# Patient Record
Sex: Female | Born: 1935 | Race: White | Hispanic: No | Smoking: Never smoker
Health system: Southern US, Community
[De-identification: ages and names within clinical notes are randomized; demographics above are authoritative.]

## PROBLEM LIST (undated history)

## (undated) DIAGNOSIS — K635 Polyp of colon: Secondary | ICD-10-CM

## (undated) DIAGNOSIS — I10 Essential (primary) hypertension: Secondary | ICD-10-CM

## (undated) DIAGNOSIS — K859 Acute pancreatitis without necrosis or infection, unspecified: Secondary | ICD-10-CM

## (undated) HISTORY — PX: POLYPECTOMY: SHX149

## (undated) HISTORY — PX: PARTIAL HYSTERECTOMY: SHX80

## (undated) HISTORY — PX: UPPER GASTROINTESTINAL ENDOSCOPY: SHX188

## (undated) HISTORY — DX: Acute pancreatitis without necrosis or infection, unspecified: K85.90

## (undated) HISTORY — PX: COLONOSCOPY: SHX174

## (undated) HISTORY — PX: WRIST FRACTURE SURGERY: SHX121

## (undated) HISTORY — PX: ERCP: SHX60

## (undated) HISTORY — PX: CHOLECYSTECTOMY: SHX55

## (undated) HISTORY — DX: Essential (primary) hypertension: I10

## (undated) HISTORY — DX: Polyp of colon: K63.5

---

## 1994-02-27 DIAGNOSIS — I10 Essential (primary) hypertension: Secondary | ICD-10-CM

## 1994-02-27 HISTORY — DX: Essential (primary) hypertension: I10

## 1997-07-20 ENCOUNTER — Ambulatory Visit (HOSPITAL_COMMUNITY): Admission: RE | Admit: 1997-07-20 | Discharge: 1997-07-20 | Payer: Self-pay | Admitting: Internal Medicine

## 1997-08-11 ENCOUNTER — Ambulatory Visit (HOSPITAL_COMMUNITY): Admission: RE | Admit: 1997-08-11 | Discharge: 1997-08-11 | Payer: Self-pay | Admitting: Family Medicine

## 1998-03-09 ENCOUNTER — Ambulatory Visit (HOSPITAL_COMMUNITY): Admission: RE | Admit: 1998-03-09 | Discharge: 1998-03-09 | Payer: Self-pay | Admitting: Family Medicine

## 1998-04-13 ENCOUNTER — Ambulatory Visit (HOSPITAL_COMMUNITY): Admission: RE | Admit: 1998-04-13 | Discharge: 1998-04-13 | Payer: Self-pay | Admitting: Family Medicine

## 1998-04-13 ENCOUNTER — Encounter: Payer: Self-pay | Admitting: Family Medicine

## 1998-04-22 ENCOUNTER — Ambulatory Visit (HOSPITAL_COMMUNITY): Admission: RE | Admit: 1998-04-22 | Discharge: 1998-04-22 | Payer: Self-pay | Admitting: Family Medicine

## 1998-10-14 ENCOUNTER — Ambulatory Visit (HOSPITAL_COMMUNITY): Admission: RE | Admit: 1998-10-14 | Discharge: 1998-10-14 | Payer: Self-pay | Admitting: Family Medicine

## 1998-10-14 ENCOUNTER — Encounter: Payer: Self-pay | Admitting: Family Medicine

## 1999-03-19 ENCOUNTER — Encounter: Payer: Self-pay | Admitting: Orthopedic Surgery

## 1999-03-19 ENCOUNTER — Encounter: Admission: RE | Admit: 1999-03-19 | Discharge: 1999-03-19 | Payer: Self-pay | Admitting: Orthopedic Surgery

## 1999-10-17 ENCOUNTER — Encounter: Payer: Self-pay | Admitting: Family Medicine

## 1999-10-17 ENCOUNTER — Ambulatory Visit (HOSPITAL_COMMUNITY): Admission: RE | Admit: 1999-10-17 | Discharge: 1999-10-17 | Payer: Self-pay | Admitting: Family Medicine

## 1999-11-04 ENCOUNTER — Other Ambulatory Visit: Admission: RE | Admit: 1999-11-04 | Discharge: 1999-11-04 | Payer: Self-pay | Admitting: Family Medicine

## 2000-10-17 ENCOUNTER — Encounter: Payer: Self-pay | Admitting: Family Medicine

## 2000-10-17 ENCOUNTER — Ambulatory Visit (HOSPITAL_COMMUNITY): Admission: RE | Admit: 2000-10-17 | Discharge: 2000-10-17 | Payer: Self-pay | Admitting: Family Medicine

## 2000-12-10 ENCOUNTER — Other Ambulatory Visit: Admission: RE | Admit: 2000-12-10 | Discharge: 2000-12-10 | Payer: Self-pay | Admitting: Family Medicine

## 2001-12-10 ENCOUNTER — Encounter: Payer: Self-pay | Admitting: Family Medicine

## 2001-12-10 ENCOUNTER — Ambulatory Visit (HOSPITAL_COMMUNITY): Admission: RE | Admit: 2001-12-10 | Discharge: 2001-12-10 | Payer: Self-pay | Admitting: Family Medicine

## 2002-07-16 ENCOUNTER — Encounter: Admission: RE | Admit: 2002-07-16 | Discharge: 2002-07-16 | Payer: Self-pay | Admitting: Family Medicine

## 2002-07-16 ENCOUNTER — Encounter: Payer: Self-pay | Admitting: Family Medicine

## 2002-12-16 ENCOUNTER — Other Ambulatory Visit: Admission: RE | Admit: 2002-12-16 | Discharge: 2002-12-16 | Payer: Self-pay | Admitting: Family Medicine

## 2003-04-03 ENCOUNTER — Emergency Department (HOSPITAL_COMMUNITY): Admission: EM | Admit: 2003-04-03 | Discharge: 2003-04-03 | Payer: Self-pay | Admitting: Emergency Medicine

## 2003-12-14 ENCOUNTER — Ambulatory Visit (HOSPITAL_COMMUNITY): Admission: RE | Admit: 2003-12-14 | Discharge: 2003-12-14 | Payer: Self-pay | Admitting: Radiation Oncology

## 2005-01-06 ENCOUNTER — Ambulatory Visit (HOSPITAL_COMMUNITY): Admission: RE | Admit: 2005-01-06 | Discharge: 2005-01-06 | Payer: Self-pay | Admitting: Family Medicine

## 2005-03-27 ENCOUNTER — Ambulatory Visit: Payer: Self-pay | Admitting: Internal Medicine

## 2005-04-11 ENCOUNTER — Encounter (INDEPENDENT_AMBULATORY_CARE_PROVIDER_SITE_OTHER): Payer: Self-pay | Admitting: *Deleted

## 2005-04-11 ENCOUNTER — Ambulatory Visit: Payer: Self-pay | Admitting: Internal Medicine

## 2006-01-08 ENCOUNTER — Ambulatory Visit (HOSPITAL_COMMUNITY): Admission: RE | Admit: 2006-01-08 | Discharge: 2006-01-08 | Payer: Self-pay | Admitting: Family Medicine

## 2006-11-12 ENCOUNTER — Emergency Department (HOSPITAL_COMMUNITY): Admission: EM | Admit: 2006-11-12 | Discharge: 2006-11-12 | Payer: Self-pay | Admitting: Emergency Medicine

## 2007-01-11 ENCOUNTER — Ambulatory Visit (HOSPITAL_COMMUNITY): Admission: RE | Admit: 2007-01-11 | Discharge: 2007-01-11 | Payer: Self-pay | Admitting: Family Medicine

## 2008-01-14 ENCOUNTER — Ambulatory Visit (HOSPITAL_COMMUNITY): Admission: RE | Admit: 2008-01-14 | Discharge: 2008-01-14 | Payer: Self-pay | Admitting: Family Medicine

## 2008-08-04 ENCOUNTER — Encounter: Admission: RE | Admit: 2008-08-04 | Discharge: 2008-08-04 | Payer: Self-pay | Admitting: Internal Medicine

## 2009-01-14 ENCOUNTER — Ambulatory Visit (HOSPITAL_COMMUNITY): Admission: RE | Admit: 2009-01-14 | Discharge: 2009-01-14 | Payer: Self-pay | Admitting: Internal Medicine

## 2009-05-25 ENCOUNTER — Encounter: Payer: Self-pay | Admitting: Internal Medicine

## 2009-05-25 ENCOUNTER — Ambulatory Visit: Payer: Self-pay | Admitting: Gastroenterology

## 2009-05-25 ENCOUNTER — Inpatient Hospital Stay (HOSPITAL_COMMUNITY): Admission: EM | Admit: 2009-05-25 | Discharge: 2009-05-29 | Payer: Self-pay | Admitting: Emergency Medicine

## 2009-05-26 ENCOUNTER — Encounter: Payer: Self-pay | Admitting: Internal Medicine

## 2009-05-31 ENCOUNTER — Telehealth: Payer: Self-pay | Admitting: Physician Assistant

## 2009-06-01 ENCOUNTER — Ambulatory Visit: Payer: Self-pay | Admitting: Internal Medicine

## 2009-06-01 DIAGNOSIS — I10 Essential (primary) hypertension: Secondary | ICD-10-CM | POA: Insufficient documentation

## 2009-06-01 DIAGNOSIS — E119 Type 2 diabetes mellitus without complications: Secondary | ICD-10-CM | POA: Insufficient documentation

## 2009-06-01 DIAGNOSIS — K859 Acute pancreatitis without necrosis or infection, unspecified: Secondary | ICD-10-CM | POA: Insufficient documentation

## 2009-06-01 DIAGNOSIS — E785 Hyperlipidemia, unspecified: Secondary | ICD-10-CM | POA: Insufficient documentation

## 2009-06-01 DIAGNOSIS — Z860100 Personal history of colon polyps, unspecified: Secondary | ICD-10-CM | POA: Insufficient documentation

## 2009-06-02 ENCOUNTER — Telehealth (INDEPENDENT_AMBULATORY_CARE_PROVIDER_SITE_OTHER): Payer: Self-pay | Admitting: *Deleted

## 2009-06-02 ENCOUNTER — Encounter (INDEPENDENT_AMBULATORY_CARE_PROVIDER_SITE_OTHER): Payer: Self-pay | Admitting: *Deleted

## 2009-06-02 LAB — CONVERTED CEMR LAB
ALT: 61 units/L — ABNORMAL HIGH (ref 0–35)
AST: 25 units/L (ref 0–37)
Albumin: 3.1 g/dL — ABNORMAL LOW (ref 3.5–5.2)
Alkaline Phosphatase: 99 units/L (ref 39–117)
Amylase: 26 units/L — ABNORMAL LOW (ref 27–131)
BUN: 13 mg/dL (ref 6–23)
Basophils Absolute: 0 10*3/uL (ref 0.0–0.1)
Basophils Relative: 0.2 % (ref 0.0–3.0)
CO2: 30 meq/L (ref 19–32)
Calcium: 9 mg/dL (ref 8.4–10.5)
Chloride: 96 meq/L (ref 96–112)
Creatinine, Ser: 1 mg/dL (ref 0.4–1.2)
Eosinophils Absolute: 0.1 10*3/uL (ref 0.0–0.7)
Eosinophils Relative: 1.3 % (ref 0.0–5.0)
GFR calc non Af Amer: 57.5 mL/min (ref >60)
Glucose, Bld: 287 mg/dL — ABNORMAL HIGH (ref 70–99)
HCT: 36.3 % (ref 36.0–46.0)
Hemoglobin: 12.3 g/dL (ref 12.0–15.0)
Lipase: 39 units/L (ref 11.0–59.0)
Lymphocytes Relative: 11.2 % — ABNORMAL LOW (ref 12.0–46.0)
Lymphs Abs: 1.2 10*3/uL (ref 0.7–4.0)
MCHC: 34 g/dL (ref 30.0–36.0)
MCV: 86.9 fL (ref 78.0–100.0)
Monocytes Absolute: 0.7 10*3/uL (ref 0.1–1.0)
Monocytes Relative: 6.5 % (ref 3.0–12.0)
Neutro Abs: 8.9 10*3/uL — ABNORMAL HIGH (ref 1.4–7.7)
Neutrophils Relative %: 80.8 % — ABNORMAL HIGH (ref 43.0–77.0)
Platelets: 339 10*3/uL (ref 150.0–400.0)
Potassium: 3.8 meq/L (ref 3.5–5.1)
RBC: 4.18 M/uL (ref 3.87–5.11)
RDW: 14.6 % (ref 11.5–14.6)
Sodium: 137 meq/L (ref 135–145)
Total Bilirubin: 0.6 mg/dL (ref 0.3–1.2)
Total Protein: 6.7 g/dL (ref 6.0–8.3)
WBC: 11 10*3/uL — ABNORMAL HIGH (ref 4.5–10.5)

## 2009-07-08 ENCOUNTER — Ambulatory Visit: Payer: Self-pay | Admitting: Gastroenterology

## 2009-07-08 ENCOUNTER — Ambulatory Visit (HOSPITAL_COMMUNITY): Admission: RE | Admit: 2009-07-08 | Discharge: 2009-07-08 | Payer: Self-pay | Admitting: Gastroenterology

## 2009-07-13 ENCOUNTER — Encounter: Payer: Self-pay | Admitting: Gastroenterology

## 2009-07-30 ENCOUNTER — Encounter (INDEPENDENT_AMBULATORY_CARE_PROVIDER_SITE_OTHER): Payer: Self-pay | Admitting: Surgery

## 2009-07-30 ENCOUNTER — Ambulatory Visit (HOSPITAL_COMMUNITY): Admission: RE | Admit: 2009-07-30 | Discharge: 2009-07-31 | Payer: Self-pay | Admitting: Surgery

## 2010-01-17 ENCOUNTER — Ambulatory Visit (HOSPITAL_COMMUNITY)
Admission: RE | Admit: 2010-01-17 | Discharge: 2010-01-17 | Payer: Self-pay | Source: Home / Self Care | Admitting: Family Medicine

## 2010-03-31 NOTE — Progress Notes (Signed)
Summary: triage  Phone Note Call from Patient Call back at Home Phone [phone]   Caller: Patient Call For: Mike Gip Reason for Call: Talk to Nurse Summary of Call: pt would like to sch an appt with Mike Gip for inflamed pancreas/hosp f/u Initial call taken by: Vallarie Mare,  May 31, 2009 8:43 AM  Follow-up for Phone Call        We scheduled the pt for TUes 06-01-09 at 10:30AM. per Amy Esterwood PA-C.  Pt informed, Amy spoke to pt on the phone. Follow-up by: Joselyn Glassman,  May 31, 2009 2:44 PM

## 2010-03-31 NOTE — Letter (Signed)
Summary: EGD Instructions  [facility]  [address] [place], [place] [address]   Phone: [phone]  Fax: [phone]       [name] [name]    May 21, 1932    MRN: [id number]       Procedure Day /Date:07/08/09  [name] [name]     Arrival Time: 6 am     Procedure Time:8 am     Location of Procedure:                      Please arrive at [facility] on 07/07/09 for preop at 1 pm.   X [facility] ( Outpatient Registration)    PREPARATION FOR ENDOSCOPY   On 07/08/09 THE DAY OF THE PROCEDURE:  1.   Nothing to eat or drink allowed after midnight the night before your procedure.    Unless otherwise instructed, you should take regular prescription medications with a small sip of water as early as possible the morning of your procedure.  Diabetic patients - see separate instructions.               OTHER INSTRUCTIONS  You will need a responsible adult at least 75 years of age to accompany you and drive you home.   This person must remain in the waiting room during your procedure.  Wear loose fitting clothing that is easily removed.  Leave jewelry and other valuables at home.  However, you may wish to bring a book to read or an iPod/MP3 player to listen to music as you wait for your procedure to start.  Remove all body piercing jewelry and leave at home.  Total time from sign-in until discharge is approximately 2-3 hours.  You should go home directly after your procedure and rest.  You can resume normal activities the day after your procedure.  The day of your procedure you should not:   Drive   Make legal decisions   Operate machinery   Drink alcohol   Return to work  You will receive specific instructions about eating, activities and medications before you leave.    The above instructions have been reviewed and explained to me by   Chales Abrahams CMA Duncan Dull)  June 02, 2009 8:17 AM      I fully understand and can verbalize these  instructions over the phone mailed to [facility] 06/02/09

## 2010-03-31 NOTE — Procedures (Signed)
Summary: CT ABD & PELVIS   CT Abdomen/Pelvis  Procedure date:  05/26/2009  Findings:      CT ABD & PELVIS 05-26-09 CT Abd/Pelvis WO/W CM - STATUS: Final  IMAGE                                     Perform Date: 30Mar11 14:32  Ordered By: Dennard Nip,         Ordered Date: [phone] 14:24  Facility: [facility]                              Department: CT  Service Report Text  [facility] Accession Number: [id number]      Clinical Data: Acute pancreatitis.  Cholelithiasis.  Prominent   pancreatic duct on ultrasound yesterday.    CT ABDOMEN AND PELVIS WITHOUT AND WITH CONTRAST    Technique:  Multidetector CT imaging of the abdomen and pelvis was   performed without contrast material in one or both body regions,   followed by contrast material(s) and further sections in one or   both body regions.    Contrast: 100 ml Omnipaque-300    Comparison: Ultrasound from 05/25/2009    Findings: Tiny bilateral pleural effusions noted.  The liver is   normal.  No intrahepatic biliary duct dilatation.  The spleen has   normal imaging features.  Stomach is unremarkable.  9 mm stone   identified in the gallbladder.  Extrahepatic common duct is   nondilated at 5 mm diameter level pancreatic head.    The main pancreatic duct is slightly prominent the pancreatic head   measuring 4 mm in diameter.  There is fullness of the uncinate   process and pancreatic head without a discrete mass visualized.   Edema/inflammation is noted around the pancreatic head and uncinate   process and edema is seen intercalated within the pancreatic   parenchyma.  No evidence for pseudocyst or abscess.  Superior   mesenteric vein, portal vein, and splenic vein are patent.    No adrenal mass.  Left kidney is normal.  Small cortical cyst noted   in the upper pole of the right kidney.    Bowel loops have normal imaging features.  No abdominal aortic   aneurysm.  No lymphadenopathy is identified in the abdomen or   pelvis.   Bladder is normal.  Uterus is surgically absent.  No   adnexal mass.  No evidence for diverticulitis.  Terminal ileum is   normal.  The appendix is normal.    Bone windows reveal no worrisome lytic or sclerotic osseous   lesions.    IMPRESSION:   Fullness in the pancreatic head and uncinate process is felt to be   edema related to acute pancreatitis.  There is no biliary   dilatation although the main pancreatic duct in the head of the   pancreas is slightly prominent.  No evidence for pseudocyst or   abscess at this time and no vascular complication is evident.   Consider repeat imaging after resolution of acute symptoms to   reassess the pancreatic head.    Read By:  Kennith Center,  M.D.   Released By:  Kennith Center,  M.D.  Additional Information  HL7 RESULT STATUS : F  External image : [id number]  External IF Update Timestamp : 2009-05-26:15:37:11.000000

## 2010-03-31 NOTE — Procedures (Signed)
Summary: ABD [place]   [place] of Abdomen  Procedure date:  05/25/2009  Findings:      ABD [place] 05/25/2009 US Abdomen Complete - STATUS: Final  IMAGE                                     Perform Date: 29Mar11 08:19  Ordered By: Olga Millers Date: 29Mar11 07:23  Facility: [facility]                              Department: [place]  Service Report Text  [facility] Accession Number: [id number]      Clinical Data:  Abdominal pain and nausea and vomiting.    COMPLETE ABDOMINAL ULTRASOUND    Comparison:  None.    Findings:    Gallbladder:  1.4 cm gallstone.  This changes slightly with change   of patient position.  No pericholecystic fluid noted.  Gallbladder   wall 2.2 mm.  The patient was not tender over this region during   scanning however, the patient was medicated and therefore this may   not be accurate.    Common bile duct:  6.3 mm.    Liver:  Diffuse increased echogenicity suggestive of fatty   infiltration.  Evaluation limited.    IVC:  Negative.    Pancreas:  Pancreatic duct is prominent at 3.9 mm with abrupt cut   off in the pancreatic neck region.  A discrete pancreatic mass is   not visualized at this level although evaluation is limited by   bowel gas and therefore underlying pancreatic lesion cannot be   excluded.    Spleen:  Small and difficult to assess measuring up to 3.6 cm in   length.    Right Kidney:  11.3 cm. No hydronephrosis or renal mass.    Left Kidney:  10.4 cm.  Dromedary hump. No hydronephrosis or renal   mass.    Abdominal aorta:  Evaluation is slightly limited by bowel gas.  No   abdominal aortic aneurysm detected of the visualized portions of   the aorta with maximal AP dimension 1.7 cm.    IMPRESSION:   1.4 cm gallstone.  No pericholecystic fluid or gallbladder wall   thickening.    Prominent pancreatic duct.  Although a discrete pancreatic mass as   cause of the pancreas duct prominence is not identified, evaluation   is limited  because of overlying bowel gas.    Fatty liver.  No obvious mass although evaluation limited given the   degree of increased echogenicity of the liver.    Read By:  Fuller Canada,  M.D.   Released By:  Fuller Canada,  M.D.  Additional Information  HL7 RESULT STATUS : F  External image : [id number]  External IF Update Timestamp : 2009-05-25:08:33:01.000000

## 2010-03-31 NOTE — Letter (Signed)
Summary: [facility]   Imported By: Sherian Rein 08/02/2009 14:07:51  _____________________________________________________________________  External Attachment:    Type:   Image     Comment:   External Document

## 2010-03-31 NOTE — Assessment & Plan Note (Signed)
Summary: F/U Post hospital, Pancreatitis   History of Present Illness Visit Type: Follow-up Visit Primary GI MD: Lina Sar MD Primary Provider: Jacqulyn Cane MD Chief Complaint: post hosp f/u pancreatitis History of Present Illness:   PLEASANT 77 Y.O FEMALE WHO WAS JUST HOSPITALIZED LAST WEEK AT [facility], AND EVALUATED  BY DR. KAPLAN AND DR Gerrit Friends. SHE HAD ACUTE PANCREATITIS,ETIOLOGY NOT ENTIRELY CLEAR. SHE DOES HAVE GALLSTONES -MAY HAVE PASSED A STONE THOUGH ALL STUDIES NOT CONSISTENT. SHE ALSO HAS AN ABNORMAL [place] AND CT SHOWING ENLARGEMENT OF THE PANREATIC DUCT, AND AN ABRUPT CUT OFF OF DUCT IN THE PANCREATIC HEAD. CA19-9 MILDLY ELEVATED IN 47 RANGE. SHE RECOVERED SLOWLY AND DECISION MADE TO WAIT ON SURGERY , AND FURTHER STUDIES UNTIL PANCREATITIS COMPLETELY RESOLVED- THEN EUS/?MRCP. SHE CALLED YESTERDAY , NOT FEELING AS WELL, AND UPSET AS SHE HAD BEEN DISCHARGED BY SURGEON OVER THE WEEKEND SOONER THAN EXPECTED. SHE IS REQUIRING INTERMITTENT VICODEN,STILL HAS SOME PAIN THOUGH RATES IT AS 3-4/10. +NAUSEA, NO VOMITING, NOT MUCH APPETITE BUT EATING.HAS HAD SOME LOW GRADE FEVERS ,99 RANGE.BMS NORMAL. NO SOB.   GI Review of Systems    Reports abdominal pain, loss of appetite, and  nausea.     Location of  Abdominal pain: upper abdomen.    Denies acid reflux, belching, bloating, chest pain, dysphagia with liquids, dysphagia with solids, heartburn, vomiting, vomiting blood, and  weight loss.        Denies anal fissure, black tarry stools, change in bowel habit, constipation, diarrhea, diverticulosis, fecal incontinence, heme positive stool, hemorrhoids, irritable bowel syndrome, jaundice, light color stool, liver problems, rectal bleeding, and  rectal pain. Preventive Screening-Counseling & Management  Alcohol-Tobacco     Smoking Status: never  Caffeine-Diet-Exercise     Does Patient Exercise: no      Drug Use:  no.      Current Medications (verified): 1)  Glucotrol Xl 10 Mg Xr24h-Tab  (Glipizide) .Marland Kitchen.. 1 By Mouth Two Times A Day 2)  Glucophage 1000 Mg Tabs (Metformin Hcl) .Marland Kitchen.. 1 By Mouth Two Times A Day 3)  Lantus 100 Unit/ml Soln (Insulin Glargine) .... 23 Units At Bedtime 4)  Pravastatin Sodium 20 Mg Tabs (Pravastatin Sodium) .Marland Kitchen.. 1 By Mouth Once Daily 5)  Diltzac 360 Mg Xr24h-Cap (Diltiazem Hcl Er Beads) .Marland Kitchen.. 1 By Mouth Once Daily 6)  Accupril 20 Mg Tabs (Quinapril Hcl) .Marland Kitchen.. 1 By Mouth Two Times A Day 7)  Furosemide 20 Mg Tabs (Furosemide) .Marland Kitchen.. 1 By Mouth Once Daily 8)  Aspir-Low 81 Mg Tbec (Aspirin) .Marland Kitchen.. 1 By Mouth Once Daily 9)  Citracal Petites/vitamin D 200-250 Mg-Unit Tabs (Calcium Citrate-Vitamin D) .Marland Kitchen.. 1 By Mouth Two Times A Day 10)  Hydrocodone-Acetaminophen 5-500 Mg Tabs (Hydrocodone-Acetaminophen) .... As Needed  Allergies (verified): No Known Drug Allergies  Past History:  Past Medical History: Diabetes Hyperlipidemia Hypertension ACUTE PANCREATITIS 3/11 CHOLELITHIASIS  Past Surgical History: Hysterectomy wrist surgery  Family History: Lung Cancer:  Brother  No FH of Colon Cancer: Family History of Heart Disease: brother Family History of Diabetes: brother Family History of Kidney Disease:mother  Social History: Patient has never smoked. ,WIDOWED THIS PAST YEAR Alcohol Use - no Daily Caffeine Use 2 per day Illicit Drug Use - no Patient does not get regular exercise.  Smoking Status:  never Drug Use:  no Does Patient Exercise:  no  Review of Systems  The patient denies allergy/sinus, anemia, anxiety-new, arthritis/joint pain, back pain, blood in urine, breast changes/lumps, change in vision, confusion, cough, coughing up blood, depression-new, fainting,  fatigue, fever, headaches-new, hearing problems, heart murmur, heart rhythm changes, itching, menstrual pain, muscle pains/cramps, night sweats, nosebleeds, pregnancy symptoms, shortness of breath, skin rash, sleeping problems, sore throat, swelling of feet/legs, swollen lymph glands,  thirst - excessive , urination - excessive , urination changes/pain, urine leakage, vision changes, and voice change.         ROS OTHERWISE AS IN HPI  Vital Signs:  Patient profile:   77 year old female Height:      66 inches Weight:      183 pounds BMI:     29.64 Pulse rate:   70 / minute Pulse rhythm:   regular BP sitting:   120 / 44  (left arm)  Vitals Entered By: Chales Abrahams CMA Duncan Dull) (June 01, 2009 10:38 AM)  Physical Exam  General:  Well developed, well nourished, no acute distress. Head:  Normocephalic and atraumatic. Eyes:  PERRLA, no icterus. Lungs:  Clear throughout to auscultation. Heart:  Regular rate and rhythm; no murmurs, rubs,  or bruits. Abdomen:  SOFT, MILD TENDERNESS UPPER ABDOMEN, NO GUARDING, NO REBOUND, BS+ Rectal:  NOT DONE Extremities:  1+ pedal edema.   Neurologic:  Alert and  oriented x4;  grossly normal neurologically. Psych:  Alert and cooperative. Normal mood and affect.anxious.     Impression & Recommendations:  Problem # 1:  ACUTE PANCREATITS 577.0 Assessment New 76 YO FEMALE WITH RESOLVING ACUTE PANCREATITIS,POSSIBLY BILIARY THOUGH CONCERNED ABOUT SUBTLE PANCREATIC HEAD NEOPLASM.  LABS TODAY AS BELOW CONTINUE LOW FAT DIET DISCUSSED EUS AND ERCP WITH PT AND HER SISTER. THIS HAD ASLO BEEN DISCUSSED DURING HER HOSPITALIZATION. WILL DISCUSS WITH DR. Christella Hartigan AND SCHEDULE FOR 3-4 WEEKS FORM NOW.  Problem # 2:  CHOLELITHIASIS (ICD-574.2) Assessment: Comment Only  Problem # 3:  HYPERTENSION (ICD-401.9) Assessment: Comment Only  Problem # 4:  HYPERLIPIDEMIA (ICD-272.4) Assessment: Comment Only  Problem # 5:  DIABETES MELLITUS-TYPE II (ICD-250.00) Assessment: Comment Only  Problem # 6:  PERSONAL HX COLONIC POLYPS (ICD-V12.72) Assessment: Comment Only ADENOMATOUS,DUE FOR FOLLOW UP 03/2010  Other Orders: TLB-CBC Platelet - w/Differential (85025-CBCD) TLB-CMP (Comprehensive Metabolic Pnl) (80053-COMP) TLB-Amylase (82150-AMYL) TLB-Lipase  (83690-LIPASE)  Patient Instructions: 1)  Your physician has requested that you have the following labwork done today: Go to our basement level. 2)  Patty will be calling you with an appointment for a procedure with Dr. Rob Bunting. 3)  Copy sent to : Kern Reap, MD 4)  The medication list was reviewed and reconciled.  All changed / newly prescribed medications were explained.  A complete medication list was provided to the patient / caregiver.  Appended Document: F/U Post hospital, Pancreatitis i reviewed CT images, [place] report.  recent labs while in hosp.  Looks like biliary pancreatitis (transaminases had signficant, transient elevation; amylase >2000 at one point).  There is a large gallstone in GB, perhaps smaller sludge, stone debris caused the pancreaittis. Assessing for a mass lesion in setting of acute pancreaitis is difficult by [place] and CT and so I think proceed with EUS +/- ERCP in 4-5 weeks is best plan.  I will arrange for this.  patty, can you set her up with 90 min upper EUS, radial; +/- ERCP.  Will need propofol sedation, thursday May 12th, diagnosis recent acute pancreatitis, abn pancreas on imaging.  Appended Document: F/U Post hospital, Pancreatitis pt appt scheduled

## 2010-03-31 NOTE — Progress Notes (Signed)
Summary: EUS  Phone Note Outgoing Call Call back at Home Phone [phone]   Call placed by: Chales Abrahams CMA Duncan Dull),  June 02, 2009 8:12 AM Summary of Call: EUS ERCP scheduled review meds instruct pt.  pt to have preop 07/07/09  1 pm  Initial call taken by: Chales Abrahams CMA Duncan Dull),  June 02, 2009 8:13 AM  Follow-up for Phone Call        I gave pt her lab results and let her know about the Preop appt for the EUS, she is to be @ [facility] on 07-07-09 at 1:00 PM.  The EUS is scheduled for 07-08-09 and she is to be there at 6:00 Am.  I advised her Chales Abrahams CMA will be mailing her instructions to her for this procedure.  Follow-up by: Joselyn Glassman,  June 02, 2009 11:48 AM

## 2010-03-31 NOTE — Letter (Signed)
Summary: Diabetic Instructions  [facility]  [address] [place], [place] [address]   Phone: [phone]  Fax: [phone]    [name] [name] Apr 12, 1932 MRN: [id number]   X   ORAL DIABETIC MEDICATION INSTRUCTIONS  The day before your procedure:   Take your diabetic pill as you do normally  The day of your procedure:   Do not take your diabetic pill    We will check your blood sugar levels during the admission process and again in Recovery before discharging you home  ________________________________________________________________________  X   INSULIN (LONG ACTING) MEDICATION INSTRUCTIONS (Lantus, NPH, 70/30, Humulin, Novolin-N)   The day before your procedure:   Take  your regular evening dose    The day of your procedure:   Do not take your morning dose

## 2010-03-31 NOTE — Procedures (Signed)
Summary: Endoscopic Ultrasound  Patient: [name] Note: All result statuses are Final unless otherwise noted.  Tests: (1) Endoscopic Ultrasound (EUS)  EUS Endoscopic Ultrasound                             DONE     [facility]     [address] [place], [place]  [address]           ENDOSCOPIC ULTRASOUND PROCEDURE REPORT           PATIENT:  [name] [name], [name] [name]  MR#:  [id number]     BIRTHDATE:  1932-11-16  GENDER:  female     ENDOSCOPIST:  Rachael Fee, MD     REFERRED BY:  Barbette Hair. Arlyce Dice, M.D.     PROCEDURE DATE:  07/08/2009     PROCEDURE:  Upper EUS     ASA CLASS:  Class II     INDICATIONS:  recent acute pancreatitis; gallstone in GB on [place]; CT     showed "fullness in head/uncinate.slightly dilated PD," seen by     surgery (DR. Gerkin) and GI (Dr. Arlyce Dice) in hospital     MEDICATIONS:  MAC sedation, administered by CRNA           DESCRIPTION OF PROCEDURE:   After the risks, benefits, and     alternatives of the procedure were thoroughly explained, informed     consent was obtained.  The  endoscope was introduced through the     mouth and advanced to the duodenum.     <<PROCEDUREIMAGES>>           Endoscopic findings (limited views with radial echoendoscope):     1. Normal esophagus     2. Normal stomach     3. Normal duodenum           EUS findings:     1. CBD was normal, non-dilated and contained no filling defects     2. Main pancreatic duct slightly dilated in head, tapers normally     throughout neck, body, tail.     3. Normal pancreatic parenchyma; no masses, no signs of chronic     pancreatitis.     4. No peripancreatic or celiac adenopathy.     5. Gallbladder contained one 11mm, hyperechoic, shadowing stone.     6. Limited views of liver, spleen, portal and splenic vessels were     all normal           Impression:     No CBD stones.  + 11mm shadowing stone in gallbladder.     My office will make sure she returns to see Dr. Darnell Level,  whom     she saw while in hospital 1 month ago, to consider     cholecystectomy.           ______________________________     Rachael Fee, MD           cc: Darnell Level, MD           n.     Rosalie Doctor:   Rachael Fee at 07/08/2009 08:43 AM           Darnelle Bos, [id number]  Note: An exclamation mark (!) indicates a result that was not dispersed into the flowsheet. Document Creation Date: 07/08/2009 8:43 AM _______________________________________________________________________  (1) Order result status: Final Collection or observation date-time: 07/08/2009 08:31 Requested date-time:  Receipt  date-time:  Reported date-time:  Referring Physician:   Ordering Physician: Rob Bunting [phone]) Specimen Source:  Source: Launa Grill Order Number: [id number] Lab site:   Appended Document: Endoscopic Ultrasound patty,  can you make sure that she has rov with Dr. Darnell Level at [facility] (he saw her in hospital), needs to consider cholecystectomy.  Appended Document: Endoscopic Ultrasound spoke with Darel Hong at [facility] and the pt scheduled her appt for the 17th she is aware.

## 2010-04-14 ENCOUNTER — Encounter: Payer: Self-pay | Admitting: Internal Medicine

## 2010-04-20 NOTE — Letter (Signed)
Summary: Colonoscopy Letter  [facility]  [address] [place], [place] [address]   Phone: [phone]  Fax: [phone]      April 14, 2010 MRN: [id number]   [name] [name] [address] [place], [place]  [address]   Dear Ms. Sharber,   According to your medical record, it is time for you to schedule a Colonoscopy. The American Cancer Society recommends this procedure as a method to detect early colon cancer. Patients with a family history of colon cancer, or a personal history of colon polyps or inflammatory bowel disease are at increased risk.  This letter has been generated based on the recommendations made at the time of your procedure. If you feel that in your particular situation this may no longer apply, please contact our office.  Please call our office at [phone] to schedule this appointment or to update your records at your earliest convenience.  Thank you for cooperating with [place] to provide you with the very best care possible.   Sincerely,  Hedwig Morton. Juanda Chance, M.D.  [facility] Gastroenterology Division [phone]

## 2010-04-25 ENCOUNTER — Encounter (INDEPENDENT_AMBULATORY_CARE_PROVIDER_SITE_OTHER): Payer: Self-pay | Admitting: *Deleted

## 2010-05-05 NOTE — Letter (Signed)
Summary: Pre Visit Letter Revised  [facility]  [address] [place], [place] [address]   Phone: [phone]  Fax: [phone]        04/25/2010 MRN: [id number] [name] [name] [address] [place], [place]  [address]             Procedure Date:  05-31-10           Recall Colon--Dr. Juanda Chance      Welcome to the Gastroenterology Division at [facility].    You are scheduled to see a nurse for your pre-procedure visit on 05-17-10 at 1:00P.M. on the 3rd floor at [facility], [address]  We ask that you try to arrive at our office 15 minutes prior to your appointment time to allow for check-in.  Please take a minute to review the attached form.  If you answer "Yes" to one or more of the questions on the first page, we ask that you call the person listed at your earliest opportunity.  If you answer "No" to all of the questions, please complete the rest of the form and bring it to your appointment.    Your nurse visit will consist of discussing your medical and surgical history, your immediate family medical history, and your medications.   If you are unable to list all of your medications on the form, please bring the medication bottles to your appointment and we will list them.  We will need to be aware of both prescribed and over the counter drugs.  We will need to know exact dosage information as well.    Please be prepared to read and sign documents such as consent forms, a financial agreement, and acknowledgement forms.  If necessary, and with your consent, a friend or relative is welcome to sit-in on the nurse visit with you.  Please bring your insurance card so that we may make a copy of it.  If your insurance requires a referral to see a specialist, please bring your referral form from your primary care physician.  No co-pay is required for this nurse visit.     If you cannot keep your appointment, please call [phone] to cancel or reschedule prior to  your appointment date.  This allows [place] the opportunity to schedule an appointment for another patient in need of care.    Thank you for choosing [facility] for your medical needs.  We appreciate the opportunity to care for you.  Please visit [place] at our website  to learn more about our practice.  Sincerely, The Gastroenterology Division

## 2010-05-16 LAB — COMPREHENSIVE METABOLIC PANEL
ALT: 13 U/L (ref 0–35)
AST: 16 U/L (ref 0–37)
Albumin: 3.9 g/dL (ref 3.5–5.2)
Alkaline Phosphatase: 67 U/L (ref 39–117)
BUN: 18 mg/dL (ref 6–23)
CO2: 31 mEq/L (ref 19–32)
Calcium: 9 mg/dL (ref 8.4–10.5)
Chloride: 99 mEq/L (ref 96–112)
Creatinine, Ser: 0.87 mg/dL (ref 0.4–1.2)
GFR calc Af Amer: >60 mL/min (ref >60)
GFR calc non Af Amer: >60 mL/min (ref >60)
Glucose, Bld: 226 mg/dL — ABNORMAL HIGH (ref 70–99)
Potassium: 3.9 mEq/L (ref 3.5–5.1)
Sodium: 139 mEq/L (ref 135–145)
Total Bilirubin: 0.4 mg/dL (ref 0.3–1.2)
Total Protein: 6.9 g/dL (ref 6.0–8.3)

## 2010-05-16 LAB — GLUCOSE, CAPILLARY
Glucose-Capillary: 182 mg/dL — ABNORMAL HIGH (ref 70–99)
Glucose-Capillary: 186 mg/dL — ABNORMAL HIGH (ref 70–99)
Glucose-Capillary: 191 mg/dL — ABNORMAL HIGH (ref 70–99)
Glucose-Capillary: 223 mg/dL — ABNORMAL HIGH (ref 70–99)
Glucose-Capillary: 248 mg/dL — ABNORMAL HIGH (ref 70–99)
Glucose-Capillary: 272 mg/dL — ABNORMAL HIGH (ref 70–99)

## 2010-05-16 LAB — URINALYSIS, ROUTINE W REFLEX MICROSCOPIC
Bilirubin Urine: NEGATIVE
Glucose, UA: NEGATIVE mg/dL
Hgb urine dipstick: NEGATIVE
Ketones, ur: NEGATIVE mg/dL
Nitrite: NEGATIVE
Protein, ur: NEGATIVE mg/dL
Specific Gravity, Urine: 1.01 (ref 1.005–1.030)
Urobilinogen, UA: 0.2 mg/dL (ref 0.0–1.0)
pH: 5.5 (ref 5.0–8.0)

## 2010-05-16 LAB — DIFFERENTIAL
Basophils Absolute: 0 10*3/uL (ref 0.0–0.1)
Basophils Relative: 0 % (ref 0–1)
Eosinophils Absolute: 0.1 10*3/uL (ref 0.0–0.7)
Eosinophils Relative: 1 % (ref 0–5)
Lymphocytes Relative: 15 % (ref 12–46)
Lymphs Abs: 1.5 10*3/uL (ref 0.7–4.0)
Monocytes Absolute: 0.7 10*3/uL (ref 0.1–1.0)
Monocytes Relative: 7 % (ref 3–12)
Neutro Abs: 7.3 10*3/uL (ref 1.7–7.7)
Neutrophils Relative %: 76 % (ref 43–77)

## 2010-05-16 LAB — CBC
HCT: 40.1 % (ref 36.0–46.0)
Hemoglobin: 13 g/dL (ref 12.0–15.0)
MCHC: 32.4 g/dL (ref 30.0–36.0)
MCV: 88.8 fL (ref 78.0–100.0)
Platelets: 273 10*3/uL (ref 150–400)
RBC: 4.52 MIL/uL (ref 3.87–5.11)
RDW: 14.6 % (ref 11.5–15.5)
WBC: 9.6 10*3/uL (ref 4.0–10.5)

## 2010-05-16 LAB — PROTIME-INR
INR: 0.97 (ref 0.00–1.49)
Prothrombin Time: 12.8 seconds (ref 11.6–15.2)

## 2010-05-17 ENCOUNTER — Ambulatory Visit (AMBULATORY_SURGERY_CENTER): Payer: Medicare Other

## 2010-05-17 VITALS — Ht 66.0 in | Wt 189.5 lb

## 2010-05-17 DIAGNOSIS — Z1211 Encounter for screening for malignant neoplasm of colon: Secondary | ICD-10-CM

## 2010-05-17 LAB — GLUCOSE, CAPILLARY: Glucose-Capillary: 238 mg/dL — ABNORMAL HIGH (ref 70–99)

## 2010-05-17 MED ORDER — PEG-KCL-NACL-NASULF-NA ASC-C 100 G PO SOLR: 1.0000 | Freq: Once | ORAL | Status: AC

## 2010-05-17 NOTE — Patient Instructions (Signed)
Pt to pick up Moviprep with in 5 days

## 2010-05-17 NOTE — Progress Notes (Deleted)
  Subjective:    Patient ID: [name] [name], female    DOB: [birth year], 77 y.o.   MRN: [id number]  HPI    Review of Systems     Objective:   Physical Exam        Assessment & Plan:

## 2010-05-18 LAB — GLUCOSE, CAPILLARY
Glucose-Capillary: 223 mg/dL — ABNORMAL HIGH (ref 70–99)
Glucose-Capillary: 230 mg/dL — ABNORMAL HIGH (ref 70–99)
Glucose-Capillary: 238 mg/dL — ABNORMAL HIGH (ref 70–99)
Glucose-Capillary: 269 mg/dL — ABNORMAL HIGH (ref 70–99)
Glucose-Capillary: 273 mg/dL — ABNORMAL HIGH (ref 70–99)
Glucose-Capillary: 287 mg/dL — ABNORMAL HIGH (ref 70–99)
Glucose-Capillary: 297 mg/dL — ABNORMAL HIGH (ref 70–99)
Glucose-Capillary: 323 mg/dL — ABNORMAL HIGH (ref 70–99)

## 2010-05-18 LAB — COMPREHENSIVE METABOLIC PANEL
ALT: 140 U/L — ABNORMAL HIGH (ref 0–35)
ALT: 220 U/L — ABNORMAL HIGH (ref 0–35)
AST: 18 U/L (ref 0–37)
AST: 34 U/L (ref 0–37)
Albumin: 2.7 g/dL — ABNORMAL LOW (ref 3.5–5.2)
Albumin: 3 g/dL — ABNORMAL LOW (ref 3.5–5.2)
Alkaline Phosphatase: 102 U/L (ref 39–117)
Alkaline Phosphatase: 127 U/L — ABNORMAL HIGH (ref 39–117)
BUN: 5 mg/dL — ABNORMAL LOW (ref 6–23)
BUN: 7 mg/dL (ref 6–23)
CO2: 26 mEq/L (ref 19–32)
CO2: 29 mEq/L (ref 19–32)
Calcium: 7.8 mg/dL — ABNORMAL LOW (ref 8.4–10.5)
Calcium: 7.9 mg/dL — ABNORMAL LOW (ref 8.4–10.5)
Chloride: 97 mEq/L (ref 96–112)
Chloride: 98 mEq/L (ref 96–112)
Creatinine, Ser: 0.72 mg/dL (ref 0.4–1.2)
Creatinine, Ser: 0.8 mg/dL (ref 0.4–1.2)
GFR calc Af Amer: >60 mL/min (ref >60)
GFR calc Af Amer: >60 mL/min (ref >60)
GFR calc non Af Amer: >60 mL/min (ref >60)
GFR calc non Af Amer: >60 mL/min (ref >60)
Glucose, Bld: 260 mg/dL — ABNORMAL HIGH (ref 70–99)
Glucose, Bld: 288 mg/dL — ABNORMAL HIGH (ref 70–99)
Potassium: 3.6 mEq/L (ref 3.5–5.1)
Potassium: 3.6 mEq/L (ref 3.5–5.1)
Sodium: 131 mEq/L — ABNORMAL LOW (ref 135–145)
Sodium: 134 mEq/L — ABNORMAL LOW (ref 135–145)
Total Bilirubin: 1.6 mg/dL — ABNORMAL HIGH (ref 0.3–1.2)
Total Bilirubin: 1.6 mg/dL — ABNORMAL HIGH (ref 0.3–1.2)
Total Protein: 6 g/dL (ref 6.0–8.3)
Total Protein: 6.3 g/dL (ref 6.0–8.3)

## 2010-05-18 LAB — CBC
HCT: 35 % — ABNORMAL LOW (ref 36.0–46.0)
HCT: 35.1 % — ABNORMAL LOW (ref 36.0–46.0)
Hemoglobin: 11.5 g/dL — ABNORMAL LOW (ref 12.0–15.0)
Hemoglobin: 11.6 g/dL — ABNORMAL LOW (ref 12.0–15.0)
MCHC: 32.9 g/dL (ref 30.0–36.0)
MCHC: 33 g/dL (ref 30.0–36.0)
MCV: 88.1 fL (ref 78.0–100.0)
MCV: 88.5 fL (ref 78.0–100.0)
Platelets: 210 10*3/uL (ref 150–400)
Platelets: 227 10*3/uL (ref 150–400)
RBC: 3.96 MIL/uL (ref 3.87–5.11)
RBC: 3.97 MIL/uL (ref 3.87–5.11)
RDW: 14.2 % (ref 11.5–15.5)
RDW: 14.6 % (ref 11.5–15.5)
WBC: 12.6 10*3/uL — ABNORMAL HIGH (ref 4.0–10.5)
WBC: 15.1 10*3/uL — ABNORMAL HIGH (ref 4.0–10.5)

## 2010-05-18 LAB — LIPASE, BLOOD: Lipase: 32 U/L (ref 11–59)

## 2010-05-23 LAB — CBC
HCT: 33.3 % — ABNORMAL LOW (ref 36.0–46.0)
HCT: 36.7 % (ref 36.0–46.0)
HCT: 39.8 % (ref 36.0–46.0)
Hemoglobin: 11 g/dL — ABNORMAL LOW (ref 12.0–15.0)
Hemoglobin: 12 g/dL (ref 12.0–15.0)
Hemoglobin: 13.1 g/dL (ref 12.0–15.0)
MCHC: 32.6 g/dL (ref 30.0–36.0)
MCHC: 33 g/dL (ref 30.0–36.0)
MCHC: 33 g/dL (ref 30.0–36.0)
MCV: 87.4 fL (ref 78.0–100.0)
MCV: 89 fL (ref 78.0–100.0)
MCV: 89.2 fL (ref 78.0–100.0)
Platelets: 191 10*3/uL (ref 150–400)
Platelets: 256 10*3/uL (ref 150–400)
Platelets: 308 10*3/uL (ref 150–400)
RBC: 3.74 MIL/uL — ABNORMAL LOW (ref 3.87–5.11)
RBC: 4.12 MIL/uL (ref 3.87–5.11)
RBC: 4.55 MIL/uL (ref 3.87–5.11)
RDW: 13.3 % (ref 11.5–15.5)
RDW: 14.4 % (ref 11.5–15.5)
RDW: 14.6 % (ref 11.5–15.5)
WBC: 13.2 10*3/uL — ABNORMAL HIGH (ref 4.0–10.5)
WBC: 17.6 10*3/uL — ABNORMAL HIGH (ref 4.0–10.5)
WBC: 18.1 10*3/uL — ABNORMAL HIGH (ref 4.0–10.5)

## 2010-05-23 LAB — GLUCOSE, CAPILLARY
Glucose-Capillary: 221 mg/dL — ABNORMAL HIGH (ref 70–99)
Glucose-Capillary: 243 mg/dL — ABNORMAL HIGH (ref 70–99)
Glucose-Capillary: 255 mg/dL — ABNORMAL HIGH (ref 70–99)
Glucose-Capillary: 260 mg/dL — ABNORMAL HIGH (ref 70–99)
Glucose-Capillary: 262 mg/dL — ABNORMAL HIGH (ref 70–99)
Glucose-Capillary: 275 mg/dL — ABNORMAL HIGH (ref 70–99)
Glucose-Capillary: 282 mg/dL — ABNORMAL HIGH (ref 70–99)
Glucose-Capillary: 284 mg/dL — ABNORMAL HIGH (ref 70–99)
Glucose-Capillary: 285 mg/dL — ABNORMAL HIGH (ref 70–99)
Glucose-Capillary: 290 mg/dL — ABNORMAL HIGH (ref 70–99)
Glucose-Capillary: 294 mg/dL — ABNORMAL HIGH (ref 70–99)
Glucose-Capillary: 295 mg/dL — ABNORMAL HIGH (ref 70–99)
Glucose-Capillary: 309 mg/dL — ABNORMAL HIGH (ref 70–99)
Glucose-Capillary: 312 mg/dL — ABNORMAL HIGH (ref 70–99)
Glucose-Capillary: 389 mg/dL — ABNORMAL HIGH (ref 70–99)

## 2010-05-23 LAB — COMPREHENSIVE METABOLIC PANEL
ALT: 295 U/L — ABNORMAL HIGH (ref 0–35)
ALT: 306 U/L — ABNORMAL HIGH (ref 0–35)
ALT: 501 U/L — ABNORMAL HIGH (ref 0–35)
ALT: 615 U/L — ABNORMAL HIGH (ref 0–35)
AST: 1076 U/L — ABNORMAL HIGH (ref 0–37)
AST: 442 U/L — ABNORMAL HIGH (ref 0–37)
AST: 76 U/L — ABNORMAL HIGH (ref 0–37)
AST: 84 U/L — ABNORMAL HIGH (ref 0–37)
Albumin: 2.8 g/dL — ABNORMAL LOW (ref 3.5–5.2)
Albumin: 2.9 g/dL — ABNORMAL LOW (ref 3.5–5.2)
Albumin: 3.4 g/dL — ABNORMAL LOW (ref 3.5–5.2)
Albumin: 4 g/dL (ref 3.5–5.2)
Alkaline Phosphatase: 103 U/L (ref 39–117)
Alkaline Phosphatase: 103 U/L (ref 39–117)
Alkaline Phosphatase: 107 U/L (ref 39–117)
Alkaline Phosphatase: 94 U/L (ref 39–117)
BUN: 10 mg/dL (ref 6–23)
BUN: 11 mg/dL (ref 6–23)
BUN: 15 mg/dL (ref 6–23)
BUN: 9 mg/dL (ref 6–23)
CO2: 26 mEq/L (ref 19–32)
CO2: 26 mEq/L (ref 19–32)
CO2: 28 mEq/L (ref 19–32)
CO2: 29 mEq/L (ref 19–32)
Calcium: 7.8 mg/dL — ABNORMAL LOW (ref 8.4–10.5)
Calcium: 7.8 mg/dL — ABNORMAL LOW (ref 8.4–10.5)
Calcium: 8 mg/dL — ABNORMAL LOW (ref 8.4–10.5)
Calcium: 9 mg/dL (ref 8.4–10.5)
Chloride: 103 mEq/L (ref 96–112)
Chloride: 105 mEq/L (ref 96–112)
Chloride: 106 mEq/L (ref 96–112)
Chloride: 107 mEq/L (ref 96–112)
Creatinine, Ser: 0.75 mg/dL (ref 0.4–1.2)
Creatinine, Ser: 0.86 mg/dL (ref 0.4–1.2)
Creatinine, Ser: 0.86 mg/dL (ref 0.4–1.2)
Creatinine, Ser: 0.89 mg/dL (ref 0.4–1.2)
GFR calc Af Amer: >60 mL/min (ref >60)
GFR calc Af Amer: >60 mL/min (ref >60)
GFR calc Af Amer: >60 mL/min (ref >60)
GFR calc Af Amer: >60 mL/min (ref >60)
GFR calc non Af Amer: >60 mL/min (ref >60)
GFR calc non Af Amer: >60 mL/min (ref >60)
GFR calc non Af Amer: >60 mL/min (ref >60)
GFR calc non Af Amer: >60 mL/min (ref >60)
Glucose, Bld: 292 mg/dL — ABNORMAL HIGH (ref 70–99)
Glucose, Bld: 292 mg/dL — ABNORMAL HIGH (ref 70–99)
Glucose, Bld: 293 mg/dL — ABNORMAL HIGH (ref 70–99)
Glucose, Bld: 301 mg/dL — ABNORMAL HIGH (ref 70–99)
Potassium: 3.8 mEq/L (ref 3.5–5.1)
Potassium: 4.5 mEq/L (ref 3.5–5.1)
Potassium: 4.6 mEq/L (ref 3.5–5.1)
Potassium: 4.6 mEq/L (ref 3.5–5.1)
Sodium: 136 mEq/L (ref 135–145)
Sodium: 137 mEq/L (ref 135–145)
Sodium: 139 mEq/L (ref 135–145)
Sodium: 141 mEq/L (ref 135–145)
Total Bilirubin: 1.4 mg/dL — ABNORMAL HIGH (ref 0.3–1.2)
Total Bilirubin: 1.5 mg/dL — ABNORMAL HIGH (ref 0.3–1.2)
Total Bilirubin: 1.8 mg/dL — ABNORMAL HIGH (ref 0.3–1.2)
Total Bilirubin: 3.2 mg/dL — ABNORMAL HIGH (ref 0.3–1.2)
Total Protein: 5.8 g/dL — ABNORMAL LOW (ref 6.0–8.3)
Total Protein: 5.9 g/dL — ABNORMAL LOW (ref 6.0–8.3)
Total Protein: 6.4 g/dL (ref 6.0–8.3)
Total Protein: 7.2 g/dL (ref 6.0–8.3)

## 2010-05-23 LAB — AMYLASE
Amylase: 297 U/L — ABNORMAL HIGH (ref 0–105)
Amylase: 43 U/L (ref 0–105)
Amylase: 49 U/L (ref 0–105)

## 2010-05-23 LAB — DIFFERENTIAL
Basophils Absolute: 0.1 10*3/uL (ref 0.0–0.1)
Basophils Relative: 0 % (ref 0–1)
Eosinophils Absolute: 0 10*3/uL (ref 0.0–0.7)
Eosinophils Relative: 0 % (ref 0–5)
Lymphocytes Relative: 5 % — ABNORMAL LOW (ref 12–46)
Lymphs Abs: 0.9 10*3/uL (ref 0.7–4.0)
Monocytes Absolute: 0.7 10*3/uL (ref 0.1–1.0)
Monocytes Relative: 4 % (ref 3–12)
Neutro Abs: 16.4 10*3/uL — ABNORMAL HIGH (ref 1.7–7.7)
Neutrophils Relative %: 91 % — ABNORMAL HIGH (ref 43–77)

## 2010-05-23 LAB — URINALYSIS, ROUTINE W REFLEX MICROSCOPIC
Bilirubin Urine: NEGATIVE
Glucose, UA: 500 mg/dL — AB
Hgb urine dipstick: NEGATIVE
Ketones, ur: NEGATIVE mg/dL
Nitrite: NEGATIVE
Protein, ur: NEGATIVE mg/dL
Specific Gravity, Urine: 1.017 (ref 1.005–1.030)
Urobilinogen, UA: 1 mg/dL (ref 0.0–1.0)
pH: 7.5 (ref 5.0–8.0)

## 2010-05-23 LAB — LIPASE, BLOOD
Lipase: 1651 U/L — ABNORMAL HIGH (ref 11–59)
Lipase: 57 U/L (ref 11–59)
Lipase: 65 U/L — ABNORMAL HIGH (ref 11–59)
Lipase: >2000 U/L — ABNORMAL HIGH (ref 11–59)

## 2010-05-23 LAB — PROTIME-INR
INR: 1.19 (ref 0.00–1.49)
Prothrombin Time: 15 seconds (ref 11.6–15.2)

## 2010-05-23 LAB — CANCER ANTIGEN 19-9: CA 19-9: 46.2 U/mL — ABNORMAL HIGH (ref <35.0)

## 2010-05-23 LAB — APTT: aPTT: 29 seconds (ref 24–37)

## 2010-05-23 LAB — POCT CARDIAC MARKERS
CKMB, poc: <1 ng/mL — ABNORMAL LOW (ref 1.0–8.0)
Myoglobin, poc: 42.9 ng/mL (ref 12–200)
Troponin i, poc: <0.05 ng/mL (ref 0.00–0.09)

## 2010-05-27 ENCOUNTER — Telehealth: Payer: Self-pay | Admitting: Internal Medicine

## 2010-05-27 MED ORDER — PEG-KCL-NACL-NASULF-NA ASC-C 100 G PO SOLR: 1.0000 | Freq: Once | ORAL | Status: AC

## 2010-05-27 NOTE — Telephone Encounter (Signed)
Resent mov[facility]rep to [facility] per pt request. Notified patient. Sherren Kerns

## 2010-05-30 ENCOUNTER — Encounter: Payer: Self-pay | Admitting: Internal Medicine

## 2010-05-31 ENCOUNTER — Encounter: Payer: Self-pay | Admitting: Internal Medicine

## 2010-05-31 ENCOUNTER — Ambulatory Visit (AMBULATORY_SURGERY_CENTER): Payer: Medicare Other | Admitting: Internal Medicine

## 2010-05-31 VITALS — BP 160/69 | HR 84 | Temp 97.2°F | Resp 14 | Ht 66.0 in | Wt 190.0 lb

## 2010-05-31 DIAGNOSIS — Z860100 Personal history of colon polyps, unspecified: Secondary | ICD-10-CM

## 2010-05-31 DIAGNOSIS — Z1211 Encounter for screening for malignant neoplasm of colon: Secondary | ICD-10-CM

## 2010-05-31 LAB — GLUCOSE, CAPILLARY
Glucose-Capillary: 171 mg/dL — ABNORMAL HIGH (ref 70–99)
Glucose-Capillary: 159 mg/dL — ABNORMAL HIGH (ref 70–99)

## 2010-05-31 NOTE — Patient Instructions (Signed)
Discharge instructions given with verbal understanding. Okay to resume previous medications.

## 2010-06-01 ENCOUNTER — Telehealth: Payer: Self-pay | Admitting: *Deleted

## 2010-06-01 NOTE — Telephone Encounter (Signed)

## 2010-12-15 ENCOUNTER — Other Ambulatory Visit (HOSPITAL_COMMUNITY): Payer: Self-pay | Admitting: Family Medicine

## 2010-12-15 DIAGNOSIS — Z1231 Encounter for screening mammogram for malignant neoplasm of breast: Secondary | ICD-10-CM

## 2011-01-23 ENCOUNTER — Ambulatory Visit (HOSPITAL_COMMUNITY)
Admission: RE | Admit: 2011-01-23 | Discharge: 2011-01-23 | Disposition: A | Discharge status: Home / Self Care | Payer: Medicare Other | Source: Ambulatory Visit | Attending: Family Medicine | Admitting: Family Medicine

## 2011-01-23 DIAGNOSIS — Z1231 Encounter for screening mammogram for malignant neoplasm of breast: Secondary | ICD-10-CM

## 2011-12-28 ENCOUNTER — Other Ambulatory Visit (HOSPITAL_COMMUNITY): Payer: Self-pay | Admitting: Family Medicine

## 2011-12-28 DIAGNOSIS — Z1231 Encounter for screening mammogram for malignant neoplasm of breast: Secondary | ICD-10-CM

## 2012-01-29 ENCOUNTER — Ambulatory Visit (HOSPITAL_COMMUNITY)
Admission: RE | Admit: 2012-01-29 | Discharge: 2012-01-29 | Disposition: A | Discharge status: Home / Self Care | Payer: Medicare Other | Source: Ambulatory Visit | Attending: Family Medicine | Admitting: Family Medicine

## 2012-01-29 DIAGNOSIS — Z1231 Encounter for screening mammogram for malignant neoplasm of breast: Secondary | ICD-10-CM | POA: Insufficient documentation

## 2012-02-01 ENCOUNTER — Other Ambulatory Visit: Payer: Self-pay | Admitting: Family Medicine

## 2012-02-01 DIAGNOSIS — R928 Other abnormal and inconclusive findings on diagnostic imaging of breast: Secondary | ICD-10-CM

## 2012-02-12 ENCOUNTER — Ambulatory Visit
Admission: RE | Admit: 2012-02-12 | Discharge: 2012-02-12 | Disposition: A | Discharge status: Home / Self Care | Payer: Medicare Other | Source: Ambulatory Visit | Attending: Family Medicine | Admitting: Family Medicine

## 2012-02-12 DIAGNOSIS — R928 Other abnormal and inconclusive findings on diagnostic imaging of breast: Secondary | ICD-10-CM

## 2013-01-08 ENCOUNTER — Other Ambulatory Visit (HOSPITAL_COMMUNITY): Payer: Self-pay | Admitting: Family Medicine

## 2013-01-08 DIAGNOSIS — Z1231 Encounter for screening mammogram for malignant neoplasm of breast: Secondary | ICD-10-CM

## 2013-02-03 ENCOUNTER — Ambulatory Visit (HOSPITAL_COMMUNITY)
Admission: RE | Admit: 2013-02-03 | Discharge: 2013-02-03 | Disposition: A | Discharge status: Home / Self Care | Payer: Medicare Other | Source: Ambulatory Visit | Attending: Family Medicine | Admitting: Family Medicine

## 2013-02-03 DIAGNOSIS — Z1231 Encounter for screening mammogram for malignant neoplasm of breast: Secondary | ICD-10-CM | POA: Insufficient documentation

## 2013-03-05 ENCOUNTER — Emergency Department (HOSPITAL_COMMUNITY): Payer: Medicare Other

## 2013-03-05 ENCOUNTER — Encounter (HOSPITAL_COMMUNITY): Payer: Self-pay | Admitting: Emergency Medicine

## 2013-03-05 ENCOUNTER — Emergency Department (HOSPITAL_COMMUNITY)
Admission: EM | Admit: 2013-03-05 | Discharge: 2013-03-05 | Disposition: A | Discharge status: Home / Self Care | Payer: Medicare Other | Attending: Emergency Medicine | Admitting: Emergency Medicine

## 2013-03-05 DIAGNOSIS — Z794 Long term (current) use of insulin: Secondary | ICD-10-CM | POA: Insufficient documentation

## 2013-03-05 DIAGNOSIS — R5381 Other malaise: Secondary | ICD-10-CM | POA: Insufficient documentation

## 2013-03-05 DIAGNOSIS — R197 Diarrhea, unspecified: Secondary | ICD-10-CM | POA: Insufficient documentation

## 2013-03-05 DIAGNOSIS — R509 Fever, unspecified: Secondary | ICD-10-CM | POA: Insufficient documentation

## 2013-03-05 DIAGNOSIS — J3489 Other specified disorders of nose and nasal sinuses: Secondary | ICD-10-CM | POA: Insufficient documentation

## 2013-03-05 DIAGNOSIS — Z860100 Personal history of colon polyps, unspecified: Secondary | ICD-10-CM | POA: Insufficient documentation

## 2013-03-05 DIAGNOSIS — Z8719 Personal history of other diseases of the digestive system: Secondary | ICD-10-CM | POA: Insufficient documentation

## 2013-03-05 DIAGNOSIS — Z7982 Long term (current) use of aspirin: Secondary | ICD-10-CM | POA: Insufficient documentation

## 2013-03-05 DIAGNOSIS — R5383 Other fatigue: Secondary | ICD-10-CM

## 2013-03-05 DIAGNOSIS — I10 Essential (primary) hypertension: Secondary | ICD-10-CM | POA: Insufficient documentation

## 2013-03-05 DIAGNOSIS — Z792 Long term (current) use of antibiotics: Secondary | ICD-10-CM | POA: Insufficient documentation

## 2013-03-05 DIAGNOSIS — E119 Type 2 diabetes mellitus without complications: Secondary | ICD-10-CM | POA: Insufficient documentation

## 2013-03-05 DIAGNOSIS — R748 Abnormal levels of other serum enzymes: Secondary | ICD-10-CM

## 2013-03-05 DIAGNOSIS — R05 Cough: Secondary | ICD-10-CM | POA: Insufficient documentation

## 2013-03-05 DIAGNOSIS — Z79899 Other long term (current) drug therapy: Secondary | ICD-10-CM | POA: Insufficient documentation

## 2013-03-05 DIAGNOSIS — R059 Cough, unspecified: Secondary | ICD-10-CM | POA: Insufficient documentation

## 2013-03-05 LAB — CBC WITH DIFFERENTIAL/PLATELET
Basophils Absolute: 0 10*3/uL (ref 0.0–0.1)
Basophils Relative: 0 % (ref 0–1)
Eosinophils Absolute: 0 10*3/uL (ref 0.0–0.7)
Eosinophils Relative: 1 % (ref 0–5)
HCT: 41.2 % (ref 36.0–46.0)
Hemoglobin: 13.9 g/dL (ref 12.0–15.0)
Lymphocytes Relative: 25 % (ref 12–46)
Lymphs Abs: 1.6 10*3/uL (ref 0.7–4.0)
MCH: 29.4 pg (ref 26.0–34.0)
MCHC: 33.7 g/dL (ref 30.0–36.0)
MCV: 87.3 fL (ref 78.0–100.0)
Monocytes Absolute: 0.9 10*3/uL (ref 0.1–1.0)
Monocytes Relative: 15 % — ABNORMAL HIGH (ref 3–12)
Neutro Abs: 3.6 10*3/uL (ref 1.7–7.7)
Neutrophils Relative %: 59 % (ref 43–77)
Platelets: 190 10*3/uL (ref 150–400)
RBC: 4.72 MIL/uL (ref 3.87–5.11)
RDW: 13.8 % (ref 11.5–15.5)
WBC: 6.2 10*3/uL (ref 4.0–10.5)

## 2013-03-05 LAB — BASIC METABOLIC PANEL
BUN: 30 mg/dL — ABNORMAL HIGH (ref 6–23)
CO2: 26 mEq/L (ref 19–32)
Calcium: 9 mg/dL (ref 8.4–10.5)
Chloride: 91 mEq/L — ABNORMAL LOW (ref 96–112)
Creatinine, Ser: 1.5 mg/dL — ABNORMAL HIGH (ref 0.50–1.10)
GFR calc Af Amer: 37 mL/min — ABNORMAL LOW (ref >90)
GFR calc non Af Amer: 32 mL/min — ABNORMAL LOW (ref >90)
Glucose, Bld: 150 mg/dL — ABNORMAL HIGH (ref 70–99)
Potassium: 4.1 mEq/L (ref 3.7–5.3)
Sodium: 130 mEq/L — ABNORMAL LOW (ref 137–147)

## 2013-03-05 MED ORDER — SODIUM CHLORIDE 0.9 % IV BOLUS (SEPSIS)
1000.0000 mL | Freq: Once | INTRAVENOUS | Status: AC
  Administered 2013-03-05: 1000 mL via INTRAVENOUS

## 2013-03-05 MED ORDER — ACETAMINOPHEN 500 MG PO TABS
1000.0000 mg | ORAL_TABLET | Freq: Once | ORAL | Status: AC
  Administered 2013-03-05: 1000 mg via ORAL
  Filled 2013-03-05: qty 2

## 2013-03-05 NOTE — ED Provider Notes (Signed)
CSN: [id number]     Arrival date & time 03/05/13  1607 History   First MD Initiated Contact with Patient 03/05/13 1805     Chief Complaint  Patient presents with  . Diarrhea  . Cough  . Fever    HPI  Patient presents with concerns cough, congestion, pain in all 4 distal extremities.  Patient has had cough, congestion for several weeks, with some relief with prescribed antitussive. She was the past days she has developed burning/soreness in all 4 extremities.  The pain is improved with Tylenol, though recurs after several hours. History the patient was evaluated by her primary care physician and started on azithromycin, albuterol.  This she has some improvement in her condition, but continues to feel poor.  No current chest pain, belly pain, vomiting, though the patient does complain of anorexia and diarrhea.   Past Medical History  Diagnosis Date  . Diabetes mellitus 1992  . Hypertension 1996  . Colon polyp   . Pancreatitis    Past Surgical History  Procedure Laterality Date  . Partial hysterectomy  [age]years old  . Cholecystectomy    . Colonoscopy    . Polypectomy    . Upper gastrointestinal endoscopy    . Ercp    . Wrist fracture surgery  left arm,[age]years old   No family history on file. History  Substance Use Topics  . Smoking status: Never Smoker   . Smokeless tobacco: Never Used  . Alcohol Use: Not on file   OB History   Grav Para Term Preterm Abortions TAB SAB Ect Mult Living                 Review of Systems  Constitutional:       Per HPI, otherwise negative  HENT:       Per HPI, otherwise negative  Respiratory:       Per HPI, otherwise negative  Cardiovascular:       Per HPI, otherwise negative  Gastrointestinal: Negative for vomiting.  Endocrine:       Negative aside from HPI  Genitourinary:       Neg aside from HPI   Musculoskeletal:       Per HPI, otherwise negative  Skin: Negative.   Neurological: Negative for syncope.    Allergies  Morphine  and related  Home Medications   Current Outpatient Rx  Name  Route  Sig  Dispense  Refill  . albuterol (PROVENTIL HFA;VENTOLIN HFA) 108 (90 BASE) MCG/ACT inhaler   Inhalation   Inhale 2 puffs into the lungs every 6 (six) hours as needed for wheezing or shortness of breath.         . ALPRAZolam (XANAX XR) 0.5 MG 24 hr tablet   Oral   Take 0.5 mg by mouth at bedtime as needed for sleep.         Marland Kitchen. aspirin 81 MG tablet   Oral   Take 81 mg by mouth daily.           Marland Kitchen. azithromycin (ZITHROMAX) 250 MG tablet   Oral   Take 250 mg by mouth daily.         . calcium carbonate (OS-CAL) 600 MG TABS tablet   Oral   Take 600 mg by mouth 2 (two) times daily with a meal.         . Canagliflozin (INVOKANA) 300 MG TABS   Oral   Take 300 mg by mouth daily.         .Marland Kitchen  chlorthalidone (HYGROTON) 50 MG tablet   Oral   Take 100 mg by mouth daily.         . cloNIDine (CATAPRES - DOSED IN MG/24 HR) 0.3 mg/24hr patch   Transdermal   Place 0.3 mg onto the skin once a week. On Saturday.         Marland Kitchen dextromethorphan-guaiFENesin (MUCINEX DM) 30-600 MG per 12 hr tablet   Oral   Take 1 tablet by mouth 2 (two) times daily.         . insulin aspart (NOVOLOG) 100 UNIT/ML FlexPen   Subcutaneous   Inject 10 Units into the skin 2 (two) times daily. With lunch and dinner.         . insulin glargine (LANTUS) 100 UNIT/ML injection   Subcutaneous   Inject 46 Units into the skin at bedtime.          Marland Kitchen losartan (COZAAR) 100 MG tablet   Oral   Take 100 mg by mouth daily.         . metFORMIN (GLUCOPHAGE) 1000 MG tablet   Oral   Take 1,000 mg by mouth 2 (two) times daily with a meal. Take 1 tablet bid         . metoprolol (TOPROL-XL) 200 MG 24 hr tablet   Oral   Take 200 mg by mouth daily.         . pravastatin (PRAVACHOL) 20 MG tablet   Oral   Take 20 mg by mouth daily. On Monday, Wednesday, and Friday.         Marland Kitchen spironolactone (ALDACTONE) 25 MG tablet   Oral   Take 25  mg by mouth daily as needed (fluid.).          BP 166/85  Pulse 56  Temp(Src) 98.4 F (36.9 C) (Oral)  Resp 18  SpO2 95% Physical Exam  Nursing note and vitals reviewed. Constitutional: She is oriented to person, place, and time. She appears well-developed and well-nourished. No distress.  HENT:  Head: Normocephalic and atraumatic.  Eyes: Conjunctivae and EOM are normal.  Cardiovascular: Normal rate and regular rhythm.   Pulmonary/Chest: Effort normal and breath sounds normal. No stridor. No respiratory distress.  Abdominal: She exhibits no distension.  Musculoskeletal: She exhibits no edema.  Neurological: She is alert and oriented to person, place, and time. No cranial nerve deficit.  Skin: Skin is warm and dry.  Psychiatric: She has a normal mood and affect.    ED Course  Procedures (including critical care time) Labs Review Labs Reviewed  BASIC METABOLIC PANEL  CBC WITH DIFFERENTIAL   Imaging Review No results found.  EKG Interpretation   None      11:23 PM Patient appears well, states that she feels better, has no active complaints.  We discussed return precautions, follow up instructions.  MDM   1. Increased creatine kinase level   2. Cough    Patient presents with concerns of fatigue, cough, diarrhea.  Patient was in no distress on my exam, hemodynamically stable, neurovascularly intact.  Patient's evaluation is most notable for demonstration of elevated creatinine.  Patient had fluid rehydration and felt substantially better.  With no active ongoing complaints, she was appropriate for outpatient management    Gerhard Munch, MD 03/05/13 2324

## 2013-03-05 NOTE — ED Notes (Signed)
Pt states that she has been having diarrhea and cough since Sunday.  Went to the doctor on Monday.  Was given an inhaler, mucinex, z pack.  Now does not feel any better.  Also states that she knows she has a fever because "98 for me is a fever.  I am a [age] girl".

## 2013-03-05 NOTE — Discharge Instructions (Signed)
As discussed, it is important that you follow up as soon as possible with your physician for continued management of your condition. ° °If you develop any new, or concerning changes in your condition, please return to the emergency department immediately. ° °Cough, Adult ° A cough is a reflex. It helps you clear your throat and airways. A cough can help heal your body. A cough can last 2 or 3 weeks (acute) or may last more than 8 weeks (chronic). Some common causes of a cough can include an infection, allergy, or a cold. °HOME CARE °· Only take medicine as told by your doctor. °· If given, take your medicines (antibiotics) as told. Finish them even if you start to feel better. °· Use a cold steam vaporizer or humidier in your home. This can help loosen thick spit (secretions). °· Sleep so you are almost sitting up (semi-upright). Use pillows to do this. This helps reduce coughing. °· Rest as needed. °· Stop smoking if you smoke. °GET HELP RIGHT AWAY IF: °· You have yellowish-white fluid (pus) in your thick spit. °· Your cough gets worse. °· Your medicine does not reduce coughing, and you are losing sleep. °· You cough up blood. °· You have trouble breathing. °· Your pain gets worse and medicine does not help. °· You have a fever. °MAKE SURE YOU:  °· Understand these instructions. °· Will watch your condition. °· Will get help right away if you are not doing well or get worse. °Document Released: 10/27/2010 Document Revised: 05/08/2011 Document Reviewed: 10/27/2010 °[employer]® Patient Information ©2014 [employer]. ° °

## 2013-07-28 DEATH — deceased

## 2013-12-29 ENCOUNTER — Other Ambulatory Visit (HOSPITAL_COMMUNITY): Payer: Self-pay | Admitting: Family Medicine

## 2013-12-29 DIAGNOSIS — Z1231 Encounter for screening mammogram for malignant neoplasm of breast: Secondary | ICD-10-CM

## 2014-02-11 ENCOUNTER — Ambulatory Visit (HOSPITAL_COMMUNITY)
Admission: RE | Admit: 2014-02-11 | Discharge: 2014-02-11 | Disposition: A | Discharge status: Home / Self Care | Payer: Medicare Other | Source: Ambulatory Visit | Attending: Family Medicine | Admitting: Family Medicine

## 2014-02-11 DIAGNOSIS — Z1231 Encounter for screening mammogram for malignant neoplasm of breast: Secondary | ICD-10-CM | POA: Insufficient documentation

## 2014-07-31 ENCOUNTER — Encounter: Payer: Self-pay | Admitting: Internal Medicine

## 2015-01-06 ENCOUNTER — Other Ambulatory Visit: Payer: Self-pay

## 2015-01-06 DIAGNOSIS — Z1231 Encounter for screening mammogram for malignant neoplasm of breast: Secondary | ICD-10-CM

## 2015-02-17 ENCOUNTER — Ambulatory Visit: Payer: Medicare Other

## 2015-03-16 ENCOUNTER — Ambulatory Visit: Payer: Medicare Other

## 2015-04-01 ENCOUNTER — Ambulatory Visit
Admission: RE | Admit: 2015-04-01 | Discharge: 2015-04-01 | Disposition: A | Discharge status: Home / Self Care | Payer: Medicare Other | Source: Ambulatory Visit

## 2015-04-01 DIAGNOSIS — Z1231 Encounter for screening mammogram for malignant neoplasm of breast: Secondary | ICD-10-CM

## 2015-09-06 ENCOUNTER — Other Ambulatory Visit: Payer: Self-pay | Admitting: Nephrology

## 2015-09-06 DIAGNOSIS — N183 Chronic kidney disease, stage 3 (moderate): Secondary | ICD-10-CM

## 2015-09-23 ENCOUNTER — Ambulatory Visit
Admission: RE | Admit: 2015-09-23 | Discharge: 2015-09-23 | Disposition: A | Discharge status: Home / Self Care | Payer: Medicare Other | Source: Ambulatory Visit | Attending: Nephrology | Admitting: Nephrology

## 2015-09-23 DIAGNOSIS — N183 Chronic kidney disease, stage 3 (moderate): Secondary | ICD-10-CM

## 2016-02-25 ENCOUNTER — Other Ambulatory Visit: Payer: Self-pay | Admitting: Family Medicine

## 2016-02-25 DIAGNOSIS — Z1231 Encounter for screening mammogram for malignant neoplasm of breast: Secondary | ICD-10-CM

## 2016-04-05 ENCOUNTER — Ambulatory Visit
Admission: RE | Admit: 2016-04-05 | Discharge: 2016-04-05 | Disposition: A | Discharge status: Home / Self Care | Payer: Medicare Other | Source: Ambulatory Visit | Attending: Family Medicine | Admitting: Family Medicine

## 2016-04-05 DIAGNOSIS — Z1231 Encounter for screening mammogram for malignant neoplasm of breast: Secondary | ICD-10-CM

## 2016-11-29 ENCOUNTER — Encounter (HOSPITAL_COMMUNITY): Payer: Self-pay | Admitting: *Deleted

## 2016-11-29 ENCOUNTER — Emergency Department (HOSPITAL_COMMUNITY): Payer: Medicare Other

## 2016-11-29 ENCOUNTER — Emergency Department (HOSPITAL_COMMUNITY)
Admission: EM | Admit: 2016-11-29 | Discharge: 2016-11-29 | Disposition: A | Discharge status: Home / Self Care | Payer: Medicare Other | Attending: Emergency Medicine | Admitting: Emergency Medicine

## 2016-11-29 DIAGNOSIS — H8110 Benign paroxysmal vertigo, unspecified ear: Secondary | ICD-10-CM | POA: Diagnosis not present

## 2016-11-29 DIAGNOSIS — E119 Type 2 diabetes mellitus without complications: Secondary | ICD-10-CM | POA: Diagnosis not present

## 2016-11-29 DIAGNOSIS — E785 Hyperlipidemia, unspecified: Secondary | ICD-10-CM | POA: Diagnosis not present

## 2016-11-29 DIAGNOSIS — Z79899 Other long term (current) drug therapy: Secondary | ICD-10-CM | POA: Diagnosis not present

## 2016-11-29 DIAGNOSIS — R42 Dizziness and giddiness: Secondary | ICD-10-CM | POA: Diagnosis present

## 2016-11-29 DIAGNOSIS — I10 Essential (primary) hypertension: Secondary | ICD-10-CM | POA: Diagnosis not present

## 2016-11-29 DIAGNOSIS — Z7982 Long term (current) use of aspirin: Secondary | ICD-10-CM | POA: Diagnosis not present

## 2016-11-29 LAB — URINALYSIS, ROUTINE W REFLEX MICROSCOPIC
Bilirubin Urine: NEGATIVE
Glucose, UA: >=500 mg/dL — AB
Hgb urine dipstick: NEGATIVE
Ketones, ur: NEGATIVE mg/dL
Leukocytes, UA: NEGATIVE
Nitrite: NEGATIVE
Protein, ur: 100 mg/dL — AB
Specific Gravity, Urine: 1.013 (ref 1.005–1.030)
pH: 7 (ref 5.0–8.0)

## 2016-11-29 LAB — BASIC METABOLIC PANEL
Anion gap: 11 (ref 5–15)
BUN: 25 mg/dL — ABNORMAL HIGH (ref 6–20)
CO2: 29 mmol/L (ref 22–32)
Calcium: 9.3 mg/dL (ref 8.9–10.3)
Chloride: 95 mmol/L — ABNORMAL LOW (ref 101–111)
Creatinine, Ser: 1.24 mg/dL — ABNORMAL HIGH (ref 0.44–1.00)
GFR calc Af Amer: 46 mL/min — ABNORMAL LOW (ref >60)
GFR calc non Af Amer: 39 mL/min — ABNORMAL LOW (ref >60)
Glucose, Bld: 361 mg/dL — ABNORMAL HIGH (ref 65–99)
Potassium: 3.3 mmol/L — ABNORMAL LOW (ref 3.5–5.1)
Sodium: 135 mmol/L (ref 135–145)

## 2016-11-29 LAB — CBC
HCT: 43.1 % (ref 36.0–46.0)
Hemoglobin: 14 g/dL (ref 12.0–15.0)
MCH: 29.2 pg (ref 26.0–34.0)
MCHC: 32.5 g/dL (ref 30.0–36.0)
MCV: 90 fL (ref 78.0–100.0)
Platelets: 227 10*3/uL (ref 150–400)
RBC: 4.79 MIL/uL (ref 3.87–5.11)
RDW: 13.4 % (ref 11.5–15.5)
WBC: 9.1 10*3/uL (ref 4.0–10.5)

## 2016-11-29 LAB — HEPATIC FUNCTION PANEL
ALT: 24 U/L (ref 14–54)
AST: 26 U/L (ref 15–41)
Albumin: 4 g/dL (ref 3.5–5.0)
Alkaline Phosphatase: 74 U/L (ref 38–126)
Bilirubin, Direct: <0.1 mg/dL — ABNORMAL LOW (ref 0.1–0.5)
Total Bilirubin: 0.6 mg/dL (ref 0.3–1.2)
Total Protein: 7.3 g/dL (ref 6.5–8.1)

## 2016-11-29 LAB — CBG MONITORING, ED
Glucose-Capillary: 334 mg/dL — ABNORMAL HIGH (ref 65–99)
Glucose-Capillary: 355 mg/dL — ABNORMAL HIGH (ref 65–99)

## 2016-11-29 LAB — LIPASE, BLOOD: Lipase: 36 U/L (ref 11–51)

## 2016-11-29 MED ORDER — LORAZEPAM 2 MG/ML IJ SOLN
0.5000 mg | Freq: Once | INTRAMUSCULAR | Status: AC
  Administered 2016-11-29: 0.5 mg via INTRAVENOUS
  Filled 2016-11-29: qty 1

## 2016-11-29 MED ORDER — CLONIDINE HCL 0.2 MG PO TABS
0.2000 mg | ORAL_TABLET | Freq: Once | ORAL | Status: AC
  Administered 2016-11-29: 0.2 mg via ORAL
  Filled 2016-11-29: qty 1

## 2016-11-29 MED ORDER — MECLIZINE HCL 12.5 MG PO TABS: 12.5000 mg | ORAL_TABLET | Freq: Three times a day (TID) | ORAL | 0 refills | Status: DC | PRN

## 2016-11-29 MED ORDER — SODIUM CHLORIDE 0.9 % IV BOLUS (SEPSIS)
500.0000 mL | Freq: Once | INTRAVENOUS | Status: AC
  Administered 2016-11-29: 500 mL via INTRAVENOUS

## 2016-11-29 NOTE — ED Notes (Signed)
Family at bedside. 

## 2016-11-29 NOTE — ED Notes (Signed)
ED Provider at bedside. 

## 2016-11-29 NOTE — ED Provider Notes (Signed)
[facility]-EMERGENCY DEPT Provider Note   CSN: [id number] Arrival date & time: 11/29/16  7253   History   Chief Complaint Chief Complaint  Patient presents with  . Dizziness    HPI   Blood pressure (!) 141/97, pulse (!) 58, temperature (!) 97.4 F (36.3 C), temperature source Oral, resp. rate 10, SpO2 95 %.  [name] [name] is a 83 y.o. female with past medical history significant for insulin dependent diabetes, hypertension complaining of acute onset of nausea, vomiting, vertigo when she woke up this morning at 4 AM. She's had 2 prior episodes similarly in the past. They came on acutely and she describes them as worse with head movement. She states that she feels like the room is spinning in the same direction and she cannot see the letters on the clock. She has difficulty walking when she's having the episodes. She's had 2 episodes over the last 3 months. They are somewhat alleviated with antinausea medications. Daughter came over to her house immediately after the onset of symptoms, there was no fall this morning. She states that there was no slurred speech or unilateral weakness. Patient takes a daily aspirin. She's never been a smoker. She states that this is similar to prior episodes. She feels that she has a right-sided sinus and ear pressure over the last week. Denies fever, chills, tinnitus.  Past Medical History:  Diagnosis Date  . Colon polyp   . Diabetes mellitus 1992  . Hypertension 1996  . Pancreatitis     Patient Active Problem List   Diagnosis Date Noted  . DIABETES MELLITUS-TYPE II 06/01/2009  . HYPERLIPIDEMIA 06/01/2009  . HYPERTENSION 06/01/2009  . CHOLELITHIASIS 06/01/2009  . PANCREATITIS 06/01/2009  . PERSONAL HX COLONIC POLYPS 06/01/2009    Past Surgical History:  Procedure Laterality Date  . CHOLECYSTECTOMY    . COLONOSCOPY    . ERCP    . PARTIAL HYSTERECTOMY  83years old  . POLYPECTOMY    . UPPER GASTROINTESTINAL ENDOSCOPY    . WRIST FRACTURE SURGERY   left arm,83years old    OB History    No data available       Home Medications    Prior to Admission medications   Medication Sig Start Date End Date Taking? Authorizing Provider  acetaminophen (TYLENOL) 500 MG tablet Take 500 mg by mouth every 6 (six) hours as needed for mild pain.   Yes [provider]  albuterol (PROVENTIL HFA;VENTOLIN HFA) 108 (90 BASE) MCG/ACT inhaler Inhale 2 puffs into the lungs every 6 (six) hours as needed for wheezing or shortness of breath.   Yes [provider]  aspirin 81 MG tablet Take 81 mg by mouth daily.     Yes [provider]  CALCIUM CARBONATE-VITAMIN D PO Take 1 tablet by mouth 2 (two) times daily.   Yes [provider]  chlorthalidone (HYGROTON) 50 MG tablet Take 100 mg by mouth daily.   Yes [provider]  cloNIDine (CATAPRES) 0.2 MG tablet Take 0.2-0.3 mg by mouth See admin instructions. 0.2mg  in am, 0.3mg  in pm   Yes [provider]  furosemide (LASIX) 20 MG tablet Take 20 mg by mouth daily.   Yes [provider]  insulin NPH-regular Human (NOVOLIN 70/30) (70-30) 100 UNIT/ML injection Inject 40 Units into the skin 3 (three) times daily.   Yes [provider]  losartan (COZAAR) 100 MG tablet Take 100 mg by mouth daily.   Yes [provider]  metoprolol (TOPROL-XL) 200 MG 24  hr tablet Take 200 mg by mouth daily.   Yes [provider]  neomycin-polymyxin b-dexamethasone (MAXITROL) 3.5-10000-0.1 SUSP Place 1 drop into the left eye 4 (four) times daily. 11/24/16  Yes [provider]  pravastatin (PRAVACHOL) 20 MG tablet Take 20 mg by mouth every Monday, Wednesday, and Friday. On Monday, Wednesday, and Friday.   Yes [provider]  meclizine (ANTIVERT) 12.5 MG tablet Take 1 tablet (12.5 mg total) by mouth 3 (three) times daily as needed for dizziness. 11/29/16   Celese Banner, Mardella Layman    Family History No family history on file.  Social  History Social History  Substance Use Topics  . Smoking status: Never Smoker  . Smokeless tobacco: Never Used  . Alcohol use Not on file     Allergies   Ace inhibitors; Amlodipine; and Morphine and related   Review of Systems Review of Systems  A complete review of systems was obtained and all systems are negative except as noted in the HPI and PMH.   Physical Exam Updated Vital Signs BP (!) 193/65   Pulse (!) 58   Temp (!) 97.4 F (36.3 C) (Oral)   Resp 16   SpO2 97%   Physical Exam  Constitutional: She is oriented to person, place, and time. She appears well-developed and well-nourished. No distress.  HENT:  Head: Normocephalic and atraumatic.  Mouth/Throat: Oropharynx is clear and moist.  Eyes: Pupils are equal, round, and reactive to light. Conjunctivae and EOM are normal.  Neck: Normal range of motion.  Cardiovascular: Normal rate, regular rhythm and intact distal pulses.   Pulmonary/Chest: Effort normal and breath sounds normal.  Abdominal: Soft. There is no tenderness.  Musculoskeletal: Normal range of motion.  Neurological: She is alert and oriented to person, place, and time.  Skin: Capillary refill takes less than 2 seconds. She is not diaphoretic.  Psychiatric: She has a normal mood and affect.  Nursing note and vitals reviewed.    ED Treatments / Results  Labs (all labs ordered are listed, but only abnormal results are displayed) Labs Reviewed  BASIC METABOLIC PANEL - Abnormal; Notable for the following:       Result Value   Potassium 3.3 (*)    Chloride 95 (*)    Glucose, Bld 361 (*)    BUN 25 (*)    Creatinine, Ser 1.24 (*)    GFR calc non Af Amer 39 (*)    GFR calc Af Amer 46 (*)    All other components within normal limits  URINALYSIS, ROUTINE W REFLEX MICROSCOPIC - Abnormal; Notable for the following:    Color, Urine STRAW (*)    Glucose, UA >=500 (*)    Protein, ur 100 (*)    Bacteria, UA RARE (*)    Squamous Epithelial / LPF 0-5 (*)      All other components within normal limits  HEPATIC FUNCTION PANEL - Abnormal; Notable for the following:    Bilirubin, Direct <0.1 (*)    All other components within normal limits  CBG MONITORING, ED - Abnormal; Notable for the following:    Glucose-Capillary 334 (*)    All other components within normal limits  CBC  LIPASE, BLOOD  CBG MONITORING, ED    EKG  EKG Interpretation  Date/Time:  Wednesday November 29 2016 07:34:22 EDT Ventricular Rate:  51 PR Interval:  152 QRS Duration: 92 QT Interval:  478 QTC Calculation: 440 R Axis:   54 Text Interpretation:  Sinus bradycardia T wave abnormality,  consider inferolateral ischemia Abnormal ECG since last tracing no significant change Confirmed by Rolan Bucco [phone]) on 11/29/2016 9:19:32 AM       Radiology Mr Brain Wo Contrast  Result Date: 11/29/2016 CLINICAL DATA:  Acute onset of vertigo with nausea and vomiting when waking up this morning."Epidemic vertigo" EXAM: MRI HEAD WITHOUT CONTRAST TECHNIQUE: Multiplanar, multiecho pulse sequences of the brain and surrounding structures were obtained without intravenous contrast. COMPARISON:  None. FINDINGS: Brain: No acute infarction, hemorrhage, hydrocephalus, extra-axial collection or mass effect. Moderate FLAIR hyperintensity in the cerebral white matter, usually chronic small vessel ischemia. Remote lacunar infarct in the right paramedian pons. Nodular dural base calcification along the anterior right frontal convexity measuring 7 mm. No abnormal or asymmetric finding in the CP angle cisterns or temporal bones. Vascular: Major flow voids are preserved, including at the vertebrobasilar circulation. Skull and upper cervical spine: Negative for marrow lesion Sinuses/Orbits: Mucous retention cysts in the floor of the left maxillary sinus. Bilateral cataract resection. No acute finding. Other: Dermal inclusion cyst in the upper scalp. IMPRESSION: 1. No acute finding, including infarct. 2.  Moderate chronic microvascular ischemic change. 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 Electronically Signed   By: Marnee Spring M.D.   On: 11/29/2016 11:30    Procedures Procedures (including critical care time)  Medications Ordered in ED Medications  LORazepam (ATIVAN) injection 0.5 mg (0.5 mg Intravenous Given 11/29/16 0859)  sodium chloride 0.9 % bolus 500 mL (0 mLs Intravenous Stopped 11/29/16 1106)  cloNIDine (CATAPRES) tablet 0.2 mg (0.2 mg Oral Given 11/29/16 1310)     Initial Impression / Assessment and Plan / ED Course  I have reviewed the triage vital signs and the nursing notes.  Pertinent labs & imaging results that were available during my care of the patient were reviewed by me and considered in my medical decision making (see chart for details).     Vitals:   11/29/16 1030 11/29/16 1130 11/29/16 1233 11/29/16 1245  BP: (!) 187/128  (!) 190/53 (!) 193/65  Pulse: (!) 56 63 60 (!) 58  Resp: Temp:      TempSrc:      SpO2: 98% 99% 97% 97%    Medications  LORazepam (ATIVAN) injection 0.5 mg (0.5 mg Intravenous Given 11/29/16 0859)  sodium chloride 0.9 % bolus 500 mL (0 mLs Intravenous Stopped 11/29/16 1106)  cloNIDine (CATAPRES) tablet 0.2 mg (0.2 mg Oral Given 11/29/16 1310)    [name] [name] is 83 y.o. female presenting with Acute onset of severe vertigo with nausea and vomiting, she's had 3 episodes in the last several months. Neurologic exam nonfocal, likely peripheral vertigo. Blood work, EKG reassuring. Given her age and risk factors will obtain MRI.  MRI with no acute findings. Dix-Hallpike is negative however she has some pressure in the right side of her ear so I will have her perform the Epley maneuver on the right side, referral given to ENT. Patient is tolerating oral fluids, she is still relatively unsteady on her feet however she feels comfortable going home and her daughter states that she will stay with her, I've invited him to return to  the ED at any time for any new or worsening symptoms.  Evaluation does not show pathology that would require ongoing emergent intervention or inpatient treatment. Pt is hemodynamically stable and mentating appropriately. Discussed findings and plan with patient/guardian, who agrees with care plan. All questions answered. Return precautions discussed and outpatient follow up given.  Final Clinical Impressions(s) / ED Diagnoses   Final diagnoses:  BPPV (benign paroxysmal positional vertigo), unspecified laterality    New Prescriptions New Prescriptions   MECLIZINE (ANTIVERT) 12.5 MG TABLET    Take 1 tablet (12.5 mg total) by mouth 3 (three) times daily as needed for dizziness.     Wynetta Emery, Cordelia Poche 11/29/16 1403    Rolan Bucco, MD 11/30/16 [phone]

## 2016-11-29 NOTE — ED Notes (Signed)
Pt transported to MRI 

## 2016-11-29 NOTE — ED Notes (Signed)
Pt given water and coffee

## 2016-11-29 NOTE — Discharge Instructions (Signed)
Please follow with your primary care doctor in the next 2 days for a check-up. They must obtain records for further management.  ° °Do not hesitate to return to the Emergency Department for any new, worsening or concerning symptoms.  ° °

## 2016-11-29 NOTE — ED Notes (Signed)
Pt was ambulated down hallway with stand by assistance, pt was able to ambulate a little unsteady and only able to safety ambulate approx 30 ft down hallway and back to room. PA came to bedside to speak with pt and family everyone agreeable that pt is safe for discharge with close supervision at home.

## 2016-11-29 NOTE — ED Triage Notes (Signed)
To ED for eval after waking up dizzy this and vomiting. Pt states she has experienced this once a month for the past 2 months. Seen at [facility] for same and talking with pcp about insulin doses. Pt ambulatory but appears unsteady. Speech is clear. Denies CP or SOB. Pt is diaphoretic. CBG in triage 334

## 2017-03-05 ENCOUNTER — Other Ambulatory Visit: Payer: Self-pay | Admitting: Family Medicine

## 2017-03-05 DIAGNOSIS — Z1231 Encounter for screening mammogram for malignant neoplasm of breast: Secondary | ICD-10-CM

## 2017-04-04 ENCOUNTER — Other Ambulatory Visit: Payer: Self-pay | Admitting: Family Medicine

## 2017-04-04 DIAGNOSIS — R5381 Other malaise: Secondary | ICD-10-CM

## 2017-04-09 ENCOUNTER — Ambulatory Visit
Admission: RE | Admit: 2017-04-09 | Discharge: 2017-04-09 | Disposition: A | Discharge status: Home / Self Care | Payer: Medicare Other | Source: Ambulatory Visit | Attending: Family Medicine | Admitting: Family Medicine

## 2017-04-09 DIAGNOSIS — Z1231 Encounter for screening mammogram for malignant neoplasm of breast: Secondary | ICD-10-CM

## 2017-05-08 ENCOUNTER — Other Ambulatory Visit: Payer: Self-pay | Admitting: Family Medicine

## 2017-05-25 ENCOUNTER — Other Ambulatory Visit: Payer: Self-pay | Admitting: Family Medicine

## 2017-05-25 DIAGNOSIS — E2839 Other primary ovarian failure: Secondary | ICD-10-CM

## 2017-06-17 ENCOUNTER — Emergency Department (HOSPITAL_BASED_OUTPATIENT_CLINIC_OR_DEPARTMENT_OTHER): Payer: Medicare Other

## 2017-06-17 ENCOUNTER — Other Ambulatory Visit: Payer: Self-pay

## 2017-06-17 ENCOUNTER — Emergency Department (HOSPITAL_BASED_OUTPATIENT_CLINIC_OR_DEPARTMENT_OTHER)
Admission: EM | Admit: 2017-06-17 | Discharge: 2017-06-17 | Disposition: A | Discharge status: Home / Self Care | Payer: Medicare Other | Attending: Emergency Medicine | Admitting: Emergency Medicine

## 2017-06-17 ENCOUNTER — Encounter (HOSPITAL_BASED_OUTPATIENT_CLINIC_OR_DEPARTMENT_OTHER): Payer: Self-pay | Admitting: Emergency Medicine

## 2017-06-17 DIAGNOSIS — S8991XA Unspecified injury of right lower leg, initial encounter: Secondary | ICD-10-CM | POA: Diagnosis present

## 2017-06-17 DIAGNOSIS — S76311A Strain of muscle, fascia and tendon of the posterior muscle group at thigh level, right thigh, initial encounter: Secondary | ICD-10-CM

## 2017-06-17 DIAGNOSIS — Z794 Long term (current) use of insulin: Secondary | ICD-10-CM | POA: Diagnosis not present

## 2017-06-17 DIAGNOSIS — E119 Type 2 diabetes mellitus without complications: Secondary | ICD-10-CM | POA: Insufficient documentation

## 2017-06-17 DIAGNOSIS — Y929 Unspecified place or not applicable: Secondary | ICD-10-CM | POA: Diagnosis not present

## 2017-06-17 DIAGNOSIS — Z9049 Acquired absence of other specified parts of digestive tract: Secondary | ICD-10-CM | POA: Insufficient documentation

## 2017-06-17 DIAGNOSIS — Y998 Other external cause status: Secondary | ICD-10-CM | POA: Insufficient documentation

## 2017-06-17 DIAGNOSIS — Z7982 Long term (current) use of aspirin: Secondary | ICD-10-CM | POA: Diagnosis not present

## 2017-06-17 DIAGNOSIS — Y93B9 Activity, other involving muscle strengthening exercises: Secondary | ICD-10-CM | POA: Diagnosis not present

## 2017-06-17 DIAGNOSIS — I10 Essential (primary) hypertension: Secondary | ICD-10-CM | POA: Insufficient documentation

## 2017-06-17 DIAGNOSIS — X58XXXA Exposure to other specified factors, initial encounter: Secondary | ICD-10-CM | POA: Insufficient documentation

## 2017-06-17 DIAGNOSIS — Z79899 Other long term (current) drug therapy: Secondary | ICD-10-CM | POA: Insufficient documentation

## 2017-06-17 MED ORDER — DICLOFENAC SODIUM 1 % TD GEL: 4.0000 g | Freq: Four times a day (QID) | TRANSDERMAL | 0 refills | Status: DC

## 2017-06-17 MED ORDER — OXYCODONE HCL 5 MG PO TABS: 2.5000 mg | ORAL_TABLET | Freq: Once | ORAL | Status: DC

## 2017-06-17 MED ORDER — ACETAMINOPHEN 500 MG PO TABS
1000.0000 mg | ORAL_TABLET | Freq: Once | ORAL | Status: AC
  Administered 2017-06-17: 1000 mg via ORAL
  Filled 2017-06-17: qty 2

## 2017-06-17 NOTE — ED Notes (Addendum)
Pt took  tylenol at 900am.

## 2017-06-17 NOTE — ED Provider Notes (Signed)
[facility] EMERGENCY DEPARTMENT Provider Note   CSN: [id number] Arrival date & time: 06/17/17  1421     History   Chief Complaint Chief Complaint  Patient presents with  . Leg Swelling  . Leg Pain    HPI [name] [name] is a 84 y.o. female.  84 yo F with a chief complaint of right leg pain.  This been going on for the past 3 or 4 days.  The patient has been doing chair aerobics and does not remember an injury but thinks that could have been the cause of her pain.  She denies trauma.  Denies chest pain or shortness of breath.  Has pain worse with ambulation.  Feels it to the back of the knee and then it radiates a little bit down the medial aspect of the leg.  Denies numbness or tingling.  Denies weakness.  Has chronic back pain is unchanged.  Denies abdominal pain dysuria increased frequency or hesitancy.  The history is provided by the patient.  Leg Pain   This is a new problem. The current episode started more than 2 days ago. The problem occurs constantly. The problem has been gradually worsening. The pain is present in the right lower leg. The quality of the pain is described as sharp and dull. The pain is at a severity of 8/10. The pain is moderate. She has tried nothing for the symptoms. The treatment provided no relief. There has been no history of extremity trauma.    Past Medical History:  Diagnosis Date  . Colon polyp   . Diabetes mellitus 1992  . Hypertension 1996  . Pancreatitis     Patient Active Problem List   Diagnosis Date Noted  . DIABETES MELLITUS-TYPE II 06/01/2009  . HYPERLIPIDEMIA 06/01/2009  . HYPERTENSION 06/01/2009  . CHOLELITHIASIS 06/01/2009  . PANCREATITIS 06/01/2009  . PERSONAL HX COLONIC POLYPS 06/01/2009    Past Surgical History:  Procedure Laterality Date  . CHOLECYSTECTOMY    . COLONOSCOPY    . ERCP    . PARTIAL HYSTERECTOMY  84years old  . POLYPECTOMY    . UPPER GASTROINTESTINAL ENDOSCOPY    . WRIST FRACTURE SURGERY   left arm,84years old     OB History   None      Home Medications    Prior to Admission medications   Medication Sig Start Date End Date Taking? Authorizing Provider  losartan (COZAAR) 100 MG tablet Take 100 mg by mouth daily.   Yes [provider]  meclizine (ANTIVERT) 12.5 MG tablet Take 1 tablet (12.5 mg total) by mouth 3 (three) times daily as needed for dizziness. 11/29/16  Yes Pisciotta, Joni Reining, PA-C  neomycin-polymyxin b-dexamethasone (MAXITROL) 3.5-10000-0.1 SUSP Place 1 drop into the left eye 4 (four) times daily. 11/24/16  Yes [provider]  acetaminophen (TYLENOL) 500 MG tablet Take 500 mg by mouth every 6 (six) hours as needed for mild pain.    [provider]  albuterol (PROVENTIL HFA;VENTOLIN HFA) 108 (90 BASE) MCG/ACT inhaler Inhale 2 puffs into the lungs every 6 (six) hours as needed for wheezing or shortness of breath.    [provider]  aspirin 81 MG tablet Take 81 mg by mouth daily.      [provider]  CALCIUM CARBONATE-VITAMIN D PO Take 1 tablet by mouth 2 (two) times daily.    [provider]  chlorthalidone (HYGROTON) 50 MG tablet Take 100 mg by mouth daily.    [provider]  cloNIDine (  CATAPRES) 0.2 MG tablet Take 0.2-0.3 mg by mouth See admin instructions. 0.2mg  in am, 0.3mg  in pm    [provider]  diclofenac sodium (VOLTAREN) 1 % GEL Apply 4 g topically 4 (four) times daily. 06/17/17   Melene PlanFloyd, Jahne Krukowski, DO  furosemide (LASIX) 20 MG tablet Take 20 mg by mouth daily.    [provider]  insulin NPH-regular Human (NOVOLIN 70/30) (70-30) 100 UNIT/ML injection Inject 40 Units into the skin 3 (three) times daily.    [provider]  metoprolol (TOPROL-XL) 200 MG 24 hr tablet Take 200 mg by mouth daily.    [provider]  pravastatin (PRAVACHOL) 20 MG tablet Take 20 mg by mouth every Monday, Wednesday, and Friday. On Monday, Wednesday, and Friday.    [provider]      Family History No family history on file.  Social History Social History   Tobacco Use  . Smoking status: Never Smoker  . Smokeless tobacco: Never Used  Substance Use Topics  . Alcohol use: Never    Frequency: Never  . Drug use: Never     Allergies   Ace inhibitors; Amlodipine; and Morphine and related   Review of Systems Review of Systems  Constitutional: Negative for chills and fever.  HENT: Negative for congestion and rhinorrhea.   Eyes: Negative for redness and visual disturbance.  Respiratory: Negative for shortness of breath and wheezing.   Cardiovascular: Negative for chest pain and palpitations.  Gastrointestinal: Negative for nausea and vomiting.  Genitourinary: Negative for dysuria and urgency.  Musculoskeletal: Positive for gait problem and myalgias. Negative for arthralgias.  Skin: Negative for pallor and wound.  Neurological: Negative for dizziness and headaches.     Physical Exam Updated Vital Signs BP (!) 194/61   Pulse 63   Resp 18   Ht 5\' 6"  (1.676 m)   Wt 95.3 kg (210 lb)   SpO2 97%   BMI 33.89 kg/m   Physical Exam  Constitutional: She is oriented to person, place, and time. She appears well-developed and well-nourished. No distress.  HENT:  Head: Normocephalic and atraumatic.  Eyes: Pupils are equal, round, and reactive to light. EOM are normal.  Neck: Normal range of motion. Neck supple.  Cardiovascular: Normal rate and regular rhythm. Exam reveals no gallop and no friction rub.  No murmur heard. Pulmonary/Chest: Effort normal. She has no wheezes. She has no rales.  Abdominal: Soft. She exhibits no distension. There is no tenderness. There is no guarding.  Musculoskeletal: She exhibits tenderness ( Tenderness to palpation of the medial attachment of the hamstring to the tibia.  Pulse motor and sensation is intact distally.  Full range of motion.  2+ edema equal to the other side). She exhibits no edema.  Neurological: She is alert and  oriented to person, place, and time.  Skin: Skin is warm and dry. She is not diaphoretic.  Psychiatric: She has a normal mood and affect. Her behavior is normal.  Nursing note and vitals reviewed.    ED Treatments / Results  Labs (all labs ordered are listed, but only abnormal results are displayed) Labs Reviewed - No data to display  EKG None  Radiology [place] Venous Img Lower Unilateral Right  Result Date: 06/17/2017 CLINICAL DATA:  Right leg swelling EXAM: RIGHT LOWER EXTREMITY VENOUS DOPPLER ULTRASOUND TECHNIQUE: Gray-scale sonography with graded compression, as well as color Doppler and duplex ultrasound were performed to evaluate the lower extremity deep venous systems from the level of the common femoral  vein and including the common femoral, femoral, profunda femoral, popliteal and calf veins including the posterior tibial, peroneal and gastrocnemius veins when visible. The superficial great saphenous vein was also interrogated. Spectral Doppler was utilized to evaluate flow at rest and with distal augmentation maneuvers in the common femoral, femoral and popliteal veins. COMPARISON:  None. FINDINGS: Contralateral Common Femoral Vein: Respiratory phasicity is normal and symmetric with the symptomatic side. No evidence of thrombus. Normal compressibility. Common Femoral Vein: No evidence of thrombus. Normal compressibility, respiratory phasicity and response to augmentation. Saphenofemoral Junction: No evidence of thrombus. Normal compressibility and flow on color Doppler imaging. Profunda Femoral Vein: No evidence of thrombus. Normal compressibility and flow on color Doppler imaging. Femoral Vein: No evidence of thrombus. Normal compressibility, respiratory phasicity and response to augmentation. Popliteal Vein: No evidence of thrombus. Normal compressibility, respiratory phasicity and response to augmentation. Calf Veins: No evidence of thrombus. Normal compressibility and flow on color  Doppler imaging. Superficial Great Saphenous Vein: No evidence of thrombus. Normal compressibility. Venous Reflux:  None. Other Findings:  None. IMPRESSION: No evidence of deep venous thrombosis. Electronically Signed   By: Charlett Nose M.D.   On: 06/17/2017 16:13    Procedures Procedures (including critical care time)  Medications Ordered in ED Medications  oxyCODONE (Oxy IR/ROXICODONE) immediate release tablet 2.5 mg (0 mg Oral Hold 06/17/17 1551)  acetaminophen (TYLENOL) tablet 1,000 mg (1,000 mg Oral Given 06/17/17 1521)     Initial Impression / Assessment and Plan / ED Course  I have reviewed the triage vital signs and the nursing notes.  Pertinent labs & imaging results that were available during my care of the patient were reviewed by me and considered in my medical decision making (see chart for details).     84 yo F with a chief complaint of right leg pain.  This localizes to the attachment of the hamstring to the lower leg.  I suspect that this is a strain.  The patient does have some swelling and is concerned about a blood clot I will obtain an ultrasound.  DVT study negative. D/c home.   4:26 PM:  I have discussed the diagnosis/risks/treatment options with the patient and family and believe the pt to be eligible for discharge home to follow-up with PCP3. We also discussed returning to the ED immediately if new or worsening sx occur. We discussed the sx which are most concerning (e.g., sudden worsening pain, fever, inability to tolerate by mouth) that necessitate immediate return. Medications administered to the patient during their visit and any new prescriptions provided to the patient are listed below.  Medications given during this visit Medications  oxyCODONE (Oxy IR/ROXICODONE) immediate release tablet 2.5 mg (0 mg Oral Hold 06/17/17 1551)  acetaminophen (TYLENOL) tablet 1,000 mg (1,000 mg Oral Given 06/17/17 1521)     The patient appears reasonably screen and/or  stabilized for discharge and I doubt any other medical condition or other [facility] requiring further screening, evaluation, or treatment in the ED at this time prior to discharge.    Final Clinical Impressions(s) / ED Diagnoses   Final diagnoses:  Hamstring strain, right, initial encounter    ED Discharge Orders        Ordered    diclofenac sodium (VOLTAREN) 1 % GEL  4 times daily     06/17/17 1624       Melene Plan, DO 06/17/17 1626

## 2017-06-17 NOTE — ED Notes (Signed)
ED Provider at bedside. 

## 2017-06-17 NOTE — ED Triage Notes (Signed)
Pt c/o right leg pain onset Thursday. Pt denies recent injury. Pt reports unable to put pressure on right leg.

## 2017-06-17 NOTE — Discharge Instructions (Signed)
Try to rest the area. Take tylenol around the clock.  Take tylenol 1000mg (2 extra strength) four times a day.

## 2017-06-18 ENCOUNTER — Telehealth (HOSPITAL_BASED_OUTPATIENT_CLINIC_OR_DEPARTMENT_OTHER): Payer: Self-pay | Admitting: Emergency Medicine

## 2017-06-18 NOTE — Telephone Encounter (Signed)
Pt called ED bc her ins co has refused to pay for the Diclofenac rx she received here yesterday. She said [facility] needed some information but she didn't know what.  I called [facility] at [phone].  The pharmacy staff told me they needed prior authorization and they were going to call pts pmd and work on this for her.  There was nothing they needed from us.  I returned call to pt and informed her.  She verbalized satisfaction with the outcome and appreciation.

## 2017-06-22 ENCOUNTER — Ambulatory Visit
Admission: RE | Admit: 2017-06-22 | Discharge: 2017-06-22 | Disposition: A | Discharge status: Home / Self Care | Payer: Medicare Other | Source: Ambulatory Visit | Attending: Family Medicine | Admitting: Family Medicine

## 2017-06-22 DIAGNOSIS — E2839 Other primary ovarian failure: Secondary | ICD-10-CM

## 2018-03-12 ENCOUNTER — Other Ambulatory Visit: Payer: Self-pay | Admitting: Family Medicine

## 2018-03-12 DIAGNOSIS — Z1231 Encounter for screening mammogram for malignant neoplasm of breast: Secondary | ICD-10-CM

## 2018-04-11 ENCOUNTER — Ambulatory Visit
Admission: RE | Admit: 2018-04-11 | Discharge: 2018-04-11 | Disposition: A | Discharge status: Home / Self Care | Payer: Medicare Other | Source: Ambulatory Visit | Attending: Family Medicine | Admitting: Family Medicine

## 2018-04-11 DIAGNOSIS — Z1231 Encounter for screening mammogram for malignant neoplasm of breast: Secondary | ICD-10-CM

## 2018-04-15 ENCOUNTER — Other Ambulatory Visit: Payer: Self-pay | Admitting: Family Medicine

## 2018-04-15 DIAGNOSIS — R928 Other abnormal and inconclusive findings on diagnostic imaging of breast: Secondary | ICD-10-CM

## 2018-04-24 ENCOUNTER — Ambulatory Visit
Admission: RE | Admit: 2018-04-24 | Discharge: 2018-04-24 | Disposition: A | Discharge status: Home / Self Care | Payer: Medicare Other | Source: Ambulatory Visit | Attending: Family Medicine | Admitting: Family Medicine

## 2018-04-24 DIAGNOSIS — R928 Other abnormal and inconclusive findings on diagnostic imaging of breast: Secondary | ICD-10-CM

## 2019-03-24 ENCOUNTER — Ambulatory Visit: Payer: Medicare Other | Attending: Internal Medicine

## 2019-03-24 DIAGNOSIS — Z23 Encounter for immunization: Secondary | ICD-10-CM | POA: Insufficient documentation

## 2019-03-24 NOTE — Progress Notes (Signed)
   Covid-19 Vaccination Clinic  Name:  [name] [name]    MRN: [id number] DOB: 10/21/1932  03/24/2019  [name] was observed post Covid-19 immunization for 15 minutes without incidence. She was provided with Vaccine Information Sheet and instruction to access the V-Safe system.   [name] was instructed to call 911 with any severe reactions post vaccine: Marland Kitchen Difficulty breathing  . Swelling of your face and throat  . A fast heartbeat  . A bad rash all over your body  . Dizziness and weakness    Immunizations Administered    Name Date Dose VIS Date Route   Pfizer COVID-19 Vaccine 03/24/2019  2:50 PM 0.3 mL 02/07/2019 Intramuscular   Manufacturer: [employer], [employer]   Lot: [id number]   NDC: [address]-2

## 2019-04-14 ENCOUNTER — Ambulatory Visit: Payer: Medicare Other | Attending: Internal Medicine

## 2019-04-14 DIAGNOSIS — Z23 Encounter for immunization: Secondary | ICD-10-CM

## 2019-04-14 NOTE — Progress Notes (Signed)
   Covid-19 Vaccination Clinic  Name:  [name] [name]    MRN: [id number] DOB: [birth year]  04/14/2019  [name] was observed post Covid-19 immunization for 15 minutes without incidence. She was provided with Vaccine Information Sheet and instruction to access the V-Safe system.   [name] was instructed to call 911 with any severe reactions post vaccine: Marland Kitchen Difficulty breathing  . Swelling of your face and throat  . A fast heartbeat  . A bad rash all over your body  . Dizziness and weakness    Immunizations Administered    Name Date Dose VIS Date Route   Pfizer COVID-19 Vaccine 04/14/2019 12:22 PM 0.3 mL 02/07/2019 Intramuscular   Manufacturer: [employer], [employer]   Lot: [id number]   NDC: [address]-4

## 2019-06-03 ENCOUNTER — Other Ambulatory Visit: Payer: Self-pay | Admitting: Family Medicine

## 2019-06-03 DIAGNOSIS — Z1231 Encounter for screening mammogram for malignant neoplasm of breast: Secondary | ICD-10-CM

## 2019-06-11 ENCOUNTER — Emergency Department (HOSPITAL_COMMUNITY): Payer: Medicare Other

## 2019-06-11 ENCOUNTER — Other Ambulatory Visit: Payer: Self-pay

## 2019-06-11 ENCOUNTER — Emergency Department (HOSPITAL_COMMUNITY)
Admission: EM | Admit: 2019-06-11 | Discharge: 2019-06-11 | Disposition: A | Discharge status: Home / Self Care | Payer: Medicare Other | Attending: Emergency Medicine | Admitting: Emergency Medicine

## 2019-06-11 ENCOUNTER — Encounter (HOSPITAL_COMMUNITY): Payer: Self-pay | Admitting: Emergency Medicine

## 2019-06-11 DIAGNOSIS — R319 Hematuria, unspecified: Secondary | ICD-10-CM | POA: Insufficient documentation

## 2019-06-11 DIAGNOSIS — A084 Viral intestinal infection, unspecified: Secondary | ICD-10-CM | POA: Insufficient documentation

## 2019-06-11 DIAGNOSIS — E119 Type 2 diabetes mellitus without complications: Secondary | ICD-10-CM | POA: Insufficient documentation

## 2019-06-11 DIAGNOSIS — Z794 Long term (current) use of insulin: Secondary | ICD-10-CM | POA: Diagnosis not present

## 2019-06-11 DIAGNOSIS — Z79899 Other long term (current) drug therapy: Secondary | ICD-10-CM | POA: Diagnosis not present

## 2019-06-11 DIAGNOSIS — Z7982 Long term (current) use of aspirin: Secondary | ICD-10-CM | POA: Diagnosis not present

## 2019-06-11 DIAGNOSIS — N390 Urinary tract infection, site not specified: Secondary | ICD-10-CM | POA: Diagnosis not present

## 2019-06-11 DIAGNOSIS — I10 Essential (primary) hypertension: Secondary | ICD-10-CM | POA: Diagnosis not present

## 2019-06-11 DIAGNOSIS — R109 Unspecified abdominal pain: Secondary | ICD-10-CM | POA: Diagnosis present

## 2019-06-11 LAB — COMPREHENSIVE METABOLIC PANEL
ALT: 39 U/L (ref 0–44)
AST: 58 U/L — ABNORMAL HIGH (ref 15–41)
Albumin: 4.1 g/dL (ref 3.5–5.0)
Alkaline Phosphatase: 61 U/L (ref 38–126)
Anion gap: 14 (ref 5–15)
BUN: 35 mg/dL — ABNORMAL HIGH (ref 8–23)
CO2: 29 mmol/L (ref 22–32)
Calcium: 9.1 mg/dL (ref 8.9–10.3)
Chloride: 101 mmol/L (ref 98–111)
Creatinine, Ser: 1.42 mg/dL — ABNORMAL HIGH (ref 0.44–1.00)
GFR calc Af Amer: 39 mL/min — ABNORMAL LOW (ref >60)
GFR calc non Af Amer: 34 mL/min — ABNORMAL LOW (ref >60)
Glucose, Bld: 257 mg/dL — ABNORMAL HIGH (ref 70–99)
Potassium: 4.2 mmol/L (ref 3.5–5.1)
Sodium: 144 mmol/L (ref 135–145)
Total Bilirubin: 0.9 mg/dL (ref 0.3–1.2)
Total Protein: 7.9 g/dL (ref 6.5–8.1)

## 2019-06-11 LAB — URINALYSIS, ROUTINE W REFLEX MICROSCOPIC
Bilirubin Urine: NEGATIVE
Glucose, UA: 150 mg/dL — AB
Ketones, ur: 5 mg/dL — AB
Leukocytes,Ua: NEGATIVE
Nitrite: NEGATIVE
Protein, ur: 100 mg/dL — AB
Specific Gravity, Urine: 1.015 (ref 1.005–1.030)
pH: 6 (ref 5.0–8.0)

## 2019-06-11 LAB — CBC
HCT: 48.6 % — ABNORMAL HIGH (ref 36.0–46.0)
Hemoglobin: 15.2 g/dL — ABNORMAL HIGH (ref 12.0–15.0)
MCH: 28.4 pg (ref 26.0–34.0)
MCHC: 31.3 g/dL (ref 30.0–36.0)
MCV: 90.7 fL (ref 80.0–100.0)
Platelets: 250 10*3/uL (ref 150–400)
RBC: 5.36 MIL/uL — ABNORMAL HIGH (ref 3.87–5.11)
RDW: 15 % (ref 11.5–15.5)
WBC: 8 10*3/uL (ref 4.0–10.5)
nRBC: 0 % (ref 0.0–0.2)

## 2019-06-11 LAB — CBG MONITORING, ED
Glucose-Capillary: 178 mg/dL — ABNORMAL HIGH (ref 70–99)
Glucose-Capillary: 216 mg/dL — ABNORMAL HIGH (ref 70–99)

## 2019-06-11 LAB — LIPASE, BLOOD: Lipase: 21 U/L (ref 11–51)

## 2019-06-11 MED ORDER — SODIUM CHLORIDE 0.9% FLUSH: 3.0000 mL | Freq: Once | INTRAVENOUS | Status: DC

## 2019-06-11 MED ORDER — SODIUM CHLORIDE 0.9 % IV BOLUS
1000.0000 mL | Freq: Once | INTRAVENOUS | Status: AC
  Administered 2019-06-11: 1000 mL via INTRAVENOUS

## 2019-06-11 MED ORDER — ONDANSETRON 4 MG PO TBDP: 4.0000 mg | ORAL_TABLET | Freq: Three times a day (TID) | ORAL | 0 refills | Status: DC | PRN

## 2019-06-11 MED ORDER — FENTANYL CITRATE (PF) 100 MCG/2ML IJ SOLN
50.0000 ug | Freq: Once | INTRAMUSCULAR | Status: AC
  Administered 2019-06-11: 50 ug via INTRAVENOUS
  Filled 2019-06-11: qty 2

## 2019-06-11 MED ORDER — CEPHALEXIN 500 MG PO CAPS: 500.0000 mg | ORAL_CAPSULE | Freq: Three times a day (TID) | ORAL | 0 refills | Status: AC

## 2019-06-11 MED ORDER — ONDANSETRON HCL 4 MG/2ML IJ SOLN
4.0000 mg | Freq: Once | INTRAMUSCULAR | Status: AC
  Administered 2019-06-11: 4 mg via INTRAVENOUS
  Filled 2019-06-11: qty 2

## 2019-06-11 NOTE — ED Triage Notes (Signed)
Pt complaint of abdominal pain throughout the night with n/v/d. "Started after having a frozen dinner last night."

## 2019-06-11 NOTE — ED Notes (Signed)
Pt SpO2 dropped to 88% on RA while resting after administration of Fentanyl. Pt placed on 3L O2 and SpO2 bumped up to 96%.

## 2019-06-11 NOTE — Discharge Instructions (Signed)
Recommend taking antibiotic as prescribed for possible urinary tract infection.  Recommend taking Zofran as needed for further episodes of nausea.  If you develop worsening vomiting, abdominal pain, fever, return to ER for reassessment.  Otherwise recommend recheck with your primary doctor later this week.

## 2019-06-11 NOTE — ED Provider Notes (Signed)
[facility]-EMERGENCY DEPT Provider Note   CSN: [id number] Arrival date & time: 06/11/19  1045     History Chief Complaint  Patient presents with  . Abdominal Pain    [name] [name] is a 86 y.o. female.  Presents to emergency room with chief complaint abdominal pain.  Yesterday for dinner eat frozen dinner, felt somewhat uneasy on her stomach.  Throughout the evening having worsening pain.  Then developed nausea, vomiting and diarrhea.  Throughout the day has been steadily worsening.  Pain is episodic, dull, crampy.  Currently mild but up to severe.  Has not taken any medication for this.  Has had few episodes of vomiting today had a few episodes of loose stools.  Nonbloody, nonbilious emesis.  No blood in stool.  History of cholecystectomy, partial hysterectomy, no other abdominal surgical history.  Type 2 diabetes, hyperlipidemia and hypertension.  HPI     Past Medical History:  Diagnosis Date  . Colon polyp   . Diabetes mellitus 1992  . Hypertension 1996  . Pancreatitis     Patient Active Problem List   Diagnosis Date Noted  . DIABETES MELLITUS-TYPE II 06/01/2009  . HYPERLIPIDEMIA 06/01/2009  . HYPERTENSION 06/01/2009  . CHOLELITHIASIS 06/01/2009  . PANCREATITIS 06/01/2009  . PERSONAL HX COLONIC POLYPS 06/01/2009    Past Surgical History:  Procedure Laterality Date  . CHOLECYSTECTOMY    . COLONOSCOPY    . ERCP    . PARTIAL HYSTERECTOMY  86years old  . POLYPECTOMY    . UPPER GASTROINTESTINAL ENDOSCOPY    . WRIST FRACTURE SURGERY  left arm,86years old     OB History   No obstetric history on file.     No family history on file.  Social History   Tobacco Use  . Smoking status: Never Smoker  . Smokeless tobacco: Never Used  Substance Use Topics  . Alcohol use: Never  . Drug use: Never    Home Medications Prior to Admission medications   Medication Sig Start Date End Date Taking? Authorizing Provider  acetaminophen (TYLENOL)  500 MG tablet Take 500 mg by mouth every 6 (six) hours as needed for mild pain.   Yes [provider]  albuterol (PROVENTIL HFA;VENTOLIN HFA) 108 (90 BASE) MCG/ACT inhaler Inhale 2 puffs into the lungs every 6 (six) hours as needed for wheezing or shortness of breath.   Yes [provider]  aspirin 81 MG tablet Take 81 mg by mouth daily.     Yes [provider]  CALCIUM CARBONATE-VITAMIN D PO Take 1 tablet by mouth 2 (two) times daily.   Yes [provider]  chlorthalidone (HYGROTON) 50 MG tablet Take 150 mg by mouth daily.    Yes [provider]  cloNIDine (CATAPRES) 0.2 MG tablet Take 0.2-0.3 mg by mouth See admin instructions. 0.2mg  in am, 0.3mg  in pm   Yes [provider]  furosemide (LASIX) 20 MG tablet Take 20 mg by mouth daily.   Yes [provider]  insulin NPH-regular Human (NOVOLIN 70/30) (70-30) 100 UNIT/ML injection Inject 70-80 Units into the skin 3 (three) times daily. 80 unitsin AM 80units at lunch  70 units QHS   Yes [provider]  metoprolol (TOPROL-XL) 200 MG 24 hr tablet Take 200 mg by mouth daily.   Yes [provider]  Potassium Chloride ER 20 MEQ TBCR Take 1 tablet by mouth daily. 05/18/19  Yes [provider]  pravastatin (PRAVACHOL) 20 MG tablet Take 20 mg by mouth  every Monday, Wednesday, and Friday. On Monday, Wednesday, and Friday.   Yes [provider]  cephALEXin (KEFLEX) 500 MG capsule Take 1 capsule (500 mg total) by mouth 3 (three) times daily for 5 days. 06/11/19 06/16/19  Milagros Loll, MD  diclofenac sodium (VOLTAREN) 1 % GEL Apply 4 g topically 4 (four) times daily. Patient not taking: Reported on 06/11/2019 06/17/17   Melene Plan, DO  meclizine (ANTIVERT) 12.5 MG tablet Take 1 tablet (12.5 mg total) by mouth 3 (three) times daily as needed for dizziness. Patient not taking: Reported on 06/11/2019 11/29/16   Pisciotta, Joni Reining, PA-C  ondansetron (ZOFRAN ODT) 4 MG  disintegrating tablet Take 1 tablet (4 mg total) by mouth every 8 (eight) hours as needed for nausea or vomiting. 06/11/19   Milagros Loll, MD    Allergies    Ace inhibitors, Amlodipine, and Morphine and related  Review of Systems   Review of Systems  Constitutional: Negative for chills and fever.  HENT: Negative for ear pain and sore throat.   Eyes: Negative for pain and visual disturbance.  Respiratory: Negative for cough and shortness of breath.   Cardiovascular: Negative for chest pain and palpitations.  Gastrointestinal: Positive for abdominal pain, nausea and vomiting.  Genitourinary: Negative for dysuria and hematuria.  Musculoskeletal: Negative for arthralgias and back pain.  Skin: Negative for color change and rash.  Neurological: Negative for seizures and syncope.  All other systems reviewed and are negative.   Physical Exam Updated Vital Signs BP (!) 189/70   Pulse 91   Temp 98.3 F (36.8 C) (Oral)   Resp 18   Ht 5\' 6"  (1.676 m)   Wt 101.2 kg   SpO2 93%   BMI 35.99 kg/m   Physical Exam Vitals and nursing note reviewed.  Constitutional:      General: She is not in acute distress.    Appearance: She is well-developed.  HENT:     Head: Normocephalic and atraumatic.  Eyes:     Conjunctiva/sclera: Conjunctivae normal.  Cardiovascular:     Rate and Rhythm: Normal rate and regular rhythm.     Heart sounds: No murmur.  Pulmonary:     Effort: Pulmonary effort is normal. No respiratory distress.     Breath sounds: Normal breath sounds.  Abdominal:     General: Abdomen is flat.     Palpations: Abdomen is soft.     Comments: TTP across lower abd, no rebound or guarding  Musculoskeletal:     Cervical back: Neck supple.  Skin:    General: Skin is warm and dry.     Capillary Refill: Capillary refill takes less than 2 seconds.  Neurological:     Mental Status: She is alert.     ED Results / Procedures / Treatments   Labs (all labs ordered are listed,  but only abnormal results are displayed) Labs Reviewed  COMPREHENSIVE METABOLIC PANEL - Abnormal; Notable for the following components:      Result Value   Glucose, Bld 257 (*)    BUN 35 (*)    Creatinine, Ser 1.42 (*)    AST 58 (*)    GFR calc non Af Amer 34 (*)    GFR calc Af Amer 39 (*)    All other components within normal limits  CBC - Abnormal; Notable for the following components:   RBC 5.36 (*)    Hemoglobin 15.2 (*)    HCT 48.6 (*)    All other components within  normal limits  URINALYSIS, ROUTINE W REFLEX MICROSCOPIC - Abnormal; Notable for the following components:   Glucose, UA 150 (*)    Hgb urine dipstick SMALL (*)    Ketones, ur 5 (*)    Protein, ur 100 (*)    Bacteria, UA MANY (*)    All other components within normal limits  CBG MONITORING, ED - Abnormal; Notable for the following components:   Glucose-Capillary 178 (*)    All other components within normal limits  CBG MONITORING, ED - Abnormal; Notable for the following components:   Glucose-Capillary 216 (*)    All other components within normal limits  LIPASE, BLOOD  CBG MONITORING, ED    EKG EKG Interpretation  Date/Time:  Wednesday June 11 2019 16:29:42 EDT Ventricular Rate:  92 PR Interval:    QRS Duration: 96 QT Interval:  386 QTC Calculation: 478 R Axis:   62 Text Interpretation: Sinus rhythm Nonspecific repol abnormality, diffuse leads Confirmed by Marianna Fuss ([id number]) on 06/11/2019 4:48:04 PM   Radiology CT ABDOMEN PELVIS WO CONTRAST  Result Date: 06/11/2019 CLINICAL DATA:  Abdominal pain generalized worse at night with nausea, vomiting and diarrhea. Began last night after eating frozen dinner. EXAM: CT ABDOMEN AND PELVIS WITHOUT CONTRAST TECHNIQUE: Multidetector CT imaging of the abdomen and pelvis was performed following the standard protocol without IV contrast. COMPARISON:  05/26/2009 and 02/05/2018 FINDINGS: Lower chest: Lung bases are clear. Calcification over the left anterior  descending and lateral circumflex coronary arteries. Calcified plaque over the descending thoracic aorta. Hepatobiliary: Previous cholecystectomy. Liver and biliary tree are normal. Pancreas: Normal. Spleen: Normal. Adrenals/Urinary Tract: Adrenal glands are normal. Kidneys are normal in size without evidence of hydronephrosis or nephrolithiasis. 1.3 cm cyst over the upper pole right kidney unchanged. Subtle subcentimeter hypodense nodule over the upper pole right kidney too small to characterize but likely a hyperdense cyst and not significantly changed. Ureters are normal. Small focus of air over the nondependent portion of the bladder. Bladder is otherwise unremarkable. Stomach/Bowel: Stomach and small bowel are normal. Appendix is normal. Colon is normal. Vascular/Lymphatic: Mild calcified plaque over the abdominal aorta which is normal in caliber. No adenopathy. Reproductive: Previous hysterectomy.  Adnexa unremarkable. Other: No free fluid or focal inflammatory change. Musculoskeletal: Stable mild L3 compression fracture. IMPRESSION: 1.  No acute findings in the abdomen/pelvis. 2. Small focus of air over the nondependent portion of bladder. This is most commonly due to recent instrumentation, although could be seen due to infection or enterovesical fistula. Recommend clinical correlation. 3. 1.3 cm right renal cyst unchanged. Subcentimeter hyperdense nodule over the upper pole right kidney too small to characterize but likely a hyperdense cyst and unchanged. Recommend follow-up pre and post contrast CT 1 year. 4. Aortic Atherosclerosis (ICD10-I70.0). Atherosclerotic coronary artery disease. 5.  Stable mild L3 compression fracture. Electronically Signed   By: Elberta Fortis M.D.   On: 06/11/2019 16:50    Procedures Procedures (including critical care time)  Medications Ordered in ED Medications  ondansetron (ZOFRAN) injection 4 mg (4 mg Intravenous Given 06/11/19 1553)  fentaNYL (SUBLIMAZE) injection 50  mcg (50 mcg Intravenous Given 06/11/19 1553)  sodium chloride 0.9 % bolus 1,000 mL (1,000 mLs Intravenous New Bag/Given (Non-Interop) 06/11/19 1553)    ED Course  I have reviewed the triage vital signs and the nursing notes.  Pertinent labs & imaging results that were available during my care of the patient were reviewed by me and considered in my medical decision making (see chart for details).  Clinical Course as of Jun 10 1901  Wed Jun 11, 2019  1533 Complete initial assessment   [RD]    Clinical Course User Index [RD] Lucrezia Starch, MD   MDM Rules/Calculators/A&P                      86 year old lady presenting to ER with complaints of abdominal pain, nausea vomiting and diarrhea.  On exam patient noted to be well-appearing with stable vital signs.  Did note some mild tenderness across her lower abdomen.  CT scan ordered to rule out acute abdominal process, diverticulitis or other.  CT negative.  Labs grossly within normal limits, creatinine at baseline.  UA with many bacteria but negative nitrites and negative leukocytes.  Patient had reported some urgency/frequency, will treat for possible UTI.  Provided Rx for cephalexin and Zofran.  Patient symptoms greatly improved after receiving Zofran and fentanyl and fluids.  Tolerating p.o. without difficulty.  Believe appropriate for outpatient management at this time.  Recommend recheck with primary doctor.  Discussed incidental finding of kidney nodule, patient reports she is aware of this and is followed by her primary doctor and kidney specialist.    After the discussed management above, the patient was determined to be safe for discharge.  The patient was in agreement with this plan and all questions regarding their care were answered.  ED return precautions were discussed and the patient will return to the ED with any significant worsening of condition.   Final Clinical Impression(s) / ED Diagnoses Final diagnoses:  Viral  gastroenteritis  Urinary tract infection with hematuria, site unspecified    Rx / DC Orders ED Discharge Orders         Ordered    ondansetron (ZOFRAN ODT) 4 MG disintegrating tablet  Every 8 hours PRN     06/11/19 1846    cephALEXin (KEFLEX) 500 MG capsule  3 times daily     06/11/19 1846           Lucrezia Starch, MD 06/11/19 1904

## 2019-06-11 NOTE — ED Notes (Signed)
Pt transported to CT ?

## 2019-06-18 ENCOUNTER — Ambulatory Visit: Payer: Medicare Other

## 2019-06-25 ENCOUNTER — Ambulatory Visit
Admission: RE | Admit: 2019-06-25 | Discharge: 2019-06-25 | Disposition: A | Discharge status: Home / Self Care | Payer: Medicare Other | Source: Ambulatory Visit | Attending: Family Medicine | Admitting: Family Medicine

## 2019-06-25 ENCOUNTER — Other Ambulatory Visit: Payer: Self-pay

## 2019-06-25 DIAGNOSIS — Z1231 Encounter for screening mammogram for malignant neoplasm of breast: Secondary | ICD-10-CM

## 2019-07-07 ENCOUNTER — Other Ambulatory Visit: Payer: Self-pay | Admitting: Family Medicine

## 2019-07-07 DIAGNOSIS — E2839 Other primary ovarian failure: Secondary | ICD-10-CM

## 2019-09-24 ENCOUNTER — Other Ambulatory Visit: Payer: Self-pay

## 2019-09-24 ENCOUNTER — Ambulatory Visit
Admission: RE | Admit: 2019-09-24 | Discharge: 2019-09-24 | Disposition: A | Discharge status: Home / Self Care | Payer: Medicare Other | Source: Ambulatory Visit | Attending: Family Medicine | Admitting: Family Medicine

## 2019-09-24 DIAGNOSIS — E2839 Other primary ovarian failure: Secondary | ICD-10-CM

## 2019-12-10 DIAGNOSIS — H35372 Puckering of macula, left eye: Secondary | ICD-10-CM | POA: Insufficient documentation

## 2019-12-10 DIAGNOSIS — Z961 Presence of intraocular lens: Secondary | ICD-10-CM | POA: Insufficient documentation

## 2019-12-18 ENCOUNTER — Ambulatory Visit (INDEPENDENT_AMBULATORY_CARE_PROVIDER_SITE_OTHER): Payer: Medicare Other | Admitting: Ophthalmology

## 2019-12-18 ENCOUNTER — Encounter (INDEPENDENT_AMBULATORY_CARE_PROVIDER_SITE_OTHER): Payer: Self-pay | Admitting: Ophthalmology

## 2019-12-18 ENCOUNTER — Other Ambulatory Visit: Payer: Self-pay

## 2019-12-18 DIAGNOSIS — H35372 Puckering of macula, left eye: Secondary | ICD-10-CM | POA: Diagnosis not present

## 2019-12-18 DIAGNOSIS — E113293 Type 2 diabetes mellitus with mild nonproliferative diabetic retinopathy without macular edema, bilateral: Secondary | ICD-10-CM | POA: Diagnosis not present

## 2019-12-18 DIAGNOSIS — Z961 Presence of intraocular lens: Secondary | ICD-10-CM | POA: Diagnosis not present

## 2019-12-18 NOTE — Assessment & Plan Note (Signed)
The nature of mild nonproliferative diabetic retinopathy was discussed with the patient. Emphasis was placed on tight glucose, blood pressure, and serum lipid control. Avoidance of smoking was emphasized. Maintenance of normal body weight was emphasized. Appropriate follow up dilated exam is 1 year. 

## 2019-12-18 NOTE — Progress Notes (Signed)
12/18/2019     CHIEF COMPLAINT Patient presents for Retina Follow Up   HISTORY OF PRESENT ILLNESS: [name] [name] is a 86 y.o. female who presents to the clinic today for:   HPI    Retina Follow Up    Patient presents with  Diabetic Retinopathy.  In both eyes.  This started 9 months ago.  Severity is mild.  Duration of 9 months.  Since onset it is stable.          Comments    9 Month Diabetic F/U OU  Pt c/o dryness and burning OU. Pt denies changes to [place] OU. A1c: 8.2, 11/21/2019 LBS: 104 this AM       Last edited by Ileana Roup, COA on 12/18/2019 10:49 AM. (History)      Referring physician: [name], MD [address] [place],  [place] [address]  HISTORICAL INFORMATION:   Selected notes from the MEDICAL RECORD NUMBER       CURRENT MEDICATIONS: No current outpatient medications on file. (Ophthalmic Drugs)   No current facility-administered medications for this visit. (Ophthalmic Drugs)   Current Outpatient Medications (Other)  Medication Sig  . acetaminophen (TYLENOL) 500 MG tablet Take 500 mg by mouth every 6 (six) hours as needed for mild pain.  Marland Kitchen albuterol (PROVENTIL HFA;VENTOLIN HFA) 108 (90 BASE) MCG/ACT inhaler Inhale 2 puffs into the lungs every 6 (six) hours as needed for wheezing or shortness of breath.  Marland Kitchen aspirin 81 MG tablet Take 81 mg by mouth daily.    Marland Kitchen CALCIUM CARBONATE-VITAMIN D PO Take 1 tablet by mouth 2 (two) times daily.  . chlorthalidone (HYGROTON) 50 MG tablet Take 150 mg by mouth daily.   . cloNIDine (CATAPRES) 0.2 MG tablet Take 0.2-0.3 mg by mouth See admin instructions. 0.2mg  in am, 0.3mg  in pm  . diclofenac sodium (VOLTAREN) 1 % GEL Apply 4 g topically 4 (four) times daily. (Patient not taking: Reported on 06/11/2019)  . furosemide (LASIX) 20 MG tablet Take 20 mg by mouth daily.  . insulin NPH-regular Human (NOVOLIN 70/30) (70-30) 100 UNIT/ML injection Inject 70-80 Units into the skin 3 (three) times daily. 80  unitsin AM 80units at lunch  70 units QHS  . meclizine (ANTIVERT) 12.5 MG tablet Take 1 tablet (12.5 mg total) by mouth 3 (three) times daily as needed for dizziness. (Patient not taking: Reported on 06/11/2019)  . metoprolol (TOPROL-XL) 200 MG 24 hr tablet Take 200 mg by mouth daily.  . ondansetron (ZOFRAN ODT) 4 MG disintegrating tablet Take 1 tablet (4 mg total) by mouth every 8 (eight) hours as needed for nausea or vomiting.  . Potassium Chloride ER 20 MEQ TBCR Take 1 tablet by mouth daily.  . pravastatin (PRAVACHOL) 20 MG tablet Take 20 mg by mouth every Monday, Wednesday, and Friday. On Monday, Wednesday, and Friday.   No current facility-administered medications for this visit. (Other)      REVIEW OF SYSTEMS:    ALLERGIES Allergies  Allergen Reactions  . Ace Inhibitors Cough  . Amlodipine Other (See Comments)    Other reaction(s): EDEMA  . Morphine And Related Rash    For long periods of time    PAST MEDICAL HISTORY Past Medical History:  Diagnosis Date  . Colon polyp   . Diabetes mellitus 1992  . Hypertension 1996  . Pancreatitis    Past Surgical History:  Procedure Laterality Date  . CHOLECYSTECTOMY    . COLONOSCOPY    . ERCP    .  PARTIAL HYSTERECTOMY  86years old  . POLYPECTOMY    . UPPER GASTROINTESTINAL ENDOSCOPY    . WRIST FRACTURE SURGERY  left arm,86years old    FAMILY HISTORY History reviewed. No pertinent family history.  SOCIAL HISTORY Social History   Tobacco Use  . Smoking status: Never Smoker  . Smokeless tobacco: Never Used  Vaping Use  . Vaping Use: Never used  Substance Use Topics  . Alcohol use: Never  . Drug use: Never         OPHTHALMIC EXAM:  Base Eye Exam    Visual Acuity (ETDRS)      Right Left   Dist [place] 20/30 -1 20/40 +1   Dist ph  20/25 -1 NI       Tonometry (Tonopen, 10:51 AM)      Right Left   Pressure 15 17       Pupils      Pupils Dark Light Shape React APD   Right PERRL 3 2 Round Brisk None   Left  PERRL 3 2 Round Brisk None       Visual Fields (Counting fingers)      Left Right    Full Full       Extraocular Movement      Right Left    Full Full       Neuro/Psych    Oriented x3: Yes   Mood/Affect: Normal       Dilation    Both eyes: 1.0% Mydriacyl, 2.5% Phenylephrine @ 10:54 AM        Slit Lamp and Fundus Exam    External Exam      Right Left   External Normal Normal       Slit Lamp Exam      Right Left   Lids/Lashes Normal Ptosis   Conjunctiva/Sclera White and quiet White and quiet   Cornea Clear Clear   Anterior Chamber Deep and quiet Deep and quiet   Iris Round and reactive Round and reactive   Lens Posterior chamber intraocular lens Posterior chamber intraocular lens   Anterior Vitreous Normal Normal       Fundus Exam      Right Left   Posterior Vitreous Normal Normal   Disc Normal Normal   C/D Ratio 0.7 0.6   Macula Microaneurysms Epiretinal membrane mild, Microaneurysms   Vessels NPDR- Mild NPDR- Mild   Periphery Normal Normal          IMAGING AND PROCEDURES  Imaging and Procedures for 12/18/19  Color Fundus Photography Optos - OU - Both Eyes       Right Eye Progression has been stable. Disc findings include increased cup to disc ratio. Macula : microaneurysms. Periphery : normal observations.   Left Eye Progression has been stable. Disc findings include increased cup to disc ratio. Macula : microaneurysms. Periphery : normal observations.   Notes Mild nonproliferative diabetic retinopathy with small microaneurysms parafoveal OU.  We will monitor                ASSESSMENT/PLAN:  Mild nonproliferative diabetic retinopathy of both eyes (HCC) The nature of mild nonproliferative diabetic retinopathy was discussed with the patient. Emphasis was placed on tight glucose, blood pressure, and serum lipid control. Avoidance of smoking was emphasized. Maintenance of normal body weight was emphasized. Appropriate follow up dilated exam  is 1 year.  Left epiretinal membrane The nature of macular pucker (epiretinal membrane ERM) was discussed with the patient as well as threshold criteria for  vitrectomy surgery. I explained that in rare cases another surgery is needed to actually remove a second wrinkle should it regrow.  Most often, the epiretinal membrane and underlying wrinkled internal limiting membrane are removed with the first surgery, to accomplish the goals.   If the operative eye is [place] (natural lens still present), cataract surgery is often recommended prior to Vitrectomy. This will enable the retina surgeon to have the best view during surgery and the patient to obtain optimal results in the future. Treatment options were discussed.      ICD-10-CM   1. Left epiretinal membrane  H35.372 Color Fundus Photography Optos - OU - Both Eyes  2. Pseudophakia, both eyes  Z96.1   3. Mild nonproliferative diabetic retinopathy of both eyes without macular edema associated with type 2 diabetes mellitus (HCC)  I77.8242     1.  OS, no impact from mild epiretinal membrane will monitor next visit with an OCT  2.  Mild nonproliferative diabetic retinopathy highlighted by only microaneurysms seen around the macula.  This does raise the specter of macular telangiectasis, and the association that I personally believe is related to possible sleep apnea.  Her review of systems for sleep apnea is currently negative.  And will thus not proceed with any further evaluation although body  habitus is a risk  3.  No visual acuity impact currently from the epiretinal membrane at this time.  Continue blood sugar control monitoring is important for stabilization of the diabetic retinopathy.  Ophthalmic Meds Ordered this visit:  No orders of the defined types were placed in this encounter.      Return in about 9 months (around 09/16/2020) for OCT, DILATE OU.  There are no Patient Instructions on file for this visit.   Explained the diagnoses,  plan, and follow up with the patient and they expressed understanding.  Patient expressed understanding of the importance of proper follow up care.   Alford Highland Mame Twombly M.D. Diseases & Surgery of the Retina and Vitreous [facility] 12/18/19     Abbreviations: M myopia (nearsighted); A astigmatism; H hyperopia (farsighted); P presbyopia; Mrx spectacle prescription;  CTL contact lenses; OD right eye; OS left eye; OU both eyes  XT exotropia; ET esotropia; PEK punctate epithelial keratitis; PEE punctate epithelial erosions; DES dry eye syndrome; MGD meibomian gland dysfunction; ATs artificial tears; PFAT's preservative free artificial tears; NSC nuclear sclerotic cataract; PSC posterior subcapsular cataract; ERM epi-retinal membrane; PVD posterior vitreous detachment; RD retinal detachment; DM diabetes mellitus; DR diabetic retinopathy; NPDR non-proliferative diabetic retinopathy; PDR proliferative diabetic retinopathy; CSME clinically significant macular edema; DME diabetic macular edema; dbh dot blot hemorrhages; CWS cotton wool spot; POAG primary open angle glaucoma; C/D cup-to-disc ratio; HVF humphrey visual field; GVF goldmann visual field; OCT optical coherence tomography; IOP intraocular pressure; BRVO Branch retinal vein occlusion; CRVO central retinal vein occlusion; CRAO central retinal artery occlusion; BRAO branch retinal artery occlusion; RT retinal tear; SB scleral buckle; PPV pars plana vitrectomy; VH Vitreous hemorrhage; PRP panretinal laser photocoagulation; IVK intravitreal kenalog; VMT vitreomacular traction; MH Macular hole;  NVD neovascularization of the disc; NVE neovascularization elsewhere; AREDS age related eye disease study; ARMD age related macular degeneration; POAG primary open angle glaucoma; EBMD epithelial/anterior basement membrane dystrophy; ACIOL anterior chamber intraocular lens; IOL intraocular lens; PCIOL posterior chamber intraocular lens; Phaco/IOL  phacoemulsification with intraocular lens placement; PRK photorefractive keratectomy; LASIK laser assisted in situ keratomileusis; HTN hypertension; DM diabetes mellitus; COPD chronic obstructive pulmonary disease

## 2019-12-18 NOTE — Assessment & Plan Note (Signed)

## 2019-12-27 ENCOUNTER — Ambulatory Visit: Payer: Medicare Other | Attending: Internal Medicine

## 2019-12-27 DIAGNOSIS — Z23 Encounter for immunization: Secondary | ICD-10-CM

## 2019-12-27 NOTE — Progress Notes (Signed)
   Covid-19 Vaccination Clinic  Name:  [name] [name]    MRN: [id number] DOB: Jul 28, 1932  12/27/2019  [name] [name] was observed post Covid-19 immunization for 15 minutes without incident. She was provided with Vaccine Information Sheet and instruction to access the V-Safe system.   [name] [name] was instructed to call [phone] with any severe reactions post vaccine: Marland Kitchen Difficulty breathing  . Swelling of face and throat  . A fast heartbeat  . A bad rash all over body  . Dizziness and weakness

## 2020-01-04 ENCOUNTER — Emergency Department (HOSPITAL_COMMUNITY): Payer: Medicare Other

## 2020-01-04 ENCOUNTER — Inpatient Hospital Stay (HOSPITAL_COMMUNITY): Payer: Medicare Other

## 2020-01-04 ENCOUNTER — Inpatient Hospital Stay (HOSPITAL_COMMUNITY)
Admission: EM | Admit: 2020-01-04 | Discharge: 2020-01-16 | DRG: 064 | Disposition: A | Discharge status: Rehabilitation Facility | Payer: Medicare Other | Attending: Internal Medicine | Admitting: Internal Medicine

## 2020-01-04 DIAGNOSIS — E872 Acidosis: Secondary | ICD-10-CM | POA: Diagnosis not present

## 2020-01-04 DIAGNOSIS — I611 Nontraumatic intracerebral hemorrhage in hemisphere, cortical: Secondary | ICD-10-CM | POA: Diagnosis not present

## 2020-01-04 DIAGNOSIS — G928 Other toxic encephalopathy: Secondary | ICD-10-CM | POA: Diagnosis not present

## 2020-01-04 DIAGNOSIS — E1122 Type 2 diabetes mellitus with diabetic chronic kidney disease: Secondary | ICD-10-CM | POA: Diagnosis present

## 2020-01-04 DIAGNOSIS — J9600 Acute respiratory failure, unspecified whether with hypoxia or hypercapnia: Secondary | ICD-10-CM

## 2020-01-04 DIAGNOSIS — R1312 Dysphagia, oropharyngeal phase: Secondary | ICD-10-CM | POA: Diagnosis present

## 2020-01-04 DIAGNOSIS — I6389 Other cerebral infarction: Secondary | ICD-10-CM | POA: Diagnosis not present

## 2020-01-04 DIAGNOSIS — Z9889 Other specified postprocedural states: Secondary | ICD-10-CM

## 2020-01-04 DIAGNOSIS — J189 Pneumonia, unspecified organism: Secondary | ICD-10-CM | POA: Diagnosis not present

## 2020-01-04 DIAGNOSIS — E113293 Type 2 diabetes mellitus with mild nonproliferative diabetic retinopathy without macular edema, bilateral: Secondary | ICD-10-CM | POA: Diagnosis present

## 2020-01-04 DIAGNOSIS — R569 Unspecified convulsions: Secondary | ICD-10-CM | POA: Diagnosis not present

## 2020-01-04 DIAGNOSIS — R509 Fever, unspecified: Secondary | ICD-10-CM | POA: Diagnosis present

## 2020-01-04 DIAGNOSIS — Z90711 Acquired absence of uterus with remaining cervical stump: Secondary | ICD-10-CM

## 2020-01-04 DIAGNOSIS — E785 Hyperlipidemia, unspecified: Secondary | ICD-10-CM | POA: Diagnosis present

## 2020-01-04 DIAGNOSIS — Z8249 Family history of ischemic heart disease and other diseases of the circulatory system: Secondary | ICD-10-CM

## 2020-01-04 DIAGNOSIS — J9601 Acute respiratory failure with hypoxia: Secondary | ICD-10-CM | POA: Diagnosis present

## 2020-01-04 DIAGNOSIS — Z66 Do not resuscitate: Secondary | ICD-10-CM | POA: Diagnosis not present

## 2020-01-04 DIAGNOSIS — D696 Thrombocytopenia, unspecified: Secondary | ICD-10-CM | POA: Diagnosis not present

## 2020-01-04 DIAGNOSIS — I619 Nontraumatic intracerebral hemorrhage, unspecified: Secondary | ICD-10-CM | POA: Diagnosis present

## 2020-01-04 DIAGNOSIS — I6523 Occlusion and stenosis of bilateral carotid arteries: Secondary | ICD-10-CM | POA: Diagnosis present

## 2020-01-04 DIAGNOSIS — R5383 Other fatigue: Secondary | ICD-10-CM

## 2020-01-04 DIAGNOSIS — Z794 Long term (current) use of insulin: Secondary | ICD-10-CM | POA: Diagnosis not present

## 2020-01-04 DIAGNOSIS — R29810 Facial weakness: Secondary | ICD-10-CM | POA: Diagnosis present

## 2020-01-04 DIAGNOSIS — Z885 Allergy status to narcotic agent status: Secondary | ICD-10-CM

## 2020-01-04 DIAGNOSIS — R1310 Dysphagia, unspecified: Secondary | ICD-10-CM

## 2020-01-04 DIAGNOSIS — G936 Cerebral edema: Secondary | ICD-10-CM | POA: Diagnosis present

## 2020-01-04 DIAGNOSIS — G8191 Hemiplegia, unspecified affecting right dominant side: Secondary | ICD-10-CM | POA: Diagnosis present

## 2020-01-04 DIAGNOSIS — Z9911 Dependence on respirator [ventilator] status: Secondary | ICD-10-CM | POA: Diagnosis not present

## 2020-01-04 DIAGNOSIS — E876 Hypokalemia: Secondary | ICD-10-CM | POA: Diagnosis not present

## 2020-01-04 DIAGNOSIS — I639 Cerebral infarction, unspecified: Secondary | ICD-10-CM | POA: Diagnosis not present

## 2020-01-04 DIAGNOSIS — I130 Hypertensive heart and chronic kidney disease with heart failure and stage 1 through stage 4 chronic kidney disease, or unspecified chronic kidney disease: Secondary | ICD-10-CM | POA: Diagnosis present

## 2020-01-04 DIAGNOSIS — Z7982 Long term (current) use of aspirin: Secondary | ICD-10-CM

## 2020-01-04 DIAGNOSIS — R0989 Other specified symptoms and signs involving the circulatory and respiratory systems: Secondary | ICD-10-CM | POA: Diagnosis not present

## 2020-01-04 DIAGNOSIS — K5901 Slow transit constipation: Secondary | ICD-10-CM | POA: Diagnosis not present

## 2020-01-04 DIAGNOSIS — N183 Chronic kidney disease, stage 3 unspecified: Secondary | ICD-10-CM | POA: Diagnosis not present

## 2020-01-04 DIAGNOSIS — R739 Hyperglycemia, unspecified: Secondary | ICD-10-CM | POA: Diagnosis not present

## 2020-01-04 DIAGNOSIS — I69391 Dysphagia following cerebral infarction: Secondary | ICD-10-CM | POA: Diagnosis not present

## 2020-01-04 DIAGNOSIS — N1832 Chronic kidney disease, stage 3b: Secondary | ICD-10-CM | POA: Diagnosis present

## 2020-01-04 DIAGNOSIS — Z6841 Body Mass Index (BMI) 40.0 and over, adult: Secondary | ICD-10-CM | POA: Diagnosis not present

## 2020-01-04 DIAGNOSIS — J9602 Acute respiratory failure with hypercapnia: Secondary | ICD-10-CM | POA: Diagnosis present

## 2020-01-04 DIAGNOSIS — M1711 Unilateral primary osteoarthritis, right knee: Secondary | ICD-10-CM | POA: Diagnosis not present

## 2020-01-04 DIAGNOSIS — R4701 Aphasia: Secondary | ICD-10-CM | POA: Diagnosis present

## 2020-01-04 DIAGNOSIS — D72829 Elevated white blood cell count, unspecified: Secondary | ICD-10-CM | POA: Diagnosis not present

## 2020-01-04 DIAGNOSIS — I5032 Chronic diastolic (congestive) heart failure: Secondary | ICD-10-CM | POA: Diagnosis present

## 2020-01-04 DIAGNOSIS — Z781 Physical restraint status: Secondary | ICD-10-CM

## 2020-01-04 DIAGNOSIS — E875 Hyperkalemia: Secondary | ICD-10-CM | POA: Diagnosis not present

## 2020-01-04 DIAGNOSIS — I10 Essential (primary) hypertension: Secondary | ICD-10-CM | POA: Diagnosis present

## 2020-01-04 DIAGNOSIS — I672 Cerebral atherosclerosis: Secondary | ICD-10-CM | POA: Diagnosis present

## 2020-01-04 DIAGNOSIS — Z8719 Personal history of other diseases of the digestive system: Secondary | ICD-10-CM

## 2020-01-04 DIAGNOSIS — E162 Hypoglycemia, unspecified: Secondary | ICD-10-CM | POA: Diagnosis not present

## 2020-01-04 DIAGNOSIS — R471 Dysarthria and anarthria: Secondary | ICD-10-CM | POA: Diagnosis present

## 2020-01-04 DIAGNOSIS — R7309 Other abnormal glucose: Secondary | ICD-10-CM | POA: Diagnosis not present

## 2020-01-04 DIAGNOSIS — R10819 Abdominal tenderness, unspecified site: Secondary | ICD-10-CM

## 2020-01-04 DIAGNOSIS — J40 Bronchitis, not specified as acute or chronic: Secondary | ICD-10-CM | POA: Diagnosis not present

## 2020-01-04 DIAGNOSIS — E7800 Pure hypercholesterolemia, unspecified: Secondary | ICD-10-CM | POA: Diagnosis not present

## 2020-01-04 DIAGNOSIS — N179 Acute kidney failure, unspecified: Secondary | ICD-10-CM | POA: Diagnosis present

## 2020-01-04 DIAGNOSIS — Z79899 Other long term (current) drug therapy: Secondary | ICD-10-CM

## 2020-01-04 DIAGNOSIS — Z833 Family history of diabetes mellitus: Secondary | ICD-10-CM

## 2020-01-04 DIAGNOSIS — I610 Nontraumatic intracerebral hemorrhage in hemisphere, subcortical: Principal | ICD-10-CM | POA: Diagnosis present

## 2020-01-04 DIAGNOSIS — N1831 Chronic kidney disease, stage 3a: Secondary | ICD-10-CM | POA: Diagnosis not present

## 2020-01-04 DIAGNOSIS — E1165 Type 2 diabetes mellitus with hyperglycemia: Secondary | ICD-10-CM | POA: Diagnosis present

## 2020-01-04 DIAGNOSIS — G479 Sleep disorder, unspecified: Secondary | ICD-10-CM | POA: Diagnosis not present

## 2020-01-04 DIAGNOSIS — Z20822 Contact with and (suspected) exposure to covid-19: Secondary | ICD-10-CM | POA: Diagnosis present

## 2020-01-04 DIAGNOSIS — R29718 NIHSS score 18: Secondary | ICD-10-CM | POA: Diagnosis present

## 2020-01-04 DIAGNOSIS — Z841 Family history of disorders of kidney and ureter: Secondary | ICD-10-CM

## 2020-01-04 DIAGNOSIS — F411 Generalized anxiety disorder: Secondary | ICD-10-CM | POA: Diagnosis not present

## 2020-01-04 DIAGNOSIS — Z888 Allergy status to other drugs, medicaments and biological substances status: Secondary | ICD-10-CM

## 2020-01-04 LAB — LIPID PANEL
Cholesterol: 201 mg/dL — ABNORMAL HIGH (ref 0–200)
HDL: 37 mg/dL — ABNORMAL LOW (ref >40)
LDL Cholesterol: UNDETERMINED mg/dL (ref 0–99)
Total CHOL/HDL Ratio: 5.4 RATIO
Triglycerides: 654 mg/dL — ABNORMAL HIGH (ref <150)
VLDL: UNDETERMINED mg/dL (ref 0–40)

## 2020-01-04 LAB — DIFFERENTIAL
Abs Immature Granulocytes: 0.03 10*3/uL (ref 0.00–0.07)
Basophils Absolute: 0.1 10*3/uL (ref 0.0–0.1)
Basophils Relative: 1 %
Eosinophils Absolute: 0.2 10*3/uL (ref 0.0–0.5)
Eosinophils Relative: 2 %
Immature Granulocytes: 0 %
Lymphocytes Relative: 24 %
Lymphs Abs: 2.4 10*3/uL (ref 0.7–4.0)
Monocytes Absolute: 0.9 10*3/uL (ref 0.1–1.0)
Monocytes Relative: 8 %
Neutro Abs: 6.6 10*3/uL (ref 1.7–7.7)
Neutrophils Relative %: 65 %

## 2020-01-04 LAB — COMPREHENSIVE METABOLIC PANEL
ALT: 25 U/L (ref 0–44)
AST: 31 U/L (ref 15–41)
Albumin: 3.9 g/dL (ref 3.5–5.0)
Alkaline Phosphatase: 57 U/L (ref 38–126)
Anion gap: 13 (ref 5–15)
BUN: 31 mg/dL — ABNORMAL HIGH (ref 8–23)
CO2: 24 mmol/L (ref 22–32)
Calcium: 9.6 mg/dL (ref 8.9–10.3)
Chloride: 103 mmol/L (ref 98–111)
Creatinine, Ser: 1.39 mg/dL — ABNORMAL HIGH (ref 0.44–1.00)
GFR, Estimated: 37 mL/min — ABNORMAL LOW (ref >60)
Glucose, Bld: 139 mg/dL — ABNORMAL HIGH (ref 70–99)
Potassium: 3.9 mmol/L (ref 3.5–5.1)
Sodium: 140 mmol/L (ref 135–145)
Total Bilirubin: 0.5 mg/dL (ref 0.3–1.2)
Total Protein: 7.6 g/dL (ref 6.5–8.1)

## 2020-01-04 LAB — POCT I-STAT 7, (LYTES, BLD GAS, ICA,H+H)
Acid-Base Excess: 4 mmol/L — ABNORMAL HIGH (ref 0.0–2.0)
Bicarbonate: 32.6 mmol/L — ABNORMAL HIGH (ref 20.0–28.0)
Calcium, Ion: 1.21 mmol/L (ref 1.15–1.40)
HCT: 44 % (ref 36.0–46.0)
Hemoglobin: 15 g/dL (ref 12.0–15.0)
O2 Saturation: 100 %
Patient temperature: 98
Potassium: 3 mmol/L — ABNORMAL LOW (ref 3.5–5.1)
Sodium: 142 mmol/L (ref 135–145)
TCO2: 34 mmol/L — ABNORMAL HIGH (ref 22–32)
pCO2 arterial: 61.4 mmHg — ABNORMAL HIGH (ref 32.0–48.0)
pH, Arterial: 7.331 — ABNORMAL LOW (ref 7.350–7.450)
pO2, Arterial: 350 mmHg — ABNORMAL HIGH (ref 83.0–108.0)

## 2020-01-04 LAB — LDL CHOLESTEROL, DIRECT: Direct LDL: 121.8 mg/dL — ABNORMAL HIGH (ref 0–99)

## 2020-01-04 LAB — HEMOGLOBIN A1C
Hgb A1c MFr Bld: 8.6 % — ABNORMAL HIGH (ref 4.8–5.6)
Mean Plasma Glucose: 200.12 mg/dL

## 2020-01-04 LAB — I-STAT CHEM 8, ED
BUN: 34 mg/dL — ABNORMAL HIGH (ref 8–23)
Calcium, Ion: 1.14 mmol/L — ABNORMAL LOW (ref 1.15–1.40)
Chloride: 102 mmol/L (ref 98–111)
Creatinine, Ser: 1.5 mg/dL — ABNORMAL HIGH (ref 0.44–1.00)
Glucose, Bld: 138 mg/dL — ABNORMAL HIGH (ref 70–99)
HCT: 49 % — ABNORMAL HIGH (ref 36.0–46.0)
Hemoglobin: 16.7 g/dL — ABNORMAL HIGH (ref 12.0–15.0)
Potassium: 3.8 mmol/L (ref 3.5–5.1)
Sodium: 143 mmol/L (ref 135–145)
TCO2: 26 mmol/L (ref 22–32)

## 2020-01-04 LAB — GLUCOSE, CAPILLARY
Glucose-Capillary: 109 mg/dL — ABNORMAL HIGH (ref 70–99)
Glucose-Capillary: 138 mg/dL — ABNORMAL HIGH (ref 70–99)

## 2020-01-04 LAB — MRSA PCR SCREENING: MRSA by PCR: NEGATIVE

## 2020-01-04 LAB — CBC
HCT: 48.8 % — ABNORMAL HIGH (ref 36.0–46.0)
Hemoglobin: 15.4 g/dL — ABNORMAL HIGH (ref 12.0–15.0)
MCH: 29.7 pg (ref 26.0–34.0)
MCHC: 31.6 g/dL (ref 30.0–36.0)
MCV: 94 fL (ref 80.0–100.0)
Platelets: ADEQUATE 10*3/uL (ref 150–400)
RBC: 5.19 MIL/uL — ABNORMAL HIGH (ref 3.87–5.11)
RDW: 13.5 % (ref 11.5–15.5)
WBC: 10.1 10*3/uL (ref 4.0–10.5)
nRBC: 0 % (ref 0.0–0.2)

## 2020-01-04 LAB — TRIGLYCERIDES: Triglycerides: 606 mg/dL — ABNORMAL HIGH (ref <150)

## 2020-01-04 LAB — RESPIRATORY PANEL BY RT PCR (FLU A&B, COVID)
Influenza A by PCR: NEGATIVE
Influenza B by PCR: NEGATIVE
SARS Coronavirus 2 by RT PCR: NEGATIVE

## 2020-01-04 LAB — PROTIME-INR
INR: 0.9 (ref 0.8–1.2)
Prothrombin Time: 12.2 seconds (ref 11.4–15.2)

## 2020-01-04 LAB — CBG MONITORING, ED: Glucose-Capillary: 132 mg/dL — ABNORMAL HIGH (ref 70–99)

## 2020-01-04 LAB — APTT: aPTT: 20 seconds — ABNORMAL LOW (ref 24–36)

## 2020-01-04 MED ORDER — ACETAMINOPHEN 325 MG PO TABS: 650.0000 mg | ORAL_TABLET | ORAL | Status: DC | PRN

## 2020-01-04 MED ORDER — ETOMIDATE 2 MG/ML IV SOLN
INTRAVENOUS | Status: AC | PRN
  Administered 2020-01-04: 30 mg via INTRAVENOUS

## 2020-01-04 MED ORDER — SUCCINYLCHOLINE CHLORIDE 20 MG/ML IJ SOLN
INTRAMUSCULAR | Status: AC | PRN
  Administered 2020-01-04: 150 mg via INTRAVENOUS

## 2020-01-04 MED ORDER — SENNOSIDES-DOCUSATE SODIUM 8.6-50 MG PO TABS
1.0000 | ORAL_TABLET | Freq: Two times a day (BID) | ORAL | Status: DC
  Administered 2020-01-05: 1 via ORAL
  Filled 2020-01-04 (×2): qty 1

## 2020-01-04 MED ORDER — ORAL CARE MOUTH RINSE
15.0000 mL | OROMUCOSAL | Status: DC
  Administered 2020-01-04 – 2020-01-06 (×21): 15 mL via OROMUCOSAL

## 2020-01-04 MED ORDER — STROKE: EARLY STAGES OF RECOVERY BOOK
Freq: Once | Status: AC
  Administered 2020-01-04: 1
  Filled 2020-01-04 (×2): qty 1

## 2020-01-04 MED ORDER — LABETALOL HCL 5 MG/ML IV SOLN
10.0000 mg | INTRAVENOUS | Status: DC | PRN
  Administered 2020-01-05 – 2020-01-10 (×16): 10 mg via INTRAVENOUS
  Filled 2020-01-04 (×14): qty 4

## 2020-01-04 MED ORDER — INSULIN ASPART 100 UNIT/ML ~~LOC~~ SOLN
0.0000 [IU] | SUBCUTANEOUS | Status: DC
  Administered 2020-01-04: 3 [IU] via SUBCUTANEOUS
  Administered 2020-01-05 (×3): 11 [IU] via SUBCUTANEOUS
  Administered 2020-01-05: 4 [IU] via SUBCUTANEOUS
  Administered 2020-01-05: 15 [IU] via SUBCUTANEOUS
  Administered 2020-01-05: 4 [IU] via SUBCUTANEOUS
  Administered 2020-01-06: 20 [IU] via SUBCUTANEOUS
  Administered 2020-01-06: 15 [IU] via SUBCUTANEOUS

## 2020-01-04 MED ORDER — CLEVIDIPINE BUTYRATE 0.5 MG/ML IV EMUL
0.0000 mg/h | INTRAVENOUS | Status: DC
  Administered 2020-01-04: 6 mg/h via INTRAVENOUS

## 2020-01-04 MED ORDER — CLEVIDIPINE BUTYRATE 0.5 MG/ML IV EMUL: 0.0000 mg/h | INTRAVENOUS | Status: DC

## 2020-01-04 MED ORDER — SODIUM CHLORIDE 0.9% FLUSH: 3.0000 mL | Freq: Once | INTRAVENOUS | Status: DC

## 2020-01-04 MED ORDER — LABETALOL HCL 5 MG/ML IV SOLN: 5.0000 mg | INTRAVENOUS | Status: DC | PRN

## 2020-01-04 MED ORDER — ACETAMINOPHEN 160 MG/5ML PO SOLN
650.0000 mg | ORAL | Status: DC | PRN
  Administered 2020-01-05 – 2020-01-09 (×6): 650 mg
  Filled 2020-01-04 (×6): qty 20.3

## 2020-01-04 MED ORDER — ATORVASTATIN CALCIUM 80 MG PO TABS
80.0000 mg | ORAL_TABLET | Freq: Every day | ORAL | Status: DC
  Filled 2020-01-04: qty 1

## 2020-01-04 MED ORDER — ALBUTEROL SULFATE (2.5 MG/3ML) 0.083% IN NEBU: 2.5000 mg | INHALATION_SOLUTION | Freq: Four times a day (QID) | RESPIRATORY_TRACT | Status: DC | PRN

## 2020-01-04 MED ORDER — FENTANYL BOLUS VIA INFUSION
25.0000 ug | INTRAVENOUS | Status: DC | PRN
  Filled 2020-01-04: qty 25

## 2020-01-04 MED ORDER — CHLORHEXIDINE GLUCONATE 0.12% ORAL RINSE (MEDLINE KIT)
15.0000 mL | Freq: Two times a day (BID) | OROMUCOSAL | Status: DC
  Administered 2020-01-04 – 2020-01-06 (×4): 15 mL via OROMUCOSAL

## 2020-01-04 MED ORDER — FENTANYL 2500MCG IN NS 250ML (10MCG/ML) PREMIX INFUSION
25.0000 ug/h | INTRAVENOUS | Status: DC
  Administered 2020-01-04: 25 ug/h via INTRAVENOUS
  Filled 2020-01-04: qty 250

## 2020-01-04 MED ORDER — CLEVIDIPINE BUTYRATE 0.5 MG/ML IV EMUL
0.0000 mg/h | INTRAVENOUS | Status: DC
  Administered 2020-01-04: 14 mg/h via INTRAVENOUS
  Administered 2020-01-04 – 2020-01-05 (×2): 12 mg/h via INTRAVENOUS
  Administered 2020-01-05: 14 mg/h via INTRAVENOUS
  Administered 2020-01-05: 11 mg/h via INTRAVENOUS
  Administered 2020-01-05: 16 mg/h via INTRAVENOUS
  Administered 2020-01-05: 4 mg/h via INTRAVENOUS
  Administered 2020-01-06: 6 mg/h via INTRAVENOUS
  Administered 2020-01-06: 14 mg/h via INTRAVENOUS
  Filled 2020-01-04 (×5): qty 50
  Filled 2020-01-04: qty 100
  Filled 2020-01-04 (×4): qty 50
  Filled 2020-01-04: qty 100

## 2020-01-04 MED ORDER — PANTOPRAZOLE SODIUM 40 MG IV SOLR: 40.0000 mg | Freq: Every day | INTRAVENOUS | Status: DC

## 2020-01-04 MED ORDER — SODIUM CHLORIDE 0.9 % IV SOLN: INTRAVENOUS | Status: DC | PRN

## 2020-01-04 MED ORDER — ACETAMINOPHEN 650 MG RE SUPP: 650.0000 mg | RECTAL | Status: DC | PRN

## 2020-01-04 MED ORDER — CLEVIDIPINE BUTYRATE 0.5 MG/ML IV EMUL
INTRAVENOUS | Status: AC
  Filled 2020-01-04: qty 50

## 2020-01-04 MED ORDER — CHLORHEXIDINE GLUCONATE CLOTH 2 % EX PADS
6.0000 | MEDICATED_PAD | Freq: Every day | CUTANEOUS | Status: DC
  Administered 2020-01-04: 6 via TOPICAL

## 2020-01-04 MED ORDER — PROPOFOL 1000 MG/100ML IV EMUL
0.0000 ug/kg/min | INTRAVENOUS | Status: DC
  Administered 2020-01-04: 15 ug/kg/min via INTRAVENOUS
  Administered 2020-01-04 – 2020-01-05 (×2): 20 ug/kg/min via INTRAVENOUS
  Filled 2020-01-04 (×3): qty 100

## 2020-01-04 NOTE — ED Provider Notes (Signed)
[facility] EMERGENCY DEPARTMENT Provider Note   CSN: [id number] Arrival date & time: 01/04/20  1258     History No chief complaint on file.   [name] [name] is a 86 y.o. female.  86 yo F with a chief complaint of acute onset aphasia and right-sided weakness.  This started while she was at church and occurred less than an hour ago.  Patient arrived as a code stroke.  Level 5 caveat acuity of condition.  The history is provided by the patient and the EMS personnel.  Illness Severity:  Severe Onset quality:  Sudden Duration:  1 hour Timing:  Constant Progression:  Unchanged Chronicity:  New Associated symptoms: no chest pain, no congestion, no fever, no headaches, no myalgias, no nausea, no rhinorrhea, no shortness of breath, no vomiting and no wheezing        Past Medical History:  Diagnosis Date  . Colon polyp   . Diabetes mellitus 1992  . Hypertension 1996  . Pancreatitis     Patient Active Problem List   Diagnosis Date Noted  . ICH (intracerebral hemorrhage) (HCC) 01/04/2020  . 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 Left epiretinal membrane 12/10/2019  . Pseudophakia, both eyes 12/10/2019  . DIABETES MELLITUS-TYPE II 06/01/2009  . HYPERLIPIDEMIA 06/01/2009  . HYPERTENSION 06/01/2009  . CHOLELITHIASIS 06/01/2009  . PANCREATITIS 06/01/2009  . PERSONAL HX COLONIC POLYPS 06/01/2009    Past Surgical History:  Procedure Laterality Date  . CHOLECYSTECTOMY    . COLONOSCOPY    . ERCP    . PARTIAL HYSTERECTOMY  86years old  . POLYPECTOMY    . UPPER GASTROINTESTINAL ENDOSCOPY    . WRIST FRACTURE SURGERY  left arm,86years old     OB History   No obstetric history on file.     No family history on file.  Social History   Tobacco Use  . Smoking status: Never Smoker  . Smokeless tobacco: Never Used  Vaping Use  . Vaping Use: Never used  Substance Use Topics  . Alcohol use: Never  . Drug use:  Never    Home Medications Prior to Admission medications   Medication Sig Start Date End Date Taking? Authorizing Provider  acetaminophen (TYLENOL) 500 MG tablet Take 500 mg by mouth every 6 (six) hours as needed for mild pain.    [provider]  albuterol (PROVENTIL HFA;VENTOLIN HFA) 108 (90 BASE) MCG/ACT inhaler Inhale 2 puffs into the lungs every 6 (six) hours as needed for wheezing or shortness of breath.    [provider]  aspirin 81 MG tablet Take 81 mg by mouth daily.      [provider]  CALCIUM CARBONATE-VITAMIN D PO Take 1 tablet by mouth 2 (two) times daily.    [provider]  chlorthalidone (HYGROTON) 50 MG tablet Take 150 mg by mouth daily.     [provider]  cloNIDine (CATAPRES) 0.2 MG tablet Take 0.2-0.3 mg by mouth See admin instructions. 0.2mg  in am, 0.3mg  in pm    [provider]  diclofenac sodium (VOLTAREN) 1 % GEL Apply 4 g topically 4 (four) times daily. Patient not taking: Reported on 06/11/2019 06/17/17   Melene Plan, DO  furosemide (LASIX) 20 MG tablet Take 20 mg by mouth daily.    [provider]  insulin NPH-regular Human (NOVOLIN 70/30) (70-30) 100 UNIT/ML injection Inject 70-80 Units into the skin 3 (three) times daily. 80 unitsin AM 80units at lunch  70  units QHS    [provider]  meclizine (ANTIVERT) 12.5 MG tablet Take 1 tablet (12.5 mg total) by mouth 3 (three) times daily as needed for dizziness. Patient not taking: Reported on 06/11/2019 11/29/16   Pisciotta, Joni Reining, PA-C  metoprolol (TOPROL-XL) 200 MG 24 hr tablet Take 200 mg by mouth daily.    [provider]  ondansetron (ZOFRAN ODT) 4 MG disintegrating tablet Take 1 tablet (4 mg total) by mouth every 8 (eight) hours as needed for nausea or vomiting. 06/11/19   Milagros Loll, MD  Potassium Chloride ER 20 MEQ TBCR Take 1 tablet by mouth daily. 05/18/19   [provider]  pravastatin (PRAVACHOL) 20 MG tablet Take  20 mg by mouth every Monday, Wednesday, and Friday. On Monday, Wednesday, and Friday.    [provider]    Allergies    Amlodipine, Ace inhibitors, and Morphine and related  Review of Systems   Review of Systems  Constitutional: Negative for chills and fever.  HENT: Negative for congestion and rhinorrhea.   Eyes: Negative for redness and visual disturbance.  Respiratory: Negative for shortness of breath and wheezing.   Cardiovascular: Negative for chest pain and palpitations.  Gastrointestinal: Negative for nausea and vomiting.  Genitourinary: Negative for dysuria and urgency.  Musculoskeletal: Negative for arthralgias and myalgias.  Skin: Negative for pallor and wound.  Neurological: Positive for speech difficulty and weakness. Negative for dizziness and headaches.    Physical Exam Updated Vital Signs Wt 105.8 kg   BMI 37.65 kg/m   Physical Exam Vitals and nursing note reviewed.  Constitutional:      General: She is not in acute distress.    Appearance: She is well-developed. She is not diaphoretic.  HENT:     Head: Normocephalic and atraumatic.  Eyes:     Pupils: Pupils are equal, round, and reactive to light.  Cardiovascular:     Rate and Rhythm: Normal rate and regular rhythm.     Heart sounds: No murmur heard.  No friction rub. No gallop.   Pulmonary:     Effort: Pulmonary effort is normal.     Breath sounds: No wheezing or rales.  Abdominal:     General: There is no distension.     Palpations: Abdomen is soft.     Tenderness: There is no abdominal tenderness.  Musculoskeletal:        General: No tenderness.     Cervical back: Normal range of motion and neck supple.  Skin:    General: Skin is warm and dry.  Neurological:     Mental Status: She is alert and oriented to person, place, and time.  Psychiatric:        Behavior: Behavior normal.     ED Results / Procedures / Treatments   Labs (all labs ordered are listed, but only abnormal results  are displayed) Labs Reviewed  APTT - Abnormal; Notable for the following components:      Result Value   aPTT 20 (*)    All other components within normal limits  CBC - Abnormal; Notable for the following components:   RBC 5.19 (*)    Hemoglobin 15.4 (*)    HCT 48.8 (*)    All other components within normal limits  COMPREHENSIVE METABOLIC PANEL - Abnormal; Notable for the following components:   Glucose, Bld 139 (*)    BUN 31 (*)    Creatinine, Ser 1.39 (*)    GFR, Estimated 37 (*)  All other components within normal limits  I-STAT CHEM 8, ED - Abnormal; Notable for the following components:   BUN 34 (*)    Creatinine, Ser 1.50 (*)    Glucose, Bld 138 (*)    Calcium, Ion 1.14 (*)    Hemoglobin 16.7 (*)    HCT 49.0 (*)    All other components within normal limits  CBG MONITORING, ED - Abnormal; Notable for the following components:   Glucose-Capillary 132 (*)    All other components within normal limits  RESPIRATORY PANEL BY RT PCR (FLU A&B, COVID)  PROTIME-INR  DIFFERENTIAL  TRIGLYCERIDES    EKG None  Radiology CT HEAD CODE STROKE WO CONTRAST  Result Date: 01/04/2020 CLINICAL DATA:  Code stroke. Neuro deficit, acute, stroke suspected. EXAM: CT HEAD WITHOUT CONTRAST TECHNIQUE: Contiguous axial images were obtained from the base of the skull through the vertex without intravenous contrast. COMPARISON:  Brain MRI 12/09/2016. FINDINGS: Brain: Mild generalized cerebral atrophy. Acute parenchymal hemorrhage centered within the left thalamus and basal ganglia measuring 1.3 x 2.7 x 1.5 cm (AP x TV x CC). Minimal surrounding edema. Local mass effect with partial effacement of the third ventricle. 2 mm rightward midline shift at the level of the septum pellucidum. Moderate ill-defined hypoattenuation within the cerebral white matter is nonspecific, but compatible chronic small vessel ischemic disease. A known chronic pontine lacunar infarct was better appreciated on the prior MRI of  11/29/2016. Redemonstrated 7 mm lobular calcified focus along the inner table of the right frontal calvarium(series 4, image 20). No demarcated cortical infarct. No extra-axial fluid collection. No midline shift. Vascular: No hyperdense vessel. Atherosclerotic calcifications Skull: Normal. Negative for fracture or focal lesion. Sinuses/Orbits: Visualized orbits show no acute finding. No significant paranasal sinus disease at the imaged levels. These results were called by telephone at the time of interpretation on 01/04/2020 at 1:24 pm to provider Dr. Iver Nestle, who verbally acknowledged these results. IMPRESSION: 1.3 x 2.7 x 1.5 cm acute parenchymal hemorrhage centered within the left thalamus and basal ganglia. Local mass effect with partial effacement of the third ventricle and 2 mm rightward midline shift at the level of the septum pellucidum. Mild cerebral atrophy with moderate chronic small vessel ischemic disease. Known chronic pontine lacunar infarct. 7 mm focus of lobular dural-based calcification versus incidental small calcified meningioma overlying the anterior right frontal lobe. Electronically Signed   By: Jackey Loge DO   On: 01/04/2020 13:26    Procedures Procedure Name: Intubation Date/Time: 01/04/2020 1:57 PM Performed by: Melene Plan, DO Pre-anesthesia Checklist: Patient identified, Patient being monitored, Emergency Drugs available, Timeout performed and Suction available Oxygen Delivery Method: Non-rebreather mask Preoxygenation: Pre-oxygenation with 100% oxygen Induction Type: Rapid sequence Ventilation: Mask ventilation without difficulty Laryngoscope Size: Glidescope and 3 Grade View: Grade I Tube size: 7.5 mm Number of attempts: 1 Airway Equipment and Method: Video-laryngoscopy Placement Confirmation: ETT inserted through vocal cords under direct vision,  CO2 detector and Breath sounds checked- equal and bilateral Secured at: 25 (teeth) cm Tube secured with: ETT holder Dental  Injury: Teeth and Oropharynx as per pre-operative assessment  Difficulty Due To: Difficulty was anticipated Future Recommendations: Recommend- induction with short-acting agent, and alternative techniques readily available      (including critical care time)  Medications Ordered in ED Medications  sodium chloride flush (NS) 0.9 % injection 3 mL (has no administration in time range)  clevidipine (CLEVIPREX) infusion 0.5 mg/mL (6 mg/hr Intravenous New Bag/Given 01/04/20 1334)  labetalol (NORMODYNE) injection 10 mg (  has no administration in time range)  propofol (DIPRIVAN) 1000 MG/100ML infusion (15 mcg/kg/min  105.8 kg Intravenous New Bag/Given 01/04/20 1355)  fentaNYL (SUBLIMAZE) bolus via infusion 25 mcg (has no administration in time range)  fentaNYL 2500mcg in NS 250mL (6710mcg/ml) infusion-PREMIX (has no administration in time range)  etomidate (AMIDATE) injection (30 mg Intravenous Given 01/04/20 1343)  succinylcholine (ANECTINE) injection (150 mg Intravenous Given 01/04/20 1345)    ED Course  I have reviewed the triage vital signs and the nursing notes.  Pertinent labs & imaging results that were available during my care of the patient were reviewed by me and considered in my medical decision making (see chart for details).    MDM Rules/Calculators/A&P                          86 yo F with a chief complaints of difficulty with speech and right-sided weakness.  The patient was made a code stroke in route.  I evaluated the patient on arrival, she was protecting her airway and was cleared to go to CT.  Found to have ICH.  Concern for worsening somnolence.  Intubated.  On Cleviprex. ICU admit.   CRITICAL CARE Performed by: Rae Roamaniel Patrick Xiao Graul   Total critical care time: 35 minutes  Critical care time was exclusive of separately billable procedures and treating other patients.  Critical care was necessary to treat or prevent imminent or life-threatening  deterioration.  Critical care was time spent personally by me on the following activities: development of treatment plan with patient and/or surrogate as well as nursing, discussions with consultants, evaluation of patient's response to treatment, examination of patient, obtaining history from patient or surrogate, ordering and performing treatments and interventions, ordering and review of laboratory studies, ordering and review of radiographic studies, pulse oximetry and re-evaluation of patient's condition.  The patients results and plan were reviewed and discussed.   Any x-rays performed were independently reviewed by myself.   Differential diagnosis were considered with the presenting HPI.  Medications  sodium chloride flush (NS) 0.9 % injection 3 mL (has no administration in time range)  clevidipine (CLEVIPREX) infusion 0.5 mg/mL (6 mg/hr Intravenous New Bag/Given 01/04/20 1334)  labetalol (NORMODYNE) injection 10 mg (has no administration in time range)  propofol (DIPRIVAN) 1000 MG/100ML infusion (15 mcg/kg/min  105.8 kg Intravenous New Bag/Given 01/04/20 1355)  fentaNYL (SUBLIMAZE) bolus via infusion 25 mcg (has no administration in time range)  fentaNYL 2500mcg in NS 250mL (5010mcg/ml) infusion-PREMIX (has no administration in time range)  etomidate (AMIDATE) injection (30 mg Intravenous Given 01/04/20 1343)  succinylcholine (ANECTINE) injection (150 mg Intravenous Given 01/04/20 1345)    Vitals:   01/04/20 1300  Weight: 105.8 kg    Final diagnoses:  Nontraumatic cortical hemorrhage of left cerebral hemisphere [facility]HCC)    Admission/ observation were discussed with the admitting physician, patient and/or family and they are comfortable with the plan.    Final Clinical Impression(s) / ED Diagnoses Final diagnoses:  Nontraumatic cortical hemorrhage of left cerebral hemisphere [facility](HCC)    Rx / DC Orders ED Discharge Orders    None       Melene PlanFloyd, Sharion Grieves, DO 01/04/20 2035

## 2020-01-04 NOTE — Procedures (Signed)
Arterial Catheter Insertion Procedure Note  [name] [name]  [id number]  [birth year]  Date:01/04/20  Time:2:52 PM    Provider Performing: Rayburn Felt    Procedure: Insertion of Arterial Line (62703) without US guidance  Indication(s) Blood pressure monitoring and/or need for frequent ABGs  Consent Unable to obtain consent due to emergent nature of procedure.  Anesthesia None   Time Out Verified patient identification, verified procedure, site/side was marked, verified correct patient position, special equipment/implants available, medications/allergies/relevant history reviewed, required imaging and test results available.   Sterile Technique Maximal sterile technique including full sterile barrier drape, hand hygiene, sterile gown, sterile gloves, mask, hair covering, sterile ultrasound probe cover (if used).   Procedure Description Area of catheter insertion was cleaned with chlorhexidine and draped in sterile fashion. Without real-time ultrasound guidance an arterial catheter was placed into the right radial artery.  Appropriate arterial tracings confirmed on monitor.     Complications/Tolerance None; patient tolerated the procedure well.   EBL Minimal     Specimen(s) None

## 2020-01-04 NOTE — Progress Notes (Signed)
   01/04/20 1400  Clinical Encounter Type  Visited With Family  Visit Type Spiritual support;ED;Trauma  Referral From Chaplain;Nurse  Consult/Referral To Chaplain  Spiritual Encounters  Spiritual Needs Prayer;Emotional  Stress Factors  Patient Stress Factors Health changes  Family Stress Factors Major life changes  Chaplain responded to page. Eleven members of family in consult room. Physician came out and spoke to family and all left but daughter and her husband who went around to front to get an armband for daughter to go up to 4N.

## 2020-01-04 NOTE — Progress Notes (Signed)
Pt transported from ED to CT and then to 4N19 with no complications noted. Hand off report given to [facility] RRT RCP.

## 2020-01-04 NOTE — H&P (Addendum)
Neurology H&P  CC: Aphasia and right sided weakness   History is obtained from: Chart review and family due to   HPI: [name] [name] is a 86 y.o. female with a past medical history significant for diabetes, hypertension, congestive heart failure, colon polyps.  Per family she was in her usual state of health and had driven herself to church when she suddenly collapsed during the service while standing.  EMS was activated.  On arrival to [facility] she was found to have a left basal ganglia hemorrhage.  She was started on Cleviprex and labetalol for blood pressure control and required rapid up titration and multiple doses to achieve control.  Her initial exam was concerning for left leg weakness in addition to right arm and leg weakness as would be expected from the site of her hemorrhage.  Additionally she then was increasingly sleepy and had some emesis.  She also began to have some slight asymmetry in her pupils with a sluggish right pupil as reported by nursing.  Therefore the decision was made to intubate.  Post intubation and repeat scan was found to be stable, and the patient's exam including her pupil reactivity when sedation is held was patent found to be improving  LKW: 12:30 PM tPA given?: No, due to ICH Premorbid modified rankin scale:      0 - No symptoms.  ICH Score: 1  Time performed: 1:00 PM GCS: 13-15 is 0 points -- 0 Infratentorial: No.. If yes, 1 point -- 0 Volume: <30cc is 0 points  Age: 86 y.o.. >80 is 1 point -- 1 point Intraventricular extension is 1 point -- 0  A Score of 1 points has a 30 day mortality of 13%. Stroke. 2001 Apr;32(4):891-7.  ROS: Unable to obtain due to altered mental status.   Past Medical History:  Diagnosis Date  . Colon polyp   . Diabetes mellitus 1992  . Hypertension 1996  . Pancreatitis    Past Surgical History:  Procedure Laterality Date  . CHOLECYSTECTOMY    . COLONOSCOPY    . ERCP    . PARTIAL HYSTERECTOMY  86years old  .  POLYPECTOMY    . UPPER GASTROINTESTINAL ENDOSCOPY    . WRIST FRACTURE SURGERY  left arm,86years old    Current Facility-Administered Medications:  .  clevidipine (CLEVIPREX) infusion 0.5 mg/mL, 0-21 mg/hr, Intravenous, Continuous, Rondale Nies L, MD, Last Rate: 12 mL/hr at 01/04/20 1334, 6 mg/hr at 01/04/20 1334 .  labetalol (NORMODYNE) injection 10 mg, 10 mg, Intravenous, Q10 min PRN, Donneisha Beane L, MD .  propofol (DIPRIVAN) 1000 MG/100ML infusion, 0-50 mcg/kg/min, Intravenous, Continuous, Homero Hyson L, MD, Last Rate: 9.52 mL/hr at 01/04/20 1355, 15 mcg/kg/min at 01/04/20 1355 .  sodium chloride flush (NS) 0.9 % injection 3 mL, 3 mL, Intravenous, Once, Melene Plan, DO  Current Outpatient Medications:  .  acetaminophen (TYLENOL) 500 MG tablet, Take 500 mg by mouth every 6 (six) hours as needed for mild pain., Disp: , Rfl:  .  albuterol (PROVENTIL HFA;VENTOLIN HFA) 108 (90 BASE) MCG/ACT inhaler, Inhale 2 puffs into the lungs every 6 (six) hours as needed for wheezing or shortness of breath., Disp: , Rfl:  .  aspirin 81 MG tablet, Take 81 mg by mouth daily.  , Disp: , Rfl:  .  CALCIUM CARBONATE-VITAMIN D PO, Take 1 tablet by mouth 2 (two) times daily., Disp: , Rfl:  .  chlorthalidone (HYGROTON) 50 MG tablet, Take 150 mg by mouth daily. , Disp: , Rfl:  .  cloNIDine (CATAPRES) 0.2 MG tablet, Take 0.2-0.3 mg by mouth See admin instructions. 0.2mg  in am, 0.3mg  in pm, Disp: , Rfl:  .  diclofenac sodium (VOLTAREN) 1 % GEL, Apply 4 g topically 4 (four) times daily. (Patient not taking: Reported on 06/11/2019), Disp: 100 g, Rfl: 0 .  furosemide (LASIX) 20 MG tablet, Take 20 mg by mouth daily., Disp: , Rfl:  .  insulin NPH-regular Human (NOVOLIN 70/30) (70-30) 100 UNIT/ML injection, Inject 70-80 Units into the skin 3 (three) times daily. 80 unitsin AM 80units at lunch  70 units QHS, Disp: , Rfl:  .  meclizine (ANTIVERT) 12.5 MG tablet, Take 1 tablet (12.5 mg total) by mouth 3 (three) times daily  as needed for dizziness. (Patient not taking: Reported on 06/11/2019), Disp: 30 tablet, Rfl: 0 .  metoprolol (TOPROL-XL) 200 MG 24 hr tablet, Take 200 mg by mouth daily., Disp: , Rfl:  .  ondansetron (ZOFRAN ODT) 4 MG disintegrating tablet, Take 1 tablet (4 mg total) by mouth every 8 (eight) hours as needed for nausea or vomiting., Disp: 20 tablet, Rfl: 0 .  Potassium Chloride ER 20 MEQ TBCR, Take 1 tablet by mouth daily., Disp: , Rfl:  .  pravastatin (PRAVACHOL) 20 MG tablet, Take 20 mg by mouth every Monday, Wednesday, and Friday. On Monday, Wednesday, and Friday., Disp: , Rfl:    No family history on file. Per PCP records, " Patient's family history includes Cancer in her brother; Depression in her brother; Diabetes in her brother; Heart disease in her brother and mother; Hypertension in her sister; Kidney disease in her mother, sister, and sister; No Known Problems in her daughter, daughter, son, and son; Stroke in her brother; Suicidality in her brother. "  Social History:  reports that she has never smoked. She has never used smokeless tobacco. She reports that she does not drink alcohol and does not use drugs.   Exam: Current vital signs: Wt 105.8 kg   BMI 37.65 kg/m  Vital signs in last 24 hours: Weight:  [105.8 kg] 105.8 kg (11/07 1300)   Physical Exam  Constitutional: Appears well-developed and well-nourished.  Psych: Affect appropriate to situation Eyes: No scleral injection HENT: No OP obstruction MSK: no joint deformities.  Cardiovascular: Normal rate and regular rhythm.  Respiratory: Effort normal, non-labored breathing GI: Soft.  No distension. There is no tenderness.  Skin: WDI  Neuro: Mental Status: Patient is sleepy and not oriented to month or year  Patient is UNable to give a clear and coherent history. Mild aphasia, can follow some commands, makes paraphrasic errors, eval somewhat limited by dysarthria  Cranial Nerves: II: Visual Fields are full. Pupils are  equal, round, and reactive to light 3->2 mm III,IV, VI: EOMI without ptosis or diploplia.  V: Facial sensation is symmetric to eyelash brush  VII: Facial movement is notable for a right facial droop  VIII: hearing is intact to voice X: Uvula elevates symmetrically XI: did not assess  XII: tongue is midline without atrophy or fasciculations.  Motor: Tone is low on the right side and in the left leg. Bulk is normal. Neither leg is antigravity but the left is weaker than the right. RUE is 2/5, LUE is at least 4/5 and has no pronator drift Sensory: Equally responsive to noxious stim in all 4 extremities  Deep Tendon Reflexes: 2+ and symmetric in the biceps and patellae.  Plantars: Toes are mute bilaterally.  Cerebellar: Finger to nose intact in the left upper extremity  I have reviewed labs in epic and the results pertinent to this consultation are: Cr 1.39  Lab Results  Component Value Date   CHOL 201 (H) 01/04/2020   HDL 37 (L) 01/04/2020   LDLCALC UNABLE TO CALCULATE IF TRIGLYCERIDE OVER 400 mg/dL 42/59/5638   LDLDIRECT 121.8 (H) 01/04/2020   TRIG 606 (H) 01/04/2020   TRIG 654 (H) 01/04/2020   CHOLHDL 5.4 01/04/2020    Lab Results  Component Value Date   HGBA1C 8.6 (H) 01/04/2020     I have reviewed the images obtained: CT with left basal ganglia / thalamus bleed     Impression: Likely hypertensive ICH, less likely hemorrhage into underlying mass or hemorrhagic conversion of an ischemic stroke.  Notably, her left leg weakness is not explained by imaging findings to date.  Therefore we will get an MRI brain with and without contrast to confirm there is no other subtle lesion too small to be seen on head CT and to evaluate for other etiologies such as malignancy given the patient's age  Recommendations: - Admit to ICU - repeat HCT  - No antiplatelets or anticoagulants - Blood pressure control with goal systolic 120 - 140  Clevipridine gtt  Labetalol 10 mg IV q37min  PRN  Hydralazine 5 mg IV q25min PRN  - Frequent neuro checks - If symptoms worsen or there is decreased mental status, repeat stat head CT - EEG if concern for seizures arise, no need for AED at this time given subcortical location - MRI brain w/ and w/o contrast to eval for potential underlying mass when able, MRA head w/o contrast - Carotid ultrasound - A1c and lipid panel for risk factor optimization  -Given uncontrolled LDL, ordered atorvastatin 80 mg nightly, consider outpatient PSK 9 inhibitor  -Outpatient improve diabetes control - PT,OT,SLP when stabilized   # Intubated for increasing somnolence, emesis, - Appreciate intensivist/ED provider assistance with central line, arterial line and ventilator management  - Neurosurgery consult for potential EVD if patient declines - Family confirmed full code status   # Diabetes -High-dose sliding scale insulin ordered given patient's home insulin regimen appears to be quite high-dose   Brooke Dare MD-PhD [facility] [phone]

## 2020-01-04 NOTE — Consult Note (Signed)
NAME:  [name] [name], MRN:  [id number], DOB:  1932-10-21, LOS: 0 ADMISSION DATE:  01/04/2020, CONSULTATION DATE: 01/04/2020 REFERRING MD:  Gordy Councilman, MD, CHIEF COMPLAINT: Right-sided weakness  Brief History   86 year old female who presented with sudden onset of aphasia and right-sided weakness, noted to have left-sided basal ganglia/thalamic intraparenchymal hemorrhage without intraventricular extension.  She was intubated in the emergency department  History of present illness   86 year old female with diabetes and hypertension was brought in the emergency department with a complaint of sudden onset of right-sided weakness and aphasia, while she was at church.  Stroke code was called, patient was rushed to CT head noncontrast which showed left-sided intraparenchymal basal ganglia/thalamic hemorrhage without intraventricular extension.  Apparently her initial GCS was between 13-15 but then she started getting lethargic, so was intubated for airway protection  Past Medical History  Diabetes mellitus Hypertension CVA  Significant Hospital Events   Admitted to ICU on 11/7  Consults:  Neurology PCCM  Procedures:  ETT 11/7 >>  Significant Diagnostic Tests:  CT head: 1.3 x 2.7 x 1.5 cm acute parenchymal hemorrhage centered within the left thalamus and basal ganglia. Local mass effect with partial effacement of the third ventricle and 2 mm rightward midline shift at the level of the septum pellucidum. Known chronic pontine lacunar infarct.  Micro Data:  11/7 influenza negative 11/7 Covid negative 11/7 MRSA screen >>  Antimicrobials:     Interim history/subjective:  Patient admitted to ICU, intubated  Objective   Blood pressure 138/85, pulse 71, temperature 98 F (36.7 C), temperature source Oral, resp. rate (!) 0, height 5\' 3"  (1.6 m), weight 105.8 kg, SpO2 100 %.    Vent Mode: PRVC FiO2 (%):  [100 %] 100 % Set Rate:  [15 bmp] 15 bmp Vt Set:  [410 mL] 410 mL PEEP:   [5 cmH20] 5 cmH20  No intake or output data in the 24 hours ending 01/04/20 1522 Filed Weights   01/04/20 1300  Weight: 105.8 kg    Examination: General: Elderly Caucasian female, lying on the bed, orally intubated HENT: Atraumatic, normocephalic, dry mucous membranes.  ETT and OGT in place Lungs: Clear to auscultation bilaterally, no wheezes or rhonchi Cardiovascular: Regular rate and rhythm, no murmur Abdomen: Soft, nontender, nondistended bowel sounds present Extremities: 2+ edema in bilateral lower extremities Neuro: Opens eyes with vocal stimuli, following commands on left side and antigravity, plegic right side.  Pupils 3 mm bilateral equal round reactive to light. Skin: No rash  Resolved Hospital Problem list     Assessment & Plan:  Acute left basal ganglia/thalamic intraparenchymal hemorrhage with slight 2 mm midline shift and ICH score of 2 Acute respiratory insufficiency due to hemorrhagic stroke Diabetes type 2 Hypertension  Continue neuro watch every hour Maintain systolic blood pressure < 160 Cleviprex IV infusion as needed Avoid anticoagulation or antiplatelet agents Elevate head to 45 degrees MRI brain Echocardiogram Minimize sedation with propofol and fentanyl with RASS goal of -1/-2 Continue mechanical ventilation and SBT in morning Monitor fingerstick glucose with goal 140-180  Best practice:  Diet: NPO Pain/Anxiety/Delirium protocol (if indicated): Fentanyl/propofol VAP protocol (if indicated): Yes DVT prophylaxis: SCD GI prophylaxis: Protonix Glucose control: SSI Mobility: Bedrest Code Status: Full code Family Communication: Per primary team Disposition: ICU  Labs   CBC: Recent Labs  Lab 01/04/20 1307  WBC 10.1  NEUTROABS 6.6  HGB 15.4*  16.7*  HCT 48.8*  49.0*  MCV 94.0  PLT PLATELET CLUMPS NOTED ON SMEAR,  COUNT APPEARS ADEQUATE    Basic Metabolic Panel: Recent Labs  Lab 01/04/20 1307  NA 140  143  K 3.9  3.8  CL 103  102   CO2 24  GLUCOSE 139*  138*  BUN 31*  34*  CREATININE 1.39*  1.50*  CALCIUM 9.6   GFR: Estimated Creatinine Clearance: 34.5 mL/min (A) (by C-G formula based on SCr of 1.39 mg/dL (H)). Recent Labs  Lab 01/04/20 1307  WBC 10.1    Liver Function Tests: Recent Labs  Lab 01/04/20 1307  AST 31  ALT 25  ALKPHOS 57  BILITOT 0.5  PROT 7.6  ALBUMIN 3.9   No results for input(s): LIPASE, AMYLASE in the last 168 hours. No results for input(s): AMMONIA in the last 168 hours.  ABG    Component Value Date/Time   TCO2 26 01/04/2020 1307     Coagulation Profile: Recent Labs  Lab 01/04/20 1307  INR 0.9    Cardiac Enzymes: No results for input(s): CKTOTAL, CKMB, CKMBINDEX, TROPONINI in the last 168 hours.  HbA1C: No results found for: HGBA1C  CBG: Recent Labs  Lab 01/04/20 1300  GLUCAP 132*    Review of Systems:   Patient is intubated so unable to obtain review of system  Past Medical History  She,  has a past medical history of Colon polyp, Diabetes mellitus (1992), Hypertension (1996), and Pancreatitis.   Surgical History    Past Surgical History:  Procedure Laterality Date  . CHOLECYSTECTOMY    . COLONOSCOPY    . ERCP    . PARTIAL HYSTERECTOMY  85years old  . POLYPECTOMY    . UPPER GASTROINTESTINAL ENDOSCOPY    . WRIST FRACTURE SURGERY  left arm,86years old     Social History   reports that she has never smoked. She has never used smokeless tobacco. She reports that she does not drink alcohol and does not use drugs.   Family History   Her family history is not on file.   Allergies Allergies  Allergen Reactions  . Amlodipine Other (See Comments)    EDEMA  . Ace Inhibitors Cough  . Canagliflozin     INVOKANA  . Morphine And Related Rash and Other (See Comments)    For long periods of time (??)     Home Medications  Prior to Admission medications   Medication Sig Start Date End Date Taking? Authorizing Provider  acetaminophen (TYLENOL)  500 MG tablet Take 500 mg by mouth every 6 (six) hours as needed for mild pain.    [provider]  albuterol (PROVENTIL HFA;VENTOLIN HFA) 108 (90 BASE) MCG/ACT inhaler Inhale 2 puffs into the lungs every 6 (six) hours as needed for wheezing or shortness of breath.    [provider]  aspirin 81 MG tablet Take 81 mg by mouth daily.      [provider]  CALCIUM CARBONATE-VITAMIN D PO Take 1 tablet by mouth 2 (two) times daily.    [provider]  chlorthalidone (HYGROTON) 50 MG tablet Take 150 mg by mouth daily.     [provider]  cloNIDine (CATAPRES) 0.2 MG tablet Take 0.2-0.3 mg by mouth See admin instructions. 0.2mg  in am, 0.3mg  in pm    [provider]  diclofenac sodium (VOLTAREN) 1 % GEL Apply 4 g topically 4 (four) times daily. Patient not taking: Reported on 06/11/2019 06/17/17   Melene Plan, DO  furosemide (LASIX) 20 MG tablet Take 20 mg by mouth daily.    [provider]  insulin NPH-regular Human (NOVOLIN 70/30) (70-30) 100 UNIT/ML injection Inject 70-80 Units into the skin 3 (three) times daily. 80 unitsin AM 80units at lunch  70 units QHS    [provider]  meclizine (ANTIVERT) 12.5 MG tablet Take 1 tablet (12.5 mg total) by mouth 3 (three) times daily as needed for dizziness. Patient not taking: Reported on 06/11/2019 11/29/16   Pisciotta, Joni Reining, PA-C  metoprolol (TOPROL-XL) 200 MG 24 hr tablet Take 200 mg by mouth daily.    [provider]  ondansetron (ZOFRAN ODT) 4 MG disintegrating tablet Take 1 tablet (4 mg total) by mouth every 8 (eight) hours as needed for nausea or vomiting. 06/11/19   Milagros Loll, MD  Potassium Chloride ER 20 MEQ TBCR Take 1 tablet by mouth daily. 05/18/19   [provider]  pravastatin (PRAVACHOL) 20 MG tablet Take 20 mg by mouth every Monday, Wednesday, and Friday. On Monday, Wednesday, and Friday.    [provider]     Total critical care time: 45  minutes  Performed by: Cheri Fowler   Critical care time was exclusive of separately billable procedures and treating other patients.   Critical care was necessary to treat or prevent imminent or life-threatening deterioration.   Critical care was time spent personally by me on the following activities: development of treatment plan with patient and/or surrogate as well as nursing, discussions with consultants, evaluation of patient's response to treatment, examination of patient, obtaining history from patient or surrogate, ordering and performing treatments and interventions, ordering and review of laboratory studies, ordering and review of radiographic studies, pulse oximetry and re-evaluation of patient's condition.   Cheri Fowler MD Critical care physician [facility]  Pager: [phone] Mobile: [phone]

## 2020-01-04 NOTE — ED Notes (Signed)
Soft restraints applied bilateral wrist

## 2020-01-05 ENCOUNTER — Inpatient Hospital Stay (HOSPITAL_COMMUNITY): Payer: Medicare Other

## 2020-01-05 DIAGNOSIS — R739 Hyperglycemia, unspecified: Secondary | ICD-10-CM | POA: Diagnosis not present

## 2020-01-05 DIAGNOSIS — Z9911 Dependence on respirator [ventilator] status: Secondary | ICD-10-CM | POA: Diagnosis not present

## 2020-01-05 DIAGNOSIS — I610 Nontraumatic intracerebral hemorrhage in hemisphere, subcortical: Secondary | ICD-10-CM

## 2020-01-05 DIAGNOSIS — I6389 Other cerebral infarction: Secondary | ICD-10-CM

## 2020-01-05 DIAGNOSIS — J9601 Acute respiratory failure with hypoxia: Secondary | ICD-10-CM

## 2020-01-05 LAB — GLUCOSE, CAPILLARY
Glucose-Capillary: 171 mg/dL — ABNORMAL HIGH (ref 70–99)
Glucose-Capillary: 195 mg/dL — ABNORMAL HIGH (ref 70–99)
Glucose-Capillary: 253 mg/dL — ABNORMAL HIGH (ref 70–99)
Glucose-Capillary: 262 mg/dL — ABNORMAL HIGH (ref 70–99)
Glucose-Capillary: 265 mg/dL — ABNORMAL HIGH (ref 70–99)
Glucose-Capillary: 324 mg/dL — ABNORMAL HIGH (ref 70–99)
Glucose-Capillary: 335 mg/dL — ABNORMAL HIGH (ref 70–99)

## 2020-01-05 LAB — ECHOCARDIOGRAM COMPLETE
Area-P 1/2: 2.99 cm2
Height: 63 in
S' Lateral: 3.34 cm
Weight: 3731.95 oz

## 2020-01-05 LAB — POCT I-STAT 7, (LYTES, BLD GAS, ICA,H+H)
Acid-Base Excess: 1 mmol/L (ref 0.0–2.0)
Acid-Base Excess: 3 mmol/L — ABNORMAL HIGH (ref 0.0–2.0)
Bicarbonate: 30.8 mmol/L — ABNORMAL HIGH (ref 20.0–28.0)
Bicarbonate: 29.3 mmol/L — ABNORMAL HIGH (ref 20.0–28.0)
Calcium, Ion: 1.17 mmol/L (ref 1.15–1.40)
Calcium, Ion: 1.15 mmol/L (ref 1.15–1.40)
HCT: 43 % (ref 36.0–46.0)
HCT: 40 % (ref 36.0–46.0)
Hemoglobin: 14.6 g/dL (ref 12.0–15.0)
Hemoglobin: 13.6 g/dL (ref 12.0–15.0)
O2 Saturation: 98 %
O2 Saturation: 96 %
Patient temperature: 98.3
Patient temperature: 98.6
Potassium: 4.2 mmol/L (ref 3.5–5.1)
Potassium: 3.9 mmol/L (ref 3.5–5.1)
Sodium: 139 mmol/L (ref 135–145)
Sodium: 139 mmol/L (ref 135–145)
TCO2: 33 mmol/L — ABNORMAL HIGH (ref 22–32)
TCO2: 31 mmol/L (ref 22–32)
pCO2 arterial: 70 mmHg (ref 32.0–48.0)
pCO2 arterial: 51.9 mmHg — ABNORMAL HIGH (ref 32.0–48.0)
pH, Arterial: 7.25 — ABNORMAL LOW (ref 7.350–7.450)
pH, Arterial: 7.361 (ref 7.350–7.450)
pO2, Arterial: 129 mmHg — ABNORMAL HIGH (ref 83.0–108.0)
pO2, Arterial: 85 mmHg (ref 83.0–108.0)

## 2020-01-05 MED ORDER — POLYETHYLENE GLYCOL 3350 17 G PO PACK
17.0000 g | PACK | Freq: Every day | ORAL | Status: DC
  Administered 2020-01-06 – 2020-01-14 (×8): 17 g
  Filled 2020-01-05 (×9): qty 1

## 2020-01-05 MED ORDER — VITAL HIGH PROTEIN PO LIQD: 1000.0000 mL | ORAL | Status: DC

## 2020-01-05 MED ORDER — DOCUSATE SODIUM 50 MG/5ML PO LIQD
100.0000 mg | Freq: Two times a day (BID) | ORAL | Status: DC
  Administered 2020-01-05 – 2020-01-15 (×19): 100 mg
  Filled 2020-01-05 (×21): qty 10

## 2020-01-05 MED ORDER — PROSOURCE TF PO LIQD
45.0000 mL | Freq: Three times a day (TID) | ORAL | Status: DC
  Administered 2020-01-05 – 2020-01-12 (×21): 45 mL
  Filled 2020-01-05 (×19): qty 45

## 2020-01-05 MED ORDER — INSULIN GLARGINE 100 UNIT/ML ~~LOC~~ SOLN
30.0000 [IU] | Freq: Every day | SUBCUTANEOUS | Status: DC
  Administered 2020-01-05: 30 [IU] via SUBCUTANEOUS
  Filled 2020-01-05: qty 0.3

## 2020-01-05 MED ORDER — FENTANYL CITRATE (PF) 100 MCG/2ML IJ SOLN
25.0000 ug | INTRAMUSCULAR | Status: DC | PRN
  Administered 2020-01-06: 25 ug via INTRAVENOUS
  Filled 2020-01-05: qty 2

## 2020-01-05 MED ORDER — FENTANYL CITRATE (PF) 100 MCG/2ML IJ SOLN: 25.0000 ug | INTRAMUSCULAR | Status: DC | PRN

## 2020-01-05 MED ORDER — SENNOSIDES-DOCUSATE SODIUM 8.6-50 MG PO TABS
1.0000 | ORAL_TABLET | Freq: Two times a day (BID) | ORAL | Status: DC
  Administered 2020-01-06 – 2020-01-16 (×19): 1
  Filled 2020-01-05 (×20): qty 1

## 2020-01-05 MED ORDER — PERFLUTREN LIPID MICROSPHERE
1.0000 mL | INTRAVENOUS | Status: AC | PRN
  Administered 2020-01-05: 2 mL via INTRAVENOUS
  Filled 2020-01-05: qty 10

## 2020-01-05 MED ORDER — VITAL AF 1.2 CAL PO LIQD
1000.0000 mL | ORAL | Status: DC
  Administered 2020-01-05: 1000 mL

## 2020-01-05 MED ORDER — CHLORHEXIDINE GLUCONATE CLOTH 2 % EX PADS
6.0000 | MEDICATED_PAD | Freq: Every day | CUTANEOUS | Status: DC
  Administered 2020-01-06 – 2020-01-08 (×2): 6 via TOPICAL

## 2020-01-05 MED ORDER — INSULIN ASPART 100 UNIT/ML ~~LOC~~ SOLN
4.0000 [IU] | SUBCUTANEOUS | Status: DC
  Administered 2020-01-05 – 2020-01-06 (×4): 4 [IU] via SUBCUTANEOUS

## 2020-01-05 MED ORDER — PROSOURCE TF PO LIQD: 45.0000 mL | Freq: Two times a day (BID) | ORAL | Status: DC

## 2020-01-05 MED ORDER — DOCUSATE SODIUM 50 MG/5ML PO LIQD: 100.0000 mg | Freq: Two times a day (BID) | ORAL | Status: DC

## 2020-01-05 MED ORDER — PANTOPRAZOLE SODIUM 40 MG PO PACK
40.0000 mg | PACK | Freq: Every day | ORAL | Status: DC
  Administered 2020-01-06 – 2020-01-16 (×11): 40 mg
  Filled 2020-01-05 (×11): qty 20

## 2020-01-05 MED ORDER — GADOBUTROL 1 MMOL/ML IV SOLN
10.0000 mL | Freq: Once | INTRAVENOUS | Status: AC | PRN
  Administered 2020-01-05: 10 mL via INTRAVENOUS

## 2020-01-05 MED ORDER — INSULIN GLARGINE 100 UNIT/ML ~~LOC~~ SOLN
30.0000 [IU] | Freq: Two times a day (BID) | SUBCUTANEOUS | Status: DC
  Administered 2020-01-05: 30 [IU] via SUBCUTANEOUS
  Filled 2020-01-05 (×3): qty 0.3

## 2020-01-05 NOTE — Procedures (Signed)
Cortrak ° °Tube Type:  Cortrak - 43 inches °Tube Location:  Left nare °Initial Placement:  Stomach °Secured by: Bridle °Technique Used to Measure Tube Placement:  Documented cm marking at nare/ corner of mouth °Cortrak Secured At:  69 cm ° ° ° °Cortrak Tube Team Note: ° °Consult received to place a Cortrak feeding tube.  ° °No x-ray is required. RN may begin using tube.  ° °If the tube becomes dislodged please keep the tube and contact the Cortrak team at [email or web address] (password [email or web address]) for replacement.  °If after hours and replacement cannot be delayed, place a NG tube and confirm placement with an abdominal x-ray.  ° ° °Jennfer Gassen MS, RD, LDN °Please refer to AMION for RD and/or RD on-call/weekend/after hours pager ° ° °

## 2020-01-05 NOTE — Progress Notes (Signed)
STROKE TEAM PROGRESS NOTE   INTERVAL HISTORY Her daughter is at the bedside.  I have personally reviewed history of presenting illness with the patient and her daughter, electronic medical records and imaging films in PACS.  MRI scan of the brain this morning shows stable appearance of the left basal ganglia/thalamic bleed with mild cytotoxic edema with no extension into the ventricles or hydrocephalus.  Patient remains intubated for respiratory failure but can be aroused and follows simple commands on the left.  Blood pressure adequately controlled on Cleviprex drip.  Vitals:   01/05/20 0700 01/05/20 0753 01/05/20 0800 01/05/20 1123  BP: (!) 130/48 (!) 145/46 (!) 138/51 (!) 135/44  Pulse: 67 72 71 79  Resp: 20 20 18 20   Temp:   98.6 F (37 C)   TempSrc:   Axillary   SpO2: 99% 95% 96% 96%  Weight:      Height:       CBC:  Recent Labs  Lab 01/04/20 1307 01/04/20 1614 01/05/20 0612 01/05/20 0919  WBC 10.1  --   --   --   NEUTROABS 6.6  --   --   --   HGB 15.4*  16.7*   < > 14.6 13.6  HCT 48.8*  49.0*   < > 43.0 40.0  MCV 94.0  --   --   --   PLT PLATELET CLUMPS NOTED ON SMEAR, COUNT APPEARS ADEQUATE  --   --   --    < > = values in this interval not displayed.   Basic Metabolic Panel:  Recent Labs  Lab 01/04/20 1307 01/04/20 1614 01/05/20 0612 01/05/20 0919  NA 140  143   < > 139 139  K 3.9  3.8   < > 4.2 3.9  CL 103  102  --   --   --   CO2 24  --   --   --   GLUCOSE 139*  138*  --   --   --   BUN 31*  34*  --   --   --   CREATININE 1.39*  1.50*  --   --   --   CALCIUM 9.6  --   --   --    < > = values in this interval not displayed.   Lipid Panel:  Recent Labs  Lab 01/04/20 1601  CHOL 201*  TRIG 654*  606*  HDL 37*  CHOLHDL 5.4  VLDL UNABLE TO CALCULATE IF TRIGLYCERIDE OVER 400 mg/dL  LDLCALC UNABLE TO CALCULATE IF TRIGLYCERIDE OVER 400 mg/dL   13/07/21:  Recent Labs  Lab 01/04/20 1601  HGBA1C 8.6*   Urine Drug Screen: No results for input(s):  LABOPIA, COCAINSCRNUR, LABBENZ, AMPHETMU, THCU, LABBARB in the last 168 hours.  Alcohol Level No results for input(s): ETH in the last 168 hours.  IMAGING past 24 hours DG Chest 1 View  Result Date: 01/04/2020 CLINICAL DATA:  86 year old female status post intubation. EXAM: CHEST  1 VIEW COMPARISON:  Chest x-ray 03/05/2013. FINDINGS: An endotracheal tube is in place with tip 4.2 cm above the carina. A nasogastric tube is seen extending into the stomach, however, the tip of the nasogastric tube extends below the lower margin of the image. Lung volumes are slightly low. There is cephalization of the pulmonary vasculature and slight indistinctness of the interstitial markings suggestive of mild pulmonary edema. No pleural effusions. No pneumothorax. Mild cardiomegaly. The patient is rotated to the right on today's exam, resulting in distortion of  the mediastinal contours and reduced diagnostic sensitivity and specificity for mediastinal pathology. Aortic atherosclerosis. IMPRESSION: 1. Support apparatus, as above. 2. Low lung volumes with evidence of mild congestive heart failure, as above. 3. Aortic atherosclerosis. Electronically Signed   By: Trudie Reed M.D.   On: 01/04/2020 14:33   CT HEAD WO CONTRAST  Result Date: 01/04/2020 CLINICAL DATA:  Hypertensive intracranial hemorrhage. EXAM: CT HEAD WITHOUT CONTRAST TECHNIQUE: Contiguous axial images were obtained from the base of the skull through the vertex without intravenous contrast. COMPARISON:  CT head 01/04/2020, approximately 1 hour prior to the current study. FINDINGS: Brain: Acute hemorrhage left thalamus unchanged in size. This measures approximately 13 x 25 mm on axial images. No intraventricular hemorrhage. Generalized atrophy. Moderate white matter changes consistent with chronic microvascular ischemia. No acute ischemic infarct. Right frontal dural base calcification unchanged likely meningioma measuring approximately 8 mm Vascular: Negative  for hyperdense vessel Skull: Negative Sinuses/Orbits: Mild mucosal edema maxillary sinus bilaterally. Bilateral cataract extraction Other: None IMPRESSION: Left thalamic hemorrhage unchanged from earlier today. No new hemorrhage or hydrocephalus. Moderate chronic microvascular ischemic change in the white matter. Electronically Signed   By: Marlan Palau M.D.   On: 01/04/2020 15:20   MR ANGIO HEAD WO CONTRAST  Result Date: 01/05/2020 CLINICAL DATA:  Stroke follow-up EXAM: MRI HEAD WITHOUT AND WITH CONTRAST MRA HEAD WITHOUT CONTRAST TECHNIQUE: Multiplanar, multiecho pulse sequences of the brain and surrounding structures were obtained without and with intravenous contrast. Angiographic images of the head were obtained using MRA technique without contrast. CONTRAST:  59mL GADAVIST GADOBUTROL 1 MMOL/ML IV SOLN COMPARISON:  Head CT from yesterday FINDINGS: MRI HEAD FINDINGS Brain: Acute hematoma in the left thalamus, T2 isointense and T1 hyperintense. Adjacent diffusion signal attributed to susceptibility artifact. Dimensions are similar to prior head CT, 20 x 10 mm on axial slices. There is a thin rim of adjacent edema. Hemorrhage in this location is often hypertensive and there is confluent chronic small vessel ischemia in the deep cerebral white matter. No underlying masslike enhancement or abnormal regional vessels on postcontrast imaging. No hydrocephalus or extra-axial collection. Nodular structure along the inner table of the right far lateral frontal bone is nonenhancing the attributed to ossification. Vascular: See below. Deep venous structures are symmetrically enhancing Skull and upper cervical spine: Normal marrow signal. Sinuses/Orbits: Unremarkable MRA HEAD FINDINGS Mild atheromatous type irregularity to the carotid and vertebral arteries. No major branch occlusion, beading, or proximal flow limiting stenosis. There is mild to moderate narrowing of the right anterior temporal branch and right proximal  PCA. No flow related signal at the thalamic hematoma. IMPRESSION: 1. No visible mass or vascular lesion underlying the left thalamic hematoma. 2. Extensive chronic small vessel ischemia. 3. Intracranial atherosclerosis. Electronically Signed   By: Marnee Spring M.D.   On: 01/05/2020 06:24   MR BRAIN W WO CONTRAST  Result Date: 01/05/2020 CLINICAL DATA:  Stroke follow-up EXAM: MRI HEAD WITHOUT AND WITH CONTRAST MRA HEAD WITHOUT CONTRAST TECHNIQUE: Multiplanar, multiecho pulse sequences of the brain and surrounding structures were obtained without and with intravenous contrast. Angiographic images of the head were obtained using MRA technique without contrast. CONTRAST:  76mL GADAVIST GADOBUTROL 1 MMOL/ML IV SOLN COMPARISON:  Head CT from yesterday FINDINGS: MRI HEAD FINDINGS Brain: Acute hematoma in the left thalamus, T2 isointense and T1 hyperintense. Adjacent diffusion signal attributed to susceptibility artifact. Dimensions are similar to prior head CT, 20 x 10 mm on axial slices. There is a thin rim of adjacent edema.  Hemorrhage in this location is often hypertensive and there is confluent chronic small vessel ischemia in the deep cerebral white matter. No underlying masslike enhancement or abnormal regional vessels on postcontrast imaging. No hydrocephalus or extra-axial collection. Nodular structure along the inner table of the right far lateral frontal bone is nonenhancing the attributed to ossification. Vascular: See below. Deep venous structures are symmetrically enhancing Skull and upper cervical spine: Normal marrow signal. Sinuses/Orbits: Unremarkable MRA HEAD FINDINGS Mild atheromatous type irregularity to the carotid and vertebral arteries. No major branch occlusion, beading, or proximal flow limiting stenosis. There is mild to moderate narrowing of the right anterior temporal branch and right proximal PCA. No flow related signal at the thalamic hematoma. IMPRESSION: 1. No visible mass or vascular  lesion underlying the left thalamic hematoma. 2. Extensive chronic small vessel ischemia. 3. Intracranial atherosclerosis. Electronically Signed   By: Marnee Spring M.D.   On: 01/05/2020 06:24   DG Chest Port 1 View  Result Date: 01/05/2020 CLINICAL DATA:  Code stroke. Respiratory failure. EXAM: PORTABLE CHEST 1 VIEW COMPARISON:  Earlier film, same date. FINDINGS: The ET tube and NG tubes are stable. Mild central vascular congestion but no edema, infiltrates or effusions. Improved bibasilar lung aeration. IMPRESSION: 1. Stable support apparatus. 2. Improved bibasilar lung aeration. Electronically Signed   By: Rudie Meyer M.D.   On: 01/05/2020 06:29   DG Chest Port 1 View  Result Date: 01/05/2020 CLINICAL DATA:  Respiratory failure. EXAM: PORTABLE CHEST 1 VIEW COMPARISON:  Chest x-ray 01/04/2020 FINDINGS: The endotracheal tube and NG tubes are in good position, unchanged. The cardiac silhouette, mediastinal and hilar contours are stable. Small left pleural effusion and left basilar atelectasis. Stable mild vascular congestion without overt pulmonary edema. IMPRESSION: 1. Stable support apparatus. 2. Small left pleural effusion and left basilar atelectasis. 3. Stable mild vascular congestion. Electronically Signed   By: Rudie Meyer M.D.   On: 01/05/2020 05:34   CT HEAD CODE STROKE WO CONTRAST  Result Date: 01/04/2020 CLINICAL DATA:  Code stroke. Neuro deficit, acute, stroke suspected. EXAM: CT HEAD WITHOUT CONTRAST TECHNIQUE: Contiguous axial images were obtained from the base of the skull through the vertex without intravenous contrast. COMPARISON:  Brain MRI 12/09/2016. FINDINGS: Brain: Mild generalized cerebral atrophy. Acute parenchymal hemorrhage centered within the left thalamus and basal ganglia measuring 1.3 x 2.7 x 1.5 cm (AP x TV x CC). Minimal surrounding edema. Local mass effect with partial effacement of the third ventricle. 2 mm rightward midline shift at the level of the septum  pellucidum. Moderate ill-defined hypoattenuation within the cerebral white matter is nonspecific, but compatible chronic small vessel ischemic disease. A known chronic pontine lacunar infarct was better appreciated on the prior MRI of 11/29/2016. Redemonstrated 7 mm lobular calcified focus along the inner table of the right frontal calvarium(series 4, image 20). No demarcated cortical infarct. No extra-axial fluid collection. No midline shift. Vascular: No hyperdense vessel. Atherosclerotic calcifications Skull: Normal. Negative for fracture or focal lesion. Sinuses/Orbits: Visualized orbits show no acute finding. No significant paranasal sinus disease at the imaged levels. These results were called by telephone at the time of interpretation on 01/04/2020 at 1:24 pm to provider Dr. Iver Nestle, who verbally acknowledged these results. IMPRESSION: 1.3 x 2.7 x 1.5 cm acute parenchymal hemorrhage centered within the left thalamus and basal ganglia. Local mass effect with partial effacement of the third ventricle and 2 mm rightward midline shift at the level of the septum pellucidum. Mild cerebral atrophy with moderate chronic small  vessel ischemic disease. Known chronic pontine lacunar infarct. 7 mm focus of lobular dural-based calcification versus incidental small calcified meningioma overlying the anterior right frontal lobe. Electronically Signed   By: Jackey LogeKyle  Golden DO   On: 01/04/2020 13:26    PHYSICAL EXAM Patient is intubated on ventilator.  Not on sedation. . Afebrile. Head is nontraumatic. Neck is supple without bruit.    Cardiac exam no murmur or gallop. Lungs are clear to auscultation. Distal pulses are well felt. Neurological Exam : Patient is drowsy but can be aroused with some stimulation.  She follows simple commands on the left side and midline but not on the right.  Pupils of 3 to 4 mm reactive.  Left gaze preference but can look partially to the right.  Blinks to threat on the left but not on the right.   Tongue midline right lower facial weakness.  Dense right hemiplegia with only trace withdrawal greater in the right lower and upper extremity.  Purposeful antigravity movements on the left side. ASSESSMENT/PLAN [name] [name] is a 86 y.o. female with history of diabetes, hypertension, congestive heart failure, colon polyps presenting with aphasia and R sided weakness which progressed to lethargy and emesis w/ assymetric pupils.   Stroke:   L basal ganglia hemorrhage secondary to hypertension  Code Stroke CT head L thalamic and basal ganglia IPH w/ local mass effect and partial effacement 3rd ventricle w/ 2mm midline shift. Small vessel disease. Atrophy. Old pontine lacune. Anterior R frontal lobe meningioma vs calcification.   CT head after intubation unchanged  MRI  No underlying basal ganglia lesion. Extensive small vessel disease.   MRA  Intracranial atherosclerosis.   Carotid Doppler  pending   2D Echo pending   LDL UTC as TG > 400 mg/dL, direct LDL [phone]   [id number] 8.6  VTE prophylaxis - SCDs   aspirin 81 mg daily prior to admission, now on No antithrombotic given hemorrhage   Therapy recommendations:  pending   Disposition:  pending   Acute hypoxic and hypercapnic respiratory failure due to hemorrhagic stroke  Secondary to stroke  Intubated in the ED d/t lethargy  CCM on board   Hypertension  Home meds:  catapress 0.2 am + 0.3 pm; lasix 20 (w/ K), metoprolol 200 XL  BP as high as 171/157 on admission  . On Cleviprex . SBP goal < 140 . BP now stable . Long-term BP goal normotensive  Hyperlipidemia  Home meds:  pravachol 20  Now on lipitor 80  Will hold statin in setting of acute hemorrhage   LDL UTC d/t TG > 400  mg/dL, Direct LDL [phone]   Resume statin at discharge  Diabetes type II Uncontrolled  Home meds:  70/30 80u am + lunch, 70u hs  HgbA1c 8.6, goal < 7.0  CBGs Recent Labs    01/05/20 0448 01/05/20 0738 01/05/20 1126  GLUCAP 171*  253* 262*      SSI  Dysphagia . Secondary to stroke . NPO . Adding TF  . For Cortrak  . Speech on board   Other Stroke Risk Factors  Advanced Age >/= 7665   Morbid Obesity, Body mass index is 41.32 kg/m., BMI >/= 30 associated with increased stroke risk, recommend weight loss, diet and exercise as appropriate   Family hx stroke (brother)  Other Active Problems  CKD stage 3b Cre 1.39  D/c foley and art line tomorrow  Hospital day # 1 She has presented with a large left thalamic/basal ganglia hemorrhage  and remains at risk for hematoma expansion and needs aggressive blood pressure control and close neurological monitoring in the ICU for the next few days.  I had a long discussion the patient's daughter at the bedside and answered questions about her prognosis and plan of care.  She wants full support and continue aggressive measures.  Plan repeat CT head tomorrow.  Insert core track tube for feeding and medications.  Maintain systolic blood pressure below 497 for first 24 hours and then below 160.  Keep I = O, normothermic and normoglycemic.  No need for hypertonic saline at the present time unless exam and CT scan appearance changes.  Discussed with Dr. Merrily Pew critical care medicine This patient is critically ill and at significant risk of neurological worsening, death and care requires constant monitoring of vital signs, hemodynamics,respiratory and cardiac monitoring, extensive review of multiple databases, frequent neurological assessment, discussion with family, other specialists and medical decision making of high complexity.I have made any additions or clarifications directly to the above note.This critical care time does not reflect procedure time, or teaching time or supervisory time of PA/NP/Med Resident etc but could involve care discussion time.  I spent 40 minutes of neurocritical care time  in the care of  this patient.   Delia Heady, MD   To contact Stroke Continuity  provider, please refer to [email or web address]. After hours, contact General Neurology

## 2020-01-05 NOTE — Evaluation (Signed)
Physical Therapy Evaluation Patient Details Name: [name] [name] MRN: [id number] DOB: [birth year] Today's Date: 01/05/2020   History of Present Illness  The pt is an 86 yo female presenting with acute onset aphasia and R-sided weakness. Imaging revealed L basal ganglia/thalamic hemorrhage with 54mm midline shift and no intraventricular extension. The pt was intubated 11/7. PMH includes: DM II, HTN, CHF, and colon polyps.  Clinical Impression  Pt in bed upon arrival of PT, agreeable to evaluation at this time. Prior to admission the pt was independent with mobility, but no family was present to get further details about the pt's past mobility and independence with ADLs. The pt now presents with limitations in functional mobility, strength, stability, coordination, and endurance due to above dx, and will continue to benefit from skilled PT to address these deficits. The pt was able to tolerate transition to chair position using bed egress and then transition to sitting EOB for >10 min. The pt does require significant assist at this time to complete the transitions as well as to maintain static seated position due to poor use of UE coordination or core strength. The pt was able to complete some AROM using her L extremities at this time, and will benefit from skilled PT to continue to progress functional strength and progress OOB transfers. The pt will need continued therapy following d/c in order to facilitate return to prior level of mobility and independence.      Follow Up Recommendations CIR    Equipment Recommendations  Other (comment) (defer to post acute)    Recommendations for Other Services Rehab consult     Precautions / Restrictions Precautions Precautions: Fall Precaution Comments: R-sided hemiparesis. Vent on 40% FiO2, 5 PEEP, RR of 20 Restrictions Weight Bearing Restrictions: No      Mobility  Bed Mobility Overal bed mobility: Needs Assistance Bed Mobility: Supine to Sit;Sit  to Supine     Supine to sit: Max assist;+2 for physical assistance;+2 for safety/equipment;HOB elevated Sit to supine: Max assist;+2 for physical assistance;+2 for safety/equipment   General bed mobility comments: pt attempting to assist, but requiring maxA to come to sitting EOB, maxA to BLE and maxA at trunk from elevated [facility]    Transfers                 General transfer comment: deferred at this time  Modified Rankin (Stroke Patients Only) Modified Rankin (Stroke Patients Only) Pre-Morbid Rankin Score: No symptoms Modified Rankin: Severe disability     Balance Overall balance assessment: Needs assistance Sitting-balance support: Single extremity supported;Feet supported Sitting balance-Leahy Scale: Poor Sitting balance - Comments: reliant on UE support and/or physical assist. Flucuated during session minA to maxA Postural control: Posterior lean                                   Pertinent Vitals/Pain Pain Assessment: Faces Faces Pain Scale: Hurts a little bit Pain Location: generalized grimacing Pain Descriptors / Indicators: Discomfort;Grimacing Pain Intervention(s): Monitored during session;Limited activity within patient's tolerance    Home Living Family/patient expects to be discharged to:: Private residence                 Additional Comments: Pt unable to provide home information at this time, no family present. Will continue to assess.    Prior Function Level of Independence: Independent         Comments: per RN, family has told her  the pt was "in good health"     Hand Dominance   Dominant Hand: Right    Extremity/Trunk Assessment   Upper Extremity Assessment Upper Extremity Assessment: Defer to OT evaluation    Lower Extremity Assessment Lower Extremity Assessment: Generalized weakness;RLE deficits/detail;LLE deficits/detail RLE Deficits / Details: limited command following, no moving against gravity at this time LLE  Deficits / Details: able to move against gravity for SAQ, but unable to follow commands for moving legs OOB    Cervical / Trunk Assessment Cervical / Trunk Assessment: Kyphotic  Communication   Communication: Expressive difficulties (intubated)  Cognition Arousal/Alertness: Awake/alert Behavior During Therapy: WFL for tasks assessed/performed Overall Cognitive Status: Difficult to assess Area of Impairment: Following commands                       Following Commands: Follows one step commands inconsistently       General Comments: following single step commands intermittently with L extremities, limited with R-extremities. Pt able to answer yes/no questions, but questionable accuracy      General Comments General comments (skin integrity, edema, etc.): Vent settings: 40% FiO2, PEEP of 5, RR of 20.     Exercises General Exercises - Lower Extremity Short Arc QuadBarbaraann Boys;Both;5 reps;Seated Heel Slides: AAROM;Both;5 reps;Supine Hip ABduction/ADduction: AAROM;Both;5 reps;Supine   Assessment/Plan    PT Assessment Patient needs continued PT services  PT Problem List Decreased strength;Decreased range of motion;Decreased activity tolerance;Decreased balance;Decreased coordination;Decreased mobility;Decreased knowledge of use of DME;Decreased knowledge of precautions       PT Treatment Interventions DME instruction;Gait training;Stair training;Functional mobility training;Therapeutic activities;Therapeutic exercise;Balance training;Neuromuscular re-education;Patient/family education    PT Goals (Current goals can be found in the Care Plan section)  Acute Rehab PT Goals Patient Stated Goal: pt unable to state, no famly present PT Goal Formulation: With patient Time For Goal Achievement: 01/19/20 Potential to Achieve Goals: Fair    Frequency Min 4X/week   Barriers to discharge        Co-evaluation PT/OT/SLP Co-Evaluation/Treatment: Yes Reason for Co-Treatment:  Complexity of the patient's impairments (multi-system involvement);To address functional/ADL transfers;For patient/therapist safety;Necessary to address cognition/behavior during functional activity PT goals addressed during session: Mobility/safety with mobility;Balance;Strengthening/ROM         AM-PAC PT "6 Clicks" Mobility  Outcome Measure Help needed turning from your back to your side while in a flat bed without using bedrails?: A Lot Help needed moving from lying on your back to sitting on the side of a flat bed without using bedrails?: Total Help needed moving to and from a bed to a chair (including a wheelchair)?: Total Help needed standing up from a chair using your arms (e.g., wheelchair or bedside chair)?: Total Help needed to walk in hospital room?: Total Help needed climbing 3-5 steps with a railing? : Total 6 Click Score: 7    End of Session Equipment Utilized During Treatment: Oxygen Activity Tolerance: Patient tolerated treatment well Patient left: in bed;with call bell/phone within reach;with restraints reapplied Nurse Communication: Mobility status PT Visit Diagnosis: Other abnormalities of gait and mobility (R26.89);Muscle weakness (generalized) (M62.81)    Time: 1660-6301 PT Time Calculation (min) (ACUTE ONLY): 30 min   Charges:   PT Evaluation $PT Eval Moderate Complexity: 1 Mod          Rolm Baptise, PT, DPT   Acute Rehabilitation Department Pager #: [phone]  Gaetana Michaelis 01/05/2020, 5:53 PM

## 2020-01-05 NOTE — Progress Notes (Signed)
Pt transported from 4N19 to MRI and back w/out complication. Pt respiratory status was stable throughout transport w/no distress noted. RT will continue to monitor.

## 2020-01-05 NOTE — Progress Notes (Signed)
Inpatient Diabetes Program Recommendations  AACE/ADA: New Consensus Statement on Inpatient Glycemic Control (2015)  Target Ranges:  Prepandial:   less than 140 mg/dL      Peak postprandial:   less than 180 mg/dL (1-2 hours)      Critically ill patients:  140 - 180 mg/dL   Lab Results  Component Value Date   GLUCAP 262 (H) 01/05/2020   HGBA1C 8.6 (H) 01/04/2020    Review of Glycemic Control Results for [name] [name], [name] [name] (MRN [id number]) as of 01/05/2020 13:57  Ref. Range 01/05/2020 00:19 01/05/2020 04:48 01/05/2020 07:38 01/05/2020 11:26  Glucose-Capillary Latest Ref Range: 70 - 99 mg/dL 458 (H) 099 (H) 833 (H) 262 (H)   Diabetes history: Type 2 DM Outpatient Diabetes medications: Novolog 70/30 100B/ 60-80 L/ 70 D Current orders for Inpatient glycemic control: Novolog 4 units Q4H, Lantus 30 units QD, Novolog 0-20 units Q4H  Inpatient Diabetes Program Recommendations:    May want to consider increasing basal insulin to Lantus 20 units BID. Following.  Thanks, Lujean Rave, MSN, RNC-OB Diabetes Coordinator [phone] (8a-5p)

## 2020-01-05 NOTE — Progress Notes (Signed)
Carotid artery duplex has been completed. Preliminary results can be found in CV Proc through chart review.   01/05/20 1:47 PM Olen Cordial RVT

## 2020-01-05 NOTE — Progress Notes (Signed)
eLink Physician-Brief Progress Note Patient Name: [name] [name] DOB: 17-Dec-1932 MRN: [id number]   Date of Service  01/05/2020  HPI/Events of Note  Ventilatory dysfunction of unclear etiology.  eICU Interventions  Stat portable CXR, ABG, ETT advanced slightly to exclude location at or above the vocal cords, ventilator switched, PCCM ground crew requested to evaluate the patient .        Thomasene Lot Anaika Santillano 01/05/2020, 6:14 AM

## 2020-01-05 NOTE — Progress Notes (Addendum)
eLink Physician-Brief Progress Note Patient Name: [name] [name] DOB: 12/29/1932 MRN: [id number]   Date of Service  01/05/2020  HPI/Events of Note  Notified of glucose > 300  eICU Interventions  Lantus 30 BID ordered by dayteam. No dose given yet. Discussed with bedside RN to give due dose now and will continue to monitor the need to start Endotool     Intervention Category Major Interventions: Hyperglycemia - active titration of insulin therapy  Darl Pikes 01/05/2020, 9:12 PM

## 2020-01-05 NOTE — Progress Notes (Signed)
  Echocardiogram 2D Echocardiogram has been performed.  [name] [name] [name] [name] 01/05/2020, 2:19 PM

## 2020-01-05 NOTE — Progress Notes (Signed)
Initial Nutrition Assessment  DOCUMENTATION CODES:   Obesity unspecified  INTERVENTION:   Vital AF 1.2 start @ 20 ml/hr and increase go to goal of 45 ml/hr via OG tube  45 ml ProSource TF TID  Provides: 1416 kcal, 114 grams protein, and 875 ml free water.   Propofol and Cleviprex weaning as able.    NUTRITION DIAGNOSIS:   Inadequate oral intake related to inability to eat as evidenced by NPO status.  GOAL:   Provide needs based on ASPEN/SCCM guidelines  MONITOR:   TF tolerance, Labs  REASON FOR ASSESSMENT:   Consult, Ventilator Enteral/tube feeding initiation and management  ASSESSMENT:   Pt with PMH of DM and HTN admitted with L basal ganglia/thalamic intraparenchymal hemorrhage with 2 mm midline shift. Pt intubated for airway protection.   Patient is currently intubated on ventilator support Temp (24hrs), Avg:98 F (36.7 C), Min:97.5 F (36.4 C), Max:98.6 F (37 C)  Propofol: off Cleviprex @ 16 ml/hr provides: 768 kcal  Medications reviewed and include: colace, SSI, miralax, senokot-s  Labs reviewed: K+ 3 HgbA1C: 8.6 CBG's: 171-253   16 F OG tube per xray extends into the stomach, tip of tube not visualized   Diet Order:   Diet Order            Diet NPO time specified  Diet effective now                 EDUCATION NEEDS:   Not appropriate for education at this time  Skin:  Skin Assessment: Reviewed RN Assessment (MASD - abdomen)  Last BM:  unknown  Height:   Ht Readings from Last 1 Encounters:  01/04/20 5\' 3"  (1.6 m)    Weight:   Wt Readings from Last 1 Encounters:  01/04/20 105.8 kg    Ideal Body Weight:  52.2 kg  BMI:  Body mass index is 41.32 kg/m.  Estimated Nutritional Needs:   Kcal:  1400  Protein:  >105 grams  Fluid:  >1.5 L/day  13/07/21., RD, LDN, CNSC See AMiON for contact information

## 2020-01-05 NOTE — Evaluation (Signed)
Occupational Therapy Evaluation Patient Details Name: [name] [name] MRN: [id number] DOB: 1932-08-02 Today's Date: 01/05/2020    History of Present Illness The pt is an 86 yo female presenting with acute onset aphasia and R-sided weakness. Imaging revealed L basal ganglia/thalamic hemorrhage with 64mm midline shift and no intraventricular extension. The pt was intubated 11/7. PMH includes: DM II, HTN, CHF, and colon polyps.   Clinical Impression   This 86 y/o female presents with the above. PTA pt independent with ADL and mobility (per chart and RN report from family). Pt easily aroused and agreeable to participating in therapy session, presenting with the above and below listed deficits. Pt currently requiring two person assist for safe completion of bed mobility and up to maxA for static balance EOB given R lateral/posterior lean. Pt following simple one step commands mostly with LUE/LLE during session but with no active movement noted in RUE. She currently requires totalA for ADL. Pt to benefit from continued acute OT services; anticipate steady progress, currently recommend CIR level therapies at time of discharge to maximize her safety and independence with ADL and mobility.     Follow Up Recommendations  CIR    Equipment Recommendations  Other (comment) (TBD)    Recommendations for Other Services Rehab consult     Precautions / Restrictions Precautions Precautions: Fall Precaution Comments: R-sided hemiparesis. Vent on 40% FiO2, 5 PEEP, RR of 20 Restrictions Weight Bearing Restrictions: No      Mobility Bed Mobility Overal bed mobility: Needs Assistance Bed Mobility: Supine to Sit;Sit to Supine     Supine to sit: Max assist;+2 for physical assistance;+2 for safety/equipment;HOB elevated Sit to supine: Max assist;+2 for physical assistance;+2 for safety/equipment   General bed mobility comments: pt attempting to assist, but requiring maxA to come to sitting EOB, maxA to  BLE and maxA at trunk from elevated [facility]    Transfers                 General transfer comment: deferred at this time    Balance Overall balance assessment: Needs assistance Sitting-balance support: Single extremity supported;Feet supported Sitting balance-Leahy Scale: Poor Sitting balance - Comments: reliant on UE support and/or physical assist. Flucuated during session minA to maxA Postural control: Posterior lean                                 ADL either performed or assessed with clinical judgement   ADL Overall ADL's : Needs assistance/impaired                                       General ADL Comments: pt currently totalA     Vision   Additional Comments: will continue to assess      Perception     Praxis      Pertinent Vitals/Pain Pain Assessment: Faces Faces Pain Scale: Hurts a little bit Pain Location: generalized grimacing Pain Descriptors / Indicators: Discomfort;Grimacing Pain Intervention(s): Monitored during session;Limited activity within patient's tolerance;Repositioned     Hand Dominance Right   Extremity/Trunk Assessment Upper Extremity Assessment Upper Extremity Assessment: RUE deficits/detail;Difficult to assess due to impaired cognition;Generalized weakness RUE Deficits / Details: grossly 0-1/5 throughout, suspect decreased sensation, RUE edemous  RUE Sensation: decreased light touch RUE Coordination: decreased fine motor;decreased gross motor   Lower Extremity Assessment Lower Extremity Assessment: Defer to PT  evaluation RLE Deficits / Details: limited command following, no moving against gravity at this time LLE Deficits / Details: able to move against gravity for SAQ, but unable to follow commands for moving legs OOB   Cervical / Trunk Assessment Cervical / Trunk Assessment: Kyphotic   Communication Communication Communication: Expressive difficulties (intubated)   Cognition Arousal/Alertness:  Awake/alert Behavior During Therapy: WFL for tasks assessed/performed Overall Cognitive Status: Difficult to assess Area of Impairment: Following commands                       Following Commands: Follows one step commands inconsistently       General Comments: following single step commands intermittently with L extremities, limited with R-extremities. Pt able to answer yes/no questions, but questionable accuracy (often only nodding with "yes")    General Comments  Vent settings: 40% FiO2, PEEP of 5, RR of 20.     Exercises Exercises: General Lower Extremity General Exercises - Lower Extremity Short Arc QuadBarbaraann [name];Both;5 reps;Seated Heel Slides: AAROM;Both;5 reps;Supine Hip ABduction/ADduction: AAROM;Both;5 reps;Supine   Shoulder Instructions      Home Living Family/patient expects to be discharged to:: Private residence                                 Additional Comments: Pt unable to provide home information at this time, no family present. Will continue to assess.      Prior Functioning/Environment Level of Independence: Independent        Comments: per RN, family has told her the pt was "in good health"        OT Problem List: Decreased strength;Impaired balance (sitting and/or standing);Decreased range of motion;Decreased activity tolerance;Decreased cognition;Decreased safety awareness;Impaired UE functional use;Decreased knowledge of precautions;Decreased knowledge of use of DME or AE;Increased edema      OT Treatment/Interventions: Self-care/ADL training;Therapeutic exercise;Energy conservation;DME and/or AE instruction;Therapeutic activities;Cognitive remediation/compensation;Patient/family education;Balance training;Visual/perceptual remediation/compensation;Neuromuscular education    OT Goals(Current goals can be found in the care plan section) Acute Rehab OT Goals Patient Stated Goal: pt unable to state, no famly present OT Goal  Formulation: Patient unable to participate in goal setting Time For Goal Achievement: 01/19/20 Potential to Achieve Goals: Good  OT Frequency: Min 2X/week   Barriers to D/C:            Co-evaluation PT/OT/SLP Co-Evaluation/Treatment: Yes Reason for Co-Treatment: Complexity of the patient's impairments (multi-system involvement);Necessary to address cognition/behavior during functional activity;For patient/therapist safety;To address functional/ADL transfers PT goals addressed during session: Mobility/safety with mobility;Balance;Strengthening/ROM OT goals addressed during session: Strengthening/ROM      AM-PAC OT "6 Clicks" Daily Activity     Outcome Measure Help from another person eating meals?: Total Help from another person taking care of personal grooming?: Total Help from another person toileting, which includes using toliet, bedpan, or urinal?: Total Help from another person bathing (including washing, rinsing, drying)?: Total Help from another person to put on and taking off regular upper body clothing?: Total Help from another person to put on and taking off regular lower body clothing?: Total 6 Click Score: 6   End of Session Equipment Utilized During Treatment: Oxygen Nurse Communication: Mobility status  Activity Tolerance: Patient tolerated treatment well Patient left: in bed;with call bell/phone within reach;with restraints reapplied  OT Visit Diagnosis: Muscle weakness (generalized) (M62.81);Other abnormalities of gait and mobility (R26.89);Other symptoms and signs involving cognitive function;Hemiplegia and hemiparesis Hemiplegia - Right/Left: Right  Time: 3716-9678 OT Time Calculation (min): 30 min Charges:  OT General Charges $OT Visit: 1 Visit OT Evaluation $OT Eval High Complexity: 1 High  Marcy Siren, OT Acute Rehabilitation Services Pager [phone] Office [phone]   Orlando Penner 01/05/2020, 6:11 PM

## 2020-01-05 NOTE — Consult Note (Signed)
NAME:  [name] [name], MRN:  [id number], DOB:  1932/12/13, LOS: 1 ADMISSION DATE:  01/04/2020, CONSULTATION DATE: 01/04/2020 REFERRING MD:  Gordy Councilman, MD, CHIEF COMPLAINT: Right-sided weakness  Brief History   86 year old female who presented with sudden onset of aphasia and right-sided weakness, noted to have left-sided basal ganglia/thalamic intraparenchymal hemorrhage without intraventricular extension.  She was intubated in the emergency department  History of present illness   86 year old female with diabetes and hypertension was brought in the emergency department with a complaint of sudden onset of right-sided weakness and aphasia, while she was at church.  Stroke code was called, patient was rushed to CT head noncontrast which showed left-sided intraparenchymal basal ganglia/thalamic hemorrhage without intraventricular extension.  Apparently her initial GCS was between 13-15 but then she started getting lethargic, so was intubated for airway protection  Past Medical History  Diabetes mellitus Hypertension CVA  Significant Hospital Events   Admitted to ICU on 11/7  Consults:  Neurology PCCM  Procedures:  ETT 11/7 >>  Significant Diagnostic Tests:  CT head: 1.3 x 2.7 x 1.5 cm acute parenchymal hemorrhage centered within the left thalamus and basal ganglia. Local mass effect with partial effacement of the third ventricle and 2 mm rightward midline shift at the level of the septum pellucidum. Known chronic pontine lacunar infarct.  MRI brain 11/8> no underlying mass or vascular lesion, chronic microvascular lesions  Micro Data:  11/7 influenza negative 11/7 Covid negative 11/7 MRSA screen >>  Antimicrobials:    Interim history/subjective:  Apnea episodes overnight on the vent, vent had to be changed. Hypercapnia on ABG at that time.  Objective   Blood pressure (!) 125/49, pulse 67, temperature 98.3 F (36.8 C), temperature source Axillary, resp. rate (!) 7,  height 5\' 3"  (1.6 m), weight 105.8 kg, SpO2 100 %.    Vent Mode: PRVC FiO2 (%):  [40 %-100 %] 40 % Set Rate:  [15 bmp] 15 bmp Vt Set:  [410 mL] 410 mL PEEP:  [5 cmH20] 5 cmH20 Plateau Pressure:  [14 cmH20-21 cmH20] 15 cmH20   Intake/Output Summary (Last 24 hours) at 01/05/2020 0730 Last data filed at 01/05/2020 0600 Gross per 24 hour  Intake 696.64 ml  Output 650 ml  Net 46.64 ml   Filed Weights   01/04/20 1300  Weight: 105.8 kg    Examination: General: critically ill appearing elderly woman laying in bed in NAD, appears stated age HENT: [place]/AT, eyes anicteric, oral mucosa moist. ETT in place. Lungs: CTAB, breathing synchronously with the vent, minimal bloody secretions  Cardiovascular: RRR, no murmurs Abdomen: soft, NT, ND Extremities: mild LE edema, no clubbing or cyanosis. Neuro: opens eyes to verbal stimulation, RASS -2. Tracks, but will not look to her right. Moving left toes on command briefly, not LUE. No withdrawal from pain on the right. Skin: no rashes or wounds.  CXR personally reviewed> ETT in appropriate position, no opacities  Resolved Hospital Problem list     Assessment & Plan:  Acute left basal ganglia/thalamic intraparenchymal hemorrhage with slight 2 mm midline shift and ICH score of 2-- stable size on follow up MRI. No underlying mass or vascular lesion to explain her bleed. -con't to hold chemical DVT prophylaxis; avoid AC and AP meds -hourly neuro checks -minimize sedation; will perform SBT when mental status no longer precludes extubation -maintain SBP <160- cleviprex PRN, labetalol PRN -neuroprotective measures- elevated HOB -echo ordered  Acute hypoxic and hypercapnic respiratory failure due to hemorrhagic stroke -con't LVV, 4-8cc/kg  IBW with goal Pplat <30 and DP <15 -daily SAT & SBT when appropraite. Mental status currently precludes extubation -titrate down FiO2 as appropriate -VAP prevention protocol -repeat ABG this morning. Avoid  hypocapnia. -starting TF today  Diabetes type 2 Hypertension -adding long acting insulin -SSI PRN -adding TF coverage -goal BG 140-180    Best practice:  Diet: NPO Pain/Anxiety/Delirium protocol (if indicated): Fentanyl/propofol VAP protocol (if indicated): Yes DVT prophylaxis: SCD GI prophylaxis: Protonix Glucose control: SSI Mobility: Bedrest Code Status: Full code Family Communication: daughter updated at bedside Disposition: ICU  Labs   CBC: Recent Labs  Lab 01/04/20 1307 01/04/20 1614 01/05/20 0612  WBC 10.1  --   --   NEUTROABS 6.6  --   --   HGB 15.4*  16.7* 15.0 14.6  HCT 48.8*  49.0* 44.0 43.0  MCV 94.0  --   --   PLT PLATELET CLUMPS NOTED ON SMEAR, COUNT APPEARS ADEQUATE  --   --     Basic Metabolic Panel: Recent Labs  Lab 01/04/20 1307 01/04/20 1614 01/05/20 0612  NA 140  143 142 139  K 3.9  3.8 3.0* 4.2  CL 103  102  --   --   CO2 24  --   --   GLUCOSE 139*  138*  --   --   BUN 31*  34*  --   --   CREATININE 1.39*  1.50*  --   --   CALCIUM 9.6  --   --    GFR: Estimated Creatinine Clearance: 34.5 mL/min (A) (by C-G formula based on SCr of 1.39 mg/dL (H)). Recent Labs  Lab 01/04/20 1307  WBC 10.1    Liver Function Tests: Recent Labs  Lab 01/04/20 1307  AST 31  ALT 25  ALKPHOS 57  BILITOT 0.5  PROT 7.6  ALBUMIN 3.9   No results for input(s): LIPASE, AMYLASE in the last 168 hours. No results for input(s): AMMONIA in the last 168 hours.  ABG    Component Value Date/Time   PHART 7.250 (L) 01/05/2020 0612   PCO2ART 70.0 (HH) 01/05/2020 0612   PO2ART 129 (H) 01/05/2020 0612   HCO3 30.8 (H) 01/05/2020 0612   TCO2 33 (H) 01/05/2020 0612   O2SAT 98.0 01/05/2020 0612     Coagulation Profile: Recent Labs  Lab 01/04/20 1307  INR 0.9    Cardiac Enzymes: No results for input(s): CKTOTAL, CKMB, CKMBINDEX, TROPONINI in the last 168 hours.  HbA1C: Hgb A1c MFr Bld  Date/Time Value Ref Range Status  01/04/2020 04:01  PM 8.6 (H) 4.8 - 5.6 % Final    Comment:    (NOTE) Pre diabetes:          5.7%-6.4%  Diabetes:              >6.4%  Glycemic control for   <7.0% adults with diabetes     CBG: Recent Labs  Lab 01/04/20 1300 01/04/20 1556 01/04/20 2022 01/05/20 0019 01/05/20 0448  GLUCAP 132* 109* 138* 195* 171*     This patient is critically ill with multiple organ system failure which requires frequent high complexity decision making, assessment, support, evaluation, and titration of therapies. This was completed through the application of advanced monitoring technologies and extensive interpretation of multiple databases. During this encounter critical care time was devoted to patient care services described in this note for 40 minutes.  Steffanie Dunn, DO 01/05/20 7:31 AM [facility]

## 2020-01-05 NOTE — Progress Notes (Signed)
RT obtained ABG on pt with the following results. MD Everardo All notified of critical PCO2. RT will continue to monitor.   Results for [name] [name], [name] [name] (MRN [id number]) as of 01/05/2020 06:46  Ref. Range 01/05/2020 06:12  Sample type Unknown ARTERIAL  pH, Arterial Latest Ref Range: 7.35 - 7.45  7.250 (L)  pCO2 arterial Latest Ref Range: 32 - 48 mmHg 70.0 (HH)  pO2, Arterial Latest Ref Range: 83 - 108 mmHg 129 (H)  TCO2 Latest Ref Range: 22 - 32 mmol/L 33 (H)  Acid-Base Excess Latest Ref Range: 0.0 - 2.0 mmol/L 1.0  Bicarbonate Latest Ref Range: 20.0 - 28.0 mmol/L 30.8 (H)  O2 Saturation Latest Units: % 98.0  Patient temperature Unknown 98.3 F  Collection site Unknown art line

## 2020-01-05 NOTE — Progress Notes (Signed)
SLP Cancellation Note  Patient Details Name: BLUE RUGGERIO MRN: [id number] DOB: 1932/10/27   Cancelled treatment:       Reason Eval/Treat Not Completed: Medical issues which prohibited therapy (pt on vent this am). Will f/u as able.   Mahala Menghini., M.A. CCC-SLP Acute Rehabilitation Services Pager [phone] Office [phone]  01/05/2020, 7:45 AM

## 2020-01-06 DIAGNOSIS — I610 Nontraumatic intracerebral hemorrhage in hemisphere, subcortical: Secondary | ICD-10-CM | POA: Diagnosis not present

## 2020-01-06 LAB — GLUCOSE, CAPILLARY
Glucose-Capillary: 419 mg/dL — ABNORMAL HIGH (ref 70–99)
Glucose-Capillary: 347 mg/dL — ABNORMAL HIGH (ref 70–99)
Glucose-Capillary: 333 mg/dL — ABNORMAL HIGH (ref 70–99)
Glucose-Capillary: 245 mg/dL — ABNORMAL HIGH (ref 70–99)
Glucose-Capillary: 306 mg/dL — ABNORMAL HIGH (ref 70–99)
Glucose-Capillary: 285 mg/dL — ABNORMAL HIGH (ref 70–99)
Glucose-Capillary: 275 mg/dL — ABNORMAL HIGH (ref 70–99)
Glucose-Capillary: 273 mg/dL — ABNORMAL HIGH (ref 70–99)
Glucose-Capillary: 272 mg/dL — ABNORMAL HIGH (ref 70–99)
Glucose-Capillary: 238 mg/dL — ABNORMAL HIGH (ref 70–99)
Glucose-Capillary: 257 mg/dL — ABNORMAL HIGH (ref 70–99)
Glucose-Capillary: 206 mg/dL — ABNORMAL HIGH (ref 70–99)

## 2020-01-06 LAB — PHOSPHORUS: Phosphorus: 3.7 mg/dL (ref 2.5–4.6)

## 2020-01-06 LAB — TRIGLYCERIDES: Triglycerides: 398 mg/dL — ABNORMAL HIGH (ref <150)

## 2020-01-06 LAB — BLOOD GAS, ARTERIAL
Acid-Base Excess: 5.5 mmol/L — ABNORMAL HIGH (ref 0.0–2.0)
Bicarbonate: 29.9 mmol/L — ABNORMAL HIGH (ref 20.0–28.0)
FIO2: 36
O2 Saturation: 96.2 %
Patient temperature: 37
pCO2 arterial: 47.6 mmHg (ref 32.0–48.0)
pH, Arterial: 7.414 (ref 7.350–7.450)
pO2, Arterial: 83.2 mmHg (ref 83.0–108.0)

## 2020-01-06 LAB — CBC
HCT: 41.6 % (ref 36.0–46.0)
Hemoglobin: 13.5 g/dL (ref 12.0–15.0)
MCH: 30 pg (ref 26.0–34.0)
MCHC: 32.5 g/dL (ref 30.0–36.0)
MCV: 92.4 fL (ref 80.0–100.0)
Platelets: 209 10*3/uL (ref 150–400)
RBC: 4.5 MIL/uL (ref 3.87–5.11)
RDW: 13.8 % (ref 11.5–15.5)
WBC: 15.3 10*3/uL — ABNORMAL HIGH (ref 4.0–10.5)
nRBC: 0 % (ref 0.0–0.2)

## 2020-01-06 LAB — BASIC METABOLIC PANEL
Anion gap: 11 (ref 5–15)
BUN: 43 mg/dL — ABNORMAL HIGH (ref 8–23)
CO2: 27 mmol/L (ref 22–32)
Calcium: 8.7 mg/dL — ABNORMAL LOW (ref 8.9–10.3)
Chloride: 100 mmol/L (ref 98–111)
Creatinine, Ser: 1.57 mg/dL — ABNORMAL HIGH (ref 0.44–1.00)
GFR, Estimated: 32 mL/min — ABNORMAL LOW (ref >60)
Glucose, Bld: 407 mg/dL — ABNORMAL HIGH (ref 70–99)
Potassium: 3.9 mmol/L (ref 3.5–5.1)
Sodium: 138 mmol/L (ref 135–145)

## 2020-01-06 LAB — MAGNESIUM: Magnesium: 1.7 mg/dL (ref 1.7–2.4)

## 2020-01-06 MED ORDER — ORAL CARE MOUTH RINSE
15.0000 mL | Freq: Two times a day (BID) | OROMUCOSAL | Status: DC
  Administered 2020-01-07 – 2020-01-16 (×18): 15 mL via OROMUCOSAL

## 2020-01-06 MED ORDER — DEXTROSE IN LACTATED RINGERS 5 % IV SOLN: INTRAVENOUS | Status: DC

## 2020-01-06 MED ORDER — INSULIN GLARGINE 100 UNIT/ML ~~LOC~~ SOLN
30.0000 [IU] | Freq: Two times a day (BID) | SUBCUTANEOUS | Status: DC
  Filled 2020-01-06 (×2): qty 0.3

## 2020-01-06 MED ORDER — FUROSEMIDE 10 MG/ML IJ SOLN
20.0000 mg | Freq: Once | INTRAMUSCULAR | Status: AC
  Administered 2020-01-06: 20 mg via INTRAVENOUS
  Filled 2020-01-06: qty 2

## 2020-01-06 MED ORDER — INSULIN ASPART 100 UNIT/ML ~~LOC~~ SOLN
0.0000 [IU] | SUBCUTANEOUS | Status: DC
  Administered 2020-01-06: 7 [IU] via SUBCUTANEOUS
  Administered 2020-01-06 (×2): 11 [IU] via SUBCUTANEOUS
  Administered 2020-01-07 (×6): 7 [IU] via SUBCUTANEOUS
  Administered 2020-01-08 (×2): 4 [IU] via SUBCUTANEOUS
  Administered 2020-01-08: 7 [IU] via SUBCUTANEOUS
  Administered 2020-01-08 (×2): 4 [IU] via SUBCUTANEOUS
  Administered 2020-01-08 – 2020-01-09 (×2): 7 [IU] via SUBCUTANEOUS
  Administered 2020-01-09 (×3): 4 [IU] via SUBCUTANEOUS
  Administered 2020-01-09: 7 [IU] via SUBCUTANEOUS
  Administered 2020-01-09: 3 [IU] via SUBCUTANEOUS
  Administered 2020-01-09: 4 [IU] via SUBCUTANEOUS
  Administered 2020-01-10: 3 [IU] via SUBCUTANEOUS
  Administered 2020-01-10 – 2020-01-11 (×2): 4 [IU] via SUBCUTANEOUS
  Administered 2020-01-11 (×2): 3 [IU] via SUBCUTANEOUS
  Administered 2020-01-11 – 2020-01-12 (×5): 4 [IU] via SUBCUTANEOUS
  Administered 2020-01-12 – 2020-01-13 (×2): 3 [IU] via SUBCUTANEOUS
  Administered 2020-01-13 – 2020-01-14 (×7): 4 [IU] via SUBCUTANEOUS
  Administered 2020-01-15: 3 [IU] via SUBCUTANEOUS
  Administered 2020-01-15 (×2): 4 [IU] via SUBCUTANEOUS
  Administered 2020-01-15: 7 [IU] via SUBCUTANEOUS
  Administered 2020-01-15: 3 [IU] via SUBCUTANEOUS
  Administered 2020-01-15 – 2020-01-16 (×2): 4 [IU] via SUBCUTANEOUS
  Administered 2020-01-16: 3 [IU] via SUBCUTANEOUS

## 2020-01-06 MED ORDER — INSULIN ASPART 100 UNIT/ML ~~LOC~~ SOLN
6.0000 [IU] | SUBCUTANEOUS | Status: DC
  Administered 2020-01-06 – 2020-01-08 (×12): 6 [IU] via SUBCUTANEOUS

## 2020-01-06 MED ORDER — LACTATED RINGERS IV BOLUS
1000.0000 mL | Freq: Once | INTRAVENOUS | Status: AC
  Administered 2020-01-06: 1000 mL via INTRAVENOUS

## 2020-01-06 MED ORDER — INSULIN GLARGINE 100 UNIT/ML ~~LOC~~ SOLN: 30.0000 [IU] | Freq: Two times a day (BID) | SUBCUTANEOUS | Status: DC

## 2020-01-06 MED ORDER — INSULIN ASPART 100 UNIT/ML ~~LOC~~ SOLN: 6.0000 [IU] | SUBCUTANEOUS | Status: DC

## 2020-01-06 MED ORDER — CHLORHEXIDINE GLUCONATE 0.12 % MT SOLN
15.0000 mL | Freq: Two times a day (BID) | OROMUCOSAL | Status: DC
  Administered 2020-01-06 – 2020-01-16 (×20): 15 mL via OROMUCOSAL
  Filled 2020-01-06 (×17): qty 15

## 2020-01-06 MED ORDER — INSULIN REGULAR(HUMAN) IN NACL 100-0.9 UT/100ML-% IV SOLN
INTRAVENOUS | Status: DC
  Administered 2020-01-06: 6 [IU]/h via INTRAVENOUS
  Filled 2020-01-06: qty 100

## 2020-01-06 MED ORDER — HYDROCHLOROTHIAZIDE 25 MG PO TABS
25.0000 mg | ORAL_TABLET | Freq: Every day | ORAL | Status: DC
  Administered 2020-01-06 – 2020-01-16 (×11): 25 mg
  Filled 2020-01-06 (×11): qty 1

## 2020-01-06 MED ORDER — LACTATED RINGERS IV SOLN: INTRAVENOUS | Status: DC

## 2020-01-06 MED ORDER — INSULIN GLARGINE 100 UNIT/ML ~~LOC~~ SOLN
45.0000 [IU] | Freq: Two times a day (BID) | SUBCUTANEOUS | Status: DC
  Administered 2020-01-06 (×2): 45 [IU] via SUBCUTANEOUS
  Filled 2020-01-06 (×4): qty 0.45

## 2020-01-06 MED ORDER — DEXTROSE 50 % IV SOLN: 0.0000 mL | INTRAVENOUS | Status: DC | PRN

## 2020-01-06 MED ORDER — CLONIDINE HCL 0.2 MG PO TABS
0.2000 mg | ORAL_TABLET | Freq: Two times a day (BID) | ORAL | Status: DC
  Administered 2020-01-06: 0.2 mg
  Filled 2020-01-06: qty 1

## 2020-01-06 NOTE — Progress Notes (Signed)
STROKE TEAM PROGRESS NOTE   INTERVAL HISTORY Patient is doing better today.  She is more alert and interactive and moving left side purposefully but remains hemiplegic on the right but remains intubated but is weaning well on her ventilator for respiratory failure and plans are to extubate her later this morning.  Blood pressure adequately controlled.  Vital signs stable.  Vitals:   01/06/20 0755 01/06/20 0800 01/06/20 0900 01/06/20 1000  BP: (!) 125/50 (!) 116/96 (!) 128/54 (!) 137/44  Pulse: (!) 110 (!) 109 (!) 107 (!) 109  Resp: 16 16 16 17   Temp:      TempSrc:      SpO2: 98% 100% 98% 97%  Weight:      Height:       CBC:  Recent Labs  Lab 01/04/20 1307 01/04/20 1614 01/05/20 0919 01/06/20 0512  WBC 10.1  --   --  15.3*  NEUTROABS 6.6  --   --   --   HGB 15.4*  16.7*   < > 13.6 13.5  HCT 48.8*  49.0*   < > 40.0 41.6  MCV 94.0  --   --  92.4  PLT PLATELET CLUMPS NOTED ON SMEAR, COUNT APPEARS ADEQUATE  --   --  209   < > = values in this interval not displayed.   Basic Metabolic Panel:  Recent Labs  Lab 01/04/20 1307 01/04/20 1614 01/05/20 0919 01/06/20 0512  NA 140  143   < > 139 138  K 3.9  3.8   < > 3.9 3.9  CL 103  102  --   --  100  CO2 24  --   --  27  GLUCOSE 139*  138*  --   --  407*  BUN 31*  34*  --   --  43*  CREATININE 1.39*  1.50*  --   --  1.57*  CALCIUM 9.6  --   --  8.7*  MG  --   --   --  1.7  PHOS  --   --   --  3.7   < > = values in this interval not displayed.   Lipid Panel:  Recent Labs  Lab 01/04/20 1601 01/04/20 1601 01/06/20 0512  CHOL 201*  --   --   TRIG 654*  606*   < > 398*  HDL 37*  --   --   CHOLHDL 5.4  --   --   VLDL UNABLE TO CALCULATE IF TRIGLYCERIDE OVER 400 mg/dL  --   --   LDLCALC UNABLE TO CALCULATE IF TRIGLYCERIDE OVER 400 mg/dL  --   --    < > = values in this interval not displayed.   HgbA1c:  Recent Labs  Lab 01/04/20 1601  HGBA1C 8.6*   Urine Drug Screen: No results for input(s): LABOPIA,  COCAINSCRNUR, LABBENZ, AMPHETMU, THCU, LABBARB in the last 168 hours.  Alcohol Level No results for input(s): ETH in the last 168 hours.  IMAGING past 24 hours ECHOCARDIOGRAM COMPLETE  Result Date: 01/05/2020    ECHOCARDIOGRAM REPORT   Patient Name:   [name] [name] Date of Exam: 01/05/2020 Medical Rec #:  [id number]        Height:       63.0 in Accession #:    [id number]       Weight:       233.2 lb Date of Birth:  1932/04/18        BSA:  2.064 m Patient Age:    86 years         BP:           152/54 mmHg Patient Gender: F                HR:           86 bpm. Exam Location:  Inpatient Procedure: 2D Echo, Cardiac Doppler, Color Doppler and Intracardiac            Opacification Agent Indications:    Stroke 434.91 / I163.9  History:        Patient has no prior history of Echocardiogram examinations.                 Risk Factors:Hypertension and Diabetes.  Sonographer:    Elmarie Shileyiffany Dance Referring Phys: [id number] SHARON L BIBY  Sonographer Comments: Echo performed with patient supine and on artificial respirator. IMPRESSIONS  1. Left ventricular ejection fraction, by estimation, is 55 to 60%. The left ventricle has normal function. The left ventricle has no regional wall motion abnormalities. Left ventricular diastolic parameters are consistent with Grade II diastolic dysfunction (pseudonormalization). Elevated left ventricular end-diastolic pressure.  2. Right ventricular systolic function is normal. The right ventricular size is normal.  3. Left atrial size was mildly dilated.  4. The mitral valve is degenerative. Trivial mitral valve regurgitation. No evidence of mitral stenosis. Moderate mitral annular calcification.  5. The aortic valve is tricuspid. Aortic valve regurgitation is not visualized. Mild aortic valve sclerosis is present, with no evidence of aortic valve stenosis.  6. The inferior vena cava is normal in size with greater than 50% respiratory variability, suggesting right atrial pressure of 3  mmHg. FINDINGS  Left Ventricle: Left ventricular ejection fraction, by estimation, is 55 to 60%. The left ventricle has normal function. The left ventricle has no regional wall motion abnormalities. Definity contrast agent was given IV to delineate the left ventricular  endocardial borders. The left ventricular internal cavity size was normal in size. There is no left ventricular hypertrophy. Left ventricular diastolic parameters are consistent with Grade II diastolic dysfunction (pseudonormalization). Elevated left ventricular end-diastolic pressure. Right Ventricle: The right ventricular size is normal. No increase in right ventricular wall thickness. Right ventricular systolic function is normal. Left Atrium: Left atrial size was mildly dilated. Right Atrium: Right atrial size was normal in size. Pericardium: There is no evidence of pericardial effusion. Mitral Valve: The mitral valve is degenerative in appearance. There is mild thickening of the mitral valve leaflet(s). There is mild calcification of the mitral valve leaflet(s). Moderate mitral annular calcification. Trivial mitral valve regurgitation. No evidence of mitral valve stenosis. Tricuspid Valve: The tricuspid valve is normal in structure. Tricuspid valve regurgitation is not demonstrated. No evidence of tricuspid stenosis. Aortic Valve: The aortic valve is tricuspid. Aortic valve regurgitation is not visualized. Mild aortic valve sclerosis is present, with no evidence of aortic valve stenosis. Pulmonic Valve: The pulmonic valve was normal in structure. Pulmonic valve regurgitation is not visualized. No evidence of pulmonic stenosis. Aorta: The aortic root is normal in size and structure. Venous: The inferior vena cava is normal in size with greater than 50% respiratory variability, suggesting right atrial pressure of 3 mmHg. IAS/Shunts: No atrial level shunt detected by color flow Doppler.  LEFT VENTRICLE PLAX 2D LVIDd:         4.43 cm  Diastology  LVIDs:         3.34 cm  LV e' medial:  3.70 cm/s LV PW:         1.28 cm  LV E/e' medial:  35.7 LV IVS:        0.96 cm  LV e' lateral:   4.68 cm/s LVOT diam:     2.10 cm  LV E/e' lateral: 28.2 LV SV:         100 LV SV Index:   48 LVOT Area:     3.46 cm  RIGHT VENTRICLE             IVC RV Basal diam:  2.75 cm     IVC diam: 1.96 cm RV S prime:     11.00 cm/s TAPSE (M-mode): 1.4 cm LEFT ATRIUM              Index       RIGHT ATRIUM           Index LA diam:        3.90 cm  1.89 cm/m  RA Area:     14.70 cm LA Vol (A2C):   92.8 ml  44.96 ml/m RA Volume:   35.80 ml  17.34 ml/m LA Vol (A4C):   106.0 ml 51.35 ml/m LA Biplane Vol: 101.0 ml 48.93 ml/m  AORTIC VALVE LVOT Vmax:   117.00 cm/s LVOT Vmean:  89.500 cm/s LVOT VTI:    0.288 m  AORTA Ao Root diam: 3.00 cm Ao Asc diam:  3.20 cm MITRAL VALVE MV Area (PHT): 2.99 cm     SHUNTS MV Decel Time: 254 msec     Systemic VTI:  0.29 m MV E velocity: 132.00 cm/s  Systemic Diam: 2.10 cm MV A velocity: 147.00 cm/s MV E/A ratio:  0.90 Charlton Haws MD Electronically signed by Charlton Haws MD Signature Date/Time: 01/05/2020/2:50:39 PM    Final    VAS US CAROTID  Result Date: 01/05/2020 Carotid Arterial Duplex Study Indications:       CVA. Risk Factors:      Hypertension, hyperlipidemia, Diabetes. Limitations        Today's exam was limited due to the body habitus of the                    patient, the patient's respiratory variation, patient on a                    ventilator and patient positioning. Comparison Study:  No prior studies. Performing Technologist: Chanda Busing RVT  Examination Guidelines: A complete evaluation includes B-mode imaging, spectral Doppler, color Doppler, and power Doppler as needed of all accessible portions of each vessel. Bilateral testing is considered an integral part of a complete examination. Limited examinations for reoccurring indications may be performed as noted.  Right Carotid Findings:  +----------+--------+--------+--------+-----------------------+--------+           PSV cm/sEDV cm/sStenosisPlaque Description     Comments +----------+--------+--------+--------+-----------------------+--------+ CCA Prox  142     8               smooth and heterogenous         +----------+--------+--------+--------+-----------------------+--------+ CCA Distal146     15              smooth and heterogenous         +----------+--------+--------+--------+-----------------------+--------+ ICA Prox  127     11              calcific                        +----------+--------+--------+--------+-----------------------+--------+  ICA Distal77      11                                     tortuous +----------+--------+--------+--------+-----------------------+--------+ ECA       211     0                                               +----------+--------+--------+--------+-----------------------+--------+ +----------+--------+-------+--------+-------------------+           PSV cm/sEDV cmsDescribeArm Pressure (mmHG) +----------+--------+-------+--------+-------------------+ [id number]                                        +----------+--------+-------+--------+-------------------+ +---------+--------+---+--------+--+----------------------------+ VertebralPSV cm/s140EDV cm/s14Antegrade and High resistant +---------+--------+---+--------+--+----------------------------+  Left Carotid Findings: +----------+--------+--------+--------+-----------------------+--------+           PSV cm/sEDV cm/sStenosisPlaque Description     Comments +----------+--------+--------+--------+-----------------------+--------+ CCA Prox  137     0               smooth and heterogenous         +----------+--------+--------+--------+-----------------------+--------+ CCA Distal136     0                                                +----------+--------+--------+--------+-----------------------+--------+ ICA Prox  123     12              smooth and heterogenous         +----------+--------+--------+--------+-----------------------+--------+ ICA Distal113     15                                     tortuous +----------+--------+--------+--------+-----------------------+--------+ ECA       166     0                                               +----------+--------+--------+--------+-----------------------+--------+ +----------+--------+--------+--------+-------------------+           PSV cm/sEDV cm/sDescribeArm Pressure (mmHG) +----------+--------+--------+--------+-------------------+ [id number]                                         +----------+--------+--------+--------+-------------------+ +---------+--------+--+--------+-+----------------------------+ VertebralPSV cm/s85EDV cm/s9Antegrade and High resistant +---------+--------+--+--------+-+----------------------------+   Summary: Right Carotid: Velocities in the right ICA are consistent with a 1-39% stenosis. Left Carotid: Velocities in the left ICA are consistent with a 1-39% stenosis. Vertebrals: Bilateral vertebral arteries demonstrate high resistant flow. *See table(s) above for measurements and observations.  Electronically signed by Delia Heady MD on 01/05/2020 at 1:55:03 PM.    Final     PHYSICAL EXAM  Patient is intubated on ventilator.  Not on sedation. . Afebrile. Head is nontraumatic. Neck is supple without bruit. Cardiac exam no murmur or gallop. Lungs are clear to auscultation. Distal pulses are well felt. Neurological Exam : Patient is  awake and interactive.  She follows simple commands on the left side and midline but not on the right.  Pupils are 3 to 4 mm reactive.  Left gaze preference but can look partially to the right.  Blinks to threat on the left but not on the right.  Tongue midline right lower facial weakness.  Dense  right hemiplegia with only trace withdrawal greater in the right lower and upper extremity.  Purposeful antigravity movements on the left side.   ASSESSMENT/PLAN [name] is a 86 y.o. female with history of diabetes, hypertension, congestive heart failure, colon polyps presenting with aphasia and R sided weakness which progressed to lethargy and emesis w/ assymetric pupils.   Stroke:   L basal ganglia hemorrhage secondary to hypertension  Code Stroke CT head L thalamic and basal ganglia IPH w/ local mass effect and partial effacement 3rd ventricle w/ 56mm midline shift. Small vessel disease. Atrophy. Old pontine lacune. Anterior R frontal lobe meningioma vs calcification.   CT head after intubation unchanged  MRI  No underlying basal ganglia lesion. Extensive small vessel disease.   MRA  Intracranial atherosclerosis.   Carotid Doppler  pending   2D Echo pending   LDL UTC as TG > 400 mg/dL, direct LDL 756.4   PPIR5J 8.6  VTE prophylaxis - SCDs   aspirin 81 mg daily prior to admission, now on No antithrombotic given hemorrhage   Therapy recommendations:  pending   Disposition:  pending   Acute hypoxic and hypercapnic respiratory failure due to hemorrhagic stroke  Secondary to stroke  Intubated in the ED d/t lethargy  CCM on board   Hypertension  Home meds:  catapress 0.2 am + 0.3 pm; lasix 20 (w/ K), metoprolol 200 XL  BP as high as 171/157 on admission  . On Cleviprex . Increase SBP goal < 160 . BP now stable . Long-term BP goal normotensive  Hyperlipidemia  Home meds:  pravachol 20  Now on lipitor 80  Will hold statin in setting of acute hemorrhage   LDL UTC d/t TG > 400  mg/dL, Direct LDL 884.1   Resume statin at discharge  Diabetes type II Uncontrolled  Home meds:  70/30 80u am + lunch, 70u hs  HgbA1c 8.6, goal < 7.0  CBGs Recent Labs    01/06/20 0751 01/06/20 0900 01/06/20 1010  GLUCAP 245* 306* 285*       SSI  Dysphagia . Secondary to stroke . NPO . Cortrak w/ TF . MBSS . Speech on board   Other Stroke Risk Factors  Advanced Age >/= 80   Morbid Obesity, Body mass index is 41.32 kg/m., BMI >/= 30 associated with increased stroke risk, recommend weight loss, diet and exercise as appropriate   Family hx stroke (brother)  Other Active Problems  CKD stage 3b Cre 1.39->1.57 - CCM temporoparietal  address   D/c foley and art line    Leukocytosis WBC 15.3 - CCM to address   Hospital day # 2 Plan extubate as tolerated as per critical care team.  Change systolic blood pressure goal to below 160 and start oral blood pressure medications through core track tube.  Check swallow eval after extubation.  Blood sugar and white count are elevated and CCM to address this.  Long discussion with patient and daughter at the bedside and answered questions.  Discussed with Dr. Merrily Pew critical care medicine This patient is critically ill and at significant risk of neurological worsening, death and care requires constant monitoring  of vital signs, hemodynamics,respiratory and cardiac monitoring, extensive review of multiple databases, frequent neurological assessment, discussion with family, other specialists and medical decision making of high complexity.I have made any additions or clarifications directly to the above note.This critical care time does not reflect procedure time, or teaching time or supervisory time of PA/NP/Med Resident etc but could involve care discussion time.  I spent 30 minutes of neurocritical care time  in the care of  this patient.      Delia Heady, MD   To contact Stroke Continuity provider, please refer to [email or web address]. After hours, contact General Neurology

## 2020-01-06 NOTE — Progress Notes (Signed)
SLP Cancellation Note  Patient Details Name: SHARNETTE KITAMURA MRN: [id number] DOB: [birth year]   Cancelled evaluation:   Pt extubated a few minutes ago.  Has cortrak.  Not sufficiently alert at this time; spoke with Dr. Pearlean Brownie - will follow for readiness for swallow/communication evaluations.  Charlyne Robertshaw L. Samson Frederic, MA CCC/SLP Acute Rehabilitation Services Office number [phone] Pager [phone]          Blenda Mounts Laurice 01/06/2020, 10:33 AM

## 2020-01-06 NOTE — Progress Notes (Addendum)
NAME:  [name] [name], MRN:  [id number], DOB:  1932-04-26, LOS: 2 ADMISSION DATE:  01/04/2020, CONSULTATION DATE: 01/04/2020 REFERRING MD:  Gordy Councilman, MD, CHIEF COMPLAINT: Right-sided weakness  Brief History   86 year old female who presented with sudden onset of aphasia and right-sided weakness, noted to have left-sided basal ganglia/thalamic intraparenchymal hemorrhage without intraventricular extension.  She was intubated in the emergency department  History of present illness   86 year old female with diabetes and hypertension was brought in the emergency department with a complaint of sudden onset of right-sided weakness and aphasia, while she was at church.  Stroke code was called, patient was rushed to CT head noncontrast which showed left-sided intraparenchymal basal ganglia/thalamic hemorrhage without intraventricular extension.  Apparently her initial GCS was between 13-15 but then she started getting lethargic, so was intubated for airway protection  Past Medical History  Diabetes mellitus Hypertension CVA  Significant Hospital Events   Admitted to ICU on 11/7  Consults:  Neurology PCCM  Procedures:  ETT 11/7 >>  Significant Diagnostic Tests:  CT head: 1.3 x 2.7 x 1.5 cm acute parenchymal hemorrhage centered within the left thalamus and basal ganglia. Local mass effect with partial effacement of the third ventricle and 2 mm rightward midline shift at the level of the septum pellucidum. Known chronic pontine lacunar infarct.  MRI brain 11/8> no underlying mass or vascular lesion, chronic microvascular lesions  Micro Data:  11/7 influenza negative 11/7 Covid negative 11/7 MRSA screen >>  Antimicrobials:    Interim history/subjective:  Following commands, denies pain Tolerating 8/5 PS/CPAP  Objective   Blood pressure (!) 132/46, pulse (!) 105, temperature 99.6 F (37.6 C), temperature source Axillary, resp. rate 19, height 5\' 3"  (1.6 m), weight 105.8 kg,  SpO2 98 %.    Vent Mode: PRVC FiO2 (%):  [40 %] 40 % Set Rate:  [20 bmp] 20 bmp Vt Set:  [410 mL] 410 mL PEEP:  [5 cmH20] 5 cmH20 Plateau Pressure:  [16 cmH20-18 cmH20] 18 cmH20   Intake/Output Summary (Last 24 hours) at 01/06/2020 0739 Last data filed at 01/06/2020 13/10/2019 Gross per 24 hour  Intake 479.75 ml  Output 1450 ml  Net -970.25 ml   Filed Weights   01/04/20 1300  Weight: 105.8 kg    Examination:  General: critically ill appearing elderly woman laying in bed in NAD, appears stated age HENT: [place]/AT, eyes anicteric, oral mucosa moist. ETT in place. Lungs: CTAB, distant breath sounds left, on pressure support  Cardiovascular: tachycardic, regular rhythm, no murmurs Abdomen: soft, NT, ND Extremities: mild LE edema, no clubbing or cyanosis. Neuro: opens eyes to verbal stimulation, RASS -1. Tracks, crossed midline to the right but does not look completely right. Extremities: moves RUE and RLE, no movement RUE, decreased RLE but moves to stimuli, plantar upgoing right; downgoing left Skin: no rashes or wounds.  Resolved Hospital Problem list     Assessment & Plan:  Acute left basal ganglia/thalamic intraparenchymal hemorrhage with slight 2 mm midline shift and ICH score of 2-- stable size on follow up MRI. No underlying mass or vascular lesion to explain her bleed. Echo with EF 55-60%, elevated LVDP. Carotid 13/07/21 with high resistance vertebral arteries.  -cont to hold chemical DVT prophylaxis; avoid AC and AP meds -hourly neuro checks -minimize sedation -maintain SBP <160- cleviprex PRN, labetalol PRN -neuroprotective measures- elevated HOB  Acute hypoxic and hypercapnic respiratory failure due to hemorrhagic stroke -con't LVV, 4-8cc/kg IBW with goal Pplat <30 and DP <15 -daily  SAT & SBT when appropraite. Mental status improved, tolerating 8/5 PS, will try to extubate today.  -titrate down FiO2 as appropriate -VAP prevention protocol. -cont TF today  Diabetes type  2 Hypertension Glucose >400. Started on insulin gtt this morning.  -stop insulin gtt -SSI resistant, q4h 6U novolog -lantus 45U bid -cont TF coverage -goal BG 140-180  CKD Stage III At baseline. Cont. Daily BMP   Best practice:  Diet: NPO, tube feeds Pain/Anxiety/Delirium protocol (if indicated): Fentanyl/propofol VAP protocol (if indicated): Yes DVT prophylaxis: SCD GI prophylaxis: Protonix Glucose control: SSI Mobility: Bedrest Code Status: Full code Family Communication: daughter updated at bedside Disposition: ICU  Labs   CBC: Recent Labs  Lab 01/04/20 1307 01/04/20 1614 01/05/20 0612 01/05/20 0919 01/06/20 0512  WBC 10.1  --   --   --  15.3*  NEUTROABS 6.6  --   --   --   --   HGB 15.4*  16.7* 15.0 14.6 13.6 13.5  HCT 48.8*  49.0* 44.0 43.0 40.0 41.6  MCV 94.0  --   --   --  92.4  PLT PLATELET CLUMPS NOTED ON SMEAR, COUNT APPEARS ADEQUATE  --   --   --  209    Basic Metabolic Panel: Recent Labs  Lab 01/04/20 1307 01/04/20 1614 01/05/20 0612 01/05/20 0919 01/06/20 0512  NA 140  143 142 139 139 138  K 3.9  3.8 3.0* 4.2 3.9 3.9  CL 103  102  --   --   --  100  CO2 24  --   --   --  27  GLUCOSE 139*  138*  --   --   --  407*  BUN 31*  34*  --   --   --  43*  CREATININE 1.39*  1.50*  --   --   --  1.57*  CALCIUM 9.6  --   --   --  8.7*  MG  --   --   --   --  1.7  PHOS  --   --   --   --  3.7   GFR: Estimated Creatinine Clearance: 30.5 mL/min (A) (by C-G formula based on SCr of 1.57 mg/dL (H)). Recent Labs  Lab 01/04/20 1307 01/06/20 0512  WBC 10.1 15.3*    Liver Function Tests: Recent Labs  Lab 01/04/20 1307  AST 31  ALT 25  ALKPHOS 57  BILITOT 0.5  PROT 7.6  ALBUMIN 3.9   No results for input(s): LIPASE, AMYLASE in the last 168 hours. No results for input(s): AMMONIA in the last 168 hours.  ABG    Component Value Date/Time   PHART 7.361 01/05/2020 0919   PCO2ART 51.9 (H) 01/05/2020 0919   PO2ART 85 01/05/2020 0919    HCO3 29.3 (H) 01/05/2020 0919   TCO2 31 01/05/2020 0919   O2SAT 96.0 01/05/2020 0919     Coagulation Profile: Recent Labs  Lab 01/04/20 1307  INR 0.9    Cardiac Enzymes: No results for input(s): CKTOTAL, CKMB, CKMBINDEX, TROPONINI in the last 168 hours.  HbA1C: Hgb A1c MFr Bld  Date/Time Value Ref Range Status  01/04/2020 04:01 PM 8.6 (H) 4.8 - 5.6 % Final    Comment:    (NOTE) Pre diabetes:          5.7%-6.4%  Diabetes:              >6.4%  Glycemic control for   <7.0% adults with diabetes  CBG: Recent Labs  Lab 01/05/20 2022 01/05/20 2356 01/06/20 0416 01/06/20 0529 01/06/20 0645  GLUCAP 324* 335* 419* 347* 333*    Yaslyn Cumby A Teancum Brule, DO 01/06/20 7:39 AM [facility] Pager: [phone]

## 2020-01-06 NOTE — Progress Notes (Signed)
eLink Physician-Brief Progress Note Patient Name: [name] [name] DOB: 04-Aug-1932 MRN: [id number]   Date of Service  01/06/2020  HPI/Events of Note  Glucose now 400s  eICU Interventions  Will start endotool     Intervention Category Major Interventions: Hyperglycemia - active titration of insulin therapy  Darl Pikes 01/06/2020, 5:03 AM

## 2020-01-06 NOTE — Progress Notes (Signed)
Inpatient Diabetes Program Recommendations  AACE/ADA: New Consensus Statement on Inpatient Glycemic Control (2015)  Target Ranges:  Prepandial:   less than 140 mg/dL      Peak postprandial:   less than 180 mg/dL (1-2 hours)      Critically ill patients:  140 - 180 mg/dL   Lab Results  Component Value Date   GLUCAP 245 (H) 01/06/2020   HGBA1C 8.6 (H) 01/04/2020    Review of Glycemic Control Results for ALAIZA, [name] [name] (MRN [id number]) as of 01/06/2020 09:06  Ref. Range 01/06/2020 04:16 01/06/2020 05:29 01/06/2020 06:45 01/06/2020 07:51  Glucose-Capillary Latest Ref Range: 70 - 99 mg/dL 258 (H) 527 (H) 782 (H) 245 (H)   Diabetes history: Type 2 Dm Outpatient Diabetes medications: Novolog 70/30- 100 B/ 60-80 L/ 70 D Current orders for Inpatient glycemic control: IV insulin Vital @ 20 ml/hr  Inpatient Diabetes Program Recommendations:    Noted initiation of IV insulin related to elevated glucose trends.   Assuming related to increased insulin needs outpatient (total of 168 units of basal in 24 hr per med rec).   When ready to transition, consider: -Levemir 50 units to be given two hours prior to discontinuation of IV insulin, then BID to follow -Novolog 0-20 units Q4H - Novolog 8 units Q4H  Thanks, Lujean Rave, MSN, RNC-OB Diabetes Coordinator [phone] (8a-5p)

## 2020-01-06 NOTE — Progress Notes (Signed)
Rehab Admissions Coordinator Note:  Per PT and OT recommendation, this patient was screened by Cheri Rous for appropriateness for an Inpatient Acute Rehab Consult. Noted pt was just extubated 11/9. We will follow for continued progress with therapy and tolerance prior to requesting consult order.    Cheri Rous 01/06/2020, 1:51 PM  I can be reached at [phone].

## 2020-01-06 NOTE — Progress Notes (Signed)
Occupational Therapy Treatment Patient Details Name: [name] [name] MRN: [id number] DOB: 06-20-1932 Today's Date: 01/06/2020    History of present illness The pt is an 86 yo female presenting with acute onset aphasia and R-sided weakness. Imaging revealed L basal ganglia/thalamic hemorrhage with 21mm midline shift and no intraventricular extension. The pt was intubated 11/7. PMH includes: DM II, HTN, CHF, and colon polyps. pt extubated 11/9.   OT comments  Pt seen in conjunction with PT, now extubated. Pt continues to require significant assist for all aspect of mobility given global weakness and fatigue. Pt with notable weakness on R side and only trace/spontaenous movements noted to LE/UE. She tolerated sitting EOB with mod-maxA and sit<>stand trials (via totalA +2). HR up to the high 120s with additional VSS. Will continue to recommend CIR for now and monitor for pt progress/tolerance to therapies. Acute OT to follow.    Follow Up Recommendations  CIR    Equipment Recommendations  Other (comment) (TBD)    Recommendations for Other Services Rehab consult    Precautions / Restrictions Precautions Precautions: Fall Precaution Comments: R-sided hemiparesis.  Restrictions Weight Bearing Restrictions: No       Mobility Bed Mobility Overal bed mobility: Needs Assistance Bed Mobility: Supine to Sit;Sit to Supine;Rolling Rolling: Total assist   Supine to sit: +2 for physical assistance;+2 for safety/equipment;Total assist Sit to supine: +2 for physical assistance;+2 for safety/equipment;Total assist   General bed mobility comments: pt with very minimal attempts to assist given global weakness even with stronger L side. requires use of bed pad and totalA to scoot to EOB   Transfers Overall transfer level: Needs assistance Equipment used: 2 person hand held assist Transfers: Sit to/from Stand Sit to Stand: Total assist;+2 physical assistance;+2 safety/equipment          General transfer comment: requires use of bedpad to support hips, soft buckle at R knee, completed x2; requires asisst for neck extension     Balance Overall balance assessment: Needs assistance Sitting-balance support: Single extremity supported;Feet supported Sitting balance-Leahy Scale: Poor Sitting balance - Comments: pt with truncal initiation though continues to require maxA initially with intermittent bouts of mod A                                   ADL either performed or assessed with clinical judgement   ADL Overall ADL's : Needs assistance/impaired                                       General ADL Comments: pt currently totalA; hand over hand assist provided for simple face washing task                        Cognition Arousal/Alertness: Lethargic Behavior During Therapy: Flat affect Overall Cognitive Status: Impaired/Different from baseline Area of Impairment: Following commands;Awareness                       Following Commands: Follows one step commands inconsistently;Follows one step commands with increased time   Awareness: Intellectual   General Comments: delayed command follow today, lethargic but does arouse with activity, mostly nodding head in response to questions with no attempts to vocalize         Exercises Exercises: Other exercises Other Exercises Other Exercises: warmup  AA/PROM with graded resistance as able to all extremities    Shoulder Instructions       General Comments Hr up to the high 120s with activity     Pertinent Vitals/ Pain       Pain Assessment: Faces Faces Pain Scale: Hurts a little bit Pain Location: generalized grimacing Pain Descriptors / Indicators: Discomfort;Grimacing Pain Intervention(s): Monitored during session  Home Living                                          Prior Functioning/Environment              Frequency  Min 2X/week         Progress Toward Goals  OT Goals(current goals can now be found in the care plan section)  Progress towards OT goals: Progressing toward goals  Acute Rehab OT Goals Patient Stated Goal: pt unable to state, no famly present OT Goal Formulation: Patient unable to participate in goal setting Time For Goal Achievement: 01/19/20 Potential to Achieve Goals: Good ADL Goals Pt Will Perform Grooming: sitting;with min assist Pt/caregiver will Perform Home Exercise Program: Increased ROM;Increased strength;Right Upper extremity;With minimal assist;With written HEP provided Additional ADL Goal #1: Pt will maintain static balance EOB with minA as precursor to EOB/OOB ADL. Additional ADL Goal #2: Pt will follow multi-step commands with 50% accuracy and no more than min cues. Additional ADL Goal #3: Pt will perform bed mobility with modA as precursor to EOB/OOB ADL  Plan Discharge plan remains appropriate    Co-evaluation    PT/OT/SLP Co-Evaluation/Treatment: Yes Reason for Co-Treatment: Complexity of the patient's impairments (multi-system involvement);For patient/therapist safety;To address functional/ADL transfers          AM-PAC OT "6 Clicks" Daily Activity     Outcome Measure   Help from another person eating meals?: Total Help from another person taking care of personal grooming?: Total Help from another person toileting, which includes using toliet, bedpan, or urinal?: Total Help from another person bathing (including washing, rinsing, drying)?: Total Help from another person to put on and taking off regular upper body clothing?: Total Help from another person to put on and taking off regular lower body clothing?: Total 6 Click Score: 6    End of Session Equipment Utilized During Treatment: Oxygen  OT Visit Diagnosis: Muscle weakness (generalized) (M62.81);Other abnormalities of gait and mobility (R26.89);Other symptoms and signs involving cognitive function;Hemiplegia and  hemiparesis Hemiplegia - Right/Left: Right   Activity Tolerance Patient tolerated treatment well;Patient limited by fatigue   Patient Left in bed;with call bell/phone within reach;with bed alarm set   Nurse Communication Mobility status        Time: 5852-7782 OT Time Calculation (min): 29 min  Charges: OT General Charges $OT Visit: 1 Visit OT Treatments $Self Care/Home Management : 8-22 mins  Marcy Siren, OT Acute Rehabilitation Services Pager [phone] Office [phone]    Orlando Penner 01/06/2020, 5:53 PM

## 2020-01-06 NOTE — Procedures (Signed)
Extubation Procedure Note  Patient Details:   Name: [name] [name] DOB: May 28, 1932 MRN: [id number]   Airway Documentation:    Vent end date: 01/06/20 Vent end time: 1025   Evaluation  O2 sats: stable throughout Complications: No apparent complications Patient did tolerate procedure well. Bilateral Breath Sounds: Clear   Yes   Prior to extubation, pt did have a positive cuff leak. Pt was extubated to [facility]. Pt tolerated procedure well with SVS. RT will continue to monitor pt.  Megan Mans 01/06/2020, 10:26 AM

## 2020-01-06 NOTE — Progress Notes (Signed)
Physical Therapy Treatment Patient Details Name: [name] [name] MRN: [id number] DOB: March 13, 1932 Today's Date: 01/06/2020    History of Present Illness The pt is an 86 yo female presenting with acute onset aphasia and R-sided weakness. Imaging revealed L basal ganglia/thalamic hemorrhage with 7mm midline shift and no intraventricular extension. The pt was intubated 11/7. PMH includes: DM II, HTN, CHF, and colon polyps. pt extubated 11/9.    PT Comments    Pt progressing slowly toward goals.  Emphasis on warm up exercise, transition to EOB, sitting balance, sit to stand at EOB before returning to supine   Follow Up Recommendations  CIR     Equipment Recommendations  Other (comment) (TBD)    Recommendations for Other Services Rehab consult     Precautions / Restrictions Precautions Precautions: Fall Precaution Comments: R-sided hemiparesis.  Restrictions Weight Bearing Restrictions: No    Mobility  Bed Mobility Overal bed mobility: Needs Assistance Bed Mobility: Supine to Sit;Sit to Supine;Rolling Rolling: Total assist   Supine to sit: +2 for physical assistance;+2 for safety/equipment;Total assist Sit to supine: +2 for physical assistance;+2 for safety/equipment;Total assist   General bed mobility comments: pt with very minimal attempts to assist given global weakness even with stronger L side. requires use of bed pad and totalA to scoot to EOB   Transfers Overall transfer level: Needs assistance Equipment used: 2 person hand held assist Transfers: Sit to/from Stand Sit to Stand: Total assist;+2 physical assistance;+2 safety/equipment         General transfer comment: requires use of bedpad to support hips, soft buckle at R knee, completed x2; requires asisst for neck extension   Ambulation/Gait                 Stairs             Wheelchair Mobility    Modified Rankin (Stroke Patients Only)       Balance Overall balance assessment: Needs  assistance Sitting-balance support: Single extremity supported;Feet supported Sitting balance-Leahy Scale: Poor Sitting balance - Comments: pt with truncal initiation though continues to require maxA initially with intermittent bouts of mod A                                    Cognition Arousal/Alertness: Lethargic Behavior During Therapy: Flat affect Overall Cognitive Status: Impaired/Different from baseline Area of Impairment: Following commands;Awareness                       Following Commands: Follows one step commands inconsistently;Follows one step commands with increased time   Awareness: Intellectual   General Comments: delayed command follow today, lethargic but does arouse with activity, mostly nodding head in response to questions with no attempts to vocalize       Exercises Other Exercises Other Exercises: warmup AA/PROM with graded resistance as able to all extremities     General Comments General comments (skin integrity, edema, etc.): HR up into the hight 120'w      Pertinent Vitals/Pain Pain Assessment: Faces Faces Pain Scale: Hurts a little bit Pain Location: generalized grimacing Pain Descriptors / Indicators: Discomfort;Grimacing Pain Intervention(s): Monitored during session    Home Living                      Prior Function            PT Goals (current goals can now  be found in the care plan section) Acute Rehab PT Goals Patient Stated Goal: pt unable to state, no famly present PT Goal Formulation: With patient Time For Goal Achievement: 01/19/20 Potential to Achieve Goals: Fair Progress towards PT goals: Progressing toward goals    Frequency    Min 4X/week      PT Plan Current plan remains appropriate    Co-evaluation   Reason for Co-Treatment: Complexity of the patient's impairments (multi-system involvement) PT goals addressed during session: Mobility/safety with mobility;Strengthening/ROM         AM-PAC PT "6 Clicks" Mobility   Outcome Measure  Help needed turning from your back to your side while in a flat bed without using bedrails?: Total Help needed moving from lying on your back to sitting on the side of a flat bed without using bedrails?: Total Help needed moving to and from a bed to a chair (including a wheelchair)?: Total Help needed standing up from a chair using your arms (e.g., wheelchair or bedside chair)?: Total Help needed to walk in hospital room?: Total Help needed climbing 3-5 steps with a railing? : Total 6 Click Score: 6    End of Session Equipment Utilized During Treatment: Oxygen Activity Tolerance: Patient tolerated treatment well;Patient limited by fatigue Patient left: in bed;with call bell/phone within reach;with restraints reapplied Nurse Communication: Mobility status PT Visit Diagnosis: Other abnormalities of gait and mobility (R26.89);Muscle weakness (generalized) (M62.81)     Time: 6295-2841 PT Time Calculation (min) (ACUTE ONLY): 29 min  Charges:  $Neuromuscular Re-education: 8-22 mins                     01/06/2020  Jacinto Halim., PT Acute Rehabilitation Services [phone]  (pager) [phone]  (office)   Eliseo Gum [place] 01/06/2020, 6:08 PM

## 2020-01-07 DIAGNOSIS — I610 Nontraumatic intracerebral hemorrhage in hemisphere, subcortical: Secondary | ICD-10-CM | POA: Diagnosis not present

## 2020-01-07 LAB — BASIC METABOLIC PANEL
Anion gap: 13 (ref 5–15)
Anion gap: 10 (ref 5–15)
BUN: 34 mg/dL — ABNORMAL HIGH (ref 8–23)
BUN: 32 mg/dL — ABNORMAL HIGH (ref 8–23)
CO2: 28 mmol/L (ref 22–32)
CO2: 32 mmol/L (ref 22–32)
Calcium: 8.8 mg/dL — ABNORMAL LOW (ref 8.9–10.3)
Calcium: 9.1 mg/dL (ref 8.9–10.3)
Chloride: 102 mmol/L (ref 98–111)
Chloride: 101 mmol/L (ref 98–111)
Creatinine, Ser: 1.25 mg/dL — ABNORMAL HIGH (ref 0.44–1.00)
Creatinine, Ser: 1.26 mg/dL — ABNORMAL HIGH (ref 0.44–1.00)
GFR, Estimated: 42 mL/min — ABNORMAL LOW (ref >60)
GFR, Estimated: 42 mL/min — ABNORMAL LOW (ref >60)
Glucose, Bld: 230 mg/dL — ABNORMAL HIGH (ref 70–99)
Glucose, Bld: 227 mg/dL — ABNORMAL HIGH (ref 70–99)
Potassium: 3.7 mmol/L (ref 3.5–5.1)
Potassium: 3.3 mmol/L — ABNORMAL LOW (ref 3.5–5.1)
Sodium: 143 mmol/L (ref 135–145)
Sodium: 143 mmol/L (ref 135–145)

## 2020-01-07 LAB — CBC
HCT: 43.8 % (ref 36.0–46.0)
HCT: 44.4 % (ref 36.0–46.0)
Hemoglobin: 14.3 g/dL (ref 12.0–15.0)
Hemoglobin: 14.3 g/dL (ref 12.0–15.0)
MCH: 29.5 pg (ref 26.0–34.0)
MCH: 29.4 pg (ref 26.0–34.0)
MCHC: 32.6 g/dL (ref 30.0–36.0)
MCHC: 32.2 g/dL (ref 30.0–36.0)
MCV: 90.5 fL (ref 80.0–100.0)
MCV: 91.4 fL (ref 80.0–100.0)
Platelets: 110 10*3/uL — ABNORMAL LOW (ref 150–400)
Platelets: 184 10*3/uL (ref 150–400)
RBC: 4.84 MIL/uL (ref 3.87–5.11)
RBC: 4.86 MIL/uL (ref 3.87–5.11)
RDW: 14 % (ref 11.5–15.5)
RDW: 14 % (ref 11.5–15.5)
WBC: 13.3 10*3/uL — ABNORMAL HIGH (ref 4.0–10.5)
WBC: 11.5 10*3/uL — ABNORMAL HIGH (ref 4.0–10.5)
nRBC: 0 % (ref 0.0–0.2)
nRBC: 0 % (ref 0.0–0.2)

## 2020-01-07 LAB — GLUCOSE, CAPILLARY
Glucose-Capillary: 212 mg/dL — ABNORMAL HIGH (ref 70–99)
Glucose-Capillary: 201 mg/dL — ABNORMAL HIGH (ref 70–99)
Glucose-Capillary: 245 mg/dL — ABNORMAL HIGH (ref 70–99)
Glucose-Capillary: 231 mg/dL — ABNORMAL HIGH (ref 70–99)
Glucose-Capillary: 223 mg/dL — ABNORMAL HIGH (ref 70–99)
Glucose-Capillary: 247 mg/dL — ABNORMAL HIGH (ref 70–99)

## 2020-01-07 LAB — POCT I-STAT 7, (LYTES, BLD GAS, ICA,H+H)
Acid-Base Excess: 12 mmol/L — ABNORMAL HIGH (ref 0.0–2.0)
Bicarbonate: 38.4 mmol/L — ABNORMAL HIGH (ref 20.0–28.0)
Calcium, Ion: 1.18 mmol/L (ref 1.15–1.40)
HCT: 39 % (ref 36.0–46.0)
Hemoglobin: 13.3 g/dL (ref 12.0–15.0)
O2 Saturation: 95 %
Patient temperature: 98.2
Potassium: 3.3 mmol/L — ABNORMAL LOW (ref 3.5–5.1)
Sodium: 144 mmol/L (ref 135–145)
TCO2: 40 mmol/L — ABNORMAL HIGH (ref 22–32)
pCO2 arterial: 56.6 mmHg — ABNORMAL HIGH (ref 32.0–48.0)
pH, Arterial: 7.438 (ref 7.350–7.450)
pO2, Arterial: 73 mmHg — ABNORMAL LOW (ref 83.0–108.0)

## 2020-01-07 LAB — PHOSPHORUS
Phosphorus: 2.5 mg/dL (ref 2.5–4.6)
Phosphorus: 2.7 mg/dL (ref 2.5–4.6)

## 2020-01-07 LAB — MAGNESIUM
Magnesium: 1.6 mg/dL — ABNORMAL LOW (ref 1.7–2.4)
Magnesium: 1.7 mg/dL (ref 1.7–2.4)

## 2020-01-07 MED ORDER — INSULIN GLARGINE 100 UNIT/ML ~~LOC~~ SOLN
50.0000 [IU] | Freq: Two times a day (BID) | SUBCUTANEOUS | Status: DC
  Administered 2020-01-07 – 2020-01-16 (×19): 50 [IU] via SUBCUTANEOUS
  Filled 2020-01-07 (×20): qty 0.5

## 2020-01-07 MED ORDER — MAGNESIUM SULFATE 2 GM/50ML IV SOLN
2.0000 g | Freq: Once | INTRAVENOUS | Status: AC
  Filled 2020-01-07: qty 50

## 2020-01-07 MED ORDER — METOPROLOL TARTRATE 50 MG PO TABS
50.0000 mg | ORAL_TABLET | Freq: Two times a day (BID) | ORAL | Status: DC
  Administered 2020-01-07 (×2): 50 mg
  Filled 2020-01-07 (×2): qty 1

## 2020-01-07 MED ORDER — FUROSEMIDE 20 MG PO TABS
20.0000 mg | ORAL_TABLET | Freq: Every day | ORAL | Status: DC
  Administered 2020-01-07: 20 mg via ORAL
  Filled 2020-01-07 (×2): qty 1

## 2020-01-07 MED ORDER — POTASSIUM CHLORIDE 20 MEQ/15ML (10%) PO SOLN
40.0000 meq | Freq: Two times a day (BID) | ORAL | Status: DC
  Administered 2020-01-07: 40 meq via ORAL
  Filled 2020-01-07 (×2): qty 30

## 2020-01-07 MED ORDER — POTASSIUM CHLORIDE 20 MEQ/15ML (10%) PO SOLN
40.0000 meq | Freq: Once | ORAL | Status: AC
  Administered 2020-01-07: 40 meq

## 2020-01-07 MED ORDER — MAGNESIUM SULFATE 2 GM/50ML IV SOLN
2.0000 g | Freq: Once | INTRAVENOUS | Status: AC
  Administered 2020-01-07: 2 g via INTRAVENOUS
  Filled 2020-01-07: qty 50

## 2020-01-07 MED ORDER — POTASSIUM CHLORIDE 20 MEQ/15ML (10%) PO SOLN: 40.0000 meq | Freq: Once | ORAL | Status: DC

## 2020-01-07 MED ORDER — GLUCERNA 1.2 CAL PO LIQD
1000.0000 mL | ORAL | Status: DC
  Administered 2020-01-07 – 2020-01-12 (×6): 1000 mL
  Filled 2020-01-07 (×9): qty 1000

## 2020-01-07 NOTE — Progress Notes (Signed)
Nutrition Follow-up  DOCUMENTATION CODES:   Obesity unspecified  INTERVENTION:   Tube feeding via Cortrak: Glucerna 1.2 at 60 ml/h (1440 ml per day) Prosource TF 45 ml TID  Provides 1848 kcal, 119 gm protein, 1167 ml free water daily  NUTRITION DIAGNOSIS:   Inadequate oral intake related to inability to eat as evidenced by NPO status. Ongoing.   GOAL:   Provide needs based on ASPEN/SCCM guidelines Progressing.   MONITOR:   TF tolerance, Labs  REASON FOR ASSESSMENT:   Consult, Ventilator Enteral/tube feeding initiation and management  ASSESSMENT:   Pt with PMH of DM and HTN admitted with L basal ganglia/thalamic intraparenchymal hemorrhage with 2 mm midline shift. Pt intubated for airway protection.    11/8 Cortrak placed 11/9 extubated  Pt discussed during ICU rounds and with RN.  Per staff pt at high risk for re-intubation. Plan for family meeting, failed swallow evaluation today.    Medications reviewed and include: colace, lasix, SSI, 6 units novolog every 4 hours, 50 units lantus BID, miralax, senokot-s  Labs reviewed: K+ 3.3 HgbA1C: 8.6 CBG's: 245-231  Current TF: Vital AF 1.2 @ 20 ml/hr with 45 ml Prosource TF TID Provides: 696 kcal and 69 grams protein   Diet Order:   Diet Order            Diet NPO time specified  Diet effective now                 EDUCATION NEEDS:   Not appropriate for education at this time  Skin:  Skin Assessment: Reviewed RN Assessment (MASD - abdomen)  Last BM:  unknown  Height:   Ht Readings from Last 1 Encounters:  01/04/20 5\' 3"  (1.6 m)    Weight:   Wt Readings from Last 1 Encounters:  01/04/20 105.8 kg    Ideal Body Weight:  52.2 kg  BMI:  Body mass index is 41.32 kg/m.  Estimated Nutritional Needs:   Kcal:  1700-1900  Protein:  100-120 grams  Fluid:  > 1.7 L/day  13/07/21., RD, LDN, CNSC See AMiON for contact information

## 2020-01-07 NOTE — Progress Notes (Signed)
STROKE TEAM PROGRESS NOTE   INTERVAL HISTORY Her son is at the bedside, patient remains drowsy but can be easily aroused and follows simple midline commands and commands on the left side.  She remains with dense right hemiplegia.  Blood pressure adequately controlled.  Vital signs stable.  She has very weak cough and gag CCM as discussed with family DNR status versus reintubation and trach and PEG if she gets worse Vitals:   01/07/20 0600 01/07/20 0700 01/07/20 0800 01/07/20 0900  BP: (!) 128/51 133/71 (!) 127/49 (!) 132/50  Pulse: (!) 111 100 (!) 112 (!) 110  Resp: 20 12 20 19   Temp:   (!) 100.5 F (38.1 C)   TempSrc:   Axillary   SpO2: 96% 95% 97% 96%  Weight:      Height:       CBC:  Recent Labs  Lab 01/04/20 1307 01/04/20 1614 01/07/20 0116 01/07/20 0116 01/07/20 0718 01/07/20 0814  WBC 10.1   < > 13.3*  --  11.5*  --   NEUTROABS 6.6  --   --   --   --   --   HGB 15.4*  16.7*   < > 14.3   < > 14.3 13.3  HCT 48.8*  49.0*   < > 43.8   < > 44.4 39.0  MCV 94.0   < > 90.5  --  91.4  --   PLT PLATELET CLUMPS NOTED ON SMEAR, COUNT APPEARS ADEQUATE   < > 110*  --  184  --    < > = values in this interval not displayed.   Basic Metabolic Panel:  Recent Labs  Lab 01/07/20 0116 01/07/20 0116 01/07/20 0718 01/07/20 0814  NA 143   < > 143 144  K 3.7   < > 3.3* 3.3*  CL 102  --  101  --   CO2 28  --  32  --   GLUCOSE 230*  --  227*  --   BUN 34*  --  32*  --   CREATININE 1.25*  --  1.26*  --   CALCIUM 8.8*  --  9.1  --   MG 1.6*  --  1.7  --   PHOS 2.5  --  2.7  --    < > = values in this interval not displayed.   Lipid Panel:  Recent Labs  Lab 01/04/20 1601 01/04/20 1601 01/06/20 0512  CHOL 201*  --   --   TRIG 654*  606*   < > 398*  HDL 37*  --   --   CHOLHDL 5.4  --   --   VLDL UNABLE TO CALCULATE IF TRIGLYCERIDE OVER 400 mg/dL  --   --   LDLCALC UNABLE TO CALCULATE IF TRIGLYCERIDE OVER 400 mg/dL  --   --    < > = values in this interval not displayed.    HgbA1c:  Recent Labs  Lab 01/04/20 1601  HGBA1C 8.6*   Urine Drug Screen: No results for input(s): LABOPIA, COCAINSCRNUR, LABBENZ, AMPHETMU, THCU, LABBARB in the last 168 hours.  Alcohol Level No results for input(s): ETH in the last 168 hours.  IMAGING past 24 hours No results found.  PHYSICAL EXAM     Patient obese elderly Caucasian lady who appears not to be in distress.. . Afebrile. Head is nontraumatic. Neck is supple without bruit. Cardiac exam no murmur or gallop. Lungs are clear to auscultation. Distal pulses are well felt. Neurological Exam :  Patient is drowsy but awakens easily..  She follows simple commands on the left side and midline but not on the right.  Pupils are 3 to 4 mm reactive.  Left gaze preference but can look partially to the right.  Blinks to threat on the left but not on the right.  Tongue midline right lower facial weakness.  Dense right hemiplegia with only trace withdrawal greater in the right lower and upper extremity.  Purposeful antigravity movements on the left side.   ASSESSMENT/PLAN [name] [name] is a 86 y.o. female with history of diabetes, hypertension, congestive heart failure, colon polyps presenting with aphasia and R sided weakness which progressed to lethargy and emesis w/ assymetric pupils.   Stroke:   L basal ganglia hemorrhage secondary to hypertension  Code Stroke CT head L thalamic and basal ganglia IPH w/ local mass effect and partial effacement 3rd ventricle w/ 71mm midline shift. Small vessel disease. Atrophy. Old pontine lacune. Anterior R frontal lobe meningioma vs calcification.   CT head after intubation unchanged  MRI  No underlying basal ganglia lesion. Extensive small vessel disease.   MRA  Intracranial atherosclerosis.   Carotid Doppler  pending   2D Echo pending   LDL UTC as TG > 400 mg/dL, direct LDL 650.3   TWSF6C 8.6  VTE prophylaxis - SCDs   aspirin 81 mg daily prior to admission, now on No  antithrombotic given hemorrhage   Therapy recommendations:  pending   Disposition:  pending   Acute hypoxic and hypercapnic respiratory failure due to hemorrhagic stroke  Secondary to stroke  Intubated in the ED d/t lethargy  CCM on board   Hypertension  Home meds:  catapress 0.2 am + 0.3 pm; lasix 20 (w/ K), metoprolol 200 XL  BP as high as 171/157 on admission  . On Cleviprex . Increase SBP goal < 160 . BP now stable . Long-term BP goal normotensive  Hyperlipidemia  Home meds:  pravachol 20  Now on lipitor 80  Will hold statin in setting of acute hemorrhage   LDL UTC d/t TG > 400  mg/dL, Direct LDL 127.5   Resume statin at discharge  Diabetes type II Uncontrolled  Home meds:  70/30 80u am + lunch, 70u hs  HgbA1c 8.6, goal < 7.0  CBGs Recent Labs    01/06/20 2320 01/07/20 0321 01/07/20 0751  GLUCAP 206* 212* 201*      SSI  Dysphagia . Secondary to stroke . NPO . Cortrak w/ TF . MBSS . Speech on board   Other Stroke Risk Factors  Advanced Age >/= 27   Morbid Obesity, Body mass index is 41.32 kg/m., BMI >/= 30 associated with increased stroke risk, recommend weight loss, diet and exercise as appropriate   Family hx stroke (brother)  Other Active Problems  CKD stage 3b Cre 1.39->1.57 - CCM temporoparietal  address   D/c foley and art line    Leukocytosis WBC 15.3,->13.3->11.5.  TMax 100.5. blood Cx pending - CCM on board - no abx at this time   Hospital day # 3 Continue ongoing treatment.  CCM closely following her ability to protect airway and family to make decision about DNR versus reintubation in case she gets worse.  Speech therapy to check swallow eval.  Long discussion with son at the bedside and answered questions about his care.  Discussed with Dr. Merrily Pew This patient is critically ill and at significant risk of neurological worsening, death and care requires constant monitoring of vital  signs, hemodynamics,respiratory and cardiac  monitoring, extensive review of multiple databases, frequent neurological assessment, discussion with family, other specialists and medical decision making of high complexity.I have made any additions or clarifications directly to the above note.This critical care time does not reflect procedure time, or teaching time or supervisory time of PA/NP/Med Resident etc but could involve care discussion time.  I spent 30 minutes of neurocritical care time  in the care of  this patient.       Delia Heady, MD   To contact Stroke Continuity provider, please refer to [email or web address]. After hours, contact General Neurology

## 2020-01-07 NOTE — Evaluation (Signed)
Clinical/Bedside Swallow Evaluation Patient Details  Name: [name] [name] MRN: [id number] Date of Birth: 10-Jun-1932  Today's Date: 01/07/2020 Time: SLP Start Time (ACUTE ONLY): 1053 SLP Stop Time (ACUTE ONLY): 1105 SLP Time Calculation (min) (ACUTE ONLY): 12 min  Past Medical History:  Past Medical History:  Diagnosis Date  . Colon polyp   . Diabetes mellitus 1992  . Hypertension 1996  . Pancreatitis    Past Surgical History:  Past Surgical History:  Procedure Laterality Date  . CHOLECYSTECTOMY    . COLONOSCOPY    . ERCP    . PARTIAL HYSTERECTOMY  86years old  . POLYPECTOMY    . UPPER GASTROINTESTINAL ENDOSCOPY    . WRIST FRACTURE SURGERY  left arm,86years old   HPI:  The pt is an 86 yo female presenting with acute onset aphasia and R-sided weakness. Imaging revealed L basal ganglia/thalamic hemorrhage with 76mm midline shift and no intraventricular extension. The pt was intubated 11/7-11/9. PMH includes: DM II, HTN, CHF, and colon polyps.    Assessment / Plan / Recommendation Clinical Impression  Pt has reduced strength and ROM with lingual and facial muscles, and does not initiate a cough or swallow to command. She Kept her eyes closed throughout most of the session but was responding to one-step commands initially, quickly fatiguing and becoming less engaged in functional tasks. Given mentation and high risk for reintubation, PO trials were deferred. Education was provided to son regarding current level of function and POC. Will f/u as indicated - note that family meeting is planned for later today.   SLP Visit Diagnosis: Dysphagia, unspecified (R13.10)    Aspiration Risk  Severe aspiration risk    Diet Recommendation NPO;Alternative means - temporary   Medication Administration: Via alternative means    Other  Recommendations Oral Care Recommendations: Oral care QID Other Recommendations: Have oral suction available   Follow up Recommendations Inpatient Rehab       Frequency and Duration min 2x/week  2 weeks       Prognosis Prognosis for Safe Diet Advancement: Good Barriers to Reach Goals: Cognitive deficits;Language deficits;Severity of deficits      Swallow Study   General HPI: The pt is an 86 yo female presenting with acute onset aphasia and R-sided weakness. Imaging revealed L basal ganglia/thalamic hemorrhage with 78mm midline shift and no intraventricular extension. The pt was intubated 11/7-11/9. PMH includes: DM II, HTN, CHF, and colon polyps.  Type of Study: Bedside Swallow Evaluation Previous Swallow Assessment: none in chart Diet Prior to this Study: NPO;NG Tube Temperature Spikes Noted: Yes Respiratory Status: Nasal cannula History of Recent Intubation: Yes Length of Intubations (days): 2 days Date extubated: 01/06/20 Behavior/Cognition: Lethargic/Drowsy;Requires cueing Oral Care Completed by SLP: Yes Oral Cavity - Dentition: Adequate natural dentition Vision:  (keeps eyes mostly closed) Self-Feeding Abilities: Total assist Patient Positioning: Upright in bed Baseline Vocal Quality: Not observed Volitional Cough: Cognitively unable to elicit Volitional Swallow: Unable to elicit    Oral/Motor/Sensory Function Overall Oral Motor/Sensory Function: Moderate impairment Facial ROM: Reduced right;Suspected CN VII (facial) dysfunction Facial Symmetry: Abnormal symmetry right;Suspected CN VII (facial) dysfunction Facial Strength: Reduced right;Suspected CN VII (facial) dysfunction Lingual ROM: Reduced right;Reduced left;Other (Comment) (anterior protrusion)   Ice Chips Ice chips: Not tested   Thin Liquid Thin Liquid: Not tested    Nectar Thick Nectar Thick Liquid: Not tested   Honey Thick Honey Thick Liquid: Not tested   Puree Puree: Not tested   Solid     Solid: Not tested  Mahala Menghini., M.A. CCC-SLP Acute Rehabilitation Services Pager [phone] Office [phone]  01/07/2020,12:37 PM

## 2020-01-07 NOTE — Progress Notes (Signed)
Inpatient Diabetes Program Recommendations  AACE/ADA: New Consensus Statement on Inpatient Glycemic Control (2015)  Target Ranges:  Prepandial:   less than 140 mg/dL      Peak postprandial:   less than 180 mg/dL (1-2 hours)      Critically ill patients:  140 - 180 mg/dL   Lab Results  Component Value Date   GLUCAP 201 (H) 01/07/2020   HGBA1C 8.6 (H) 01/04/2020    Review of Glycemic Control Results for [name] [name], [name] [name] (MRN [id number]) as of 01/07/2020 10:39  Ref. Range 01/06/2020 15:44 01/06/2020 19:46 01/06/2020 23:20 01/07/2020 03:21 01/07/2020 07:51  Glucose-Capillary Latest Ref Range: 70 - 99 mg/dL 222 (H) 979 (H) 892 (H) 212 (H) 201 (H)    Current orders for Inpatient glycemic control:  Lantus 50 units daily (increased today) Novolog 0-20 units q4h Novolog 6 units q4h Vital 1.2 @ 20 ml/hr  Inpatient Diabetes Program Recommendations:     Novolog 8 units q4h tube feed coverage.  Stop if feeds are held or discontinued  Will continue to follow while inpatient.  Thank you, Dulce Sellar, RN, BSN Diabetes Coordinator Inpatient Diabetes Program [phone] (team pager from 8a-5p)

## 2020-01-07 NOTE — Progress Notes (Signed)
NAME:  [name] [name], MRN:  [id number], DOB:  [birth year], LOS: 3 ADMISSION DATE:  01/04/2020, CONSULTATION DATE: 01/04/2020 REFERRING MD:  Gordy Councilman, MD, CHIEF COMPLAINT: Right-sided weakness  Brief History   86 year old female who presented with sudden onset of aphasia and right-sided weakness, noted to have left-sided basal ganglia/thalamic intraparenchymal hemorrhage without intraventricular extension.  She was intubated in the emergency department  History of present illness   86 year old female with diabetes and hypertension was brought in the emergency department with [name] complaint of sudden onset of right-sided weakness and aphasia, while she was at church.  Stroke code was called, patient was rushed to CT head noncontrast which showed left-sided intraparenchymal basal ganglia/thalamic hemorrhage without intraventricular extension.  Apparently her initial GCS was between 13-15 but then she started getting lethargic, so was intubated for airway protection  Past Medical History  Diabetes mellitus Hypertension CVA  Significant Hospital Events   Admitted to ICU on 11/7  Consults:  Neurology PCCM  Procedures:  ETT 11/7 >> 11/9  Significant Diagnostic Tests:  CT head: 1.3 x 2.7 x 1.5 cm acute parenchymal hemorrhage centered within the left thalamus and basal ganglia. Local mass effect with partial effacement of the third ventricle and 2 mm rightward midline shift at the level of the septum pellucidum. Known chronic pontine lacunar infarct.  MRI brain 11/8> no underlying mass or vascular lesion, chronic microvascular lesions  Micro Data:  11/7 influenza negative 11/7 Covid negative 11/7 MRSA screen negative 11/10 blood culture >> 11/10 respiratory culture  Antimicrobials:    Interim history/subjective:  Much more somnolent today, weak but following commands. Responds to physical stimuli and voice.   Objective   Blood pressure (!) 151/68, pulse (!) 123,  temperature 98.2 F (36.8 C), temperature source Axillary, resp. rate (!) 22, height 5\' 3"  (1.6 m), weight 105.8 kg, SpO2 96 %.    Vent Mode: PSV;CPAP FiO2 (%):  [40 %] 40 % Pressure Support:  [8 cmH20] 8 cmH20   Intake/Output Summary (Last 24 hours) at 01/07/2020 13/11/2019 Last data filed at 01/07/2020 0500 Gross per 24 hour  Intake 1832.6 ml  Output 2025 ml  Net -192.4 ml   Filed Weights   01/04/20 1300  Weight: 105.8 kg    Examination:  General: critically ill appearing elderly woman laying in bed, more somnolent today HENT: [place]/AT, eyes anicteric, oral mucosa moist. Lungs: Bilateral rhonchi, on [place], no wheezing  Cardiovascular: tachycardic, regular rhythm, no murmurs Abdomen: soft, NT, ND Extremities: no LE edema, no clubbing or cyanosis. Neuro: opens eyes to verbal with physical stimuli. Following some commands and responding to some questions with nodding, right sided neglect; no gag reflex and very weak cough Extremities: moves LUE and LLE, no movement RUE, decreased RLE but moves to stimuli Skin: no rashes or wounds.  Resolved Hospital Problem list     Assessment & Plan:   Acute left basal ganglia/thalamic intraparenchymal hemorrhage with slight 2 mm midline shift and ICH score of 2-- stable size on follow up MRI. No underlying mass or vascular lesion to explain her bleed. Echo with EF 55-60%, elevated LVDP. Carotid 13/07/21 with high resistance vertebral arteries. More somnolent today, but no new neurological deficits -cont to hold chemical DVT prophylaxis; avoid AC and AP meds - cont hourly neuro checks -maintain SBP <160- cleviprex PRN, labetalol PRN -neuroprotective measures- elevated HOB  Acute hypoxic and hypercapnic respiratory failure due to hemorrhagic stroke Extubated yesterday. More somnolent today. ABG with compensated respiratory acidosis. No gag  reflex and very weak cough, discussed possibility of trach vs. Comfort care with son and daughter but will see how she  does today. Spiking fevers overnight.  - cont VAP prevention protocol. -cont TF today -respiratory culture, blood culture  -OOB to chair -PT/OT  Hypertension CKD Stage III BP goal <160. Diuresed yesterday with lasix IV and home HCTZ. Creatinine improved with diuresis. Net -1.8L -cont HCTZ, decrease lasix to home dose -stop clonidine and labetalol  -start metop 50 mg bid   Diabetes type 2 Glucose still >200.  -cont SSI resistant, q4h 6U novolog -increase lantus 45U bid to 50U bid -cont TF coverage -goal BG 140-180  Best practice:  Diet: NPO, tube feeds Pain/Anxiety/Delirium protocol (if indicated): Fentanyl/propofol VAP protocol (if indicated): Yes DVT prophylaxis: SCD GI prophylaxis: Protonix Glucose control: SSI Mobility: Bedrest Code Status: Full code Family Communication: daughter and son updated at bedside 11/10 Disposition: ICU  Labs   CBC: Recent Labs  Lab 01/04/20 1307 01/04/20 1307 01/04/20 1614 01/05/20 0612 01/05/20 0919 01/06/20 0512 01/07/20 0116  WBC 10.1  --   --   --   --  15.3* 13.3*  NEUTROABS 6.6  --   --   --   --   --   --   HGB 15.4*  16.7*   < > 15.0 14.6 13.6 13.5 14.3  HCT 48.8*  49.0*   < > 44.0 43.0 40.0 41.6 43.8  MCV 94.0  --   --   --   --  92.4 90.5  PLT PLATELET CLUMPS NOTED ON SMEAR, COUNT APPEARS ADEQUATE  --   --   --   --  209 110*   < > = values in this interval not displayed.    Basic Metabolic Panel: Recent Labs  Lab 01/04/20 1307 01/04/20 1307 01/04/20 1614 01/05/20 0612 01/05/20 0919 01/06/20 0512 01/07/20 0116  NA 140  143   < > 142 139 139 138 143  K 3.9  3.8   < > 3.0* 4.2 3.9 3.9 3.7  CL 103  102  --   --   --   --  100 102  CO2 24  --   --   --   --  27 28  GLUCOSE 139*  138*  --   --   --   --  407* 230*  BUN 31*  34*  --   --   --   --  43* 34*  CREATININE 1.39*  1.50*  --   --   --   --  1.57* 1.25*  CALCIUM 9.6  --   --   --   --  8.7* 8.8*  MG  --   --   --   --   --  1.7 1.6*  PHOS  --    --   --   --   --  3.7 2.5   < > = values in this interval not displayed.   GFR: Estimated Creatinine Clearance: 38.3 mL/min ([name]) (by C-G formula based on SCr of 1.25 mg/dL (H)). Recent Labs  Lab 01/04/20 1307 01/06/20 0512 01/07/20 0116  WBC 10.1 15.3* 13.3*    Liver Function Tests: Recent Labs  Lab 01/04/20 1307  AST 31  ALT 25  ALKPHOS 57  BILITOT 0.5  PROT 7.6  ALBUMIN 3.9   No results for input(s): LIPASE, AMYLASE in the last 168 hours. No results for input(s): AMMONIA in the last 168 hours.  ABG  Component Value Date/Time   PHART 7.414 01/06/2020 1505   PCO2ART 47.6 01/06/2020 1505   PO2ART 83.2 01/06/2020 1505   HCO3 29.9 (H) 01/06/2020 1505   TCO2 31 01/05/2020 0919   O2SAT 96.2 01/06/2020 1505     Coagulation Profile: Recent Labs  Lab 01/04/20 1307  INR 0.9    Cardiac Enzymes: No results for input(s): CKTOTAL, CKMB, CKMBINDEX, TROPONINI in the last 168 hours.  HbA1C: Hgb A1c MFr Bld  Date/Time Value Ref Range Status  01/04/2020 04:01 PM 8.6 (H) 4.8 - 5.6 % Final    Comment:    (NOTE) Pre diabetes:          5.7%-6.4%  Diabetes:              >6.4%  Glycemic control for   <7.0% adults with diabetes     CBG: Recent Labs  Lab 01/06/20 1357 01/06/20 1544 01/06/20 1946 01/06/20 2320 01/07/20 0321  GLUCAP 272* 238* 257* 206* 212*    [name] [name] [name] Mylissa Lambe, DO 01/07/20 6:37 AM [facility] Pager: [phone]

## 2020-01-07 NOTE — Progress Notes (Signed)
Physical Therapy Treatment Patient Details Name: [name] [name] MRN: [id number] DOB: 06-26-1932 Today's Date: 01/07/2020    History of Present Illness The pt is an 86 yo female presenting with acute onset aphasia and R-sided weakness. Imaging revealed L basal ganglia/thalamic hemorrhage with 68mm midline shift and no intraventricular extension. The pt was intubated 11/7. PMH includes: DM II, HTN, CHF, and colon polyps. pt extubated 11/9.    PT Comments    Pt was asleep on arrival after just getting meds.  Throughout the session, stress placed on activation of trunk and extremities with warm up ROM to all 4's, transition to sitting, sitting balance work with movements to stimulate and help arouse and improve activation, sit to stand and stand pivot transfer.  Pt's son participated in the session.    Follow Up Recommendations  CIR     Equipment Recommendations  Other (comment)    Recommendations for Other Services Rehab consult     Precautions / Restrictions Precautions Precautions: Fall Precaution Comments: R-sided hemiparesis.     Mobility  Bed Mobility Overal bed mobility: Needs Assistance Bed Mobility: Supine to Sit;Sit to Supine     Supine to sit: +2 for physical assistance;+2 for safety/equipment;Total assist Sit to supine: +2 for physical assistance;+2 for safety/equipment;Total assist   General bed mobility comments: pt so lethargic that there was little discernable assist.  Transfers Overall transfer level: Needs assistance Equipment used: None Transfers: Sit to/from [employer] Transfers Sit to Stand: Total assist;+2 safety/equipment Stand pivot transfers: Total assist;+2 safety/equipment;+2 physical assistance       General transfer comment: use of pad to help with support at pelvis and to assist upright and forward.  Ambulation/Gait                 Stairs             Wheelchair Mobility    Modified Rankin (Stroke Patients  Only) Modified Rankin (Stroke Patients Only) Pre-Morbid Rankin Score: No symptoms Modified Rankin: Severe disability     Balance Overall balance assessment: Needs assistance   Sitting balance-Leahy Scale: Poor Sitting balance - Comments: little noticeable response.  Sat ~8 min working on attaining responses with startle activity way outside BOS, side sitting, etc.     Standing balance-Leahy Scale: Zero Standing balance comment: minor assist with L LE in stance, but generally low due to lower arousal.                            Cognition Arousal/Alertness: Lethargic Behavior During Therapy: Flat affect Overall Cognitive Status: Impaired/Different from baseline                                 General Comments: delayed command follow today, lethargic but does arouse with activity, mostly nodding head in response to questions with no attempts to vocalize       Exercises Other Exercises Other Exercises: warmup PROM with stretch attempting to achieve activation.    General Comments General comments (skin integrity, edema, etc.): vss Some spontaneous movement L side, not noticeable movement on the R side today, but pt very lethargic      Pertinent Vitals/Pain Pain Assessment: Faces Faces Pain Scale: No hurt    Home Living                      Prior Function  PT Goals (current goals can now be found in the care plan section) Acute Rehab PT Goals PT Goal Formulation: With patient Time For Goal Achievement: 01/19/20 Potential to Achieve Goals: Fair Progress towards PT goals: Not progressing toward goals - comment (low arousal level at time of therapy.)    Frequency    Min 4X/week      PT Plan Current plan remains appropriate    Co-evaluation              AM-PAC PT "6 Clicks" Mobility   Outcome Measure  Help needed turning from your back to your side while in a flat bed without using bedrails?: Total Help needed  moving from lying on your back to sitting on the side of a flat bed without using bedrails?: Total Help needed moving to and from a bed to a chair (including a wheelchair)?: Total Help needed standing up from a chair using your arms (e.g., wheelchair or bedside chair)?: Total Help needed to walk in hospital room?: Total Help needed climbing 3-5 steps with a railing? : Total 6 Click Score: 6    End of Session Equipment Utilized During Treatment: Oxygen Activity Tolerance: Patient tolerated treatment well (low arousal post meds) Patient left: in chair;with call bell/phone within reach;with chair alarm set;with family/visitor present Nurse Communication: Mobility status PT Visit Diagnosis: Other abnormalities of gait and mobility (R26.89);Muscle weakness (generalized) (M62.81)     Time: 8786-7672 PT Time Calculation (min) (ACUTE ONLY): 48 min  Charges:  $Therapeutic Exercise: 8-22 mins $Therapeutic Activity: 8-22 mins $Neuromuscular Re-education: 8-22 mins                     01/07/2020  Jacinto Halim., PT Acute Rehabilitation Services [phone]  (pager) [phone]  (office)   [name] [name] 01/07/2020, 5:03 PM

## 2020-01-07 NOTE — Evaluation (Signed)
Speech Language Pathology Evaluation Patient Details Name: [name] [name] MRN: [id number] DOB: 06-Dec-1932 Today's Date: 01/07/2020 Time: 2725-3664 SLP Time Calculation (min) (ACUTE ONLY): 16 min  Problem List:  Patient Active Problem List   Diagnosis Date Noted  . ICH (intracerebral hemorrhage) (HCC) 01/04/2020  . 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 Left epiretinal membrane 12/10/2019  . Pseudophakia, both eyes 12/10/2019  . DIABETES MELLITUS-TYPE II 06/01/2009  . HYPERLIPIDEMIA 06/01/2009  . HYPERTENSION 06/01/2009  . CHOLELITHIASIS 06/01/2009  . PANCREATITIS 06/01/2009  . PERSONAL HX COLONIC POLYPS 06/01/2009   Past Medical History:  Past Medical History:  Diagnosis Date  . Colon polyp   . Diabetes mellitus 1992  . Hypertension 1996  . Pancreatitis    Past Surgical History:  Past Surgical History:  Procedure Laterality Date  . CHOLECYSTECTOMY    . COLONOSCOPY    . ERCP    . PARTIAL HYSTERECTOMY  86years old  . POLYPECTOMY    . UPPER GASTROINTESTINAL ENDOSCOPY    . WRIST FRACTURE SURGERY  left arm,86years old   HPI:  The pt is an 86 yo female presenting with acute onset aphasia and R-sided weakness. Imaging revealed L basal ganglia/thalamic hemorrhage with 82mm midline shift and no intraventricular extension. The pt was intubated 11/7-11/9. PMH includes: DM II, HTN, CHF, and colon polyps.    Assessment / Plan / Recommendation Clinical Impression  Pt was initially responsive to simple tasks despite keeping her eyes closed, although fatigue quickly during evaluation. She opened her eyes when presented with her daughter over facetime. When more alert she was following one-step commands the majority of the time with Min-Mod cues. She was 90% accurate with simple yes/no questions, falling to 50% accurate with mild increase in complexity. Responses were via head nods as no vocalizations were noted. Pt will need SLP f/u with likely 24/7  supervision.    SLP Assessment  SLP Recommendation/Assessment: (P) Patient needs continued Speech Lanaguage Pathology Services SLP Visit Diagnosis: (P) Cognitive communication deficit (R41.841)    Follow Up Recommendations  (P) Inpatient Rehab    Frequency and Duration (P) min 2x/week  (P) 2 weeks      SLP Evaluation Cognition  Overall Cognitive Status: Impaired/Different from baseline Arousal/Alertness: Lethargic Orientation Level: Oriented to person Attention: Sustained Sustained Attention: Impaired Sustained Attention Impairment: Functional basic Problem Solving: Impaired Problem Solving Impairment: Functional basic       Comprehension  Auditory Comprehension Overall Auditory Comprehension: Impaired Yes/No Questions: Impaired Basic Biographical Questions: 76-100% accurate Basic Immediate Environment Questions: 75-100% accurate Complex Questions: 50-74% accurate Commands: Impaired One Step Basic Commands: 50-74% accurate    Expression Expression Primary Mode of Expression: Verbal Verbal Expression Overall Verbal Expression: Impaired Initiation: Impaired (none elicited) Non-Verbal Means of Communication: Gestures   Oral / Motor  Oral Motor/Sensory Function Overall Oral Motor/Sensory Function: Moderate impairment Facial ROM: Reduced right;Suspected CN VII (facial) dysfunction Facial Symmetry: Abnormal symmetry right;Suspected CN VII (facial) dysfunction Facial Strength: Reduced right;Suspected CN VII (facial) dysfunction Lingual ROM: Reduced right;Reduced left;Other (Comment) (anterior protrusion) Motor Speech Overall Motor Speech:  (UTA)   GO                    Mahala Menghini., M.A. CCC-SLP Acute Rehabilitation Services Pager [phone] Office [phone]  01/07/2020, 2:35 PM

## 2020-01-08 ENCOUNTER — Inpatient Hospital Stay (HOSPITAL_COMMUNITY): Payer: Medicare Other

## 2020-01-08 DIAGNOSIS — I610 Nontraumatic intracerebral hemorrhage in hemisphere, subcortical: Secondary | ICD-10-CM | POA: Diagnosis not present

## 2020-01-08 LAB — BASIC METABOLIC PANEL
Anion gap: 14 (ref 5–15)
BUN: 35 mg/dL — ABNORMAL HIGH (ref 8–23)
CO2: 28 mmol/L (ref 22–32)
Calcium: 9 mg/dL (ref 8.9–10.3)
Chloride: 104 mmol/L (ref 98–111)
Creatinine, Ser: 1.2 mg/dL — ABNORMAL HIGH (ref 0.44–1.00)
GFR, Estimated: 44 mL/min — ABNORMAL LOW (ref >60)
Glucose, Bld: 181 mg/dL — ABNORMAL HIGH (ref 70–99)
Potassium: 4 mmol/L (ref 3.5–5.1)
Sodium: 146 mmol/L — ABNORMAL HIGH (ref 135–145)

## 2020-01-08 LAB — GLUCOSE, CAPILLARY
Glucose-Capillary: 168 mg/dL — ABNORMAL HIGH (ref 70–99)
Glucose-Capillary: 198 mg/dL — ABNORMAL HIGH (ref 70–99)
Glucose-Capillary: 210 mg/dL — ABNORMAL HIGH (ref 70–99)
Glucose-Capillary: 196 mg/dL — ABNORMAL HIGH (ref 70–99)
Glucose-Capillary: 195 mg/dL — ABNORMAL HIGH (ref 70–99)
Glucose-Capillary: 265 mg/dL — ABNORMAL HIGH (ref 70–99)

## 2020-01-08 LAB — CBC
HCT: 44.2 % (ref 36.0–46.0)
Hemoglobin: 14 g/dL (ref 12.0–15.0)
MCH: 29.2 pg (ref 26.0–34.0)
MCHC: 31.7 g/dL (ref 30.0–36.0)
MCV: 92.3 fL (ref 80.0–100.0)
Platelets: 182 10*3/uL (ref 150–400)
RBC: 4.79 MIL/uL (ref 3.87–5.11)
RDW: 14.2 % (ref 11.5–15.5)
WBC: 11.2 10*3/uL — ABNORMAL HIGH (ref 4.0–10.5)
nRBC: 0 % (ref 0.0–0.2)

## 2020-01-08 LAB — MAGNESIUM: Magnesium: 2 mg/dL (ref 1.7–2.4)

## 2020-01-08 LAB — PHOSPHORUS: Phosphorus: 2.3 mg/dL — ABNORMAL LOW (ref 2.5–4.6)

## 2020-01-08 MED ORDER — FUROSEMIDE 20 MG PO TABS
20.0000 mg | ORAL_TABLET | Freq: Every day | ORAL | Status: DC
  Administered 2020-01-08 – 2020-01-16 (×9): 20 mg
  Filled 2020-01-08 (×8): qty 1

## 2020-01-08 MED ORDER — POTASSIUM PHOSPHATES 15 MMOLE/5ML IV SOLN
20.0000 mmol | Freq: Once | INTRAVENOUS | Status: AC
  Administered 2020-01-08: 20 mmol via INTRAVENOUS
  Filled 2020-01-08: qty 6.67

## 2020-01-08 MED ORDER — INSULIN ASPART 100 UNIT/ML ~~LOC~~ SOLN
8.0000 [IU] | SUBCUTANEOUS | Status: DC
  Administered 2020-01-08 – 2020-01-16 (×41): 8 [IU] via SUBCUTANEOUS

## 2020-01-08 MED ORDER — METOPROLOL TARTRATE 50 MG PO TABS
100.0000 mg | ORAL_TABLET | Freq: Two times a day (BID) | ORAL | Status: DC
  Administered 2020-01-08 – 2020-01-16 (×17): 100 mg
  Filled 2020-01-08 (×18): qty 2

## 2020-01-08 MED ORDER — SODIUM CHLORIDE 0.9 % IV SOLN
2.0000 g | Freq: Every day | INTRAVENOUS | Status: DC
  Administered 2020-01-08 – 2020-01-10 (×3): 2 g via INTRAVENOUS
  Filled 2020-01-08 (×3): qty 2

## 2020-01-08 NOTE — Progress Notes (Addendum)
NAME:  [name] [name], MRN:  [id number], DOB:  1932/09/02, LOS: 4 ADMISSION DATE:  01/04/2020, CONSULTATION DATE: 01/04/2020 REFERRING MD:  Gordy Councilman, MD, CHIEF COMPLAINT: Right-sided weakness  Brief History   86 year old female who presented with sudden onset of aphasia and right-sided weakness, noted to have left-sided basal ganglia/thalamic intraparenchymal hemorrhage without intraventricular extension.  She was intubated in the emergency department  History of present illness   86 year old female with diabetes and hypertension was brought in the emergency department with a complaint of sudden onset of right-sided weakness and aphasia, while she was at church.  Stroke code was called, patient was rushed to CT head noncontrast which showed left-sided intraparenchymal basal ganglia/thalamic hemorrhage without intraventricular extension.  Apparently her initial GCS was between 13-15 but then she started getting lethargic, so was intubated for airway protection  Past Medical History  Diabetes mellitus Hypertension CVA  Significant Hospital Events   Admitted to ICU on 11/7  Consults:  Neurology PCCM  Procedures:  ETT 11/7 >> 11/9  Significant Diagnostic Tests:  CT head: 1.3 x 2.7 x 1.5 cm acute parenchymal hemorrhage centered within the left thalamus and basal ganglia. Local mass effect with partial effacement of the third ventricle and 2 mm rightward midline shift at the level of the septum pellucidum. Known chronic pontine lacunar infarct.  MRI brain 11/8> no underlying mass or vascular lesion, chronic microvascular lesions  Micro Data:  11/7 influenza negative 11/7 Covid negative 11/7 MRSA screen negative 11/10 blood culture >> 11/10 respiratory culture  Antimicrobials:    Interim history/subjective:  Slightly more alert today, opening eyes, trying to respond. Cough slightly improved. Following commands. Denies pain  Objective   Blood pressure (!) 164/64, pulse  92, temperature (!) 100.4 F (38 C), temperature source Axillary, resp. rate (!) 24, height 5\' 3"  (1.6 m), weight 105.8 kg, SpO2 98 %.        Intake/Output Summary (Last 24 hours) at 01/08/2020 0707 Last data filed at 01/08/2020 0700 Gross per 24 hour  Intake 488 ml  Output 1450 ml  Net -962 ml   Filed Weights   01/04/20 1300  Weight: 105.8 kg    Examination:  General: critically ill appearing elderly woman laying in bed, arousable to voice HENT: [place]/AT, eyes anicteric, oral mucosa moist. Lungs: Bilateral rhonchi, on [place], no wheezing  Cardiovascular: tachycardic, regular rhythm, no murmurs Abdomen: soft, NT, ND Extremities: no LE edema, no clubbing or cyanosis. Neuro: opens eyes to verbal with physical stimuli. Following some commands and responding to some questions with nodding, right sided neglect; no gag reflex and very weak cough Extremities: moves LUE and LLE, no movement RUE, decreased RLE but moves to stimuli Skin: no rashes or wounds.  Resolved Hospital Problem list     Assessment & Plan:   Acute left basal ganglia/thalamic intraparenchymal hemorrhage with slight 2 mm midline shift and ICH score of 2-- stable size on follow up MRI. No underlying mass or vascular lesion to explain her bleed. Echo with EF 55-60%, elevated LVDP. Carotid 13/07/21 with high resistance vertebral arteries. No new neurological deficits -cont to hold chemical DVT prophylaxis; avoid AC and AP meds - cont hourly neuro checks -maintain SBP <160- cleviprex PRN, labetalol PRN -neuroprotective measures- elevated HOB  Acute hypoxic and hypercapnic respiratory failure due to hemorrhagic stroke Extubated 11/9. High risk for aspiration with very little cough and no gag reflex.  - cont VAP prevention protocol. -cont TF today -respiratory culture pending, gram stain with G+  cocci & rods -start ceftriaxone 5 days -blood cx pending -cont OOB to chair -PT/OT/SLP  Hypertension CKD Stage III BP goal <160.  Diuresed yesterday with lasix IV and home HCTZ. Creatinine improved with diuresis. Net -1.8L -cont HCTZ, lasix 20 mg -stopped clonidine and labetalol  -start metop 50 mg bid - increased to home dose 100 mg bid today  Diabetes type 2 Glucose controlled.  -cont SSI resistant, q4h 6U novolog -cont lantus 50U bid -cont TF coverage -goal BG 140-180  Best practice:  Diet: NPO, tube feeds Pain/Anxiety/Delirium protocol (if indicated): Fentanyl/propofol VAP protocol (if indicated): Yes DVT prophylaxis: SCD GI prophylaxis: Protonix Glucose control: SSI Mobility: Bedrest Code Status: Full code Family Communication: son updated at bedside 11/11 Disposition: ICU  Labs   CBC: Recent Labs  Lab 01/04/20 1307 01/04/20 1614 01/06/20 0512 01/07/20 0116 01/07/20 0718 01/07/20 0814 01/08/20 0250  WBC 10.1  --  15.3* 13.3* 11.5*  --  11.2*  NEUTROABS 6.6  --   --   --   --   --   --   HGB 15.4*  16.7*   < > 13.5 14.3 14.3 13.3 14.0  HCT 48.8*  49.0*   < > 41.6 43.8 44.4 39.0 44.2  MCV 94.0  --  92.4 90.5 91.4  --  92.3  PLT PLATELET CLUMPS NOTED ON SMEAR, COUNT APPEARS ADEQUATE  --  209 110* 184  --  182   < > = values in this interval not displayed.    Basic Metabolic Panel: Recent Labs  Lab 01/04/20 1307 01/04/20 1614 01/06/20 0512 01/07/20 0116 01/07/20 0718 01/07/20 0814 01/08/20 0250  NA 140  143   < > 138 143 143 144 146*  K 3.9  3.8   < > 3.9 3.7 3.3* 3.3* 4.0  CL 103  102  --  100 102 101  --  104  CO2 24  --  27 28 32  --  28  GLUCOSE 139*  138*  --  407* 230* 227*  --  181*  BUN 31*  34*  --  43* 34* 32*  --  35*  CREATININE 1.39*  1.50*  --  1.57* 1.25* 1.26*  --  1.20*  CALCIUM 9.6  --  8.7* 8.8* 9.1  --  9.0  MG  --   --  1.7 1.6* 1.7  --  2.0  PHOS  --   --  3.7 2.5 2.7  --  2.3*   < > = values in this interval not displayed.   GFR: Estimated Creatinine Clearance: 39.9 mL/min (A) (by C-G formula based on SCr of 1.2 mg/dL (H)). Recent Labs  Lab  01/06/20 0512 01/07/20 0116 01/07/20 0718 01/08/20 0250  WBC 15.3* 13.3* 11.5* 11.2*    Liver Function Tests: Recent Labs  Lab 01/04/20 1307  AST 31  ALT 25  ALKPHOS 57  BILITOT 0.5  PROT 7.6  ALBUMIN 3.9   No results for input(s): LIPASE, AMYLASE in the last 168 hours. No results for input(s): AMMONIA in the last 168 hours.  ABG    Component Value Date/Time   PHART 7.438 01/07/2020 0814   PCO2ART 56.6 (H) 01/07/2020 0814   PO2ART 73 (L) 01/07/2020 0814   HCO3 38.4 (H) 01/07/2020 0814   TCO2 40 (H) 01/07/2020 0814   O2SAT 95.0 01/07/2020 0814     Coagulation Profile: Recent Labs  Lab 01/04/20 1307  INR 0.9    Cardiac Enzymes: No results for input(s): CKTOTAL, CKMB,  CKMBINDEX, TROPONINI in the last 168 hours.  HbA1C: Hgb A1c MFr Bld  Date/Time Value Ref Range Status  01/04/2020 04:01 PM 8.6 (H) 4.8 - 5.6 % Final    Comment:    (NOTE) Pre diabetes:          5.7%-6.4%  Diabetes:              >6.4%  Glycemic control for   <7.0% adults with diabetes     CBG: Recent Labs  Lab 01/07/20 1222 01/07/20 1520 01/07/20 1929 01/07/20 2312 01/08/20 0307  GLUCAP 245* 231* 223* 247* 168*    Shelle Galdamez A Falynn Ailey, DO 01/08/20 7:07 AM [facility] Pager: [phone]

## 2020-01-08 NOTE — Progress Notes (Addendum)
Attending:    Subjective: 86 y/o female admitted with left basal ganglia/thalamic intraparenchymal hemorrhage without intraventricular extension. Intubated in ED Extubated, not really protecting airway very well but has made other improvements like moving left hand OK to command Family has decided she will be DNR   Objective: Vitals:   01/08/20 0700 01/08/20 0730 01/08/20 0800 01/08/20 0900  BP: (!) 164/64 (!) 148/66 (!) 162/52 (!) 142/52  Pulse: 92 90 (!) 101 96  Resp: (!) 24 (!) 34 18 20  Temp:   99.8 F (37.7 C)   TempSrc:   Axillary   SpO2: 98% 93% 99% 94%  Weight:      Height:          Intake/Output Summary (Last 24 hours) at 01/08/2020 0935 Last data filed at 01/08/2020 0900 Gross per 24 hour  Intake 228 ml  Output 1850 ml  Net -1622 ml    General:  Resting comfortably in bed HENT: NCAT OP clear PULM: CTA B, normal effort CV: RRR, no mgr GI: BS+, soft, nontender MSK: normal bulk and tone Neuro:drowsy, opens eye to voice, moves left hand spontaneously    CBC    Component Value Date/Time   WBC 11.2 (H) 01/08/2020 0250   RBC 4.79 01/08/2020 0250   HGB 14.0 01/08/2020 0250   HCT 44.2 01/08/2020 0250   PLT 182 01/08/2020 0250   MCV 92.3 01/08/2020 0250   MCH 29.2 01/08/2020 0250   MCHC 31.7 01/08/2020 0250   RDW 14.2 01/08/2020 0250   LYMPHSABS 2.4 01/04/2020 1307   MONOABS 0.9 01/04/2020 1307   EOSABS 0.2 01/04/2020 1307   BASOSABS 0.1 01/04/2020 1307    BMET    Component Value Date/Time   NA 146 (H) 01/08/2020 0250   K 4.0 01/08/2020 0250   CL 104 01/08/2020 0250   CO2 28 01/08/2020 0250   GLUCOSE 181 (H) 01/08/2020 0250   BUN 35 (H) 01/08/2020 0250   CREATININE 1.20 (H) 01/08/2020 0250   CALCIUM 9.0 01/08/2020 0250   GFRNONAA 44 (L) 01/08/2020 0250   GFRAA 39 (L) 06/11/2019 1436    CXR 11/8 images personally reviewed> no pulmonary infiltrate  Impression/Plan:  Left basal ganglia/thalamic intraparenchymal hemorrhage with slight 29mm  midline shift> management per primary service Acute hypoxemic and hypercapnic respiratory failure, not protecting airway, family has elected to not perform intubation or tracheostomy, changed to DNR status Hypertension: HCTZ per tube, starting metoprolol DM2 > blood glucose 140-180. Tracheobronchitis vs pneumonia with fever overnight, GPC in sputum> start ceftriaxone, would plan on 5 days, will order CXR  Updated her son bedside  We will be available prn  My cc time n/a minutes  Heber Warrenton, MD [place] [place] [facility] Pager: [phone] Cell: [phone] After 3pm or if no response, call [phone]

## 2020-01-08 NOTE — Progress Notes (Signed)
Inpatient Rehabilitation-Admissions Coordinator   Per protocol, placed IP Rehab consult order for further assessment.   Cheri Rous, OTR/L  Rehab Admissions Coordinator  [phone] 01/08/2020 3:35 PM

## 2020-01-08 NOTE — Progress Notes (Signed)
Physical Therapy Treatment Patient Details Name: [name] [name] MRN: [id number] DOB: May 18, 1932 Today's Date: 01/08/2020    History of Present Illness The pt is an 86 yo female presenting with acute onset aphasia and R-sided weakness. Imaging revealed L basal ganglia/thalamic hemorrhage with 27mm midline shift and no intraventricular extension. The pt was intubated 11/7. PMH includes: DM II, HTN, CHF, and colon polyps. pt extubated 11/9.    PT Comments    More alert and participative, following simple commands majority of time.  No overt spontaneous movement on the R side.  Emphasis on warm up ROM with graded assist and resistance as appropriate, transitions to sitting, sitting balance, sit to stand and pivot transfer to the chair.  Pt's son, Link Snuffer present and participated in the session.    Follow Up Recommendations  CIR     Equipment Recommendations  Other (comment)    Recommendations for Other Services Rehab consult     Precautions / Restrictions Precautions Precautions: Fall Precaution Comments: R-sided hemiparesis.     Mobility  Bed Mobility Overal bed mobility: Needs Assistance Bed Mobility: Sidelying to Sit;Rolling Rolling: Max assist;Total assist;+2 for physical assistance Sidelying to sit: Total assist;+2 for physical assistance       General bed mobility comments: pt assisted coming into a flexion pattern to roll, but not able to get a good mechanical position to assist up from side.  Transfers Overall transfer level: Needs assistance Equipment used: None Transfers: Sit to/from [employer] Sit to Stand: Total assist;+2 safety/equipment Stand pivot transfers: Total assist;+2 safety/equipment       General transfer comment: use of pad to help with support at pelvis and to assist upright and forward..  Pt assisted with L LE pivot while R LE blocked.  Ambulation/Gait                 Stairs             Wheelchair Mobility     Modified Rankin (Stroke Patients Only) Modified Rankin (Stroke Patients Only) Pre-Morbid Rankin Score: No symptoms Modified Rankin: Severe disability     Balance Overall balance assessment: Needs assistance   Sitting balance-Leahy Scale: Poor Sitting balance - Comments: sat EOB >10 min working on upright posture, moving into/out of BOS, pt able to assist with upright posture, progress from max to min assist and reach with moderate stability assist     Standing balance-Leahy Scale: Zero                              Cognition Arousal/Alertness: Lethargic Behavior During Therapy: Flat affect Overall Cognitive Status: Impaired/Different from baseline                                 General Comments: still lethargic, followed commands the majority of time is given time to process.      Exercises Other Exercises Other Exercises: warmup PROM R side with stretch attempting to achieve activation..  AROM/ with graded assist on L upper and LE    General Comments General comments (skin integrity, edema, etc.): much more mobilie with L side and trace associated reaction of R LE.. No spontaneous movement of R UE.      Pertinent Vitals/Pain Pain Assessment: Faces Faces Pain Scale: No hurt    Home Living  Prior Function            PT Goals (current goals can now be found in the care plan section) Acute Rehab PT Goals PT Goal Formulation: With patient Time For Goal Achievement: 01/19/20 Potential to Achieve Goals: Fair Progress towards PT goals: Progressing toward goals    Frequency    Min 4X/week      PT Plan Current plan remains appropriate    Co-evaluation              AM-PAC PT "6 Clicks" Mobility   Outcome Measure  Help needed turning from your back to your side while in a flat bed without using bedrails?: Total Help needed moving from lying on your back to sitting on the side of a flat bed without  using bedrails?: Total Help needed moving to and from a bed to a chair (including a wheelchair)?: Total Help needed standing up from a chair using your arms (e.g., wheelchair or bedside chair)?: Total Help needed to walk in hospital room?: Total Help needed climbing 3-5 steps with a railing? : Total 6 Click Score: 6    End of Session Equipment Utilized During Treatment: Oxygen Activity Tolerance: Patient tolerated treatment well (low arousal post meds) Patient left: in chair;with call bell/phone within reach;with chair alarm set;with family/visitor present (on sky lift pad) Nurse Communication: Mobility status PT Visit Diagnosis: Other abnormalities of gait and mobility (R26.89);Muscle weakness (generalized) (M62.81)     Time: 1601-0932 PT Time Calculation (min) (ACUTE ONLY): 45 min  Charges:  $Therapeutic Exercise: 8-22 mins $Therapeutic Activity: 8-22 mins $Neuromuscular Re-education: 8-22 mins                     01/08/2020  Jacinto Halim., PT Acute Rehabilitation Services [phone]  (pager) [phone]  (office)   [name] [name] 01/08/2020, 2:21 PM

## 2020-01-08 NOTE — Progress Notes (Signed)
STROKE TEAM PROGRESS NOTE   INTERVAL HISTORY Her son is at the bedside. She is extubated but at high risk for reintubation. CCM following.  She remains lethargic but can be aroused with some difficulty and follows simple commands.  Neurological exam is mostly unchanged.  She has been started on antibiotics for low-grade fever and elevated white count.  Family has made a decision on DNR but wants all aggressive care except intubation and cardiac compression  Vitals:   01/08/20 0700 01/08/20 0730 01/08/20 0800 01/08/20 0900  BP: (!) 164/64 (!) 148/66 (!) 162/52 (!) 142/52  Pulse: 92 90 (!) 101 96  Resp: (!) 24 (!) 34 18 20  Temp:   99.8 F (37.7 C)   TempSrc:   Axillary   SpO2: 98% 93% 99% 94%  Weight:      Height:       CBC:  Recent Labs  Lab 01/04/20 1307 01/04/20 1614 01/07/20 0718 01/07/20 0718 01/07/20 0814 01/08/20 0250  WBC 10.1   < > 11.5*  --   --  11.2*  NEUTROABS 6.6  --   --   --   --   --   HGB 15.4*  16.7*   < > 14.3   < > 13.3 14.0  HCT 48.8*  49.0*   < > 44.4   < > 39.0 44.2  MCV 94.0   < > 91.4  --   --  92.3  PLT PLATELET CLUMPS NOTED ON SMEAR, COUNT APPEARS ADEQUATE   < > 184  --   --  182   < > = values in this interval not displayed.   Basic Metabolic Panel:  Recent Labs  Lab 01/07/20 0718 01/07/20 0718 01/07/20 0814 01/08/20 0250  NA 143   < > 144 146*  K 3.3*   < > 3.3* 4.0  CL 101  --   --  104  CO2 32  --   --  28  GLUCOSE 227*  --   --  181*  BUN 32*  --   --  35*  CREATININE 1.26*  --   --  1.20*  CALCIUM 9.1  --   --  9.0  MG 1.7  --   --  2.0  PHOS 2.7  --   --  2.3*   < > = values in this interval not displayed.   Lipid Panel:  Recent Labs  Lab 01/04/20 1601 01/04/20 1601 01/06/20 0512  CHOL 201*  --   --   TRIG 654*  606*   < > 398*  HDL 37*  --   --   CHOLHDL 5.4  --   --   VLDL UNABLE TO CALCULATE IF TRIGLYCERIDE OVER 400 mg/dL  --   --   LDLCALC UNABLE TO CALCULATE IF TRIGLYCERIDE OVER 400 mg/dL  --   --    < > =  values in this interval not displayed.   HgbA1c:  Recent Labs  Lab 01/04/20 1601  HGBA1C 8.6*   Urine Drug Screen: No results for input(s): LABOPIA, COCAINSCRNUR, LABBENZ, AMPHETMU, THCU, LABBARB in the last 168 hours.  Alcohol Level No results for input(s): ETH in the last 168 hours.  IMAGING past 24 hours No results found.  PHYSICAL EXAM      Patient obese elderly Caucasian lady who appears not to be in distress.. . Afebrile. Head is nontraumatic. Neck is supple without bruit. Cardiac exam no murmur or gallop. Lungs are clear to auscultation. Distal  pulses are well felt. Neurological Exam : Patient is drowsy but awakens easily..  She follows simple commands on the left side and midline but not on the right.  Pupils are 3 to 4 mm reactive.  Left gaze preference but can look partially to the right.  Blinks to threat on the left but not on the right.  Tongue midline right lower facial weakness.  Dense right hemiplegia with only trace withdrawal greater in the right lower and upper extremity.  Purposeful antigravity movements on the left side.   ASSESSMENT/PLAN Ms. Zoua C Angert is a 86 y.o. female with history of diabetes, hypertension, congestive heart failure, colon polyps presenting with aphasia and R sided weakness which progressed to lethargy and emesis w/ assymetric pupils.   Stroke:   L basal ganglia hemorrhage secondary to hypertension  Code Stroke CT head L thalamic and basal ganglia IPH w/ local mass effect and partial effacement 3rd ventricle w/ 42mm midline shift. Small vessel disease. Atrophy. Old pontine lacune. Anterior R frontal lobe meningioma vs calcification.   CT head after intubation unchanged  MRI  No underlying basal ganglia lesion. Extensive small vessel disease.   MRA  Intracranial atherosclerosis.   Carotid Doppler bilateral 1-39% carotid stenosis.  2D Echo ejection fraction 55 to 60%.  LDL UTC as TG > 400 mg/dL, direct LDL 841.6   SAYT0Z 8.6  VTE  prophylaxis - SCDs   aspirin 81 mg daily prior to admission, now on No antithrombotic given hemorrhage   Therapy recommendations:  CIR  Disposition:  pending   Following rounds, family decided on DNR  Acute hypoxic and hypercapnic respiratory failure due to hemorrhagic stroke  Secondary to stroke  Intubated in the ED d/t lethargy  Extubated 10/9  High risk for reintubation  Made DNR  CCM available prn   Tracheobronchitis vs PNA Leukocytosis   WBC 15.3,->13.3->11.5->11.2.    TMax 100.5.   blood Cx NGTD  resp Cx pending   Rocephin 11/11>>  (plan 5 days) - Stop 11/16   Hypertension  Home meds:  catapress 0.2 am + 0.3 pm; lasix 20 (w/ K), metoprolol 200 XL  BP as high as 171/157 on admission  . On Cleviprex . Increase SBP goal < 160 . BP now stable . Off cleviprex d/t risk for sedation  . On metoprolol, HCTZ  . Long-term BP goal normotensive  Hyperlipidemia  Home meds:  pravachol 20  Now on lipitor 80  Will hold statin in setting of acute hemorrhage   LDL UTC d/t TG > 400  mg/dL, Direct LDL 601.0   Resume statin at discharge  Diabetes type II Uncontrolled  Home meds:  70/30 80u am + lunch, 70u hs  HgbA1c 8.6, goal < 7.0  CBGs Recent Labs    01/07/20 2312 01/08/20 0307 01/08/20 0804  GLUCAP 247* 168* 198*      On lantus 50, TF coverage 6->8u q4h   SSI  Dysphagia . Secondary to stroke . NPO . Cortrak w/ TF . Failed MBSS 10/9 . Speech on board   Other Stroke Risk Factors  Advanced Age >/= 73   Morbid Obesity, Body mass index is 41.32 kg/m., BMI >/= 30 associated with increased stroke risk, recommend weight loss, diet and exercise as appropriate   Family hx stroke (brother)  Other Active Problems  CKD stage 3b Cre 1.2  Hospital day # 4  Continue ongoing therapies.  Antibiotics for suspected infection.  Patient is at high risk for respiratory distress but family  has agreed to DNR.  Long discussion with the son at the  bedside and answered questions.  Discussed with Dr. Kendrick Fries.  Greater than 50% time during the 35-minute visit was spent on counseling and coordination of care and discussion with family and care team and answering questions. Delia Heady, MD    To contact Stroke Continuity provider, please refer to [email or web address]. After hours, contact General Neurology

## 2020-01-09 DIAGNOSIS — I610 Nontraumatic intracerebral hemorrhage in hemisphere, subcortical: Secondary | ICD-10-CM | POA: Diagnosis not present

## 2020-01-09 DIAGNOSIS — I5032 Chronic diastolic (congestive) heart failure: Secondary | ICD-10-CM | POA: Diagnosis not present

## 2020-01-09 DIAGNOSIS — R509 Fever, unspecified: Secondary | ICD-10-CM

## 2020-01-09 DIAGNOSIS — I10 Essential (primary) hypertension: Secondary | ICD-10-CM

## 2020-01-09 DIAGNOSIS — E1165 Type 2 diabetes mellitus with hyperglycemia: Secondary | ICD-10-CM

## 2020-01-09 LAB — CBC WITH DIFFERENTIAL/PLATELET
Abs Immature Granulocytes: 0.09 10*3/uL — ABNORMAL HIGH (ref 0.00–0.07)
Basophils Absolute: 0.1 10*3/uL (ref 0.0–0.1)
Basophils Relative: 1 %
Eosinophils Absolute: 0.2 10*3/uL (ref 0.0–0.5)
Eosinophils Relative: 3 %
HCT: 40.6 % (ref 36.0–46.0)
Hemoglobin: 12.9 g/dL (ref 12.0–15.0)
Immature Granulocytes: 1 %
Lymphocytes Relative: 14 %
Lymphs Abs: 1.3 10*3/uL (ref 0.7–4.0)
MCH: 29.9 pg (ref 26.0–34.0)
MCHC: 31.8 g/dL (ref 30.0–36.0)
MCV: 94 fL (ref 80.0–100.0)
Monocytes Absolute: 1.2 10*3/uL — ABNORMAL HIGH (ref 0.1–1.0)
Monocytes Relative: 13 %
Neutro Abs: 6.2 10*3/uL (ref 1.7–7.7)
Neutrophils Relative %: 68 %
Platelets: 192 10*3/uL (ref 150–400)
RBC: 4.32 MIL/uL (ref 3.87–5.11)
RDW: 14.3 % (ref 11.5–15.5)
WBC: 9.1 10*3/uL (ref 4.0–10.5)
nRBC: 0 % (ref 0.0–0.2)

## 2020-01-09 LAB — GLUCOSE, CAPILLARY
Glucose-Capillary: 218 mg/dL — ABNORMAL HIGH (ref 70–99)
Glucose-Capillary: 169 mg/dL — ABNORMAL HIGH (ref 70–99)
Glucose-Capillary: 178 mg/dL — ABNORMAL HIGH (ref 70–99)
Glucose-Capillary: 161 mg/dL — ABNORMAL HIGH (ref 70–99)
Glucose-Capillary: 149 mg/dL — ABNORMAL HIGH (ref 70–99)
Glucose-Capillary: 160 mg/dL — ABNORMAL HIGH (ref 70–99)

## 2020-01-09 LAB — BASIC METABOLIC PANEL
Anion gap: 12 (ref 5–15)
BUN: 40 mg/dL — ABNORMAL HIGH (ref 8–23)
CO2: 34 mmol/L — ABNORMAL HIGH (ref 22–32)
Calcium: 8.8 mg/dL — ABNORMAL LOW (ref 8.9–10.3)
Chloride: 101 mmol/L (ref 98–111)
Creatinine, Ser: 1.37 mg/dL — ABNORMAL HIGH (ref 0.44–1.00)
GFR, Estimated: 38 mL/min — ABNORMAL LOW (ref >60)
Glucose, Bld: 236 mg/dL — ABNORMAL HIGH (ref 70–99)
Potassium: 3.7 mmol/L (ref 3.5–5.1)
Sodium: 147 mmol/L — ABNORMAL HIGH (ref 135–145)

## 2020-01-09 LAB — PHOSPHORUS: Phosphorus: 4 mg/dL (ref 2.5–4.6)

## 2020-01-09 LAB — MAGNESIUM: Magnesium: 1.9 mg/dL (ref 1.7–2.4)

## 2020-01-09 MED ORDER — LOSARTAN POTASSIUM 50 MG PO TABS
50.0000 mg | ORAL_TABLET | Freq: Two times a day (BID) | ORAL | Status: DC
  Administered 2020-01-09: 50 mg via ORAL
  Filled 2020-01-09: qty 1

## 2020-01-09 MED ORDER — FREE WATER
200.0000 mL | Freq: Four times a day (QID) | Status: DC
  Administered 2020-01-09 – 2020-01-16 (×26): 200 mL

## 2020-01-09 MED ORDER — LOSARTAN POTASSIUM 50 MG PO TABS
50.0000 mg | ORAL_TABLET | Freq: Two times a day (BID) | ORAL | Status: DC
  Administered 2020-01-10 – 2020-01-15 (×11): 50 mg
  Filled 2020-01-09 (×12): qty 1

## 2020-01-09 MED ORDER — CHLORHEXIDINE GLUCONATE CLOTH 2 % EX PADS
6.0000 | MEDICATED_PAD | Freq: Every day | CUTANEOUS | Status: DC
  Administered 2020-01-10 – 2020-01-16 (×6): 6 via TOPICAL

## 2020-01-09 NOTE — Plan of Care (Signed)
  Problem: Education: Goal: Knowledge of General Education information will improve Description: Including pain rating scale, medication(s)/side effects and non-pharmacologic comfort measures Outcome: Progressing   Problem: Clinical Measurements: Goal: Ability to maintain clinical measurements within normal limits will improve Outcome: Progressing Goal: Will remain free from infection Outcome: Progressing Goal: Diagnostic test results will improve Outcome: Progressing Goal: Cardiovascular complication will be avoided Outcome: Progressing   Problem: Activity: Goal: Risk for activity intolerance will decrease Outcome: Progressing   Problem: Nutrition: Goal: Adequate nutrition will be maintained Outcome: Progressing   Problem: Coping: Goal: Level of anxiety will decrease Outcome: Progressing   Problem: Elimination: Goal: Will not experience complications related to bowel motility Outcome: Progressing Goal: Will not experience complications related to urinary retention Outcome: Progressing   Problem: Pain Managment: Goal: General experience of comfort will improve Outcome: Progressing   Problem: Safety: Goal: Ability to remain free from injury will improve Outcome: Progressing   Problem: Skin Integrity: Goal: Risk for impaired skin integrity will decrease Outcome: Progressing   Problem: Activity: Goal: Ability to tolerate increased activity will improve Outcome: Progressing   Problem: Respiratory: Goal: Ability to maintain a clear airway and adequate ventilation will improve Outcome: Progressing   Problem: Role Relationship: Goal: Method of communication will improve Outcome: Progressing   Problem: Education: Goal: Knowledge of disease or condition will improve Outcome: Progressing Goal: Knowledge of secondary prevention will improve Outcome: Progressing Goal: Knowledge of patient specific risk factors addressed and post discharge goals established will  improve Outcome: Progressing Goal: Individualized Educational Video(s) Outcome: Progressing   Problem: Self-Care: Goal: Ability to participate in self-care as condition permits will improve Outcome: Progressing Goal: Ability to communicate needs accurately will improve Outcome: Progressing   Problem: Nutrition: Goal: Risk of aspiration will decrease Outcome: Progressing   Problem: Intracerebral Hemorrhage Tissue Perfusion: Goal: Complications of Intracerebral Hemorrhage will be minimized Outcome: Progressing

## 2020-01-09 NOTE — Progress Notes (Signed)
Occupational Therapy Treatment Patient Details Name: [name] [name] MRN: [id number] DOB: 1932/07/15 Today's Date: 01/09/2020    History of present illness The pt is an 86 yo female presenting with acute onset aphasia and R-sided weakness. Imaging revealed L basal ganglia/thalamic hemorrhage with 75mm midline shift and no intraventricular extension. The pt was intubated 11/7. PMH includes: DM II, HTN, CHF, and colon polyps. pt extubated 11/9.   OT comments  This 86 yo female admitted with above presents to acute OT with education completed with son Rocky Link for RUE exercises, visual tracking, and positioning for RUE for edema control. Pt will continue to benefit from acute OT with follow up on CIR.  Follow Up Recommendations  CIR;Supervision/Assistance - 24 hour    Equipment Recommendations  Other (comment) (TBD next venue)    Recommendations for Other Services Rehab consult    Precautions / Restrictions Precautions Precautions: Fall Precaution Comments: R-sided hemiparesis.  Restrictions Weight Bearing Restrictions: No                 Vision   Additional Comments: Worked with pt on visual scanning with son Rocky Link) present. Had pt try and keep her head still as she only tracked with her eyes. Pt had difficulty with this so her son Rocky Link gently placed his hand on top of her head to remind her to not move her head only her eyes. This did help. Pt able to track left, right, up and down with increased time and going slowly with moving the object. When asked if the pen disappeared when she was trying to track it she reported yes. Son will continue to work on this with her adding it to the regimen he is already doing with her right arm and leg.          Cognition Arousal/Alertness: Awake/alert Behavior During Therapy: Flat affect Overall Cognitive Status: Impaired/Different from baseline Area of Impairment: Following commands                       Following Commands: Follows  one step commands with increased time (with vision)       General Comments: Pt did put together that we were close to Thanksgiving when I had her look at the calendar. She was also concerned that she was not going to able to fix fried chicken for her one of her grandchildren as she had promised.        Exercises Other Exercises Other Exercises: I had her son, Rocky Link show me what exercises he had been doing with her right arm. He should me PROM for shoulder horizontal adduction with elbow bent at 90 degrees, elbow flexion/extension, finger flexion/extension. When asked if he was also working on the wrist he said yes, but not for forearm supination/pronation (insturcted him on this with hold for 3 seconds for a stretch for supination). I also showed him how he could do shoulder flexion to 90 degrees as long as Ms. Stallworth's thumb was turned up. He reports he will add supination/pronation and shoulder flexion to his regimen he has been doing with her every 2 hours. We also discussed postioning of her RUE to help wtih edema control.           Pertinent Vitals/ Pain       Pain Assessment: No/denies pain Faces Pain Scale: No hurt         Frequency  Min 2X/week        Progress Toward Goals  OT  Goals(current goals can now be found in the care plan section)  Progress towards OT goals: Progressing toward goals (family (son Rocky Link) reports he has been doing RUE/RLE exercises with his mom every 2 hours when he is here)  Acute Rehab OT Goals Patient Stated Goal: to get out of here OT Goal Formulation: With patient/family Time For Goal Achievement: 01/19/20 Potential to Achieve Goals: Good  Plan Discharge plan remains appropriate       AM-PAC OT "6 Clicks" Daily Activity     Outcome Measure   Help from another person eating meals?: Total Help from another person taking care of personal grooming?: Total Help from another person toileting, which includes using toliet, bedpan, or urinal?:  Total Help from another person bathing (including washing, rinsing, drying)?: Total Help from another person to put on and taking off regular upper body clothing?: Total Help from another person to put on and taking off regular lower body clothing?: Total 6 Click Score: 6    End of Session Equipment Utilized During Treatment: Oxygen  OT Visit Diagnosis: Muscle weakness (generalized) (M62.81);Other abnormalities of gait and mobility (R26.89);Other symptoms and signs involving cognitive function;Hemiplegia and hemiparesis;Low vision, both eyes (H54.2) Hemiplegia - Right/Left: Right Hemiplegia - dominant/non-dominant: Dominant   Activity Tolerance Patient tolerated treatment well   Patient Left in bed;with call bell/phone within reach   Nurse Communication  (son would like his mom to be up in recliner around 11-11:30 and stay up a couple of hours)        Time: 3664-4034 OT Time Calculation (min): 30 min  Charges: OT General Charges $OT Visit: 1 Visit OT Treatments $Therapeutic Exercise: 23-37 mins  Ignacia Palma, OTR/L Acute [employer] Pager [phone] Office [phone]      Evette Georges 01/09/2020, 11:24 AM

## 2020-01-09 NOTE — Consult Note (Signed)
Physical Medicine and Rehabilitation Consult Reason for Consult: Right side weakness and aphasia Referring Physician: Dr. Pearlean BrownieSethi   HPI: [name] [name] is a 86 y.o. right-handed female with history of pancreatitis, obesity with BMI 41.32, diastolic congestive heart failure, diabetes mellitus and hypertension.  History taken from chart review and son due to aphasia.  Patient to return home with son and daughter-in-law who is a retired Engineer, civil (consulting)nurse.  Family plans to hire home caregiver support if necessary.  [name] [name] presented on 01/04/2020 with right hemiparesis and aphasia while at church.  Admission chemistries glucose 139, BUN 31, creatinine 1.39, hemoglobin 15.4, hemoglobin A1c 8.6.  Cranial CT scan showed a 1.3 x 2.7 x 1.5 cm acute parenchymal hemorrhage centered within the left thalamus and basal ganglia.  Local mass-effect with partial effacement of the third ventricle and 2 mm rightward midline shift at the level of the septum pellucidum.  Known chronic pontine lacunar infarct.  Patient did not receive TPA.  Placed on Cleviprex for blood pressure control.  Required intubation initially for airway protection and extubated on 01/06/2020 and followed by pulmonary services felt to be high risk for reintubation.  MRI/MRA revealed no visible mass or vascular lesion underlying the left thalamic hematoma.  Extensive chronic small vessel ischemia.  No major branch occlusion or  limiting stenosis.  Echocardiogram with ejection fraction 55-60%, no wall motion abnormalities grade 2 diastolic dysfunction.  Hospital course further complicated by postop dysphagia.  [name] [name] is currently n.p.o. with alternative means of nutritional support.  Therapy evaluations completed with recommendations of physical medicine rehab consult.  Review of Systems  Unable to perform ROS: Mental acuity   Past Medical History:  Diagnosis Date  . Colon polyp   . Diabetes mellitus 1992  . Hypertension 1996  . Pancreatitis    Past Surgical  History:  Procedure Laterality Date  . CHOLECYSTECTOMY    . COLONOSCOPY    . ERCP    . PARTIAL HYSTERECTOMY  [age]years old  . POLYPECTOMY    . UPPER GASTROINTESTINAL ENDOSCOPY    . WRIST FRACTURE SURGERY  left arm,[age]years old   No pertinent family history of premature CVA on file, unable to obtain from patient Social History:  reports that [name] [name] has never smoked. [name] [name] has never used smokeless tobacco. [name] [name] reports that [name] [name] does not drink alcohol and does not use drugs. Allergies:  Allergies  Allergen Reactions  . Amlodipine Other (See Comments)    EDEMA  . Ace Inhibitors Cough  . Canagliflozin     INVOKANA  . Morphine And Related Rash and Other (See Comments)    For long periods of time (??)   Medications Prior to Admission  Medication Sig Dispense Refill  . acetaminophen (TYLENOL) 500 MG tablet Take 500 mg by mouth every 6 (six) hours as needed for mild pain.    Marland Kitchen. albuterol (PROVENTIL HFA;VENTOLIN HFA) 108 (90 BASE) MCG/ACT inhaler Inhale 2 puffs into the lungs every 6 (six) hours as needed for wheezing or shortness of breath.    Marland Kitchen. aspirin 81 MG chewable tablet Chew 81 mg by mouth daily.     Marland Kitchen. azelastine (ASTELIN) 0.1 % nasal spray Place 1 spray into both nostrils 2 (two) times daily. Use in each nostril as directed    . CALCIUM CARBONATE-VITAMIN D PO Take 1 tablet by mouth 2 (two) times daily.    . chlorthalidone (HYGROTON) 50 MG tablet Take 50 mg by mouth daily.    . cloNIDine (CATAPRES) 0.2 MG  tablet Take 0.2-0.3 mg by mouth See admin instructions. 0.2mg  in am, 0.3mg  in pm    . diclofenac sodium (VOLTAREN) 1 % GEL Apply 4 g topically 4 (four) times daily. (Patient taking differently: Apply 4 g topically 4 (four) times daily as needed (knee pain). ) 100 g 0  . furosemide (LASIX) 20 MG tablet Take 20 mg by mouth See admin instructions. Take 1 tablet (20mg ) by mouth all day except on Sunday    . insulin NPH-regular Human (NOVOLIN 70/30) (70-30) 100 UNIT/ML injection Inject 60-100 Units  into the skin See admin instructions. Check blood sugar in inject insulin as directed: Give 100 units in AM before breakfast, 60-80 units at lunch, and 70 units at dinner    . loratadine (CLARITIN) 10 MG tablet Take 10 mg by mouth daily.    04-17-1969 losartan (COZAAR) 100 MG tablet Take 100 mg by mouth daily.    . metoprolol (TOPROL-XL) 200 MG 24 hr tablet Take 200 mg by mouth daily.    . Potassium Chloride ER 20 MEQ TBCR Take 20 mEq by mouth daily.     . pravastatin (PRAVACHOL) 20 MG tablet Take 20 mg by mouth daily.     . meclizine (ANTIVERT) 12.5 MG tablet Take 1 tablet (12.5 mg total) by mouth 3 (three) times daily as needed for dizziness. (Patient not taking: Reported on 06/11/2019) 30 tablet 0  . ondansetron (ZOFRAN ODT) 4 MG disintegrating tablet Take 1 tablet (4 mg total) by mouth every 8 (eight) hours as needed for nausea or vomiting. (Patient not taking: Reported on 01/05/2020) 20 tablet 0    Home: Home Living Family/patient expects to be discharged to:: Private residence Additional Comments: Pt unable to provide home information at this time, no family present. Will continue to assess.  Functional History: Prior Function Level of Independence: Independent Comments: per RN, family has told her the pt was "in good health" Functional Status:  Mobility: Bed Mobility Overal bed mobility: Needs Assistance Bed Mobility: Sidelying to Sit, Rolling Rolling: Max assist, Total assist, +2 for physical assistance Sidelying to sit: Total assist, +2 for physical assistance Supine to sit: +2 for physical assistance, +2 for safety/equipment, Total assist Sit to supine: +2 for physical assistance, +2 for safety/equipment, Total assist General bed mobility comments: pt assisted coming into a flexion pattern to roll, but not able to get a good mechanical position to assist up from side. Transfers Overall transfer level: Needs assistance Equipment used: None Transfers: Sit to/from Stand, Stand Pivot  Transfers Sit to Stand: Total assist, +2 safety/equipment Stand pivot transfers: Total assist, +2 safety/equipment General transfer comment: use of pad to help with support at pelvis and to assist upright and forward..  Pt assisted with L LE pivot while R LE blocked.      ADL: ADL Overall ADL's : Needs assistance/impaired General ADL Comments: pt currently totalA; hand over hand assist provided for simple face washing task   Cognition: Cognition Overall Cognitive Status: Impaired/Different from baseline Arousal/Alertness: Lethargic Orientation Level: Oriented to person Attention: Sustained Sustained Attention: Impaired Sustained Attention Impairment: Functional basic Problem Solving: Impaired Problem Solving Impairment: Functional basic Cognition Arousal/Alertness: Lethargic Behavior During Therapy: Flat affect Overall Cognitive Status: Impaired/Different from baseline Area of Impairment: Following commands, Awareness Following Commands: Follows one step commands inconsistently, Follows one step commands with increased time Awareness: Intellectual General Comments: still lethargic, followed commands the majority of time is given time to process. Difficult to assess due to: Intubated  Blood pressure (!) 154/70,  pulse 88, temperature 100.2 F (37.9 C), temperature source Axillary, resp. rate (!) 33, height 5\' 3"  (1.6 m), weight 105.8 kg, SpO2 95 %. Physical Exam Vitals reviewed.  Constitutional:      General: [name] [name] is not in acute distress.    Appearance: [name] [name] is obese.  HENT:     Head: Normocephalic and atraumatic.     Comments: +NG    Right Ear: External ear normal.     Left Ear: External ear normal.     Nose: Nose normal.  Eyes:     General:        Right eye: No discharge.        Left eye: No discharge.     Extraocular Movements: Extraocular movements intact.  Cardiovascular:     Rate and Rhythm: Normal rate and regular rhythm.  Pulmonary:     Effort: Pulmonary  effort is normal. No respiratory distress.     Breath sounds: No stridor.     Comments: +Suffern Abdominal:     General: Abdomen is flat. Bowel sounds are normal. There is no distension.  Musculoskeletal:     Cervical back: Normal range of motion and neck supple.     Comments: Right sided edema, no tenderness  Skin:    Comments: Scattered skin tears  Neurological:     Mental Status: [name] [name] is alert.     Comments: Alert Slowed Expressive >> receptive aphasia Follows some one-step commands but examination limited  Sensation appears to be intact to light touch  Psychiatric:     Comments: Limited due to expressive aphasia     Results for orders placed or performed during the hospital encounter of 01/04/20 (from the past 24 hour(s))  Glucose, capillary     Status: Abnormal   Collection Time: 01/08/20  8:04 AM  Result Value Ref Range   Glucose-Capillary 198 (H) 70 - 99 mg/dL  Glucose, capillary     Status: Abnormal   Collection Time: 01/08/20 11:15 AM  Result Value Ref Range   Glucose-Capillary 210 (H) 70 - 99 mg/dL  Glucose, capillary     Status: Abnormal   Collection Time: 01/08/20  3:40 PM  Result Value Ref Range   Glucose-Capillary 196 (H) 70 - 99 mg/dL  Glucose, capillary     Status: Abnormal   Collection Time: 01/08/20  7:32 PM  Result Value Ref Range   Glucose-Capillary 195 (H) 70 - 99 mg/dL  Glucose, capillary     Status: Abnormal   Collection Time: 01/08/20 11:22 PM  Result Value Ref Range   Glucose-Capillary 265 (H) 70 - 99 mg/dL  Magnesium     Status: None   Collection Time: 01/09/20  3:04 AM  Result Value Ref Range   Magnesium 1.9 1.7 - 2.4 mg/dL  Phosphorus     Status: None   Collection Time: 01/09/20  3:04 AM  Result Value Ref Range   Phosphorus 4.0 2.5 - 4.6 mg/dL  CBC with Differential/Platelet     Status: Abnormal   Collection Time: 01/09/20  3:04 AM  Result Value Ref Range   WBC 9.1 4.0 - 10.5 K/uL   RBC 4.32 3.87 - 5.11 MIL/uL   Hemoglobin 12.9 12.0 - 15.0  g/dL   HCT 13/12/21 36 - 46 %   MCV 94.0 80.0 - 100.0 fL   MCH 29.9 26.0 - 34.0 pg   MCHC 31.8 30.0 - 36.0 g/dL   RDW 93.7 90.2 - 40.9 %   Platelets 192 150 - 400 K/uL  nRBC 0.0 0.0 - 0.2 %   Neutrophils Relative % 68 %   Neutro Abs 6.2 1.7 - 7.7 K/uL   Lymphocytes Relative 14 %   Lymphs Abs 1.3 0.7 - 4.0 K/uL   Monocytes Relative 13 %   Monocytes Absolute 1.2 (H) 0.1 - 1.0 K/uL   Eosinophils Relative 3 %   Eosinophils Absolute 0.2 0.0 - 0.5 K/uL   Basophils Relative 1 %   Basophils Absolute 0.1 0.0 - 0.1 K/uL   Immature Granulocytes 1 %   Abs Immature Granulocytes 0.09 (H) 0.00 - 0.07 K/uL  Basic metabolic panel     Status: Abnormal   Collection Time: 01/09/20  3:04 AM  Result Value Ref Range   Sodium 147 (H) 135 - 145 mmol/L   Potassium 3.7 3.5 - 5.1 mmol/L   Chloride 101 98 - 111 mmol/L   CO2 34 (H) 22 - 32 mmol/L   Glucose, Bld 236 (H) 70 - 99 mg/dL   BUN 40 (H) 8 - 23 mg/dL   Creatinine, Ser 1.61 (H) 0.44 - 1.00 mg/dL   Calcium 8.8 (L) 8.9 - 10.3 mg/dL   GFR, Estimated 38 (L) >60 mL/min   Anion gap 12 5 - 15  Glucose, capillary     Status: Abnormal   Collection Time: 01/09/20  3:27 AM  Result Value Ref Range   Glucose-Capillary 218 (H) 70 - 99 mg/dL   DG CHEST PORT 1 VIEW  Result Date: 01/08/2020 CLINICAL DATA:  86 year old female with pneumonia EXAM: PORTABLE CHEST 1 VIEW COMPARISON:  01/05/2020 FINDINGS: Cardiomediastinal silhouette unchanged in size and contour. Interval removal of the endotracheal tube. Interval removal of the gastric tube. Interval placement of enteric feeding tube which projects over the mediastinum and terminates out of the field of view. Reticular opacities the bilateral lungs persist. No pneumothorax. Blunting of the bilateral costophrenic angles. No displaced fracture IMPRESSION: Interval extubation and removal of the gastric tube with placement of enteric feeding tube. Persisting reticular opacities of the lungs, potentially combination of  multifocal infection and/or edema. Likely trace pleural effusions bilaterally. Electronically Signed   By: Gilmer Mor D.O.   On: 01/08/2020 10:01    Assessment/Plan: Diagnosis: Acute parenchymal hemorrhage centered within the left thalamus and basal ganglia with mass-effect Stroke: Continue secondary stroke prophylaxis and Risk Factor Modification listed below:   Blood Pressure Management:  Continue current medication with prn's with permisive HTN per primary team Diabetes management:   Right sided hemiparesis: fit for orthosis to prevent contractures (resting hand splint for day, wrist cock up splint at night, PRAFO, etc) PT/OT for mobility, ADL training  Motor recovery: Fluoxetine Labs independently reviewed.  Records reviewed and summated above.  1. Does the need for close, 24 hr/day medical supervision in concert with the patient's rehab needs make it unreasonable for this patient to be served in a less intensive setting? Yes 2. Co-Morbidities requiring supervision/potential complications: pancreatitis, morbid obesity (encourage weight loss), diastolic congestive heart failure (monitor for signs and symptoms of fluid overload), uncontrolled diabetes mellitus with hyperglycemia (Monitor in accordance with exercise and adjust meds as necessary), HTN (monitor and provide prns in accordance with increased physical exertion and pain), Fevers (repeat labs, cont to monitor for signs and symptoms of infection, further workup if indicated) 3. Due to bladder management, bowel management, safety, skin/wound care, disease management, medication administration, pain management and patient education, does the patient require 24 hr/day rehab nursing? Yes 4. Does the patient require coordinated care of a physician,  rehab nurse, therapy disciplines of PT/OT/SLP to address physical and functional deficits in the context of the above medical diagnosis(es)? Yes Addressing deficits in the following areas:  balance, endurance, locomotion, strength, transferring, bathing, dressing, toileting, cognition, speech, language, swallowing and psychosocial support 5. Can the patient actively participate in an intensive therapy program of at least 3 hrs of therapy per day at least 5 days per week? Potentially 6. The potential for patient to make measurable gains while on inpatient rehab is excellent 7. Anticipated functional outcomes upon discharge from inpatient rehab are min assist  with PT, min assist with OT, modified independent and supervision with SLP. 8. Estimated rehab length of stay to reach the above functional goals is: 26-30 days. 9. Anticipated discharge destination: Home 10. Overall Rehab/Functional Prognosis: good  RECOMMENDATIONS: This patient's condition is appropriate for continued rehabilitative care in the following setting: CIR medically stable. Patient has agreed to participate in recommended program. Potentially Note that insurance prior authorization may be required for reimbursement for recommended care.  Comment: Rehab Admissions Coordinator to follow up.  I have personally performed a face to face diagnostic evaluation, including, but not limited to relevant history and physical exam findings, of this patient and developed relevant assessment and plan.  Additionally, I have reviewed and concur with the physician assistant's documentation above.   Maryla Morrow, MD, ABPMR Mcarthur Rossetti Angiulli, PA-C 01/09/2020

## 2020-01-09 NOTE — Progress Notes (Signed)
  Speech Language Pathology Treatment: Dysphagia;Cognitive-Linquistic  Patient Details Name: [name] [name] MRN: [id number] DOB: 07/29/1932 Today's Date: 01/09/2020 Time: 4920-1007 SLP Time Calculation (min) (ACUTE ONLY): 30 min  Assessment / Plan / Recommendation Clinical Impression  Pt was seen for skilled ST targeting cognitive-linguistic and swallowing goals.  Pt was initially very lethargic upon therapist's arrival and could only keep her eyes open briefly before closing them.  Pt did appear to awaken following oral care though and exhibited no overt s/s of aspiration with teaspoons of thin liquids.  Pt did have anterior spillage of ~25-50% of teaspoon sized boluses due to oral motor weakness.  Cup sips resulted in multiple audible swallows which SLP suspects to be related to decreased oral control of boluses.  Discussed with pt's son that I would strongly recommend an instrumental assessment of pt's swallowing function prior to initiation of PO diet given the extent of pt's neuro deficits.  Pt was much more communicative today in comparison to initial evaluation and was able to name objects from around the room with min-mod assist semantic and sentence completion cues.  Pt was also able to complete receptive naming tasks (how many things come in a dozen, where do you go to buy medicine, what do you use in the shower, etc) with mod assist verbal cues.  Pt was left in chair with son at bedside.  Continue per current plan of care.    HPI HPI: The pt is an 86 yo female presenting with acute onset aphasia and R-sided weakness. Imaging revealed L basal ganglia/thalamic hemorrhage with 55mm midline shift and no intraventricular extension. The pt was intubated 11/7-11/9. PMH includes: DM II, HTN, CHF, and colon polyps.       SLP Plan  Continue with current plan of care       Recommendations  Diet recommendations: NPO Medication Administration: Via alternative means                Oral Care  Recommendations: Oral care QID Follow up Recommendations: Inpatient Rehab SLP Visit Diagnosis: Cognitive communication deficit (H21.975) Plan: Continue with current plan of care       GO                Gracie Gupta, Melanee Spry 01/09/2020, 2:10 PM

## 2020-01-09 NOTE — Progress Notes (Signed)
STROKE TEAM PROGRESS NOTE   INTERVAL HISTORY Her son is at the bedside. She is mor alert today. Neurological exam mostly unchanged. She on antibiotics and wbcs are decreased today.  Family has made a decision on DNR but wants all aggressive care except intubation and cardiac compression  Vitals:   01/09/20 0900 01/09/20 1000 01/09/20 1100 01/09/20 1200  BP: (!) 155/54 (!) 161/50 (!) 150/66 (!) 159/52  Pulse: 82 71 80 67  Resp: 17 16 16 16   Temp:    98.7 F (37.1 C)  TempSrc:    Oral  SpO2: 97% 98% 98% 98%  Weight:      Height:       CBC:  Recent Labs  Lab 01/04/20 1307 01/04/20 1614 01/08/20 0250 01/09/20 0304  WBC 10.1   < > 11.2* 9.1  NEUTROABS 6.6  --   --  6.2  HGB 15.4*  16.7*   < > 14.0 12.9  HCT 48.8*  49.0*   < > 44.2 40.6  MCV 94.0   < > 92.3 94.0  PLT PLATELET CLUMPS NOTED ON SMEAR, COUNT APPEARS ADEQUATE   < > 182 192   < > = values in this interval not displayed.   Basic Metabolic Panel:  Recent Labs  Lab 01/08/20 0250 01/09/20 0304  NA 146* 147*  K 4.0 3.7  CL 104 101  CO2 28 34*  GLUCOSE 181* 236*  BUN 35* 40*  CREATININE 1.20* 1.37*  CALCIUM 9.0 8.8*  MG 2.0 1.9  PHOS 2.3* 4.0   Lipid Panel:  Recent Labs  Lab 01/04/20 1601 01/04/20 1601 01/06/20 0512  CHOL 201*  --   --   TRIG 654*  606*   < > 398*  HDL 37*  --   --   CHOLHDL 5.4  --   --   VLDL UNABLE TO CALCULATE IF TRIGLYCERIDE OVER 400 mg/dL  --   --   LDLCALC UNABLE TO CALCULATE IF TRIGLYCERIDE OVER 400 mg/dL  --   --    < > = values in this interval not displayed.   HgbA1c:  Recent Labs  Lab 01/04/20 1601  HGBA1C 8.6*   Urine Drug Screen: No results for input(s): LABOPIA, COCAINSCRNUR, LABBENZ, AMPHETMU, THCU, LABBARB in the last 168 hours.  Alcohol Level No results for input(s): ETH in the last 168 hours.  IMAGING past 24 hours No results found.  PHYSICAL EXAM      Patient obese elderly Caucasian lady who appears not to be in distress.. . Afebrile. Head is  nontraumatic. Neck is supple without bruit. Cardiac exam no murmur or gallop. Lungs are clear to auscultation. Distal pulses are well felt. Neurological Exam : Patient is awake, she states her name but nods no to what day or month.  She follows simple commands on the left side and midline but not on the right.  Pupils are 3 to 4 mm reactive.  Left gaze preference but can look to the right at her son and acknowledge him.  Blinks to threat on the left but inconsistent on the right.  Tongue midline right lower facial weakness.  Dense right hemiplegia with only trace withdrawal greater in the right lower and upper extremity.  Purposeful antigravity movements on the left side.   ASSESSMENT/PLAN Ms. Kaori C Grajeda is a 86 y.o. female with history of diabetes, hypertension, congestive heart failure, colon polyps presenting with aphasia and R sided weakness which progressed to lethargy and emesis w/ assymetric pupils.  Stroke:   L basal ganglia hemorrhage secondary to hypertension  Code Stroke CT head L thalamic and basal ganglia IPH w/ local mass effect and partial effacement 3rd ventricle w/ 77mm midline shift. Small vessel disease. Atrophy. Old pontine lacune. Anterior R frontal lobe meningioma vs calcification.   CT head after intubation unchanged  MRI  No underlying basal ganglia lesion. Extensive small vessel disease.   MRA  Intracranial atherosclerosis.   Carotid Doppler bilateral 1-39% carotid stenosis.  2D Echo ejection fraction 55 to 60%.  LDL UTC as TG > 400 mg/dL, direct LDL 161.0   [id number] 8.6  VTE prophylaxis - SCDs   aspirin 81 mg daily prior to admission, now on No antithrombotic given hemorrhage. Can restart ASA after bleed resolution, will need outpatient follow up for repeat imaging.  Therapy recommendations:  CIR  Disposition:  pending   Following rounds, family decided on DNR  Acute hypoxic and hypercapnic respiratory failure due to hemorrhagic stroke  Secondary to  stroke  Intubated in the ED d/t lethargy  Extubated 10/9  High risk for reintubation  Made DNR  CCM available prn   Tracheobronchitis vs PNA Leukocytosis   WBC 15.3,->13.3->11.5->11.2->9.1  TMax 100.5.   blood Cx NGTD  resp Cx pending   Rocephin 11/11>>  (plan 5 days) - Stop 11/16   Hypertension  Home meds:  catapress 0.2 am + 0.3 pm; lasix 20 (w/ K), metoprolol 200 XL  BP as high as 171/157 on admission  . On Cleviprex . Increase SBP goal < 160 . BP now stable . Off cleviprex d/t risk for sedation  . On metoprolol, HCTZ  . Long-term BP goal normotensive  Hyperlipidemia  Home meds:  pravachol 20  Now on lipitor 80  Will hold statin in setting of acute hemorrhage   LDL UTC d/t TG > 400  mg/dL, Direct LDL 098.1   Resume statin at discharge  Diabetes type II Uncontrolled  Home meds:  70/30 80u am + lunch, 70u hs  HgbA1c 8.6, goal < 7.0  CBGs Recent Labs    01/09/20 0327 01/09/20 0800 01/09/20 1158  GLUCAP 218* 169* 178*      On lantus 50, TF coverage 6->8u q4h   SSI  Dysphagia . Secondary to stroke . NPO . Cortrak w/ TF . Failed MBSS 10/9 . Speech on board   Other Stroke Risk Factors  Advanced Age >/= 95   Morbid Obesity, Body mass index is 41.32 kg/m., BMI >/= 30 associated with increased stroke risk, recommend weight loss, diet and exercise as appropriate   Family hx stroke (brother)  Other Active Problems  CKD stage 3b Cre 1.2  Hospital day # 5  Continue ongoing therapies.  Antibiotics for suspected infection.  Patient is at high risk for respiratory distress but family has agreed to DNR.  Discussion with the son at the bedside and answered questions.   Personally examined patient and images, and have participated in and made any corrections needed to history, physical, neuro exam,assessment and plan as stated above.  I have personally obtained the history, evaluated lab date, reviewed imaging studies and agree with  radiology interpretations.    Naomie Dean, MD Stroke Neurology  I spent 35 minutes of face-to-face and non-face-to-face time with patient. This included prechart review, lab review, study review, order entry, electronic health record documentation, patient education on the different diagnostic and therapeutic options, counseling and coordination of care, risks and benefits of management, compliance, or risk factor reduction  To contact Stroke Continuity provider, please refer to [email or web address] After hours, contact General Neurology

## 2020-01-10 DIAGNOSIS — E7800 Pure hypercholesterolemia, unspecified: Secondary | ICD-10-CM

## 2020-01-10 DIAGNOSIS — N183 Chronic kidney disease, stage 3 unspecified: Secondary | ICD-10-CM | POA: Diagnosis present

## 2020-01-10 DIAGNOSIS — N1831 Chronic kidney disease, stage 3a: Secondary | ICD-10-CM

## 2020-01-10 DIAGNOSIS — I610 Nontraumatic intracerebral hemorrhage in hemisphere, subcortical: Secondary | ICD-10-CM | POA: Diagnosis not present

## 2020-01-10 DIAGNOSIS — I611 Nontraumatic intracerebral hemorrhage in hemisphere, cortical: Secondary | ICD-10-CM

## 2020-01-10 DIAGNOSIS — I10 Essential (primary) hypertension: Secondary | ICD-10-CM | POA: Diagnosis not present

## 2020-01-10 LAB — BASIC METABOLIC PANEL
Anion gap: 10 (ref 5–15)
BUN: 41 mg/dL — ABNORMAL HIGH (ref 8–23)
CO2: 34 mmol/L — ABNORMAL HIGH (ref 22–32)
Calcium: 8.4 mg/dL — ABNORMAL LOW (ref 8.9–10.3)
Chloride: 100 mmol/L (ref 98–111)
Creatinine, Ser: 1.22 mg/dL — ABNORMAL HIGH (ref 0.44–1.00)
GFR, Estimated: 43 mL/min — ABNORMAL LOW (ref >60)
Glucose, Bld: 123 mg/dL — ABNORMAL HIGH (ref 70–99)
Potassium: 3.7 mmol/L (ref 3.5–5.1)
Sodium: 144 mmol/L (ref 135–145)

## 2020-01-10 LAB — URINALYSIS, COMPLETE (UACMP) WITH MICROSCOPIC
Bilirubin Urine: NEGATIVE
Glucose, UA: NEGATIVE mg/dL
Hgb urine dipstick: NEGATIVE
Ketones, ur: NEGATIVE mg/dL
Leukocytes,Ua: NEGATIVE
Nitrite: NEGATIVE
Protein, ur: 100 mg/dL — AB
Specific Gravity, Urine: 1.02 (ref 1.005–1.030)
pH: 7 (ref 5.0–8.0)

## 2020-01-10 LAB — CBC
HCT: 41.4 % (ref 36.0–46.0)
Hemoglobin: 13 g/dL (ref 12.0–15.0)
MCH: 29.5 pg (ref 26.0–34.0)
MCHC: 31.4 g/dL (ref 30.0–36.0)
MCV: 93.9 fL (ref 80.0–100.0)
Platelets: 211 10*3/uL (ref 150–400)
RBC: 4.41 MIL/uL (ref 3.87–5.11)
RDW: 14.2 % (ref 11.5–15.5)
WBC: 9.7 10*3/uL (ref 4.0–10.5)
nRBC: 0 % (ref 0.0–0.2)

## 2020-01-10 LAB — GLUCOSE, CAPILLARY
Glucose-Capillary: 106 mg/dL — ABNORMAL HIGH (ref 70–99)
Glucose-Capillary: 107 mg/dL — ABNORMAL HIGH (ref 70–99)
Glucose-Capillary: 115 mg/dL — ABNORMAL HIGH (ref 70–99)
Glucose-Capillary: 117 mg/dL — ABNORMAL HIGH (ref 70–99)
Glucose-Capillary: 129 mg/dL — ABNORMAL HIGH (ref 70–99)
Glucose-Capillary: 161 mg/dL — ABNORMAL HIGH (ref 70–99)

## 2020-01-10 LAB — CULTURE, RESPIRATORY W GRAM STAIN: Culture: NORMAL

## 2020-01-10 MED ORDER — HEPARIN SODIUM (PORCINE) 5000 UNIT/ML IJ SOLN
5000.0000 [IU] | Freq: Three times a day (TID) | INTRAMUSCULAR | Status: DC
  Administered 2020-01-10 – 2020-01-16 (×16): 5000 [IU] via SUBCUTANEOUS
  Filled 2020-01-10 (×17): qty 1

## 2020-01-10 MED ORDER — SODIUM CHLORIDE 0.9 % IV SOLN
2.0000 g | Freq: Every day | INTRAVENOUS | Status: AC
  Administered 2020-01-11 – 2020-01-12 (×2): 2 g via INTRAVENOUS
  Filled 2020-01-10: qty 2
  Filled 2020-01-10 (×2): qty 20

## 2020-01-10 MED ORDER — LABETALOL HCL 5 MG/ML IV SOLN
10.0000 mg | INTRAVENOUS | Status: DC | PRN
  Administered 2020-01-11: 10 mg via INTRAVENOUS
  Filled 2020-01-10: qty 4

## 2020-01-10 MED ORDER — HYDRALAZINE HCL 50 MG PO TABS
50.0000 mg | ORAL_TABLET | Freq: Three times a day (TID) | ORAL | Status: DC
  Administered 2020-01-10 – 2020-01-11 (×2): 50 mg
  Filled 2020-01-10 (×3): qty 1

## 2020-01-10 NOTE — Progress Notes (Signed)
PROGRESS NOTE    [name] [name]  ZOX:[id number] DOB: Aug 14, 1932 DOA: 01/04/2020 PCP: Tracey HarriesBouska, David, MD     Brief Narrative:  86 yo WF PMHx DM type II uncontrolled with complication, DM retinopathy both eyes, Chronic Diastolic CHF, HTN, HLD, CKD stage IIIb, pancreatitis, Hx colonic polyps  with a chief complaint of acute onset aphasia and right-sided weakness.  This started while she was at church and occurred less than an hour ago.  Patient arrived as a code stroke.  Level 5 caveat acuity of condition.   Subjective: Afebrile overnight A/O x4, patient joking with medical staff and her daughter   Assessment & Plan: Covid vaccination; vaccinated   Active Problems:   ICH (intracerebral hemorrhage) (HCC)   Morbid obesity (HCC)   Chronic diastolic congestive heart failure (HCC)   Uncontrolled type 2 diabetes mellitus with hyperglycemia (HCC)   Essential hypertension   FUO (fever of unknown origin)   CKD (chronic kidney disease), stage III (HCC)   LEFT basal ganglia hemorrhagic CVA -Secondary to HTN -Code Stroke CT head L thalamic and basal ganglia IPH w/ local mass effect and partial effacement 3rd ventricle w/ 2mm midline shift. Small vessel disease. Atrophy. Old pontine lacune. Anterior R frontal lobe meningioma vs calcification.  -CT head after intubation unchanged -MRI  No underlying basal ganglia lesion. Extensive small vessel disease.  -MRA  Intracranial atherosclerosis.  -Carotid Doppler bilateral 1-39% carotid stenosis. -2D Echo ejection fraction 55 to 60%. -LDL UTC as TG > 400 mg/dL, direct LDL [id number]  -[id number] 8.6 -ASA 81 mg daily -CIR recommended  Acute respiratory failure with hypoxia and hypercapnia -Secondary stroke -Intubated in ED>>>> 10/9 -High risk for reintubation  Tracheobronchitis vs PNA -Leukocytosis resolved -Afebrile overnight  -Neurology recommended completion of 5-day course of ceftriaxone 11/11>>> 11/16  Essential HTN -On admission BP  171/157 -Cleviprex drip -Lasix 20 mg daily -Labetalol PRN -Losartan 50 mg BID -Metoprolol 100 mg BID  HLD -Lipitor 80 mg daily (hold) in setting of acute hemorrhage. -11/7 TG= 654.  LDL =UTC, direct LDL= 121.8 -Resume statin at discharge  DM type II uncontrolled with Hyperglycemia -11/7 hemoglobin A1c= 8.6 -Lantus 50 units BID -NovoLog 8 units q 4 hr -Resistant SSI -11/13 consult diabetic coordinator; uncontrolled diabetic with stroke -11/13 consult nutrition; uncontrolled diabetic with stroke.  Educate on diet  Dysphagia -Continue tube feeds per core track  Morbidly obese -See diabetes  CKD stage IIIb (baseline Cr 1.2)  Lab Results  Component Value Date   CREATININE 1.22 (H) 01/10/2020   CREATININE 1.37 (H) 01/09/2020   CREATININE 1.20 (H) 01/08/2020   CREATININE 1.26 (H) 01/07/2020   CREATININE 1.25 (H) 01/07/2020  -At baseline    DVT prophylaxis: SCD Code Status: DNR Family Communication: 11/13 daughter at bedside discussed plan of care answered all questions Status is: Inpatient    Dispo: The patient is from: Home              Anticipated d/c is to: SNF?              Anticipated d/c date is: Per neurology              Patient currently unstable      Consultants:  Neurology PCCM    Procedures/Significant Events:  11/8 echocardiogram;FINDINGS  Left Ventricle: Left ventricular ejection fraction, by estimation, is 55  to 60%. The left ventricle has normal function. The left ventricle has no  regional wall motion abnormalities. Definity contrast agent was given IV  to  delineate the left ventricular  endocardial borders. The left ventricular internal cavity size was normal  in size. There is no left ventricular hypertrophy. Left ventricular  diastolic parameters are consistent with Grade II diastolic dysfunction  (pseudonormalization). Elevated left  ventricular end-diastolic pressure.   Right Ventricle: The right ventricular size is normal. No  increase in  right ventricular wall thickness. Right ventricular systolic function is  normal.   Left Atrium: Left atrial size was mildly dilated.   Right Atrium: Right atrial size was normal in size.   Pericardium: There is no evidence of pericardial effusion.   Mitral Valve: The mitral valve is degenerative in appearance. There is  mild thickening of the mitral valve leaflet(s). There is mild  calcification of the mitral valve leaflet(s). Moderate mitral annular  calcification. Trivial mitral valve regurgitation.  No evidence of mitral valve stenosis.   Tricuspid Valve: The tricuspid valve is normal in structure. Tricuspid  valve regurgitation is not demonstrated. No evidence of tricuspid  stenosis.   Aortic Valve: The aortic valve is tricuspid. Aortic valve regurgitation is  not visualized. Mild aortic valve sclerosis is present, with no evidence  of aortic valve stenosis.   Pulmonic Valve: The pulmonic valve was normal in structure. Pulmonic valve  regurgitation is not visualized. No evidence of pulmonic stenosis.   Aorta: The aortic root is normal in size and structure.   Venous: The inferior vena cava is normal in size with greater than 50%  respiratory variability, suggesting right atrial pressure of 3 mmHg.   IAS/Shunts: No atrial level shunt detected by color flow Doppler.   I have personally reviewed and interpreted all radiology studies and my findings are as above.  VENTILATOR SETTINGS:    Cultures   Antimicrobials: Anti-infectives (From admission, onward)   Start     Ordered Stop   01/11/20 1000  cefTRIAXone (ROCEPHIN) 2 g in sodium chloride 0.9 % 100 mL IVPB        01/10/20 1128 01/13/20 0959   01/08/20 0900  cefTRIAXone (ROCEPHIN) 2 g in sodium chloride 0.9 % 100 mL IVPB  Status:  Discontinued        01/08/20 0801 01/10/20 1128       Devices    LINES / TUBES:      Continuous Infusions: . cefTRIAXone (ROCEPHIN)  IV 2 g (01/10/20 1056)  .  clevidipine Stopped (01/08/20 1139)  . feeding supplement (GLUCERNA 1.2 CAL) 60 mL/hr at 01/10/20 0700     Objective: Vitals:   01/10/20 0700 01/10/20 0800 01/10/20 0900 01/10/20 1000  BP: (!) 126/46 (!) 164/109 (!) 171/44 (!) 170/50  Pulse: 80 74 62 64  Resp: 18 17 16 15   Temp:  98 F (36.7 C)    TempSrc:  Oral    SpO2: 98% 96% 99% 99%  Weight:      Height:        Intake/Output Summary (Last 24 hours) at 01/10/2020 1106 Last data filed at 01/10/2020 0800 Gross per 24 hour  Intake 1455 ml  Output 1354 ml  Net 101 ml   Filed Weights   01/04/20 1300 01/09/20 0426 01/10/20 0500  Weight: 105.8 kg 105.8 kg 104 kg    Examination:  General: A/O x4, No acute respiratory distress Eyes: negative scleral hemorrhage, negative anisocoria, negative icterus ENT: Negative Runny nose, negative gingival bleeding, Neck:  Negative scars, masses, torticollis, lymphadenopathy, JVD Lungs: Clear to auscultation bilaterally without wheezes or crackles Cardiovascular: Regular rate and rhythm without murmur gallop or rub  normal S1 and S2 Abdomen: MORBIDLY OBESE, negative abdominal pain, nondistended, positive soft, bowel sounds, no rebound, no ascites, no appreciable mass Extremities: No significant cyanosis, clubbing, or edema bilateral lower extremities Skin: Negative rashes, lesions, ulcers Psychiatric:  Negative depression, negative anxiety, negative fatigue, negative mania  Central nervous system:  Cranial nerves II through XII intact, tongue/uvula midline, LUE/LLE muscle strength 5/5, sensation intact throughout, RIGHT sided hemiparesis,  negative dysarthria, negative expressive aphasia, negative receptive aphasia.  .     Data Reviewed: Care during the described time interval was provided by me .  I have reviewed this patient's available data, including medical history, events of note, physical examination, and all test results as part of my evaluation.  CBC: Recent Labs  Lab  01/04/20 1307 01/04/20 1614 01/07/20 0116 01/07/20 0116 01/07/20 0718 01/07/20 0814 01/08/20 0250 01/09/20 0304 01/10/20 0617  WBC 10.1   < > 13.3*  --  11.5*  --  11.2* 9.1 9.7  NEUTROABS 6.6  --   --   --   --   --   --  6.2  --   HGB 15.4*  16.7*   < > 14.3   < > 14.3 13.3 14.0 12.9 13.0  HCT 48.8*  49.0*   < > 43.8   < > 44.4 39.0 44.2 40.6 41.4  MCV 94.0   < > 90.5  --  91.4  --  92.3 94.0 93.9  PLT PLATELET CLUMPS NOTED ON SMEAR, COUNT APPEARS ADEQUATE   < > 110*  --  184  --  182 192 211   < > = values in this interval not displayed.   Basic Metabolic Panel: Recent Labs  Lab 01/06/20 0512 01/06/20 0512 01/07/20 0116 01/07/20 0116 01/07/20 0718 01/07/20 0814 01/08/20 0250 01/09/20 0304 01/10/20 0617  NA 138   < > 143   < > 143 144 146* 147* 144  K 3.9   < > 3.7   < > 3.3* 3.3* 4.0 3.7 3.7  CL 100   < > 102  --  101  --  104 101 100  CO2 27   < > 28  --  32  --  28 34* 34*  GLUCOSE 407*   < > 230*  --  227*  --  181* 236* 123*  BUN 43*   < > 34*  --  32*  --  35* 40* 41*  CREATININE 1.57*   < > 1.25*  --  1.26*  --  1.20* 1.37* 1.22*  CALCIUM 8.7*   < > 8.8*  --  9.1  --  9.0 8.8* 8.4*  MG 1.7  --  1.6*  --  1.7  --  2.0 1.9  --   PHOS 3.7  --  2.5  --  2.7  --  2.3* 4.0  --    < > = values in this interval not displayed.   GFR: Estimated Creatinine Clearance: 38.9 mL/min (A) (by C-G formula based on SCr of 1.22 mg/dL (H)). Liver Function Tests: Recent Labs  Lab 01/04/20 1307  AST 31  ALT 25  ALKPHOS 57  BILITOT 0.5  PROT 7.6  ALBUMIN 3.9   No results for input(s): LIPASE, AMYLASE in the last 168 hours. No results for input(s): AMMONIA in the last 168 hours. Coagulation Profile: Recent Labs  Lab 01/04/20 1307  INR 0.9   Cardiac Enzymes: No results for input(s): CKTOTAL, CKMB, CKMBINDEX, TROPONINI in the last 168 hours. BNP (last  3 results) No results for input(s): PROBNP in the last 8760 hours. HbA1C: No results for input(s): HGBA1C in the  last 72 hours. CBG: Recent Labs  Lab 01/09/20 1524 01/09/20 1922 01/09/20 2318 01/10/20 0320 01/10/20 0807  GLUCAP 161* 149* 160* 106* 107*   Lipid Profile: No results for input(s): CHOL, HDL, LDLCALC, TRIG, CHOLHDL, LDLDIRECT in the last 72 hours. Thyroid Function Tests: No results for input(s): TSH, T4TOTAL, FREET4, T3FREE, THYROIDAB in the last 72 hours. Anemia Panel: No results for input(s): VITAMINB12, FOLATE, FERRITIN, TIBC, IRON, RETICCTPCT in the last 72 hours. Sepsis Labs: No results for input(s): PROCALCITON, LATICACIDVEN in the last 168 hours.  Recent Results (from the past 240 hour(s))  Respiratory Panel by RT PCR (Flu A&B, Covid) - Nasopharyngeal Swab     Status: None   Collection Time: 01/04/20  1:32 PM   Specimen: Nasopharyngeal Swab  Result Value Ref Range Status   SARS Coronavirus 2 by RT PCR NEGATIVE NEGATIVE Final    Comment: (NOTE) SARS-CoV-2 target nucleic acids are NOT DETECTED.  The SARS-CoV-2 RNA is generally detectable in upper respiratoy specimens during the acute phase of infection. The lowest concentration of SARS-CoV-2 viral copies this assay can detect is 131 copies/mL. A negative result does not preclude SARS-Cov-2 infection and should not be used as the sole basis for treatment or other patient management decisions. A negative result may occur with  improper specimen collection/handling, submission of specimen other than nasopharyngeal swab, presence of viral mutation(s) within the areas targeted by this assay, and inadequate number of viral copies (<131 copies/mL). A negative result must be combined with clinical observations, patient history, and epidemiological information. The expected result is Negative.  Fact Sheet for Patients:  https://www.moore.com/  Fact Sheet for Healthcare Providers:  https://www.young.biz/  This test is no t yet approved or cleared by the [place] FDA and  has been  authorized for detection and/or diagnosis of SARS-CoV-2 by FDA under an Emergency Use Authorization (EUA). This EUA will remain  in effect (meaning this test can be used) for the duration of the COVID-19 declaration under Section 564(b)(1) of the Act, 21 U.S.C. section 360bbb-3(b)(1), unless the authorization is terminated or revoked sooner.     Influenza A by PCR NEGATIVE NEGATIVE Final   Influenza B by PCR NEGATIVE NEGATIVE Final    Comment: (NOTE) The Xpert Xpress SARS-CoV-2/FLU/RSV assay is intended as an aid in  the diagnosis of influenza from Nasopharyngeal swab specimens and  should not be used as a sole basis for treatment. Nasal washings and  aspirates are unacceptable for Xpert Xpress SARS-CoV-2/FLU/RSV  testing.  Fact Sheet for Patients: https://www.moore.com/  Fact Sheet for Healthcare Providers: https://www.young.biz/  This test is not yet approved or cleared by the [place] FDA and  has been authorized for detection and/or diagnosis of SARS-CoV-2 by  FDA under an Emergency Use Authorization (EUA). This EUA will remain  in effect (meaning this test can be used) for the duration of the  Covid-19 declaration under Section 564(b)(1) of the Act, 21  U.S.C. section 360bbb-3(b)(1), unless the authorization is  terminated or revoked. Performed at [facility], [address] [place], [place] [address]   MRSA PCR Screening     Status: None   Collection Time: 01/04/20  3:27 PM   Specimen: Nasal Mucosa; Nasopharyngeal  Result Value Ref Range Status   MRSA by PCR NEGATIVE NEGATIVE Final    Comment:        The GeneXpert  MRSA Assay (FDA approved for NASAL specimens only), is one component of a comprehensive MRSA colonization surveillance program. It is not intended to diagnose MRSA infection nor to guide or monitor treatment for MRSA infections. Performed at [facility], [address] [place], [place]  [address]   Culture, blood (routine x 2)     Status: None (Preliminary result)   Collection Time: 01/07/20  9:33 AM   Specimen: BLOOD LEFT ARM  Result Value Ref Range Status   Specimen Description BLOOD LEFT ARM  Final   Special Requests   Final    BOTTLES DRAWN AEROBIC AND ANAEROBIC Blood Culture results may not be optimal due to an inadequate volume of blood received in culture bottles   Culture   Final    NO GROWTH 3 DAYS Performed at [facility], [address], [place], [place] [address]    Report Status PENDING  Incomplete  Culture, blood (routine x 2)     Status: None (Preliminary result)   Collection Time: 01/07/20  9:35 AM   Specimen: BLOOD  Result Value Ref Range Status   Specimen Description BLOOD LEFT ANTECUBITAL  Final   Special Requests   Final    BOTTLES DRAWN AEROBIC AND ANAEROBIC Blood Culture adequate volume   Culture   Final    NO GROWTH 3 DAYS Performed at [facility], [address], [place], [place] [address]    Report Status PENDING  Incomplete  Culture, respiratory (non-expectorated)     Status: None   Collection Time: 01/07/20 10:31 PM   Specimen: Tracheal Aspirate; Respiratory  Result Value Ref Range Status   Specimen Description TRACHEAL ASPIRATE  Final   Special Requests NONE  Final   Gram Stain   Final    RARE WBC PRESENT, PREDOMINANTLY PMN MODERATE SQUAMOUS EPITHELIAL CELLS PRESENT ABUNDANT GRAM POSITIVE COCCI ABUNDANT GRAM POSITIVE RODS RARE GRAM NEGATIVE RODS    Culture   Final    FEW Normal respiratory flora-no Staph aureus or Pseudomonas seen Performed at [facility], [address], [place], [place] [address]    Report Status 01/10/2020 FINAL  Final         Radiology Studies: No results found.      Scheduled Meds: . chlorhexidine  15 mL Mouth Rinse BID  . Chlorhexidine Gluconate Cloth  6 each Topical Daily  . docusate  100 mg Per Tube BID  . feeding supplement (PROSource TF)  45 mL Per Tube TID  . free  water  200 mL Per Tube Q6H  . furosemide  20 mg Per Tube Daily  . hydrochlorothiazide  25 mg Per Tube Daily  . insulin aspart  0-20 Units Subcutaneous Q4H  . insulin aspart  8 Units Subcutaneous Q4H  . insulin glargine  50 Units Subcutaneous BID  . losartan  50 mg Per Tube BID  . mouth rinse  15 mL Mouth Rinse q12n4p  . metoprolol tartrate  100 mg Per Tube BID  . pantoprazole sodium  40 mg Per Tube Daily  . polyethylene glycol  17 g Per Tube Daily  . senna-docusate  1 tablet Per Tube BID   Continuous Infusions: . cefTRIAXone (ROCEPHIN)  IV 2 g (01/10/20 1056)  . clevidipine Stopped (01/08/20 1139)  . feeding supplement (GLUCERNA 1.2 CAL) 60 mL/hr at 01/10/20 0700     LOS: 6 days    Time spent:40 min    Dove G[place]am, Roselind Messier, MD [facility] Pager [phone]  If 7PM-7AM, please contact night-coverage [email or web address] Password [email or web address] 01/10/2020, 11:06 AM

## 2020-01-10 NOTE — Progress Notes (Signed)
Inpatient Rehab Admissions Coordinator:   Met with patient at bedside to discuss potential CIR admission. Pt. Stated interest. Will pursue for potential admit next week, pending bed availability.  Waylon Hershey, MS, CCC-SLP Rehab Admissions Coordinator  [phone] (celll) [phone] (office) 

## 2020-01-10 NOTE — Progress Notes (Signed)
STROKE TEAM PROGRESS NOTE   INTERVAL HISTORY Her daughter is at the bedside. She is sitting in chair, just worked with PT/OT.  More alert today, oriented to people and age, not to time.  Able to follow simple commands.  PT/OT recommend CIR.  Vitals:   01/10/20 0300 01/10/20 0400 01/10/20 0500 01/10/20 0600  BP: (!) 126/43 (!) 135/49  (!) 106/28  Pulse: 65 73 66 67  Resp: 15 17 16 16   Temp:  99.4 F (37.4 C)    TempSrc:  Axillary    SpO2: 97% 97% 97% 98%  Weight:   104 kg   Height:       CBC:  Recent Labs  Lab 01/04/20 1307 01/04/20 1614 01/08/20 0250 01/09/20 0304  WBC 10.1   < > 11.2* 9.1  NEUTROABS 6.6  --   --  6.2  HGB 15.4*  16.7*   < > 14.0 12.9  HCT 48.8*  49.0*   < > 44.2 40.6  MCV 94.0   < > 92.3 94.0  PLT PLATELET CLUMPS NOTED ON SMEAR, COUNT APPEARS ADEQUATE   < > 182 192   < > = values in this interval not displayed.   Basic Metabolic Panel:  Recent Labs  Lab 01/08/20 0250 01/09/20 0304  NA 146* 147*  K 4.0 3.7  CL 104 101  CO2 28 34*  GLUCOSE 181* 236*  BUN 35* 40*  CREATININE 1.20* 1.37*  CALCIUM 9.0 8.8*  MG 2.0 1.9  PHOS 2.3* 4.0   Lipid Panel:  Recent Labs  Lab 01/04/20 1601 01/04/20 1601 01/06/20 0512  CHOL 201*  --   --   TRIG 654*  606*   < > 398*  HDL 37*  --   --   CHOLHDL 5.4  --   --   VLDL UNABLE TO CALCULATE IF TRIGLYCERIDE OVER 400 mg/dL  --   --   LDLCALC UNABLE TO CALCULATE IF TRIGLYCERIDE OVER 400 mg/dL  --   --    < > = values in this interval not displayed.   HgbA1c:  Recent Labs  Lab 01/04/20 1601  HGBA1C 8.6*   Urine Drug Screen: No results for input(s): LABOPIA, COCAINSCRNUR, LABBENZ, AMPHETMU, THCU, LABBARB in the last 168 hours.  Alcohol Level No results for input(s): ETH in the last 168 hours.  IMAGING past 24 hours No results found.  PHYSICAL EXAM       Temp:  [98 F (36.7 C)-99.9 F (37.7 C)] 98 F (36.7 C) (11/13 0800) Pulse Rate:  [61-80] 66 (11/13 1200) Resp:  [15-19] 17 (11/13 1200) BP:  (106-174)/(28-109) 140/61 (11/13 1200) SpO2:  [96 %-99 %] 97 % (11/13 1200) Weight:  [104 kg] 104 kg (11/13 0500)  General - Well nourished, well developed, in no apparent distress.  Ophthalmologic - fundi not visualized due to noncooperation.  Cardiovascular - Regular rhythm and rate.  Neuro - awake, alert, orientated to age and people.  Not oriented to time.  Moderate dysarthria, no aphasia, but paucity of speech, naming 2/3, able to repeat.  Able to follow simple commands.  Visual field fall, PERRL, no gaze deviation.  Right facial droop, tongue midline.  Right upper extremity flaccid, right lower extremity 2 -/5.  Left upper extremity 4/5, left lower extremity at least 3/5.  Sensation symmetrical, left finger-to-nose slow but grossly intact.  Gait not tested.   ASSESSMENT/PLAN Ms. Storey C Siple is a 86 y.o. female with history of diabetes, hypertension, congestive heart failure, colon polyps  presenting with aphasia and R sided weakness which progressed to lethargy and emesis w/ assymetric pupils.   ICH:   L BG ICH likely secondary to hypertension  CT head L thalamic and basal ganglia IPH w/ local mass effect and partial effacement 3rd ventricle w/ 69mm midline shift. Old pontine lacune. Anterior R frontal lobe meningioma vs calcification.   CT head after intubation unchanged  MRI  No underlying basal ganglia lesion. Extensive small vessel disease.   MRA  Intracranial atherosclerosis.   Carotid Doppler bilateral 1-39% carotid stenosis.  2D Echo EF 55 to 60%.  TG > 400 mg/dL, direct LDL 791.5   AVWP7X 8.6  VTE prophylaxis - Heparin subq  aspirin 81 mg daily prior to admission, now on No antithrombotic given hemorrhage. Can restart ASA after bleed resolution, will need outpatient follow up for repeat imaging.  Therapy recommendations:  CIR  Disposition:  pending   Acute hypoxic and hypercapnic respiratory failure due to hemorrhagic stroke  Secondary to  stroke  Intubated in the ED d/t lethargy  Extubated 10/9  Code status DNR  CCM available prn   Tracheobronchitis vs PNA Leukocytosis   WBC 15.3,->13.3->11.5->11.2->9.1->9.7  TMax 100.5->afebrile   blood Cx NGTD  resp Cx NGTD  Rocephin 11/11>>  (plan 5 days) - Stop 11/16   Hypertension  Home meds:  catapress 0.2 am + 0.3 pm; lasix 20 (w/ K), metoprolol 200 XL  BP as high as 171/157 on admission  . Off Cleviprex . Increase SBP goal < 160 . BP on the high side . On metoprolol, HCTZ, lasix, lorsatan . Add hydralazine 50 Q8 . Long-term BP goal normotensive  Hyperlipidemia  Home meds:  pravachol 20  Hold statin in setting of acute hemorrhage   TG > 400  mg/dL, Direct LDL 480.1   Resume statin at discharge  Diabetes type II Uncontrolled  Home meds:  70/30 80u am + lunch, 70u hs  HgbA1c 8.6, goal < 7.0  CBGs  On lantus 50, TF coverage 6->8u q4h   SSI  Close PCP follow-up  Dysphagia . Secondary to stroke . NPO . Cortrak w/ TF and FW . Failed MBSS 10/9 . Speech on board   Other Stroke Risk Factors  Advanced Age >/= 9   Morbid Obesity, Body mass index is 40.61 kg/m., BMI >/= 30 associated with increased stroke risk, recommend weight loss, diet and exercise as appropriate   Family hx stroke (brother)  Other Active Problems  CKD stage 3b Cre 1.2->1.37->1.22  CODE STATUS DNR  Hospital day # 6  I spent  35 minutes in total face-to-face time with the patient, more than 50% of which was spent in counseling and coordination of care, reviewing test results, images and medication, and discussing the diagnosis, treatment plan and potential prognosis. This patient's care requiresreview of multiple databases, neurological assessment, discussion with family, other specialists and medical decision making of high complexity. I had long discussion with daughter at bedside, updated pt current condition, treatment plan and potential prognosis, and answered all  the questions.  Daughter expressed understanding and appreciation.   Marvel Plan, MD PhD Stroke Neurology 01/10/2020 9:21 PM   To contact Stroke Continuity provider, please refer to [email or web address]. After hours, contact General Neurology

## 2020-01-10 NOTE — Progress Notes (Signed)
Upon arrival to the unit patients skin was assessed and she was found to have several foam dressings on her right arm, under which were multiple fluid filled blisters. One of these blisters opened upon removal of the foam dressing. I replaced the dressing with nonadherent pads on the open blister and petroleum gauze on the closed blisters and wrapped with kurlex.

## 2020-01-10 NOTE — PMR Pre-admission (Addendum)
PMR Admission Coordinator Pre-Admission Assessment   Patient: [name] [name] is an 87 y.o., female MRN: [id number] DOB: 02/06/1933 Height: 5' 3" (160 cm) Weight: 105.3 kg                                                                                                                                                  Insurance Information HMO: yes    PPO:      PCP:      IPA:      80/20:      OTHER:  PRIMARY: [employer] Medicare      Policy#: [id number]      Subscriber: Pt CM Name: Shana with [employer]      Phone#: [phone]     Fax#: [phone] Pre-Cert#: [id number] auth for CIR provided by Shana for admit to CIR with updates due 7 days after admit (11/24) to fax listed above      Employer:  Benefits:  Phone #: [phone]    Name:  Eff. Date: 02/28/2019 - present     Deduct: Deductible: $0 (does not have deductible)      Out of Pocket Max: $3,600 ($419.27 met)      Life Max: n/a  CIR: $295/day for days 1-5     SNF: 20 full days Outpatient:  $30/visit co-pay Home Health:  100%  Co-Ins:  DME: 80%       Co-ins: 20%  Providers: In network   SECONDARY: none    Emergency Contact Information Contact Information       Name Relation Home Work Mobile    Swaney,[name] Son     [phone]    Wilson,Pam Daughter [phone]   [phone]         Current Medical History  Patient Admitting Diagnosis: L Basal Ganglia/Thalamic Hemorrhage   History of Present Illness: [name] [name] is a 87 y.o. right-handed female with history of pancreatitis, obesity with BMI 41.32, diastolic congestive heart failure, diabetes mellitus and hypertension.  She presented on 01/04/2020 with right hemiparesis and aphasia while at church.  Admission chemistries glucose 139, BUN 31, creatinine 1.39, hemoglobin 15.4, hemoglobin A1c 8.6.  Cranial CT scan showed a 1.3 x 2.7 x 1.5 cm acute parenchymal hemorrhage centered within the left thalamus and basal ganglia.  Local mass-effect with partial effacement of the third  ventricle and 2 mm rightward midline shift at the level of the septum pellucidum.  Known chronic pontine lacunar infarct.  Patient did not receive TPA.  Placed on Cleviprex for blood pressure control.  Required intubation initially for airway protection and extubated on 01/06/2020 and followed by pulmonary services felt to be high risk for reintubation.  MRI/MRA revealed no visible mass or vascular lesion underlying the left thalamic hematoma.  Extensive chronic small vessel ischemia.  No major branch occlusion or  limiting stenosis.  Echocardiogram with ejection fraction   55-60%, no wall motion abnormalities grade 2 diastolic dysfunction.  Hospital course further complicated by postop dysphagia.  She is currently n.p.o. with alternative means of nutritional support.  Therapy evaluations completed with recommendations of physical medicine rehab consult.   Complete NIHSS TOTAL: 13 Glasgow Coma Scale Score: 15   Past Medical History      Past Medical History:  Diagnosis Date  . Colon polyp    . Diabetes mellitus 1992  . Hypertension 1996  . Pancreatitis        Family History  family history is not on file.   Prior Rehab/Hospitalizations:  Has the patient had prior rehab or hospitalizations prior to admission? Yes   Has the patient had major surgery during 100 days prior to admission? Yes   Current Medications    Current Facility-Administered Medications:  .  [DISCONTINUED] acetaminophen (TYLENOL) tablet 650 mg, 650 mg, Oral, Q4H PRN **OR** acetaminophen (TYLENOL) 160 MG/5ML solution 650 mg, 650 mg, Per Tube, Q4H PRN, 650 mg at 01/09/20 2131 **OR** acetaminophen (TYLENOL) suppository 650 mg, 650 mg, Rectal, Q4H PRN, Biby, Sharon L, NP .  albuterol (PROVENTIL) (2.5 MG/3ML) 0.083% nebulizer solution 2.5 mg, 2.5 mg, Nebulization, Q6H PRN, Biby, Sharon L, NP .  chlorhexidine (PERIDEX) 0.12 % solution 15 mL, 15 mL, Mouth Rinse, BID, Biby, Sharon L, NP, 15 mL at 01/16/20 0948 .  Chlorhexidine  Gluconate Cloth 2 % PADS 6 each, 6 each, Topical, Daily, Bhagat, Srishti L, MD, 6 each at 01/16/20 0949 .  dextrose 50 % solution 0-50 mL, 0-50 mL, Intravenous, PRN, Biby, Sharon L, NP .  docusate (COLACE) 50 MG/5ML liquid 100 mg, 100 mg, Per Tube, BID, Biby, Sharon L, NP, 100 mg at 01/15/20 2111 .  feeding supplement (ENSURE ENLIVE / ENSURE PLUS) liquid 237 mL, 237 mL, Oral, BID BM, Woods, Curtis J, MD, 237 mL at 01/15/20 1445 .  feeding supplement (GLUCERNA 1.2 CAL) liquid 960 mL, 960 mL, Per Tube, Q24H, Woods, Curtis J, MD, Last Rate: 80 mL/hr at 01/15/20 1843, 960 mL at 01/15/20 1843 .  feeding supplement (PROSource TF) liquid 90 mL, 90 mL, Per Tube, BID, Woods, Curtis J, MD, 90 mL at 01/16/20 0945 .  free water 200 mL, 200 mL, Per Tube, Q6H, Xu, Jindong, MD, 200 mL at 01/16/20 0619 .  furosemide (LASIX) tablet 20 mg, 20 mg, Per Tube, Daily, Biby, Sharon L, NP, 20 mg at 01/16/20 0946 .  heparin injection 5,000 Units, 5,000 Units, Subcutaneous, Q8H, Xu, Jindong, MD, 5,000 Units at 01/16/20 0505 .  hydrALAZINE (APRESOLINE) tablet 100 mg, 100 mg, Per Tube, Q8H, Opyd, Timothy S, MD, 100 mg at 01/16/20 1002 .  hydrochlorothiazide (HYDRODIURIL) tablet 25 mg, 25 mg, Per Tube, Daily, Biby, Sharon L, NP, 25 mg at 01/16/20 0946 .  insulin aspart (novoLOG) injection 0-20 Units, 0-20 Units, Subcutaneous, Q4H, Biby, Sharon L, NP, 3 Units at 01/16/20 0947 .  insulin aspart (novoLOG) injection 8 Units, 8 Units, Subcutaneous, Q4H, Biby, Sharon L, NP, 8 Units at 01/16/20 0948 .  insulin glargine (LANTUS) injection 50 Units, 50 Units, Subcutaneous, BID, Biby, Sharon L, NP, 50 Units at 01/16/20 0947 .  labetalol (NORMODYNE) injection 10-20 mg, 10-20 mg, Intravenous, Q2H PRN, Woods, Curtis J, MD, 10 mg at 01/14/20 0353 .  MEDLINE mouth rinse, 15 mL, Mouth Rinse, q12n4p, Biby, Sharon L, NP, 15 mL at 01/15/20 1717 .  metoprolol tartrate (LOPRESSOR) tablet 100 mg, 100 mg, Per Tube, BID, Biby, Sharon L, NP, 100 mg at    01/16/20 0946 .  pantoprazole sodium (PROTONIX) 40 mg/20 mL oral suspension 40 mg, 40 mg, Per Tube, Daily, Biby, Sharon L, NP, 40 mg at 01/16/20 0947 .  polyethylene glycol (MIRALAX / GLYCOLAX) packet 17 g, 17 g, Per Tube, Daily, Biby, Sharon L, NP, 17 g at 01/14/20 1155 .  senna-docusate (Senokot-S) tablet 1 tablet, 1 tablet, Per Tube, BID, Biby, Sharon L, NP, 1 tablet at 01/16/20 0946   Patients Current Diet:  Diet Order                  DIET DYS 2 Room service appropriate? Yes; Fluid consistency: Thin  Diet effective now                         Precautions / Restrictions Precautions Precautions: Fall Precaution Comments: R-sided hemiparesis, coretrack Restrictions Weight Bearing Restrictions: No    Has the patient had 2 or more falls or a fall with injury in the past year?Yes   Prior Activity Level Community (5-7x/wk): Pt. was active in the community PTA   Prior Functional Level Prior Function Level of Independence: Independent Comments: per RN, family has told her the pt was "in good health"   Self Care: Did the patient need help bathing, dressing, using the toilet or eating?  Independent   Indoor Mobility: Did the patient need assistance with walking from room to room (with or without device)? Independent   Stairs: Did the patient need assistance with internal or external stairs (with or without device)? Independent   Functional Cognition: Did the patient need help planning regular tasks such as shopping or remembering to take medications? Independent   Home Assistive Devices / Equipment  n/a   Prior Device Use: Indicate devices/aids used by the patient prior to current illness, exacerbation or injury? None of the above   Current Functional Level Cognition   Arousal/Alertness: Lethargic Overall Cognitive Status: Impaired/Different from baseline Difficult to assess due to: Level of arousal Current Attention Level: Sustained Orientation Level: Oriented to  person, Oriented to place, Oriented to situation Following Commands: Follows one step commands consistently, Follows one step commands with increased time Safety/Judgement: Decreased awareness of safety, Decreased awareness of deficits General Comments: Pt did not recall utilizing the stedy previous sessions. She required cues to maintain attention to particular limbs during tasks or to her body position to maintain balance. Extra time required to respond to cues. Pt unaware of leaning laterally to R until cued to correct. Attention: Sustained Sustained Attention: Impaired Sustained Attention Impairment: Functional basic Problem Solving: Impaired Problem Solving Impairment: Functional basic    Extremity Assessment (includes Sensation/Coordination)   Upper Extremity Assessment: RUE deficits/detail RUE Deficits / Details: Reports arm is "tingling" - increased sensation RUE Sensation: decreased light touch RUE Coordination: decreased fine motor, decreased gross motor LUE Deficits / Details: grossly 2-3/5 throughout LUE Coordination: decreased fine motor, decreased gross motor  Lower Extremity Assessment: Defer to PT evaluation RLE Deficits / Details: limited command following, no moving against gravity at this time LLE Deficits / Details: able to move against gravity for SAQ, but unable to follow commands for moving legs OOB     ADLs   Overall ADL's : Needs assistance/impaired Eating/Feeding: Minimal assistance Eating/Feeding Details (indicate cue type and reason): Educated on need to let her Mom self feed and to not attempt to give her anything when she is not alert. Educated how self feeding can increase the patient's level of arousal and decrease the   risk of aspiration Grooming: Wash/dry hands, Wash/dry face, Supervision/safety, Set up Grooming Details (indicate cue type and reason): pt able to wash face with LUE from bed level Upper Body Bathing: Moderate assistance, Sitting Lower Body  Bathing: Maximal assistance, Bed level Upper Body Dressing : Moderate assistance, Sitting Lower Body Dressing: Total assistance, Bed level Lower Body Dressing Details (indicate cue type and reason): to don socks Toilet Transfer: Moderate assistance, +2 for physical assistance Toilet Transfer Details (indicate cue type and reason): simulated via functional mobility with stedy- MODA  +2 to stand to stedy Functional mobility during ADLs: Moderate assistance, +2 for physical assistance (sit 0 stand; Stedy) General ADL Comments: pt able to progress functional mobility this session with pt able to complete x4 sit<>stands with MIN- MOD A +2. pt continues to present with R sided weakness  and decreased activity tolerance however pt continues to be an excellent CIR candidate d/t pts strong family support and continued progression wtih therapies     Mobility   Overal bed mobility: Needs Assistance Bed Mobility: Rolling, Sidelying to Sit Rolling: Mod assist Sidelying to sit: Mod assist, HOB elevated Supine to sit: +2 for physical assistance, +2 for safety/equipment, Total assist Sit to supine: +2 for physical assistance, +2 for safety/equipment, Total assist Sit to sidelying: Total assist, +2 for physical assistance General bed mobility comments: Facilitation of rolling with feet placed on bed surface and cues to protract R shoulder; Assist to progress RLE off bed, however pt with active activation; Assist to push trunk upright however pt pushing up through LUE     Transfers   Overall transfer level: Needs assistance Equipment used: Ambulation equipment used Transfer via Lift Equipment: Stedy Transfers: Sit to/from Stand Sit to Stand: Mod assist, +2 physical assistance (from bed) Stand pivot transfers: Total assist, +2 safety/equipment General transfer comment: Pt's arms over therapist's shoulder - facilitated anterior weight shift then to stand, blocking R knee. R bias, however able to activate  lateral weight shift, bumping each therapist's hip. Requires increased time to process, however good follow through;Difficulty with keeping RLE "turned on". MinAx2 to come to stand from elevated surface of stedy compared to modAx2 required from lower surface of bed.     Ambulation / Gait / Stairs / Wheelchair Mobility   Ambulation/Gait General Gait Details: unable to step feet     Posture / Balance Dynamic Sitting Balance Sitting balance - Comments: Balance affected by inattention; when distracted, pt with R bias Balance Overall balance assessment: Needs assistance Sitting-balance support: Feet supported (intermittent UE support) Sitting balance-Leahy Scale: Fair Sitting balance - Comments: Balance affected by inattention; when distracted, pt with R bias Postural control: Right lateral lean Standing balance support: Bilateral upper extremity supported Standing balance-Leahy Scale: Poor Standing balance comment: Standing x3 bouts of ~2 min > ~1 min > ~30 sec. Pt leans to R. Thus, required cues to shift weight to L along with R knee block and hand-over-hand positioning of R hand on stedy for safety. Intermittent moments of minA-min guard assist.     Special needs/care consideration  Skin; serous blister on R arm, Diabetic management: DM2, managed with sQ Novolog and Lantus,  and Special service needs Cortrack        Previous Home Environment (from acute therapy documentation) Living Arrangements: Alone  Lives With: Alone Available Help at Discharge: Family Type of Home: House Home Layout: One level Home Access: Ramped entrance Bathroom Shower/Tub: Walk-in shower Bathroom Toilet: Handicapped height Bathroom Accessibility: Yes How Accessible: Accessible via   walker Home Care Services: No Additional Comments: Pt unable to provide home information at this time, no family present. Will continue to assess.   Discharge Living Setting Plans for Discharge Living Setting: Patient's home Type  of Home at Discharge: House Discharge Home Layout: One level Discharge Home Access: Ramped entrance Discharge Bathroom Shower/Tub: Walk-in shower Discharge Bathroom Toilet: Handicapped height Discharge Bathroom Accessibility: Yes How Accessible: Accessible via wheelchair, Accessible via walker Does the patient have any problems obtaining your medications?: No   Social/Family/Support Systems Patient Roles: Other (Comment) Contact Information: [phone] Anticipated Caregiver: [name] Parekh (son) states that he and 3 other children will rotate to care for Pt.  Anticipated Caregiver's Contact Information: [phone] Caregiver Availability: 24/7 Discharge Plan Discussed with Primary Caregiver: Yes Is Caregiver In Agreement with Plan?: Yes Does Caregiver/Family have Issues with Lodging/Transportation while Pt is in Rehab?: No     Goals Patient/Family Goal for Rehab: PT/OT/SLP Min A/Supervision Expected length of stay: 18-21 days. Pt/Family Agrees to Admission and willing to participate: Yes Program Orientation Provided & Reviewed with Pt/Caregiver Including Roles  & Responsibilities: Yes     Decrease burden of Care through IP rehab admission: n/a   Possible need for SNF placement upon discharge: not anticipated     Patient Condition: This patient's medical and functional status has changed since the consult dated: 01/09/2020 in which the Rehabilitation Physician determined and documented that the patient's condition is appropriate for intensive rehabilitative care in an inpatient rehabilitation facility. See "History of Present Illness" (above) for medical update. Functional changes are: mod +2 for transfers and max +2 for ADLs. Patient's medical and functional status update has been discussed with the Rehabilitation physician and patient remains appropriate for inpatient rehabilitation. Will admit to inpatient rehab today.   Preadmission Screen Completed By:  Laura B Staley, CCC-SLP,  and brief updates by Caitlin Warren, PT, DPT  01/16/2020 10:39 AM ______________________________________________________________________   Discussed status with Dr. Ansel Ferrall on 01/16/20 at  10:39 AM  and received approval for admission today.   Admission Coordinator:  Caitlin E Warren, time 10:39 AM /Date 01/16/20    

## 2020-01-10 NOTE — Discharge Instructions (Signed)
Eating Plan After Stroke A stroke causes damage to the brain cells, which can affect your ability to walk, talk, and even eat. The impact of a stroke is different for everyone, and so is recovery. A good nutrition plan is important for your recovery. It can also lower your risk of another stroke. If you have difficulty chewing and swallowing your food, a dietitian or your stroke care team can help so that you can enjoy eating healthy foods. What are tips for following this plan?  Reading food labels  Choose foods that have less than 300 milligrams (mg) of sodium per serving. Limit your sodium intake to less than 1,500 mg per day.  Avoid foods that have saturated fat and trans fat.  Choose foods that are low in cholesterol. Limit the amount of cholesterol you eat each day to less than 200 mg.  Choose foods that are high in fiber. Eat 20-30 grams (g) of fiber each day.  Avoid foods with added sugar. Check the food label for ingredients such as sugar, corn syrup, honey, fructose, molasses, and cane juice. Shopping  At the grocery store, buy most of your food from areas near the walls of the store. This includes: ? Fresh fruits and vegetables. ? Dry grains, beans, nuts, and seeds. ? Fresh seafood, poultry, lean meats, and eggs. ? Low-fat dairy products.  Buy whole ingredients instead of prepackaged foods.  Buy fresh, in-season fruits and vegetables from local farmers markets.  Buy frozen fruits and vegetables in resealable bags. Cooking  Prepare foods with very little salt. Use herbs or salt-free spices instead.  Cook with heart-healthy oils, such as olive, avocado, canola, soybean, or sunflower oil.  Avoid frying foods. Bake, grill, or broil foods instead.  Remove visible fat and skin from meat and poultry before eating.  Modify food textures as told by your health care provider. Meal planning  Eat a wide variety of colorful fruits and vegetables. Make sure one-half of your  plate is filled with fruits and vegetables at each meal.  Eat fruits and vegetables that are high in potassium, such as: ? Apples, bananas, oranges, and melon. ? Sweet potatoes, spinach, zucchini, and tomatoes.  Eat fish that contain heart-healthy fats (omega-3 fats) at least twice a week. These include salmon, tuna, mackerel, and sardines.  Eat plant foods that are high in omega-3 fats, such as flaxseeds and walnuts. Add these to cereals, yogurt, or pasta dishes.  Eat several servings of high-fiber foods each day, such as fruits, vegetables, whole grains, and beans.  Do not put salt at the table for meals.  When eating out at restaurants: ? Ask the server about low-salt or salt-free food options. ? Avoid fried foods. Look for menu items that are grilled, steamed, broiled, or roasted. ? Ask if your food can be prepared without butter. ? Ask for condiments, such as salad dressings, gravy, or sauces to be served on the side.  If you have difficulty swallowing: ? Choose foods that are softer and easier to chew and swallow. ? Cut foods into small pieces and chew well before swallowing. ? Thicken liquids as told by your health care provider or dietitian. ? Let your health care provider know if your condition does not improve over time. You may need to work with a speech therapist to re-train the muscles that are used for eating. General recommendations  Involve your family and friends in your recovery, if possible. It may be helpful to have a slower meal time   and to plan meals that include foods everyone in the family can eat.  Brush your teeth with fluoride toothpaste twice a day, and floss once a day. Keeping a clean mouth can help you swallow and can also help your appetite.  Drink enough water each day to keep your urine pale yellow. If needed, set reminders or ask your family to help you remember to drink water.  Limit alcohol intake to no more than 1 drink a day for nonpregnant  women and 2 drinks a day for men. One drink equals 12 oz of beer, 5 oz of wine, or 1 oz of hard liquor. Summary  Following this eating plan can help in your stroke recovery and can decrease your risk for another stroke.  Let your health care provider know if you have problems with swallowing. You may need to work with a speech therapist. This information is not intended to replace advice given to you by your health care provider. Make sure you discuss any questions you have with your health care provider. Document Revised: 06/06/2018 Document Reviewed: 04/23/2017 Elsevier Patient Education  2020 Elsevier Inc.  

## 2020-01-11 DIAGNOSIS — N1831 Chronic kidney disease, stage 3a: Secondary | ICD-10-CM | POA: Diagnosis not present

## 2020-01-11 DIAGNOSIS — I610 Nontraumatic intracerebral hemorrhage in hemisphere, subcortical: Secondary | ICD-10-CM | POA: Diagnosis not present

## 2020-01-11 DIAGNOSIS — I10 Essential (primary) hypertension: Secondary | ICD-10-CM | POA: Diagnosis not present

## 2020-01-11 DIAGNOSIS — I611 Nontraumatic intracerebral hemorrhage in hemisphere, cortical: Secondary | ICD-10-CM | POA: Diagnosis not present

## 2020-01-11 LAB — CBC WITH DIFFERENTIAL/PLATELET
Abs Immature Granulocytes: 0.12 10*3/uL — ABNORMAL HIGH (ref 0.00–0.07)
Basophils Absolute: 0.1 10*3/uL (ref 0.0–0.1)
Basophils Relative: 1 %
Eosinophils Absolute: 0.4 10*3/uL (ref 0.0–0.5)
Eosinophils Relative: 3 %
HCT: 42.6 % (ref 36.0–46.0)
Hemoglobin: 13.4 g/dL (ref 12.0–15.0)
Immature Granulocytes: 1 %
Lymphocytes Relative: 14 %
Lymphs Abs: 1.6 10*3/uL (ref 0.7–4.0)
MCH: 29.3 pg (ref 26.0–34.0)
MCHC: 31.5 g/dL (ref 30.0–36.0)
MCV: 93.2 fL (ref 80.0–100.0)
Monocytes Absolute: 1.3 10*3/uL — ABNORMAL HIGH (ref 0.1–1.0)
Monocytes Relative: 12 %
Neutro Abs: 7.8 10*3/uL — ABNORMAL HIGH (ref 1.7–7.7)
Neutrophils Relative %: 69 %
Platelets: 221 10*3/uL (ref 150–400)
RBC: 4.57 MIL/uL (ref 3.87–5.11)
RDW: 14.1 % (ref 11.5–15.5)
WBC: 11.2 10*3/uL — ABNORMAL HIGH (ref 4.0–10.5)
nRBC: 0 % (ref 0.0–0.2)

## 2020-01-11 LAB — GLUCOSE, CAPILLARY
Glucose-Capillary: 131 mg/dL — ABNORMAL HIGH (ref 70–99)
Glucose-Capillary: 142 mg/dL — ABNORMAL HIGH (ref 70–99)
Glucose-Capillary: 178 mg/dL — ABNORMAL HIGH (ref 70–99)
Glucose-Capillary: 163 mg/dL — ABNORMAL HIGH (ref 70–99)
Glucose-Capillary: 150 mg/dL — ABNORMAL HIGH (ref 70–99)
Glucose-Capillary: 142 mg/dL — ABNORMAL HIGH (ref 70–99)

## 2020-01-11 LAB — COMPREHENSIVE METABOLIC PANEL
ALT: 45 U/L — ABNORMAL HIGH (ref 0–44)
AST: 47 U/L — ABNORMAL HIGH (ref 15–41)
Albumin: 2.4 g/dL — ABNORMAL LOW (ref 3.5–5.0)
Alkaline Phosphatase: 54 U/L (ref 38–126)
Anion gap: 9 (ref 5–15)
BUN: 38 mg/dL — ABNORMAL HIGH (ref 8–23)
CO2: 34 mmol/L — ABNORMAL HIGH (ref 22–32)
Calcium: 8.5 mg/dL — ABNORMAL LOW (ref 8.9–10.3)
Chloride: 101 mmol/L (ref 98–111)
Creatinine, Ser: 1.17 mg/dL — ABNORMAL HIGH (ref 0.44–1.00)
GFR, Estimated: 46 mL/min — ABNORMAL LOW (ref >60)
Glucose, Bld: 130 mg/dL — ABNORMAL HIGH (ref 70–99)
Potassium: 3.6 mmol/L (ref 3.5–5.1)
Sodium: 144 mmol/L (ref 135–145)
Total Bilirubin: 0.5 mg/dL (ref 0.3–1.2)
Total Protein: 6.1 g/dL — ABNORMAL LOW (ref 6.5–8.1)

## 2020-01-11 LAB — MAGNESIUM: Magnesium: 2.2 mg/dL (ref 1.7–2.4)

## 2020-01-11 LAB — PHOSPHORUS: Phosphorus: 3.5 mg/dL (ref 2.5–4.6)

## 2020-01-11 MED ORDER — HYDRALAZINE HCL 50 MG PO TABS
50.0000 mg | ORAL_TABLET | Freq: Four times a day (QID) | ORAL | Status: DC
  Administered 2020-01-11 – 2020-01-12 (×5): 50 mg
  Filled 2020-01-11 (×6): qty 1

## 2020-01-11 MED ORDER — POTASSIUM CHLORIDE 20 MEQ/15ML (10%) PO SOLN
50.0000 meq | Freq: Once | ORAL | Status: AC
  Administered 2020-01-11: 50 meq
  Filled 2020-01-11: qty 45

## 2020-01-11 NOTE — Progress Notes (Signed)
PROGRESS NOTE    [name] [name]  UMP:[id number] DOB: 10-30-1932 DOA: 01/04/2020 PCP: [name], MD     Brief Narrative:  86 yo WF PMHx DM type II uncontrolled with complication, DM retinopathy both eyes, Chronic Diastolic CHF, HTN, HLD, CKD stage IIIb, pancreatitis, Hx colonic polyps  with a chief complaint of acute onset aphasia and right-sided weakness.  This started while she was at church and occurred less than an hour ago.  Patient arrived as a code stroke.  Level 5 caveat acuity of condition.   Subjective: 3/14 son at bedside states patient tired from speaking with family all day.  States patient was in good spirits continue right hemiparesis  Assessment & Plan: Covid vaccination; vaccinated   Active Problems:   ICH (intracerebral hemorrhage) (HCC)   Morbid obesity (HCC)   Chronic diastolic congestive heart failure (HCC)   Uncontrolled type 2 diabetes mellitus with hyperglycemia (HCC)   Essential hypertension   FUO (fever of unknown origin)   CKD (chronic kidney disease), stage III (HCC)   LEFT basal ganglia hemorrhagic CVA -Secondary to HTN -Code Stroke CT head L thalamic and basal ganglia IPH w/ local mass effect and partial effacement 3rd ventricle w/ 62mm midline shift. Small vessel disease. Atrophy. Old pontine lacune. Anterior R frontal lobe meningioma vs calcification.  -CT head after intubation unchanged -MRI  No underlying basal ganglia lesion. Extensive small vessel disease.  -MRA  Intracranial atherosclerosis.  -Carotid Doppler bilateral 1-39% carotid stenosis. -2D Echo ejection fraction 55 to 60%. -LDL UTC as TG > 400 mg/dL, direct LDL 400.8  -[id number] 8.6 -ASA 81 mg daily -11/14 CIR clearance pending  Acute respiratory failure with hypoxia and hypercapnia -Secondary stroke -Intubated in ED>>>> 10/9 -High risk for reintubation  Tracheobronchitis vs PNA -Leukocytosis resolved -Afebrile overnight  -Neurology recommended completion of 5-day course  of ceftriaxone 11/11>>> 11/16  Essential HTN -On admission BP 171/157 -Cleviprex drip -Lasix 20 mg daily -Labetalol PRN -Losartan 50 mg BID -Metoprolol 100 mg BID  HLD -Lipitor 80 mg daily (hold) in setting of acute hemorrhage. -11/7 TG= 654.  LDL =UTC, direct LDL= 121.8 -Resume statin at discharge  DM type II uncontrolled with Hyperglycemia -11/7 hemoglobin A1c= 8.6 -Lantus 50 units BID -NovoLog 8 units q 4 hr -Resistant SSI -11/13 consult diabetic coordinator; uncontrolled diabetic with stroke -11/13 consult nutrition; uncontrolled diabetic with stroke.  Educate on diet  Dysphagia -Continue tube feeds per core track  Morbidly obese -See diabetes  CKD stage IIIb (baseline Cr 1.2)  Lab Results  Component Value Date   CREATININE 1.17 (H) 01/11/2020   CREATININE 1.22 (H) 01/10/2020   CREATININE 1.37 (H) 01/09/2020   CREATININE 1.20 (H) 01/08/2020   CREATININE 1.26 (H) 01/07/2020  -At baseline  Hypokalemia -Potassium goal> 4 -Potassium per NG tube 50 mEq    DVT prophylaxis: SCD Code Status: DNR Family Communication: 11/14 son at bedside discussed plan of care answered all questions Status is: Inpatient    Dispo: The patient is from: Home              Anticipated d/c is to: SNF?              Anticipated d/c date is: Per neurology              Patient currently unstable      Consultants:  Neurology PCCM    Procedures/Significant Events:  11/8 echocardiogram;FINDINGS  Left Ventricle: Left ventricular ejection fraction, by estimation, is 55  to 60%. The  left ventricle has normal function. The left ventricle has no  regional wall motion abnormalities. Definity contrast agent was given IV  to delineate the left ventricular  endocardial borders. The left ventricular internal cavity size was normal  in size. There is no left ventricular hypertrophy. Left ventricular  diastolic parameters are consistent with Grade II diastolic dysfunction   (pseudonormalization). Elevated left  ventricular end-diastolic pressure.   Right Ventricle: The right ventricular size is normal. No increase in  right ventricular wall thickness. Right ventricular systolic function is  normal.   Left Atrium: Left atrial size was mildly dilated.   Right Atrium: Right atrial size was normal in size.   Pericardium: There is no evidence of pericardial effusion.   Mitral Valve: The mitral valve is degenerative in appearance. There is  mild thickening of the mitral valve leaflet(s). There is mild  calcification of the mitral valve leaflet(s). Moderate mitral annular  calcification. Trivial mitral valve regurgitation.  No evidence of mitral valve stenosis.   Tricuspid Valve: The tricuspid valve is normal in structure. Tricuspid  valve regurgitation is not demonstrated. No evidence of tricuspid  stenosis.   Aortic Valve: The aortic valve is tricuspid. Aortic valve regurgitation is  not visualized. Mild aortic valve sclerosis is present, with no evidence  of aortic valve stenosis.   Pulmonic Valve: The pulmonic valve was normal in structure. Pulmonic valve  regurgitation is not visualized. No evidence of pulmonic stenosis.   Aorta: The aortic root is normal in size and structure.   Venous: The inferior vena cava is normal in size with greater than 50%  respiratory variability, suggesting right atrial pressure of 3 mmHg.   IAS/Shunts: No atrial level shunt detected by color flow Doppler.   I have personally reviewed and interpreted all radiology studies and my findings are as above.  VENTILATOR SETTINGS:    Cultures   Antimicrobials: Anti-infectives (From admission, onward)   Start     Ordered Stop   01/11/20 1000  cefTRIAXone (ROCEPHIN) 2 g in sodium chloride 0.9 % 100 mL IVPB        01/10/20 1128 01/13/20 0959   01/08/20 0900  cefTRIAXone (ROCEPHIN) 2 g in sodium chloride 0.9 % 100 mL IVPB  Status:  Discontinued        01/08/20 0801  01/10/20 1128       Devices    LINES / TUBES:      Continuous Infusions: . cefTRIAXone (ROCEPHIN)  IV 2 g (01/11/20 1042)  . feeding supplement (GLUCERNA 1.2 CAL) 1,000 mL (01/11/20 1043)     Objective: Vitals:   01/11/20 0910 01/11/20 0940 01/11/20 1201 01/11/20 1609  BP: (!) 163/55 (!) 150/52  (!) 152/58  Pulse:   72 76  Resp:   18 18  Temp:   98 F (36.7 C)   TempSrc:   Oral   SpO2:    94%  Weight:      Height:        Intake/Output Summary (Last 24 hours) at 01/11/2020 1741 Last data filed at 01/11/2020 1517 Gross per 24 hour  Intake 2297 ml  Output 600 ml  Net 1697 ml   Filed Weights   01/04/20 1300 01/09/20 0426 01/10/20 0500  Weight: 105.8 kg 105.8 kg 104 kg    Examination:  General: A/O x4, No acute respiratory distress Eyes: negative scleral hemorrhage, negative anisocoria, negative icterus ENT: Negative Runny nose, negative gingival bleeding, Neck:  Negative scars, masses, torticollis, lymphadenopathy, JVD Lungs: Clear to auscultation  bilaterally without wheezes or crackles Cardiovascular: Regular rate and rhythm without murmur gallop or rub normal S1 and S2 Abdomen: MORBIDLY OBESE, negative abdominal pain, nondistended, positive soft, bowel sounds, no rebound, no ascites, no appreciable mass Extremities: No significant cyanosis, clubbing, or edema bilateral lower extremities Skin: Negative rashes, lesions, ulcers Psychiatric:  Negative depression, negative anxiety, negative fatigue, negative mania  Central nervous system:  Cranial nerves II through XII intact, tongue/uvula midline, LUE/LLE muscle strength 5/5, sensation intact throughout, RIGHT sided hemiparesis,  negative dysarthria, negative expressive aphasia, negative receptive aphasia.  .     Data Reviewed: Care during the described time interval was provided by me .  I have reviewed this patient's available data, including medical history, events of note, physical examination, and all  test results as part of my evaluation.  CBC: Recent Labs  Lab 01/07/20 0718 01/07/20 0718 01/07/20 0814 01/08/20 0250 01/09/20 0304 01/10/20 0617 01/11/20 0109  WBC 11.5*  --   --  11.2* 9.1 9.7 11.2*  NEUTROABS  --   --   --   --  6.2  --  7.8*  HGB 14.3   < > 13.3 14.0 12.9 13.0 13.4  HCT 44.4   < > 39.0 44.2 40.6 41.4 42.6  MCV 91.4  --   --  92.3 94.0 93.9 93.2  PLT 184  --   --  182 192 211 221   < > = values in this interval not displayed.   Basic Metabolic Panel: Recent Labs  Lab 01/07/20 0116 01/07/20 0116 01/07/20 0718 01/07/20 0718 01/07/20 0814 01/08/20 0250 01/09/20 0304 01/10/20 0617 01/11/20 0109  NA 143   < > 143   < > 144 146* 147* 144 144  K 3.7   < > 3.3*   < > 3.3* 4.0 3.7 3.7 3.6  CL 102   < > 101  --   --  104 101 100 101  CO2 28   < > 32  --   --  28 34* 34* 34*  GLUCOSE 230*   < > 227*  --   --  181* 236* 123* 130*  BUN 34*   < > 32*  --   --  35* 40* 41* 38*  CREATININE 1.25*   < > 1.26*  --   --  1.20* 1.37* 1.22* 1.17*  CALCIUM 8.8*   < > 9.1  --   --  9.0 8.8* 8.4* 8.5*  MG 1.6*  --  1.7  --   --  2.0 1.9  --  2.2  PHOS 2.5  --  2.7  --   --  2.3* 4.0  --  3.5   < > = values in this interval not displayed.   GFR: Estimated Creatinine Clearance: 40.5 mL/min (A) (by C-G formula based on SCr of 1.17 mg/dL (H)). Liver Function Tests: Recent Labs  Lab 01/11/20 0109  AST 47*  ALT 45*  ALKPHOS 54  BILITOT 0.5  PROT 6.1*  ALBUMIN 2.4*   No results for input(s): LIPASE, AMYLASE in the last 168 hours. No results for input(s): AMMONIA in the last 168 hours. Coagulation Profile: No results for input(s): INR, PROTIME in the last 168 hours. Cardiac Enzymes: No results for input(s): CKTOTAL, CKMB, CKMBINDEX, TROPONINI in the last 168 hours. BNP (last 3 results) No results for input(s): PROBNP in the last 8760 hours. HbA1C: No results for input(s): HGBA1C in the last 72 hours. CBG: Recent Labs  Lab 01/10/20 2348 01/11/20  0350  01/11/20 0756 01/11/20 1233 01/11/20 1712  GLUCAP 115* 131* 142* 178* 163*   Lipid Profile: No results for input(s): CHOL, HDL, LDLCALC, TRIG, CHOLHDL, LDLDIRECT in the last 72 hours. Thyroid Function Tests: No results for input(s): TSH, T4TOTAL, FREET4, T3FREE, THYROIDAB in the last 72 hours. Anemia Panel: No results for input(s): VITAMINB12, FOLATE, FERRITIN, TIBC, IRON, RETICCTPCT in the last 72 hours. Sepsis Labs: No results for input(s): PROCALCITON, LATICACIDVEN in the last 168 hours.  Recent Results (from the past 240 hour(s))  Respiratory Panel by RT PCR (Flu A&B, Covid) - Nasopharyngeal Swab     Status: None   Collection Time: 01/04/20  1:32 PM   Specimen: Nasopharyngeal Swab  Result Value Ref Range Status   SARS Coronavirus 2 by RT PCR NEGATIVE NEGATIVE Final    Comment: (NOTE) SARS-CoV-2 target nucleic acids are NOT DETECTED.  The SARS-CoV-2 RNA is generally detectable in upper respiratoy specimens during the acute phase of infection. The lowest concentration of SARS-CoV-2 viral copies this assay can detect is 131 copies/mL. A negative result does not preclude SARS-Cov-2 infection and should not be used as the sole basis for treatment or other patient management decisions. A negative result may occur with  improper specimen collection/handling, submission of specimen other than nasopharyngeal swab, presence of viral mutation(s) within the areas targeted by this assay, and inadequate number of viral copies (<131 copies/mL). A negative result must be combined with clinical observations, patient history, and epidemiological information. The expected result is Negative.  Fact Sheet for Patients:  https://www.moore.com/https://www.fda.gov/media/142436/download  Fact Sheet for Healthcare Providers:  https://www.young.biz/https://www.fda.gov/media/142435/download  This test is no t yet approved or cleared by the [place] FDA and  has been authorized for detection and/or diagnosis of SARS-CoV-2 by FDA under  an Emergency Use Authorization (EUA). This EUA will remain  in effect (meaning this test can be used) for the duration of the COVID-19 declaration under Section 564(b)(1) of the Act, 21 U.S.C. section 360bbb-3(b)(1), unless the authorization is terminated or revoked sooner.     Influenza A by PCR NEGATIVE NEGATIVE Final   Influenza B by PCR NEGATIVE NEGATIVE Final    Comment: (NOTE) The Xpert Xpress SARS-CoV-2/FLU/RSV assay is intended as an aid in  the diagnosis of influenza from Nasopharyngeal swab specimens and  should not be used as a sole basis for treatment. Nasal washings and  aspirates are unacceptable for Xpert Xpress SARS-CoV-2/FLU/RSV  testing.  Fact Sheet for Patients: https://www.moore.com/https://www.fda.gov/media/142436/download  Fact Sheet for Healthcare Providers: https://www.young.biz/https://www.fda.gov/media/142435/download  This test is not yet approved or cleared by the [place] FDA and  has been authorized for detection and/or diagnosis of SARS-CoV-2 by  FDA under an Emergency Use Authorization (EUA). This EUA will remain  in effect (meaning this test can be used) for the duration of the  Covid-19 declaration under Section 564(b)(1) of the Act, 21  U.S.C. section 360bbb-3(b)(1), unless the authorization is  terminated or revoked. Performed at [facility], [address] [place], [place] [address]   MRSA PCR Screening     Status: None   Collection Time: 01/04/20  3:27 PM   Specimen: Nasal Mucosa; Nasopharyngeal  Result Value Ref Range Status   MRSA by PCR NEGATIVE NEGATIVE Final    Comment:        The GeneXpert MRSA Assay (FDA approved for NASAL specimens only), is one component of a comprehensive MRSA colonization surveillance program. It is not intended to diagnose MRSA infection nor to guide or monitor  treatment for MRSA infections. Performed at [facility], [address] [place], [place] [address]   Culture, blood (routine x 2)     Status: None (Preliminary  result)   Collection Time: 01/07/20  9:33 AM   Specimen: BLOOD LEFT ARM  Result Value Ref Range Status   Specimen Description BLOOD LEFT ARM  Final   Special Requests   Final    BOTTLES DRAWN AEROBIC AND ANAEROBIC Blood Culture results may not be optimal due to an inadequate volume of blood received in culture bottles   Culture   Final    NO GROWTH 4 DAYS Performed at [facility], [address], [place], [place] [address]    Report Status PENDING  Incomplete  Culture, blood (routine x 2)     Status: None (Preliminary result)   Collection Time: 01/07/20  9:35 AM   Specimen: BLOOD  Result Value Ref Range Status   Specimen Description BLOOD LEFT ANTECUBITAL  Final   Special Requests   Final    BOTTLES DRAWN AEROBIC AND ANAEROBIC Blood Culture adequate volume   Culture   Final    NO GROWTH 4 DAYS Performed at [facility], [address] [place], [place] [address]    Report Status PENDING  Incomplete  Culture, respiratory (non-expectorated)     Status: None   Collection Time: 01/07/20 10:31 PM   Specimen: Tracheal Aspirate; Respiratory  Result Value Ref Range Status   Specimen Description TRACHEAL ASPIRATE  Final   Special Requests NONE  Final   Gram Stain   Final    RARE WBC PRESENT, PREDOMINANTLY PMN MODERATE SQUAMOUS EPITHELIAL CELLS PRESENT ABUNDANT GRAM POSITIVE COCCI ABUNDANT GRAM POSITIVE RODS RARE GRAM NEGATIVE RODS    Culture   Final    FEW Normal respiratory flora-no Staph aureus or Pseudomonas seen Performed at [facility], [address] [place], [place] [address]    Report Status 01/10/2020 FINAL  Final         Radiology Studies: No results found.      Scheduled Meds: . chlorhexidine  15 mL Mouth Rinse BID  . Chlorhexidine Gluconate Cloth  6 each Topical Daily  . docusate  100 mg Per Tube BID  . feeding supplement (PROSource TF)  45 mL Per Tube TID  . free water  200 mL Per Tube Q6H  . furosemide  20 mg Per Tube Daily  .  heparin injection (subcutaneous)  5,000 Units Subcutaneous Q8H  . hydrALAZINE  50 mg Per Tube Q6H  . hydrochlorothiazide  25 mg Per Tube Daily  . insulin aspart  0-20 Units Subcutaneous Q4H  . insulin aspart  8 Units Subcutaneous Q4H  . insulin glargine  50 Units Subcutaneous BID  . losartan  50 mg Per Tube BID  . mouth rinse  15 mL Mouth Rinse q12n4p  . metoprolol tartrate  100 mg Per Tube BID  . pantoprazole sodium  40 mg Per Tube Daily  . polyethylene glycol  17 g Per Tube Daily  . senna-docusate  1 tablet Per Tube BID   Continuous Infusions: . cefTRIAXone (ROCEPHIN)  IV 2 g (01/11/20 1042)  . feeding supplement (GLUCERNA 1.2 CAL) 1,000 mL (01/11/20 1043)     LOS: 7 days    Time spent:40 min    Hailley Byers, Roselind Messier, MD [facility] Pager [phone]  If 7PM-7AM, please contact night-coverage [email or web address] Password [facility] 01/11/2020, 5:41 PM

## 2020-01-11 NOTE — Progress Notes (Incomplete)
Had 7 beats V-tach per tele.  Her SBP was in the 180s and had received labetolol prn just 15 min prior.  Asymptomatic at this time.  No signs of distress.  Sent message to Dr Carolyne Littles

## 2020-01-11 NOTE — Progress Notes (Signed)
STROKE TEAM PROGRESS NOTE   INTERVAL HISTORY Her son is at the bedside. She is lying in bed, more awake alert, interactive today.  Still has right hemiplegia.  Pending CIR.  Vitals:   01/10/20 1700 01/10/20 2020 01/10/20 2315 01/11/20 0349  BP: (!) 168/61 (!) 186/57 (!) 147/58 108/86  Pulse: 72 75 67 76  Resp: 16 18 19 18   Temp: 98.7 F (37.1 C) 98.4 F (36.9 C) 98.2 F (36.8 C) (!) 97.5 F (36.4 C)  TempSrc: Oral Oral Oral Oral  SpO2: 97% 97% 94% 94%  Weight:      Height:       CBC:  Recent Labs  Lab 01/09/20 0304 01/09/20 0304 01/10/20 0617 01/11/20 0109  WBC 9.1   < > 9.7 11.2*  NEUTROABS 6.2  --   --  7.8*  HGB 12.9   < > 13.0 13.4  HCT 40.6   < > 41.4 42.6  MCV 94.0   < > 93.9 93.2  PLT 192   < > 211 221   < > = values in this interval not displayed.   Basic Metabolic Panel:  Recent Labs  Lab 01/09/20 0304 01/09/20 0304 01/10/20 0617 01/11/20 0109  NA 147*   < > 144 144  K 3.7   < > 3.7 3.6  CL 101   < > 100 101  CO2 34*   < > 34* 34*  GLUCOSE 236*   < > 123* 130*  BUN 40*   < > 41* 38*  CREATININE 1.37*   < > 1.22* 1.17*  CALCIUM 8.8*   < > 8.4* 8.5*  MG 1.9  --   --  2.2  PHOS 4.0  --   --  3.5   < > = values in this interval not displayed.   Lipid Panel:  Recent Labs  Lab 01/04/20 1601 01/04/20 1601 01/06/20 0512  CHOL 201*  --   --   TRIG 654*  606*   < > 398*  HDL 37*  --   --   CHOLHDL 5.4  --   --   VLDL UNABLE TO CALCULATE IF TRIGLYCERIDE OVER 400 mg/dL  --   --   LDLCALC UNABLE TO CALCULATE IF TRIGLYCERIDE OVER 400 mg/dL  --   --    < > = values in this interval not displayed.   HgbA1c:  Recent Labs  Lab 01/04/20 1601  HGBA1C 8.6*   Urine Drug Screen: No results for input(s): LABOPIA, COCAINSCRNUR, LABBENZ, AMPHETMU, THCU, LABBARB in the last 168 hours.  Alcohol Level No results for input(s): ETH in the last 168 hours.  IMAGING past 24 hours No results found.  PHYSICAL EXAM       Temp:  [97.5 F (36.4 C)-98.7 F (37.1  C)] 97.5 F (36.4 C) (11/14 0349) Pulse Rate:  [62-77] 76 (11/14 0349) Resp:  [15-19] 18 (11/14 0349) BP: (108-186)/(44-109) 108/86 (11/14 0349) SpO2:  [94 %-100 %] 94 % (11/14 0349)  General - Well nourished, well developed, in no apparent distress.  Ophthalmologic - fundi not visualized due to noncooperation.  Cardiovascular - Regular rhythm and rate.  Neuro - awake, alert, orientated to age and people.  Not oriented to time.  Moderate dysarthria, no aphasia, but paucity of speech, naming 2/3, able to repeat.  Able to follow simple commands.  Visual field fall, PERRL, no gaze deviation.  Right facial droop, tongue midline.  Right upper extremity flaccid, right lower extremity 2-/5 with some movement  of knee flexion.  Left upper extremity 4/5, left lower extremity at least 3/5.  Sensation symmetrical, left finger-to-nose slow but grossly intact.  Gait not tested.   ASSESSMENT/PLAN Ms. Beretta C Teague is a 86 y.o. female with history of diabetes, hypertension, congestive heart failure, colon polyps presenting with aphasia and R sided weakness which progressed to lethargy and emesis w/ assymetric pupils.   ICH:   L BG ICH likely secondary to hypertension  CT head L thalamic and basal ganglia IPH w/ local mass effect and partial effacement 3rd ventricle w/ 34mm midline shift. Old pontine lacune. Anterior R frontal lobe meningioma vs calcification.   CT head after intubation unchanged  MRI  No underlying basal ganglia lesion. Extensive small vessel disease.   MRA  Intracranial atherosclerosis.   Carotid Doppler bilateral 1-39% carotid stenosis.  2D Echo EF 55 to 60%.  TG > 400 mg/dL, direct LDL 128.7   OMVE7M 8.6  VTE prophylaxis - Heparin subq  aspirin 81 mg daily prior to admission, now on No antithrombotic given hemorrhage. Can restart ASA after bleed resolution, will need outpatient follow up for repeat imaging.  Therapy recommendations:  CIR  Disposition:  pending    Acute hypoxic and hypercapnic respiratory failure due to hemorrhagic stroke  Secondary to stroke  Intubated in the ED d/t lethargy  Extubated 10/9  Code status DNR  CCM available prn   Tracheobronchitis vs PNA Leukocytosis   WBC 15.3,->13.3->11.5->11.2->9.1->9.7->11.2  TMax 100.5->afebrile   blood Cx NGTD  resp Cx NGTD  Rocephin 11/11>>  (plan 5 days) - Stop 11/16   Hypertension  Home meds:  catapress 0.2 am + 0.3 pm; lasix 20 (w/ K), metoprolol 200 XL  BP as high as 171/157 on admission  . Off Cleviprex . Increase SBP goal < 160 . BP on the high side . On metoprolol, HCTZ, lasix, lorsatan . on hydralazine 50 Q8->50 Q6 . Long-term BP goal normotensive  Hyperlipidemia  Home meds:  pravachol 20  Hold statin in setting of acute hemorrhage   TG > 400  mg/dL, Direct LDL 094.7   Resume statin at discharge  Diabetes type II Uncontrolled  Home meds:  70/30 80u am + lunch, 70u hs  HgbA1c 8.6, goal < 7.0  CBGs  On lantus 50, TF coverage 6->8u q4h   SSI  Close PCP follow-up  Dysphagia . Secondary to stroke . NPO . Cortrak w/ TF and FW . Failed MBSS 10/9 . Speech on board   Other Stroke Risk Factors  Advanced Age >/= 20   Morbid Obesity, Body mass index is 40.61 kg/m., BMI >/= 30 associated with increased stroke risk, recommend weight loss, diet and exercise as appropriate   Family hx stroke (brother)  Other Active Problems  CKD stage 3b Cre 1.2->1.37->1.22->1.17  CODE STATUS DNR  Hospital day # 7  Neurology will sign off. Please call with questions. Pt will follow up with stroke clinic NP at [facility] in about 4 weeks. Thanks for the consult.  Marvel Plan, MD PhD Stroke Neurology 01/11/2020 5:52 PM      To contact Stroke Continuity provider, please refer to [email or web address]. After hours, contact General Neurology

## 2020-01-11 NOTE — Progress Notes (Incomplete)
PROGRESS NOTE    [name] [name]  ZOX:[id number] DOB: 06-26-1932 DOA: 01/04/2020 PCP: [name], MD     Brief Narrative:  86 yo WF PMHx DM type II uncontrolled with complication, DM retinopathy both eyes, Chronic Diastolic CHF, HTN, HLD, CKD stage IIIb, pancreatitis, Hx colonic polyps  with a chief complaint of acute onset aphasia and right-sided weakness.  This started while she was at church and occurred less than an hour ago.  Patient arrived as a code stroke.  Level 5 caveat acuity of condition.   Subjective: 11/14 afebrile overnight    afebrile overnight A/O x4, patient joking with medical staff and her daughter   Assessment & Plan: Covid vaccination; vaccinated   Active Problems:   ICH (intracerebral hemorrhage) (HCC)   Morbid obesity (HCC)   Chronic diastolic congestive heart failure (HCC)   Uncontrolled type 2 diabetes mellitus with hyperglycemia (HCC)   Essential hypertension   FUO (fever of unknown origin)   CKD (chronic kidney disease), stage III (HCC)   LEFT basal ganglia hemorrhagic CVA -Secondary to HTN -Code Stroke CT head L thalamic and basal ganglia IPH w/ local mass effect and partial effacement 3rd ventricle w/ 2mm midline shift. Small vessel disease. Atrophy. Old pontine lacune. Anterior R frontal lobe meningioma vs calcification.  -CT head after intubation unchanged -MRI  No underlying basal ganglia lesion. Extensive small vessel disease.  -MRA  Intracranial atherosclerosis.  -Carotid Doppler bilateral 1-39% carotid stenosis. -2D Echo ejection fraction 55 to 60%. -LDL UTC as TG > 400 mg/dL, direct LDL 811.9  -JYNW2N 8.6 -ASA 81 mg daily -CIR recommended  Acute respiratory failure with hypoxia and hypercapnia -Secondary stroke -Intubated in ED>>>> 10/9 -High risk for reintubation  Tracheobronchitis vs PNA -Leukocytosis resolved -Afebrile overnight  -Neurology recommended completion of 5-day course of ceftriaxone 11/11>>> 11/16   Essential HTN****** -On admission BP 171/157 -Cleviprex drip -11/14 increase Hydralazine 50 mg QID -Lasix 20 mg daily -Labetalol PRN -Losartan 50 mg BID -Metoprolol 100 mg BID   HLD -Lipitor 80 mg daily (hold) in setting of acute hemorrhage. -11/7 TG= 654.  LDL =UTC, direct LDL= 121.8 -Resume statin at discharge  DM type II uncontrolled with Hyperglycemia -11/7 hemoglobin A1c= 8.6 -Lantus 50 units BID -NovoLog 8 units q 4 hr -Resistant SSI -11/13 consult diabetic coordinator; uncontrolled diabetic with stroke -11/13 consult nutrition; uncontrolled diabetic with stroke.  Educate on diet  Dysphagia -Continue tube feeds per core track  Morbidly obese -See diabetes  CKD stage IIIb (baseline Cr 1.2)  Lab Results  Component Value Date   CREATININE 1.17 (H) 01/11/2020   CREATININE 1.22 (H) 01/10/2020   CREATININE 1.37 (H) 01/09/2020   CREATININE 1.20 (H) 01/08/2020   CREATININE 1.26 (H) 01/07/2020  -At baseline    DVT prophylaxis: SCD Code Status: DNR Family Communication: 11/13 daughter at bedside discussed plan of care answered all questions Status is: Inpatient    Dispo: The patient is from: Home              Anticipated d/c is to: SNF?              Anticipated d/c date is: Per neurology              Patient currently unstable      Consultants:  Neurology PCCM    Procedures/Significant Events:  11/8 echocardiogram;FINDINGS  Left Ventricle: Left ventricular ejection fraction, by estimation, is 55  to 60%. The left ventricle has normal function. The left ventricle has no  regional wall motion abnormalities. Definity contrast agent was given IV  to delineate the left ventricular  endocardial borders. The left ventricular internal cavity size was normal  in size. There is no left ventricular hypertrophy. Left ventricular  diastolic parameters are consistent with Grade II diastolic dysfunction  (pseudonormalization). Elevated left  ventricular  end-diastolic pressure.   Right Ventricle: The right ventricular size is normal. No increase in  right ventricular wall thickness. Right ventricular systolic function is  normal.   Left Atrium: Left atrial size was mildly dilated.   Right Atrium: Right atrial size was normal in size.   Pericardium: There is no evidence of pericardial effusion.   Mitral Valve: The mitral valve is degenerative in appearance. There is  mild thickening of the mitral valve leaflet(s). There is mild  calcification of the mitral valve leaflet(s). Moderate mitral annular  calcification. Trivial mitral valve regurgitation.  No evidence of mitral valve stenosis.   Tricuspid Valve: The tricuspid valve is normal in structure. Tricuspid  valve regurgitation is not demonstrated. No evidence of tricuspid  stenosis.   Aortic Valve: The aortic valve is tricuspid. Aortic valve regurgitation is  not visualized. Mild aortic valve sclerosis is present, with no evidence  of aortic valve stenosis.   Pulmonic Valve: The pulmonic valve was normal in structure. Pulmonic valve  regurgitation is not visualized. No evidence of pulmonic stenosis.   Aorta: The aortic root is normal in size and structure.   Venous: The inferior vena cava is normal in size with greater than 50%  respiratory variability, suggesting right atrial pressure of 3 mmHg.   IAS/Shunts: No atrial level shunt detected by color flow Doppler.   I have personally reviewed and interpreted all radiology studies and my findings are as above.  VENTILATOR SETTINGS:    Cultures   Antimicrobials: Anti-infectives (From admission, onward)   Start     Ordered Stop   01/11/20 1000  cefTRIAXone (ROCEPHIN) 2 g in sodium chloride 0.9 % 100 mL IVPB        01/10/20 1128 01/13/20 0959   01/08/20 0900  cefTRIAXone (ROCEPHIN) 2 g in sodium chloride 0.9 % 100 mL IVPB  Status:  Discontinued        01/08/20 0801 01/10/20 1128       Devices    LINES / TUBES:       Continuous Infusions: . cefTRIAXone (ROCEPHIN)  IV 2 g (01/11/20 1042)  . feeding supplement (GLUCERNA 1.2 CAL) 1,000 mL (01/11/20 1043)     Objective: Vitals:   01/11/20 0850 01/11/20 0910 01/11/20 0940 01/11/20 1201  BP: (!) 168/68 (!) 163/55 (!) 150/52   Pulse:    72  Resp:    18  Temp:    98 F (36.7 C)  TempSrc:    Oral  SpO2:      Weight:      Height:        Intake/Output Summary (Last 24 hours) at 01/11/2020 1426 Last data filed at 01/11/2020 0006 Gross per 24 hour  Intake 120 ml  Output 1300 ml  Net -1180 ml   Filed Weights   01/04/20 1300 01/09/20 0426 01/10/20 0500  Weight: 105.8 kg 105.8 kg 104 kg    Examination:  General: A/O x4, No acute respiratory distress Eyes: negative scleral hemorrhage, negative anisocoria, negative icterus ENT: Negative Runny nose, negative gingival bleeding, Neck:  Negative scars, masses, torticollis, lymphadenopathy, JVD Lungs: Clear to auscultation bilaterally without wheezes or crackles Cardiovascular: Regular rate and rhythm without  murmur gallop or rub normal S1 and S2 Abdomen: MORBIDLY OBESE, negative abdominal pain, nondistended, positive soft, bowel sounds, no rebound, no ascites, no appreciable mass Extremities: No significant cyanosis, clubbing, or edema bilateral lower extremities Skin: Negative rashes, lesions, ulcers Psychiatric:  Negative depression, negative anxiety, negative fatigue, negative mania  Central nervous system:  Cranial nerves II through XII intact, tongue/uvula midline, LUE/LLE muscle strength 5/5, sensation intact throughout, RIGHT sided hemiparesis,  negative dysarthria, negative expressive aphasia, negative receptive aphasia.  .     Data Reviewed: Care during the described time interval was provided by me .  I have reviewed this patient's available data, including medical history, events of note, physical examination, and all test results as part of my evaluation.  CBC: Recent Labs   Lab 01/07/20 0718 01/07/20 0718 01/07/20 0814 01/08/20 0250 01/09/20 0304 01/10/20 0617 01/11/20 0109  WBC 11.5*  --   --  11.2* 9.1 9.7 11.2*  NEUTROABS  --   --   --   --  6.2  --  7.8*  HGB 14.3   < > 13.3 14.0 12.9 13.0 13.4  HCT 44.4   < > 39.0 44.2 40.6 41.4 42.6  MCV 91.4  --   --  92.3 94.0 93.9 93.2  PLT 184  --   --  182 192 211 221   < > = values in this interval not displayed.   Basic Metabolic Panel: Recent Labs  Lab 01/07/20 0116 01/07/20 0116 01/07/20 0718 01/07/20 0718 01/07/20 0814 01/08/20 0250 01/09/20 0304 01/10/20 0617 01/11/20 0109  NA 143   < > 143   < > 144 146* 147* 144 144  K 3.7   < > 3.3*   < > 3.3* 4.0 3.7 3.7 3.6  CL 102   < > 101  --   --  104 101 100 101  CO2 28   < > 32  --   --  28 34* 34* 34*  GLUCOSE 230*   < > 227*  --   --  181* 236* 123* 130*  BUN 34*   < > 32*  --   --  35* 40* 41* 38*  CREATININE 1.25*   < > 1.26*  --   --  1.20* 1.37* 1.22* 1.17*  CALCIUM 8.8*   < > 9.1  --   --  9.0 8.8* 8.4* 8.5*  MG 1.6*  --  1.7  --   --  2.0 1.9  --  2.2  PHOS 2.5  --  2.7  --   --  2.3* 4.0  --  3.5   < > = values in this interval not displayed.   GFR: Estimated Creatinine Clearance: 40.5 mL/min (A) (by C-G formula based on SCr of 1.17 mg/dL (H)). Liver Function Tests: Recent Labs  Lab 01/11/20 0109  AST 47*  ALT 45*  ALKPHOS 54  BILITOT 0.5  PROT 6.1*  ALBUMIN 2.4*   No results for input(s): LIPASE, AMYLASE in the last 168 hours. No results for input(s): AMMONIA in the last 168 hours. Coagulation Profile: No results for input(s): INR, PROTIME in the last 168 hours. Cardiac Enzymes: No results for input(s): CKTOTAL, CKMB, CKMBINDEX, TROPONINI in the last 168 hours. BNP (last 3 results) No results for input(s): PROBNP in the last 8760 hours. HbA1C: No results for input(s): HGBA1C in the last 72 hours. CBG: Recent Labs  Lab 01/10/20 2026 01/10/20 2348 01/11/20 0350 01/11/20 0756 01/11/20 1233  GLUCAP 129* 115*  131*  142* 178*   Lipid Profile: No results for input(s): CHOL, HDL, LDLCALC, TRIG, CHOLHDL, LDLDIRECT in the last 72 hours. Thyroid Function Tests: No results for input(s): TSH, T4TOTAL, FREET4, T3FREE, THYROIDAB in the last 72 hours. Anemia Panel: No results for input(s): VITAMINB12, FOLATE, FERRITIN, TIBC, IRON, RETICCTPCT in the last 72 hours. Sepsis Labs: No results for input(s): PROCALCITON, LATICACIDVEN in the last 168 hours.  Recent Results (from the past 240 hour(s))  Respiratory Panel by RT PCR (Flu A&B, Covid) - Nasopharyngeal Swab     Status: None   Collection Time: 01/04/20  1:32 PM   Specimen: Nasopharyngeal Swab  Result Value Ref Range Status   SARS Coronavirus 2 by RT PCR NEGATIVE NEGATIVE Final    Comment: (NOTE) SARS-CoV-2 target nucleic acids are NOT DETECTED.  The SARS-CoV-2 RNA is generally detectable in upper respiratoy specimens during the acute phase of infection. The lowest concentration of SARS-CoV-2 viral copies this assay can detect is 131 copies/mL. A negative result does not preclude SARS-Cov-2 infection and should not be used as the sole basis for treatment or other patient management decisions. A negative result may occur with  improper specimen collection/handling, submission of specimen other than nasopharyngeal swab, presence of viral mutation(s) within the areas targeted by this assay, and inadequate number of viral copies (<131 copies/mL). A negative result must be combined with clinical observations, patient history, and epidemiological information. The expected result is Negative.  Fact Sheet for Patients:  https://www.moore.com/  Fact Sheet for Healthcare Providers:  https://www.young.biz/  This test is no t yet approved or cleared by the [place] FDA and  has been authorized for detection and/or diagnosis of SARS-CoV-2 by FDA under an Emergency Use Authorization (EUA). This EUA will remain  in  effect (meaning this test can be used) for the duration of the COVID-19 declaration under Section 564(b)(1) of the Act, 21 U.S.C. section 360bbb-3(b)(1), unless the authorization is terminated or revoked sooner.     Influenza A by PCR NEGATIVE NEGATIVE Final   Influenza B by PCR NEGATIVE NEGATIVE Final    Comment: (NOTE) The Xpert Xpress SARS-CoV-2/FLU/RSV assay is intended as an aid in  the diagnosis of influenza from Nasopharyngeal swab specimens and  should not be used as a sole basis for treatment. Nasal washings and  aspirates are unacceptable for Xpert Xpress SARS-CoV-2/FLU/RSV  testing.  Fact Sheet for Patients: https://www.moore.com/  Fact Sheet for Healthcare Providers: https://www.young.biz/  This test is not yet approved or cleared by the [place] FDA and  has been authorized for detection and/or diagnosis of SARS-CoV-2 by  FDA under an Emergency Use Authorization (EUA). This EUA will remain  in effect (meaning this test can be used) for the duration of the  Covid-19 declaration under Section 564(b)(1) of the Act, 21  U.S.C. section 360bbb-3(b)(1), unless the authorization is  terminated or revoked. Performed at [facility], [address] [place], [place] [address]   MRSA PCR Screening     Status: None   Collection Time: 01/04/20  3:27 PM   Specimen: Nasal Mucosa; Nasopharyngeal  Result Value Ref Range Status   MRSA by PCR NEGATIVE NEGATIVE Final    Comment:        The GeneXpert MRSA Assay (FDA approved for NASAL specimens only), is one component of a comprehensive MRSA colonization surveillance program. It is not intended to diagnose MRSA infection nor to guide or monitor treatment for MRSA infections. Performed at [facility], [address] [place], [place] [address]   Culture, blood (routine x 2)     Status: None (Preliminary result)   Collection Time: 01/07/20  9:33 AM   Specimen: BLOOD LEFT  ARM  Result Value Ref Range Status   Specimen Description BLOOD LEFT ARM  Final   Special Requests   Final    BOTTLES DRAWN AEROBIC AND ANAEROBIC Blood Culture results may not be optimal due to an inadequate volume of blood received in culture bottles   Culture   Final    NO GROWTH 4 DAYS Performed at [facility], [address], [place], [place] [address]    Report Status PENDING  Incomplete  Culture, blood (routine x 2)     Status: None (Preliminary result)   Collection Time: 01/07/20  9:35 AM   Specimen: BLOOD  Result Value Ref Range Status   Specimen Description BLOOD LEFT ANTECUBITAL  Final   Special Requests   Final    BOTTLES DRAWN AEROBIC AND ANAEROBIC Blood Culture adequate volume   Culture   Final    NO GROWTH 4 DAYS Performed at [facility], [address], [place], [place] [address]    Report Status PENDING  Incomplete  Culture, respiratory (non-expectorated)     Status: None   Collection Time: 01/07/20 10:31 PM   Specimen: Tracheal Aspirate; Respiratory  Result Value Ref Range Status   Specimen Description TRACHEAL ASPIRATE  Final   Special Requests NONE  Final   Gram Stain   Final    RARE WBC PRESENT, PREDOMINANTLY PMN MODERATE SQUAMOUS EPITHELIAL CELLS PRESENT ABUNDANT GRAM POSITIVE COCCI ABUNDANT GRAM POSITIVE RODS RARE GRAM NEGATIVE RODS    Culture   Final    FEW Normal respiratory flora-no Staph aureus or Pseudomonas seen Performed at [facility], [address], [place], [place] [address]    Report Status 01/10/2020 FINAL  Final         Radiology Studies: No results found.      Scheduled Meds: . chlorhexidine  15 mL Mouth Rinse BID  . Chlorhexidine Gluconate Cloth  6 each Topical Daily  . docusate  100 mg Per Tube BID  . feeding supplement (PROSource TF)  45 mL Per Tube TID  . free water  200 mL Per Tube Q6H  . furosemide  20 mg Per Tube Daily  . heparin injection (subcutaneous)  5,000 Units Subcutaneous Q8H  .  hydrALAZINE  50 mg Per Tube Q8H  . hydrochlorothiazide  25 mg Per Tube Daily  . insulin aspart  0-20 Units Subcutaneous Q4H  . insulin aspart  8 Units Subcutaneous Q4H  . insulin glargine  50 Units Subcutaneous BID  . losartan  50 mg Per Tube BID  . mouth rinse  15 mL Mouth Rinse q12n4p  . metoprolol tartrate  100 mg Per Tube BID  . pantoprazole sodium  40 mg Per Tube Daily  . polyethylene glycol  17 g Per Tube Daily  . senna-docusate  1 tablet Per Tube BID   Continuous Infusions: . cefTRIAXone (ROCEPHIN)  IV 2 g (01/11/20 1042)  . feeding supplement (GLUCERNA 1.2 CAL) 1,000 mL (01/11/20 1043)     LOS: 7 days    Time spent:40 min    Najee Manninen, Roselind Messier, MD [facility] Pager [phone]  If 7PM-7AM, please contact night-coverage [email or web address] Password [email or web address] 01/11/2020, 2:26 PM

## 2020-01-12 ENCOUNTER — Inpatient Hospital Stay (HOSPITAL_COMMUNITY): Payer: Medicare Other

## 2020-01-12 DIAGNOSIS — N1831 Chronic kidney disease, stage 3a: Secondary | ICD-10-CM | POA: Diagnosis not present

## 2020-01-12 DIAGNOSIS — I611 Nontraumatic intracerebral hemorrhage in hemisphere, cortical: Secondary | ICD-10-CM | POA: Diagnosis not present

## 2020-01-12 DIAGNOSIS — I10 Essential (primary) hypertension: Secondary | ICD-10-CM | POA: Diagnosis not present

## 2020-01-12 LAB — CULTURE, BLOOD (ROUTINE X 2)
Culture: NO GROWTH
Culture: NO GROWTH
Special Requests: ADEQUATE

## 2020-01-12 LAB — CBC WITH DIFFERENTIAL/PLATELET
Abs Immature Granulocytes: 0.18 10*3/uL — ABNORMAL HIGH (ref 0.00–0.07)
Basophils Absolute: 0.1 10*3/uL (ref 0.0–0.1)
Basophils Relative: 1 %
Eosinophils Absolute: 0.3 10*3/uL (ref 0.0–0.5)
Eosinophils Relative: 2 %
HCT: 41.9 % (ref 36.0–46.0)
Hemoglobin: 13.5 g/dL (ref 12.0–15.0)
Immature Granulocytes: 2 %
Lymphocytes Relative: 16 %
Lymphs Abs: 1.8 10*3/uL (ref 0.7–4.0)
MCH: 29.8 pg (ref 26.0–34.0)
MCHC: 32.2 g/dL (ref 30.0–36.0)
MCV: 92.5 fL (ref 80.0–100.0)
Monocytes Absolute: 1.3 10*3/uL — ABNORMAL HIGH (ref 0.1–1.0)
Monocytes Relative: 11 %
Neutro Abs: 7.9 10*3/uL — ABNORMAL HIGH (ref 1.7–7.7)
Neutrophils Relative %: 68 %
Platelets: 251 10*3/uL (ref 150–400)
RBC: 4.53 MIL/uL (ref 3.87–5.11)
RDW: 14.4 % (ref 11.5–15.5)
WBC: 11.5 10*3/uL — ABNORMAL HIGH (ref 4.0–10.5)
nRBC: 0 % (ref 0.0–0.2)

## 2020-01-12 LAB — COMPREHENSIVE METABOLIC PANEL
ALT: 37 U/L (ref 0–44)
AST: 34 U/L (ref 15–41)
Albumin: 2.5 g/dL — ABNORMAL LOW (ref 3.5–5.0)
Alkaline Phosphatase: 54 U/L (ref 38–126)
Anion gap: 7 (ref 5–15)
BUN: 45 mg/dL — ABNORMAL HIGH (ref 8–23)
CO2: 33 mmol/L — ABNORMAL HIGH (ref 22–32)
Calcium: 8.3 mg/dL — ABNORMAL LOW (ref 8.9–10.3)
Chloride: 101 mmol/L (ref 98–111)
Creatinine, Ser: 1.28 mg/dL — ABNORMAL HIGH (ref 0.44–1.00)
GFR, Estimated: 41 mL/min — ABNORMAL LOW (ref >60)
Glucose, Bld: 125 mg/dL — ABNORMAL HIGH (ref 70–99)
Potassium: 3.9 mmol/L (ref 3.5–5.1)
Sodium: 141 mmol/L (ref 135–145)
Total Bilirubin: 0.4 mg/dL (ref 0.3–1.2)
Total Protein: 6.3 g/dL — ABNORMAL LOW (ref 6.5–8.1)

## 2020-01-12 LAB — GLUCOSE, CAPILLARY
Glucose-Capillary: 101 mg/dL — ABNORMAL HIGH (ref 70–99)
Glucose-Capillary: 174 mg/dL — ABNORMAL HIGH (ref 70–99)
Glucose-Capillary: 192 mg/dL — ABNORMAL HIGH (ref 70–99)
Glucose-Capillary: 167 mg/dL — ABNORMAL HIGH (ref 70–99)
Glucose-Capillary: 159 mg/dL — ABNORMAL HIGH (ref 70–99)

## 2020-01-12 LAB — PHOSPHORUS: Phosphorus: 4 mg/dL (ref 2.5–4.6)

## 2020-01-12 LAB — MAGNESIUM: Magnesium: 2.4 mg/dL (ref 1.7–2.4)

## 2020-01-12 MED ORDER — ENSURE ENLIVE PO LIQD
237.0000 mL | Freq: Two times a day (BID) | ORAL | Status: DC
  Administered 2020-01-13 – 2020-01-15 (×6): 237 mL via ORAL

## 2020-01-12 MED ORDER — GLUCERNA 1.2 CAL PO LIQD
960.0000 mL | ORAL | Status: DC
  Administered 2020-01-12 – 2020-01-15 (×4): 960 mL
  Filled 2020-01-12 (×7): qty 1000

## 2020-01-12 MED ORDER — HYDRALAZINE HCL 50 MG PO TABS
75.0000 mg | ORAL_TABLET | Freq: Four times a day (QID) | ORAL | Status: DC
  Administered 2020-01-13 (×2): 75 mg
  Filled 2020-01-12 (×3): qty 1

## 2020-01-12 MED ORDER — PROSOURCE TF PO LIQD
90.0000 mL | Freq: Two times a day (BID) | ORAL | Status: DC
  Administered 2020-01-13 – 2020-01-16 (×7): 90 mL
  Filled 2020-01-12 (×7): qty 90

## 2020-01-12 MED ORDER — LIVING WELL WITH DIABETES BOOK
Freq: Once | Status: AC
  Administered 2020-01-12: 1
  Filled 2020-01-12: qty 1

## 2020-01-12 MED ORDER — LABETALOL HCL 5 MG/ML IV SOLN
10.0000 mg | INTRAVENOUS | Status: DC | PRN
  Administered 2020-01-14: 10 mg via INTRAVENOUS
  Filled 2020-01-12: qty 4

## 2020-01-12 NOTE — Progress Notes (Signed)
Inpatient Rehab Admissions Coordinator:    I met with Pt. To continue discussion regarding potential CIR admission. Pt. Was more interactive today and her son, Yvone Neu, was present in room. I opened a case with pt.'s insurance today. Will continue to pursue for admit pending insurance authorization and medical readiness.   Clemens Catholic, [place], [place] Admissions Coordinator  [phone] ([place]) [phone] (office)

## 2020-01-12 NOTE — Progress Notes (Signed)
Modified Barium Swallow Progress Note  Patient Details  Name: [name] [name] MRN: [id number] Date of Birth: 1932/09/12  Today's Date: 01/12/2020  Modified Barium Swallow completed.  Full report located under Chart Review in the Imaging Section.  Brief recommendations include the following:  Clinical Impression  Patient presents with a moderate oral and a mild-mod pharyngeal dyspahgia without aspiration or penetration of tested boluses observed. Patient with delays in oral anterior to posterior transit of all tested boluses (thin liquids, nectar thick liquids, puree solids, regular solids, barium tablet and mechanical soft solids). This delay led to premature spillage to vallecular sinus with thin liquids. She was unable to maintain bolus cohesion of barium tablet and thin liquids or puree solids and unable to transit barium tablet from anterior portion of oral cavity. During pharyngeal phase of swallow, patient exhibited swallow initiation delays at level of vallecular sinus with thin liquids via straw and cup. Decreased epiglottic inflection but overall good airway closure during swallows. Nectar thick liquids resulted in trace vallecular residuals, puree solids resulted in trace to mild vallecular residuals and regular and mechanical soft solids resulted in mild-mod vallecular residuals. Majority of pharyngeal residuals cleared after subsequent swallows. Patient is safe to initiate oral diet of Dys 1, thin liquids if fully alert and with full supervision.   Swallow Evaluation Recommendations       SLP Diet Recommendations: Thin liquid;Dysphagia 1 (Puree) solids   Liquid Administration via: Cup;Straw   Medication Administration: Crushed with puree   Supervision: Patient able to self feed;Staff to assist with self feeding;Full supervision/cueing for compensatory strategies   Compensations: Minimize environmental distractions;Slow rate;Small sips/bites;Monitor for anterior loss;Lingual  sweep for clearance of pocketing       Oral Care Recommendations: Oral care BID       Angela Nevin, MA, CCC-SLP Speech Therapy [facility]

## 2020-01-12 NOTE — Progress Notes (Signed)
Occupational Therapy Treatment Patient Details Name: [name] [name] MRN: [id number] DOB: 05/13/1932 Today's Date: 01/12/2020    History of present illness The pt is an 86 yo female presenting with acute onset aphasia and R-sided weakness. Imaging revealed L basal ganglia/thalamic hemorrhage with 35mm midline shift and no intraventricular extension. The pt was intubated 11/7. PMH includes: DM II, HTN, CHF, and colon polyps. pt extubated 11/9.   OT comments  Pt seen in conjunction with PT to maximize pts activity tolerance and progress pt towards functional mobility goals. Pt making good progress towards OT goals as pt able to complete x4 sit<>stands with pt needing up to MOD A to power into standing. Pt needs assist to position RUE on stedy and needed only MIN A +2 to stand from seat of stedy. Worked on lateral weight shifts in stedy to facilitate WB'ing through BLEs as precursor to functional gait training. Worked on standing balance in stedy with pt needing up to MOD A for static standing but did have periods of MIN A to maintain upright posture. Pt continues to be an excellent CIR candidate d/t pts strong family support ( son present during session, very helpful and supportive), progression with therapies and pts continued motivation to reach OT goal. DC plan remains appropriate, will follow acutely per POC.   Follow Up Recommendations  CIR;Supervision/Assistance - 24 hour    Equipment Recommendations  Other (comment) (TBD next venue)    Recommendations for Other Services      Precautions / Restrictions Precautions Precautions: Fall;Other (comment) Precaution Comments: R-sided hemiparesis, NG tube Restrictions Weight Bearing Restrictions: No       Mobility Bed Mobility Overal bed mobility: Needs Assistance Bed Mobility: Rolling;Sidelying to Sit;Sit to Sidelying Rolling: Max assist Sidelying to sit: Max assist;+2 for physical assistance     Sit to sidelying: Total assist;+2 for  physical assistance General bed mobility comments: Cues to reach for rail with LUE and to push through left heel to scoot bottom, assist with RLE, bottom and elevating trunk to get to EOB. Assist lowering trunk and with BLEs to return to sidelying.  Transfers Overall transfer level: Needs assistance Equipment used: Ambulation equipment used Transfers: Sit to/from Stand Sit to Stand: Mod assist;+2 safety/equipment;From elevated surface;Min assist         General transfer comment: Assist of 2 to stand from elevated bed height with therapist manually placing RUE on stedy bar; use of pad to elevate hips and cues for upright posture. Performed x1 from EOB, x3 from stedy flaps only requiring Min A.    Balance Overall balance assessment: Needs assistance Sitting-balance support: Feet supported;No upper extremity supported Sitting balance-Leahy Scale: Fair Sitting balance - Comments: Requires Min-Min guard assist for sitting balance with cues for upright posture, posterior lean at times. Fatigues towards end of session requiring total A. Postural control: Posterior lean Standing balance support: During functional activity Standing balance-Leahy Scale: Poor Standing balance comment: Worked on weight shifting in stedy with emphasis on WB through RUE/LE and finding midline/upright, Pt favors leaning right but able to correct with cues.                           ADL either performed or assessed with clinical judgement   ADL Overall ADL's : Needs assistance/impaired     Grooming: Wash/dry face;Set up;Supervision/safety;Sitting Grooming Details (indicate cue type and reason): pt able to wash face with LUE from bed level  Lower Body Dressing: Total assistance;Bed level Lower Body Dressing Details (indicate cue type and reason): to don socks Toilet Transfer: Moderate assistance;+2 for physical assistance Toilet Transfer Details (indicate cue type and reason): simulated  via functional mobility with stedy- MODA  +2 to stand to stedy         Functional mobility during ADLs: Moderate assistance;+2 for physical assistance;Maximal assistance (MAX A +2 bed mobility; MOD A +2 sit<>stand) General ADL Comments: pt able to progress functional mobility this session with pt able to complete x4 sit<>stands with MIN- MOD A +2. pt continues to present with R sided weakness  and decreased activity tolerance however pt continues to be an excellent CIR candidate d/t pts strong family support and continued progression wtih therapies     Vision   Vision Assessment?: Vision impaired- to be further tested in functional context Additional Comments: pt continues to present with L gaze preference however pt will track to R with cues   Perception     Praxis      Cognition Arousal/Alertness: Awake/alert Behavior During Therapy: WFL for tasks assessed/performed Overall Cognitive Status: Impaired/Different from baseline Area of Impairment: Orientation;Problem solving                 Orientation Level: Disoriented to;Time;Situation           Problem Solving: Decreased initiation;Difficulty sequencing;Requires verbal cues;Requires tactile cues General Comments: Stated it was September but knew it was Monday. follows simple commands with increase time and repetition. "let me try to do it." very motivated        Exercises Other Exercises Other Exercises: son reports continued work with pts RUE via PROM   Shoulder Instructions       General Comments Pt's son Rocky Link, present for session. Edema present right hand/RUE.    Pertinent Vitals/ Pain       Pain Assessment: No/denies pain Faces Pain Scale: No hurt  Home Living                                          Prior Functioning/Environment              Frequency  Min 2X/week        Progress Toward Goals  OT Goals(current goals can now be found in the care plan section)  Progress  towards OT goals: Progressing toward goals  Acute Rehab OT Goals Patient Stated Goal: to get out of here OT Goal Formulation: With patient/family Time For Goal Achievement: 01/19/20 Potential to Achieve Goals: Good  Plan Discharge plan remains appropriate;Frequency remains appropriate    Co-evaluation    PT/OT/SLP Co-Evaluation/Treatment: Yes Reason for Co-Treatment: Complexity of the patient's impairments (multi-system involvement);For patient/therapist safety;To address functional/ADL transfers PT goals addressed during session: Mobility/safety with mobility;Balance;Strengthening/ROM OT goals addressed during session: ADL's and self-care      AM-PAC OT "6 Clicks" Daily Activity     Outcome Measure   Help from another person eating meals?: Total (NG) Help from another person taking care of personal grooming?: Total Help from another person toileting, which includes using toliet, bedpan, or urinal?: Total Help from another person bathing (including washing, rinsing, drying)?: Total Help from another person to put on and taking off regular upper body clothing?: Total Help from another person to put on and taking off regular lower body clothing?: Total 6 Click Score: 6    End of  Session Equipment Utilized During Treatment: Gait belt;Other (comment) (stedy)  OT Visit Diagnosis: Muscle weakness (generalized) (M62.81);Other abnormalities of gait and mobility (R26.89);Other symptoms and signs involving cognitive function;Hemiplegia and hemiparesis;Low vision, both eyes (H54.2) Hemiplegia - Right/Left: Right Hemiplegia - dominant/non-dominant: Dominant   Activity Tolerance Patient tolerated treatment well   Patient Left in bed;with call bell/phone within reach;with bed alarm set   Nurse Communication Mobility status        Time: 1101-1130 OT Time Calculation (min): 29 min  Charges: OT General Charges $OT Visit: 1 Visit OT Treatments $Therapeutic Activity: 8-22  mins  Audery Amel., COTA/L Acute Rehabilitation Services [phone]    Angelina Pih 01/12/2020, 1:30 PM

## 2020-01-12 NOTE — Care Management Important Message (Signed)
Important Message  Patient Details  Name: [name] [name] MRN: [id number] Date of Birth: 07-19-1932   Medicare Important Message Given:  Yes     Emelda Kohlbeck 01/12/2020, 3:21 PM

## 2020-01-12 NOTE — Progress Notes (Signed)
Nutrition Follow-up  DOCUMENTATION CODES:   Obesity unspecified  INTERVENTION:  Provide Ensure Enlive po BID, each supplement provides 350 kcal and 20 grams of protein.  Encourage PO intake during the day.   Transition to nocturnal tube feeds via Cortrak NGT using Glucerna 1.2 cal formula at goal rate of 80 ml/hr x 12 hours (7pm-7am).  Provide 90 ml Prosource TF BID per tube.   Tube feeding regimen provides 1312 kcal (77% of kcal needs), 102 grams of protein (100% of protein needs), and 778 ml free water.  NUTRITION DIAGNOSIS:   Inadequate oral intake related to inability to eat as evidenced by NPO status; diet advanced; progressing  GOAL:   Patient will meet greater than or equal to 90% of their needs; progressing  MONITOR:   PO intake, Supplement acceptance, Diet advancement, Skin, Weight trends, Labs, I & O's, TF tolerance  REASON FOR ASSESSMENT:   Consult, Ventilator Enteral/tube feeding initiation and management  ASSESSMENT:   Pt with PMH of DM and HTN admitted with L basal ganglia/thalamic intraparenchymal hemorrhage with 2 mm midline shift. Pt intubated for airway protection.  11/8 Cortrak placed, tip of tube in stomach 11/9 extubated  Pt underwent MBS today. Diet has been advanced to a dysphagia 1 diet with thin liquids. Family at bedside hopeful po intake to continue to improve and supportive of patient's needs. RD to order nutritional supplements to aid in po intake. Pt currently with continuous tube feeds ordered and has been receiving them. As diet has been advanced, RD to modify tube feeding orders and transition to nocturnal tube feeds to ensure adequate nutrition is met while encouraging PO intake during the day.   Labs and medications reviewed.   Diet Order:   Diet Order            DIET - DYS 1 Room service appropriate? Yes with Assist; Fluid consistency: Thin  Diet effective now                 EDUCATION NEEDS:   Not appropriate for education  at this time  Skin:  Skin Assessment: Reviewed RN Assessment  Last BM:  11/15  Height:   Ht Readings from Last 1 Encounters:  01/04/20 $RemoveB'5\' 3"'KGLfWsUl$  (1.6 m)    Weight:   Wt Readings from Last 1 Encounters:  01/10/20 104 kg   Ideal Body Weight:  52.2 kg  BMI:  Body mass index is 40.61 kg/m.  Estimated Nutritional Needs:   Kcal:  1700-1900  Protein:  100-120 grams  Fluid:  > 1.7 L/day  Corrin Parker, MS, RD, LDN RD pager number/after hours weekend pager number on Amion.

## 2020-01-12 NOTE — Progress Notes (Signed)
PROGRESS NOTE    [name] [name]  QAS:[id number] DOB: 03-14-1932 DOA: 01/04/2020 PCP: [name], MD     Brief Narrative:  86 yo WF PMHx DM type II uncontrolled with complication, DM retinopathy both eyes, Chronic Diastolic CHF, HTN, HLD, CKD stage IIIb, pancreatitis, Hx colonic polyps  with a chief complaint of acute onset aphasia and right-sided weakness.  This started while she was at church and occurred less than an hour ago.  Patient arrived as a code stroke.  Level 5 caveat acuity of condition.   Subjective: 11/15 afebrile overnight A/O x4, states she is hungry wants to eat.    Assessment & Plan: Covid vaccination; vaccinated   Active Problems:   ICH (intracerebral hemorrhage) (HCC)   Morbid obesity (HCC)   Chronic diastolic congestive heart failure (HCC)   Uncontrolled type 2 diabetes mellitus with hyperglycemia (HCC)   Essential hypertension   FUO (fever of unknown origin)   CKD (chronic kidney disease), stage III (HCC)   LEFT basal ganglia hemorrhagic CVA -Secondary to HTN -Code Stroke CT head L thalamic and basal ganglia IPH w/ local mass effect and partial effacement 3rd ventricle w/ 28mm midline shift. Small vessel disease. Atrophy. Old pontine lacune. Anterior R frontal lobe meningioma vs calcification.  -CT head after intubation unchanged -MRI  No underlying basal ganglia lesion. Extensive small vessel disease.  -MRA  Intracranial atherosclerosis.  -Carotid Doppler bilateral 1-39% carotid stenosis. -2D Echo ejection fraction 55 to 60%. -LDL UTC as TG > 400 mg/dL, direct LDL 798.9  -QJJH4R 8.6 -ASA 81 mg daily -CIR recommended  Acute respiratory failure with hypoxia and hypercapnia -Secondary stroke -Intubated in ED>>>> 10/9 -High risk for reintubation  Tracheobronchitis vs PNA -Leukocytosis resolved -Afebrile overnight  -Neurology recommended completion of 5-day course of ceftriaxone 11/11>>> 11/16  Essential HTN -On admission BP  171/157 -Cleviprex drip -11/15 increase Hydralazine 75 mg QID -Hydrochlorothiazide 25 mg daily -Lasix 20 mg daily -Labetalol PRN -Losartan 50 mg BID -Metoprolol 100 mg BID   HLD -Lipitor 80 mg daily (hold) in setting of acute hemorrhage. -11/7 TG= 654.  LDL =UTC, direct LDL= 121.8 -Resume statin at discharge  DM type II uncontrolled with Hyperglycemia -11/7 hemoglobin A1c= 8.6 -Lantus 50 units BID -NovoLog 8 units q 4 hr -Resistant SSI -11/13 consult diabetic coordinator; uncontrolled diabetic with stroke -11/13 consult nutrition; uncontrolled diabetic with stroke.  Educate on diet  Dysphagia -11/15 patient passed MBS dysphagia 1 diet.  Continue core track until patient can show that she can consume dinner and breakfast, and swallow effectively any PO medication then discontinue core track tube -Continue tube feeds per core track  Morbidly obese -See diabetes  CKD stage IIIb (baseline Cr 1.2)  Lab Results  Component Value Date   CREATININE 1.28 (H) 01/12/2020   CREATININE 1.17 (H) 01/11/2020   CREATININE 1.22 (H) 01/10/2020   CREATININE 1.37 (H) 01/09/2020   CREATININE 1.20 (H) 01/08/2020  -At baseline    DVT prophylaxis: SCD Code Status: DNR Family Communication: 11/13 daughter at bedside discussed plan of care answered all questions Status is: Inpatient    Dispo: The patient is from: Home              Anticipated d/c is to: SNF?              Anticipated d/c date is: Per neurology              Patient currently unstable      Consultants:  Neurology PCCM  Procedures/Significant Events:  11/8 echocardiogram;LVEF= 55 to 60%.  -Grade II diastolic dysfunction    I have personally reviewed and interpreted all radiology studies and my findings are as above.  VENTILATOR SETTINGS:    Cultures   Antimicrobials: Anti-infectives (From admission, onward)   Start     Ordered Stop   01/11/20 1000  cefTRIAXone (ROCEPHIN) 2 g in sodium chloride 0.9  % 100 mL IVPB        01/10/20 1128 01/13/20 0959   01/08/20 0900  cefTRIAXone (ROCEPHIN) 2 g in sodium chloride 0.9 % 100 mL IVPB  Status:  Discontinued        01/08/20 0801 01/10/20 1128       Devices    LINES / TUBES:      Continuous Infusions: . feeding supplement (GLUCERNA 1.2 CAL) 60 mL/hr at 01/12/20 0700     Objective: Vitals:   01/12/20 0021 01/12/20 0508 01/12/20 0753 01/12/20 1217  BP: (!) 167/56 (!) 181/63 (!) 171/74 (!) 133/45  Pulse: 75 73 77 69  Resp: 20 20 18 18   Temp: 98 F (36.7 C) 98.2 F (36.8 C) 98.3 F (36.8 C) 98.2 F (36.8 C)  TempSrc: Oral Oral Oral Oral  SpO2: 93% 97% 95% 95%  Weight:      Height:        Intake/Output Summary (Last 24 hours) at 01/12/2020 1553 Last data filed at 01/12/2020 1400 Gross per 24 hour  Intake 100 ml  Output --  Net 100 ml   Filed Weights   01/04/20 1300 01/09/20 0426 01/10/20 0500  Weight: 105.8 kg 105.8 kg 104 kg    Examination:  General: A/O x4, No acute respiratory distress Eyes: negative scleral hemorrhage, negative anisocoria, negative icterus ENT: Negative Runny nose, negative gingival bleeding, Neck:  Negative scars, masses, torticollis, lymphadenopathy, JVD Lungs: Clear to auscultation bilaterally without wheezes or crackles Cardiovascular: Regular rate and rhythm without murmur gallop or rub normal S1 and S2 Abdomen: MORBIDLY OBESE, negative abdominal pain, nondistended, positive soft, bowel sounds, no rebound, no ascites, no appreciable mass Extremities: No significant cyanosis, clubbing, or edema bilateral lower extremities Skin: Negative rashes, lesions, ulcers Psychiatric:  Negative depression, negative anxiety, negative fatigue, negative mania  Central nervous system:  Cranial nerves II through XII intact, tongue/uvula midline, LUE/LLE muscle strength 5/5, sensation intact throughout, RIGHT sided hemiparesis,  negative dysarthria, negative expressive aphasia, negative receptive  aphasia.  .     Data Reviewed: Care during the described time interval was provided by me .  I have reviewed this patient's available data, including medical history, events of note, physical examination, and all test results as part of my evaluation.  CBC: Recent Labs  Lab 01/08/20 0250 01/09/20 0304 01/10/20 0617 01/11/20 0109 01/12/20 0448  WBC 11.2* 9.1 9.7 11.2* 11.5*  NEUTROABS  --  6.2  --  7.8* 7.9*  HGB 14.0 12.9 13.0 13.4 13.5  HCT 44.2 40.6 41.4 42.6 41.9  MCV 92.3 94.0 93.9 93.2 92.5  PLT 182 192 211 221 251   Basic Metabolic Panel: Recent Labs  Lab 01/07/20 0718 01/07/20 0814 01/08/20 0250 01/09/20 0304 01/10/20 0617 01/11/20 0109 01/12/20 0448  NA 143   < > 146* 147* 144 144 141  K 3.3*   < > 4.0 3.7 3.7 3.6 3.9  CL 101   < > 104 101 100 101 101  CO2 32   < > 28 34* 34* 34* 33*  GLUCOSE 227*   < > 181* 236* 123* 130*  125*  BUN 32*   < > 35* 40* 41* 38* 45*  CREATININE 1.26*   < > 1.20* 1.37* 1.22* 1.17* 1.28*  CALCIUM 9.1   < > 9.0 8.8* 8.4* 8.5* 8.3*  MG 1.7  --  2.0 1.9  --  2.2 2.4  PHOS 2.7  --  2.3* 4.0  --  3.5 4.0   < > = values in this interval not displayed.   GFR: Estimated Creatinine Clearance: 37 mL/min (A) (by C-G formula based on SCr of 1.28 mg/dL (H)). Liver Function Tests: Recent Labs  Lab 01/11/20 0109 01/12/20 0448  AST 47* 34  ALT 45* 37  ALKPHOS 54 54  BILITOT 0.5 0.4  PROT 6.1* 6.3*  ALBUMIN 2.4* 2.5*   No results for input(s): LIPASE, AMYLASE in the last 168 hours. No results for input(s): AMMONIA in the last 168 hours. Coagulation Profile: No results for input(s): INR, PROTIME in the last 168 hours. Cardiac Enzymes: No results for input(s): CKTOTAL, CKMB, CKMBINDEX, TROPONINI in the last 168 hours. BNP (last 3 results) No results for input(s): PROBNP in the last 8760 hours. HbA1C: No results for input(s): HGBA1C in the last 72 hours. CBG: Recent Labs  Lab 01/11/20 2008 01/11/20 2322 01/12/20 0416  01/12/20 0844 01/12/20 1218  GLUCAP 150* 142* 101* 174* 192*   Lipid Profile: No results for input(s): CHOL, HDL, LDLCALC, TRIG, CHOLHDL, LDLDIRECT in the last 72 hours. Thyroid Function Tests: No results for input(s): TSH, T4TOTAL, FREET4, T3FREE, THYROIDAB in the last 72 hours. Anemia Panel: No results for input(s): VITAMINB12, FOLATE, FERRITIN, TIBC, IRON, RETICCTPCT in the last 72 hours. Sepsis Labs: No results for input(s): PROCALCITON, LATICACIDVEN in the last 168 hours.  Recent Results (from the past 240 hour(s))  Respiratory Panel by RT PCR (Flu A&B, Covid) - Nasopharyngeal Swab     Status: None   Collection Time: 01/04/20  1:32 PM   Specimen: Nasopharyngeal Swab  Result Value Ref Range Status   SARS Coronavirus 2 by RT PCR NEGATIVE NEGATIVE Final    Comment: (NOTE) SARS-CoV-2 target nucleic acids are NOT DETECTED.  The SARS-CoV-2 RNA is generally detectable in upper respiratoy specimens during the acute phase of infection. The lowest concentration of SARS-CoV-2 viral copies this assay can detect is 131 copies/mL. A negative result does not preclude SARS-Cov-2 infection and should not be used as the sole basis for treatment or other patient management decisions. A negative result may occur with  improper specimen collection/handling, submission of specimen other than nasopharyngeal swab, presence of viral mutation(s) within the areas targeted by this assay, and inadequate number of viral copies (<131 copies/mL). A negative result must be combined with clinical observations, patient history, and epidemiological information. The expected result is Negative.  Fact Sheet for Patients:  https://www.moore.com/  Fact Sheet for Healthcare Providers:  https://www.young.biz/  This test is no t yet approved or cleared by the [place] FDA and  has been authorized for detection and/or diagnosis of SARS-CoV-2 by FDA under an Emergency  Use Authorization (EUA). This EUA will remain  in effect (meaning this test can be used) for the duration of the COVID-19 declaration under Section 564(b)(1) of the Act, 21 U.S.C. section 360bbb-3(b)(1), unless the authorization is terminated or revoked sooner.     Influenza A by PCR NEGATIVE NEGATIVE Final   Influenza B by PCR NEGATIVE NEGATIVE Final    Comment: (NOTE) The Xpert Xpress SARS-CoV-2/FLU/RSV assay is intended as an aid in  the diagnosis of influenza  from Nasopharyngeal swab specimens and  should not be used as a sole basis for treatment. Nasal washings and  aspirates are unacceptable for Xpert Xpress SARS-CoV-2/FLU/RSV  testing.  Fact Sheet for Patients: https://www.moore.com/  Fact Sheet for Healthcare Providers: https://www.young.biz/  This test is not yet approved or cleared by the [place] FDA and  has been authorized for detection and/or diagnosis of SARS-CoV-2 by  FDA under an Emergency Use Authorization (EUA). This EUA will remain  in effect (meaning this test can be used) for the duration of the  Covid-19 declaration under Section 564(b)(1) of the Act, 21  U.S.C. section 360bbb-3(b)(1), unless the authorization is  terminated or revoked. Performed at [facility], [address] [place], [place] [address]   MRSA PCR Screening     Status: None   Collection Time: 01/04/20  3:27 PM   Specimen: Nasal Mucosa; Nasopharyngeal  Result Value Ref Range Status   MRSA by PCR NEGATIVE NEGATIVE Final    Comment:        The GeneXpert MRSA Assay (FDA approved for NASAL specimens only), is one component of a comprehensive MRSA colonization surveillance program. It is not intended to diagnose MRSA infection nor to guide or monitor treatment for MRSA infections. Performed at [facility], [address], [place], [place] [address]   Culture, blood (routine x 2)     Status: None   Collection Time: 01/07/20   9:33 AM   Specimen: BLOOD LEFT ARM  Result Value Ref Range Status   Specimen Description BLOOD LEFT ARM  Final   Special Requests   Final    BOTTLES DRAWN AEROBIC AND ANAEROBIC Blood Culture results may not be optimal due to an inadequate volume of blood received in culture bottles   Culture   Final    NO GROWTH 5 DAYS Performed at [facility], [address], [place], [place] [address]    Report Status 01/12/2020 FINAL  Final  Culture, blood (routine x 2)     Status: None   Collection Time: 01/07/20  9:35 AM   Specimen: BLOOD  Result Value Ref Range Status   Specimen Description BLOOD LEFT ANTECUBITAL  Final   Special Requests   Final    BOTTLES DRAWN AEROBIC AND ANAEROBIC Blood Culture adequate volume   Culture   Final    NO GROWTH 5 DAYS Performed at [facility], [address] [place], [place] [address]    Report Status 01/12/2020 FINAL  Final  Culture, respiratory (non-expectorated)     Status: None   Collection Time: 01/07/20 10:31 PM   Specimen: Tracheal Aspirate; Respiratory  Result Value Ref Range Status   Specimen Description TRACHEAL ASPIRATE  Final   Special Requests NONE  Final   Gram Stain   Final    RARE WBC PRESENT, PREDOMINANTLY PMN MODERATE SQUAMOUS EPITHELIAL CELLS PRESENT ABUNDANT GRAM POSITIVE COCCI ABUNDANT GRAM POSITIVE RODS RARE GRAM NEGATIVE RODS    Culture   Final    FEW Normal respiratory flora-no Staph aureus or Pseudomonas seen Performed at [facility], [address], [place], [place] [address]    Report Status 01/10/2020 FINAL  Final         Radiology Studies: No results found.      Scheduled Meds: . chlorhexidine  15 mL Mouth Rinse BID  . Chlorhexidine Gluconate Cloth  6 each Topical Daily  . docusate  100 mg Per Tube BID  . feeding  supplement (PROSource TF)  45 mL Per Tube TID  . free water  200 mL Per Tube Q6H  . furosemide  20 mg Per Tube Daily  . heparin injection (subcutaneous)  5,000 Units  Subcutaneous Q8H  . hydrALAZINE  50 mg Per Tube Q6H  . hydrochlorothiazide  25 mg Per Tube Daily  . insulin aspart  0-20 Units Subcutaneous Q4H  . insulin aspart  8 Units Subcutaneous Q4H  . insulin glargine  50 Units Subcutaneous BID  . losartan  50 mg Per Tube BID  . mouth rinse  15 mL Mouth Rinse q12n4p  . metoprolol tartrate  100 mg Per Tube BID  . pantoprazole sodium  40 mg Per Tube Daily  . polyethylene glycol  17 g Per Tube Daily  . senna-docusate  1 tablet Per Tube BID   Continuous Infusions: . feeding supplement (GLUCERNA 1.2 CAL) 60 mL/hr at 01/12/20 0700     LOS: 8 days    Time spent:40 min    Tannis Burstein, Roselind Messier, MD [facility] Pager [phone]  If 7PM-7AM, please contact night-coverage [email or web address] Password [facility] 01/12/2020, 3:53 PM

## 2020-01-12 NOTE — Progress Notes (Signed)
  Speech Language Pathology Treatment: Dysphagia  Patient Details Name: [name] [name] MRN: [id number] DOB: 1932-06-16 Today's Date: 01/12/2020 Time: [id number] SLP Time Calculation (min) (ACUTE ONLY): 15 min  Assessment / Plan / Recommendation Clinical Impression  Patient seen with son present in room to address dysphagia goals and determine readiness for PO's/objecitive swallow study. Per son and review of recent notes, patient is much more alert today, able to feed self with spoon for applesauce, did not exhibit anterior spillage or leakage that was observed on Friday (11/12) ST session. Patient did exhibit immediate and delayed coughing after PO's of puree (applesauce) and nectar thick liquids. Voice remained clear. SLP is recommending to proceed with MBS today to determine readiness for PO's.    HPI HPI: The pt is an 86 yo female presenting with acute onset aphasia and R-sided weakness. Imaging revealed L basal ganglia/thalamic hemorrhage with 50mm midline shift and no intraventricular extension. The pt was intubated 11/7-11/9. PMH includes: DM II, HTN, CHF, and colon polyps.       SLP Plan  Continue with current plan of care;MBS       Recommendations  Diet recommendations: NPO Medication Administration: Via alternative means                SLP Visit Diagnosis: Dysphagia, unspecified (R13.10) Plan: Continue with current plan of care;MBS       GO               Angela Nevin, MA, CCC-SLP Speech Therapy [facility]

## 2020-01-12 NOTE — Procedures (Deleted)
Objective Swallowing Evaluation: Type of Study: Bedside Swallow Evaluation   Patient Details  Name: [name] [name] MRN: [id number] Date of Birth: 28-Jan-1933  Today's Date: 01/12/2020 Time: SLP Start Time (ACUTE ONLY): 1340 -SLP Stop Time (ACUTE ONLY): 1400  SLP Time Calculation (min) (ACUTE ONLY): 20 min   Past Medical History:  Past Medical History:  Diagnosis Date  . Colon polyp   . Diabetes mellitus 1992  . Hypertension 1996  . Pancreatitis    Past Surgical History:  Past Surgical History:  Procedure Laterality Date  . CHOLECYSTECTOMY    . COLONOSCOPY    . ERCP    . PARTIAL HYSTERECTOMY  86years old  . POLYPECTOMY    . UPPER GASTROINTESTINAL ENDOSCOPY    . WRIST FRACTURE SURGERY  left arm,86years old   HPI: The pt is an 86 yo female presenting with acute onset aphasia and R-sided weakness. Imaging revealed L basal ganglia/thalamic hemorrhage with 38mm midline shift and no intraventricular extension. The pt was intubated 11/7-11/9. PMH includes: DM II, HTN, CHF, and colon polyps.    Subjective: pleasant, alert, cooperative    Assessment / Plan / Recommendation  CHL IP CLINICAL IMPRESSIONS 01/12/2020  Clinical Impression Patient presents with a moderate oral and a mild-mod pharyngeal dyspahgia without aspiration or penetration of tested boluses observed. Patient with delays in oral anterior to posterior transit of all tested boluses (thin liquids, nectar thick liquids, puree solids, regular solids, barium tablet and mechanical soft solids). This delay led to premature spillage to vallecular sinus with thin liquids. She was unable to maintain bolus cohesion of barium tablet and thin liquids or puree solids and unable to transit barium tablet from anterior portion of oral cavity. During pharyngeal phase of swallow, patient exhibited swallow initiation delays at level of vallecular sinus with thin liquids via straw and cup. Decreased epiglottic inflection but overall good  airway closure during swallows. Nectar thick liquids resulted in trace vallecular residuals, puree solids resulted in trace to mild vallecular residuals and regular and mechanical soft solids resulted in mild-mod vallecular residuals. Majority of pharyngeal residuals cleared after subsequent swallows. Patient is safe to initiate oral diet of Dys 1, thin liquids if fully alert and with full supervision.  SLP Visit Diagnosis Dysphagia, oropharyngeal phase (R13.12)  Attention and concentration deficit following --  Frontal lobe and executive function deficit following --  Impact on safety and function Mild aspiration risk      CHL IP TREATMENT RECOMMENDATION 01/12/2020  Treatment Recommendations Therapy as outlined in treatment plan below     Prognosis 01/07/2020  Prognosis for Safe Diet Advancement Good  Barriers to Reach Goals Cognitive deficits;Language deficits;Severity of deficits  Barriers/Prognosis Comment --    CHL IP DIET RECOMMENDATION 01/12/2020  SLP Diet Recommendations Thin liquid;Dysphagia 1 (Puree) solids  Liquid Administration via Cup;Straw  Medication Administration Crushed with puree  Compensations Minimize environmental distractions;Slow rate;Small sips/bites;Monitor for anterior loss;Lingual sweep for clearance of pocketing  Postural Changes --      CHL IP OTHER RECOMMENDATIONS 01/12/2020  Recommended Consults --  Oral Care Recommendations Oral care BID  Other Recommendations --      CHL IP FOLLOW UP RECOMMENDATIONS 01/12/2020  Follow up Recommendations Inpatient Rehab      CHL IP FREQUENCY AND DURATION 01/12/2020  Speech Therapy Frequency (ACUTE ONLY) min 2x/week  Treatment Duration 2 weeks           CHL IP ORAL PHASE 01/12/2020  Oral Phase Impaired  Oral - Pudding Teaspoon --  Oral - Pudding Cup --  Oral - Honey Teaspoon --  Oral - Honey Cup --  Oral - Nectar Teaspoon --  Oral - Nectar Cup Weak lingual manipulation;Reduced posterior  propulsion;Delayed oral transit  Oral - Nectar Straw --  Oral - Thin Teaspoon --  Oral - Thin Cup Premature spillage;Weak lingual manipulation;Delayed oral transit;Reduced posterior propulsion  Oral - Thin Straw Weak lingual manipulation;Delayed oral transit;Reduced posterior propulsion;Premature spillage  Oral - Puree Weak lingual manipulation;Delayed oral transit;Reduced posterior propulsion  Oral - Mech Soft Impaired mastication;Weak lingual manipulation;Delayed oral transit;Reduced posterior propulsion  Oral - Regular Reduced posterior propulsion;Delayed oral transit;Impaired mastication;Weak lingual manipulation  Oral - Multi-Consistency --  Oral - Pill Decreased bolus cohesion;Delayed oral transit;Reduced posterior propulsion  Oral Phase - Comment --    CHL IP PHARYNGEAL PHASE 01/12/2020  Pharyngeal Phase Impaired  Pharyngeal- Pudding Teaspoon --  Pharyngeal --  Pharyngeal- Pudding Cup --  Pharyngeal --  Pharyngeal- Honey Teaspoon --  Pharyngeal --  Pharyngeal- Honey Cup --  Pharyngeal --  Pharyngeal- Nectar Teaspoon --  Pharyngeal --  Pharyngeal- Nectar Cup Pharyngeal residue - valleculae;Reduced epiglottic inversion  Pharyngeal --  Pharyngeal- Nectar Straw --  Pharyngeal --  Pharyngeal- Thin Teaspoon --  Pharyngeal --  Pharyngeal- Thin Cup Delayed swallow initiation-vallecula;Reduced epiglottic inversion;Pharyngeal residue - valleculae;Pharyngeal residue - pyriform;Lateral channel residue  Pharyngeal --  Pharyngeal- Thin Straw Delayed swallow initiation-vallecula;Reduced epiglottic inversion;Pharyngeal residue - valleculae;Pharyngeal residue - pyriform  Pharyngeal --  Pharyngeal- Puree Pharyngeal residue - valleculae  Pharyngeal --  Pharyngeal- Mechanical Soft Delayed swallow initiation-vallecula  Pharyngeal --  Pharyngeal- Regular Delayed swallow initiation-vallecula  Pharyngeal --  Pharyngeal- Multi-consistency --  Pharyngeal --  Pharyngeal- Pill (No Data)   Pharyngeal --  Pharyngeal Comment --     CHL IP CERVICAL ESOPHAGEAL PHASE 01/12/2020  Cervical Esophageal Phase WFL  Pudding Teaspoon --  Pudding Cup --  Honey Teaspoon --  Honey Cup --  Nectar Teaspoon --  Nectar Cup --  Nectar Straw --  Thin Teaspoon --  Thin Cup --  Thin Straw --  Puree --  Mechanical Soft --  Regular --  Multi-consistency --  Pill --  Cervical Esophageal Comment --     Pablo Lawrence 01/12/2020, 5:53 PM

## 2020-01-12 NOTE — Progress Notes (Signed)
Physical Therapy Treatment Patient Details Name: [name] [name] MRN: [id number] DOB: [birth year] Today's Date: 01/12/2020    History of Present Illness The pt is an 86 yo female presenting with acute onset aphasia and R-sided weakness. Imaging revealed L basal ganglia/thalamic hemorrhage with 81mm midline shift and no intraventricular extension. The pt was intubated 11/7. PMH includes: DM II, HTN, CHF, and colon polyps. pt extubated 11/9.    PT Comments    Patient progressing slowly towards PT goals. This session focused on standing bouts int he stedy working on weight shifting with an emphasis on WB through RUE/LE as well as finding midline. Pt favors right lateral lean esp when fatigued but able to self correct with cues. Worked on static sitting balance, midline and upright posture requiring Min A with moments of Min guard. Pt highly motivated to improve function and participate in therapy. Noted to have some active movement of RLE to command with decreased initiation. Continue to recommend CIR. Has great family support. Will follow.   Follow Up Recommendations  CIR     Equipment Recommendations  Other (comment) (defer to next venue)    Recommendations for Other Services       Precautions / Restrictions Precautions Precautions: Fall;Other (comment) Precaution Comments: R-sided hemiparesis, NG tube Restrictions Weight Bearing Restrictions: No    Mobility  Bed Mobility Overal bed mobility: Needs Assistance Bed Mobility: Rolling;Sidelying to Sit;Sit to Sidelying Rolling: Max assist Sidelying to sit: Max assist;+2 for physical assistance     Sit to sidelying: Total assist;+2 for physical assistance General bed mobility comments: Cues to reach for rail with LUE and to push through left heel to scoot bottom, assist with RLE, bottom and elevating trunk to get to EOB. Assist lowering trunk and with BLEs to return to sidelying.  Transfers Overall transfer level: Needs  assistance Equipment used: Ambulation equipment used Transfers: Sit to/from Stand Sit to Stand: Mod assist;+2 safety/equipment;From elevated surface;Min assist         General transfer comment: Assist of 2 to stand from elevated bed height with therapist manually placing RUE on stedy bar; use of pad to elevate hips and cues for upright posture. Performed x1 from EOB, x3 from stedy flaps only requiring Min A.  Ambulation/Gait             General Gait Details: Deferred   Stairs             Wheelchair Mobility    Modified Rankin (Stroke Patients Only) Modified Rankin (Stroke Patients Only) Pre-Morbid Rankin Score: No symptoms Modified Rankin: Severe disability     Balance Overall balance assessment: Needs assistance Sitting-balance support: Feet supported;No upper extremity supported Sitting balance-Leahy Scale: Fair Sitting balance - Comments: Requires Min-Min guard assist for sitting balance with cues for upright posture, posterior lean at times. Fatigues towards end of session requiring total A. Postural control: Posterior lean Standing balance support: During functional activity Standing balance-Leahy Scale: Poor Standing balance comment: Worked on weight shifting in stedy with emphasis on WB through RUE/LE and finding midline/upright, Pt favors leaning right but able to correct with cues.                            Cognition Arousal/Alertness: Awake/alert Behavior During Therapy: Flat affect Overall Cognitive Status: Impaired/Different from baseline Area of Impairment: Orientation;Problem solving                 Orientation Level: Disoriented to;Time;Situation  Problem Solving: Decreased initiation;Difficulty sequencing;Requires verbal cues;Requires tactile cues General Comments: Stated it was September but knew it was Monday. follows simple commands with increase time and repetition. "let me try to do it."      Exercises       General Comments General comments (skin integrity, edema, etc.): Pt's son Rocky Link, present for session. Edema present right hand/RUE.      Pertinent Vitals/Pain Pain Assessment: No/denies pain Faces Pain Scale: No hurt    Home Living                      Prior Function            PT Goals (current goals can now be found in the care plan section) Progress towards PT goals: Progressing toward goals    Frequency    Min 4X/week      PT Plan Current plan remains appropriate    Co-evaluation PT/OT/SLP Co-Evaluation/Treatment: Yes Reason for Co-Treatment: Complexity of the patient's impairments (multi-system involvement);To address functional/ADL transfers PT goals addressed during session: Mobility/safety with mobility;Balance;Strengthening/ROM        AM-PAC PT "6 Clicks" Mobility   Outcome Measure  Help needed turning from your back to your side while in a flat bed without using bedrails?: Total Help needed moving from lying on your back to sitting on the side of a flat bed without using bedrails?: Total Help needed moving to and from a bed to a chair (including a wheelchair)?: Total Help needed standing up from a chair using your arms (e.g., wheelchair or bedside chair)?: A Lot Help needed to walk in hospital room?: Total Help needed climbing 3-5 steps with a railing? : Total 6 Click Score: 7    End of Session Equipment Utilized During Treatment: Other (comment) (NG tube) Activity Tolerance: Patient tolerated treatment well;Patient limited by fatigue Patient left: in bed;with call bell/phone within reach;with bed alarm set;with family/visitor present;with SCD's reapplied Nurse Communication: Mobility status;Need for lift equipment PT Visit Diagnosis: Other abnormalities of gait and mobility (R26.89);Muscle weakness (generalized) (M62.81);Hemiplegia and hemiparesis Hemiplegia - Right/Left: Right Hemiplegia - dominant/non-dominant: Dominant Hemiplegia -  caused by: Other cerebrovascular disease     Time: 1100-1129 PT Time Calculation (min) (ACUTE ONLY): 29 min  Charges:  $Neuromuscular Re-education: 8-22 mins                     Vale Haven, PT, DPT Acute Rehabilitation Services Pager [phone] Office [phone]       Blake Divine A Lanier Ensign 01/12/2020, 12:13 PM

## 2020-01-13 ENCOUNTER — Inpatient Hospital Stay (HOSPITAL_COMMUNITY): Payer: Medicare Other

## 2020-01-13 DIAGNOSIS — R569 Unspecified convulsions: Secondary | ICD-10-CM | POA: Diagnosis not present

## 2020-01-13 DIAGNOSIS — I10 Essential (primary) hypertension: Secondary | ICD-10-CM | POA: Diagnosis not present

## 2020-01-13 DIAGNOSIS — I610 Nontraumatic intracerebral hemorrhage in hemisphere, subcortical: Secondary | ICD-10-CM | POA: Diagnosis not present

## 2020-01-13 DIAGNOSIS — I611 Nontraumatic intracerebral hemorrhage in hemisphere, cortical: Secondary | ICD-10-CM | POA: Diagnosis not present

## 2020-01-13 DIAGNOSIS — N1831 Chronic kidney disease, stage 3a: Secondary | ICD-10-CM | POA: Diagnosis not present

## 2020-01-13 LAB — BLOOD GAS, ARTERIAL
Acid-Base Excess: 7.1 mmol/L — ABNORMAL HIGH (ref 0.0–2.0)
Bicarbonate: 31 mmol/L — ABNORMAL HIGH (ref 20.0–28.0)
Drawn by: 60057
FIO2: 21
O2 Saturation: 93.3 %
Patient temperature: 37
pCO2 arterial: 42.8 mmHg (ref 32.0–48.0)
pH, Arterial: 7.473 — ABNORMAL HIGH (ref 7.350–7.450)
pO2, Arterial: 68.5 mmHg — ABNORMAL LOW (ref 83.0–108.0)

## 2020-01-13 LAB — CBC WITH DIFFERENTIAL/PLATELET
Abs Immature Granulocytes: 0.36 10*3/uL — ABNORMAL HIGH (ref 0.00–0.07)
Basophils Absolute: 0.1 10*3/uL (ref 0.0–0.1)
Basophils Relative: 1 %
Eosinophils Absolute: 0.2 10*3/uL (ref 0.0–0.5)
Eosinophils Relative: 2 %
HCT: 44.5 % (ref 36.0–46.0)
Hemoglobin: 14.2 g/dL (ref 12.0–15.0)
Immature Granulocytes: 4 %
Lymphocytes Relative: 16 %
Lymphs Abs: 1.5 10*3/uL (ref 0.7–4.0)
MCH: 30.1 pg (ref 26.0–34.0)
MCHC: 31.9 g/dL (ref 30.0–36.0)
MCV: 94.5 fL (ref 80.0–100.0)
Monocytes Absolute: 1 10*3/uL (ref 0.1–1.0)
Monocytes Relative: 10 %
Neutro Abs: 6.5 10*3/uL (ref 1.7–7.7)
Neutrophils Relative %: 67 %
Platelets: 225 10*3/uL (ref 150–400)
RBC: 4.71 MIL/uL (ref 3.87–5.11)
RDW: 14.3 % (ref 11.5–15.5)
WBC: 9.7 10*3/uL (ref 4.0–10.5)
nRBC: 0.2 % (ref 0.0–0.2)

## 2020-01-13 LAB — COMPREHENSIVE METABOLIC PANEL
ALT: 36 U/L (ref 0–44)
AST: 37 U/L (ref 15–41)
Albumin: 2.5 g/dL — ABNORMAL LOW (ref 3.5–5.0)
Alkaline Phosphatase: 52 U/L (ref 38–126)
Anion gap: 12 (ref 5–15)
BUN: 38 mg/dL — ABNORMAL HIGH (ref 8–23)
CO2: 30 mmol/L (ref 22–32)
Calcium: 8.5 mg/dL — ABNORMAL LOW (ref 8.9–10.3)
Chloride: 100 mmol/L (ref 98–111)
Creatinine, Ser: 1.27 mg/dL — ABNORMAL HIGH (ref 0.44–1.00)
GFR, Estimated: 41 mL/min — ABNORMAL LOW (ref >60)
Glucose, Bld: 178 mg/dL — ABNORMAL HIGH (ref 70–99)
Potassium: 3.8 mmol/L (ref 3.5–5.1)
Sodium: 142 mmol/L (ref 135–145)
Total Bilirubin: 0.5 mg/dL (ref 0.3–1.2)
Total Protein: 6.2 g/dL — ABNORMAL LOW (ref 6.5–8.1)

## 2020-01-13 LAB — GLUCOSE, CAPILLARY
Glucose-Capillary: 107 mg/dL — ABNORMAL HIGH (ref 70–99)
Glucose-Capillary: 125 mg/dL — ABNORMAL HIGH (ref 70–99)
Glucose-Capillary: 151 mg/dL — ABNORMAL HIGH (ref 70–99)
Glucose-Capillary: 161 mg/dL — ABNORMAL HIGH (ref 70–99)
Glucose-Capillary: 166 mg/dL — ABNORMAL HIGH (ref 70–99)
Glucose-Capillary: 180 mg/dL — ABNORMAL HIGH (ref 70–99)
Glucose-Capillary: 190 mg/dL — ABNORMAL HIGH (ref 70–99)
Glucose-Capillary: 193 mg/dL — ABNORMAL HIGH (ref 70–99)

## 2020-01-13 LAB — URINALYSIS, ROUTINE W REFLEX MICROSCOPIC
Bacteria, UA: NONE SEEN
Bilirubin Urine: NEGATIVE
Glucose, UA: NEGATIVE mg/dL
Hgb urine dipstick: NEGATIVE
Ketones, ur: NEGATIVE mg/dL
Leukocytes,Ua: NEGATIVE
Nitrite: NEGATIVE
Protein, ur: 100 mg/dL — AB
Specific Gravity, Urine: 1.015 (ref 1.005–1.030)
pH: 8 (ref 5.0–8.0)

## 2020-01-13 LAB — AMMONIA: Ammonia: 38 umol/L — ABNORMAL HIGH (ref 9–35)

## 2020-01-13 LAB — PHOSPHORUS: Phosphorus: 3.7 mg/dL (ref 2.5–4.6)

## 2020-01-13 LAB — MAGNESIUM: Magnesium: 2.2 mg/dL (ref 1.7–2.4)

## 2020-01-13 MED ORDER — HYDRALAZINE HCL 50 MG PO TABS
100.0000 mg | ORAL_TABLET | Freq: Three times a day (TID) | ORAL | Status: DC
  Administered 2020-01-13 – 2020-01-15 (×4): 100 mg
  Filled 2020-01-13 (×7): qty 2

## 2020-01-13 NOTE — Progress Notes (Signed)
  Speech Language Pathology Treatment: Dysphagia  Patient Details Name: SANDA DEJOY MRN: [id number] DOB: 1932-07-07 Today's Date: 01/13/2020 Time: 6606-3016 SLP Time Calculation (min) (ACUTE ONLY): 12 min  Assessment / Plan / Recommendation Clinical Impression  Patient seen for f/u dysphagia treatment/diet tolerance assessment. Noted events of this am however per RN, patient was alert this morning, talking, eating breakfast without any evidence of difficulty. Patient lethargic this am without ability to fully arouse despite sternal rub. RN informed. Provided extensive education to patient's daughter, including written information, regarding patient's MBS results, recommendations, prognosis, and plan for the future as it pertains to patients swallowing abilities. Will plan to f/u next date for ability to consume pos. If alertness improves, may given patient ordered po diet, monitoring for s/s of aspiration. If present, hold pos and contact SLP.    HPI HPI: The pt is an 86 yo female presenting with acute onset aphasia and R-sided weakness. Imaging revealed L basal ganglia/thalamic hemorrhage with 55mm midline shift and no intraventricular extension. The pt was intubated 11/7-11/9. PMH includes: DM II, HTN, CHF, and colon polyps.       SLP Plan  Continue with current plan of care       Recommendations  Diet recommendations: Dysphagia 1 (puree);Thin liquid Liquids provided via: Cup;Straw Medication Administration: Crushed with puree Supervision: Full supervision/cueing for compensatory strategies Compensations: Minimize environmental distractions;Slow rate;Small sips/bites;Monitor for anterior loss;Lingual sweep for clearance of pocketing Postural Changes and/or Swallow Maneuvers: Seated upright 90 degrees                Oral Care Recommendations: Oral care QID Follow up Recommendations: Inpatient Rehab SLP Visit Diagnosis: Dysphagia, oropharyngeal phase (R13.12) Plan: Continue  with current plan of care       GO             Cayson Kalb MA, CCC-SLP     Arn Mcomber Meryl 01/13/2020, 12:09 PM

## 2020-01-13 NOTE — Progress Notes (Signed)
Floor coverage  Notified by RN that patient was AAO x4 and verbal earlier during the shift but when they assessed her this morning they noticed that patient was not able to speak and was not following commands.  Blood pressure elevated with systolic in 190s, remainder of vital signs stable.  CBG 166.  Patient seen and examined at bedside.  Awake but not alert.  Has a blank stare, nonverbal, and not following any commands.  Moving her left upper extremity spontaneously.  No movement of the right upper extremity and lower extremities noted.  Lungs clear on auscultation.  Notified by RN that patient did have right-sided abdominal tenderness on exam earlier.  At the time of my evaluation, her left upper quadrant was tender.  RN confirmed that patient is having regular bowel movements and no vomiting.  -Patient has not received any sedating medications. -Stat head CT done and showing subacute left thalamic hematoma and chronic small vessel ischemia; no acute or interval finding. -Stat ABG ordered -Stat chest x-ray and KUB -LFTs normal and no significant electrolyte derangements on CMP.  No change in renal function. -Check CBC and ammonia level -UA -Neurology called and also assessed the patient.  Recommending routine EEG in the morning and no antiepileptics for now.  Also recommending repeating brain MRI to evaluate for any evidence of ischemic stroke.  Studies have been ordered.  -Blood pressure management, blood pressure goal should be closer to normotension now, out of permissive hypertension window.

## 2020-01-13 NOTE — Progress Notes (Addendum)
Neurology Progress Note   S:// Neurology service recalled secondary to a rapid response event this morning. Patient was last seen normal around midnight and then went to sleep. Upon trying to wake her up for further assessment this morning, patient was not following any commands and was extremely somnolent. Primary team attending examined the patient and ordered a noncontrast head CT stat since the patient's admitting diagnosis was a left basal ganglia ICH to ensure that the bleed was not expanding.    O:// Current vital signs: BP (!) 187/59 (BP Location: Left Arm)   Pulse 77   Temp 98.1 F (36.7 C) (Oral)   Resp 19   Ht 5\' 3"  (1.6 m)   Wt 104 kg   SpO2 98%   BMI 40.61 kg/m  Vital signs in last 24 hours: Temp:  [97.8 F (36.6 C)-98.3 F (36.8 C)] 98.1 F (36.7 C) (11/16 0337) Pulse Rate:  [67-77] 77 (11/16 0337) Resp:  [18-20] 19 (11/16 0337) BP: (124-187)/(45-94) 187/59 (11/16 0337) SpO2:  [94 %-98 %] 98 % (11/16 0337) General: She appeared awake and alert but was not following commands. HEENT: Normocephalic atraumatic Lungs: Scattered rales CVS: Regular rate rhythm Neurological exam Awake alert not following commands Nonverbal Actively closes eyes when trying to examine the eyes and examining pupils. Blinks to threat from both sides Cranial nerves: Pupils equal round react light, extraocular movements appear unhindered, blinks to threat from both sides, face appears mildly asymmetric with right nasolabial fold flattening. Motor exam: Flaccid right upper extremity, right lower extremity with some movement 2/5.  Left upper and lower extremity are at least 4-4+/5.  Actively resists with left upper and lower extremity. Sensory exam: Grimaces to noxious stimulation on the right without much withdrawal on the upper extremity, some attempted withdrawal on the right lower extremity.  Actively withdrawing to noxious stimulation on the left. Unable to check coordination due to her  mentation  Medications  Current Facility-Administered Medications:  .  [DISCONTINUED] acetaminophen (TYLENOL) tablet 650 mg, 650 mg, Oral, Q4H PRN **OR** acetaminophen (TYLENOL) 160 MG/5ML solution 650 mg, 650 mg, Per Tube, Q4H PRN, 650 mg at 01/09/20 2131 **OR** acetaminophen (TYLENOL) suppository 650 mg, 650 mg, Rectal, Q4H PRN, Biby, Sharon L, NP .  albuterol (PROVENTIL) (2.5 MG/3ML) 0.083% nebulizer solution 2.5 mg, 2.5 mg, Nebulization, Q6H PRN, Biby, Sharon L, NP .  chlorhexidine (PERIDEX) 0.12 % solution 15 mL, 15 mL, Mouth Rinse, BID, Biby, Sharon L, NP, 15 mL at 01/12/20 2025 .  Chlorhexidine Gluconate Cloth 2 % PADS 6 each, 6 each, Topical, Daily, Bhagat, Srishti L, MD, 6 each at 01/11/20 1027 .  dextrose 50 % solution 0-50 mL, 0-50 mL, Intravenous, PRN, Biby, Sharon L, NP .  docusate (COLACE) 50 MG/5ML liquid 100 mg, 100 mg, Per Tube, BID, Biby, Sharon L, NP, 100 mg at 01/12/20 0944 .  feeding supplement (ENSURE ENLIVE / ENSURE PLUS) liquid 237 mL, 237 mL, Oral, BID BM, 01/14/20, MD .  feeding supplement (GLUCERNA 1.2 CAL) liquid 960 mL, 960 mL, Per Tube, Q24H, Drema Dallas, MD, Last Rate: 80 mL/hr at 01/12/20 1954, 960 mL at 01/12/20 1954 .  feeding supplement (PROSource TF) liquid 90 mL, 90 mL, Per Tube, BID, 01/14/20, MD .  free water 200 mL, 200 mL, Per Tube, Q6H, Drema Dallas, MD, 200 mL at 01/13/20 0025 .  furosemide (LASIX) tablet 20 mg, 20 mg, Per Tube, Daily, Biby, Sharon L, NP, 20 mg at 01/12/20 0948 .  heparin injection 5,000 Units, 5,000 Units, Subcutaneous, Q8H, Marvel Plan, MD, 5,000 Units at 01/12/20 2026 .  hydrALAZINE (APRESOLINE) tablet 75 mg, 75 mg, Per Tube, Q6H, Drema Dallas, MD, 75 mg at 01/13/20 0018 .  hydrochlorothiazide (HYDRODIURIL) tablet 25 mg, 25 mg, Per Tube, Daily, Biby, Sharon L, NP, 25 mg at 01/12/20 0948 .  insulin aspart (novoLOG) injection 0-20 Units, 0-20 Units, Subcutaneous, Q4H, Biby, Sharon L, NP, 4 Units at 01/13/20 0444 .   insulin aspart (novoLOG) injection 8 Units, 8 Units, Subcutaneous, Q4H, Biby, Sharon L, NP, 8 Units at 01/13/20 0444 .  insulin glargine (LANTUS) injection 50 Units, 50 Units, Subcutaneous, BID, Layne Benton, NP, 50 Units at 01/12/20 2027 .  labetalol (NORMODYNE) injection 10-20 mg, 10-20 mg, Intravenous, Q2H PRN, Drema Dallas, MD .  losartan (COZAAR) tablet 50 mg, 50 mg, Per Tube, BID, Micki Riley, MD, 50 mg at 01/12/20 2028 .  MEDLINE mouth rinse, 15 mL, Mouth Rinse, q12n4p, Biby, Sharon L, NP, 15 mL at 01/12/20 1600 .  metoprolol tartrate (LOPRESSOR) tablet 100 mg, 100 mg, Per Tube, BID, Annie Main L, NP, 100 mg at 01/12/20 2028 .  pantoprazole sodium (PROTONIX) 40 mg/20 mL oral suspension 40 mg, 40 mg, Per Tube, Daily, Biby, Sharon L, NP, 40 mg at 01/12/20 0949 .  polyethylene glycol (MIRALAX / GLYCOLAX) packet 17 g, 17 g, Per Tube, Daily, Biby, Sharon L, NP, 17 g at 01/12/20 0949 .  senna-docusate (Senokot-S) tablet 1 tablet, 1 tablet, Per Tube, BID, Layne Benton, NP, 1 tablet at 01/12/20 0948 Labs CBC    Component Value Date/Time   WBC 11.5 (H) 01/12/2020 0448   RBC 4.53 01/12/2020 0448   HGB 13.5 01/12/2020 0448   HCT 41.9 01/12/2020 0448   PLT 251 01/12/2020 0448   MCV 92.5 01/12/2020 0448   MCH 29.8 01/12/2020 0448   MCHC 32.2 01/12/2020 0448   RDW 14.4 01/12/2020 0448   LYMPHSABS 1.8 01/12/2020 0448   MONOABS 1.3 (H) 01/12/2020 0448   EOSABS 0.3 01/12/2020 0448   BASOSABS 0.1 01/12/2020 0448    CMP     Component Value Date/Time   NA 142 01/13/2020 0420   K 3.8 01/13/2020 0420   CL 100 01/13/2020 0420   CO2 30 01/13/2020 0420   GLUCOSE 178 (H) 01/13/2020 0420   BUN 38 (H) 01/13/2020 0420   CREATININE 1.27 (H) 01/13/2020 0420   CALCIUM 8.5 (L) 01/13/2020 0420   PROT 6.2 (L) 01/13/2020 0420   ALBUMIN 2.5 (L) 01/13/2020 0420   AST 37 01/13/2020 0420   ALT 36 01/13/2020 0420   ALKPHOS 52 01/13/2020 0420   BILITOT 0.5 01/13/2020 0420   GFRNONAA 41 (L)  01/13/2020 0420   GFRAA 39 (L) 06/11/2019 1436    glycosylated hemoglobin  Lipid Panel     Component Value Date/Time   CHOL 201 (H) 01/04/2020 1601   TRIG 398 (H) 01/06/2020 0512   HDL 37 (L) 01/04/2020 1601   CHOLHDL 5.4 01/04/2020 1601   VLDL UNABLE TO CALCULATE IF TRIGLYCERIDE OVER 400 mg/dL 87/56/4332 9518   LDLCALC UNABLE TO CALCULATE IF TRIGLYCERIDE OVER 400 mg/dL 84/16/6063 0160   LDLDIRECT 121.8 (H) 01/04/2020 1601     Imaging I have reviewed images in epic and the results pertinent to this consultation are: Noncontrasted head CT with unchanged, may be somewhat diminished size of the left basal ganglia bleed. MRI done during this admission could not identify any underlying vascular malformation. MRA head with  intracranial atherosclerosis   Assessment: 86 year old woman with past medical history of hypertension, CHF, presented with aphasia and right-sided weakness which progressed to lethargy and emesis with asymmetric pupils requiring intubation.  Finally was able to be extubated and transferred to the neuro progressive floor where she had a sudden change in her examination from the prior day with more lethargy, difficult to arouse and symptoms consistent with aphasia versus inattention. Compared to the prior neurological exam on file from 2 days ago, this is a change-especially in her speech and level of consciousness. Given her history of respiratory failure, differentials include possible CO2 retention/CO2 narcosis (toxic metabolic encephalopathy) Blood pressure was on the higher side-hypertensive urgency is on differentials as well. Given the recent stroke, seizures should also be in the differentials. CT head with no extension of the bleed.  Recommendations: Chest x-ray ABG Ammonia level Routine EEG in the morning No antiepileptics for now This morning's BMP with hyperglycemia, nearly unchanged BUN and creatinine from prior days. CBC pending Check  urinalysis Consider repeat MR brain to evaluate for any evidence of an ischemic stroke. Not a candidate for TPA due to ICH. Blood pressure management per primary team-more than a week out from an ICH, the blood pressure goal should be closer to normotension-defer management to primary team. Stroke team will continue to follow with you once more results become available.  Discussed with the rapid response team.   -- Milon Dikes, MD [facility] Pager: [phone] If 7pm to 7am, please call on call as listed on AMION.   CRITICAL CARE ATTESTATION Performed by: Milon Dikes, MD Total critical care time: 35 minutes Critical care time was exclusive of separately billable procedures and treating other patients and/or supervising APPs/Residents/Students Critical care was necessary to treat or prevent imminent or life-threatening deterioration due to intracerebral hemorrhage, evaluation for possible toxic metabolic encephalopathy This patient is critically ill and at significant risk for neurological worsening and/or death and care requires constant monitoring. Critical care was time spent personally by me on the following activities: development of treatment plan with patient and/or surrogate as well as nursing, discussions with consultants, evaluation of patient's response to treatment, examination of patient, obtaining history from patient or surrogate, ordering and performing treatments and interventions, ordering and review of laboratory studies, ordering and review of radiographic studies, pulse oximetry, re-evaluation of patient's condition, participation in multidisciplinary rounds and medical decision making of high complexity in the care of this patient.

## 2020-01-13 NOTE — Significant Event (Addendum)
Rapid Response Event Note   Reason for Call :  Difficult to arouse.  Initial Focused Assessment:  Pt laying in bed with eyes closed. Pt will open eyes and move LUE, LLE, RLE to painful stimuli. Pt does have a R facial droop. All of this is baseline for pt. Pt will not speak or follow commands but does moan to pain. Per RN, pt was alert and oriented, speaking appropriately at 0000.  Pt goes back to sleep very easily when not stimulated. Pupils 3, equal, and brisk. Skin warm and dry. HR-81, SBP-190s, RR-16, SpO2-98% on RA.     Interventions:  CBG-193 CT head-1. Subacute left thalamic hematoma.  No acute or interval finding. 2. Chronic small vessel ischemia. ABG-7.47/42.8/68.5/31 PCXR KUB Ammonia U/A EEG  Plan of Care:  Pt more arousable and purposeful than when first assessed. She is protecting her airway. Await additional test results and relay to MD. Continue to monitor pt. Call RRT if further assistance needed.   Event Summary:   MD Notified: Dr. Loney Loh notified and came to bedside. Dr. Wilford Corner notified and came to bedside.  Call Time:0529 Arrival OITG:5498 End YMEB:5830  Terrilyn Saver, RN

## 2020-01-13 NOTE — Progress Notes (Signed)
Physical Therapy Treatment Patient Details Name: [name] [name] MRN: [id number] DOB: Dec 20, 1932 Today's Date: 01/13/2020    History of Present Illness The pt is an 86 yo female presenting with acute onset aphasia and R-sided weakness. Imaging revealed L basal ganglia/thalamic hemorrhage with 76mm midline shift and no intraventricular extension. The pt was intubated 11/7. PMH includes: DM II, HTN, CHF, and colon polyps. pt extubated 11/9.    PT Comments    Pt awake and alert on eval. Worked on supine rolling in bed, required max A but able to hold self on R side with use of L hand on rail. Max A for LE's off bed to sit up but pt able to initiate trunk elevation with push of LUE. Pt performed sit>stand within stedy and worked on pregait standing in stedy. Pt unable to wt shift adequately to step feet yet. Pt pivoted to chair in stedy and returned to sitting with max A +2 for control. Pt encouraged to be out of bed. PT will continue to follow.    Follow Up Recommendations  CIR     Equipment Recommendations  Other (comment) (defer to next venue)    Recommendations for Other Services Rehab consult     Precautions / Restrictions Precautions Precautions: Fall;Other (comment) Precaution Comments: R-sided hemiparesis, NG tube Restrictions Weight Bearing Restrictions: No    Mobility  Bed Mobility Overal bed mobility: Needs Assistance Bed Mobility: Rolling;Sidelying to Sit Rolling: Max assist Sidelying to sit: Max assist       General bed mobility comments: able to grasp rail with L hand and hold pt in R SL after max A to roll to L. Max A to roll to R. Max A for LE's off bed but pt able to initiate push away from bed with LUE to elevate trunk into sitting. Max A given at shoulders  Transfers Overall transfer level: Needs assistance Equipment used: Ambulation equipment used Transfers: Sit to/from [employer] Sit to Stand: Mod assist;+2 safety/equipment;From elevated  surface Stand pivot transfers: Total assist;+2 safety/equipment       General transfer comment: mod A to R side for support, pt able to push through LLE and pull on stedy bar with LUE. anterior and superior force given at chest and back for upward motion. +2 for safety  Ambulation/Gait             General Gait Details: unable to step feet   Stairs             Wheelchair Mobility    Modified Rankin (Stroke Patients Only) Modified Rankin (Stroke Patients Only) Pre-Morbid Rankin Score: No symptoms Modified Rankin: Severe disability     Balance Overall balance assessment: Needs assistance Sitting-balance support: Feet supported;No upper extremity supported Sitting balance-Leahy Scale: Fair Sitting balance - Comments: close guarding in sitting due to occasional fwd lean and R lean with decreased ability to correct. Worked on correcting with vc's before tactile cues   Standing balance support: During functional activity Standing balance-Leahy Scale: Zero Standing balance comment: heavy R lean in standing. Worked on finding midline                            Cognition Arousal/Alertness: Awake/alert Behavior During Therapy: WFL for tasks assessed/performed Overall Cognitive Status: Impaired/Different from baseline Area of Impairment: Orientation;Problem solving                 Orientation Level: Disoriented to;Time;Situation  Following Commands: Follows one step commands with increased time (with vision)   Awareness: Intellectual Problem Solving: Decreased initiation;Difficulty sequencing;Requires verbal cues;Requires tactile cues General Comments: pt asking about when dinner is coming and where her daughter is, both appropriate questions at the time      Exercises Total Joint Exercises Ankle Circles/Pumps: PROM;Right;AROM;Left;10 reps;Seated General Exercises - Lower Extremity Long Arc Quad: AAROM;Right;5 reps;Seated Other  Exercises Other Exercises: PROM R shoulder flexion    General Comments General comments (skin integrity, edema, etc.): SpO2 91%      Pertinent Vitals/Pain Pain Assessment: Faces Faces Pain Scale: Hurts a little bit Pain Location: generalized grimacing with perineal care Pain Descriptors / Indicators: Discomfort;Grimacing Pain Intervention(s): Limited activity within patient's tolerance;Monitored during session    Home Living                      Prior Function            PT Goals (current goals can now be found in the care plan section) Acute Rehab PT Goals Patient Stated Goal: to get out of here PT Goal Formulation: With patient Time For Goal Achievement: 01/19/20 Potential to Achieve Goals: Fair Progress towards PT goals: Progressing toward goals    Frequency    Min 4X/week      PT Plan Current plan remains appropriate    Co-evaluation              AM-PAC PT "6 Clicks" Mobility   Outcome Measure  Help needed turning from your back to your side while in a flat bed without using bedrails?: Total Help needed moving from lying on your back to sitting on the side of a flat bed without using bedrails?: Total Help needed moving to and from a bed to a chair (including a wheelchair)?: Total Help needed standing up from a chair using your arms (e.g., wheelchair or bedside chair)?: A Lot Help needed to walk in hospital room?: Total Help needed climbing 3-5 steps with a railing? : Total 6 Click Score: 7    End of Session Equipment Utilized During Treatment: Other (comment);Oxygen (NG tube) Activity Tolerance: Patient tolerated treatment well Patient left: with call bell/phone within reach;with family/visitor present;in chair;with chair alarm set Nurse Communication: Mobility status;Need for lift equipment PT Visit Diagnosis: Other abnormalities of gait and mobility (R26.89);Muscle weakness (generalized) (M62.81);Hemiplegia and hemiparesis Hemiplegia -  Right/Left: Right Hemiplegia - dominant/non-dominant: Dominant Hemiplegia - caused by: Other cerebrovascular disease     Time: 0962-8366 PT Time Calculation (min) (ACUTE ONLY): 32 min  Charges:  $Gait Training: 8-22 mins $Therapeutic Activity: 8-22 mins                     Lyanne Co, PT  Acute Rehab Services  Pager [phone] Office [phone]    [name] [name] [name] [name] 01/13/2020, 5:17 PM

## 2020-01-13 NOTE — Progress Notes (Signed)
EEG complete - results pending 

## 2020-01-13 NOTE — Progress Notes (Signed)
Inpatient Rehab Admissions Coordinator:   Note change in status this AM.  Workup pending.  Will continue to follow.   Estill Dooms, PT, DPT Admissions Coordinator [phone] 01/13/20  10:56 AM

## 2020-01-13 NOTE — Progress Notes (Signed)
STROKE TEAM PROGRESS NOTE   INTERVAL HISTORY Her daughter is at the bedside. She is lying in bed, awake alert, interactive currently. Earlier today, around 6pm, pt woke up not speaking, not following commands and sleepy. Rapid response called and head CT repeat no acute finding. ABG and CXR unremarkable. Pt gradually getting better, this morning on my rounding, pt at her baseline. Still has right hemiplegia. Pending CIR.   Vitals:   01/13/20 0337 01/13/20 0733 01/13/20 1147 01/13/20 1536  BP: (!) 187/59 (!) 159/53 (!) 136/50 108/74  Pulse: 77 75 62 65  Resp: 19 16 18 16   Temp: 98.1 F (36.7 C) 98.5 F (36.9 C) 98.7 F (37.1 C) 98.5 F (36.9 C)  TempSrc: Oral Oral Oral Oral  SpO2: 98% 95% 93% 96%  Weight:      Height:       CBC:  Recent Labs  Lab 01/12/20 0448 01/13/20 0420  WBC 11.5* 9.7  NEUTROABS 7.9* 6.5  HGB 13.5 14.2  HCT 41.9 44.5  MCV 92.5 94.5  PLT 251 225   Basic Metabolic Panel:  Recent Labs  Lab 01/12/20 0448 01/13/20 0420  NA 141 142  K 3.9 3.8  CL 101 100  CO2 33* 30  GLUCOSE 125* 178*  BUN 45* 38*  CREATININE 1.28* 1.27*  CALCIUM 8.3* 8.5*  MG 2.4 2.2  PHOS 4.0 3.7   Lipid Panel:  No results for input(s): CHOL, TRIG, HDL, CHOLHDL, VLDL, LDLCALC in the last 168 hours. HgbA1c:  No results for input(s): HGBA1C in the last 168 hours. Urine Drug Screen: No results for input(s): LABOPIA, COCAINSCRNUR, LABBENZ, AMPHETMU, THCU, LABBARB in the last 168 hours.  Alcohol Level No results for input(s): ETH in the last 168 hours.  IMAGING past 24 hours DG Abd 1 View  Result Date: 01/13/2020 CLINICAL DATA:  Abdominal tenderness. EXAM: ABDOMEN - 1 VIEW COMPARISON:  CT 06/11/2019. FINDINGS: Surgical clips right upper quadrant. Feeding tube noted with tip over the distal stomach. No bowel distention. Barium noted throughout the colon. No free air. Degenerative change lumbar spine. IMPRESSION: Feeding tube noted with tip over the distal stomach. No bowel  distention. Barium noted throughout the colon. Electronically Signed   By: 06/13/2019  Register   On: 01/13/2020 06:37   CT HEAD WO CONTRAST  Result Date: 01/13/2020 CLINICAL DATA:  Mental status change with unknown cause EXAM: CT HEAD WITHOUT CONTRAST TECHNIQUE: Contiguous axial images were obtained from the base of the skull through the vertex without intravenous contrast. COMPARISON:  01/04/2020 FINDINGS: Brain: Left thalamic hematoma which measures 25 x 14 mm on coronal reformats, essentially size stable and with stable shape. Thin rim of adjacent edema, expected. The hematoma has more indistinct margination, expected. No intraventricular extension or hydrocephalus. Confluent chronic small vessel ischemia in the cerebral white matter. No evidence of gray matter infarction. Vascular: Atheromatous calcification. 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 Sinuses/Orbits: Negative for acute finding. Bilateral cataract resection IMPRESSION: 1. Subacute left thalamic hematoma.  No acute or interval finding. 2. Chronic small vessel ischemia. Electronically Signed   By: 13/08/2019 M.D.   On: 01/13/2020 06:18   DG CHEST PORT 1 VIEW  Result Date: 01/13/2020 CLINICAL DATA:  Altered mental status. EXAM: PORTABLE CHEST 1 VIEW COMPARISON:  Chest x-ray 01/08/2020. FINDINGS: Feeding tube noted with tip below left hemidiaphragm. Heart size normal. Low lung volumes with basilar atelectasis. Interim near complete clearing of left base infiltrate. No pleural effusion or pneumothorax. Barium noted the colon. IMPRESSION:  1. Feeding tube noted with tip below left hemidiaphragm. 2. Low lung volumes with basilar atelectasis. Interim near complete clearing of left base infiltrate. Electronically Signed   By: Maisie Fus  Register   On: 01/13/2020 07:30   EEG adult  Result Date: 01/13/2020 Charlsie Quest, MD     01/13/2020 10:56 AM Patient Name: GAYLEEN SHOLTZ MRN: [id number] Epilepsy Attending: Charlsie Quest Referring  Physician/Provider: Dr. Milon Dikes Date: 01/13/2020 Duration: 24.25 minutes Patient history: 86 year old female presented with aphasia and right-sided weakness.  EEG to evaluate for seizures. Level of alertness: Awake AEDs during EEG study: None Technical aspects: This EEG study was done with scalp electrodes positioned according to the 10-20 International system of electrode placement. Electrical activity was acquired at a sampling rate of 500Hz  and reviewed with a high frequency filter of 70Hz  and a low frequency filter of 1Hz . EEG data were recorded continuously and digitally stored. Description: The posterior dominant rhythm consists of 8 Hz activity of moderate voltage (25-35 uV) seen predominantly in posterior head regions, symmetric and reactive to eye opening and eye closing.  Hyperventilation and photic stimulation were not performed.   IMPRESSION: This study is within normal limits. No seizures or epileptiform discharges were seen throughout the recording. Priyanka    PHYSICAL EXAM       Temp:  [97.8 F (36.6 C)-98.7 F (37.1 C)] 98.5 F (36.9 C) (11/16 1536) Pulse Rate:  [62-77] 65 (11/16 1536) Resp:  [16-20] 16 (11/16 1536) BP: (108-187)/(50-94) 108/74 (11/16 1536) SpO2:  [93 %-98 %] 96 % (11/16 1536)  General - Well nourished, well developed, in no apparent distress.  Ophthalmologic - fundi not visualized due to noncooperation.  Cardiovascular - Regular rhythm and rate.  Neuro - awake, alert, orientated to age and people.  Not oriented to time.  Mild to moderate dysarthria, no aphasia, but paucity of speech, naming 3/3, able to repeat.  Able to follow simple commands.  Visual field fall, PERRL, no gaze deviation.  Right facial droop, tongue midline.  Right upper extremity flaccid 0/5, right lower extremity 2/5 with some movement of knee flexion.  Left upper extremity 4/5, left lower extremity at least 3/5.  Sensation symmetrical, left finger-to-nose slow but grossly intact.   Gait not tested.   ASSESSMENT/PLAN Ms. Seidy C Shankle is a 86 y.o. female with history of diabetes, hypertension, congestive heart failure, colon polyps presenting with aphasia and R sided weakness which progressed to lethargy and emesis w/ assymetric pupils.   Acute encephalopathy   11/15 woke up not speaking, or following commands with lethargy  CT repeat no acute finding  ABG and CXR unremarkable  Gradually back to baseline.   Does not feel MRI needed - cancelled  Avoid sleep-wake cycle disturbance  ICH:   L BG ICH likely secondary to hypertension  CT head L thalamic and basal ganglia IPH w/ local mass effect and partial effacement 3rd ventricle w/ 62mm midline shift. Old pontine lacune. Anterior R frontal lobe meningioma vs calcification.   CT head after intubation unchanged  MRI  No underlying basal ganglia lesion. Extensive small vessel disease.   MRA  Intracranial atherosclerosis.   Carotid Doppler bilateral 1-39% carotid stenosis.  2D Echo EF 55 to 60%.  TG > 400 mg/dL, direct LDL 83   12/15 8.6  VTE prophylaxis - Heparin subq  aspirin 81 mg daily prior to admission, now on No antithrombotic given hemorrhage. Can restart ASA after bleed resolution, will need outpatient follow up  for repeat imaging.  Therapy recommendations:  CIR  Disposition:  pending   Acute hypoxic and hypercapnic respiratory failure due to hemorrhagic stroke  Secondary to stroke  Intubated in the ED d/t lethargy  Extubated 10/9  Code status DNR  CCM available prn   Tracheobronchitis vs PNA Leukocytosis   WBC 15.3,->13.3->11.5->11.2->9.1->9.7->11.2->9.7  TMax 100.5->afebrile   blood Cx NGTD  resp Cx NGTD  Rocephin 11/11>>11/15  Hypertension  Home meds:  catapress 0.2 am + 0.3 pm; lasix 20 (w/ K), metoprolol 200 XL  BP as high as 171/157 on admission  . Off Cleviprex . Increase SBP goal < 160 . BP on the high side . On metoprolol, HCTZ, lasix,  lorsatan . on hydralazine 50 Q8->50 Q6->100mg  Q8h . Long-term BP goal normotensive  Hyperlipidemia  Home meds:  pravachol 20  Hold statin in setting of acute hemorrhage   TG > 400  mg/dL, Direct LDL 993.5   Resume statin at discharge  Diabetes type II Uncontrolled  Home meds:  70/30 80u am + lunch, 70u hs  HgbA1c 8.6, goal < 7.0  CBGs  On lantus 50, TF coverage 6->8u q4h   SSI  Close PCP follow-up  Dysphagia . Secondary to stroke . Was on TF and FW . Now passed swallow on dys1 and thin liquid . Speech on board   Other Stroke Risk Factors  Advanced Age >/= 2   Morbid Obesity, Body mass index is 40.61 kg/m., BMI >/= 30 associated with increased stroke risk, recommend weight loss, diet and exercise as appropriate   Family hx stroke (brother)  Other Active Problems  CKD stage 3b Cre 1.2->1.37->1.22->1.17->1.27  CODE STATUS DNR  Hospital day # 9  Neurology will sign off. Please call with questions. Pt will follow up with stroke clinic NP at [facility] in about 4 weeks. Thanks for the consult.  Marvel Plan, MD PhD Stroke Neurology 01/13/2020 4:46 PM      To contact Stroke Continuity provider, please refer to [email or web address]. After hours, contact General Neurology

## 2020-01-13 NOTE — Progress Notes (Signed)
PROGRESS NOTE    [name] [name]  UMP:[id number] DOB: 1932-05-27 DOA: 01/04/2020 PCP: [name], MD     Brief Narrative:  86 yo WF PMHx DM type II uncontrolled with complication, DM retinopathy both eyes, Chronic Diastolic CHF, HTN, HLD, CKD stage IIIb, pancreatitis, Hx colonic polyps  with a chief complaint of acute onset aphasia and right-sided weakness.  This started while she was at church and occurred less than an hour ago.  Patient arrived as a code stroke.  Level 5 caveat acuity of condition.   Subjective: 11/16 A/O x4, currently eating lunch with the help of her daughter.  Has consumed~percent of her lunch.  Per her daughter also consumed the majority of her breakfast without problem.    Assessment & Plan: Covid vaccination; vaccinated   Active Problems:   ICH (intracerebral hemorrhage) (HCC)   Morbid obesity (HCC)   Chronic diastolic congestive heart failure (HCC)   Uncontrolled type 2 diabetes mellitus with hyperglycemia (HCC)   Essential hypertension   FUO (fever of unknown origin)   CKD (chronic kidney disease), stage III (HCC)   LEFT basal ganglia hemorrhagic CVA -Secondary to HTN -Code Stroke CT head L thalamic and basal ganglia IPH w/ local mass effect and partial effacement 3rd ventricle w/ 41mm midline shift. Small vessel disease. Atrophy. Old pontine lacune. Anterior R frontal lobe meningioma vs calcification.  -CT head after intubation unchanged -MRI  No underlying basal ganglia lesion. Extensive small vessel disease.  -MRA  Intracranial atherosclerosis.  -Carotid Doppler bilateral 1-39% carotid stenosis. -2D Echo ejection fraction 55 to 60%. -LDL UTC as TG > 400 mg/dL, direct LDL 400.8  -[id number] 8.6 -ASA 81 mg daily -CIR recommended -11/16 EEG negative see results below -11/16 repeat CT head; no change see results below -11/16 neurology signed off, patient to follow-up in stroke clinic in 4 weeks  Acute respiratory failure with hypoxia and  hypercapnia -Secondary stroke -Intubated in ED>>>> 10/9 -High risk for reintubation -11/16 PCXR; bibasilar clearing see results below  Tracheobronchitis vs PNA -Leukocytosis resolved -Afebrile overnight  -Neurology recommended completion of 5-day course of ceftriaxone 11/11>>> 11/16  Essential HTN -On admission BP 171/157 -Cleviprex drip -Furosemide 20 mg daily -11/16 increase Hydralazine 100 mg TID -Hydrochlorothiazide 25 mg daily -Labetalol PRN -Losartan 50 mg BID -Metoprolol 100 mg BID   HLD -Lipitor 80 mg daily (hold) in setting of acute hemorrhage. -11/7 TG= 654.  LDL =UTC, direct LDL= 121.8 -Resume statin at discharge  DM type II uncontrolled with Hyperglycemia -11/7 hemoglobin A1c= 8.6 -Lantus 50 units BID -NovoLog 8 units q 4 hr -Resistant SSI -11/13 consult diabetic coordinator; uncontrolled diabetic with stroke -11/13 consult nutrition; uncontrolled diabetic with stroke.  Educate on diet  Dysphagia -11/16 continue core track tube until MRI obtained -Continue nighttime tube feeds -11/16 when patient consuming at least 75% of each meal discontinue core track tube  Morbidly obese -See diabetes  CKD stage IIIb (baseline Cr 1.2)  Lab Results  Component Value Date   CREATININE 1.27 (H) 01/13/2020   CREATININE 1.28 (H) 01/12/2020   CREATININE 1.17 (H) 01/11/2020   CREATININE 1.22 (H) 01/10/2020   CREATININE 1.37 (H) 01/09/2020  -At baseline    DVT prophylaxis: SCD Code Status: DNR Family Communication: 11/16 daughter at bedside discussed plan of care answered all questions Status is: Inpatient    Dispo: The patient is from: Home              Anticipated d/c is to: SNF?  Anticipated d/c date is: Per neurology              Patient currently unstable      Consultants:  Neurology PCCM    Procedures/Significant Events:  11/8 echocardiogram;LVEF= 55 to 60%.  -Grade II diastolic dysfunction   11/16 EEG;No seizures or  epileptiform discharges 11/16 CT head W0 contrast;-subacute left thalamic hematoma.  No acute or interval finding. -Chronic small vessel ischemia 11/16 PCXR;-feeding tube noted with tip below left hemidiaphragm. -Low lung volumes with basilar atelectasis. Interim near complete clearing of left base infiltrate   I have personally reviewed and interpreted all radiology studies and my findings are as above.  VENTILATOR SETTINGS:    Cultures   Antimicrobials: Anti-infectives (From admission, onward)   Start     Ordered Stop   01/11/20 1000  cefTRIAXone (ROCEPHIN) 2 g in sodium chloride 0.9 % 100 mL IVPB        01/10/20 1128 01/13/20 0959   01/08/20 0900  cefTRIAXone (ROCEPHIN) 2 g in sodium chloride 0.9 % 100 mL IVPB  Status:  Discontinued        01/08/20 0801 01/10/20 1128       Devices    LINES / TUBES:      Continuous Infusions:    Objective: Vitals:   01/13/20 0011 01/13/20 0337 01/13/20 0733 01/13/20 1147  BP: (!) 181/57 (!) 187/59 (!) 159/53 (!) 136/50  Pulse: 74 77 75 62  Resp: Temp: 98.1 F (36.7 C) 98.1 F (36.7 C) 98.5 F (36.9 C) 98.7 F (37.1 C)  TempSrc: Oral Oral Oral Oral  SpO2: 95% 98% 95% 93%  Weight:      Height:        Intake/Output Summary (Last 24 hours) at 01/13/2020 1426 Last data filed at 01/13/2020 6578 Gross per 24 hour  Intake 320 ml  Output 400 ml  Net -80 ml   Filed Weights   01/04/20 1300 01/09/20 0426 01/10/20 0500  Weight: 105.8 kg 105.8 kg 104 kg    Examination:  General: A/O x4, No acute respiratory distress Eyes: negative scleral hemorrhage, negative anisocoria, negative icterus ENT: Negative Runny nose, negative gingival bleeding, Neck:  Negative scars, masses, torticollis, lymphadenopathy, JVD Lungs: Clear to auscultation bilaterally without wheezes or crackles Cardiovascular: Regular rate and rhythm without murmur gallop or rub normal S1 and S2 Abdomen: MORBIDLY OBESE, negative abdominal pain,  nondistended, positive soft, bowel sounds, no rebound, no ascites, no appreciable mass Extremities: No significant cyanosis, clubbing, or edema bilateral lower extremities Skin: Negative rashes, lesions, ulcers Psychiatric:  Negative depression, negative anxiety, negative fatigue, negative mania  Central nervous system:  Cranial nerves II through XII intact, tongue/uvula midline, LUE/LLE muscle strength 5/5, sensation intact throughout, RIGHT sided hemiparesis,  negative dysarthria, negative expressive aphasia, negative receptive aphasia.  .     Data Reviewed: Care during the described time interval was provided by me .  I have reviewed this patient's available data, including medical history, events of note, physical examination, and all test results as part of my evaluation.  CBC: Recent Labs  Lab 01/09/20 0304 01/10/20 0617 01/11/20 0109 01/12/20 0448 01/13/20 0420  WBC 9.1 9.7 11.2* 11.5* 9.7  NEUTROABS 6.2  --  7.8* 7.9* 6.5  HGB 12.9 13.0 13.4 13.5 14.2  HCT 40.6 41.4 42.6 41.9 44.5  MCV 94.0 93.9 93.2 92.5 94.5  PLT 192 211 221 251 225   Basic Metabolic Panel: Recent Labs  Lab 01/08/20 0250 01/08/20 0250  01/09/20 0304 01/10/20 0617 01/11/20 0109 01/12/20 0448 01/13/20 0420  NA 146*   < > 147* 144 144 141 142  K 4.0   < > 3.7 3.7 3.6 3.9 3.8  CL 104   < > 101 100 101 101 100  CO2 28   < > 34* 34* 34* 33* 30  GLUCOSE 181*   < > 236* 123* 130* 125* 178*  BUN 35*   < > 40* 41* 38* 45* 38*  CREATININE 1.20*   < > 1.37* 1.22* 1.17* 1.28* 1.27*  CALCIUM 9.0   < > 8.8* 8.4* 8.5* 8.3* 8.5*  MG 2.0  --  1.9  --  2.2 2.4 2.2  PHOS 2.3*  --  4.0  --  3.5 4.0 3.7   < > = values in this interval not displayed.   GFR: Estimated Creatinine Clearance: 37.3 mL/min (A) (by C-G formula based on SCr of 1.27 mg/dL (H)). Liver Function Tests: Recent Labs  Lab 01/11/20 0109 01/12/20 0448 01/13/20 0420  AST 47* 34 37  ALT 45* 37 36  ALKPHOS 54 54 52  BILITOT 0.5 0.4 0.5    PROT 6.1* 6.3* 6.2*  ALBUMIN 2.4* 2.5* 2.5*   No results for input(s): LIPASE, AMYLASE in the last 168 hours. Recent Labs  Lab 01/13/20 0716  AMMONIA 38*   Coagulation Profile: No results for input(s): INR, PROTIME in the last 168 hours. Cardiac Enzymes: No results for input(s): CKTOTAL, CKMB, CKMBINDEX, TROPONINI in the last 168 hours. BNP (last 3 results) No results for input(s): PROBNP in the last 8760 hours. HbA1C: No results for input(s): HGBA1C in the last 72 hours. CBG: Recent Labs  Lab 01/13/20 0005 01/13/20 0333 01/13/20 0532 01/13/20 0734 01/13/20 1150  GLUCAP 107* 166* 193* 125* 161*   Lipid Profile: No results for input(s): CHOL, HDL, LDLCALC, TRIG, CHOLHDL, LDLDIRECT in the last 72 hours. Thyroid Function Tests: No results for input(s): TSH, T4TOTAL, FREET4, T3FREE, THYROIDAB in the last 72 hours. Anemia Panel: No results for input(s): VITAMINB12, FOLATE, FERRITIN, TIBC, IRON, RETICCTPCT in the last 72 hours. Sepsis Labs: No results for input(s): PROCALCITON, LATICACIDVEN in the last 168 hours.  Recent Results (from the past 240 hour(s))  Respiratory Panel by RT PCR (Flu A&B, Covid) - Nasopharyngeal Swab     Status: None   Collection Time: 01/04/20  1:32 PM   Specimen: Nasopharyngeal Swab  Result Value Ref Range Status   SARS Coronavirus 2 by RT PCR NEGATIVE NEGATIVE Final    Comment: (NOTE) SARS-CoV-2 target nucleic acids are NOT DETECTED.  The SARS-CoV-2 RNA is generally detectable in upper respiratoy specimens during the acute phase of infection. The lowest concentration of SARS-CoV-2 viral copies this assay can detect is 131 copies/mL. A negative result does not preclude SARS-Cov-2 infection and should not be used as the sole basis for treatment or other patient management decisions. A negative result may occur with  improper specimen collection/handling, submission of specimen other than nasopharyngeal swab, presence of viral mutation(s) within  the areas targeted by this assay, and inadequate number of viral copies (<131 copies/mL). A negative result must be combined with clinical observations, patient history, and epidemiological information. The expected result is Negative.  Fact Sheet for Patients:  https://www.moore.com/  Fact Sheet for Healthcare Providers:  https://www.young.biz/  This test is no t yet approved or cleared by the [place] FDA and  has been authorized for detection and/or diagnosis of SARS-CoV-2 by FDA under an Emergency Use Authorization (EUA).  This EUA will remain  in effect (meaning this test can be used) for the duration of the COVID-19 declaration under Section 564(b)(1) of the Act, 21 U.S.C. section 360bbb-3(b)(1), unless the authorization is terminated or revoked sooner.     Influenza A by PCR NEGATIVE NEGATIVE Final   Influenza B by PCR NEGATIVE NEGATIVE Final    Comment: (NOTE) The Xpert Xpress SARS-CoV-2/FLU/RSV assay is intended as an aid in  the diagnosis of influenza from Nasopharyngeal swab specimens and  should not be used as a sole basis for treatment. Nasal washings and  aspirates are unacceptable for Xpert Xpress SARS-CoV-2/FLU/RSV  testing.  Fact Sheet for Patients: https://www.moore.com/  Fact Sheet for Healthcare Providers: https://www.young.biz/  This test is not yet approved or cleared by the [place] FDA and  has been authorized for detection and/or diagnosis of SARS-CoV-2 by  FDA under an Emergency Use Authorization (EUA). This EUA will remain  in effect (meaning this test can be used) for the duration of the  Covid-19 declaration under Section 564(b)(1) of the Act, 21  U.S.C. section 360bbb-3(b)(1), unless the authorization is  terminated or revoked. Performed at [facility], [address], [place], [place] [address]   MRSA PCR Screening     Status: None   Collection  Time: 01/04/20  3:27 PM   Specimen: Nasal Mucosa; Nasopharyngeal  Result Value Ref Range Status   MRSA by PCR NEGATIVE NEGATIVE Final    Comment:        The GeneXpert MRSA Assay (FDA approved for NASAL specimens only), is one component of a comprehensive MRSA colonization surveillance program. It is not intended to diagnose MRSA infection nor to guide or monitor treatment for MRSA infections. Performed at [facility], [address], [place], [place] [address]   Culture, blood (routine x 2)     Status: None   Collection Time: 01/07/20  9:33 AM   Specimen: BLOOD LEFT ARM  Result Value Ref Range Status   Specimen Description BLOOD LEFT ARM  Final   Special Requests   Final    BOTTLES DRAWN AEROBIC AND ANAEROBIC Blood Culture results may not be optimal due to an inadequate volume of blood received in culture bottles   Culture   Final    NO GROWTH 5 DAYS Performed at [facility], [address] [place], [place] [address]    Report Status 01/12/2020 FINAL  Final  Culture, blood (routine x 2)     Status: None   Collection Time: 01/07/20  9:35 AM   Specimen: BLOOD  Result Value Ref Range Status   Specimen Description BLOOD LEFT ANTECUBITAL  Final   Special Requests   Final    BOTTLES DRAWN AEROBIC AND ANAEROBIC Blood Culture adequate volume   Culture   Final    NO GROWTH 5 DAYS Performed at [facility], [address] [place], [place] [address]    Report Status 01/12/2020 FINAL  Final  Culture, respiratory (non-expectorated)     Status: None   Collection Time: 01/07/20 10:31 PM   Specimen: Tracheal Aspirate; Respiratory  Result Value Ref Range Status   Specimen Description TRACHEAL ASPIRATE  Final   Special Requests NONE  Final   Gram Stain   Final    RARE WBC PRESENT, PREDOMINANTLY PMN MODERATE SQUAMOUS EPITHELIAL CELLS PRESENT ABUNDANT GRAM POSITIVE COCCI ABUNDANT GRAM POSITIVE RODS RARE GRAM NEGATIVE RODS    Culture   Final    FEW Normal  respiratory flora-no Staph aureus or Pseudomonas seen Performed at [facility], [address] [place], [place] [address]    Report Status 01/10/2020 FINAL  Final         Radiology Studies: DG Abd 1 View  Result Date: 01/13/2020 CLINICAL DATA:  Abdominal tenderness. EXAM: ABDOMEN - 1 VIEW COMPARISON:  CT 06/11/2019. FINDINGS: Surgical clips right upper quadrant. Feeding tube noted with tip over the distal stomach. No bowel distention. Barium noted throughout the colon. No free air. Degenerative change lumbar spine. IMPRESSION: Feeding tube noted with tip over the distal stomach. No bowel distention. Barium noted throughout the colon. Electronically Signed   By: Maisie Fushomas  Register   On: 01/13/2020 06:37   CT HEAD WO CONTRAST  Result Date: 01/13/2020 CLINICAL DATA:  Mental status change with unknown cause EXAM: CT HEAD WITHOUT CONTRAST TECHNIQUE: Contiguous axial images were obtained from the base of the skull through the vertex without intravenous contrast. COMPARISON:  01/04/2020 FINDINGS: Brain: Left thalamic hematoma which measures 25 x 14 mm on coronal reformats, essentially size stable and with stable shape. Thin rim of adjacent edema, expected. The hematoma has more indistinct margination, expected. No intraventricular extension or hydrocephalus. Confluent chronic small vessel ischemia in the cerebral white matter. No evidence of gray matter infarction. Vascular: Atheromatous calcification. 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 Sinuses/Orbits: Negative for acute finding. Bilateral cataract resection IMPRESSION: 1. Subacute left thalamic hematoma.  No acute or interval finding. 2. Chronic small vessel ischemia. Electronically Signed   By: Marnee SpringJonathon  Watts M.D.   On: 01/13/2020 06:18   DG CHEST PORT 1 VIEW  Result Date: 01/13/2020 CLINICAL DATA:  Altered mental status. EXAM: PORTABLE CHEST 1 VIEW COMPARISON:  Chest x-ray 01/08/2020. FINDINGS: Feeding tube noted with tip below left  hemidiaphragm. Heart size normal. Low lung volumes with basilar atelectasis. Interim near complete clearing of left base infiltrate. No pleural effusion or pneumothorax. Barium noted the colon. IMPRESSION: 1. Feeding tube noted with tip below left hemidiaphragm. 2. Low lung volumes with basilar atelectasis. Interim near complete clearing of left base infiltrate. Electronically Signed   By: Maisie Fushomas  Register   On: 01/13/2020 07:30   DG Swallowing Func-Speech Pathology  Result Date: 01/12/2020 Objective Swallowing Evaluation: Type of Study: Bedside Swallow Evaluation  Patient Details Name: Mikael SprayCornelia C Vollmer MRN: [id number] Date of Birth: [birth year] Today's Date: 01/12/2020 Time: SLP Start Time (ACUTE ONLY): 1340 -SLP Stop Time (ACUTE ONLY): 1400 SLP Time Calculation (min) (ACUTE ONLY): 20 min Past Medical History: Past Medical History: Diagnosis Date . Colon polyp  . Diabetes mellitus 1992 . Hypertension 1996 . Pancreatitis  Past Surgical History: Past Surgical History: Procedure Laterality Date . CHOLECYSTECTOMY   . COLONOSCOPY   . ERCP   . PARTIAL HYSTERECTOMY  [age]years old . POLYPECTOMY   . UPPER GASTROINTESTINAL ENDOSCOPY   . WRIST FRACTURE SURGERY  left arm,[age]years old HPI: The pt is an 86 yo female presenting with acute onset aphasia and R-sided weakness. Imaging revealed L basal ganglia/thalamic hemorrhage with 2mm midline shift and no intraventricular extension. The pt was intubated 11/7-11/9. PMH includes: DM II, HTN, CHF, and colon polyps.  Subjective: pleasant, alert, cooperative Assessment / Plan / Recommendation CHL IP CLINICAL IMPRESSIONS 01/12/2020 Clinical Impression Patient presents with a moderate oral and a mild-mod pharyngeal dyspahgia without aspiration or penetration of tested boluses observed. Patient with delays in oral anterior to posterior transit of all tested boluses (thin liquids, nectar thick liquids, puree solids, regular solids, barium tablet and  mechanical soft solids). This delay led  to premature spillage to vallecular sinus with thin liquids. She was unable to maintain bolus cohesion of barium tablet and thin liquids or puree solids and unable to transit barium tablet from anterior portion of oral cavity. During pharyngeal phase of swallow, patient exhibited swallow initiation delays at level of vallecular sinus with thin liquids via straw and cup. Decreased epiglottic inflection but overall good airway closure during swallows. Nectar thick liquids resulted in trace vallecular residuals, puree solids resulted in trace to mild vallecular residuals and regular and mechanical soft solids resulted in mild-mod vallecular residuals. Majority of pharyngeal residuals cleared after subsequent swallows. Patient is safe to initiate oral diet of Dys 1, thin liquids if fully alert and with full supervision. SLP Visit Diagnosis Dysphagia, oropharyngeal phase (R13.12) Attention and concentration deficit following -- Frontal lobe and executive function deficit following -- Impact on safety and function Mild aspiration risk   CHL IP TREATMENT RECOMMENDATION 01/12/2020 Treatment Recommendations Therapy as outlined in treatment plan below   Prognosis 01/07/2020 Prognosis for Safe Diet Advancement Good Barriers to Reach Goals Cognitive deficits;Language deficits;Severity of deficits Barriers/Prognosis Comment -- CHL IP DIET RECOMMENDATION 01/12/2020 SLP Diet Recommendations Thin liquid;Dysphagia 1 (Puree) solids Liquid Administration via Cup;Straw Medication Administration Crushed with puree Compensations Minimize environmental distractions;Slow rate;Small sips/bites;Monitor for anterior loss;Lingual sweep for clearance of pocketing Postural Changes --   CHL IP OTHER RECOMMENDATIONS 01/12/2020 Recommended Consults -- Oral Care Recommendations Oral care BID Other Recommendations --   CHL IP FOLLOW UP RECOMMENDATIONS 01/12/2020 Follow up Recommendations Inpatient Rehab   CHL IP FREQUENCY AND DURATION 01/12/2020  Speech Therapy Frequency (ACUTE ONLY) min 2x/week Treatment Duration 2 weeks      CHL IP ORAL PHASE 01/12/2020 Oral Phase Impaired Oral - Pudding Teaspoon -- Oral - Pudding Cup -- Oral - Honey Teaspoon -- Oral - Honey Cup -- Oral - Nectar Teaspoon -- Oral - Nectar Cup Weak lingual manipulation;Reduced posterior propulsion;Delayed oral transit Oral - Nectar Straw -- Oral - Thin Teaspoon -- Oral - Thin Cup Premature spillage;Weak lingual manipulation;Delayed oral transit;Reduced posterior propulsion Oral - Thin Straw Weak lingual manipulation;Delayed oral transit;Reduced posterior propulsion;Premature spillage Oral - Puree Weak lingual manipulation;Delayed oral transit;Reduced posterior propulsion Oral - Mech Soft Impaired mastication;Weak lingual manipulation;Delayed oral transit;Reduced posterior propulsion Oral - Regular Reduced posterior propulsion;Delayed oral transit;Impaired mastication;Weak lingual manipulation Oral - Multi-Consistency -- Oral - Pill Decreased bolus cohesion;Delayed oral transit;Reduced posterior propulsion Oral Phase - Comment --  CHL IP PHARYNGEAL PHASE 01/12/2020 Pharyngeal Phase Impaired Pharyngeal- Pudding Teaspoon -- Pharyngeal -- Pharyngeal- Pudding Cup -- Pharyngeal -- Pharyngeal- Honey Teaspoon -- Pharyngeal -- Pharyngeal- Honey Cup -- Pharyngeal -- Pharyngeal- Nectar Teaspoon -- Pharyngeal -- Pharyngeal- Nectar Cup Pharyngeal residue - valleculae;Reduced epiglottic inversion Pharyngeal -- Pharyngeal- Nectar Straw -- Pharyngeal -- Pharyngeal- Thin Teaspoon -- Pharyngeal -- Pharyngeal- Thin Cup Delayed swallow initiation-vallecula;Reduced epiglottic inversion;Pharyngeal residue - valleculae;Pharyngeal residue - pyriform;Lateral channel residue Pharyngeal -- Pharyngeal- Thin Straw Delayed swallow initiation-vallecula;Reduced epiglottic inversion;Pharyngeal residue - valleculae;Pharyngeal residue - pyriform Pharyngeal -- Pharyngeal- Puree Pharyngeal residue - valleculae Pharyngeal --  Pharyngeal- Mechanical Soft Delayed swallow initiation-vallecula Pharyngeal -- Pharyngeal- Regular Delayed swallow initiation-vallecula Pharyngeal -- Pharyngeal- Multi-consistency -- Pharyngeal -- Pharyngeal- Pill (No Data) Pharyngeal -- Pharyngeal Comment --  CHL IP CERVICAL ESOPHAGEAL PHASE 01/12/2020 Cervical Esophageal Phase WFL Pudding Teaspoon -- Pudding Cup -- Honey Teaspoon -- Honey Cup -- Nectar Teaspoon -- Nectar Cup -- Nectar Straw -- Thin Teaspoon -- Thin Cup -- Thin Straw -- Puree --  Mechanical Soft -- Regular -- Multi-consistency -- Pill -- Cervical Esophageal Comment -- Angela Nevin, MA, CCC-SLP Speech Therapy [facility]             EEG adult  Result Date: 01/13/2020 Charlsie Quest, MD     01/13/2020 10:56 AM Patient Name: SHERELLE CASTELLI MRN: [id number] Epilepsy Attending: Charlsie Quest Referring Physician/Provider: Dr. Milon Dikes Date: 01/13/2020 Duration: 24.25 minutes Patient history: 86 year old female presented with aphasia and right-sided weakness.  EEG to evaluate for seizures. Level of alertness: Awake AEDs during EEG study: None Technical aspects: This EEG study was done with scalp electrodes positioned according to the 10-20 International system of electrode placement. Electrical activity was acquired at a sampling rate of 500Hz  and reviewed with a high frequency filter of 70Hz  and a low frequency filter of 1Hz . EEG data were recorded continuously and digitally stored. Description: The posterior dominant rhythm consists of 8 Hz activity of moderate voltage (25-35 uV) seen predominantly in posterior head regions, symmetric and reactive to eye opening and eye closing.  Hyperventilation and photic stimulation were not performed.   IMPRESSION: This study is within normal limits. No seizures or epileptiform discharges were seen throughout the recording. Priyanka        Scheduled Meds: . chlorhexidine  15 mL Mouth Rinse BID  . Chlorhexidine Gluconate Cloth  6  each Topical Daily  . docusate  100 mg Per Tube BID  . feeding supplement  237 mL Oral BID BM  . feeding supplement (GLUCERNA 1.2 CAL)  960 mL Per Tube Q24H  . feeding supplement (PROSource TF)  90 mL Per Tube BID  . free water  200 mL Per Tube Q6H  . furosemide  20 mg Per Tube Daily  . heparin injection (subcutaneous)  5,000 Units Subcutaneous Q8H  . hydrALAZINE  75 mg Per Tube Q6H  . hydrochlorothiazide  25 mg Per Tube Daily  . insulin aspart  0-20 Units Subcutaneous Q4H  . insulin aspart  8 Units Subcutaneous Q4H  . insulin glargine  50 Units Subcutaneous BID  . losartan  50 mg Per Tube BID  . mouth rinse  15 mL Mouth Rinse q12n4p  . metoprolol tartrate  100 mg Per Tube BID  . pantoprazole sodium  40 mg Per Tube Daily  . polyethylene glycol  17 g Per Tube Daily  . senna-docusate  1 tablet Per Tube BID   Continuous Infusions:    LOS: 9 days    Time spent:40 min    Cylie Dor, , MD [facility] Pager [phone]  If 7PM-7AM, please contact night-coverage [email or web address] Password [email or web address] 01/13/2020, 2:26 PM

## 2020-01-13 NOTE — Procedures (Signed)
Patient Name: [name] [name]  MRN: [id number]  Epilepsy Attending: Charlsie Quest  Referring Physician/Provider: Dr. Milon Dikes Date: 01/13/2020 Duration: 24.25 minutes  Patient history: 86 year old female presented with aphasia and right-sided weakness.  EEG to evaluate for seizures.  Level of alertness: Awake  AEDs during EEG study: None  Technical aspects: This EEG study was done with scalp electrodes positioned according to the 10-20 International system of electrode placement. Electrical activity was acquired at a sampling rate of 500Hz  and reviewed with a high frequency filter of 70Hz  and a low frequency filter of 1Hz . EEG data were recorded continuously and digitally stored.   Description: The posterior dominant rhythm consists of 8 Hz activity of moderate voltage (25-35 uV) seen predominantly in posterior head regions, symmetric and reactive to eye opening and eye closing.  Hyperventilation and photic stimulation were not performed.     IMPRESSION: This study is within normal limits. No seizures or epileptiform discharges were seen throughout the recording.  [name] [name] 

## 2020-01-13 NOTE — Progress Notes (Signed)
Nutrition Follow-up  DOCUMENTATION CODES:   Obesity unspecified  INTERVENTION:   Encourage PO intake as appropriate.   Ensure Enlive po BID, each supplement provides 350 kcal and 20 grams of protein  Continue with nocturnal feedings via Cortrak NGT using Glucerna 1.2 cal formula at goal rate of 80 ml/hr x 12 hours (7pm-7am).   Provide 90 ml Prosource TF BID per tube.   Tube feeding regimen provides 1312 kcal (77% of kcal needs), 102 grams of protein (100% of protein needs), and 778 ml free water.  NUTRITION DIAGNOSIS:   Inadequate oral intake related to inability to eat as evidenced by NPO status.  Progressing   GOAL:   Patient will meet greater than or equal to 90% of their needs  Progressing   MONITOR:   PO intake, Supplement acceptance, Diet advancement, Skin, Weight trends, Labs, I & O's, TF tolerance  REASON FOR ASSESSMENT:   Consult, Ventilator Enteral/tube feeding initiation and management  ASSESSMENT:   Pt with PMH of DM and HTN admitted with L basal ganglia/thalamic intraparenchymal hemorrhage with 2 mm midline shift. Pt intubated for airway protection.  Pt awake and alert during RD visit. Family at bedside. Pt and family unaware of diet change to dysphagia 1. Education was provided on what items pt could have on DYS 1 diet. Pt stated items she would like to eat (mac and cheese, chicken breast). Lunch tray ordered by family member at bedside during visit.    Pt tolerating nocturnal TF and receptive to increasing po intake.  Continue to monitor po intake as appropriate. Continue Ensure Enlive BID to increase calorie and protein needs.    Diet Order:   Diet Order            DIET - DYS 1 Room service appropriate? Yes with Assist; Fluid consistency: Thin  Diet effective now                 EDUCATION NEEDS:   Not appropriate for education at this time  Skin:  Skin Assessment: Reviewed RN Assessment  Last BM:  11/15  Height:   Ht Readings from  Last 1 Encounters:  01/04/20 _0  (1.6 m)    Weight:   Wt Readings from Last 1 Encounters:  01/10/20 104 kg    Ideal Body Weight:  52.2 kg  BMI:  Body mass index is 40.61 kg/m.  Estimated Nutritional Needs:   Kcal:  1700-1900  Protein:  100-120 grams  Fluid:  > 1.7 L/day    Ronnald Nian, Dietetic Intern Pager: [phone] If unavailable: [phone]

## 2020-01-13 NOTE — Progress Notes (Signed)
Inpatient Rehab Admissions Coordinator:   Met with patient at bedside (no family present).  She is lethargic.  Does wake to light touch, but does not interact much with me.  I will be following for my colleague, Clemens Catholic, for the remainder of the week.   Shann Medal, PT, DPT Admissions Coordinator [phone] 01/13/20  3:22 PM

## 2020-01-14 DIAGNOSIS — N1831 Chronic kidney disease, stage 3a: Secondary | ICD-10-CM | POA: Diagnosis not present

## 2020-01-14 DIAGNOSIS — I5032 Chronic diastolic (congestive) heart failure: Secondary | ICD-10-CM | POA: Diagnosis not present

## 2020-01-14 DIAGNOSIS — I10 Essential (primary) hypertension: Secondary | ICD-10-CM | POA: Diagnosis not present

## 2020-01-14 LAB — COMPREHENSIVE METABOLIC PANEL
ALT: 39 U/L (ref 0–44)
AST: 41 U/L (ref 15–41)
Albumin: 2.7 g/dL — ABNORMAL LOW (ref 3.5–5.0)
Alkaline Phosphatase: 55 U/L (ref 38–126)
Anion gap: 12 (ref 5–15)
BUN: 42 mg/dL — ABNORMAL HIGH (ref 8–23)
CO2: 26 mmol/L (ref 22–32)
Calcium: 8.4 mg/dL — ABNORMAL LOW (ref 8.9–10.3)
Chloride: 101 mmol/L (ref 98–111)
Creatinine, Ser: 1.32 mg/dL — ABNORMAL HIGH (ref 0.44–1.00)
GFR, Estimated: 40 mL/min — ABNORMAL LOW (ref >60)
Glucose, Bld: 166 mg/dL — ABNORMAL HIGH (ref 70–99)
Potassium: 3.6 mmol/L (ref 3.5–5.1)
Sodium: 139 mmol/L (ref 135–145)
Total Bilirubin: 0.7 mg/dL (ref 0.3–1.2)
Total Protein: 6.3 g/dL — ABNORMAL LOW (ref 6.5–8.1)

## 2020-01-14 LAB — CBC WITH DIFFERENTIAL/PLATELET
Abs Immature Granulocytes: 0.25 10*3/uL — ABNORMAL HIGH (ref 0.00–0.07)
Basophils Absolute: 0.1 10*3/uL (ref 0.0–0.1)
Basophils Relative: 1 %
Eosinophils Absolute: 0.3 10*3/uL (ref 0.0–0.5)
Eosinophils Relative: 3 %
HCT: 41.3 % (ref 36.0–46.0)
Hemoglobin: 13.2 g/dL (ref 12.0–15.0)
Immature Granulocytes: 3 %
Lymphocytes Relative: 20 %
Lymphs Abs: 2 10*3/uL (ref 0.7–4.0)
MCH: 29.3 pg (ref 26.0–34.0)
MCHC: 32 g/dL (ref 30.0–36.0)
MCV: 91.8 fL (ref 80.0–100.0)
Monocytes Absolute: 1.1 10*3/uL — ABNORMAL HIGH (ref 0.1–1.0)
Monocytes Relative: 11 %
Neutro Abs: 6.3 10*3/uL (ref 1.7–7.7)
Neutrophils Relative %: 62 %
Platelets: 256 10*3/uL (ref 150–400)
RBC: 4.5 MIL/uL (ref 3.87–5.11)
RDW: 14.2 % (ref 11.5–15.5)
WBC: 10 10*3/uL (ref 4.0–10.5)
nRBC: 0.3 % — ABNORMAL HIGH (ref 0.0–0.2)

## 2020-01-14 LAB — GLUCOSE, CAPILLARY
Glucose-Capillary: 102 mg/dL — ABNORMAL HIGH (ref 70–99)
Glucose-Capillary: 104 mg/dL — ABNORMAL HIGH (ref 70–99)
Glucose-Capillary: 118 mg/dL — ABNORMAL HIGH (ref 70–99)
Glucose-Capillary: 147 mg/dL — ABNORMAL HIGH (ref 70–99)
Glucose-Capillary: 157 mg/dL — ABNORMAL HIGH (ref 70–99)
Glucose-Capillary: 164 mg/dL — ABNORMAL HIGH (ref 70–99)
Glucose-Capillary: 187 mg/dL — ABNORMAL HIGH (ref 70–99)

## 2020-01-14 LAB — PHOSPHORUS: Phosphorus: 3.9 mg/dL (ref 2.5–4.6)

## 2020-01-14 LAB — MAGNESIUM: Magnesium: 2.4 mg/dL (ref 1.7–2.4)

## 2020-01-14 NOTE — Progress Notes (Addendum)
PROGRESS NOTE  [name] [name] ZOX:[id number] DOB: June 08, 1932 DOA: 01/04/2020 PCP: Tracey HarriesBouska, David, MD   LOS: 10 days   Brief narrative: Patient is 86 years old female with past medical history of DM type II uncontrolled with  retinopathy, Chronic Diastolic CHF, HTN, HLD, CKD stage IIIb, pancreatitis, Hx colonic polyps here to hospital with acute onset of aphasia and right-sided weakness.  Presented to hospital within an hour as code stroke.  She was noted to have a acute intracranial hemorrhage and was admitted to the hospital.  Assessment/Plan:  Active Problems:   ICH (intracerebral hemorrhage) (HCC)   Morbid obesity (HCC)   Chronic diastolic congestive heart failure (HCC)   Uncontrolled type 2 diabetes mellitus with hyperglycemia (HCC)   Essential hypertension   FUO (fever of unknown origin)   CKD (chronic kidney disease), stage III (HCC)   Left basal ganglia hemorrhagic CVA.  Likely hypertensive.   CT head showed Left thalamic and basal ganglia intraparenchymal hemorrhage with local mass-effect and mild midline shift. MRI of the brain showed hematoma but no vascular lesion was notified.  MRA showed intracranial atherosclerosis.  Carotid duplex ultrasound with hemodynamically insignificant stenosis. 2D echocardiogram showed a preserved LV function of 55 to 60%.  Hemoglobin  A1c of 8.6.  Hold  aspirin.  Physical therapy has seen the patient and recommend CIR.  Neurology saw the patient during hospitalization and recommend stroke clinic follow-up in 4 weeks.  Acute respiratory failure with hypoxia and hypercapnia Improved secondary to stroke.  Patient was intubated in the ED on 12/06/2019.  At this time, has started to improve mentally.  High risk for reintubation though.  Chest x-ray with clearing of opacities from the lungs.  Currently on room air.  Tracheobronchitis vs PNA Improved at this time.  Leukocytosis has resolved.  Afebrile.  Patient has completed 5-day course of ceftriaxone  11/11>>> 11/16  Essential HTN On admission BP 171/157.  Initially needed antihypertensive drip.  Currently on hydralazine hydrochlorothiazide losartan and metoprolol.  Blood pressure has significantly improved.  Hyperlipidemia.  Patient was taking statin at home.  Currently on hold. Resume statin at discharge  DM type II uncontrolled with Hyperglycemia Latest hemoglobin C of 8.6.  Patient is currently on Lantus 50 twice daily NovoLog every 4 hourly and resistance scale insulin.  We will continue to monitor closely.   Dysphagia secondary to stroke. On cortrak tube at this time.  Continue nighttime tube feeding. Plan to discontinue cortrak tube tube when patient eats at least 75% of each meal. Dysphagia 1 diet.  Morbidly obese With benefit from weight loss as outpatient.  CKD stage IIIb (baseline Cr 1.2) continue to monitor.  DVT prophylaxis: heparin injection 5,000 Units Start: 01/10/20 2200 SCD's Start: 01/04/20 1437    Code Status: DNR  Family Communication: With the patient's son at bedside.  Status is: Inpatient  Remains inpatient appropriate because:IV treatments appropriate due to intensity of illness or inability to take PO, Inpatient level of care appropriate due to severity of illness and Need for CIR   Dispo: The patient is from: Home              Anticipated d/c is to: CIR              Anticipated d/c date is: 1 to 2 days              Patient currently is not medically stable to d/c.  Consultants:  Neurology  PCCM  Procedures:  Cortrak tube tube placement,  intubation and mechanical ventilation.  Antibiotics:  . [name] at this time  Anti-infectives (From admission, onward)   Start     Dose/Rate Route Frequency Ordered Stop   01/11/20 1000  cefTRIAXone (ROCEPHIN) 2 g in sodium chloride 0.9 % 100 mL IVPB        2 g 200 mL/hr over 30 Minutes Intravenous Daily 01/10/20 1128 01/12/20 1313   01/08/20 0900  cefTRIAXone (ROCEPHIN) 2 g in sodium  chloride 0.9 % 100 mL IVPB  Status:  Discontinued        2 g 200 mL/hr over 30 Minutes Intravenous Daily 01/08/20 0801 01/10/20 1128       Subjective: Today, patient was seen and examined at bedside.  Follows few commands, denies any pain, patient son at bedside.  Denies any nausea vomiting.  Stated that  she had eaten something in the morning.  Objective: Vitals:   01/14/20 1140 01/14/20 1555  BP: (!) 113/40 (!) 116/41  Pulse: 67 67  Resp: 16 18  Temp: 98.5 F (36.9 C) 98.5 F (36.9 C)  SpO2: 99% 98%    Intake/Output Summary (Last 24 hours) at 01/14/2020 1728 Last data filed at 01/14/2020 0930 Gross per 24 hour  Intake 3139.33 ml  Output 300 ml  Net 2839.33 ml   Filed Weights   01/09/20 0426 01/10/20 0500 01/14/20 0500  Weight: 105.8 kg 104 kg 104 kg   Body mass index is 40.61 kg/m.   Physical Exam: GENERAL: Patient is alert awake on verbal command, not in obvious distress.  On nasogastric tube, morbidly obese HENT: No scleral pallor or icterus. Pupils equally reactive to light. Oral mucosa is moist NECK: is supple, no gross swelling noted. CHEST: Clear to auscultation. No crackles or wheezes.  Diminished breath sounds bilaterally. CVS: S1 and S2 heard, no murmur. Regular rate and rhythm.  ABDOMEN: Soft, non-tender, bowel sounds are present. EXTREMITIES: Right upper extremity weakness.  Moves bilateral lower extremity CNS: Follows few commands, sitting up in chair, moves bilateral lower extremity, unable to move right upper extremity> right lower extremity SKIN: warm and dry without rashes.  Data Review: I have personally reviewed the following laboratory data and studies,  CBC: Recent Labs  Lab 01/09/20 0304 01/09/20 0304 01/10/20 0617 01/11/20 0109 01/12/20 0448 01/13/20 0420 01/14/20 0508  WBC 9.1   < > 9.7 11.2* 11.5* 9.7 10.0  NEUTROABS 6.2  --   --  7.8* 7.9* 6.5 6.3  HGB 12.9   < > 13.0 13.4 13.5 14.2 13.2  HCT 40.6   < > 41.4 42.6 41.9 44.5 41.3    MCV 94.0   < > 93.9 93.2 92.5 94.5 91.8  PLT 192   < > 211 221 251 225 256   < > = values in this interval not displayed.   Basic Metabolic Panel: Recent Labs  Lab 01/09/20 0304 01/09/20 0304 01/10/20 0617 01/11/20 0109 01/12/20 0448 01/13/20 0420 01/14/20 0625  NA 147*   < > 144 144 141 142 139  K 3.7   < > 3.7 3.6 3.9 3.8 3.6  CL 101   < > 100 101 101 100 101  CO2 34*   < > 34* 34* 33* 30 26  GLUCOSE 236*   < > 123* 130* 125* 178* 166*  BUN 40*   < > 41* 38* 45* 38* 42*  CREATININE 1.37*   < > 1.22* 1.17* 1.28* 1.27* 1.32*  CALCIUM 8.8*   < > 8.4* 8.5* 8.3* 8.5* 8.4*  MG 1.9  --   --  2.2 2.4 2.2 2.4  PHOS 4.0  --   --  3.5 4.0 3.7 3.9   < > = values in this interval not displayed.   Liver Function Tests: Recent Labs  Lab 01/11/20 0109 01/12/20 0448 01/13/20 0420 01/14/20 0625  AST 47* 34 37 41  ALT 45* 37 36 39  ALKPHOS 54 54 52 55  BILITOT 0.5 0.4 0.5 0.7  PROT 6.1* 6.3* 6.2* 6.3*  ALBUMIN 2.4* 2.5* 2.5* 2.7*   No results for input(s): LIPASE, AMYLASE in the last 168 hours. Recent Labs  Lab 01/13/20 0716  AMMONIA 38*   Cardiac Enzymes: No results for input(s): CKTOTAL, CKMB, CKMBINDEX, TROPONINI in the last 168 hours. BNP (last 3 results) No results for input(s): BNP in the last 8760 hours.  ProBNP (last 3 results) No results for input(s): PROBNP in the last 8760 hours.  CBG: Recent Labs  Lab 01/13/20 2319 01/14/20 0320 01/14/20 0752 01/14/20 1143 01/14/20 1558  GLUCAP 151* 118* 157* 187* 147*   Recent Results (from the past 240 hour(s))  Culture, blood (routine x 2)     Status: [name]   Collection Time: 01/07/20  9:33 AM   Specimen: BLOOD LEFT ARM  Result Value Ref Range Status   Specimen Description BLOOD LEFT ARM  Final   Special Requests   Final    BOTTLES DRAWN AEROBIC AND ANAEROBIC Blood Culture results may not be optimal due to an inadequate volume of blood received in culture bottles   Culture   Final    NO GROWTH 5 DAYS Performed  at [facility], [address], [place], [place] [address]    Report Status 01/12/2020 FINAL  Final  Culture, blood (routine x 2)     Status: [name]   Collection Time: 01/07/20  9:35 AM   Specimen: BLOOD  Result Value Ref Range Status   Specimen Description BLOOD LEFT ANTECUBITAL  Final   Special Requests   Final    BOTTLES DRAWN AEROBIC AND ANAEROBIC Blood Culture adequate volume   Culture   Final    NO GROWTH 5 DAYS Performed at [facility], [address] [place], [place] [address]    Report Status 01/12/2020 FINAL  Final  Culture, respiratory (non-expectorated)     Status: [name]   Collection Time: 01/07/20 10:31 PM   Specimen: Tracheal Aspirate; Respiratory  Result Value Ref Range Status   Specimen Description TRACHEAL ASPIRATE  Final   Special Requests [name]  Final   Gram Stain   Final    RARE WBC PRESENT, PREDOMINANTLY PMN MODERATE SQUAMOUS EPITHELIAL CELLS PRESENT ABUNDANT GRAM POSITIVE COCCI ABUNDANT GRAM POSITIVE RODS RARE GRAM NEGATIVE RODS    Culture   Final    FEW Normal respiratory flora-no Staph aureus or Pseudomonas seen Performed at [facility], [address], [place], [place] [address]    Report Status 01/10/2020 FINAL  Final  Urine Culture     Status: Abnormal (Preliminary result)   Collection Time: 01/13/20  4:29 PM   Specimen: Urine, Random  Result Value Ref Range Status   Specimen Description URINE, RANDOM  Final   Special Requests [name]  Final   Culture (A)  Final    40,000 COLONIES/mL ENTEROCOCCUS FAECIUM SUSCEPTIBILITIES TO FOLLOW Performed at [facility], [address] [place], [place] [address]    Report Status PENDING  Incomplete     Studies: DG Abd 1 View  Result Date: 01/13/2020 CLINICAL DATA:  Abdominal tenderness. EXAM: ABDOMEN - 1 VIEW COMPARISON:  CT 06/11/2019. FINDINGS: Surgical clips right upper quadrant. Feeding tube noted with tip over the distal stomach. No bowel distention. Barium noted throughout  the colon. No free air. Degenerative change lumbar spine. IMPRESSION: Feeding tube noted with tip over the distal stomach. No bowel distention. Barium noted throughout the colon. Electronically Signed   By: Maisie Fus  Register   On: 01/13/2020 06:37   CT HEAD WO CONTRAST  Result Date: 01/13/2020 CLINICAL DATA:  Mental status change with unknown cause EXAM: CT HEAD WITHOUT CONTRAST TECHNIQUE: Contiguous axial images were obtained from the base of the skull through the vertex without intravenous contrast. COMPARISON:  01/04/2020 FINDINGS: Brain: Left thalamic hematoma which measures 25 x 14 mm on coronal reformats, essentially size stable and with stable shape. Thin rim of adjacent edema, expected. The hematoma has more indistinct margination, expected. No intraventricular extension or hydrocephalus. Confluent chronic small vessel ischemia in the cerebral white matter. No evidence of gray matter infarction. Vascular: Atheromatous calcification. 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 Sinuses/Orbits: Negative for acute finding. Bilateral cataract resection IMPRESSION: 1. Subacute left thalamic hematoma.  No acute or interval finding. 2. Chronic small vessel ischemia. Electronically Signed   By: Marnee Spring M.D.   On: 01/13/2020 06:18   DG CHEST PORT 1 VIEW  Result Date: 01/13/2020 CLINICAL DATA:  Altered mental status. EXAM: PORTABLE CHEST 1 VIEW COMPARISON:  Chest x-ray 01/08/2020. FINDINGS: Feeding tube noted with tip below left hemidiaphragm. Heart size normal. Low lung volumes with basilar atelectasis. Interim near complete clearing of left base infiltrate. No pleural effusion or pneumothorax. Barium noted the colon. IMPRESSION: 1. Feeding tube noted with tip below left hemidiaphragm. 2. Low lung volumes with basilar atelectasis. Interim near complete clearing of left base infiltrate. Electronically Signed   By: Maisie Fus  Register   On: 01/13/2020 07:30   EEG adult  Result Date: 01/13/2020 Charlsie Quest, MD     01/13/2020 10:56 AM Patient Name: NAKEMA FAKE MRN: [id number] Epilepsy Attending: Charlsie Quest Referring Physician/Provider: Dr. Milon Dikes Date: 01/13/2020 Duration: 24.25 minutes Patient history: 86 year old female presented with aphasia and right-sided weakness.  EEG to evaluate for seizures. Level of alertness: Awake AEDs during EEG study: [name] Technical aspects: This EEG study was done with scalp electrodes positioned according to the 10-20 International system of electrode placement. Electrical activity was acquired at a sampling rate of 500Hz  and reviewed with a high frequency filter of 70Hz  and a low frequency filter of 1Hz . EEG data were recorded continuously and digitally stored. Description: The posterior dominant rhythm consists of 8 Hz activity of moderate voltage (25-35 uV) seen predominantly in posterior head regions, symmetric and reactive to eye opening and eye closing.  Hyperventilation and photic stimulation were not performed.   IMPRESSION: This study is within normal limits. No seizures or epileptiform discharges were seen throughout the recording. Priyanka , MD  [facility] 01/14/2020

## 2020-01-14 NOTE — Plan of Care (Signed)
Pt is alert and oriented. Tele in place. Pt is now on RA. No concerns over night. Edema to peri area and RUE. Bruises to the LUE. Pt refused SCD's and educated.                                             Problem: Education: Goal: Knowledge of General Education information will improve Description: Including pain rating scale, medication(s)/side effects and non-pharmacologic comfort measures Outcome: Progressing   Problem: Health Behavior/Discharge Planning: Goal: Ability to manage health-related needs will improve Outcome: Progressing   Problem: Clinical Measurements: Goal: Ability to maintain clinical measurements within normal limits will improve Outcome: Progressing Goal: Will remain free from infection Outcome: Progressing Goal: Diagnostic test results will improve Outcome: Progressing Goal: Respiratory complications will improve Outcome: Progressing Goal: Cardiovascular complication will be avoided Outcome: Progressing   Problem: Activity: Goal: Risk for activity intolerance will decrease Outcome: Progressing   Problem: Nutrition: Goal: Adequate nutrition will be maintained Outcome: Progressing   Problem: Coping: Goal: Level of anxiety will decrease Outcome: Progressing   Problem: Elimination: Goal: Will not experience complications related to bowel motility Outcome: Progressing Goal: Will not experience complications related to urinary retention Outcome: Progressing   Problem: Pain Managment: Goal: General experience of comfort will improve Outcome: Progressing   Problem: Safety: Goal: Ability to remain free from injury will improve Outcome: Progressing   Problem: Skin Integrity: Goal: Risk for impaired skin integrity will decrease Outcome: Progressing   Problem: Activity: Goal: Ability to tolerate increased activity will improve Outcome: Progressing   Problem: Respiratory: Goal: Ability to maintain a clear airway and adequate ventilation will  improve Outcome: Progressing   Problem: Role Relationship: Goal: Method of communication will improve Outcome: Progressing   Problem: Education: Goal: Knowledge of disease or condition will improve Outcome: Progressing Goal: Knowledge of secondary prevention will improve Outcome: Progressing Goal: Knowledge of patient specific risk factors addressed and post discharge goals established will improve Outcome: Progressing Goal: Individualized Educational Video(s) Outcome: Progressing   Problem: Self-Care: Goal: Ability to participate in self-care as condition permits will improve Outcome: Progressing Goal: Ability to communicate needs accurately will improve Outcome: Progressing   Problem: Nutrition: Goal: Risk of aspiration will decrease Outcome: Progressing   Problem: Intracerebral Hemorrhage Tissue Perfusion: Goal: Complications of Intracerebral Hemorrhage will be minimized Outcome: Progressing

## 2020-01-14 NOTE — Progress Notes (Signed)
Inpatient Rehab Admissions Coordinator:   I have insurance authorization for CIR, but do not have a bed available for this patient to admit today.  Will continue to follow for timing of possible admission pending bed availability.   Estill Dooms, PT, DPT Admissions Coordinator [phone] 01/14/20  2:27 PM

## 2020-01-14 NOTE — Progress Notes (Signed)
Occupational Therapy Treatment Patient Details Name: [name] [name] MRN: [id number] DOB: 23-Mar-1932 Today's Date: 01/14/2020    History of present illness The pt is an 86 yo female presenting with acute onset aphasia and R-sided weakness. Imaging revealed L basal ganglia/thalamic hemorrhage with 40mm midline shift and no intraventricular extension. The pt was intubated 11/7. PMH includes: DM II, HTN, CHF, and colon polyps. pt extubated 11/9.   OT comments  Patient sitting in chair and reporting fatigue when therapist entered the room. Per nursing and family patient just finished with extended amount of time on [facility] and pericare of approximately an hour in length. Patient drowsy and fatigued - requiring verbal cues to keep her eyes open and multimodal cues to keep her attention during treatment. Patient's RUE provided with PROM to maintain ROM and assist with edema management. Patient's son shown how to perform  ROM while patient in seated position. Retrograde massage to right hand and education to family on how to perform. Attempts to elicit movement with active assist ROM and muscle tapping without significant results. Patient reports improved sensation in UE. Patient exhibited poor activity tolerance, less attentive to right side, and difficulty sustaining upright sitting in chair. Patient breathing heavy with minimal tasks and o2 sat monitored at 96%. Due to patient's decreased level of alertness and complaints of fatigue BP checked. Patient's BP 104/43. Patient's feet elevated and patient placed in more reclined position. BP 107/35. RN notified of patient's vs and level of arousal.    Follow Up Recommendations  CIR;Supervision/Assistance - 24 hour    Equipment Recommendations  Other (comment) (Defer to Next Venue)    Recommendations for Other Services      Precautions / Restrictions Precautions Precautions: Fall Precaution Comments: R-sided hemiparesis, NG tube Restrictions Weight  Bearing Restrictions: No       Mobility Bed Mobility                  Transfers                      Balance Overall balance assessment: Needs assistance Sitting-balance support: Feet supported;No upper extremity supported Sitting balance-Leahy Scale: Fair Sitting balance - Comments: Able to lean forward and maintain upright position for limited amount of time, then leaning back in recliner.                                   ADL either performed or assessed with clinical judgement   ADL                                               Vision   Vision Assessment?: Vision impaired- to be further tested in functional context Additional Comments: Inattentive to right side   Perception     Praxis      Cognition Arousal/Alertness: Lethargic Behavior During Therapy: WFL for tasks assessed/performed Overall Cognitive Status: Difficult to assess                                          Exercises Other Exercises Other Exercises: PROM fingers, wrist, forearm, elbow, shoulder to 90 degrees. Attempted to elicit movement from RUE - with only trace  movement noted in fingers. Educated son on how to perform PROM of RUE. Other Exercises: Retrograde massage to hand to reduce edema. Educated son and recommended use of lotion.   Shoulder Instructions       General Comments      Pertinent Vitals/ Pain       Pain Assessment: No/denies pain  Home Living                                          Prior Functioning/Environment              Frequency  Min 2X/week        Progress Toward Goals  OT Goals(current goals can now be found in the care plan section)  Progress towards OT goals: OT to reassess next treatment  Acute Rehab OT Goals Patient Stated Goal: Be home for Christmas OT Goal Formulation: With patient/family Time For Goal Achievement: 01/19/20 Potential to Achieve Goals: Good   Plan Discharge plan remains appropriate;Frequency remains appropriate    Co-evaluation          OT goals addressed during session: Strengthening/ROM      AM-PAC OT "6 Clicks" Daily Activity     Outcome Measure   Help from another person eating meals?: Total Help from another person taking care of personal grooming?: Total   Help from another person bathing (including washing, rinsing, drying)?: Total Help from another person to put on and taking off regular upper body clothing?: Total Help from another person to put on and taking off regular lower body clothing?: Total 6 Click Score: 5    End of Session Equipment Utilized During Treatment: Gait belt  OT Visit Diagnosis: Muscle weakness (generalized) (M62.81);Other abnormalities of gait and mobility (R26.89);Other symptoms and signs involving cognitive function;Hemiplegia and hemiparesis;Low vision, both eyes (H54.2)   Activity Tolerance Patient limited by fatigue;Other (comment) (low BP)   Patient Left in chair;with call bell/phone within reach;with chair alarm set;with family/visitor present   Nurse Communication  (Low BP, level of arousal)        Time: 7628-3151 OT Time Calculation (min): 32 min  Charges: OT General Charges $OT Visit: 1 Visit OT Treatments $Neuromuscular Re-education: 8-22 mins $Therapeutic Exercise: 8-22 mins  Waldron Session, OTR/L Acute Care Rehab Services  Office [phone] Pager: [phone]    Kelli Churn 01/14/2020, 2:00 PM

## 2020-01-15 DIAGNOSIS — I10 Essential (primary) hypertension: Secondary | ICD-10-CM | POA: Diagnosis not present

## 2020-01-15 DIAGNOSIS — N1831 Chronic kidney disease, stage 3a: Secondary | ICD-10-CM | POA: Diagnosis not present

## 2020-01-15 DIAGNOSIS — I5032 Chronic diastolic (congestive) heart failure: Secondary | ICD-10-CM | POA: Diagnosis not present

## 2020-01-15 LAB — COMPREHENSIVE METABOLIC PANEL
ALT: 41 U/L (ref 0–44)
AST: 44 U/L — ABNORMAL HIGH (ref 15–41)
Albumin: 2.6 g/dL — ABNORMAL LOW (ref 3.5–5.0)
Alkaline Phosphatase: 51 U/L (ref 38–126)
Anion gap: 12 (ref 5–15)
BUN: 68 mg/dL — ABNORMAL HIGH (ref 8–23)
CO2: 27 mmol/L (ref 22–32)
Calcium: 8.2 mg/dL — ABNORMAL LOW (ref 8.9–10.3)
Chloride: 98 mmol/L (ref 98–111)
Creatinine, Ser: 1.74 mg/dL — ABNORMAL HIGH (ref 0.44–1.00)
GFR, Estimated: 28 mL/min — ABNORMAL LOW (ref >60)
Glucose, Bld: 179 mg/dL — ABNORMAL HIGH (ref 70–99)
Potassium: 3.6 mmol/L (ref 3.5–5.1)
Sodium: 137 mmol/L (ref 135–145)
Total Bilirubin: 0.4 mg/dL (ref 0.3–1.2)
Total Protein: 6 g/dL — ABNORMAL LOW (ref 6.5–8.1)

## 2020-01-15 LAB — URINE CULTURE: Culture: 40000 — AB

## 2020-01-15 LAB — CBC WITH DIFFERENTIAL/PLATELET
Abs Immature Granulocytes: 0.27 10*3/uL — ABNORMAL HIGH (ref 0.00–0.07)
Basophils Absolute: 0.1 10*3/uL (ref 0.0–0.1)
Basophils Relative: 1 %
Eosinophils Absolute: 0.3 10*3/uL (ref 0.0–0.5)
Eosinophils Relative: 3 %
HCT: 39.9 % (ref 36.0–46.0)
Hemoglobin: 12.7 g/dL (ref 12.0–15.0)
Immature Granulocytes: 3 %
Lymphocytes Relative: 17 %
Lymphs Abs: 1.7 10*3/uL (ref 0.7–4.0)
MCH: 29.5 pg (ref 26.0–34.0)
MCHC: 31.8 g/dL (ref 30.0–36.0)
MCV: 92.8 fL (ref 80.0–100.0)
Monocytes Absolute: 1 10*3/uL (ref 0.1–1.0)
Monocytes Relative: 10 %
Neutro Abs: 6.5 10*3/uL (ref 1.7–7.7)
Neutrophils Relative %: 66 %
Platelets: 241 10*3/uL (ref 150–400)
RBC: 4.3 MIL/uL (ref 3.87–5.11)
RDW: 14.6 % (ref 11.5–15.5)
WBC: 9.8 10*3/uL (ref 4.0–10.5)
nRBC: 0 % (ref 0.0–0.2)

## 2020-01-15 LAB — GLUCOSE, CAPILLARY
Glucose-Capillary: 186 mg/dL — ABNORMAL HIGH (ref 70–99)
Glucose-Capillary: 171 mg/dL — ABNORMAL HIGH (ref 70–99)
Glucose-Capillary: 209 mg/dL — ABNORMAL HIGH (ref 70–99)
Glucose-Capillary: 149 mg/dL — ABNORMAL HIGH (ref 70–99)
Glucose-Capillary: 134 mg/dL — ABNORMAL HIGH (ref 70–99)

## 2020-01-15 LAB — PHOSPHORUS: Phosphorus: 5 mg/dL — ABNORMAL HIGH (ref 2.5–4.6)

## 2020-01-15 LAB — MAGNESIUM: Magnesium: 2.5 mg/dL — ABNORMAL HIGH (ref 1.7–2.4)

## 2020-01-15 MED ORDER — HYDRALAZINE HCL 50 MG PO TABS
100.0000 mg | ORAL_TABLET | Freq: Three times a day (TID) | ORAL | Status: DC
  Administered 2020-01-16: 100 mg
  Filled 2020-01-15: qty 2

## 2020-01-15 NOTE — Progress Notes (Signed)
Inpatient Rehab Admissions Coordinator:   I have no beds available for this patient to admit to CIR today.  Will continue to follow for timing of potential admission pending bed availability.   Estill Dooms, PT, DPT Admissions Coordinator [phone] 01/15/20  10:03 AM

## 2020-01-15 NOTE — Progress Notes (Addendum)
PROGRESS NOTE  [name] [name] LFY:[id number] DOB: [birth year] DOA: 01/04/2020 PCP: [name], MD   LOS: 11 days   Brief narrative: Patient is 86 years old female with past medical history of DM type II uncontrolled with  retinopathy, Chronic diastolic CHF, HTN, HLD, CKD stage IIIb, pancreatitis, Hx colonic polyps presented to the hospital with acute onset of aphasia and right-sided weakness within an hour as code stroke.  She was noted to have a acute intracranial hemorrhage and was admitted to the hospital further care.  Assessment/Plan:  Active Problems:   ICH (intracerebral hemorrhage) (HCC)   Morbid obesity (HCC)   Chronic diastolic congestive heart failure (HCC)   Uncontrolled type 2 diabetes mellitus with hyperglycemia (HCC)   Essential hypertension   FUO (fever of unknown origin)   CKD (chronic kidney disease), stage III (HCC)   Left basal ganglia hemorrhagic CVA.  Likely hypertensive etiology.   CT head showed Left thalamic and basal ganglia intraparenchymal hemorrhage with local mass-effect and mild midline shift. MRI of the brain showed hematoma but no vascular lesion was notified.  MRA showed intracranial atherosclerosis.  Carotid duplex ultrasound with hemodynamically insignificant stenosis. 2D echocardiogram showed a preserved LV function of 55 to 60%.  Hemoglobin  A1c of 8.6.  Hold aspirin.  Neurology has seen during hospitalization and recommend outpatient follow-up appointment.  Physical therapy has seen the patient and recommended CIR   Acute respiratory failure with hypoxia and hypercapnia Improved secondary to stroke.  Patient was intubated in the ED on 12/06/2019.   Chest x-ray with clearing of opacities from the lungs.  Currently on room air.  Tracheobronchitis vs PNA Improved at this time.  Completed a 5-day course of Rocephin   Essential HTN On admission BP 171/157.  Continue hydralazine hydrochlorothiazide losartan and metoprolol.  BP has overall  improved.  Hyperlipidemia.  Resume statin at discharge  DM type II uncontrolled with Hyperglycemia Latest hemoglobin A1c of 8.6.  Patient is currently on Lantus 50 twice daily, NovoLog every 4 hourly and resistance scale insulin.  Latest POC glucose of 171.  Dysphagia secondary to stroke. On cortrak tube at this time.  Continue nighttime tube feeding.  Will discontinue cortrak tube by tomorrow if patient continues to improve with swallowing.  Patient was on dysphagia 1 diet and has been advanced to dysphagia 2 today with  more than 75% consumption.  Morbidly obese With benefit from weight loss as outpatient.  CKD stage IIIb (baseline Cr 1.2) continue to monitor.  History creatinine of 1.7.  We'll continue to monitor closely.  DVT prophylaxis: heparin injection 5,000 Units Start: 01/10/20 2200 SCD's Start: 01/04/20 1437   Code Status: DNR  Family Communication: Spoke with one of the daughters at bedside.   Status is: Inpatient  Remains inpatient appropriate because:IV treatments appropriate due to intensity of illness or inability to take PO, Inpatient level of care appropriate due to severity of illness and Need for CIR   Dispo: The patient is from: Home              Anticipated d/c is to: CIR              Anticipated d/c date is: 1 to 2 days              Patient currently is medically stable to d/c.  Consultants:  Neurology  PCCM  Procedures:  Cortrak tube tube placement,   Intubation and mechanical ventilation.  Antibiotics:  . None at this time  Anti-infectives (  From admission, onward)   Start     Dose/Rate Route Frequency Ordered Stop   01/11/20 1000  cefTRIAXone (ROCEPHIN) 2 g in sodium chloride 0.9 % 100 mL IVPB        2 g 200 mL/hr over 30 Minutes Intravenous Daily 01/10/20 1128 01/12/20 1313   01/08/20 0900  cefTRIAXone (ROCEPHIN) 2 g in sodium chloride 0.9 % 100 mL IVPB  Status:  Discontinued        2 g 200 mL/hr over 30 Minutes Intravenous Daily  01/08/20 0801 01/10/20 1128       Subjective: Today, patient was seen and examined at bedside.  Patient is more alert awake and communicative.  Denies any pain, nausea, vomiting.  Patient's daughter at bedside.  She stated that she was able to eat without any issue more than 75% of her breakfast. Objective: Vitals:   01/15/20 0528 01/15/20 0801  BP: (!) 135/45 (!) 143/54  Pulse:  72  Resp:  18  Temp:  98.1 F (36.7 C)  SpO2:  97%    Intake/Output Summary (Last 24 hours) at 01/15/2020 0848 Last data filed at 01/14/2020 2100 Gross per 24 hour  Intake 170 ml  Output --  Net 170 ml   Filed Weights   01/10/20 0500 01/14/20 0500 01/15/20 0500  Weight: 104 kg 104 kg 104.9 kg   Body mass index is 40.97 kg/m.   Physical Exam:  GENERAL: Alert awake and communicative.  Not in obvious distress, obese, on cortrak tube. HENT: No scleral pallor or icterus. Pupils equally reactive to light. Oral mucosa is moist NECK: is supple, no gross swelling noted. CHEST: Clear to auscultation. No crackles or wheezes.  Diminished breath sounds bilaterally. CVS: S1 and S2 heard, no murmur. Regular rate and rhythm.  ABDOMEN: Soft, non-tender, bowel sounds are present. EXTREMITIES: Right upper extremity flaccid weakness++.   CNS: Follows few commands, sitting up in chair, moves bilateral lower extremity, unable to move right upper extremity SKIN: warm and dry without rashes.  Data Review: I have personally reviewed the following laboratory data and studies,  CBC: Recent Labs  Lab 01/11/20 0109 01/12/20 0448 01/13/20 0420 01/14/20 0508 01/15/20 0355  WBC 11.2* 11.5* 9.7 10.0 9.8  NEUTROABS 7.8* 7.9* 6.5 6.3 6.5  HGB 13.4 13.5 14.2 13.2 12.7  HCT 42.6 41.9 44.5 41.3 39.9  MCV 93.2 92.5 94.5 91.8 92.8  PLT 221 251 225 256 241   Basic Metabolic Panel: Recent Labs  Lab 01/11/20 0109 01/12/20 0448 01/13/20 0420 01/14/20 0625 01/15/20 0355  NA 144 141 142 139 137  K 3.6 3.9 3.8 3.6 3.6   CL 101 101 100 101 98  CO2 34* 33* 30 26 27   GLUCOSE 130* 125* 178* 166* 179*  BUN 38* 45* 38* 42* 68*  CREATININE 1.17* 1.28* 1.27* 1.32* 1.74*  CALCIUM 8.5* 8.3* 8.5* 8.4* 8.2*  MG 2.2 2.4 2.2 2.4 2.5*  PHOS 3.5 4.0 3.7 3.9 5.0*   Liver Function Tests: Recent Labs  Lab 01/11/20 0109 01/12/20 0448 01/13/20 0420 01/14/20 0625 01/15/20 0355  AST 47* 34 37 41 44*  ALT 45* 37 36 39 41  ALKPHOS 54 54 52 55 51  BILITOT 0.5 0.4 0.5 0.7 0.4  PROT 6.1* 6.3* 6.2* 6.3* 6.0*  ALBUMIN 2.4* 2.5* 2.5* 2.7* 2.6*   No results for input(s): LIPASE, AMYLASE in the last 168 hours. Recent Labs  Lab 01/13/20 0716  AMMONIA 38*   Cardiac Enzymes: No results for input(s): CKTOTAL, CKMB, CKMBINDEX, TROPONINI  in the last 168 hours. BNP (last 3 results) No results for input(s): BNP in the last 8760 hours.  ProBNP (last 3 results) No results for input(s): PROBNP in the last 8760 hours.  CBG: Recent Labs  Lab 01/14/20 1732 01/14/20 2003 01/14/20 2346 01/15/20 0409 01/15/20 0805  GLUCAP 104* 102* 164* 186* 171*   Recent Results (from the past 240 hour(s))  Culture, blood (routine x 2)     Status: None   Collection Time: 01/07/20  9:33 AM   Specimen: BLOOD LEFT ARM  Result Value Ref Range Status   Specimen Description BLOOD LEFT ARM  Final   Special Requests   Final    BOTTLES DRAWN AEROBIC AND ANAEROBIC Blood Culture results may not be optimal due to an inadequate volume of blood received in culture bottles   Culture   Final    NO GROWTH 5 DAYS Performed at [facility], [address] [place], [place] [address]    Report Status 01/12/2020 FINAL  Final  Culture, blood (routine x 2)     Status: None   Collection Time: 01/07/20  9:35 AM   Specimen: BLOOD  Result Value Ref Range Status   Specimen Description BLOOD LEFT ANTECUBITAL  Final   Special Requests   Final    BOTTLES DRAWN AEROBIC AND ANAEROBIC Blood Culture adequate volume   Culture   Final    NO GROWTH 5  DAYS Performed at [facility], [address] [place], [place] [address]    Report Status 01/12/2020 FINAL  Final  Culture, respiratory (non-expectorated)     Status: None   Collection Time: 01/07/20 10:31 PM   Specimen: Tracheal Aspirate; Respiratory  Result Value Ref Range Status   Specimen Description TRACHEAL ASPIRATE  Final   Special Requests NONE  Final   Gram Stain   Final    RARE WBC PRESENT, PREDOMINANTLY PMN MODERATE SQUAMOUS EPITHELIAL CELLS PRESENT ABUNDANT GRAM POSITIVE COCCI ABUNDANT GRAM POSITIVE RODS RARE GRAM NEGATIVE RODS    Culture   Final    FEW Normal respiratory flora-no Staph aureus or Pseudomonas seen Performed at [facility], [address] [place], [place] [address]    Report Status 01/10/2020 FINAL  Final  Urine Culture     Status: Abnormal   Collection Time: 01/13/20  4:29 PM   Specimen: Urine, Random  Result Value Ref Range Status   Specimen Description URINE, RANDOM  Final   Special Requests   Final    NONE Performed at [facility], [address] [place], [place] [address]    Culture 40,000 COLONIES/mL ENTEROCOCCUS FAECIUM (A)  Final   Report Status 01/15/2020 FINAL  Final   Organism ID, Bacteria ENTEROCOCCUS FAECIUM (A)  Final      Susceptibility   Enterococcus faecium - MIC*    AMPICILLIN <=2 SENSITIVE Sensitive     NITROFURANTOIN 64 INTERMEDIATE Intermediate     VANCOMYCIN <=0.5 SENSITIVE Sensitive     * 40,000 COLONIES/mL ENTEROCOCCUS FAECIUM     Studies: EEG adult  Result Date: 01/13/2020 Charlsie QuestYadav, Priyanka O, MD     01/13/2020 10:56 AM Patient Name: [name] [name] MRN: [id number] Epilepsy Attending: Charlsie QuestPriyanka O Yadav Referring Physician/Provider: Dr. Milon DikesAshish Arora Date: 01/13/2020 Duration: 24.25 minutes Patient history: 86 year old female presented with aphasia and right-sided weakness.  EEG to evaluate for seizures. Level of alertness: Awake AEDs during EEG study: None Technical aspects: This EEG study was done  with scalp electrodes positioned  according to the 10-20 International system of electrode placement. Electrical activity was acquired at a sampling rate of 500Hz  and reviewed with a high frequency filter of 70Hz  and a low frequency filter of 1Hz . EEG data were recorded continuously and digitally stored. Description: The posterior dominant rhythm consists of 8 Hz activity of moderate voltage (25-35 uV) seen predominantly in posterior head regions, symmetric and reactive to eye opening and eye closing.  Hyperventilation and photic stimulation were not performed.   IMPRESSION: This study is within normal limits. No seizures or epileptiform discharges were seen throughout the recording. Priyanka , MD  [facility] 01/15/2020

## 2020-01-15 NOTE — Progress Notes (Signed)
Occupational Therapy Treatment Patient Details Name: [name] [name] MRN: [id number] DOB: [birth year] Today's Date: 01/15/2020    History of present illness The pt is an 86 yo female presenting with acute onset aphasia and R-sided weakness. Imaging revealed L basal ganglia/thalamic hemorrhage with 37mm midline shift and no intraventricular extension. The pt was intubated 11/7 - 11/11. PMH includes: DM II, HTN, CHF, and colon polyps. pt extubated 11/9.   OT comments  Excellent session today during co-treat with PT. Pt demonstrates motor impersistence, however is able to activate RLE during functional mobility. Pt completed sit - stand with facilitation for anterior weight shifts with mod A +2. Able to stand from Stedy paddles with min A. Beginning to develop flexor synergy RUE with increased activation of shoulder adduction. Facilitation used to focus on increasing core trunk control and midline orientation during ADL and mobility as noted below. Pt very motivated to improve. Daughter present during session and very supportive. Discussed importance of having her Mom attempt to participate in her ADL tasks. Excellent CIR candidate. Will continue to follow acutely.   Follow Up Recommendations  CIR;Supervision/Assistance - 24 hour    Equipment Recommendations  Other (comment) (tba)    Recommendations for Other Services Rehab consult    Precautions / Restrictions Precautions Precautions: Fall Precaution Comments: R-sided hemiparesis, coretrack       Mobility Bed Mobility Overal bed mobility: Needs Assistance Bed Mobility: Rolling;Sidelying to Sit Rolling: Mod assist Sidelying to sit: Mod assist;HOB elevated       General bed mobility comments: Facilitation of rolling; Assist to progress RLE off bed, however pt with active activation; Assist to push trunk upright however pt pushing up through LUE  Transfers Overall transfer level: Needs assistance   Transfers: Sit to/from Stand Sit  to Stand: Mod assist;+2 physical assistance (from bed)         General transfer comment: Pt's arms over therapist's shoulder - facilitated anterior weight shift then to stand, blocking R knee. R bias, however able to activate lateral weight shift, bumping each therapist's hip. Requires increased time to process, however good follow through;Difficulty with keeping RLE "turned on"due to apparent motor impersistence    Balance     Sitting balance-Leahy Scale: Fair Sitting balance - Comments: Balance affected by inattention; when distracted, pt with R bias     Standing balance-Leahy Scale: Poor                             ADL either performed or assessed with clinical judgement   ADL Overall ADL's : Needs assistance/impaired Eating/Feeding: Minimal assistance Eating/Feeding Details (indicate cue type and reason): Educated on need to let her Mom self feed and to not attempt to give her anything when she is not alert. Educated how self feeding can increase the patient's level of arousal and decrease the risk of aspiration Grooming: Wash/dry hands;Wash/dry face;Supervision/safety;Set up   Upper Body Bathing: Moderate assistance;Sitting   Lower Body Bathing: Maximal assistance;Bed level   Upper Body Dressing : Moderate assistance;Sitting   Lower Body Dressing: Total assistance;Bed level               Functional mobility during ADLs: Moderate assistance;+2 for physical assistance (sit 0 stand; Stedy)       Vision   Vision Assessment?: Vision impaired- to be further tested in functional context Additional Comments: visual inattention; will further assess   Perception     Praxis  Cognition Arousal/Alertness: Lethargic Behavior During Therapy: WFL for tasks assessed/performed Overall Cognitive Status: Impaired/Different from baseline Area of Impairment: Attention;Memory;Safety/judgement;Awareness;Problem solving                   Current Attention  Level: Sustained Memory: Decreased short-term memory (Did not remember using Stedy which has beenused multipletime)   Safety/Judgement: Decreased awareness of safety;Decreased awareness of deficits Awareness: Emergent Problem Solving: Decreased initiation;Slow processing;Requires verbal cues;Requires tactile cues          Exercises     Shoulder Instructions       General Comments Once in stedy, pt able to stand from stedy paddles with min A. Able to maintain upright midline posture with Min A; heavily dependent on use of L hand to hold stedy bar; With repetition, performance improved. Once in chair, pt demonstrates activation of R shoulder in synergy pattern with movement into adduction. No movement out of synergy noted. Daughter present for session and educated regarding proper positioning for RUE to decrease dependent edeMA    Pertinent Vitals/ Pain       Pain Assessment: Faces Faces Pain Scale: No hurt  Home Living                                          Prior Functioning/Environment              Frequency  Min 2X/week        Progress Toward Goals  OT Goals(current goals can now be found in the care plan section)  Progress towards OT goals: Progressing toward goals  Acute Rehab OT Goals Patient Stated Goal: Be home for Christmas OT Goal Formulation: With patient/family Time For Goal Achievement: 01/19/20 Potential to Achieve Goals: Good ADL Goals Pt Will Perform Grooming: sitting;with min assist Pt/caregiver will Perform Home Exercise Program: Increased ROM;Increased strength;Right Upper extremity;With minimal assist;With written HEP provided Additional ADL Goal #1: Pt will maintain static balance EOB with minA as precursor to EOB/OOB ADL. Additional ADL Goal #2: Pt will follow multi-step commands with 50% accuracy and no more than min cues. Additional ADL Goal #3: Pt will perform bed mobility with modA as precursor to EOB/OOB ADL Additional  ADL Goal #4: Pt will be able to find all items asked of her on tray table without VCs  Plan Discharge plan remains appropriate    Co-evaluation    PT/OT/SLP Co-Evaluation/Treatment: Yes Reason for Co-Treatment: For patient/therapist safety;To address functional/ADL transfers   OT goals addressed during session: ADL's and self-care;Strengthening/ROM      AM-PAC OT "6 Clicks" Daily Activity     Outcome Measure   Help from another person eating meals?: A Lot Help from another person taking care of personal grooming?: A Lot Help from another person toileting, which includes using toliet, bedpan, or urinal?: Total Help from another person bathing (including washing, rinsing, drying)?: A Lot Help from another person to put on and taking off regular upper body clothing?: A Lot Help from another person to put on and taking off regular lower body clothing?: Total 6 Click Score: 10    End of Session Equipment Utilized During Treatment: Gait belt  OT Visit Diagnosis: Unsteadiness on feet (R26.81);Other abnormalities of gait and mobility (R26.89);Muscle weakness (generalized) (M62.81);Other symptoms and signs involving cognitive function;Other symptoms and signs involving the nervous system (R29.898);Hemiplegia and hemiparesis Hemiplegia - Right/Left: Right Hemiplegia - dominant/non-dominant: Dominant Hemiplegia -  caused by: Other Nontraumatic intracranial hemorrhage   Activity Tolerance Patient tolerated treatment well   Patient Left in chair;with call bell/phone within reach;with chair alarm set;with family/visitor present   Nurse Communication Mobility status;Need for lift equipment        Time: 1309-1340 OT Time Calculation (min): 31 min  Charges: OT General Charges $OT Visit: 1 Visit OT Treatments $Neuromuscular Re-education: 8-22 mins  Luisa Dago, OT/L   Acute OT Clinical Specialist Acute Rehabilitation Services Pager [phone] Office  [phone]    [facility] 01/15/2020, 2:32 PM

## 2020-01-15 NOTE — H&P (Incomplete)
Physical Medicine and Rehabilitation Admission H&P     HPI: [name] [name] is a 86 year old right-handed female with history of pancreatitis, obesity with BMI 41.32, diastolic congestive heart failure, diabetes mellitus and hypertension.  History taken from chart review and son due to aphasia.  Patient lives with son and daughter-in-law who is a retired Engineer, civil (consulting).  Family plans to hire caregiver support as needed.  Presented 01/04/2020 with right side weakness and aphasia while in church.  Admission chemistries glucose 139 BUN 31 creatinine 1.39 hemoglobin 15.4 hemoglobin A1c 8.6.  Cranial CT scan showed a 1.3 x 2.7 x 1.5 cm acute parenchymal hemorrhage centered within the left thalamus and basal ganglia.  Local mass-effect with partial effacement of the third ventricle and 2 mm rightward midline shift at the level of the septum pellucidum.  Known chronic pontine lacunar infarct.  Patient did not receive TPA.  Placed on Cleviprex for blood pressure control.  Required intubation initially for airway protection and extubated 01/06/2020 followed by pulmonary services felt to be high risk for reintubation.  MRI/MRA revealed no visible mass or vascular lesion underlying the left thalamic hematoma.  Extensive chronic small vessel ischemia.  No major branch occlusion or limiting stenosis.  EEG negative for seizure.  Echocardiogram with ejection fraction of 55 to 60% no wall motion abnormalities grade 2 diastolic dysfunction.  Hospital course complicated by dysphagia currently on a dysphagia #1 thin liquid diet.  Patient was cleared to begin subcutaneous heparin for DVT prophylaxis 01/10/2020.  Therapy evaluations completed and patient was admitted for a comprehensive rehab program.  Review of Systems  Unable to perform ROS: Acuity of condition   Past Medical History:  Diagnosis Date  . Colon polyp   . Diabetes mellitus 1992  . Hypertension 1996  . Pancreatitis    Past Surgical History:  Procedure  Laterality Date  . CHOLECYSTECTOMY    . COLONOSCOPY    . ERCP    . PARTIAL HYSTERECTOMY  86years old  . POLYPECTOMY    . UPPER GASTROINTESTINAL ENDOSCOPY    . WRIST FRACTURE SURGERY  left arm,86years old   No family history on file. Social History:  reports that she has never smoked. She has never used smokeless tobacco. She reports that she does not drink alcohol and does not use drugs. Allergies:  Allergies  Allergen Reactions  . Amlodipine Other (See Comments)    EDEMA  . Ace Inhibitors Cough  . Canagliflozin     INVOKANA  . Morphine And Related Rash and Other (See Comments)    For long periods of time (??)   Medications Prior to Admission  Medication Sig Dispense Refill  . acetaminophen (TYLENOL) 500 MG tablet Take 500 mg by mouth every 6 (six) hours as needed for mild pain.    Marland Kitchen albuterol (PROVENTIL HFA;VENTOLIN HFA) 108 (90 BASE) MCG/ACT inhaler Inhale 2 puffs into the lungs every 6 (six) hours as needed for wheezing or shortness of breath.    Marland Kitchen aspirin 81 MG chewable tablet Chew 81 mg by mouth daily.     Marland Kitchen azelastine (ASTELIN) 0.1 % nasal spray Place 1 spray into both nostrils 2 (two) times daily. Use in each nostril as directed    . CALCIUM CARBONATE-VITAMIN D PO Take 1 tablet by mouth 2 (two) times daily.    . chlorthalidone (HYGROTON) 50 MG tablet Take 50 mg by mouth daily.    . cloNIDine (CATAPRES) 0.2 MG tablet Take 0.2-0.3 mg by mouth See admin instructions. 0.2mg   in am, 0.3mg  in pm    . diclofenac sodium (VOLTAREN) 1 % GEL Apply 4 g topically 4 (four) times daily. (Patient taking differently: Apply 4 g topically 4 (four) times daily as needed (knee pain). ) 100 g 0  . furosemide (LASIX) 20 MG tablet Take 20 mg by mouth See admin instructions. Take 1 tablet (20mg ) by mouth all day except on Sunday    . insulin NPH-regular Human (NOVOLIN 70/30) (70-30) 100 UNIT/ML injection Inject 60-100 Units into the skin See admin instructions. Check blood sugar in inject insulin as  directed: Give 100 units in AM before breakfast, 60-80 units at lunch, and 70 units at dinner    . loratadine (CLARITIN) 10 MG tablet Take 10 mg by mouth daily.    04-17-1969 losartan (COZAAR) 100 MG tablet Take 100 mg by mouth daily.    . metoprolol (TOPROL-XL) 200 MG 24 hr tablet Take 200 mg by mouth daily.    . Potassium Chloride ER 20 MEQ TBCR Take 20 mEq by mouth daily.     . pravastatin (PRAVACHOL) 20 MG tablet Take 20 mg by mouth daily.     . meclizine (ANTIVERT) 12.5 MG tablet Take 1 tablet (12.5 mg total) by mouth 3 (three) times daily as needed for dizziness. (Patient not taking: Reported on 06/11/2019) 30 tablet 0  . ondansetron (ZOFRAN ODT) 4 MG disintegrating tablet Take 1 tablet (4 mg total) by mouth every 8 (eight) hours as needed for nausea or vomiting. (Patient not taking: Reported on 01/05/2020) 20 tablet 0    Drug Regimen Review Drug regimen was reviewed and remains appropriate with no significant issues identified  Home: Home Living Family/patient expects to be discharged to:: Private residence Living Arrangements: Alone Available Help at Discharge: Family Type of Home: House Home Access: Ramped entrance Home Layout: One level Bathroom Shower/Tub: [phone]: Handicapped height Bathroom Accessibility: Yes Additional Comments: Pt unable to provide home information at this time, no family present. Will continue to assess.  Lives With: Alone   Functional History: Prior Function Level of Independence: Independent Comments: per RN, family has told her the pt was "in good health"  Functional Status:  Mobility: Bed Mobility Overal bed mobility: Needs Assistance Bed Mobility: Rolling, Sidelying to Sit Rolling: Max assist Sidelying to sit: Max assist Supine to sit: +2 for physical assistance, +2 for safety/equipment, Total assist Sit to supine: +2 for physical assistance, +2 for safety/equipment, Total assist Sit to sidelying: Total assist, +2 for physical  assistance General bed mobility comments: able to grasp rail with L hand and hold pt in R SL after max A to roll to L. Max A to roll to R. Max A for LE's off bed but pt able to initiate push away from bed with LUE to elevate trunk into sitting. Max A given at shoulders Transfers Overall transfer level: Needs assistance Equipment used: Ambulation equipment used Transfer via Lift Equipment: Stedy Transfers: Sit to/from Stand, Health visitor Sit to Stand: Mod assist, +2 safety/equipment, From elevated surface Stand pivot transfers: Total assist, +2 safety/equipment General transfer comment: mod A to R side for support, pt able to push through LLE and pull on stedy bar with LUE. anterior and superior force given at chest and back for upward motion. +2 for safety Ambulation/Gait General Gait Details: unable to step feet    ADL: ADL Overall ADL's : Needs assistance/impaired Grooming: Wash/dry face, Set up, Supervision/safety, Sitting Grooming Details (indicate cue type and reason): pt able  to wash face with LUE from bed level Lower Body Dressing: Total assistance, Bed level Lower Body Dressing Details (indicate cue type and reason): to don socks Toilet Transfer: Moderate assistance, +2 for physical assistance Toilet Transfer Details (indicate cue type and reason): simulated via functional mobility with stedy- MODA  +2 to stand to stedy Functional mobility during ADLs: Moderate assistance, +2 for physical assistance, Maximal assistance (MAX A +2 bed mobility; MOD A +2 sit<>stand) General ADL Comments: pt able to progress functional mobility this session with pt able to complete x4 sit<>stands with MIN- MOD A +2. pt continues to present with R sided weakness  and decreased activity tolerance however pt continues to be an excellent CIR candidate d/t pts strong family support and continued progression wtih therapies  Cognition: Cognition Overall Cognitive Status: Difficult to assess  Arousal/Alertness: Lethargic Orientation Level: Oriented X4 Attention: Sustained Sustained Attention: Impaired Sustained Attention Impairment: Functional basic Problem Solving: Impaired Problem Solving Impairment: Functional basic Cognition Arousal/Alertness: Lethargic Behavior During Therapy: WFL for tasks assessed/performed Overall Cognitive Status: Difficult to assess Area of Impairment: Orientation, Problem solving Orientation Level: Disoriented to, Time, Situation Following Commands: Follows one step commands with increased time (with vision) Awareness: Intellectual Problem Solving: Decreased initiation, Difficulty sequencing, Requires verbal cues, Requires tactile cues General Comments: pt asking about when dinner is coming and where her daughter is, both appropriate questions at the time Difficult to assess due to: Level of arousal  Physical Exam: Blood pressure (!) 135/45, pulse 69, temperature 97.7 F (36.5 C), temperature source Oral, resp. rate 18, height  (1.6 m), weight 104.9 kg, SpO2 94 %. Physical Exam Neurological:     Comments: Patient is alert in no acute distress.  Expressive receptive aphasia.  Follows some simple one-step commands.     Results for orders placed or performed during the hospital encounter of 01/04/20 (from the past 48 hour(s))  Blood gas, arterial     Status: Abnormal   Collection Time: 01/13/20  6:10 AM  Result Value Ref Range   FIO2 21.00    pH, Arterial 7.473 (H) 7.35 - 7.45   pCO2 arterial 42.8 32 - 48 mmHg   pO2, Arterial 68.5 (L) 83 - 108 mmHg   Bicarbonate 31.0 (H) 20.0 - 28.0 mmol/L   Acid-Base Excess 7.1 (H) 0.0 - 2.0 mmol/L   O2 Saturation 93.3 %   Patient temperature 37.0    Collection site RIGHT RADIAL    Drawn by [id number]     Comment: COLLECTED BY RT   Sample type ARTERIAL DRAW    Allens test (pass/fail) PASS PASS    Comment: Performed at [facility], [address], [place], [place] [address]  Ammonia      Status: Abnormal   Collection Time: 01/13/20  7:16 AM  Result Value Ref Range   Ammonia 38 (H) 9 - 35 umol/L    Comment: Performed at [facility], [address], [place], [place] [address]  Glucose, capillary     Status: Abnormal   Collection Time: 01/13/20  7:34 AM  Result Value Ref Range   Glucose-Capillary 125 (H) 70 - 99 mg/dL    Comment: Glucose reference range applies only to samples taken after fasting for at least 8 hours.  Glucose, capillary     Status: Abnormal   Collection Time: 01/13/20 11:50 AM  Result Value Ref Range   Glucose-Capillary 161 (H) 70 - 99 mg/dL    Comment: Glucose reference range applies only to  samples taken after fasting for at least 8 hours.  Glucose, capillary     Status: Abnormal   Collection Time: 01/13/20  3:38 PM  Result Value Ref Range   Glucose-Capillary 180 (H) 70 - 99 mg/dL    Comment: Glucose reference range applies only to samples taken after fasting for at least 8 hours.  Urine Culture     Status: Abnormal (Preliminary result)   Collection Time: 01/13/20  4:29 PM   Specimen: Urine, Random  Result Value Ref Range   Specimen Description URINE, RANDOM    Special Requests NONE    Culture (A)     40,000 COLONIES/mL ENTEROCOCCUS FAECIUM SUSCEPTIBILITIES TO FOLLOW Performed at [facility], [address], [place], [place] [address]    Report Status PENDING   Urinalysis, Routine w reflex microscopic Urine, Random     Status: Abnormal   Collection Time: 01/13/20  4:34 PM  Result Value Ref Range   Color, Urine YELLOW YELLOW   APPearance CLEAR CLEAR   Specific Gravity, Urine 1.015 1.005 - 1.030   pH 8.0 5.0 - 8.0   Glucose, UA NEGATIVE NEGATIVE mg/dL   Hgb urine dipstick NEGATIVE NEGATIVE   Bilirubin Urine NEGATIVE NEGATIVE   Ketones, ur NEGATIVE NEGATIVE mg/dL   Protein, ur 657 (A) NEGATIVE mg/dL   Nitrite NEGATIVE NEGATIVE   Leukocytes,Ua NEGATIVE NEGATIVE   RBC / HPF 0-5 0 - 5 RBC/hpf   WBC, UA 0-5 0 - 5 WBC/hpf    Bacteria, UA NONE SEEN NONE SEEN   Squamous Epithelial / LPF 0-5 0 - 5    Comment: Performed at [facility], [address] [place], [place] [address]  Glucose, capillary     Status: Abnormal   Collection Time: 01/13/20  7:59 PM  Result Value Ref Range   Glucose-Capillary 190 (H) 70 - 99 mg/dL    Comment: Glucose reference range applies only to samples taken after fasting for at least 8 hours.   Comment 1 Notify RN    Comment 2 Document in Chart   Glucose, capillary     Status: Abnormal   Collection Time: 01/13/20 11:19 PM  Result Value Ref Range   Glucose-Capillary 151 (H) 70 - 99 mg/dL    Comment: Glucose reference range applies only to samples taken after fasting for at least 8 hours.   Comment 1 Notify RN    Comment 2 Document in Chart   Glucose, capillary     Status: Abnormal   Collection Time: 01/14/20  3:20 AM  Result Value Ref Range   Glucose-Capillary 118 (H) 70 - 99 mg/dL    Comment: Glucose reference range applies only to samples taken after fasting for at least 8 hours.   Comment 1 Notify RN    Comment 2 Document in Chart   CBC with Differential/Platelet     Status: Abnormal   Collection Time: 01/14/20  5:08 AM  Result Value Ref Range   WBC 10.0 4.0 - 10.5 K/uL   RBC 4.50 3.87 - 5.11 MIL/uL   Hemoglobin 13.2 12.0 - 15.0 g/dL   HCT 29.5 36 - 46 %   MCV 91.8 80.0 - 100.0 fL   MCH 29.3 26.0 - 34.0 pg   MCHC 32.0 30.0 - 36.0 g/dL   RDW 28.4 13.2 - 44.0 %   Platelets 256 150 - 400 K/uL   nRBC 0.3 (H) 0.0 - 0.2 %   Neutrophils Relative % 62 %   Neutro Abs 6.3  1.7 - 7.7 K/uL   Lymphocytes Relative 20 %   Lymphs Abs 2.0 0.7 - 4.0 K/uL   Monocytes Relative 11 %   Monocytes Absolute 1.1 (H) 0.1 - 1.0 K/uL   Eosinophils Relative 3 %   Eosinophils Absolute 0.3 0.0 - 0.5 K/uL   Basophils Relative 1 %   Basophils Absolute 0.1 0.0 - 0.1 K/uL   Immature Granulocytes 3 %   Abs Immature Granulocytes 0.25 (H) 0.00 - 0.07 K/uL    Comment: Performed at [facility], [address] [place], [place] [address]  Comprehensive metabolic panel     Status: Abnormal   Collection Time: 01/14/20  6:25 AM  Result Value Ref Range   Sodium 139 135 - 145 mmol/L   Potassium 3.6 3.5 - 5.1 mmol/L   Chloride 101 98 - 111 mmol/L   CO2 26 22 - 32 mmol/L   Glucose, Bld 166 (H) 70 - 99 mg/dL    Comment: Glucose reference range applies only to samples taken after fasting for at least 8 hours.   BUN 42 (H) 8 - 23 mg/dL   Creatinine, Ser 6.041.32 (H) 0.44 - 1.00 mg/dL   Calcium 8.4 (L) 8.9 - 10.3 mg/dL   Total Protein 6.3 (L) 6.5 - 8.1 g/dL   Albumin 2.7 (L) 3.5 - 5.0 g/dL   AST 41 15 - 41 U/L   ALT 39 0 - 44 U/L   Alkaline Phosphatase 55 38 - 126 U/L   Total Bilirubin 0.7 0.3 - 1.2 mg/dL   GFR, Estimated 40 (L) >60 mL/min    Comment: (NOTE) Calculated using the CKD-EPI Creatinine Equation (2021)    Anion gap 12 5 - 15    Comment: Performed at [facility], [address] [place], [place] [address]  Magnesium     Status: None   Collection Time: 01/14/20  6:25 AM  Result Value Ref Range   Magnesium 2.4 1.7 - 2.4 mg/dL    Comment: Performed at [facility], [address] [place], [place] [address]  Phosphorus     Status: None   Collection Time: 01/14/20  6:25 AM  Result Value Ref Range   Phosphorus 3.9 2.5 - 4.6 mg/dL    Comment: Performed at [facility], [address] [place], [place] [address]  Glucose, capillary     Status: Abnormal   Collection Time: 01/14/20  7:52 AM  Result Value Ref Range   Glucose-Capillary 157 (H) 70 - 99 mg/dL    Comment: Glucose reference range applies only to samples taken after fasting for at least 8 hours.  Glucose, capillary     Status: Abnormal   Collection Time: 01/14/20 11:43 AM  Result Value Ref Range   Glucose-Capillary 187 (H) 70 - 99 mg/dL    Comment: Glucose reference range applies only to samples taken after fasting for at least 8 hours.  Glucose, capillary     Status: Abnormal    Collection Time: 01/14/20  3:58 PM  Result Value Ref Range   Glucose-Capillary 147 (H) 70 - 99 mg/dL    Comment: Glucose reference range applies only to samples taken after fasting for at least 8 hours.  Glucose, capillary     Status: Abnormal   Collection Time: 01/14/20  5:32 PM  Result Value Ref Range   Glucose-Capillary 104 (H) 70 - 99 mg/dL    Comment: Glucose reference range applies only to samples taken after fasting for at  least 8 hours.  Glucose, capillary     Status: Abnormal   Collection Time: 01/14/20  8:03 PM  Result Value Ref Range   Glucose-Capillary 102 (H) 70 - 99 mg/dL    Comment: Glucose reference range applies only to samples taken after fasting for at least 8 hours.  Glucose, capillary     Status: Abnormal   Collection Time: 01/14/20 11:46 PM  Result Value Ref Range   Glucose-Capillary 164 (H) 70 - 99 mg/dL    Comment: Glucose reference range applies only to samples taken after fasting for at least 8 hours.  Comprehensive metabolic panel     Status: Abnormal   Collection Time: 01/15/20  3:55 AM  Result Value Ref Range   Sodium 137 135 - 145 mmol/L   Potassium 3.6 3.5 - 5.1 mmol/L   Chloride 98 98 - 111 mmol/L   CO2 27 22 - 32 mmol/L   Glucose, Bld 179 (H) 70 - 99 mg/dL    Comment: Glucose reference range applies only to samples taken after fasting for at least 8 hours.   BUN 68 (H) 8 - 23 mg/dL   Creatinine, Ser 0.97 (H) 0.44 - 1.00 mg/dL   Calcium 8.2 (L) 8.9 - 10.3 mg/dL   Total Protein 6.0 (L) 6.5 - 8.1 g/dL   Albumin 2.6 (L) 3.5 - 5.0 g/dL   AST 44 (H) 15 - 41 U/L   ALT 41 0 - 44 U/L   Alkaline Phosphatase 51 38 - 126 U/L   Total Bilirubin 0.4 0.3 - 1.2 mg/dL   GFR, Estimated 28 (L) >60 mL/min    Comment: (NOTE) Calculated using the CKD-EPI Creatinine Equation (2021)    Anion gap 12 5 - 15    Comment: Performed at [facility], [address] [place], [place] [address]  Magnesium     Status: Abnormal   Collection Time: 01/15/20  3:55 AM   Result Value Ref Range   Magnesium 2.5 (H) 1.7 - 2.4 mg/dL    Comment: Performed at [facility], [address] [place], [place] [address]  Phosphorus     Status: Abnormal   Collection Time: 01/15/20  3:55 AM  Result Value Ref Range   Phosphorus 5.0 (H) 2.5 - 4.6 mg/dL    Comment: Performed at [facility], [address], [place], [place] [address]  Glucose, capillary     Status: Abnormal   Collection Time: 01/15/20  4:09 AM  Result Value Ref Range   Glucose-Capillary 186 (H) 70 - 99 mg/dL    Comment: Glucose reference range applies only to samples taken after fasting for at least 8 hours.   DG Abd 1 View  Result Date: 01/13/2020 CLINICAL DATA:  Abdominal tenderness. EXAM: ABDOMEN - 1 VIEW COMPARISON:  CT 06/11/2019. FINDINGS: Surgical clips right upper quadrant. Feeding tube noted with tip over the distal stomach. No bowel distention. Barium noted throughout the colon. No free air. Degenerative change lumbar spine. IMPRESSION: Feeding tube noted with tip over the distal stomach. No bowel distention. Barium noted throughout the colon. Electronically Signed   By: Maisie Fus  Register   On: 01/13/2020 06:37   DG CHEST PORT 1 VIEW  Result Date: 01/13/2020 CLINICAL DATA:  Altered mental status. EXAM: PORTABLE CHEST 1 VIEW COMPARISON:  Chest x-ray 01/08/2020. FINDINGS: Feeding tube noted with tip below left hemidiaphragm. Heart size normal. Low lung volumes with basilar atelectasis. Interim near complete clearing of left base infiltrate. No pleural effusion  or pneumothorax. Barium noted the colon. IMPRESSION: 1. Feeding tube noted with tip below left hemidiaphragm. 2. Low lung volumes with basilar atelectasis. Interim near complete clearing of left base infiltrate. Electronically Signed   By: Maisie Fus  Register   On: 01/13/2020 07:30   EEG adult  Result Date: 01/13/2020 Charlsie Quest, MD     01/13/2020 10:56 AM Patient Name: PANSY OSTROVSKY MRN: [id number] Epilepsy Attending:  Charlsie Quest Referring Physician/Provider: Dr. Milon Dikes Date: 01/13/2020 Duration: 24.25 minutes Patient history: 86 year old female presented with aphasia and right-sided weakness.  EEG to evaluate for seizures. Level of alertness: Awake AEDs during EEG study: None Technical aspects: This EEG study was done with scalp electrodes positioned according to the 10-20 International system of electrode placement. Electrical activity was acquired at a sampling rate of  and reviewed with a high frequency filter of  and a low frequency filter of . EEG data were recorded continuously and digitally stored. Description: The posterior dominant rhythm consists of 8 Hz activity of moderate voltage (25-35 uV) seen predominantly in posterior head regions, symmetric and reactive to eye opening and eye closing.  Hyperventilation and photic stimulation were not performed.   IMPRESSION: This study is within normal limits. No seizures or epileptiform discharges were seen throughout the recording. Charlsie Quest       Medical Problem List and Plan: 1.  Right side weakness and aphasia secondary to acute parenchymal hemorrhage centered within the left thalamus and basal ganglia with mass-effect likely secondary to hypertension  -patient may *** shower  -ELOS/Goals: *** 2.  Antithrombotics: -DVT/anticoagulation: Subcutaneous heparin  -antiplatelet therapy: N/A 3. Pain Management: Tylenol as needed 4. Mood: Vital motional support  -antipsychotic agents: N/A 5. Neuropsych: This patient is not capable of making decisions on her own behalf. 6. Skin/Wound Care: Routine skin checks 7. Fluids/Electrolytes/Nutrition: Routine in and outs with follow-up chemistries 8.  Dysphagia.  Dysphagia #1 thin liquids.  Tube feeds for nutritional support. 9.  Diabetes mellitus.  Hemoglobin A1c 8.6.  NovoLog 8 units every 4 hours, Lantus insulin 50 units twice daily 9.  Hypertension.  Cozaar 50 mg twice daily, Lopressor  100 mg twice daily, hydralazine 100 mg every 8 hours, HCTZ 25 mg daily,   Monitor with increased mobility 10.  Diastolic congestive heart failure.  Lasix 20 mg daily.  Monitor for any signs of fluid overload 11.  Morbid obesity.  BMI 40.97.  Dietary follow-up 12.  Constipation.  MiraLAX daily, Senokot S1 tablet twice daily.  ***  Mcarthur Rossetti Akaisha Truman, PA-C 01/15/2020

## 2020-01-15 NOTE — Progress Notes (Signed)
Physical Therapy Treatment Patient Details Name: [name] [name] MRN: [id number] DOB: September 15, 1932 Today's Date: 01/15/2020    History of Present Illness The pt is an 86 yo female presenting with acute onset aphasia and R-sided weakness. Imaging revealed L basal ganglia/thalamic hemorrhage with 46mm midline shift and no intraventricular extension. The pt was intubated 11/7 - 11/11. PMH includes: DM II, HTN, CHF, and colon polyps. pt extubated 11/9.    PT Comments    Treated pt in conjunction with OT to maximize safety and quality of session. Pt demonstrates motor impersistence, however is able to activate R leg during functional mobility and when provided increased time and repeated cues. Educated pt and pt's daughter on necessity to perform LAQ and seated marching throughout day to increase bilat leg strength and muscle activation. Pt completed sit to stand transfers with cues for anterior weight shifts with mod A +2 from EOB while providing pt with R knee block and cues to maintain midline posture as she tends to lean to the R. Able to stand from Stedy paddles with min A. Facilitation used to focus on increasing core trunk control and midline orientation while sitting and during extended standing bouts. Pt very motivated to improve. Daughter present during session and very supportive. Pt would benefit greatly from intensive therapies in the CIR setting. Will continue to follow acutely.    Follow Up Recommendations  CIR;Supervision/Assistance - 24 hour     Equipment Recommendations  Other (comment) (defer to next venue)    Recommendations for Other Services Rehab consult     Precautions / Restrictions Precautions Precautions: Fall Precaution Comments: R-sided hemiparesis, coretrack Restrictions Weight Bearing Restrictions: No    Mobility  Bed Mobility Overal bed mobility: Needs Assistance Bed Mobility: Rolling;Sidelying to Sit Rolling: Mod assist Sidelying to sit: Mod assist;HOB  elevated       General bed mobility comments: Facilitation of rolling with feet placed on bed surface and cues to protract R shoulder; Assist to progress RLE off bed, however pt with active activation; Assist to push trunk upright however pt pushing up through LUE  Transfers Overall transfer level: Needs assistance   Transfers: Sit to/from Stand Sit to Stand: Mod assist;+2 physical assistance (from bed)         General transfer comment: Pt's arms over therapist's shoulder - facilitated anterior weight shift then to stand, blocking R knee. R bias, however able to activate lateral weight shift, bumping each therapist's hip. Requires increased time to process, however good follow through;Difficulty with keeping RLE "turned on". MinAx2 to come to stand from elevated surface of stedy compared to modAx2 required from lower surface of bed.  Ambulation/Gait             General Gait Details: unable to step feet   Stairs             Wheelchair Mobility    Modified Rankin (Stroke Patients Only) Modified Rankin (Stroke Patients Only) Pre-Morbid Rankin Score: No symptoms Modified Rankin: Severe disability     Balance Overall balance assessment: Needs assistance Sitting-balance support: Feet supported (intermittent UE support) Sitting balance-Leahy Scale: Fair Sitting balance - Comments: Balance affected by inattention; when distracted, pt with R bias Postural control: Right lateral lean Standing balance support: Bilateral upper extremity supported Standing balance-Leahy Scale: Poor Standing balance comment: Standing x3 bouts of ~2 min > ~1 min > ~30 sec. Pt leans to R. Thus, required cues to shift weight to L along with R knee block and hand-over-hand positioning  of R hand on stedy for safety. Intermittent moments of minA-min guard assist.                            Cognition Arousal/Alertness: Lethargic Behavior During Therapy: WFL for tasks  assessed/performed Overall Cognitive Status: Impaired/Different from baseline Area of Impairment: Attention;Memory;Safety/judgement;Awareness;Problem solving;Following commands                   Current Attention Level: Sustained Memory: Decreased short-term memory (Did not remember using Stedy which has beenused multipletime) Following Commands: Follows one step commands consistently;Follows one step commands with increased time Safety/Judgement: Decreased awareness of safety;Decreased awareness of deficits Awareness: Emergent Problem Solving: Decreased initiation;Slow processing;Requires verbal cues;Requires tactile cues General Comments: Pt did not recall utilizing the stedy previous sessions. She required cues to maintain attention to particular limbs during tasks or to her body position to maintain balance. Extra time required to respond to cues. Pt unaware of leaning laterally to R until cued to correct.      Exercises General Exercises - Lower Extremity Long Arc Quad: Strengthening;Right;Other reps (comment);AAROM;Seated (8 reps) Hip Flexion/Marching: AROM;Strengthening;Both;10 reps;Other reps (comment);Seated (5 reps R, 10 reps L)    General Comments General comments (skin integrity, edema, etc.): Educated pt and pt's daughter on performing seated LAQ and marching while in chair to continue to improve bilat leg strength and muscle activation in R leg      Pertinent Vitals/Pain Pain Assessment: Faces Faces Pain Scale: No hurt Pain Intervention(s): Limited activity within patient's tolerance;Monitored during session;Repositioned    Home Living                      Prior Function            PT Goals (current goals can now be found in the care plan section) Acute Rehab PT Goals Patient Stated Goal: to get up to chair PT Goal Formulation: With patient/family Time For Goal Achievement: 01/19/20 Potential to Achieve Goals: Good Progress towards PT goals:  Progressing toward goals    Frequency    Min 4X/week      PT Plan Current plan remains appropriate    Co-evaluation PT/OT/SLP Co-Evaluation/Treatment: Yes Reason for Co-Treatment: For patient/therapist safety;To address functional/ADL transfers PT goals addressed during session: Mobility/safety with mobility;Balance;Strengthening/ROM OT goals addressed during session: ADL's and self-care;Strengthening/ROM      AM-PAC PT "6 Clicks" Mobility   Outcome Measure  Help needed turning from your back to your side while in a flat bed without using bedrails?: A Lot Help needed moving from lying on your back to sitting on the side of a flat bed without using bedrails?: A Lot Help needed moving to and from a bed to a chair (including a wheelchair)?: Total Help needed standing up from a chair using your arms (e.g., wheelchair or bedside chair)?: A Lot Help needed to walk in hospital room?: Total Help needed climbing 3-5 steps with a railing? : Total 6 Click Score: 9    End of Session Equipment Utilized During Treatment: Gait belt Activity Tolerance: Patient tolerated treatment well Patient left: in chair;with call bell/phone within reach;with chair alarm set;with family/visitor present (daughter present)   PT Visit Diagnosis: Other abnormalities of gait and mobility (R26.89);Muscle weakness (generalized) (M62.81);Hemiplegia and hemiparesis;Unsteadiness on feet (R26.81);Difficulty in walking, not elsewhere classified (R26.2);Other symptoms and signs involving the nervous system (R29.898) Hemiplegia - Right/Left: Right Hemiplegia - dominant/non-dominant: Dominant Hemiplegia - caused by: Other  cerebrovascular disease     Time: 2878-6767 PT Time Calculation (min) (ACUTE ONLY): 34 min  Charges:  $Therapeutic Activity: 8-22 mins                     Raymond Gurney, PT, DPT Acute Rehabilitation Services  Pager: [phone] Office: [phone]    Jewel Baize 01/15/2020, 2:52  PM

## 2020-01-15 NOTE — Progress Notes (Signed)
  Speech Language Pathology Treatment: Dysphagia;Cognitive-Linquistic  Patient Details Name: [name] [name] MRN: [id number] DOB: Jan 24, 1933 Today's Date: 01/15/2020 Time: 1015-1040 SLP Time Calculation (min) (ACUTE ONLY): 25 min  Assessment / Plan / Recommendation Clinical Impression  Pt was seen for dysphagia and cognitive-linguistic treatment with daughter present. Her arousal was much improved as she was alert and talkative during the session. She was observed with thin liquid and soft solids and tolerated all POs well without any overt s/sx of aspiration. She continues to demonstrate min anterior spillage and oral residue. However, she independently uses a lingual sweep to address any residue. Recommend pt's diet be upgraded to dys 2/thin. SLP will f/u for diet toleration and advancement.  Cognitive-linguistic treatment targeted the pt's verbal expression. Pt was very communicative and engaged in conversation with the therapist and her daughter with min word-finding difficulties. Min verbal cues were successful in addressing word-finding impairment. Her intelligibility at the phrase level is reduced to about 75%. However, when asked to restate something she said, she independently uses compensatory strategies like slow rate and overarticulation. SLP will continue to f/u to address cognitive-linguistic goals.  HPI HPI: The pt is an 86 yo female presenting with acute onset aphasia and R-sided weakness. Imaging revealed L basal ganglia/thalamic hemorrhage with 30mm midline shift and no intraventricular extension. The pt was intubated 11/7-11/9. PMH includes: DM II, HTN, CHF, and colon polyps.       SLP Plan  Continue with current plan of care       Recommendations  Diet recommendations: Dysphagia 2 (fine chop);Thin liquid Liquids provided via: Cup;Straw Medication Administration: Crushed with puree Supervision: Full supervision/cueing for compensatory strategies Compensations: Slow  rate;Small sips/bites;Monitor for anterior loss;Lingual sweep for clearance of pocketing Postural Changes and/or Swallow Maneuvers: Seated upright 90 degrees                Oral Care Recommendations: Oral care BID Follow up Recommendations: Inpatient Rehab SLP Visit Diagnosis: Dysphagia, oropharyngeal phase (R13.12) Plan: Continue with current plan of care       GO                [name] [name] 01/15/2020, 11:24 AM

## 2020-01-16 ENCOUNTER — Encounter (HOSPITAL_COMMUNITY): Payer: Self-pay | Admitting: Physical Medicine & Rehabilitation

## 2020-01-16 ENCOUNTER — Inpatient Hospital Stay (HOSPITAL_COMMUNITY)
Admission: RE | Admit: 2020-01-16 | Discharge: 2020-02-12 | DRG: 057 | Disposition: A | Discharge status: Home / Self Care | Payer: Medicare Other | Source: Intra-hospital | Attending: Physical Medicine & Rehabilitation | Admitting: Physical Medicine & Rehabilitation

## 2020-01-16 ENCOUNTER — Other Ambulatory Visit: Payer: Self-pay

## 2020-01-16 DIAGNOSIS — I69119 Unspecified symptoms and signs involving cognitive functions following nontraumatic intracerebral hemorrhage: Secondary | ICD-10-CM | POA: Diagnosis not present

## 2020-01-16 DIAGNOSIS — I10 Essential (primary) hypertension: Secondary | ICD-10-CM | POA: Diagnosis not present

## 2020-01-16 DIAGNOSIS — Z885 Allergy status to narcotic agent status: Secondary | ICD-10-CM | POA: Diagnosis not present

## 2020-01-16 DIAGNOSIS — I69391 Dysphagia following cerebral infarction: Secondary | ICD-10-CM

## 2020-01-16 DIAGNOSIS — M1711 Unilateral primary osteoarthritis, right knee: Secondary | ICD-10-CM | POA: Diagnosis present

## 2020-01-16 DIAGNOSIS — D696 Thrombocytopenia, unspecified: Secondary | ICD-10-CM

## 2020-01-16 DIAGNOSIS — R0989 Other specified symptoms and signs involving the circulatory and respiratory systems: Secondary | ICD-10-CM

## 2020-01-16 DIAGNOSIS — Z20822 Contact with and (suspected) exposure to covid-19: Secondary | ICD-10-CM | POA: Diagnosis present

## 2020-01-16 DIAGNOSIS — E11649 Type 2 diabetes mellitus with hypoglycemia without coma: Secondary | ICD-10-CM | POA: Diagnosis not present

## 2020-01-16 DIAGNOSIS — R7309 Other abnormal glucose: Secondary | ICD-10-CM

## 2020-01-16 DIAGNOSIS — I509 Heart failure, unspecified: Secondary | ICD-10-CM | POA: Diagnosis present

## 2020-01-16 DIAGNOSIS — Z794 Long term (current) use of insulin: Secondary | ICD-10-CM

## 2020-01-16 DIAGNOSIS — E162 Hypoglycemia, unspecified: Secondary | ICD-10-CM

## 2020-01-16 DIAGNOSIS — I69192 Facial weakness following nontraumatic intracerebral hemorrhage: Secondary | ICD-10-CM

## 2020-01-16 DIAGNOSIS — D72829 Elevated white blood cell count, unspecified: Secondary | ICD-10-CM

## 2020-01-16 DIAGNOSIS — Z888 Allergy status to other drugs, medicaments and biological substances status: Secondary | ICD-10-CM | POA: Diagnosis not present

## 2020-01-16 DIAGNOSIS — Z90711 Acquired absence of uterus with remaining cervical stump: Secondary | ICD-10-CM

## 2020-01-16 DIAGNOSIS — I5032 Chronic diastolic (congestive) heart failure: Secondary | ICD-10-CM | POA: Diagnosis not present

## 2020-01-16 DIAGNOSIS — G479 Sleep disorder, unspecified: Secondary | ICD-10-CM | POA: Diagnosis not present

## 2020-01-16 DIAGNOSIS — Z86010 Personal history of colonic polyps: Secondary | ICD-10-CM

## 2020-01-16 DIAGNOSIS — Z6841 Body Mass Index (BMI) 40.0 and over, adult: Secondary | ICD-10-CM | POA: Diagnosis not present

## 2020-01-16 DIAGNOSIS — E1122 Type 2 diabetes mellitus with diabetic chronic kidney disease: Secondary | ICD-10-CM | POA: Diagnosis present

## 2020-01-16 DIAGNOSIS — I69151 Hemiplegia and hemiparesis following nontraumatic intracerebral hemorrhage affecting right dominant side: Principal | ICD-10-CM

## 2020-01-16 DIAGNOSIS — N1831 Chronic kidney disease, stage 3a: Secondary | ICD-10-CM | POA: Diagnosis not present

## 2020-01-16 DIAGNOSIS — I69120 Aphasia following nontraumatic intracerebral hemorrhage: Secondary | ICD-10-CM | POA: Diagnosis not present

## 2020-01-16 DIAGNOSIS — I610 Nontraumatic intracerebral hemorrhage in hemisphere, subcortical: Secondary | ICD-10-CM | POA: Diagnosis not present

## 2020-01-16 DIAGNOSIS — Z79899 Other long term (current) drug therapy: Secondary | ICD-10-CM | POA: Diagnosis not present

## 2020-01-16 DIAGNOSIS — N189 Chronic kidney disease, unspecified: Secondary | ICD-10-CM

## 2020-01-16 DIAGNOSIS — N179 Acute kidney failure, unspecified: Secondary | ICD-10-CM | POA: Diagnosis present

## 2020-01-16 DIAGNOSIS — I611 Nontraumatic intracerebral hemorrhage in hemisphere, cortical: Secondary | ICD-10-CM | POA: Diagnosis not present

## 2020-01-16 DIAGNOSIS — E875 Hyperkalemia: Secondary | ICD-10-CM

## 2020-01-16 DIAGNOSIS — K5901 Slow transit constipation: Secondary | ICD-10-CM | POA: Diagnosis not present

## 2020-01-16 DIAGNOSIS — M545 Low back pain, unspecified: Secondary | ICD-10-CM | POA: Diagnosis present

## 2020-01-16 DIAGNOSIS — I69191 Dysphagia following nontraumatic intracerebral hemorrhage: Secondary | ICD-10-CM

## 2020-01-16 DIAGNOSIS — I130 Hypertensive heart and chronic kidney disease with heart failure and stage 1 through stage 4 chronic kidney disease, or unspecified chronic kidney disease: Secondary | ICD-10-CM | POA: Diagnosis present

## 2020-01-16 DIAGNOSIS — I619 Nontraumatic intracerebral hemorrhage, unspecified: Secondary | ICD-10-CM

## 2020-01-16 DIAGNOSIS — E1165 Type 2 diabetes mellitus with hyperglycemia: Secondary | ICD-10-CM | POA: Diagnosis present

## 2020-01-16 DIAGNOSIS — R5383 Other fatigue: Secondary | ICD-10-CM

## 2020-01-16 DIAGNOSIS — R1310 Dysphagia, unspecified: Secondary | ICD-10-CM

## 2020-01-16 DIAGNOSIS — Z7982 Long term (current) use of aspirin: Secondary | ICD-10-CM

## 2020-01-16 DIAGNOSIS — I69190 Apraxia following nontraumatic intracerebral hemorrhage: Secondary | ICD-10-CM

## 2020-01-16 DIAGNOSIS — R1312 Dysphagia, oropharyngeal phase: Secondary | ICD-10-CM | POA: Diagnosis present

## 2020-01-16 DIAGNOSIS — Z9049 Acquired absence of other specified parts of digestive tract: Secondary | ICD-10-CM | POA: Diagnosis not present

## 2020-01-16 DIAGNOSIS — F411 Generalized anxiety disorder: Secondary | ICD-10-CM | POA: Diagnosis not present

## 2020-01-16 DIAGNOSIS — I639 Cerebral infarction, unspecified: Secondary | ICD-10-CM | POA: Diagnosis not present

## 2020-01-16 DIAGNOSIS — E876 Hypokalemia: Secondary | ICD-10-CM | POA: Diagnosis present

## 2020-01-16 LAB — COMPREHENSIVE METABOLIC PANEL
ALT: 43 U/L (ref 0–44)
AST: 40 U/L (ref 15–41)
Albumin: 2.9 g/dL — ABNORMAL LOW (ref 3.5–5.0)
Alkaline Phosphatase: 57 U/L (ref 38–126)
Anion gap: 11 (ref 5–15)
BUN: 73 mg/dL — ABNORMAL HIGH (ref 8–23)
CO2: 26 mmol/L (ref 22–32)
Calcium: 8.5 mg/dL — ABNORMAL LOW (ref 8.9–10.3)
Chloride: 102 mmol/L (ref 98–111)
Creatinine, Ser: 1.61 mg/dL — ABNORMAL HIGH (ref 0.44–1.00)
GFR, Estimated: 31 mL/min — ABNORMAL LOW (ref >60)
Glucose, Bld: 104 mg/dL — ABNORMAL HIGH (ref 70–99)
Potassium: 3.8 mmol/L (ref 3.5–5.1)
Sodium: 139 mmol/L (ref 135–145)
Total Bilirubin: 0.5 mg/dL (ref 0.3–1.2)
Total Protein: 6.5 g/dL (ref 6.5–8.1)

## 2020-01-16 LAB — CBC WITH DIFFERENTIAL/PLATELET
Abs Immature Granulocytes: 0.32 10*3/uL — ABNORMAL HIGH (ref 0.00–0.07)
Basophils Absolute: 0.1 10*3/uL (ref 0.0–0.1)
Basophils Relative: 1 %
Eosinophils Absolute: 0.3 10*3/uL (ref 0.0–0.5)
Eosinophils Relative: 3 %
HCT: 40.1 % (ref 36.0–46.0)
Hemoglobin: 13.1 g/dL (ref 12.0–15.0)
Immature Granulocytes: 3 %
Lymphocytes Relative: 18 %
Lymphs Abs: 2.4 10*3/uL (ref 0.7–4.0)
MCH: 29.8 pg (ref 26.0–34.0)
MCHC: 32.7 g/dL (ref 30.0–36.0)
MCV: 91.1 fL (ref 80.0–100.0)
Monocytes Absolute: 1.2 10*3/uL — ABNORMAL HIGH (ref 0.1–1.0)
Monocytes Relative: 10 %
Neutro Abs: 8.5 10*3/uL — ABNORMAL HIGH (ref 1.7–7.7)
Neutrophils Relative %: 65 %
Platelets: 281 10*3/uL (ref 150–400)
RBC: 4.4 MIL/uL (ref 3.87–5.11)
RDW: 14.6 % (ref 11.5–15.5)
WBC: 12.9 10*3/uL — ABNORMAL HIGH (ref 4.0–10.5)
nRBC: 0 % (ref 0.0–0.2)

## 2020-01-16 LAB — GLUCOSE, CAPILLARY
Glucose-Capillary: 112 mg/dL — ABNORMAL HIGH (ref 70–99)
Glucose-Capillary: 120 mg/dL — ABNORMAL HIGH (ref 70–99)
Glucose-Capillary: 127 mg/dL — ABNORMAL HIGH (ref 70–99)
Glucose-Capillary: 132 mg/dL — ABNORMAL HIGH (ref 70–99)
Glucose-Capillary: 159 mg/dL — ABNORMAL HIGH (ref 70–99)
Glucose-Capillary: 75 mg/dL (ref 70–99)
Glucose-Capillary: 83 mg/dL (ref 70–99)

## 2020-01-16 MED ORDER — HEPARIN SODIUM (PORCINE) 5000 UNIT/ML IJ SOLN
5000.0000 [IU] | Freq: Three times a day (TID) | INTRAMUSCULAR | Status: DC
  Administered 2020-01-16 – 2020-02-12 (×81): 5000 [IU] via SUBCUTANEOUS
  Filled 2020-01-16 (×80): qty 1

## 2020-01-16 MED ORDER — ALUM & MAG HYDROXIDE-SIMETH 200-200-20 MG/5ML PO SUSP: 30.0000 mL | ORAL | Status: DC | PRN

## 2020-01-16 MED ORDER — HYDRALAZINE HCL 50 MG PO TABS
100.0000 mg | ORAL_TABLET | Freq: Three times a day (TID) | ORAL | Status: DC
  Administered 2020-01-16 – 2020-02-12 (×77): 100 mg via ORAL
  Filled 2020-01-16 (×84): qty 2

## 2020-01-16 MED ORDER — FUROSEMIDE 20 MG PO TABS
20.0000 mg | ORAL_TABLET | Freq: Every day | ORAL | Status: DC
  Administered 2020-01-17 – 2020-01-18 (×2): 20 mg via ORAL
  Filled 2020-01-16 (×2): qty 1

## 2020-01-16 MED ORDER — PANTOPRAZOLE SODIUM 40 MG PO PACK: 40.0000 mg | PACK | Freq: Every day | ORAL | Status: DC

## 2020-01-16 MED ORDER — SENNOSIDES-DOCUSATE SODIUM 8.6-50 MG PO TABS: 1.0000 | ORAL_TABLET | Freq: Two times a day (BID) | ORAL | Status: DC

## 2020-01-16 MED ORDER — ACETAMINOPHEN 325 MG PO TABS: 325.0000 mg | ORAL_TABLET | ORAL | Status: DC | PRN

## 2020-01-16 MED ORDER — POLYETHYLENE GLYCOL 3350 17 G PO PACK: 17.0000 g | PACK | Freq: Every day | ORAL | Status: DC | PRN

## 2020-01-16 MED ORDER — PROCHLORPERAZINE EDISYLATE 10 MG/2ML IJ SOLN: 5.0000 mg | Freq: Four times a day (QID) | INTRAMUSCULAR | Status: DC | PRN

## 2020-01-16 MED ORDER — ALBUTEROL SULFATE (2.5 MG/3ML) 0.083% IN NEBU: 2.5000 mg | INHALATION_SOLUTION | Freq: Four times a day (QID) | RESPIRATORY_TRACT | Status: DC | PRN

## 2020-01-16 MED ORDER — PROCHLORPERAZINE MALEATE 5 MG PO TABS: 5.0000 mg | ORAL_TABLET | Freq: Four times a day (QID) | ORAL | Status: DC | PRN

## 2020-01-16 MED ORDER — ORAL CARE MOUTH RINSE
15.0000 mL | Freq: Two times a day (BID) | OROMUCOSAL | Status: DC
  Administered 2020-01-17 – 2020-02-04 (×30): 15 mL via OROMUCOSAL

## 2020-01-16 MED ORDER — CHLORHEXIDINE GLUCONATE 0.12 % MT SOLN
15.0000 mL | Freq: Two times a day (BID) | OROMUCOSAL | Status: DC
  Administered 2020-01-16 – 2020-02-06 (×41): 15 mL via OROMUCOSAL
  Filled 2020-01-16 (×42): qty 15

## 2020-01-16 MED ORDER — PANTOPRAZOLE SODIUM 40 MG PO TBEC: 40.0000 mg | DELAYED_RELEASE_TABLET | Freq: Every day | ORAL | Status: DC

## 2020-01-16 MED ORDER — INSULIN ASPART 100 UNIT/ML ~~LOC~~ SOLN
0.0000 [IU] | Freq: Three times a day (TID) | SUBCUTANEOUS | Status: DC
  Administered 2020-01-16 – 2020-01-17 (×3): 3 [IU] via SUBCUTANEOUS
  Administered 2020-01-17: 4 [IU] via SUBCUTANEOUS
  Administered 2020-01-18: 3 [IU] via SUBCUTANEOUS
  Administered 2020-01-18: 7 [IU] via SUBCUTANEOUS

## 2020-01-16 MED ORDER — INSULIN ASPART 100 UNIT/ML ~~LOC~~ SOLN
0.0000 [IU] | Freq: Three times a day (TID) | SUBCUTANEOUS | Status: DC
Start: 2020-01-16 — End: 2020-02-12

## 2020-01-16 MED ORDER — HYDRALAZINE HCL 50 MG PO TABS: 100.0000 mg | ORAL_TABLET | Freq: Three times a day (TID) | ORAL | Status: DC

## 2020-01-16 MED ORDER — HYDRALAZINE HCL 100 MG PO TABS
100.0000 mg | ORAL_TABLET | Freq: Three times a day (TID) | ORAL | Status: DC
Start: 2020-01-16 — End: 2020-02-10

## 2020-01-16 MED ORDER — PROCHLORPERAZINE 25 MG RE SUPP: 12.5000 mg | Freq: Four times a day (QID) | RECTAL | Status: DC | PRN

## 2020-01-16 MED ORDER — HYDROCHLOROTHIAZIDE 25 MG PO TABS
25.0000 mg | ORAL_TABLET | Freq: Every day | ORAL | Status: DC
  Administered 2020-01-17 – 2020-01-21 (×5): 25 mg via ORAL
  Filled 2020-01-16 (×5): qty 1

## 2020-01-16 MED ORDER — SENNOSIDES-DOCUSATE SODIUM 8.6-50 MG PO TABS
1.0000 | ORAL_TABLET | Freq: Two times a day (BID) | ORAL | Status: DC
  Administered 2020-01-16 – 2020-02-06 (×35): 1 via ORAL
  Filled 2020-01-16 (×43): qty 1

## 2020-01-16 MED ORDER — BLOOD PRESSURE CONTROL BOOK
Freq: Once | Status: DC
  Filled 2020-01-16: qty 1

## 2020-01-16 MED ORDER — HYDROCHLOROTHIAZIDE 25 MG PO TABS: 25.0000 mg | ORAL_TABLET | Freq: Every day | ORAL | Status: DC

## 2020-01-16 MED ORDER — GUAIFENESIN-DM 100-10 MG/5ML PO SYRP: 5.0000 mL | ORAL_SOLUTION | Freq: Four times a day (QID) | ORAL | Status: DC | PRN

## 2020-01-16 MED ORDER — FUROSEMIDE 20 MG PO TABS: 20.0000 mg | ORAL_TABLET | Freq: Every day | ORAL | Status: DC

## 2020-01-16 MED ORDER — HYDROCHLOROTHIAZIDE 25 MG PO TABS
25.0000 mg | ORAL_TABLET | Freq: Every day | ORAL | Status: DC
Start: 2020-01-17 — End: 2020-02-12

## 2020-01-16 MED ORDER — POLYETHYLENE GLYCOL 3350 17 G PO PACK: 17.0000 g | PACK | Freq: Every day | ORAL | 0 refills | Status: DC

## 2020-01-16 MED ORDER — METOPROLOL TARTRATE 50 MG PO TABS: 100.0000 mg | ORAL_TABLET | Freq: Two times a day (BID) | ORAL | Status: DC

## 2020-01-16 MED ORDER — FLEET ENEMA 7-19 GM/118ML RE ENEM: 1.0000 | ENEMA | Freq: Once | RECTAL | Status: DC | PRN

## 2020-01-16 MED ORDER — DIPHENHYDRAMINE HCL 12.5 MG/5ML PO ELIX: 12.5000 mg | ORAL_SOLUTION | Freq: Four times a day (QID) | ORAL | Status: DC | PRN

## 2020-01-16 MED ORDER — SENNOSIDES-DOCUSATE SODIUM 8.6-50 MG PO TABS
1.0000 | ORAL_TABLET | Freq: Two times a day (BID) | ORAL | Status: DC
Start: 2020-01-16 — End: 2020-02-10

## 2020-01-16 MED ORDER — DIPHENHYDRAMINE HCL 12.5 MG/5ML PO ELIX
12.5000 mg | ORAL_SOLUTION | Freq: Four times a day (QID) | ORAL | Status: DC | PRN
  Administered 2020-01-20: 25 mg via ORAL
  Filled 2020-01-16: qty 10

## 2020-01-16 MED ORDER — PANTOPRAZOLE SODIUM 40 MG PO PACK
40.0000 mg | PACK | Freq: Every day | ORAL | Status: DC
  Administered 2020-01-17 – 2020-02-06 (×21): 40 mg via ORAL
  Filled 2020-01-16 (×9): qty 20

## 2020-01-16 MED ORDER — TRAZODONE HCL 50 MG PO TABS
25.0000 mg | ORAL_TABLET | Freq: Every evening | ORAL | Status: DC | PRN
  Administered 2020-01-19: 25 mg via ORAL
  Administered 2020-01-25 – 2020-01-26 (×2): 50 mg via ORAL
  Filled 2020-01-16 (×3): qty 1

## 2020-01-16 MED ORDER — LIDOCAINE HCL URETHRAL/MUCOSAL 2 % EX GEL: CUTANEOUS | Status: DC | PRN

## 2020-01-16 MED ORDER — ACETAMINOPHEN 325 MG PO TABS
325.0000 mg | ORAL_TABLET | ORAL | Status: DC | PRN
  Administered 2020-01-17 – 2020-02-11 (×34): 650 mg via ORAL
  Filled 2020-01-16 (×37): qty 2

## 2020-01-16 MED ORDER — INSULIN ASPART 100 UNIT/ML ~~LOC~~ SOLN: 8.0000 [IU] | Freq: Three times a day (TID) | SUBCUTANEOUS | Status: DC

## 2020-01-16 MED ORDER — TRAZODONE HCL 50 MG PO TABS: 25.0000 mg | ORAL_TABLET | Freq: Every evening | ORAL | Status: DC | PRN

## 2020-01-16 MED ORDER — INSULIN GLARGINE 100 UNIT/ML ~~LOC~~ SOLN
50.0000 [IU] | Freq: Two times a day (BID) | SUBCUTANEOUS | Status: DC
  Administered 2020-01-16 – 2020-01-18 (×4): 50 [IU] via SUBCUTANEOUS
  Filled 2020-01-16 (×5): qty 0.5

## 2020-01-16 MED ORDER — POLYETHYLENE GLYCOL 3350 17 G PO PACK
17.0000 g | PACK | Freq: Every day | ORAL | Status: DC | PRN
  Administered 2020-01-24: 17 g via ORAL
  Filled 2020-01-16: qty 1

## 2020-01-16 MED ORDER — METOPROLOL TARTRATE 50 MG PO TABS
100.0000 mg | ORAL_TABLET | Freq: Two times a day (BID) | ORAL | Status: DC
  Administered 2020-01-16 – 2020-02-12 (×51): 100 mg via ORAL
  Filled 2020-01-16 (×54): qty 2

## 2020-01-16 MED ORDER — LIVING BETTER WITH HEART FAILURE BOOK: Freq: Once | Status: DC

## 2020-01-16 MED ORDER — BISACODYL 10 MG RE SUPP
10.0000 mg | Freq: Every day | RECTAL | Status: DC | PRN
  Administered 2020-01-25: 10 mg via RECTAL
  Filled 2020-01-16: qty 1

## 2020-01-16 MED ORDER — INSULIN GLARGINE 100 UNIT/ML ~~LOC~~ SOLN: 50.0000 [IU] | Freq: Two times a day (BID) | SUBCUTANEOUS | Status: DC

## 2020-01-16 MED ORDER — METOPROLOL TARTRATE 100 MG PO TABS
100.0000 mg | ORAL_TABLET | Freq: Two times a day (BID) | ORAL | Status: DC
Start: 2020-01-16 — End: 2020-02-10

## 2020-01-16 MED ORDER — ENSURE ENLIVE PO LIQD
237.0000 mL | Freq: Two times a day (BID) | ORAL | Status: DC
  Administered 2020-01-17 – 2020-01-19 (×5): 237 mL via ORAL

## 2020-01-16 NOTE — Discharge Summary (Signed)
Physician Discharge Summary  [name] [name] ZOX:[id number] DOB: 11-30-1932 DOA: 01/04/2020  PCP: [name], MD  Admit date: 01/04/2020 Discharge date: 01/16/2020  Admitted From: Home  Discharge disposition: CIR   Recommendations for Outpatient Follow-Up:   . Follow up with your primary care provider and/or neurology after discharge from acute rehabilitation. . Check CBC, BMP, magnesium in 2 to 3 days. . Aspirin is still on hold.  Neurology recommended restarting aspirin once hematoma resolution,  might need repeat CT scanning for this. . Patient did have a cortrak tube for feeding which has been removed at this time due to improvement in overall pain.  Advance diet as tolerated, will likely need speech for follow-up.   Discharge Diagnosis:   Active Problems:   ICH (intracerebral hemorrhage) (HCC)   Morbid obesity (HCC)   Chronic diastolic congestive heart failure (HCC)   Uncontrolled type 2 diabetes mellitus with hyperglycemia (HCC)   Essential hypertension   FUO (fever of unknown origin)   CKD (chronic kidney disease), stage III (HCC)   Discharge Condition: Improved.  Diet recommendation: Low sodium, heart healthy.  Carbohydrate-modified.  Dysphagia 2 diet  Wound care: None.  Code status: DNR   History of Present Illness:  Patient is 86 years old female with past medical history of DM type II uncontrolled with  retinopathy, Chronic diastolic CHF, HTN, HLD, CKD stage IIIb, pancreatitis, Hx colonic polyps presented to the hospital with acute onset of aphasia and right-sided weakness within an hour and  code stroke was activated.  She was noted to have a acute intracranial hemorrhage and was admitted to the hospital further care.  Hospital Course:   Following conditions were addressed during hospitalization as listed below,  Left basal ganglia hemorrhagic CVA.  Likely hypertensive etiology.   CT head showed Left thalamic and basal ganglia intraparenchymal  hemorrhage with local mass-effect and mild midline shift. MRI of the brain showed hematoma but no vascular lesion was notified.  MRA showed intracranial atherosclerosis.  Carotid duplex ultrasound with hemodynamically insignificant stenosis. 2D echocardiogram showed a preserved LV function of 55 to 60%.  Hemoglobin  A1c of 8.6.    Aspirin is still on hold.  To start aspirin when resolution of hematoma.  Might need a CT scan as outpatient.  Neurology has seen during hospitalization and recommend outpatient follow-up appointment.  Physical therapy has seen the patient and recommended CIR   Acute respiratory failure with hypoxia and hypercapnia Improved, likely secondary to stroke.  Patient was intubated in the ED on 12/06/2019.   Chest x-ray with clearing of opacities from the lungs.  Currently on room air.  Tracheobronchitis vs PNA Improved at this time.  Completed a 5-day course of Rocephin   Essential HTN On admission BP 171/157.  Continue hydralazine, hydrochlorothiazide, losartan and metoprolol.  Patient was on clonidine at home which has been discontinued.  Metoprolol long-acting has been changed to short acting in the hospital. BP has overall improved.  Hyperlipidemia.  Resume statin at discharge  DM type II uncontrolled with Hyperglycemia Latest hemoglobin A1c of 8.6.  Patient is currently on Lantus 50 units twice daily, NovoLog every 4 hourly and resistance scale insulin.  Latest POC glucose of 127.  We will keep this regimen for now.  Patient was on different regimen of insulin at home.  Dysphagia secondary to stroke. On cortrak tube at this time.  Will discontinue to facilitate oral feeding.  She has overall improved intake.  Speech and swallow has seen the patient.  Currently on a dysphagia 2 diet..  Morbidly obese  With benefit from weight loss as outpatient.  CKD stage IIIb (baseline Cr 1.2) continue to monitor.  creatinine of 1.6.  Monitor BMP  periodically.  Disposition.  At this time, patient is stable for disposition to CIR.  Spoke with the patient's granddaughter at bedside.  Medical Consultants:    Neurology  PMR  PCCM  Procedures:     Cortrak tube tube placement,   intubation and mechanical ventilation. Subjective:   Today, patient was seen and examined at bedside.  Was in the process of eating.  Patient's granddaughter at bedside.  No interval complaints reported.  Patient denies any headache, nausea, vomiting  Discharge Exam:   Vitals:   01/16/20 0403 01/16/20 0844  BP: (!) 152/57 (!) 150/61  Pulse: 69 71  Resp: 18 18  Temp: 97.9 F (36.6 C) 98.4 F (36.9 C)  SpO2: 96% 95%   Vitals:   01/16/20 0006 01/16/20 0403 01/16/20 0500 01/16/20 0844  BP: (!) 145/59 (!) 152/57  (!) 150/61  Pulse: 65 69  71  Resp: 19 18  18   Temp: 98.1 F (36.7 C) 97.9 F (36.6 C)  98.4 F (36.9 C)  TempSrc: Oral Oral  Oral  SpO2: 94% 96%  95%  Weight:   105.3 kg   Height:       General: Alert awake, not in obvious distress, obese, communicative, cortrak tube in place. HENT: pupils equally reacting to light,  No scleral pallor or icterus noted. Oral mucosa is moist.  Chest:  Clear breath sounds.  Diminished breath sounds bilaterally. No crackles or wheezes.  CVS: S1 &S2 heard. No murmur.  Regular rate and rhythm. Abdomen: Soft, nontender, nondistended.  Bowel sounds are heard.   Extremities: No cyanosis, clubbing or edema.  Peripheral pulses are palpable. Psych: Alert, awake and oriented, normal mood CNS:  No cranial nerve deficits.  Right upper extremity weakness noted.  Moves bilateral lower extremities. Skin: Warm and dry.  No rashes noted.  The results of significant diagnostics from this hospitalization (including imaging, microbiology, ancillary and laboratory) are listed below for reference.     Diagnostic Studies:   DG Chest 1 View  Result Date: 01/04/2020 CLINICAL DATA:  86 year old female status post  intubation. EXAM: CHEST  1 VIEW COMPARISON:  Chest x-ray 03/05/2013. FINDINGS: An endotracheal tube is in place with tip 4.2 cm above the carina. A nasogastric tube is seen extending into the stomach, however, the tip of the nasogastric tube extends below the lower margin of the image. Lung volumes are slightly low. There is cephalization of the pulmonary vasculature and slight indistinctness of the interstitial markings suggestive of mild pulmonary edema. No pleural effusions. No pneumothorax. Mild cardiomegaly. The patient is rotated to the right on today's exam, resulting in distortion of the mediastinal contours and reduced diagnostic sensitivity and specificity for mediastinal pathology. Aortic atherosclerosis. IMPRESSION: 1. Support apparatus, as above. 2. Low lung volumes with evidence of mild congestive heart failure, as above. 3. Aortic atherosclerosis. Electronically Signed   By: 05/03/2013 M.D.   On: 01/04/2020 14:33   CT HEAD WO CONTRAST  Result Date: 01/04/2020 CLINICAL DATA:  Hypertensive intracranial hemorrhage. EXAM: CT HEAD WITHOUT CONTRAST TECHNIQUE: Contiguous axial images were obtained from the base of the skull through the vertex without intravenous contrast. COMPARISON:  CT head 01/04/2020, approximately 1 hour prior to the current study. FINDINGS: Brain: Acute hemorrhage left thalamus unchanged in size. This measures approximately 13 x  25 mm on axial images. No intraventricular hemorrhage. Generalized atrophy. Moderate white matter changes consistent with chronic microvascular ischemia. No acute ischemic infarct. Right frontal dural base calcification unchanged likely meningioma measuring approximately 8 mm Vascular: Negative for hyperdense vessel Skull: Negative Sinuses/Orbits: Mild mucosal edema maxillary sinus bilaterally. Bilateral cataract extraction Other: None IMPRESSION: Left thalamic hemorrhage unchanged from earlier today. No new hemorrhage or hydrocephalus. Moderate  chronic microvascular ischemic change in the white matter. Electronically Signed   By: Marlan Palauharles  Clark M.D.   On: 01/04/2020 15:20   MR ANGIO HEAD WO CONTRAST  Result Date: 01/05/2020 CLINICAL DATA:  Stroke follow-up EXAM: MRI HEAD WITHOUT AND WITH CONTRAST MRA HEAD WITHOUT CONTRAST TECHNIQUE: Multiplanar, multiecho pulse sequences of the brain and surrounding structures were obtained without and with intravenous contrast. Angiographic images of the head were obtained using MRA technique without contrast. CONTRAST:  10mL GADAVIST GADOBUTROL 1 MMOL/ML IV SOLN COMPARISON:  Head CT from yesterday FINDINGS: MRI HEAD FINDINGS Brain: Acute hematoma in the left thalamus, T2 isointense and T1 hyperintense. Adjacent diffusion signal attributed to susceptibility artifact. Dimensions are similar to prior head CT, 20 x 10 mm on axial slices. There is a thin rim of adjacent edema. Hemorrhage in this location is often hypertensive and there is confluent chronic small vessel ischemia in the deep cerebral white matter. No underlying masslike enhancement or abnormal regional vessels on postcontrast imaging. No hydrocephalus or extra-axial collection. Nodular structure along the inner table of the right far lateral frontal bone is nonenhancing the attributed to ossification. Vascular: See below. Deep venous structures are symmetrically enhancing Skull and upper cervical spine: Normal marrow signal. Sinuses/Orbits: Unremarkable MRA HEAD FINDINGS Mild atheromatous type irregularity to the carotid and vertebral arteries. No major branch occlusion, beading, or proximal flow limiting stenosis. There is mild to moderate narrowing of the right anterior temporal branch and right proximal PCA. No flow related signal at the thalamic hematoma. IMPRESSION: 1. No visible mass or vascular lesion underlying the left thalamic hematoma. 2. Extensive chronic small vessel ischemia. 3. Intracranial atherosclerosis. Electronically Signed   By:  Marnee SpringJonathon  Watts M.D.   On: 01/05/2020 06:24   MR BRAIN W WO CONTRAST  Result Date: 01/05/2020 CLINICAL DATA:  Stroke follow-up EXAM: MRI HEAD WITHOUT AND WITH CONTRAST MRA HEAD WITHOUT CONTRAST TECHNIQUE: Multiplanar, multiecho pulse sequences of the brain and surrounding structures were obtained without and with intravenous contrast. Angiographic images of the head were obtained using MRA technique without contrast. CONTRAST:  10mL GADAVIST GADOBUTROL 1 MMOL/ML IV SOLN COMPARISON:  Head CT from yesterday FINDINGS: MRI HEAD FINDINGS Brain: Acute hematoma in the left thalamus, T2 isointense and T1 hyperintense. Adjacent diffusion signal attributed to susceptibility artifact. Dimensions are similar to prior head CT, 20 x 10 mm on axial slices. There is a thin rim of adjacent edema. Hemorrhage in this location is often hypertensive and there is confluent chronic small vessel ischemia in the deep cerebral white matter. No underlying masslike enhancement or abnormal regional vessels on postcontrast imaging. No hydrocephalus or extra-axial collection. Nodular structure along the inner table of the right far lateral frontal bone is nonenhancing the attributed to ossification. Vascular: See below. Deep venous structures are symmetrically enhancing Skull and upper cervical spine: Normal marrow signal. Sinuses/Orbits: Unremarkable MRA HEAD FINDINGS Mild atheromatous type irregularity to the carotid and vertebral arteries. No major branch occlusion, beading, or proximal flow limiting stenosis. There is mild to moderate narrowing of the right anterior temporal branch and right proximal PCA. No flow  related signal at the thalamic hematoma. IMPRESSION: 1. No visible mass or vascular lesion underlying the left thalamic hematoma. 2. Extensive chronic small vessel ischemia. 3. Intracranial atherosclerosis. Electronically Signed   By: Marnee Spring M.D.   On: 01/05/2020 06:24   DG Chest Port 1 View  Result Date:  01/05/2020 CLINICAL DATA:  Code stroke. Respiratory failure. EXAM: PORTABLE CHEST 1 VIEW COMPARISON:  Earlier film, same date. FINDINGS: The ET tube and NG tubes are stable. Mild central vascular congestion but no edema, infiltrates or effusions. Improved bibasilar lung aeration. IMPRESSION: 1. Stable support apparatus. 2. Improved bibasilar lung aeration. Electronically Signed   By: Rudie Meyer M.D.   On: 01/05/2020 06:29   DG Chest Port 1 View  Result Date: 01/05/2020 CLINICAL DATA:  Respiratory failure. EXAM: PORTABLE CHEST 1 VIEW COMPARISON:  Chest x-ray 01/04/2020 FINDINGS: The endotracheal tube and NG tubes are in good position, unchanged. The cardiac silhouette, mediastinal and hilar contours are stable. Small left pleural effusion and left basilar atelectasis. Stable mild vascular congestion without overt pulmonary edema. IMPRESSION: 1. Stable support apparatus. 2. Small left pleural effusion and left basilar atelectasis. 3. Stable mild vascular congestion. Electronically Signed   By: Rudie Meyer M.D.   On: 01/05/2020 05:34   ECHOCARDIOGRAM COMPLETE  Result Date: 01/05/2020    ECHOCARDIOGRAM REPORT   Patient Name:   NATACIA CHAISSON Date of Exam: 01/05/2020 Medical Rec #:  [id number]        Height:       63.0 in Accession #:    [id number]       Weight:       233.2 lb Date of Birth:  11/26/1932        BSA:          2.064 m Patient Age:    87 years         BP:           152/54 mmHg Patient Gender: F                HR:           86 bpm. Exam Location:  Inpatient Procedure: 2D Echo, Cardiac Doppler, Color Doppler and Intracardiac            Opacification Agent Indications:    Stroke 434.91 / I163.9  History:        Patient has no prior history of Echocardiogram examinations.                 Risk Factors:Hypertension and Diabetes.  Sonographer:    Elmarie Shiley Dance Referring Phys: [id number] SHARON L BIBY  Sonographer Comments: Echo performed with patient supine and on artificial respirator. IMPRESSIONS  1. Left  ventricular ejection fraction, by estimation, is 55 to 60%. The left ventricle has normal function. The left ventricle has no regional wall motion abnormalities. Left ventricular diastolic parameters are consistent with Grade II diastolic dysfunction (pseudonormalization). Elevated left ventricular end-diastolic pressure.  2. Right ventricular systolic function is normal. The right ventricular size is normal.  3. Left atrial size was mildly dilated.  4. The mitral valve is degenerative. Trivial mitral valve regurgitation. No evidence of mitral stenosis. Moderate mitral annular calcification.  5. The aortic valve is tricuspid. Aortic valve regurgitation is not visualized. Mild aortic valve sclerosis is present, with no evidence of aortic valve stenosis.  6. The inferior vena cava is normal in size with greater than 50% respiratory variability, suggesting right atrial pressure of 3 mmHg. FINDINGS  Left Ventricle: Left ventricular ejection fraction, by estimation, is 55 to 60%. The left ventricle has normal function. The left ventricle has no regional wall motion abnormalities. Definity contrast agent was given IV to delineate the left ventricular  endocardial borders. The left ventricular internal cavity size was normal in size. There is no left ventricular hypertrophy. Left ventricular diastolic parameters are consistent with Grade II diastolic dysfunction (pseudonormalization). Elevated left ventricular end-diastolic pressure. Right Ventricle: The right ventricular size is normal. No increase in right ventricular wall thickness. Right ventricular systolic function is normal. Left Atrium: Left atrial size was mildly dilated. Right Atrium: Right atrial size was normal in size. Pericardium: There is no evidence of pericardial effusion. Mitral Valve: The mitral valve is degenerative in appearance. There is mild thickening of the mitral valve leaflet(s). There is mild calcification of the mitral valve leaflet(s).  Moderate mitral annular calcification. Trivial mitral valve regurgitation. No evidence of mitral valve stenosis. Tricuspid Valve: The tricuspid valve is normal in structure. Tricuspid valve regurgitation is not demonstrated. No evidence of tricuspid stenosis. Aortic Valve: The aortic valve is tricuspid. Aortic valve regurgitation is not visualized. Mild aortic valve sclerosis is present, with no evidence of aortic valve stenosis. Pulmonic Valve: The pulmonic valve was normal in structure. Pulmonic valve regurgitation is not visualized. No evidence of pulmonic stenosis. Aorta: The aortic root is normal in size and structure. Venous: The inferior vena cava is normal in size with greater than 50% respiratory variability, suggesting right atrial pressure of 3 mmHg. IAS/Shunts: No atrial level shunt detected by color flow Doppler.  LEFT VENTRICLE PLAX 2D LVIDd:         4.43 cm  Diastology LVIDs:         3.34 cm  LV e' medial:    3.70 cm/s LV PW:         1.28 cm  LV E/e' medial:  35.7 LV IVS:        0.96 cm  LV e' lateral:   4.68 cm/s LVOT diam:     2.10 cm  LV E/e' lateral: 28.2 LV SV:         100 LV SV Index:   48 LVOT Area:     3.46 cm  RIGHT VENTRICLE             IVC RV Basal diam:  2.75 cm     IVC diam: 1.96 cm RV S prime:     11.00 cm/s TAPSE (M-mode): 1.4 cm LEFT ATRIUM              Index       RIGHT ATRIUM           Index LA diam:        3.90 cm  1.89 cm/m  RA Area:     14.70 cm LA Vol (A2C):   92.8 ml  44.96 ml/m RA Volume:   35.80 ml  17.34 ml/m LA Vol (A4C):   106.0 ml 51.35 ml/m LA Biplane Vol: 101.0 ml 48.93 ml/m  AORTIC VALVE LVOT Vmax:   117.00 cm/s LVOT Vmean:  89.500 cm/s LVOT VTI:    0.288 m  AORTA Ao Root diam: 3.00 cm Ao Asc diam:  3.20 cm MITRAL VALVE MV Area (PHT): 2.99 cm     SHUNTS MV Decel Time: 254 msec     Systemic VTI:  0.29 m MV E velocity: 132.00 cm/s  Systemic Diam: 2.10 cm MV A velocity: 147.00 cm/s MV E/A ratio:  0.90 Theron Arista  Eden Emms MD Electronically signed by Charlton Haws MD  Signature Date/Time: 01/05/2020/2:50:39 PM    Final    CT HEAD CODE STROKE WO CONTRAST  Result Date: 01/04/2020 CLINICAL DATA:  Code stroke. Neuro deficit, acute, stroke suspected. EXAM: CT HEAD WITHOUT CONTRAST TECHNIQUE: Contiguous axial images were obtained from the base of the skull through the vertex without intravenous contrast. COMPARISON:  Brain MRI 12/09/2016. FINDINGS: Brain: Mild generalized cerebral atrophy. Acute parenchymal hemorrhage centered within the left thalamus and basal ganglia measuring 1.3 x 2.7 x 1.5 cm (AP x TV x CC). Minimal surrounding edema. Local mass effect with partial effacement of the third ventricle. 2 mm rightward midline shift at the level of the septum pellucidum. Moderate ill-defined hypoattenuation within the cerebral white matter is nonspecific, but compatible chronic small vessel ischemic disease. A known chronic pontine lacunar infarct was better appreciated on the prior MRI of 11/29/2016. Redemonstrated 7 mm lobular calcified focus along the inner table of the right frontal calvarium(series 4, image 20). No demarcated cortical infarct. No extra-axial fluid collection. No midline shift. Vascular: No hyperdense vessel. Atherosclerotic calcifications Skull: Normal. Negative for fracture or focal lesion. Sinuses/Orbits: Visualized orbits show no acute finding. No significant paranasal sinus disease at the imaged levels. These results were called by telephone at the time of interpretation on 01/04/2020 at 1:24 pm to provider Dr. Iver Nestle, who verbally acknowledged these results. IMPRESSION: 1.3 x 2.7 x 1.5 cm acute parenchymal hemorrhage centered within the left thalamus and basal ganglia. Local mass effect with partial effacement of the third ventricle and 2 mm rightward midline shift at the level of the septum pellucidum. Mild cerebral atrophy with moderate chronic small vessel ischemic disease. Known chronic pontine lacunar infarct. 7 mm focus of lobular dural-based  calcification versus incidental small calcified meningioma overlying the anterior right frontal lobe. Electronically Signed   By: Jackey Loge DO   On: 01/04/2020 13:26   VAS US CAROTID  Result Date: 01/05/2020 Carotid Arterial Duplex Study Indications:       CVA. Risk Factors:      Hypertension, hyperlipidemia, Diabetes. Limitations        Today's exam was limited due to the body habitus of the                    patient, the patient's respiratory variation, patient on a                    ventilator and patient positioning. Comparison Study:  No prior studies. Performing Technologist: Chanda Busing RVT  Examination Guidelines: A complete evaluation includes B-mode imaging, spectral Doppler, color Doppler, and power Doppler as needed of all accessible portions of each vessel. Bilateral testing is considered an integral part of a complete examination. Limited examinations for reoccurring indications may be performed as noted.  Right Carotid Findings: +----------+--------+--------+--------+-----------------------+--------+           PSV cm/sEDV cm/sStenosisPlaque Description     Comments +----------+--------+--------+--------+-----------------------+--------+ CCA Prox  142     8               smooth and heterogenous         +----------+--------+--------+--------+-----------------------+--------+ CCA Distal146     15              smooth and heterogenous         +----------+--------+--------+--------+-----------------------+--------+ ICA Prox  127     11              calcific                        +----------+--------+--------+--------+-----------------------+--------+  ICA Distal77      11                                     tortuous +----------+--------+--------+--------+-----------------------+--------+ ECA       211     0                                               +----------+--------+--------+--------+-----------------------+--------+  +----------+--------+-------+--------+-------------------+           PSV cm/sEDV cmsDescribeArm Pressure (mmHG) +----------+--------+-------+--------+-------------------+ [id number]                                        +----------+--------+-------+--------+-------------------+ +---------+--------+---+--------+--+----------------------------+ VertebralPSV cm/s140EDV cm/s14Antegrade and High resistant +---------+--------+---+--------+--+----------------------------+  Left Carotid Findings: +----------+--------+--------+--------+-----------------------+--------+           PSV cm/sEDV cm/sStenosisPlaque Description     Comments +----------+--------+--------+--------+-----------------------+--------+ CCA Prox  137     0               smooth and heterogenous         +----------+--------+--------+--------+-----------------------+--------+ CCA Distal136     0                                               +----------+--------+--------+--------+-----------------------+--------+ ICA Prox  123     12              smooth and heterogenous         +----------+--------+--------+--------+-----------------------+--------+ ICA Distal113     15                                     tortuous +----------+--------+--------+--------+-----------------------+--------+ ECA       166     0                                               +----------+--------+--------+--------+-----------------------+--------+ +----------+--------+--------+--------+-------------------+           PSV cm/sEDV cm/sDescribeArm Pressure (mmHG) +----------+--------+--------+--------+-------------------+ [id number]                                         +----------+--------+--------+--------+-------------------+ +---------+--------+--+--------+-+----------------------------+ VertebralPSV cm/s85EDV cm/s9Antegrade and High resistant  +---------+--------+--+--------+-+----------------------------+   Summary: Right Carotid: Velocities in the right ICA are consistent with a 1-39% stenosis. Left Carotid: Velocities in the left ICA are consistent with a 1-39% stenosis. Vertebrals: Bilateral vertebral arteries demonstrate high resistant flow. *See table(s) above for measurements and observations.  Electronically signed by Delia Heady MD on 01/05/2020 at 1:55:03 PM.    Final      Labs:   Basic Metabolic Panel: Recent Labs  Lab 01/11/20 0109 01/11/20 0109 01/12/20 0448 01/12/20 0448 01/13/20 5284 01/13/20 1324 01/14/20 4010 01/14/20 2725 01/15/20 0355 01/16/20 0254  NA 144   < > 141  --  142  --  139  --  137 139  K 3.6   < > 3.9   < > 3.8   < > 3.6   < > 3.6 3.8  CL 101   < > 101  --  100  --  101  --  98 102  CO2 34*   < > 33*  --  30  --  26  --  27 26  GLUCOSE 130*   < > 125*  --  178*  --  166*  --  179* 104*  BUN 38*   < > 45*  --  38*  --  42*  --  68* 73*  CREATININE 1.17*   < > 1.28*  --  1.27*  --  1.32*  --  1.74* 1.61*  CALCIUM 8.5*   < > 8.3*  --  8.5*  --  8.4*  --  8.2* 8.5*  MG 2.2  --  2.4  --  2.2  --  2.4  --  2.5*  --   PHOS 3.5  --  4.0  --  3.7  --  3.9  --  5.0*  --    < > = values in this interval not displayed.   GFR Estimated Creatinine Clearance: 29.7 mL/min (A) (by C-G formula based on SCr of 1.61 mg/dL (H)). Liver Function Tests: Recent Labs  Lab 01/12/20 0448 01/13/20 0420 01/14/20 0625 01/15/20 0355 01/16/20 0254  AST 34 37 41 44* 40  ALT 37 36 39 41 43  ALKPHOS 54 52 55 51 57  BILITOT 0.4 0.5 0.7 0.4 0.5  PROT 6.3* 6.2* 6.3* 6.0* 6.5  ALBUMIN 2.5* 2.5* 2.7* 2.6* 2.9*   No results for input(s): LIPASE, AMYLASE in the last 168 hours. Recent Labs  Lab 01/13/20 0716  AMMONIA 38*   Coagulation profile No results for input(s): INR, PROTIME in the last 168 hours.  CBC: Recent Labs  Lab 01/12/20 0448 01/13/20 0420 01/14/20 0508 01/15/20 0355 01/16/20 0254  WBC 11.5*  9.7 10.0 9.8 12.9*  NEUTROABS 7.9* 6.5 6.3 6.5 8.5*  HGB 13.5 14.2 13.2 12.7 13.1  HCT 41.9 44.5 41.3 39.9 40.1  MCV 92.5 94.5 91.8 92.8 91.1  PLT 251 225 256 241 281   Cardiac Enzymes: No results for input(s): CKTOTAL, CKMB, CKMBINDEX, TROPONINI in the last 168 hours. BNP: Invalid input(s): POCBNP CBG: Recent Labs  Lab 01/15/20 1652 01/15/20 1942 01/16/20 0009 01/16/20 0359 01/16/20 0850  GLUCAP 149* 134* 120* 112* 127*   D-Dimer No results for input(s): DDIMER in the last 72 hours. Hgb A1c No results for input(s): HGBA1C in the last 72 hours. Lipid Profile No results for input(s): CHOL, HDL, LDLCALC, TRIG, CHOLHDL, LDLDIRECT in the last 72 hours. Thyroid function studies No results for input(s): TSH, T4TOTAL, T3FREE, THYROIDAB in the last 72 hours.  Invalid input(s): FREET3 Anemia work up No results for input(s): VITAMINB12, FOLATE, FERRITIN, TIBC, IRON, RETICCTPCT in the last 72 hours. Microbiology Recent Results (from the past 240 hour(s))  Culture, blood (routine x 2)     Status: None   Collection Time: 01/07/20  9:33 AM   Specimen: BLOOD LEFT ARM  Result Value Ref Range Status   Specimen Description BLOOD LEFT ARM  Final   Special Requests   Final    BOTTLES DRAWN AEROBIC AND ANAEROBIC Blood Culture results may not be optimal due to an inadequate volume of blood received in culture bottles   Culture   Final  NO GROWTH 5 DAYS Performed at [facility], [address], [place], [place] [address]    Report Status 01/12/2020 FINAL  Final  Culture, blood (routine x 2)     Status: None   Collection Time: 01/07/20  9:35 AM   Specimen: BLOOD  Result Value Ref Range Status   Specimen Description BLOOD LEFT ANTECUBITAL  Final   Special Requests   Final    BOTTLES DRAWN AEROBIC AND ANAEROBIC Blood Culture adequate volume   Culture   Final    NO GROWTH 5 DAYS Performed at [facility], [address], [place], [place] [address]    Report Status  01/12/2020 FINAL  Final  Culture, respiratory (non-expectorated)     Status: None   Collection Time: 01/07/20 10:31 PM   Specimen: Tracheal Aspirate; Respiratory  Result Value Ref Range Status   Specimen Description TRACHEAL ASPIRATE  Final   Special Requests NONE  Final   Gram Stain   Final    RARE WBC PRESENT, PREDOMINANTLY PMN MODERATE SQUAMOUS EPITHELIAL CELLS PRESENT ABUNDANT GRAM POSITIVE COCCI ABUNDANT GRAM POSITIVE RODS RARE GRAM NEGATIVE RODS    Culture   Final    FEW Normal respiratory flora-no Staph aureus or Pseudomonas seen Performed at [facility], [address] [place], [place] [address]    Report Status 01/10/2020 FINAL  Final  Urine Culture     Status: Abnormal   Collection Time: 01/13/20  4:29 PM   Specimen: Urine, Random  Result Value Ref Range Status   Specimen Description URINE, RANDOM  Final   Special Requests   Final    NONE Performed at [facility], [address] [place], [place] [address]    Culture 40,000 COLONIES/mL ENTEROCOCCUS FAECIUM (A)  Final   Report Status 01/15/2020 FINAL  Final   Organism ID, Bacteria ENTEROCOCCUS FAECIUM (A)  Final      Susceptibility   Enterococcus faecium - MIC*    AMPICILLIN <=2 SENSITIVE Sensitive     NITROFURANTOIN 64 INTERMEDIATE Intermediate     VANCOMYCIN <=0.5 SENSITIVE Sensitive     * 40,000 COLONIES/mL ENTEROCOCCUS FAECIUM     Discharge Instructions:   Discharge Instructions    Ambulatory referral to Neurology   Complete by: As directed    Follow up with stroke clinic NP (Jessica Vanschaick or Darrol Angel, if both not available, consider Manson Allan, or [facility]) at [facility] in about 4 weeks. Thanks.   Diet - low sodium heart healthy   Complete by: As directed    Dyshagia II diet   Discharge instructions   Complete by: As directed    Follow-up with your primary care physician in 1 week after discharge from CIR. follow-up with neurology as outpatient after discharge from CIR.    Increase activity slowly   Complete by: As directed    No wound care   Complete by: As directed      Allergies as of 01/16/2020      Reactions   Amlodipine Other (See Comments)   EDEMA   Ace Inhibitors Cough   Canagliflozin    INVOKANA   Morphine And Related Rash, Other (See Comments)   For long periods of time (??)      Medication List    STOP taking these medications   aspirin 81 MG chewable tablet   chlorthalidone 50 MG tablet Commonly known as: HYGROTON   cloNIDine 0.2 MG tablet Commonly known as: CATAPRES   insulin NPH-regular  Human (70-30) 100 UNIT/ML injection   losartan 100 MG tablet Commonly known as: COZAAR   meclizine 12.5 MG tablet Commonly known as: ANTIVERT   metoprolol 200 MG 24 hr tablet Commonly known as: TOPROL-XL   ondansetron 4 MG disintegrating tablet Commonly known as: Zofran ODT   Potassium Chloride ER 20 MEQ Tbcr     TAKE these medications   acetaminophen 500 MG tablet Commonly known as: TYLENOL Take 500 mg by mouth every 6 (six) hours as needed for mild pain.   albuterol 108 (90 Base) MCG/ACT inhaler Commonly known as: VENTOLIN HFA Inhale 2 puffs into the lungs every 6 (six) hours as needed for wheezing or shortness of breath.   azelastine 0.1 % nasal spray Commonly known as: ASTELIN Place 1 spray into both nostrils 2 (two) times daily. Use in each nostril as directed   CALCIUM CARBONATE-VITAMIN D PO Take 1 tablet by mouth 2 (two) times daily.   diclofenac sodium 1 % Gel Commonly known as: VOLTAREN Apply 4 g topically 4 (four) times daily. What changed:   when to take this  reasons to take this   furosemide 20 MG tablet Commonly known as: LASIX Take 20 mg by mouth See admin instructions. Take 1 tablet (20mg ) by mouth all day except on Sunday   hydrALAZINE 100 MG tablet Commonly known as: APRESOLINE Take 1 tablet (100 mg total) by mouth 3 (three) times daily.   hydrochlorothiazide 25 MG tablet Commonly known as:  HYDRODIURIL Take 1 tablet (25 mg total) by mouth daily. Start taking on: January 17, 2020   insulin aspart 100 UNIT/ML injection Commonly known as: novoLOG Inject 0-20 Units into the skin with breakfast, with lunch, and with evening meal.   insulin aspart 100 UNIT/ML injection Commonly known as: novoLOG Inject 8 Units into the skin 3 (three) times daily with meals.   insulin glargine 100 UNIT/ML injection Commonly known as: LANTUS Inject 0.5 mLs (50 Units total) into the skin 2 (two) times daily.   loratadine 10 MG tablet Commonly known as: CLARITIN Take 10 mg by mouth daily.   metoprolol tartrate 100 MG tablet Commonly known as: LOPRESSOR Take 1 tablet (100 mg total) by mouth 2 (two) times daily.   pantoprazole 40 MG tablet Commonly known as: Protonix Take 1 tablet (40 mg total) by mouth daily.   polyethylene glycol 17 g packet Commonly known as: MIRALAX / GLYCOLAX Place 17 g into feeding tube daily. Start taking on: January 17, 2020   pravastatin 20 MG tablet Commonly known as: PRAVACHOL Take 20 mg by mouth daily.   senna-docusate 8.6-50 MG tablet Commonly known as: Senokot-S Take 1 tablet by mouth 2 (two) times daily.       Follow-up Information    [facility]. Schedule an appointment as soon as possible for a visit in 4 week(s).   Specialty: Neurology Contact information: [address] [place] [phone]               Time coordinating discharge: 39 minutes  Signed:  Novella Abraha  [facility] 01/16/2020, 11:22 AM

## 2020-01-16 NOTE — Progress Notes (Signed)
Report called to 4W. Patient transported in bed with all belongings. IV left in place.

## 2020-01-16 NOTE — TOC Transition Note (Signed)
Transition of Care [facility]) - CM/SW Discharge Note   Patient Details  Name: [name] [name] MRN: [id number] Date of Birth: 07-16-1932  [facility]) CM/SW Contact:  Kermit Balo, RN Phone Number: 01/16/2020, 10:44 AM   Clinical Narrative:    Pt is discharging to CIR today. CM signing off.   Final next level of care: IP Rehab Facility Barriers to Discharge: No Barriers Identified   Patient Goals and CMS Choice        Discharge Placement                       Discharge Plan and Services                                     Social Determinants of Health (SDOH) Interventions     Readmission Risk Interventions No flowsheet data found.

## 2020-01-16 NOTE — Progress Notes (Signed)
Patient ID: [name] [name], female   DOB: [birth year], 86 y.o.   MRN: [id number] Admit to the unit, oriented to rehab, reviewed medications, therapy schedule and plan of care. Notified son Ed of admission and room number. Pamelia Hoit

## 2020-01-16 NOTE — H&P (Signed)
Physical Medicine and Rehabilitation Admission H&P    CC: Stroke with functional deficits.   HPI:  [name] [name] is an 86 year old female with history of T2DM, CKD (Dr. Eliott Nineunham) HTN, CHF, morbid obesity--BMI 1641; who was admitted on 01/04/2020 after collapsing at church during her service.  History taken from chart review due to aphasia. On admission she was found to have right hemiparesis with dysarthria.  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  She was started on Cleviprex and labetalol for BP control and required intubation for airway protection due to progressive decline in LOC.  Acute hypoxic/hypercapnic respiratory failure felt to be due to hemorrhagic stroke was able to tolerate extubation on 01/06/2020.  Follow-up MRI/MRI brain showed no visible mass or vascular lesion, extensive small vessel ischemia and acute left thalamic bleed without significant change.  Carotid Dopplers were negative for significant ICA stenosi. Echocardiogram with EF of 55-60% with no wal abnormality and moderate mitral annular calcification. Tube feeds initiated for nutritional support. IV abx added on 01/08/2020 due to ongoing lethargy as well as low-grade fevers with leukocytosis with WBC-15.3.  Blood cultures x2 from 11/10 were negative for growth.   On 01/13/2020, she was noted to have change in mental status with significant somnolence and inability to follow any commands.  Repeat CT head showed subacute left thalamic hematoma without acute or interval findings.  Dr. Jerrell BelfastAurora question seizures as cause for mental status change and EEG ordered which was negative for seizures or epileptiform discharges.  Her mentation is slowly improving with increase in verbal output and diet has been advanced to dysphagia #2, thin liquids. She has had steady worsening of renal status with recurrent leucocytosis. Mentation  continues to fluctuate. Cortak removed today. Therapy cotreatment ongoing with patient showing ability to activate RLE and stand in steady.  She continues to have limitations due to right hemiparesis with inattention, cognitive deficits decreased initiation and delayed processing as well as expressive deficits affecting ability to carry out ADLs and mobility.  CIR was recommended due to functional decline. Please see preadmission assessment from earlier today as well.     Review of Systems  Unable to perform ROS: Medical condition     Past Medical History:  Diagnosis Date  . Colon polyp   . Diabetes mellitus 1992  . Hypertension 1996  . Pancreatitis     Past Surgical History:  Procedure Laterality Date  . CHOLECYSTECTOMY    . COLONOSCOPY    . ERCP    . PARTIAL HYSTERECTOMY  [age]years old  . POLYPECTOMY    . UPPER GASTROINTESTINAL ENDOSCOPY    . WRIST FRACTURE SURGERY  left arm,[age]years old   Family history: Unable to elicit due to expressive deficits/cognition.   Social History:  Plans for discharge with family (daughter-in-law is a retired Engineer, civil (consulting)nurse) who will hire assistance as needed.  Per reports she has never smoked. She has never used smokeless tobacco.  Per  Reports she does not drink alcohol and does not use drugs  Allergies  Allergen Reactions  . Amlodipine Other (See Comments)    EDEMA  . Ace Inhibitors Cough  . Canagliflozin     INVOKANA  . Morphine And Related Rash and Other (See Comments)    For long periods of time (??)    Medications Prior to Admission  Medication Sig Dispense Refill  . acetaminophen (TYLENOL)  500 MG tablet Take 500 mg by mouth every 6 (six) hours as needed for mild pain.    Marland Kitchen albuterol (PROVENTIL HFA;VENTOLIN HFA) 108 (90 BASE) MCG/ACT inhaler Inhale 2 puffs into the lungs every 6 (six) hours as needed for wheezing or shortness of breath.    Marland Kitchen aspirin 81 MG chewable tablet Chew 81 mg by mouth daily.     Marland Kitchen azelastine (ASTELIN) 0.1 % nasal spray  Place 1 spray into both nostrils 2 (two) times daily. Use in each nostril as directed    . CALCIUM CARBONATE-VITAMIN D PO Take 1 tablet by mouth 2 (two) times daily.    . chlorthalidone (HYGROTON) 50 MG tablet Take 50 mg by mouth daily.    . cloNIDine (CATAPRES) 0.2 MG tablet Take 0.2-0.3 mg by mouth See admin instructions. 0.2mg  in am, 0.3mg  in pm    . diclofenac sodium (VOLTAREN) 1 % GEL Apply 4 g topically 4 (four) times daily. (Patient taking differently: Apply 4 g topically 4 (four) times daily as needed (knee pain). ) 100 g 0  . furosemide (LASIX) 20 MG tablet Take 20 mg by mouth See admin instructions. Take 1 tablet ( ) by mouth all day except on Sunday    . insulin NPH-regular Human (NOVOLIN 70/30) (70-30) 100 UNIT/ML injection Inject 60-100 Units into the skin See admin instructions. Check blood sugar in inject insulin as directed: Give 100 units in AM before breakfast, 60-80 units at lunch, and 70 units at dinner    . loratadine (CLARITIN) 10 MG tablet Take 10 mg by mouth daily.    Marland Kitchen losartan (COZAAR) 100 MG tablet Take 100 mg by mouth daily.    . metoprolol (TOPROL-XL) 200 MG 24 hr tablet Take 200 mg by mouth daily.    . Potassium Chloride ER 20 MEQ TBCR Take 20 mEq by mouth daily.     . pravastatin (PRAVACHOL) 20 MG tablet Take 20 mg by mouth daily.     . meclizine (ANTIVERT) 12.5 MG tablet Take 1 tablet (12.5 mg total) by mouth 3 (three) times daily as needed for dizziness. (Patient not taking: Reported on 06/11/2019) 30 tablet 0  . ondansetron (ZOFRAN ODT) 4 MG disintegrating tablet Take 1 tablet (4 mg total) by mouth every 8 (eight) hours as needed for nausea or vomiting. (Patient not taking: Reported on 01/05/2020) 20 tablet 0    Drug Regimen Review  Drug regimen was reviewed and remains appropriate with no significant issues identified  Home: Home Living Family/patient expects to be discharged to:: Private residence Living Arrangements: Alone Available Help at Discharge:  Family Type of Home: House Home Access: Ramped entrance Home Layout: One level Bathroom Shower/Tub: Health visitor: Handicapped height Bathroom Accessibility: Yes Additional Comments: Pt unable to provide home information at this time, no family present. Will continue to assess.  Lives With: Alone   Functional History: Prior Function Level of Independence: Independent Comments: per RN, family has told her the pt was "in good health"  Functional Status:  Mobility: Bed Mobility Overal bed mobility: Needs Assistance Bed Mobility: Rolling, Sidelying to Sit Rolling: Mod assist Sidelying to sit: Mod assist, HOB elevated Supine to sit: +2 for physical assistance, +2 for safety/equipment, Total assist Sit to supine: +2 for physical assistance, +2 for safety/equipment, Total assist Sit to sidelying: Total assist, +2 for physical assistance General bed mobility comments: Facilitation of rolling with feet placed on bed surface and cues to protract R shoulder; Assist to progress RLE off bed, however  pt with active activation; Assist to push trunk upright however pt pushing up through LUE Transfers Overall transfer level: Needs assistance Equipment used: Ambulation equipment used Transfer via Lift Equipment: Stedy Transfers: Sit to/from Stand Sit to Stand: Mod assist, +2 physical assistance (from bed) Stand pivot transfers: Total assist, +2 safety/equipment General transfer comment: Pt's arms over therapist's shoulder - facilitated anterior weight shift then to stand, blocking R knee. R bias, however able to activate lateral weight shift, bumping each therapist's hip. Requires increased time to process, however good follow through;Difficulty with keeping RLE "turned on". MinAx2 to come to stand from elevated surface of stedy compared to modAx2 required from lower surface of bed. Ambulation/Gait General Gait Details: unable to step feet    ADL: ADL Overall ADL's : Needs  assistance/impaired Eating/Feeding: Minimal assistance Eating/Feeding Details (indicate cue type and reason): Educated on need to let her Mom self feed and to not attempt to give her anything when she is not alert. Educated how self feeding can increase the patient's level of arousal and decrease the risk of aspiration Grooming: Wash/dry hands, Wash/dry face, Supervision/safety, Set up Grooming Details (indicate cue type and reason): pt able to wash face with LUE from bed level Upper Body Bathing: Moderate assistance, Sitting Lower Body Bathing: Maximal assistance, Bed level Upper Body Dressing : Moderate assistance, Sitting Lower Body Dressing: Total assistance, Bed level Lower Body Dressing Details (indicate cue type and reason): to don socks Toilet Transfer: Moderate assistance, +2 for physical assistance Toilet Transfer Details (indicate cue type and reason): simulated via functional mobility with stedy- MODA  +2 to stand to stedy Functional mobility during ADLs: Moderate assistance, +2 for physical assistance (sit 0 stand; Stedy) General ADL Comments: pt able to progress functional mobility this session with pt able to complete x4 sit<>stands with MIN- MOD A +2. pt continues to present with R sided weakness  and decreased activity tolerance however pt continues to be an excellent CIR candidate d/t pts strong family support and continued progression wtih therapies  Cognition: Cognition Overall Cognitive Status: Impaired/Different from baseline Arousal/Alertness: Lethargic Orientation Level: Oriented to person, Oriented to place, Oriented to situation Attention: Sustained Sustained Attention: Impaired Sustained Attention Impairment: Functional basic Problem Solving: Impaired Problem Solving Impairment: Functional basic Cognition Arousal/Alertness: Lethargic Behavior During Therapy: WFL for tasks assessed/performed Overall Cognitive Status: Impaired/Different from baseline Area of  Impairment: Attention, Memory, Safety/judgement, Awareness, Problem solving, Following commands Orientation Level: Disoriented to, Time, Situation Current Attention Level: Sustained Memory: Decreased short-term memory (Did not remember using Stedy which has beenused multipletime) Following Commands: Follows one step commands consistently, Follows one step commands with increased time Safety/Judgement: Decreased awareness of safety, Decreased awareness of deficits Awareness: Emergent Problem Solving: Decreased initiation, Slow processing, Requires verbal cues, Requires tactile cues General Comments: Pt did not recall utilizing the stedy previous sessions. She required cues to maintain attention to particular limbs during tasks or to her body position to maintain balance. Extra time required to respond to cues. Pt unaware of leaning laterally to R until cued to correct. Difficult to assess due to: Level of arousal   Blood pressure (!) 150/61, pulse 71, temperature 98.4 F (36.9 C), temperature source Oral, resp. rate 18, height 5\' 3"  (1.6 m), weight 105.3 kg, SpO2 95 %. Physical Exam Vitals reviewed.  Constitutional:      General: She is not in acute distress.    Appearance: She is obese.  HENT:     Head: Normocephalic and atraumatic.  Right Ear: External ear normal.     Left Ear: External ear normal.     Nose: Nose normal.  Eyes:     General:        Right eye: No discharge.        Left eye: No discharge.     Extraocular Movements: Extraocular movements intact.  Cardiovascular:     Rate and Rhythm: Normal rate and regular rhythm.  Pulmonary:     Effort: Pulmonary effort is normal. No respiratory distress.     Breath sounds: Normal breath sounds. No stridor.  Abdominal:     General: Abdomen is flat. Bowel sounds are normal. There is no distension.  Musculoskeletal:     Cervical back: Normal range of motion and neck supple.     Comments: No edema or tenderness in extremities    Skin:    General: Skin is warm and dry.     Comments: Right forearm with dressing CDI  Neurological:     Mental Status: She is alert.     Comments: Alert and oriented x1 Motor: RUE: 0/5 proximal distal RLE: 1+/5 proximal distal LUE/LE: 5/5 proximal distal Right facial weakness  Psychiatric:        Mood and Affect: Affect is blunt and flat.        Speech: Speech is delayed and slurred.        Cognition and Memory: Cognition is impaired. Memory is impaired.     Results for orders placed or performed during the hospital encounter of 01/04/20 (from the past 48 hour(s))  Glucose, capillary     Status: Abnormal   Collection Time: 01/14/20 11:43 AM  Result Value Ref Range   Glucose-Capillary 187 (H) 70 - 99 mg/dL    Comment: Glucose reference range applies only to samples taken after fasting for at least 8 hours.  Glucose, capillary     Status: Abnormal   Collection Time: 01/14/20  3:58 PM  Result Value Ref Range   Glucose-Capillary 147 (H) 70 - 99 mg/dL    Comment: Glucose reference range applies only to samples taken after fasting for at least 8 hours.  Glucose, capillary     Status: Abnormal   Collection Time: 01/14/20  5:32 PM  Result Value Ref Range   Glucose-Capillary 104 (H) 70 - 99 mg/dL    Comment: Glucose reference range applies only to samples taken after fasting for at least 8 hours.  Glucose, capillary     Status: Abnormal   Collection Time: 01/14/20  8:03 PM  Result Value Ref Range   Glucose-Capillary 102 (H) 70 - 99 mg/dL    Comment: Glucose reference range applies only to samples taken after fasting for at least 8 hours.  Glucose, capillary     Status: Abnormal   Collection Time: 01/14/20 11:46 PM  Result Value Ref Range   Glucose-Capillary 164 (H) 70 - 99 mg/dL    Comment: Glucose reference range applies only to samples taken after fasting for at least 8 hours.  CBC with Differential/Platelet     Status: Abnormal   Collection Time: 01/15/20  3:55 AM  Result  Value Ref Range   WBC 9.8 4.0 - 10.5 K/uL   RBC 4.30 3.87 - 5.11 MIL/uL   Hemoglobin 12.7 12.0 - 15.0 g/dL   HCT 60.4 36 - 46 %   MCV 92.8 80.0 - 100.0 fL   MCH 29.5 26.0 - 34.0 pg   MCHC 31.8 30.0 - 36.0 g/dL   RDW 14.6  11.5 - 15.5 %   Platelets 241 150 - 400 K/uL    Comment: REPEATED TO VERIFY   nRBC 0.0 0.0 - 0.2 %   Neutrophils Relative % 66 %   Neutro Abs 6.5 1.7 - 7.7 K/uL   Lymphocytes Relative 17 %   Lymphs Abs 1.7 0.7 - 4.0 K/uL   Monocytes Relative 10 %   Monocytes Absolute 1.0 0.1 - 1.0 K/uL   Eosinophils Relative 3 %   Eosinophils Absolute 0.3 0.0 - 0.5 K/uL   Basophils Relative 1 %   Basophils Absolute 0.1 0.0 - 0.1 K/uL   Immature Granulocytes 3 %   Abs Immature Granulocytes 0.27 (H) 0.00 - 0.07 K/uL    Comment: Performed at [facility], [address], [place], [place] [address]  Comprehensive metabolic panel     Status: Abnormal   Collection Time: 01/15/20  3:55 AM  Result Value Ref Range   Sodium 137 135 - 145 mmol/L   Potassium 3.6 3.5 - 5.1 mmol/L   Chloride 98 98 - 111 mmol/L   CO2 27 22 - 32 mmol/L   Glucose, Bld 179 (H) 70 - 99 mg/dL    Comment: Glucose reference range applies only to samples taken after fasting for at least 8 hours.   BUN 68 (H) 8 - 23 mg/dL   Creatinine, Ser 6.44 (H) 0.44 - 1.00 mg/dL   Calcium 8.2 (L) 8.9 - 10.3 mg/dL   Total Protein 6.0 (L) 6.5 - 8.1 g/dL   Albumin 2.6 (L) 3.5 - 5.0 g/dL   AST 44 (H) 15 - 41 U/L   ALT 41 0 - 44 U/L   Alkaline Phosphatase 51 38 - 126 U/L   Total Bilirubin 0.4 0.3 - 1.2 mg/dL   GFR, Estimated 28 (L) >60 mL/min    Comment: (NOTE) Calculated using the CKD-EPI Creatinine Equation (2021)    Anion gap 12 5 - 15    Comment: Performed at [facility], [address] [place], [place] [address]  Magnesium     Status: Abnormal   Collection Time: 01/15/20  3:55 AM  Result Value Ref Range   Magnesium 2.5 (H) 1.7 - 2.4 mg/dL    Comment: Performed at [facility], [address] [place], [place] [address]  Phosphorus     Status: Abnormal   Collection Time: 01/15/20  3:55 AM  Result Value Ref Range   Phosphorus 5.0 (H) 2.5 - 4.6 mg/dL    Comment: Performed at [facility], [address], [place], [place] [address]  Glucose, capillary     Status: Abnormal   Collection Time: 01/15/20  4:09 AM  Result Value Ref Range   Glucose-Capillary 186 (H) 70 - 99 mg/dL    Comment: Glucose reference range applies only to samples taken after fasting for at least 8 hours.  Glucose, capillary     Status: Abnormal   Collection Time: 01/15/20  8:05 AM  Result Value Ref Range   Glucose-Capillary 171 (H) 70 - 99 mg/dL    Comment: Glucose reference range applies only to samples taken after fasting for at least 8 hours.  Glucose, capillary     Status: Abnormal   Collection Time: 01/15/20 11:56 AM  Result Value Ref Range   Glucose-Capillary 209 (H) 70 - 99 mg/dL    Comment: Glucose reference range applies only to samples taken after fasting for at least 8 hours.  Glucose, capillary  Status: Abnormal   Collection Time: 01/15/20  4:52 PM  Result Value Ref Range   Glucose-Capillary 149 (H) 70 - 99 mg/dL    Comment: Glucose reference range applies only to samples taken after fasting for at least 8 hours.  Glucose, capillary     Status: Abnormal   Collection Time: 01/15/20  7:42 PM  Result Value Ref Range   Glucose-Capillary 134 (H) 70 - 99 mg/dL    Comment: Glucose reference range applies only to samples taken after fasting for at least 8 hours.  Glucose, capillary     Status: Abnormal   Collection Time: 01/16/20 12:09 AM  Result Value Ref Range   Glucose-Capillary 120 (H) 70 - 99 mg/dL    Comment: Glucose reference range applies only to samples taken after fasting for at least 8 hours.  CBC with Differential/Platelet     Status: Abnormal   Collection Time: 01/16/20  2:54 AM  Result Value Ref Range   WBC 12.9 (H) 4.0 - 10.5 K/uL   RBC 4.40 3.87 - 5.11 MIL/uL   Hemoglobin  13.1 12.0 - 15.0 g/dL   HCT 41.6 36 - 46 %   MCV 91.1 80.0 - 100.0 fL   MCH 29.8 26.0 - 34.0 pg   MCHC 32.7 30.0 - 36.0 g/dL   RDW 60.6 30.1 - 60.1 %   Platelets 281 150 - 400 K/uL   nRBC 0.0 0.0 - 0.2 %   Neutrophils Relative % 65 %   Neutro Abs 8.5 (H) 1.7 - 7.7 K/uL   Lymphocytes Relative 18 %   Lymphs Abs 2.4 0.7 - 4.0 K/uL   Monocytes Relative 10 %   Monocytes Absolute 1.2 (H) 0.1 - 1.0 K/uL   Eosinophils Relative 3 %   Eosinophils Absolute 0.3 0.0 - 0.5 K/uL   Basophils Relative 1 %   Basophils Absolute 0.1 0.0 - 0.1 K/uL   Immature Granulocytes 3 %   Abs Immature Granulocytes 0.32 (H) 0.00 - 0.07 K/uL    Comment: Performed at [facility], [address], [place], [place] [address]  Comprehensive metabolic panel     Status: Abnormal   Collection Time: 01/16/20  2:54 AM  Result Value Ref Range   Sodium 139 135 - 145 mmol/L   Potassium 3.8 3.5 - 5.1 mmol/L   Chloride 102 98 - 111 mmol/L   CO2 26 22 - 32 mmol/L   Glucose, Bld 104 (H) 70 - 99 mg/dL    Comment: Glucose reference range applies only to samples taken after fasting for at least 8 hours.   BUN 73 (H) 8 - 23 mg/dL   Creatinine, Ser 5.57 (H) 0.44 - 1.00 mg/dL   Calcium 8.5 (L) 8.9 - 10.3 mg/dL   Total Protein 6.5 6.5 - 8.1 g/dL   Albumin 2.9 (L) 3.5 - 5.0 g/dL   AST 40 15 - 41 U/L   ALT 43 0 - 44 U/L   Alkaline Phosphatase 57 38 - 126 U/L   Total Bilirubin 0.5 0.3 - 1.2 mg/dL   GFR, Estimated 31 (L) >60 mL/min    Comment: (NOTE) Calculated using the CKD-EPI Creatinine Equation (2021)    Anion gap 11 5 - 15    Comment: Performed at [facility], [address], [place], [place] [address]  Glucose, capillary     Status: Abnormal   Collection Time: 01/16/20  3:59 AM  Result Value Ref Range   Glucose-Capillary 112 (H) 70 - 99 mg/dL  Comment: Glucose reference range applies only to samples taken after fasting for at least 8 hours.  Glucose, capillary     Status: Abnormal   Collection Time:  01/16/20  8:50 AM  Result Value Ref Range   Glucose-Capillary 127 (H) 70 - 99 mg/dL    Comment: Glucose reference range applies only to samples taken after fasting for at least 8 hours.   No results found.   Medical Problem List and Plan: 1. 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  -patient may shower  -ELOS/Goals: 18-22 days/Supervision/Min A  Admit to CIR 2.  Antithrombotics: -DVT/anticoagulation:  Pharmaceutical: Heparin             -antiplatelet therapy: N/a 3. Pain Management: Tylenol as needed 4. Mood: LCSW to follow for evaluation and support when appropriate.             -antipsychotic agents: N/A 5. Neuropsych: This patient is not capable of making decisions on her own behalf. 6. Skin/Wound Care: Routine pressure-relief measures.  Maintain adequate nutrition and hydration status. 7. Fluids/Electrolytes/Nutrition: Monitor intake/output--will order calorie count as cortak removed today. Will need assistance with feeding as due to fluctuating bouts of lethargy.  8. HTN: Monitor BP TID--continue Lasix, Hydralazine, Metoprolol, HCTZ,    Monitor with increased mobility. 9. Acute on chronic renal failure: BUN/SCr 34/1.5 at admission -->73/1.61--Question due to multiple diuretics on board. Also has elevated phos/Mg levels-->repeat labs ordered.  Will start patient on IVFfor gentle hydration.  10. PNA?/Tracheobronchitis: Completed course of antibiotics on  11. Leukocytosis: Continue to monitor for signs of infection.   CBC ordered 12. T2DM with hyperglycemia:  Monitor BS ac/hs. Continue Lantus 50 units bid--d/c meal coverage as tube feeds d/c.   Monitor with increased mobility 13. Post stroke dysphagia :  D2 thins, advance diet as tolerated.  Jacquelynn Cree,  PA-C 01/16/2020  I have personally performed a face to face diagnostic evaluation, including, but not limited to relevant history and physical exam findings, of this patient and developed relevant assessment and plan.  Additionally, I have reviewed and concur with the physician assistant's documentation above.  Maryla Morrow, MD, ABPMR

## 2020-01-16 NOTE — Progress Notes (Signed)
inpatient Rehabilitation Medication Review by a Pharmacist  A complete drug regimen review was completed for this patient to identify any potential clinically significant medication issues.  Clinically significant medication issues were identified:  no  Check AMION for pharmacist assigned to patient if future medication questions/issues arise during this admission.  Pharmacist comments:   Time spent performing this drug regimen review (minutes):  15 minutes   Elwin Sleight 01/16/2020 4:58 PM

## 2020-01-16 NOTE — Progress Notes (Signed)
PMR Admission Coordinator Pre-Admission Assessment   Patient: [name] [name] is an 86 y.o., female MRN: [id number] DOB: [birth year] Height: _0  (160 cm) Weight: 105.3 kg                                                                                                                                                  Insurance Information HMO: yes    PPO:      PCP:      IPA:      80/20:      OTHER:  PRIMARY: [employer] Medicare      Policy#: [id number]      Subscriber: Pt CM Name: Adah Salvage      Phone#: [phone]     Fax#: [phone] Pre-Cert#: [id number] auth for CIR provided by Edwena Blow for admit to CIR with updates due 7 days after admit (11/24) to fax listed above      Employer:  Benefits:  Phone #: [phone]    Name:  Eff. Date: 02/28/2019 - present     Deduct: Deductible: $0 (does not have deductible)      Out of Pocket Max: $3,600 ($419.27 met)      Life Max: n/a  CIR: $295/day for days 1-5     SNF: 20 full days Outpatient:  $30/visit co-pay Home Health:  100%  Co-Ins:  DME: 80%       Co-ins: 20%  Providers: In network   SECONDARY: none    Emergency Contact Information Contact Information       Name Relation Home Work Mobile    Donate,[name] Son     [phone]    Vallery Sa Daughter [phone]   [phone]         Current Medical History  Patient Admitting Diagnosis: L Basal Ganglia/Thalamic Hemorrhage   History of Present Illness: [name] [name] is a 86 y.o. right-handed female with history of pancreatitis, obesity with BMI 93.73, diastolic congestive heart failure, diabetes mellitus and hypertension.  She presented on 01/04/2020 with right hemiparesis and aphasia while at church.  Admission chemistries glucose 139, BUN 31, creatinine 1.39, hemoglobin 15.4, hemoglobin A1c 8.6.  Cranial CT scan showed a 1.3 x 2.7 x 1.5 cm acute parenchymal hemorrhage centered within the left thalamus and basal ganglia.  Local mass-effect with partial effacement of the third  ventricle and 2 mm rightward midline shift at the level of the septum pellucidum.  Known chronic pontine lacunar infarct.  Patient did not receive TPA.  Placed on Cleviprex for blood pressure control.  Required intubation initially for airway protection and extubated on 01/06/2020 and followed by pulmonary services felt to be high risk for reintubation.  MRI/MRA revealed no visible mass or vascular lesion underlying the left thalamic hematoma.  Extensive chronic small vessel ischemia.  No major branch occlusion or  limiting stenosis.  Echocardiogram with ejection fraction  55-60%, no wall motion abnormalities grade 2 diastolic dysfunction.  Hospital course further complicated by postop dysphagia.  She is currently n.p.o. with alternative means of nutritional support.  Therapy evaluations completed with recommendations of physical medicine rehab consult.   Complete NIHSS TOTAL: 13 Glasgow Coma Scale Score: 15   Past Medical History      Past Medical History:  Diagnosis Date  . Colon polyp    . Diabetes mellitus 1992  . Hypertension 1996  . Pancreatitis        Family History  family history is not on file.   Prior Rehab/Hospitalizations:  Has the patient had prior rehab or hospitalizations prior to admission? Yes   Has the patient had major surgery during 100 days prior to admission? Yes   Current Medications    Current Facility-Administered Medications:  .  [DISCONTINUED] acetaminophen (TYLENOL) tablet 650 mg, 650 mg, Oral, Q4H PRN **OR** acetaminophen (TYLENOL) 160 MG/5ML solution 650 mg, 650 mg, Per Tube, Q4H PRN, 650 mg at 01/09/20 2131 **OR** acetaminophen (TYLENOL) suppository 650 mg, 650 mg, Rectal, Q4H PRN, Biby, Sharon L, NP .  albuterol (PROVENTIL) (2.5 MG/3ML) 0.083% nebulizer solution 2.5 mg, 2.5 mg, Nebulization, Q6H PRN, Biby, Sharon L, NP .  chlorhexidine (PERIDEX) 0.12 % solution 15 mL, 15 mL, Mouth Rinse, BID, Biby, Sharon L, NP, 15 mL at 01/16/20 0948 .  Chlorhexidine  Gluconate Cloth 2 % PADS 6 each, 6 each, Topical, Daily, Bhagat, Srishti L, MD, 6 each at 01/16/20 0949 .  dextrose 50 % solution 0-50 mL, 0-50 mL, Intravenous, PRN, Biby, Sharon L, NP .  docusate (COLACE) 50 MG/5ML liquid 100 mg, 100 mg, Per Tube, BID, Biby, Sharon L, NP, 100 mg at 01/15/20 2111 .  feeding supplement (ENSURE ENLIVE / ENSURE PLUS) liquid 237 mL, 237 mL, Oral, BID BM, Allie Bossier, MD, 237 mL at 01/15/20 1445 .  feeding supplement (GLUCERNA 1.2 CAL) liquid 960 mL, 960 mL, Per Tube, Q24H, Allie Bossier, MD, Last Rate: 80 mL/hr at 01/15/20 1843, 960 mL at 01/15/20 1843 .  feeding supplement (PROSource TF) liquid 90 mL, 90 mL, Per Tube, BID, Allie Bossier, MD, 90 mL at 01/16/20 0945 .  free water 200 mL, 200 mL, Per Tube, Q6H, Rosalin Hawking, MD, 200 mL at 01/16/20 0619 .  furosemide (LASIX) tablet 20 mg, 20 mg, Per Tube, Daily, Biby, Sharon L, NP, 20 mg at 01/16/20 0946 .  heparin injection 5,000 Units, 5,000 Units, Subcutaneous, Q8H, Rosalin Hawking, MD, 5,000 Units at 01/16/20 0505 .  hydrALAZINE (APRESOLINE) tablet 100 mg, 100 mg, Per Tube, Q8H, Opyd, Ilene Qua, MD, 100 mg at 01/16/20 1002 .  hydrochlorothiazide (HYDRODIURIL) tablet 25 mg, 25 mg, Per Tube, Daily, Biby, Sharon L, NP, 25 mg at 01/16/20 0946 .  insulin aspart (novoLOG) injection 0-20 Units, 0-20 Units, Subcutaneous, Q4H, Donzetta Starch, NP, 3 Units at 01/16/20 0947 .  insulin aspart (novoLOG) injection 8 Units, 8 Units, Subcutaneous, Q4H, Biby, Sharon L, NP, 8 Units at 01/16/20 0948 .  insulin glargine (LANTUS) injection 50 Units, 50 Units, Subcutaneous, BID, Donzetta Starch, NP, 50 Units at 01/16/20 0947 .  labetalol (NORMODYNE) injection 10-20 mg, 10-20 mg, Intravenous, Q2H PRN, Allie Bossier, MD, 10 mg at 01/14/20 0353 .  MEDLINE mouth rinse, 15 mL, Mouth Rinse, q12n4p, Biby, Sharon L, NP, 15 mL at 01/15/20 1717 .  metoprolol tartrate (LOPRESSOR) tablet 100 mg, 100 mg, Per Tube, BID, Biby, Sharon L, NP, 100 mg at  01/16/20 0946 .  pantoprazole sodium (PROTONIX) 40 mg/20 mL oral suspension 40 mg, 40 mg, Per Tube, Daily, Biby, Sharon L, NP, 40 mg at 01/16/20 0947 .  polyethylene glycol (MIRALAX / GLYCOLAX) packet 17 g, 17 g, Per Tube, Daily, Biby, Sharon L, NP, 17 g at 01/14/20 1155 .  senna-docusate (Senokot-S) tablet 1 tablet, 1 tablet, Per Tube, BID, Donzetta Starch, NP, 1 tablet at 01/16/20 0946   Patients Current Diet:  Diet Order                  DIET DYS 2 Room service appropriate? Yes; Fluid consistency: Thin  Diet effective now                         Precautions / Restrictions Precautions Precautions: Fall Precaution Comments: R-sided hemiparesis, coretrack Restrictions Weight Bearing Restrictions: No    Has the patient had 2 or more falls or a fall with injury in the past year?Yes   Prior Activity Level Community (5-7x/wk): Pt. was active in the community PTA   Prior Functional Level Prior Function Level of Independence: Independent Comments: per RN, family has told her the pt was "in good health"   Self Care: Did the patient need help bathing, dressing, using the toilet or eating?  Independent   Indoor Mobility: Did the patient need assistance with walking from room to room (with or without device)? Independent   Stairs: Did the patient need assistance with internal or external stairs (with or without device)? Independent   Functional Cognition: Did the patient need help planning regular tasks such as shopping or remembering to take medications? Independent   Home Assistive Devices / Equipment  n/a   Prior Device Use: Indicate devices/aids used by the patient prior to current illness, exacerbation or injury? None of the above   Current Functional Level Cognition   Arousal/Alertness: Lethargic Overall Cognitive Status: Impaired/Different from baseline Difficult to assess due to: Level of arousal Current Attention Level: Sustained Orientation Level: Oriented to  person, Oriented to place, Oriented to situation Following Commands: Follows one step commands consistently, Follows one step commands with increased time Safety/Judgement: Decreased awareness of safety, Decreased awareness of deficits General Comments: Pt did not recall utilizing the stedy previous sessions. She required cues to maintain attention to particular limbs during tasks or to her body position to maintain balance. Extra time required to respond to cues. Pt unaware of leaning laterally to R until cued to correct. Attention: Sustained Sustained Attention: Impaired Sustained Attention Impairment: Functional basic Problem Solving: Impaired Problem Solving Impairment: Functional basic    Extremity Assessment (includes Sensation/Coordination)   Upper Extremity Assessment: RUE deficits/detail RUE Deficits / Details: Reports arm is "tingling" - increased sensation RUE Sensation: decreased light touch RUE Coordination: decreased fine motor, decreased gross motor LUE Deficits / Details: grossly 2-3/5 throughout LUE Coordination: decreased fine motor, decreased gross motor  Lower Extremity Assessment: Defer to PT evaluation RLE Deficits / Details: limited command following, no moving against gravity at this time LLE Deficits / Details: able to move against gravity for SAQ, but unable to follow commands for moving legs OOB     ADLs   Overall ADL's : Needs assistance/impaired Eating/Feeding: Minimal assistance Eating/Feeding Details (indicate cue type and reason): Educated on need to let her Mom self feed and to not attempt to give her anything when she is not alert. Educated how self feeding can increase the patient's level of arousal and decrease the  risk of aspiration Grooming: Wash/dry hands, Wash/dry face, Supervision/safety, Set up Grooming Details (indicate cue type and reason): pt able to wash face with LUE from bed level Upper Body Bathing: Moderate assistance, Sitting Lower Body  Bathing: Maximal assistance, Bed level Upper Body Dressing : Moderate assistance, Sitting Lower Body Dressing: Total assistance, Bed level Lower Body Dressing Details (indicate cue type and reason): to don socks Toilet Transfer: Moderate assistance, +2 for physical assistance Toilet Transfer Details (indicate cue type and reason): simulated via functional mobility with stedy- MODA  +2 to stand to stedy Functional mobility during ADLs: Moderate assistance, +2 for physical assistance (sit 0 stand; Stedy) General ADL Comments: pt able to progress functional mobility this session with pt able to complete x4 sit<>stands with MIN- MOD A +2. pt continues to present with R sided weakness  and decreased activity tolerance however pt continues to be an excellent CIR candidate d/t pts strong family support and continued progression wtih therapies     Mobility   Overal bed mobility: Needs Assistance Bed Mobility: Rolling, Sidelying to Sit Rolling: Mod assist Sidelying to sit: Mod assist, HOB elevated Supine to sit: +2 for physical assistance, +2 for safety/equipment, Total assist Sit to supine: +2 for physical assistance, +2 for safety/equipment, Total assist Sit to sidelying: Total assist, +2 for physical assistance General bed mobility comments: Facilitation of rolling with feet placed on bed surface and cues to protract R shoulder; Assist to progress RLE off bed, however pt with active activation; Assist to push trunk upright however pt pushing up through LUE     Transfers   Overall transfer level: Needs assistance Equipment used: Ambulation equipment used Transfer via Lift Equipment: Stedy Transfers: Sit to/from Stand Sit to Stand: Mod assist, +2 physical assistance (from bed) Stand pivot transfers: Total assist, +2 safety/equipment General transfer comment: Pt's arms over therapist's shoulder - facilitated anterior weight shift then to stand, blocking R knee. R bias, however able to activate  lateral weight shift, bumping each therapist's hip. Requires increased time to process, however good follow through;Difficulty with keeping RLE "turned on". MinAx2 to come to stand from elevated surface of stedy compared to modAx2 required from lower surface of bed.     Ambulation / Gait / Stairs / Wheelchair Mobility   Ambulation/Gait General Gait Details: unable to step feet     Posture / Balance Dynamic Sitting Balance Sitting balance - Comments: Balance affected by inattention; when distracted, pt with R bias Balance Overall balance assessment: Needs assistance Sitting-balance support: Feet supported (intermittent UE support) Sitting balance-Leahy Scale: Fair Sitting balance - Comments: Balance affected by inattention; when distracted, pt with R bias Postural control: Right lateral lean Standing balance support: Bilateral upper extremity supported Standing balance-Leahy Scale: Poor Standing balance comment: Standing x3 bouts of ~2 min > ~1 min > ~30 sec. Pt leans to R. Thus, required cues to shift weight to L along with R knee block and hand-over-hand positioning of R hand on stedy for safety. Intermittent moments of minA-min guard assist.     Special needs/care consideration  Skin; serous blister on R arm, Diabetic management: DM2, managed with sQ Novolog and Lantus,  and Special service needs [place] (from acute therapy documentation) Living Arrangements: Alone  Lives With: Alone Available Help at Discharge: Family Type of Home: House Home Layout: One level Home Access: Ramped entrance Bathroom Shower/Tub: Holiday representative Toilet: Handicapped height Bathroom Accessibility: Yes How Accessible: Accessible via  walker Home Care Services: No Additional Comments: Pt unable to provide home information at this time, no family present. Will continue to assess.   Discharge Living Setting Plans for Discharge Living Setting: Patient's home Type  of Home at Discharge: House Discharge Home Layout: One level Discharge Home Access: [place] entrance Discharge Bathroom Shower/Tub: Walk-in shower Discharge Bathroom Toilet: Handicapped height Discharge Bathroom Accessibility: Yes How Accessible: Accessible via wheelchair, Accessible via walker Does the patient have any problems obtaining your medications?: No   Social/Family/Support Systems Patient Roles: Other (Comment) Contact Information: [phone] Anticipated Caregiver: Valeree Leidy (son) states that he and 3 other children will rotate to care for Pt.  Anticipated Caregiver's Contact Information: [phone] Caregiver Availability: 24/7 Discharge Plan Discussed with Primary Caregiver: Yes Is Caregiver In Agreement with Plan?: Yes Does Caregiver/Family have Issues with Lodging/Transportation while Pt is in Rehab?: No     Goals Patient/Family Goal for Rehab: PT/OT/SLP Min A/Supervision Expected length of stay: 18-21 days. Pt/Family Agrees to Admission and willing to participate: Yes Program Orientation Provided & Reviewed with Pt/Caregiver Including Roles  & Responsibilities: Yes     Decrease burden of Care through IP rehab admission: n/a   Possible need for SNF placement upon discharge: not anticipated     Patient Condition: This patient's medical and functional status has changed since the consult dated: 01/09/2020 in which the Rehabilitation Physician determined and documented that the patient's condition is appropriate for intensive rehabilitative care in an inpatient rehabilitation facility. See "History of Present Illness" (above) for medical update. Functional changes are: mod +2 for transfers and max +2 for ADLs. Patient's medical and functional status update has been discussed with the Rehabilitation physician and patient remains appropriate for inpatient rehabilitation. Will admit to inpatient rehab today.   Preadmission Screen Completed By:  Genella Mech, CCC-SLP,  and brief updates by Shann Medal, PT, DPT  01/16/2020 10:39 AM ______________________________________________________________________   Discussed status with Dr. Posey Pronto on 01/16/20 at  10:39 AM  and received approval for admission today.   Admission Coordinator:  Michel Santee, time 10:39 AM Sudie Grumbling 01/16/20

## 2020-01-16 NOTE — H&P (Signed)
Physical Medicine and Rehabilitation Admission H&P    CC: Stroke with functional deficits.   HPI:  [name] [name] is an 86 year old female with history of T2DM, CKD (Dr. Eliott Nineunham) HTN, CHF, morbid obesity--BMI 1641; who was admitted on 01/04/2020 after collapsing at church during her service.  History taken from chart review due to aphasia. On admission she was found to have right hemiparesis with dysarthria.  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  She was started on Cleviprex and labetalol for BP control and required intubation for airway protection due to progressive decline in LOC.  Acute hypoxic/hypercapnic respiratory failure felt to be due to hemorrhagic stroke was able to tolerate extubation on 01/06/2020.  Follow-up MRI/MRI brain showed no visible mass or vascular lesion, extensive small vessel ischemia and acute left thalamic bleed without significant change.  Carotid Dopplers were negative for significant ICA stenosi. Echocardiogram with EF of 55-60% with no wal abnormality and moderate mitral annular calcification. Tube feeds initiated for nutritional support. IV abx added on 01/08/2020 due to ongoing lethargy as well as low-grade fevers with leukocytosis with WBC-15.3.  Blood cultures x2 from 11/10 were negative for growth.   On 01/13/2020, she was noted to have change in mental status with significant somnolence and inability to follow any commands.  Repeat CT head showed subacute left thalamic hematoma without acute or interval findings.  Dr. Jerrell BelfastAurora question seizures as cause for mental status change and EEG ordered which was negative for seizures or epileptiform discharges.  Her mentation is slowly improving with increase in verbal output and diet has been advanced to dysphagia #2, thin liquids. She has had steady worsening of renal status with recurrent leucocytosis. Mentation  continues to fluctuate. Cortak removed today. Therapy cotreatment ongoing with patient showing ability to activate RLE and stand in steady.  She continues to have limitations due to right hemiparesis with inattention, cognitive deficits decreased initiation and delayed processing as well as expressive deficits affecting ability to carry out ADLs and mobility.  CIR was recommended due to functional decline. Please see preadmission assessment from earlier today as well.     Review of Systems  Unable to perform ROS: Medical condition     Past Medical History:  Diagnosis Date  . Colon polyp   . Diabetes mellitus 1992  . Hypertension 1996  . Pancreatitis     Past Surgical History:  Procedure Laterality Date  . CHOLECYSTECTOMY    . COLONOSCOPY    . ERCP    . PARTIAL HYSTERECTOMY  [age]years old  . POLYPECTOMY    . UPPER GASTROINTESTINAL ENDOSCOPY    . WRIST FRACTURE SURGERY  left arm,[age]years old   Family history: Unable to elicit due to expressive deficits/cognition.   Social History:  Plans for discharge with family (daughter-in-law is a retired Engineer, civil (consulting)nurse) who will hire assistance as needed.  Per reports she has never smoked. She has never used smokeless tobacco.  Per  Reports she does not drink alcohol and does not use drugs  Allergies  Allergen Reactions  . Amlodipine Other (See Comments)    EDEMA  . Ace Inhibitors Cough  . Canagliflozin     INVOKANA  . Morphine And Related Rash and Other (See Comments)    For long periods of time (??)    Medications Prior to Admission  Medication Sig Dispense Refill  . acetaminophen (TYLENOL)  500 MG tablet Take 500 mg by mouth every 6 (six) hours as needed for mild pain.    Marland Kitchen albuterol (PROVENTIL HFA;VENTOLIN HFA) 108 (90 BASE) MCG/ACT inhaler Inhale 2 puffs into the lungs every 6 (six) hours as needed for wheezing or shortness of breath.    Marland Kitchen aspirin 81 MG chewable tablet Chew 81 mg by mouth daily.     Marland Kitchen azelastine (ASTELIN) 0.1 % nasal spray  Place 1 spray into both nostrils 2 (two) times daily. Use in each nostril as directed    . CALCIUM CARBONATE-VITAMIN D PO Take 1 tablet by mouth 2 (two) times daily.    . chlorthalidone (HYGROTON) 50 MG tablet Take 50 mg by mouth daily.    . cloNIDine (CATAPRES) 0.2 MG tablet Take 0.2-0.3 mg by mouth See admin instructions. 0.2mg  in am, 0.3mg  in pm    . diclofenac sodium (VOLTAREN) 1 % GEL Apply 4 g topically 4 (four) times daily. (Patient taking differently: Apply 4 g topically 4 (four) times daily as needed (knee pain). ) 100 g 0  . furosemide (LASIX) 20 MG tablet Take 20 mg by mouth See admin instructions. Take 1 tablet ( ) by mouth all day except on Sunday    . insulin NPH-regular Human (NOVOLIN 70/30) (70-30) 100 UNIT/ML injection Inject 60-100 Units into the skin See admin instructions. Check blood sugar in inject insulin as directed: Give 100 units in AM before breakfast, 60-80 units at lunch, and 70 units at dinner    . loratadine (CLARITIN) 10 MG tablet Take 10 mg by mouth daily.    Marland Kitchen losartan (COZAAR) 100 MG tablet Take 100 mg by mouth daily.    . metoprolol (TOPROL-XL) 200 MG 24 hr tablet Take 200 mg by mouth daily.    . Potassium Chloride ER 20 MEQ TBCR Take 20 mEq by mouth daily.     . pravastatin (PRAVACHOL) 20 MG tablet Take 20 mg by mouth daily.     . meclizine (ANTIVERT) 12.5 MG tablet Take 1 tablet (12.5 mg total) by mouth 3 (three) times daily as needed for dizziness. (Patient not taking: Reported on 06/11/2019) 30 tablet 0  . ondansetron (ZOFRAN ODT) 4 MG disintegrating tablet Take 1 tablet (4 mg total) by mouth every 8 (eight) hours as needed for nausea or vomiting. (Patient not taking: Reported on 01/05/2020) 20 tablet 0    Drug Regimen Review  Drug regimen was reviewed and remains appropriate with no significant issues identified  Home: Home Living Family/patient expects to be discharged to:: Private residence Living Arrangements: Alone Available Help at Discharge:  Family Type of Home: House Home Access: Ramped entrance Home Layout: One level Bathroom Shower/Tub: Health visitor: Handicapped height Bathroom Accessibility: Yes Additional Comments: Pt unable to provide home information at this time, no family present. Will continue to assess.  Lives With: Alone   Functional History: Prior Function Level of Independence: Independent Comments: per RN, family has told her the pt was "in good health"  Functional Status:  Mobility: Bed Mobility Overal bed mobility: Needs Assistance Bed Mobility: Rolling, Sidelying to Sit Rolling: Mod assist Sidelying to sit: Mod assist, HOB elevated Supine to sit: +2 for physical assistance, +2 for safety/equipment, Total assist Sit to supine: +2 for physical assistance, +2 for safety/equipment, Total assist Sit to sidelying: Total assist, +2 for physical assistance General bed mobility comments: Facilitation of rolling with feet placed on bed surface and cues to protract R shoulder; Assist to progress RLE off bed, however  pt with active activation; Assist to push trunk upright however pt pushing up through LUE Transfers Overall transfer level: Needs assistance Equipment used: Ambulation equipment used Transfer via Lift Equipment: Stedy Transfers: Sit to/from Stand Sit to Stand: Mod assist, +2 physical assistance (from bed) Stand pivot transfers: Total assist, +2 safety/equipment General transfer comment: Pt's arms over therapist's shoulder - facilitated anterior weight shift then to stand, blocking R knee. R bias, however able to activate lateral weight shift, bumping each therapist's hip. Requires increased time to process, however good follow through;Difficulty with keeping RLE "turned on". MinAx2 to come to stand from elevated surface of stedy compared to modAx2 required from lower surface of bed. Ambulation/Gait General Gait Details: unable to step feet    ADL: ADL Overall ADL's : Needs  assistance/impaired Eating/Feeding: Minimal assistance Eating/Feeding Details (indicate cue type and reason): Educated on need to let her Mom self feed and to not attempt to give her anything when she is not alert. Educated how self feeding can increase the patient's level of arousal and decrease the risk of aspiration Grooming: Wash/dry hands, Wash/dry face, Supervision/safety, Set up Grooming Details (indicate cue type and reason): pt able to wash face with LUE from bed level Upper Body Bathing: Moderate assistance, Sitting Lower Body Bathing: Maximal assistance, Bed level Upper Body Dressing : Moderate assistance, Sitting Lower Body Dressing: Total assistance, Bed level Lower Body Dressing Details (indicate cue type and reason): to don socks Toilet Transfer: Moderate assistance, +2 for physical assistance Toilet Transfer Details (indicate cue type and reason): simulated via functional mobility with stedy- MODA  +2 to stand to stedy Functional mobility during ADLs: Moderate assistance, +2 for physical assistance (sit 0 stand; Stedy) General ADL Comments: pt able to progress functional mobility this session with pt able to complete x4 sit<>stands with MIN- MOD A +2. pt continues to present with R sided weakness  and decreased activity tolerance however pt continues to be an excellent CIR candidate d/t pts strong family support and continued progression wtih therapies  Cognition: Cognition Overall Cognitive Status: Impaired/Different from baseline Arousal/Alertness: Lethargic Orientation Level: Oriented to person, Oriented to place, Oriented to situation Attention: Sustained Sustained Attention: Impaired Sustained Attention Impairment: Functional basic Problem Solving: Impaired Problem Solving Impairment: Functional basic Cognition Arousal/Alertness: Lethargic Behavior During Therapy: WFL for tasks assessed/performed Overall Cognitive Status: Impaired/Different from baseline Area of  Impairment: Attention, Memory, Safety/judgement, Awareness, Problem solving, Following commands Orientation Level: Disoriented to, Time, Situation Current Attention Level: Sustained Memory: Decreased short-term memory (Did not remember using Stedy which has beenused multipletime) Following Commands: Follows one step commands consistently, Follows one step commands with increased time Safety/Judgement: Decreased awareness of safety, Decreased awareness of deficits Awareness: Emergent Problem Solving: Decreased initiation, Slow processing, Requires verbal cues, Requires tactile cues General Comments: Pt did not recall utilizing the stedy previous sessions. She required cues to maintain attention to particular limbs during tasks or to her body position to maintain balance. Extra time required to respond to cues. Pt unaware of leaning laterally to R until cued to correct. Difficult to assess due to: Level of arousal   Blood pressure (!) 150/61, pulse 71, temperature 98.4 F (36.9 C), temperature source Oral, resp. rate 18, height 5\' 3"  (1.6 m), weight 105.3 kg, SpO2 95 %. Physical Exam Vitals reviewed.  Constitutional:      General: She is not in acute distress.    Appearance: She is obese.  HENT:     Head: Normocephalic and atraumatic.  Right Ear: External ear normal.     Left Ear: External ear normal.     Nose: Nose normal.  Eyes:     General:        Right eye: No discharge.        Left eye: No discharge.     Extraocular Movements: Extraocular movements intact.  Cardiovascular:     Rate and Rhythm: Normal rate and regular rhythm.  Pulmonary:     Effort: Pulmonary effort is normal. No respiratory distress.     Breath sounds: Normal breath sounds. No stridor.  Abdominal:     General: Abdomen is flat. Bowel sounds are normal. There is no distension.  Musculoskeletal:     Cervical back: Normal range of motion and neck supple.     Comments: No edema or tenderness in extremities   Skin:    General: Skin is warm and dry.     Comments: Right forearm with dressing CDI  Neurological:     Mental Status: She is alert.     Comments: Alert and oriented x1 Motor: RUE: 0/5 proximal distal RLE: 1+/5 proximal distal LUE/LE: 5/5 proximal distal Right facial weakness  Psychiatric:        Mood and Affect: Affect is blunt and flat.        Speech: Speech is delayed and slurred.        Cognition and Memory: Cognition is impaired. Memory is impaired.     Results for orders placed or performed during the hospital encounter of 01/04/20 (from the past 48 hour(s))  Glucose, capillary     Status: Abnormal   Collection Time: 01/14/20 11:43 AM  Result Value Ref Range   Glucose-Capillary 187 (H) 70 - 99 mg/dL    Comment: Glucose reference range applies only to samples taken after fasting for at least 8 hours.  Glucose, capillary     Status: Abnormal   Collection Time: 01/14/20  3:58 PM  Result Value Ref Range   Glucose-Capillary 147 (H) 70 - 99 mg/dL    Comment: Glucose reference range applies only to samples taken after fasting for at least 8 hours.  Glucose, capillary     Status: Abnormal   Collection Time: 01/14/20  5:32 PM  Result Value Ref Range   Glucose-Capillary 104 (H) 70 - 99 mg/dL    Comment: Glucose reference range applies only to samples taken after fasting for at least 8 hours.  Glucose, capillary     Status: Abnormal   Collection Time: 01/14/20  8:03 PM  Result Value Ref Range   Glucose-Capillary 102 (H) 70 - 99 mg/dL    Comment: Glucose reference range applies only to samples taken after fasting for at least 8 hours.  Glucose, capillary     Status: Abnormal   Collection Time: 01/14/20 11:46 PM  Result Value Ref Range   Glucose-Capillary 164 (H) 70 - 99 mg/dL    Comment: Glucose reference range applies only to samples taken after fasting for at least 8 hours.  CBC with Differential/Platelet     Status: Abnormal   Collection Time: 01/15/20  3:55 AM  Result  Value Ref Range   WBC 9.8 4.0 - 10.5 K/uL   RBC 4.30 3.87 - 5.11 MIL/uL   Hemoglobin 12.7 12.0 - 15.0 g/dL   HCT 40.939.9 36 - 46 %   MCV 92.8 80.0 - 100.0 fL   MCH 29.5 26.0 - 34.0 pg   MCHC 31.8 30.0 - 36.0 g/dL   RDW 81.114.6 91.411.5 -  15.5 %   Platelets 241 150 - 400 K/uL    Comment: REPEATED TO VERIFY   nRBC 0.0 0.0 - 0.2 %   Neutrophils Relative % 66 %   Neutro Abs 6.5 1.7 - 7.7 K/uL   Lymphocytes Relative 17 %   Lymphs Abs 1.7 0.7 - 4.0 K/uL   Monocytes Relative 10 %   Monocytes Absolute 1.0 0.1 - 1.0 K/uL   Eosinophils Relative 3 %   Eosinophils Absolute 0.3 0.0 - 0.5 K/uL   Basophils Relative 1 %   Basophils Absolute 0.1 0.0 - 0.1 K/uL   Immature Granulocytes 3 %   Abs Immature Granulocytes 0.27 (H) 0.00 - 0.07 K/uL    Comment: Performed at [facility], [address] [place], [place] [address]  Comprehensive metabolic panel     Status: Abnormal   Collection Time: 01/15/20  3:55 AM  Result Value Ref Range   Sodium 137 135 - 145 mmol/L   Potassium 3.6 3.5 - 5.1 mmol/L   Chloride 98 98 - 111 mmol/L   CO2 27 22 - 32 mmol/L   Glucose, Bld 179 (H) 70 - 99 mg/dL    Comment: Glucose reference range applies only to samples taken after fasting for at least 8 hours.   BUN 68 (H) 8 - 23 mg/dL   Creatinine, Ser 6.04 (H) 0.44 - 1.00 mg/dL   Calcium 8.2 (L) 8.9 - 10.3 mg/dL   Total Protein 6.0 (L) 6.5 - 8.1 g/dL   Albumin 2.6 (L) 3.5 - 5.0 g/dL   AST 44 (H) 15 - 41 U/L   ALT 41 0 - 44 U/L   Alkaline Phosphatase 51 38 - 126 U/L   Total Bilirubin 0.4 0.3 - 1.2 mg/dL   GFR, Estimated 28 (L) >60 mL/min    Comment: (NOTE) Calculated using the CKD-EPI Creatinine Equation (2021)    Anion gap 12 5 - 15    Comment: Performed at [facility], [address], [place], [place] [address]  Magnesium     Status: Abnormal   Collection Time: 01/15/20  3:55 AM  Result Value Ref Range   Magnesium 2.5 (H) 1.7 - 2.4 mg/dL    Comment: Performed at [facility], [address] [place], [place] [address]  Phosphorus     Status: Abnormal   Collection Time: 01/15/20  3:55 AM  Result Value Ref Range   Phosphorus 5.0 (H) 2.5 - 4.6 mg/dL    Comment: Performed at [facility], [address], [place], [place] [address]  Glucose, capillary     Status: Abnormal   Collection Time: 01/15/20  4:09 AM  Result Value Ref Range   Glucose-Capillary 186 (H) 70 - 99 mg/dL    Comment: Glucose reference range applies only to samples taken after fasting for at least 8 hours.  Glucose, capillary     Status: Abnormal   Collection Time: 01/15/20  8:05 AM  Result Value Ref Range   Glucose-Capillary 171 (H) 70 - 99 mg/dL    Comment: Glucose reference range applies only to samples taken after fasting for at least 8 hours.  Glucose, capillary     Status: Abnormal   Collection Time: 01/15/20 11:56 AM  Result Value Ref Range   Glucose-Capillary 209 (H) 70 - 99 mg/dL    Comment: Glucose reference range applies only to samples taken after fasting for at least 8 hours.  Glucose, capillary     Status: Abnormal  Collection Time: 01/15/20  4:52 PM  Result Value Ref Range   Glucose-Capillary 149 (H) 70 - 99 mg/dL    Comment: Glucose reference range applies only to samples taken after fasting for at least 8 hours.  Glucose, capillary     Status: Abnormal   Collection Time: 01/15/20  7:42 PM  Result Value Ref Range   Glucose-Capillary 134 (H) 70 - 99 mg/dL    Comment: Glucose reference range applies only to samples taken after fasting for at least 8 hours.  Glucose, capillary     Status: Abnormal   Collection Time: 01/16/20 12:09 AM  Result Value Ref Range   Glucose-Capillary 120 (H) 70 - 99 mg/dL    Comment: Glucose reference range applies only to samples taken after fasting for at least 8 hours.  CBC with Differential/Platelet     Status: Abnormal   Collection Time: 01/16/20  2:54 AM  Result Value Ref Range   WBC 12.9 (H) 4.0 - 10.5 K/uL   RBC 4.40 3.87 - 5.11 MIL/uL   Hemoglobin  13.1 12.0 - 15.0 g/dL   HCT 23.5 36 - 46 %   MCV 91.1 80.0 - 100.0 fL   MCH 29.8 26.0 - 34.0 pg   MCHC 32.7 30.0 - 36.0 g/dL   RDW 36.1 44.3 - 15.4 %   Platelets 281 150 - 400 K/uL   nRBC 0.0 0.0 - 0.2 %   Neutrophils Relative % 65 %   Neutro Abs 8.5 (H) 1.7 - 7.7 K/uL   Lymphocytes Relative 18 %   Lymphs Abs 2.4 0.7 - 4.0 K/uL   Monocytes Relative 10 %   Monocytes Absolute 1.2 (H) 0.1 - 1.0 K/uL   Eosinophils Relative 3 %   Eosinophils Absolute 0.3 0.0 - 0.5 K/uL   Basophils Relative 1 %   Basophils Absolute 0.1 0.0 - 0.1 K/uL   Immature Granulocytes 3 %   Abs Immature Granulocytes 0.32 (H) 0.00 - 0.07 K/uL    Comment: Performed at [facility], [address] [place], [place] [address]  Comprehensive metabolic panel     Status: Abnormal   Collection Time: 01/16/20  2:54 AM  Result Value Ref Range   Sodium 139 135 - 145 mmol/L   Potassium 3.8 3.5 - 5.1 mmol/L   Chloride 102 98 - 111 mmol/L   CO2 26 22 - 32 mmol/L   Glucose, Bld 104 (H) 70 - 99 mg/dL    Comment: Glucose reference range applies only to samples taken after fasting for at least 8 hours.   BUN 73 (H) 8 - 23 mg/dL   Creatinine, Ser 6.19 (H) 0.44 - 1.00 mg/dL   Calcium 8.5 (L) 8.9 - 10.3 mg/dL   Total Protein 6.5 6.5 - 8.1 g/dL   Albumin 2.9 (L) 3.5 - 5.0 g/dL   AST 40 15 - 41 U/L   ALT 43 0 - 44 U/L   Alkaline Phosphatase 57 38 - 126 U/L   Total Bilirubin 0.5 0.3 - 1.2 mg/dL   GFR, Estimated 31 (L) >60 mL/min    Comment: (NOTE) Calculated using the CKD-EPI Creatinine Equation (2021)    Anion gap 11 5 - 15    Comment: Performed at [facility], [address], [place], [place] [address]  Glucose, capillary     Status: Abnormal   Collection Time: 01/16/20  3:59 AM  Result Value Ref Range   Glucose-Capillary 112 (H) 70 - 99 mg/dL    Comment:  Glucose reference range applies only to samples taken after fasting for at least 8 hours.  Glucose, capillary     Status: Abnormal   Collection Time:  01/16/20  8:50 AM  Result Value Ref Range   Glucose-Capillary 127 (H) 70 - 99 mg/dL    Comment: Glucose reference range applies only to samples taken after fasting for at least 8 hours.   No results found.   Medical Problem List and Plan: 1. 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  -patient may shower  -ELOS/Goals: 18-22 days/Supervision/Min A  Admit to CIR 2.  Antithrombotics: -DVT/anticoagulation:  Pharmaceutical: Heparin             -antiplatelet therapy: N/a 3. Pain Management: Tylenol as needed 4. Mood: LCSW to follow for evaluation and support when appropriate.             -antipsychotic agents: N/A 5. Neuropsych: This patient is not capable of making decisions on her own behalf. 6. Skin/Wound Care: Routine pressure-relief measures.  Maintain adequate nutrition and hydration status. 7. Fluids/Electrolytes/Nutrition: Monitor intake/output--will order calorie count as cortak removed today. Will need assistance with feeding as due to fluctuating bouts of lethargy.  8. HTN: Monitor BP TID--continue Lasix, Hydralazine, Metoprolol, HCTZ,    Monitor with increased mobility. 9. Acute on chronic renal failure: BUN/SCr 34/1.5 at admission -->73/1.61--Question due to multiple diuretics on board. Also has elevated phos/Mg levels-->repeat labs ordered.  Will start patient on IVFfor gentle hydration.  10. PNA?/Tracheobronchitis: Completed course of antibiotics on  11. Leukocytosis: Continue to monitor for signs of infection.   CBC ordered 12. T2DM with hyperglycemia:  Monitor BS ac/hs. Continue Lantus 50 units bid--d/c meal coverage as tube feeds d/c.   Monitor with increased mobility 13. Post stroke dysphagia :  D2 thins, advance diet as tolerated.  Jacquelynn Cree,  PA-C 01/16/2020  I have personally performed a face to face diagnostic evaluation, including, but not limited to relevant history and physical exam findings, of this patient and developed relevant assessment and plan.  Additionally, I have reviewed and concur with the physician assistant's documentation above.  Maryla Morrow, MD, ABPMR  The patient's status has not changed. Any changes from the pre-admission screening or documentation from the acute chart are noted above.   Maryla Morrow, MD, ABPMR

## 2020-01-16 NOTE — Progress Notes (Signed)
Physical Therapy Treatment Patient Details Name: [name] [name] MRN: [id number] DOB: [birth year] Today's Date: 01/16/2020    History of Present Illness The pt is an 86 yo female presenting with acute onset aphasia and R-sided weakness. Imaging revealed L basal ganglia/thalamic hemorrhage with 49mm midline shift and no intraventricular extension. The pt was intubated 11/7 - 11/11. PMH includes: DM II, HTN, CHF, and colon polyps. pt extubated 11/9.    PT Comments    Pt is making slow but steady progress towards her goals. In support, she demonstrated an improved ability to find and maintain midline posture in sitting and static standing this date compared to her previous session. However, she required modAx2 to come to sit from supine and to come to stand from EOB with use of a stedy and assistance from a rehab tech due to the pt's lack of strength, coordination, and balance and her reported lethargy from not sleeping well last night. Pt's attention wavered more this date, most likely from poor sleep last night. After transferring to the recliner, focused remainder of session on leg muscular strengthening exercises, with improved quad activation noted but minimal hip flexor, abductor, and adductor activation noted on the R. Will continue to follow acutely. Current recommendations remain appropriate.   Follow Up Recommendations  CIR;Supervision/Assistance - 24 hour     Equipment Recommendations  Other (comment) (defer to next venue)    Recommendations for Other Services Rehab consult     Precautions / Restrictions Precautions Precautions: Fall Precaution Comments: R-sided hemiparesis Restrictions Weight Bearing Restrictions: No    Mobility  Bed Mobility Overal bed mobility: Needs Assistance Bed Mobility: Supine to Sit     Supine to sit: HOB elevated;+2 for physical assistance;Mod assist     General bed mobility comments: Slow but steady movement of legs towards L EOB when cued,  with improved activation noted in R leg this date. Cued pt to place L hand on bed rail and push up to ascend trunk. ModAx2 to manage legs and trunk to complete and square hips with EOB.  Transfers Overall transfer level: Needs assistance Equipment used: Ambulation equipment used Transfers: Sit to/from [employer] Sit to Stand: Mod assist;+2 physical assistance (from bed) Stand pivot transfers: Total assist;+2 safety/equipment       General transfer comment: Provided cues to lift legs to place on stedy platform, with assistance at R leg to clear platform. Hand-over-hand placement of R hand on bar of stedy and cues for L hand placement. Verbal and tactile cues provided at R quad to extend knee to come to stand. Pt tends to lean to R and required cues to find and maintain midline. ModAx2 to come to stand from bed but minAx2 to come to stand from stedy.  Ambulation/Gait             General Gait Details: unable to step feet   Stairs             Wheelchair Mobility    Modified Rankin (Stroke Patients Only) Modified Rankin (Stroke Patients Only) Pre-Morbid Rankin Score: No symptoms Modified Rankin: Severe disability     Balance Overall balance assessment: Needs assistance Sitting-balance support: Feet supported;Bilateral upper extremity supported Sitting balance-Leahy Scale: Poor Sitting balance - Comments: Trunk sway noted with intermitent posterior LOB, resulting in pt requiring cues to find and maintain upright midline and intermittent minA to recover balance. Otherwise, min guard assist for safety. Postural control: Right lateral lean;Posterior lean Standing balance support: Bilateral upper extremity supported  Standing balance-Leahy Scale: Poor Standing balance comment: Static standing x2 bouts of ~1-2 min duration each, providing tactile and verbal cues to extend hips and R knee while looking anteriorly. Pt tends to lean to R and thus requires cues to  find and maintain midline, with improved success this date compared to last session.                            Cognition Arousal/Alertness: Lethargic Behavior During Therapy: WFL for tasks assessed/performed Overall Cognitive Status: Impaired/Different from baseline Area of Impairment: Attention;Memory;Safety/judgement;Awareness;Problem solving;Following commands                   Current Attention Level: Sustained Memory: Decreased short-term memory Following Commands: Follows one step commands consistently;Follows one step commands with increased time Safety/Judgement: Decreased awareness of safety;Decreased awareness of deficits Awareness: Emergent Problem Solving: Decreased initiation;Slow processing;Requires verbal cues;Requires tactile cues General Comments: Pt lethargic and falling asleep on occasion. Difficulty with concentrating on task, thereby requiring multiple cues and extra time to respond.       Exercises General Exercises - Lower Extremity Long Arc Quad: Strengthening;Both;10 reps;Seated Hip ABduction/ADduction: AAROM;Both;10 reps;Seated (AAROM at R leg and AROM at L) Hip Flexion/Marching: Both;Seated;AAROM;5 reps (10 reps L AROM, 5 reps R AAROM)    General Comments        Pertinent Vitals/Pain Pain Assessment: Faces Faces Pain Scale: No hurt Pain Intervention(s): Limited activity within patient's tolerance;Monitored during session;Repositioned    Home Living                      Prior Function            PT Goals (current goals can now be found in the care plan section) Acute Rehab PT Goals Patient Stated Goal: to get a bath and to get to recliner PT Goal Formulation: With patient/family Time For Goal Achievement: 01/19/20 Potential to Achieve Goals: Good Progress towards PT goals: Progressing toward goals    Frequency    Min 4X/week      PT Plan Current plan remains appropriate    Co-evaluation               AM-PAC PT "6 Clicks" Mobility   Outcome Measure  Help needed turning from your back to your side while in a flat bed without using bedrails?: A Lot Help needed moving from lying on your back to sitting on the side of a flat bed without using bedrails?: A Lot Help needed moving to and from a bed to a chair (including a wheelchair)?: Total Help needed standing up from a chair using your arms (e.g., wheelchair or bedside chair)?: A Lot Help needed to walk in hospital room?: Total Help needed climbing 3-5 steps with a railing? : Total 6 Click Score: 9    End of Session Equipment Utilized During Treatment: Gait belt Activity Tolerance: Patient tolerated treatment well Patient left: in chair;with call bell/phone within reach;with chair alarm set;with family/visitor present (granddaughter present) Nurse Communication: Mobility status;Need for lift equipment;Other (comment) (desire for bath) PT Visit Diagnosis: Other abnormalities of gait and mobility (R26.89);Muscle weakness (generalized) (M62.81);Hemiplegia and hemiparesis;Unsteadiness on feet (R26.81);Difficulty in walking, not elsewhere classified (R26.2);Other symptoms and signs involving the nervous system (R29.898) Hemiplegia - Right/Left: Right Hemiplegia - dominant/non-dominant: Dominant Hemiplegia - caused by: Other cerebrovascular disease     Time: 1100-1132 PT Time Calculation (min) (ACUTE ONLY): 32 min  Charges:  $Therapeutic Exercise:  8-22 mins $Therapeutic Activity: 8-22 mins                     Raymond Gurney, PT, DPT Acute Rehabilitation Services  Pager: [phone] Office: [phone]    Jewel Baize 01/16/2020, 12:23 PM

## 2020-01-16 NOTE — Progress Notes (Signed)
Inpatient Rehab Admissions Coordinator:   I have a bed available for pt to admit to CIR today. Dr. Tyson Babinski in agreement.  I will let TOC team, pt, and family know.   Estill Dooms, PT, DPT Admissions Coordinator [phone] 01/16/20  10:27 AM

## 2020-01-17 ENCOUNTER — Inpatient Hospital Stay (HOSPITAL_COMMUNITY): Payer: Medicare Other

## 2020-01-17 ENCOUNTER — Inpatient Hospital Stay (HOSPITAL_COMMUNITY): Payer: Medicare Other | Admitting: Speech Pathology

## 2020-01-17 ENCOUNTER — Encounter (HOSPITAL_COMMUNITY): Payer: Medicare Other

## 2020-01-17 DIAGNOSIS — I619 Nontraumatic intracerebral hemorrhage, unspecified: Secondary | ICD-10-CM | POA: Diagnosis not present

## 2020-01-17 LAB — COMPREHENSIVE METABOLIC PANEL
ALT: 41 U/L (ref 0–44)
AST: 49 U/L — ABNORMAL HIGH (ref 15–41)
Albumin: 2.7 g/dL — ABNORMAL LOW (ref 3.5–5.0)
Alkaline Phosphatase: 50 U/L (ref 38–126)
Anion gap: 11 (ref 5–15)
BUN: 71 mg/dL — ABNORMAL HIGH (ref 8–23)
CO2: 27 mmol/L (ref 22–32)
Calcium: 8.4 mg/dL — ABNORMAL LOW (ref 8.9–10.3)
Chloride: 101 mmol/L (ref 98–111)
Creatinine, Ser: 1.6 mg/dL — ABNORMAL HIGH (ref 0.44–1.00)
GFR, Estimated: 31 mL/min — ABNORMAL LOW (ref >60)
Glucose, Bld: 164 mg/dL — ABNORMAL HIGH (ref 70–99)
Potassium: 4 mmol/L (ref 3.5–5.1)
Sodium: 139 mmol/L (ref 135–145)
Total Bilirubin: 0.6 mg/dL (ref 0.3–1.2)
Total Protein: 6.2 g/dL — ABNORMAL LOW (ref 6.5–8.1)

## 2020-01-17 LAB — CBC WITH DIFFERENTIAL/PLATELET
Abs Immature Granulocytes: 0.18 10*3/uL — ABNORMAL HIGH (ref 0.00–0.07)
Basophils Absolute: 0.1 10*3/uL (ref 0.0–0.1)
Basophils Relative: 1 %
Eosinophils Absolute: 0.2 10*3/uL (ref 0.0–0.5)
Eosinophils Relative: 2 %
HCT: 38.3 % (ref 36.0–46.0)
Hemoglobin: 12.2 g/dL (ref 12.0–15.0)
Immature Granulocytes: 2 %
Lymphocytes Relative: 15 %
Lymphs Abs: 1.6 10*3/uL (ref 0.7–4.0)
MCH: 29.8 pg (ref 26.0–34.0)
MCHC: 31.9 g/dL (ref 30.0–36.0)
MCV: 93.6 fL (ref 80.0–100.0)
Monocytes Absolute: 0.9 10*3/uL (ref 0.1–1.0)
Monocytes Relative: 8 %
Neutro Abs: 8.1 10*3/uL — ABNORMAL HIGH (ref 1.7–7.7)
Neutrophils Relative %: 72 %
Platelets: 252 10*3/uL (ref 150–400)
RBC: 4.09 MIL/uL (ref 3.87–5.11)
RDW: 14.6 % (ref 11.5–15.5)
WBC: 11 10*3/uL — ABNORMAL HIGH (ref 4.0–10.5)
nRBC: 0 % (ref 0.0–0.2)

## 2020-01-17 LAB — GLUCOSE, CAPILLARY
Glucose-Capillary: 98 mg/dL (ref 70–99)
Glucose-Capillary: 134 mg/dL — ABNORMAL HIGH (ref 70–99)
Glucose-Capillary: 174 mg/dL — ABNORMAL HIGH (ref 70–99)
Glucose-Capillary: 131 mg/dL — ABNORMAL HIGH (ref 70–99)

## 2020-01-17 NOTE — Progress Notes (Addendum)
[place] PHYSICAL MEDICINE & REHABILITATION PROGRESS NOTE   Subjective/Complaints:  Patient seen in gym.  She is in the parallel bars and took some steps.  Her son is watching her. No complaints today Review of systems no shortness of breath or coughing no abdominal pain or nausea  Objective:   No results found. Recent Labs    01/16/20 0254 01/17/20 0121  WBC 12.9* 11.0*  HGB 13.1 12.2  HCT 40.1 38.3  PLT 281 252   Recent Labs    01/16/20 0254 01/17/20 0121  NA 139 139  K 3.8 4.0  CL 102 101  CO2 26 27  GLUCOSE 104* 164*  BUN 73* 71*  CREATININE 1.61* 1.60*  CALCIUM 8.5* 8.4*    Intake/Output Summary (Last 24 hours) at 01/17/2020 1105 Last data filed at 01/17/2020 0900 Gross per 24 hour  Intake 320 ml  Output --  Net 320 ml        Physical Exam: Vital Signs Blood pressure (!) 132/49, pulse 66, temperature (!) 97.5 F (36.4 C), temperature source Oral, resp. rate 18, height 5\' 3"  (1.6 m), weight 101.3 kg, SpO2 96 %.   General: No acute distress Mood and affect are appropriate Heart: Regular rate and rhythm no rubs murmurs or extra sounds Lungs: Clear to auscultation, breathing unlabored, no rales or wheezes Abdomen: Positive bowel sounds, soft nontender to palpation, nondistended Extremities: No clubbing, cyanosis, or edema Skin: No evidence of breakdown, no evidence of rash Neurologic: Cranial nerves II through XII intact, motor strength is 5/5 in Left 0/5 RIgh t  deltoid, bicep, tricep, grip,5/5 left and 4/5 RIght  hip flexor, knee extensors, ankle dorsiflexor and plantar flexor Sensory exam normal sensation to light touch and proprioception in bilateral upper and lower extremities Cerebellar exam normal finger to nose to finger as well as heel to shin in bilateral upper and lower extremities Musculoskeletal: Full range of motion in all 4 extremities. No joint swelling  Assessment/Plan: 1. Functional deficits which require 3+ hours per day of  interdisciplinary therapy in a comprehensive inpatient rehab setting.  Physiatrist is providing close team supervision and 24 hour management of active medical problems listed below.  Physiatrist and rehab team continue to assess barriers to discharge/monitor patient progress toward functional and medical goals  Care Tool:  Bathing              Bathing assist       Upper Body Dressing/Undressing Upper body dressing   What is the patient wearing?: Hospital gown only    Upper body assist      Lower Body Dressing/Undressing Lower body dressing      What is the patient wearing?: Incontinence brief     Lower body assist Assist for lower body dressing: 2 Helpers     Toileting Toileting    Toileting assist Assist for toileting: 2 Helpers     Transfers Chair/bed transfer  Transfers assist           Locomotion Ambulation   Ambulation assist              Walk 10 feet activity   Assist           Walk 50 feet activity   Assist           Walk 150 feet activity   Assist           Walk 10 feet on uneven surface  activity   Assist  Wheelchair     Assist               Wheelchair 50 feet with 2 turns activity    Assist            Wheelchair 150 feet activity     Assist          Blood pressure (!) 132/49, pulse 66, temperature (!) 97.5 F (36.4 C), temperature source Oral, resp. rate 18, height 5\' 3"  (1.6 m), weight 101.3 kg, SpO2 96 %.  Medical Problem List and Plan: 1. 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             -patient may shower, health care POA wants full code             -ELOS/Goals: 18-22 days/Supervision/Min A CIR PT< OT, SLP 2.  Antithrombotics: -DVT/anticoagulation:Pharmaceutical:Heparin -antiplatelet therapy: N/a 3. Pain Management:Tylenol as needed 4. Mood:LCSW to follow for evaluation and support when appropriate. -antipsychotic agents: N/A 5. Neuropsych: This patientis notcapable of making decisions on herown behalf. 6. Skin/Wound Care:Routine pressure-relief measures. Maintain adequate nutrition and hydration status. 7. Fluids/Electrolytes/Nutrition:Monitor intake/output--will order calorie count as cortak removed today. Will need assistance with feeding as due to fluctuating bouts of lethargy.  8. HTN: Monitor BP TID--continue Lasix, Hydralazine, Metoprolol, HCTZ,              Vitals:   01/16/20 1945 01/17/20 0457  BP: (!) 139/44 (!) 132/49  Pulse: 62 66  Resp: 18 18  Temp: 97.6 F (36.4 C) (!) 97.5 F (36.4 C)  SpO2: 98% 96%  controlled 11/20 9. Acute on chronic renal failure: BUN/SCr 34/1.5 at admission -->73/1.61--Question due to multiple diuretics on board. Also has elevated phos/Mg levels-->repeat labs ordered.  Will start patient on IVFfor gentle hydration.  10. PNA?/Tracheobronchitis: Completed course of antibiotics on  11. Leukocytosis: Continue to monitor for signs of infection.              CBC ordered 12. T2DM with hyperglycemia:  Monitor BS ac/hs. Continue Lantus 50 units bid--d/c meal coverage as tube feeds d/c.    CBG (last 3)  Recent Labs    01/16/20 1657 01/16/20 2123 01/17/20 0618  GLUCAP 75 132* 98  CBGs controlled  13. Post stroke dysphagia :             D2 thins, advance diet as tolerated.     LOS: 1 days A FACE TO FACE EVALUATION WAS PERFORMED  01/19/20 01/17/2020, 11:05 AM

## 2020-01-17 NOTE — Evaluation (Signed)
Physical Therapy Assessment and Plan  Patient Details  Name: [name] [name] MRN: [id number] Date of Birth: 09-Jul-1932  PT Diagnosis: Abnormal posture, Abnormality of gait, Ataxia, Cognitive deficits, Coordination disorder, Difficulty walking, Hemiparesis dominant, Hypertonia, Hypotonia, Impaired cognition, Impaired sensation and Muscle weakness Rehab Potential: Good ELOS: ~3 weeks   Today's Date: 01/17/2020 PT Individual Time: 9211-9417 PT Individual Time Calculation (min): 72 min    Hospital Problem: Principal Problem:   Nontraumatic acute hemorrhage of basal ganglia ([place])   Past Medical History:  Past Medical History:  Diagnosis Date  . Colon polyp   . Diabetes mellitus 1992  . Hypertension 1996  . Pancreatitis    Past Surgical History:  Past Surgical History:  Procedure Laterality Date  . CHOLECYSTECTOMY    . COLONOSCOPY    . ERCP    . PARTIAL HYSTERECTOMY  86years old  . POLYPECTOMY    . UPPER GASTROINTESTINAL ENDOSCOPY    . WRIST FRACTURE SURGERY  left arm,86years old    Assessment & Plan Clinical Impression: Patient is a 86 y.o. year old female withhistory of T2DM, CKD (Dr. Lorrene Reid) HTN, CHF,morbid obesity--BMI 41;who was admitted on 01/04/2020 after collapsing at church during her service. History taken from chart review due to aphasia. On admission she was found to have right hemiparesis with dysarthria.  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 She was started on Cleviprex and labetalol for BP control and required intubation for airway protection due to progressive decline in LOC. Acute hypoxic/hypercapnic respiratory failure felt to be due tohemorrhagic stroke was able to tolerate extubation on 01/06/2020.Follow-up MRI/MRI brain showed no visible mass or vascular lesion, extensive small vessel ischemia and acute left thalamic bleed without significant  change. Carotid Dopplers were negative for significant ICA stenosi.Echocardiogram with EF of 55-60% with no wal abnormality and moderate mitral annular calcification. Tube feeds initiated for nutritional support.IV abx added on 01/08/2020 due to ongoing lethargy as well as low-grade fevers with leukocytosiswith WBC-15.3. Blood cultures x2 from11/10 were negative for growth.  On 01/13/2020,she was noted to have change in mental status with significant somnolence and inability to follow any commands.Repeat CT head showed subacute left thalamic hematoma without acute or interval findings. Dr. Malen Gauze question seizures as cause for mental status change and EEG ordered which was negative for seizures or epileptiform discharges. Her mentation is slowly improving with increase in verbal output and diet has been advanced to dysphagia #2, thin liquids. She has had steady worsening of renal status with recurrent leucocytosis. Mentation continues to fluctuate. Cortak removed today. Therapy cotreatment ongoing with patient showing ability to activate RLE and stand in steady. She continues to have limitations due to right hemiparesis with inattention, cognitive deficits decreased initiation and delayed processing as well as expressive deficits affecting ability to carry out ADLs and mobility. CIR was recommended due to functional decline. Please see preadmission assessment from earlier today as well.    Patient currently requires max with mobility secondary to muscle weakness, decreased cardiorespiratoy endurance, impaired timing and sequencing, abnormal tone, unbalanced muscle activation, motor apraxia, decreased coordination and decreased motor planning, decreased visual perceptual skills, decreased midline orientation, decreased attention to right and decreased motor planning, decreased initiation, decreased attention, decreased awareness, decreased problem solving, decreased safety awareness, decreased  memory and delayed processing and decreased sitting balance, decreased standing balance, decreased postural control, hemiplegia and decreased balance strategies.  Prior  to hospitalization, patient was modified independent  with mobility and lived with Alone in a House home.  Home access is  Ramped entrance.  Patient will benefit from skilled PT intervention to maximize safe functional mobility, minimize fall risk and decrease caregiver burden for planned discharge home with 24 hour supervision and assist.  Anticipate patient will need HH vs OP  at discharge.      PT Evaluation Precautions/Restrictions Precautions Precautions: Fall Precaution Comments: R-sided hemiparesis, aphasia Restrictions Weight Bearing Restrictions: No Home Living/Prior Functioning Home Living Available Help at Discharge: Family;Available PRN/intermittently Type of Home: House Home Access: Ramped entrance Home Layout: One level Bathroom Shower/Tub: Multimedia programmer: Handicapped height Bathroom Accessibility: Yes Additional Comments: Pt unable to provide home information at this time, no family present. Will continue to assess.  Lives With: Alone Prior Function Level of Independence: Independent with basic ADLs;Independent with gait;Independent with homemaking with ambulation;Independent with transfers  Able to Take Stairs?: Yes Driving: Yes Comments: owns a rollator and a hurry-cane Vision/Perception  Vision - Assessment Additional Comments: difficulty attending to visual stimuli on R Perception Perception: Impaired Inattention/Neglect: Does not attend to right visual field;Does not attend to right side of body Praxis Praxis: Impaired Praxis Impairment Details: Initiation;Motor planning  Cognition Overall Cognitive Status: Impaired/Different from baseline Arousal/Alertness: Lethargic Orientation Level: Oriented to person Sustained Attention: Impaired Memory: Impaired Awareness:  Impaired Problem Solving: Impaired Safety/Judgment: Impaired Sensation Sensation Light Touch: Impaired Detail Central sensation comments: Patient reports "tingly feeling" in distal R LE, did not report sensation in R UE Light Touch Impaired Details: Impaired RLE;Absent RUE Hot/Cold: Not tested Proprioception: Impaired Detail Proprioception Impaired Details: Impaired RLE (0/5 at R hallux, 1/5 at ankle) Coordination Gross Motor Movements are Fluid and Coordinated: No Fine Motor Movements are Fluid and Coordinated: No Coordination and Movement Description: R hemiparesis Finger Nose Finger Test: unable RUE Heel Shin Test: dysmetric and limited R LE Motor  Motor Motor: Hemiplegia;Abnormal tone;Abnormal postural alignment and control;Motor apraxia;Ataxia Motor - Skilled Clinical Observations: R hemiparesis with R lateral lean in sitting with L UE pushing   Trunk/Postural Assessment  Cervical Assessment Cervical Assessment: Exceptions to [facility] (forward head) Thoracic Assessment Thoracic Assessment: Exceptions to [facility] (increased kyphosis) Lumbar Assessment Lumbar Assessment: Exceptions to [facility] (posterior pelvic tilt) Postural Control Postural Control: Deficits on evaluation Head Control: decreases with fatigue Trunk Control: persistent R lateral lean in sitting Righting Reactions: delayed and inadequate Protective Responses: delayed and inadequate  Balance Balance Balance Assessed: Yes Standardized Balance Assessment Standardized Balance Assessment:  (FIST: 13/56) Static Sitting Balance Static Sitting - Balance Support: Feet supported Static Sitting - Level of Assistance: 3: Mod assist Dynamic Sitting Balance Dynamic Sitting - Balance Support: Feet supported;During functional activity Dynamic Sitting - Level of Assistance: 2: Max assist Dynamic Sitting - Balance Activities: Lateral lean/weight shifting;Forward lean/weight shifting Static Standing Balance Static Standing - Balance  Support: No upper extremity supported Static Standing - Level of Assistance: 2: Max assist Dynamic Standing Balance Dynamic Standing - Balance Support: During functional activity Dynamic Standing - Level of Assistance: 1: +2 Total assist Extremity Assessment      RLE Assessment RLE Assessment: Exceptions to [facility] Passive Range of Motion (PROM) Comments: limited knee flexion due to OA per son RLE Strength Right Hip Flexion: 2-/5 Right Hip ABduction: 2/5 Right Hip ADduction: 2/5 Right Knee Flexion: 1/5 Right Knee Extension: 3-/5 Right Ankle Dorsiflexion: 2/5 Right Ankle Plantar Flexion: 2/5 RLE Tone RLE Tone: Hypertonic;Modified Ashworth Body Part - Modified Ashworth Scale: Hamstrings;Quadriceps Hamstrings -  Modified Ashworth Scale for Grading Hypertonia RLE: Slight increase in muscle tone, manifested by a catch and release or by minimal resistance at the end of the range of motion when the affected part(s) is moved in flexion or extension QUADRICEPS - Modified Ashworth Scale for Grading Hypertonia RLE: Slight increase in muscle tone, manifested by a catch, followed by minimal resistance throughout the remainder (less than half) of the ROM LLE Assessment LLE Assessment: Exceptions to [facility] LLE Strength Left Hip Flexion: 3+/5 Left Hip ABduction: 4-/5 Left Hip ADduction: 4-/5 Left Knee Flexion: 3+/5 Left Knee Extension: 4-/5 Left Ankle Dorsiflexion: 3+/5 Left Ankle Plantar Flexion: 3+/5  Care Tool Care Tool Bed Mobility Roll left and right activity   Roll left and right assist level: Minimal Assistance - Patient > 75%    Sit to lying activity   Sit to lying assist level: Maximal Assistance - Patient 25 - 49%    Lying to sitting edge of bed activity   Lying to sitting edge of bed assist level: Maximal Assistance - Patient 25 - 49%     Care Tool Transfers Sit to stand transfer   Sit to stand assist level: Maximal Assistance - Patient 25 - 49%    Chair/bed transfer    Chair/bed transfer assist level: 2 Armed forces training and education officer transfer activity did not occur: Safety/medical concerns (due patient fatigue/weakness)      Scientist, product/process development transfer activity did not occur: Safety/medical concerns (due patient fatigue/weakness)        Care Tool Locomotion Ambulation Ambulation activity did not occur: Safety/medical concerns Assist level: 2 helpers Assistive device: Parallel bars Max distance: 8  Walk 10 feet activity Walk 10 feet activity did not occur: Safety/medical concerns (due patient fatigue/weakness)       Walk 50 feet with 2 turns activity Walk 50 feet with 2 turns activity did not occur: Safety/medical concerns (due patient fatigue/weakness)      Walk 150 feet activity Walk 150 feet activity did not occur: Safety/medical concerns (due patient fatigue/weakness)      Walk 10 feet on uneven surfaces activity Walk 10 feet on uneven surfaces activity did not occur: Safety/medical concerns (due patient fatigue/weakness)      Stairs Stair activity did not occur: Safety/medical concerns (due patient fatigue/weakness)        Walk up/down 1 step activity Walk up/down 1 step or curb (drop down) activity did not occur: Safety/medical concerns (due patient fatigue/weakness)        Walk up/down 4 steps activity      Walk up/down 12 steps activity Walk up/down 12 steps activity did not occur: Safety/medical concerns (due patient fatigue/weakness)      Pick up small objects from floor Pick up small object from the floor (from standing position) activity did not occur: Safety/medical concerns (due patient fatigue/weakness)      Wheelchair Will patient use wheelchair at discharge?: Yes Type of Wheelchair: Manual   Wheelchair assist level: Maximal Assistance - Patient 25 - 49% Max wheelchair distance: 25  Wheel 50 feet with 2 turns activity   Assist Level: Total Assistance - Patient < 25%  Wheel 150 feet activity   Assist Level: Total  Assistance - Patient < 25%    Refer to Care Plan for Long Term Goals  SHORT TERM GOAL WEEK 1 PT Short Term Goal 1 (Week 1): Patient will roll B with no more than CGA and use of bed rails as needed PT Short Term Goal  2 (Week 1): Patient will transition supine > sit with no more than ModA x1 consistently PT Short Term Goal 3 (Week 1): Patient will transfer bed <> wc with no more than MaxA x1 at least 50% of the time with LRAD PT Short Term Goal 4 (Week 1): Patient will ambulate >39f with +2 assist and LRAD  Recommendations for other services: Neuropsych and Therapeutic Recreation  Stress management  Skilled Therapeutic Intervention Mobility Bed Mobility Bed Mobility: Rolling Right;Rolling Left;Supine to Sit;Sit to Supine;Sitting - Scoot to Edge of Bed Rolling Right: Minimal Assistance - Patient > 75% Rolling Left: Minimal Assistance - Patient > 75% Supine to Sit: Maximal Assistance - Patient - Patient 25-49% Sitting - Scoot to Edge of Bed: Maximal Assistance - Patient 25-49% Sit to Supine: Maximal Assistance - Patient 25-49% Transfers Transfers: Sit to Stand;Stand to Sit;Squat Pivot Transfers Sit to Stand: Maximal Assistance - Patient 25-49% Stand to Sit: Moderate Assistance - Patient 50-74% Squat Pivot Transfers: 2 Helpers Transfer (Assistive device): 2 person hand held assist Locomotion  Gait Ambulation: Yes Gait Assistance: 2 Helpers Gait Distance (Feet): 8 Feet Assistive device: Parallel bars Gait Assistance Details: Tactile cues for initiation;Tactile cues for sequencing;Tactile cues for weight shifting;Tactile cues for posture;Verbal cues for precautions/safety;Verbal cues for technique;Verbal cues for sequencing;Tactile cues for placement;Tactile cues for weight beaing;Manual facilitation for placement;Manual facilitation for weight shifting;Verbal cues for gait pattern Gait Assistance Details: wc follow within // bars Gait Gait: Yes Gait Pattern: Impaired Gait Pattern:  Decreased step length - left;Step-to pattern;Decreased stance time - right;Decreased hip/knee flexion - right;Decreased weight shift to right;Decreased dorsiflexion - right;Right genu recurvatum;Lateral trunk lean to right;Trunk flexed;Narrow base of support;Trendelenburg;Right foot flat;Poor foot clearance - right Gait velocity: decreased Stairs / Additional Locomotion Stairs: No Wheelchair Mobility Wheelchair Mobility: Yes Wheelchair Assistance: Maximal Assistance - Patient 25 - 49% Wheelchair Propulsion: Both upper extremities Wheelchair Parts Management: Needs assistance Distance: 25  Patient received supine in bed, son at bedside, agreeable to PT eval. She remained very lethargic throughout eval requiring consistent stimulation to remain awake to participate in eval. Son clarifying prior level of function, home layout and level of assist available at dc. Patient with limited insight into deficits at this time. She also pushes significantly with L UE in sitting complicating transfers and bed mobility. At end of eval, patient returning to room in wc, seatbelt alarm on, call light within reach, son at bedside.   Discharge Criteria: Patient will be discharged from PT if patient refuses treatment 3 consecutive times without medical reason, if treatment goals not met, if there is a change in medical status, if patient makes no progress towards goals or if patient is discharged from hospital.  The above assessment, treatment plan, treatment alternatives and goals were discussed and mutually agreed upon: by patient and by family  JDebbora Dus11/20/2021, 7:39 AM

## 2020-01-17 NOTE — Plan of Care (Signed)
  Problem: RH BOWEL ELIMINATION Goal: RH STG MANAGE BOWEL WITH ASSISTANCE Description: STG Manage Bowel with Assistance. Outcome: Not Progressing; incontinence   Problem: RH BLADDER ELIMINATION Goal: RH STG MANAGE BLADDER WITH ASSISTANCE Description: STG Manage Bladder With Assistance Outcome: Not Progressing; incontinence   

## 2020-01-17 NOTE — Plan of Care (Signed)
  Problem: RH BOWEL ELIMINATION Goal: RH STG MANAGE BOWEL WITH ASSISTANCE Description: STG Manage Bowel with Assistance. 01/17/2020 1317 by Pierre Bali, RN Outcome: Not Progressing;incontinence   Problem: RH BLADDER ELIMINATION Goal: RH STG MANAGE BLADDER WITH ASSISTANCE Description: STG Manage Bladder With Assistance 01/17/2020 1320 by Pierre Bali, RN Outcome: Not Progressing 01/17/2020 1317 by Pierre Bali, RN Outcome: Not Progressing;incontinence

## 2020-01-17 NOTE — Evaluation (Signed)
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 Colon polyp   . Diabetes mellitus 1992  . Hypertension 1996  . Pancreatitis    Past Surgical History:  Past Surgical History:  Procedure Laterality Date  . CHOLECYSTECTOMY    . COLONOSCOPY    . ERCP    . PARTIAL HYSTERECTOMY  86years old  . POLYPECTOMY    . UPPER GASTROINTESTINAL ENDOSCOPY    . WRIST FRACTURE SURGERY  left arm,86years old    Assessment & Plan Clinical Impression: Patient is a 86 y.o. year old female withhistory of T2DM, CKD (Dr. Lorrene Reid) HTN, CHF,morbid obesity--BMI 41;who was admitted on 01/04/2020 after collapsing at church during her service. History taken from chart review due to aphasia. On admission she was found to have right hemiparesis with dysarthria. 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 She was started on Cleviprex and labetalol for BP control and required intubation for airway protection due to progressive decline in LOC. Acute hypoxic/hypercapnic respiratory failure felt to be due tohemorrhagic stroke was able to tolerate extubation on 01/06/2020.Follow-up MRI/MRI brain showed no visible mass or vascular lesion, extensive small vessel ischemia and acute left thalamic bleed without significant change. Carotid Dopplers were negative for significant ICA  stenosi.Echocardiogram with EF of 55-60% with no wal abnormality and moderate mitral annular calcification. Tube feeds initiated for nutritional support.IV abx added on 01/08/2020 due to ongoing lethargy as well as low-grade fevers with leukocytosiswith WBC-15.3. Blood cultures x2 from11/10 were negative for growth.  On 01/13/2020,she was noted to have change in mental status with significant somnolence and inability to follow any commands.Repeat CT head showed subacute left thalamic hematoma without acute or interval findings. Dr. Malen Gauze question seizures as cause for mental status change and EEG ordered which was negative for seizures or epileptiform discharges. Her mentation is slowly improving with increase in verbal output and diet has been advanced to dysphagia #2, thin liquids. She has had steady worsening of renal status with recurrent leucocytosis. Mentation continues to fluctuate. Cortak removed today. Therapy cotreatment ongoing with patient showing ability to activate RLE and stand in steady. She continues to have limitations due to right hemiparesis with inattention, cognitive deficits decreased initiation and delayed processing as well as expressive deficits affecting ability to carry out ADLs and mobility. CIR was recommended due to functional decline. Please see preadmission assessment from earlier today as well.   Patient currently requires max with mobility secondary to muscle weakness, decreased cardiorespiratoy endurance, impaired timing and sequencing, abnormal tone, unbalanced muscle activation, motor apraxia, decreased coordination and decreased motor planning, decreased visual perceptual skills, decreased midline orientation, decreased attention to right and decreased motor planning, decreased initiation, decreased attention, decreased awareness, decreased problem solving, decreased safety awareness, decreased memory and delayed processing and decreased sitting balance,  decreased standing balance, decreased postural control, hemiplegia and decreased balance strategies.  Prior to hospitalization, patient was modified independent  with  mobility and lived with Alone in a House home.  Home access is  Ramped entrance.  Patient currently requires max-total assist with basic self-care skills secondary to muscle weakness, muscle joint tightness and muscle paralysis, decreased cardiorespiratoy endurance, impaired timing and sequencing, abnormal tone, unbalanced muscle activation, motor apraxia, ataxia, decreased coordination and decreased motor planning, decreased visual perceptual skills, decreased attention to right and decreased motor planning, decreased attention, decreased awareness, decreased problem solving, decreased safety awareness, decreased memory and delayed processing and decreased sitting balance, decreased standing balance, decreased postural control, hemiplegia and decreased balance strategies.  Prior to hospitalization, patient could complete ADLs with modified independent .  Patient will benefit from skilled intervention to increase independence with basic self-care skills prior to discharge home.  Anticipate patient will require 24 hour supervision and minimal physical assistance and follow up home health.  OT - End of Session Activity Tolerance: Decreased this session Endurance Deficit: Yes OT Assessment Rehab Potential (ACUTE ONLY): Good OT Patient demonstrates impairments in the following area(s): Balance;Safety;Behavior;Sensory;Cognition;Skin Integrity;Vision;Edema;Endurance;Motor;Nutrition;Pain;Perception OT Basic ADL's Functional Problem(s): Grooming;Eating;Bathing;Dressing;Toileting OT Transfers Functional Problem(s): Toilet;Tub/Shower OT Additional Impairment(s): Fuctional Use of Upper Extremity OT Plan OT Intensity: Minimum of 1-2 x/day, 45 to 90 minutes OT Frequency: 5 out of 7 days OT Duration/Estimated Length of Stay: 18-22 days OT  Treatment/Interventions: Balance/vestibular training;Disease mangement/prevention;Neuromuscular re-education;Self Care/advanced ADL retraining;Therapeutic Exercise;Wheelchair propulsion/positioning;Cognitive remediation/compensation;DME/adaptive equipment instruction;Pain management;Skin care/wound managment;UE/LE Strength taining/ROM;Community reintegration;Functional electrical stimulation;Patient/family education;Splinting/orthotics;UE/LE Coordination activities;Discharge planning;Functional mobility training;Psychosocial support;Therapeutic Activities;Visual/perceptual remediation/compensation OT Self Feeding Anticipated Outcome(s): supervision OT Basic Self-Care Anticipated Outcome(s): min-supervision OT Toileting Anticipated Outcome(s): min assist OT Bathroom Transfers Anticipated Outcome(s): min assist OT Recommendation Patient destination: Home Follow Up Recommendations: Home health OT   OT Evaluation Precautions/Restrictions  Precautions Precautions: Fall Precaution Comments: R-hemi; watch for subluxation General   Vital Signs  Pain Pain Assessment Pain Score: 0-No pain Home Living/Prior Functioning Home Living Available Help at Discharge: Available PRN/intermittently, Family Type of Home: House Home Access: Ramped entrance Home Layout: One level Bathroom Shower/Tub: Multimedia programmer: Handicapped height Bathroom Accessibility: Yes Additional Comments: Husband was in a w/c  Lives With: Alone Prior Function Level of Independence: Independent with basic ADLs, Independent with gait, Independent with homemaking with ambulation, Independent with transfers  Able to Take Stairs?: Yes Driving: Yes Comments: owns a rollator and a hurry-cane Vision Baseline Vision/History: No visual deficits Wears Glasses: Reading only Patient Visual Report: Other (comment) Vision Assessment?: Vision impaired- to be further tested in functional context Additional Comments:  Decreased attention to R side Perception  Perception: Impaired Inattention/Neglect: Does not attend to right visual field Praxis Praxis: Impaired Praxis Impairment Details: Motor planning;Ideation Praxis-Other Comments: Mild difficulty noted with grooming tasks and self-feeding Cognition Overall Cognitive Status: Impaired/Different from baseline Arousal/Alertness: Awake/alert Orientation Level: Person;Place;Situation Person: Oriented Place: Oriented Situation: Oriented Year: 2021 (with choices; able to recall president) Month:  (unable to recall with choices and verbal cues) Day of Week: Incorrect Memory: Impaired Memory Impairment: Retrieval deficit;Decreased recall of new information Decreased Long Term Memory: Verbal basic;Functional basic Immediate Memory Recall: Sock;Blue;Bed Memory Recall Sock: With Cue Memory Recall Blue: With Cue Memory Recall Bed: Not able to recall Attention: Sustained Sustained Attention: Impaired Sustained Attention Impairment: Verbal basic;Functional basic Awareness: Impaired Awareness Impairment: Intellectual impairment;Emergent impairment;Anticipatory impairment Problem Solving: Impaired Problem Solving Impairment: Functional basic Safety/Judgment: Impaired Sensation Sensation Light Touch: Impaired by gross assessment Central sensation comments: Patient reports "tingly feeling" in distal R LE, did not report sensation in R UE Light Touch Impaired Details:  Impaired RUE Hot/Cold: Not tested Proprioception: Impaired Detail Proprioception Impaired Details: Impaired RLE (0/5 at R hallux, 1/5 at ankle) Coordination Gross Motor Movements are Fluid and Coordinated: No Fine Motor Movements are Fluid and Coordinated: No Coordination and Movement Description: R hemiparesis Finger Nose Finger Test: unable to complete with RUE Heel Shin Test: dysmetric and limited R LE Motor  Motor Motor: Hemiplegia;Abnormal tone;Ataxia;Motor apraxia;Abnormal  postural alignment and control Motor - Skilled Clinical Observations: R hemiparesis with R lateral lean in sitting with L UE pushing  Trunk/Postural Assessment  Cervical Assessment Cervical Assessment: Exceptions to [facility] (forward head) Thoracic Assessment Thoracic Assessment:  (kyphotic) Lumbar Assessment Lumbar Assessment:  (posterior pelvic tilt) Postural Control Postural Control: Deficits on evaluation Head Control: decreases with fatigue Trunk Control: persistent R lateral lean in sitting Righting Reactions: delayed and inadequate Protective Responses: delayed and inadequate  Balance Balance Balance Assessed: Yes Standardized Balance Assessment Standardized Balance Assessment:  (FIST: 13/56) Static Sitting Balance Static Sitting - Balance Support: Feet supported;Left upper extremity supported Static Sitting - Level of Assistance: 5: Stand by assistance;4: Min assist Dynamic Sitting Balance Dynamic Sitting - Balance Support: During functional activity;Feet supported Dynamic Sitting - Level of Assistance: 4: Min assist Dynamic Sitting - Balance Activities: Lateral lean/weight shifting;Forward lean/weight shifting Static Standing Balance Static Standing - Balance Support: Left upper extremity supported Static Standing - Level of Assistance: 2: Max assist Dynamic Standing Balance Dynamic Standing - Balance Support: During functional activity Dynamic Standing - Level of Assistance: 1: +2 Total assist Extremity/Trunk Assessment RUE Assessment RUE Assessment: Exceptions to [facility] Passive Range of Motion (PROM) Comments: reported pain at apprx 110 degrees shoulder flexion; mild subluxation; elbow, wrist, and hand WFL Active Range of Motion (AROM) Comments: trace movement at triceps/biceps General Strength Comments: 1/5 LUE Assessment LUE Assessment: Within Functional Limits General Strength Comments: 5/5  Care Tool Care Tool Self Care Eating   Eating Assist Level: Minimal  Assistance - Patient > 75%    Oral Care         Bathing   Body parts bathed by patient: Chest;Abdomen;Right upper leg;Left upper leg;Face Body parts bathed by helper: Right arm;Left arm;Front perineal area;Buttocks;Right lower leg;Left lower leg   Assist Level: Moderate Assistance - Patient 50 - 74%    Upper Body Dressing(including orthotics)   What is the patient wearing?: Dress   Assist Level: Maximal Assistance - Patient 25 - 49%    Lower Body Dressing (excluding footwear)   What is the patient wearing?: Incontinence brief Assist for lower body dressing: Total Assistance - Patient < 25%    Putting on/Taking off footwear   What is the patient wearing?: [place] for footwear: Total Assistance - Patient < 25%       Care Tool Toileting Toileting activity Toileting Activity did not occur (Clothing management and hygiene only): N/A (no void or bm)       Care Tool Bed Mobility Roll left and right activity   Roll left and right assist level: Minimal Assistance - Patient > 75%    Sit to lying activity   Sit to lying assist level: Maximal Assistance - Patient 25 - 49%    Lying to sitting edge of bed activity   Lying to sitting edge of bed assist level: Maximal Assistance - Patient 25 - 49%     Care Tool Transfers Sit to stand transfer   Sit to stand assist level: Maximal Assistance - Patient 25 - 49%    Chair/bed transfer   Chair/bed transfer  assist level: 2 Armed forces training and education officer transfer activity did not occur: N/A       Care Tool Cognition Expression of Ideas and Wants Expression of Ideas and Wants: Frequent difficulty - frequently exhibits difficulty with expressing needs and ideas   Understanding Verbal and Non-Verbal Content Understanding Verbal and Non-Verbal Content: Sometimes understands - understands only basic conversations or simple, direct phrases. Frequently requires cues to understand   Memory/Recall Ability *first 3 days only  Memory/Recall Ability *first 3 days only: That he or she is in a hospital/hospital unit    Refer to Care Plan for [facility] OT Short Term Goal 1 (Week 1): Pt will complete toilet transfer with max assist. OT Short Term Goal 2 (Week 1): Pt will engage in self-care task in standing for 30 seconds with mod assist. OT Short Term Goal 3 (Week 1): Pt will don shirt with mod assist OT Short Term Goal 4 (Week 1): Pt will demonstrate dynamic sitting balance for 1 minute with mod asisst.  Recommendations for other services: None    Skilled Therapeutic Intervention OT evaluation completed with son present initially. Discussed role of OT, therapy schedule, DME, fall risk, CVA education, safety plan, and ELOS. Pt received supine in bed with son assisting with feeding. Pt showed no signs of aspiration, using utensils to self feed 65% of the time and demonstrating frequent loss of food from R corner of mouth with no awareness. Pt noted to show difficulty with correct utensil for foods, using fork with cream of wheat, despite cues from son. Pt completed supine>sit with max A then remained sitting EOB ~20 min with min-SBA due to right lateral lean. Completed bathing and dressing with max-total assist while introducing hemi dressing techniques. Pt completed sit<>stand in [place] 4x with max A and facilitation for upright posture. Transitioned back to supine with max A due to significant fatigue. Engaged in ROM assessment then positioned RUE in supported position. OT educated son on positioning of RUE and PROM exercises. Pt left with all needs in reach.   ADL   Mobility  Bed Mobility Bed Mobility: Rolling Right;Rolling Left;Supine to Sit;Sit to Supine;Sitting - Scoot to Edge of Bed Rolling Right: Minimal Assistance - Patient > 75% Rolling Left: Minimal Assistance - Patient > 75% Supine to Sit: Maximal Assistance - Patient - Patient 25-49% Sitting - Scoot to Edge of Bed: Maximal  Assistance - Patient 25-49% Sit to Supine: Maximal Assistance - Patient 25-49% Transfers Sit to Stand: Maximal Assistance - Patient 25-49% Stand to Sit: Maximal Assistance - Patient 25-49%   Discharge Criteria: Patient will be discharged from OT if patient refuses treatment 3 consecutive times without medical reason, if treatment goals not met, if there is a change in medical status, if patient makes no progress towards goals or if patient is discharged from hospital.  The above assessment, treatment plan, treatment alternatives and goals were discussed and mutually agreed upon: by patient  Duayne Cal 01/17/2020, 12:57 PM

## 2020-01-17 NOTE — Evaluation (Signed)
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 Colon polyp   . Diabetes mellitus 1992  . Hypertension 1996  . Pancreatitis    Past Surgical History:  Past Surgical History:  Procedure Laterality Date  . CHOLECYSTECTOMY    . COLONOSCOPY    . ERCP    . PARTIAL HYSTERECTOMY  86years old  . POLYPECTOMY    . UPPER GASTROINTESTINAL ENDOSCOPY    . WRIST FRACTURE SURGERY  left arm,86years old    Assessment / Plan / Recommendation  Patient is a13 y.o.year old femalewithhistory of T2DM, CKD (Dr. Lorrene Reid) HTN, CHF,morbid obesity--BMI 41;who was admitted on 01/04/2020 after collapsing at church during her service. History taken from chart review due to aphasia. On admission she was found to have right hemiparesis with dysarthria. 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 She was started on Cleviprex and labetalol for BP control and required intubation for airway protection due to progressive decline in LOC. Acute hypoxic/hypercapnic respiratory failure felt to be due tohemorrhagic stroke was able to tolerate extubation on 01/06/2020.Follow-up MRI/MRI brain showed no visible mass or vascular lesion, extensive small vessel ischemia and acute left thalamic bleed without significant change. Carotid Dopplers were negative for significant ICA stenosi.Echocardiogram with EF of 55-60% with no wal abnormality and  moderate mitral annular calcification. Tube feeds initiated for nutritional support.IV abx added on 01/08/2020 due to ongoing lethargy as well as low-grade fevers with leukocytosiswith WBC-15.3. Blood cultures x2 from11/10 were negative for growth.  On 01/13/2020,she was noted to have change in mental status with significant somnolence and inability to follow any commands.Repeat CT head showed subacute left thalamic hematoma without acute or interval findings. Dr. Malen Gauze question seizures as cause for mental status change and EEG ordered which was negative for seizures or epileptiform discharges. Her mentation is slowly improving with increase in verbal output and diet has been advanced to dysphagia #2, thin liquids. She has had steady worsening of renal status with recurrent leucocytosis. Mentation continues to fluctuate. Cortak removed today. Therapy cotreatment ongoing with patient showing ability to activate RLE and stand in steady. She continues to have limitations due to right hemiparesis with inattention, cognitive deficits decreased initiation and delayed processing as well as expressive deficits affecting ability to carry out ADLs and mobility. CIR was recommended due to functional decline.   Clinical Impression Patient presents with a mild-moderate oropharyngeal dysphagia and a moderate cognitive-linguisitic impairment and mild-moderate flaccid dysarthria. She exhibited emotional lability and some decreased frustration toleration which was exacerbated by her dwelling on all the things "I cant do". Patient able to express herself at phrase and sentence level with intelligibility of speech fluctuating some with patient fatigue. She was oriented to self and did recognize evaluating SLP ("youre the one who did my (points to throat) test". She stated place as "a home" (referring to a rest home), said day of week was "weekend" but then when asked to specify, said "thursday". She perseverated on  telling SLP "  Im so glad you came back to see me" and was atypically emotional about this (SLP only saw patient on one day for swallow testing on 11/15). Patient did not exhibit any errors in naming, but did exhibit halting speech, apparent word finding versus attention difficulties leading to stops and starts, and incomplete phrases during unstructured language production. Patient had difficulty with attention due to internal distractions and her focusing on concerns and frustrations of things she wouldnt be able to do. 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 Patients dysphagia is primarily oral with a secondary pharyngeal component (as evidenced from prior MBS). She was able to masticate regular solids and kept oral cavity clear of residuals but did exhibit delayed cough and throat clear but unable to definitively say what caused this. SLP was considering upgrading from Dys 2 to Dys 3, however patient became very fatigued and closed eyes and started sleeping and did not respond to SLP or son and so SLP deferring this until a subsequent treatment session/day. Patient's impairments significantly impact her ability to effectively communicate, have needs met, safely perform ADL's, safely consume PO diet. She will benefit from skilled ST intervention to improve her overall cognitive-linguisitic, speech and swallow abilities.  Skilled Therapeutic Interventions          Bedside swallow evaluation, cognitive-linguistic and speech evaluation  SLP Assessment  Patient will need skilled Speech Lanaguage Pathology Services during CIR admission    Recommendations  SLP Diet Recommendations: Dysphagia 2 (Fine chop);Thin Liquid Administration via: Cup;Straw Medication Administration: Crushed with puree Supervision: Full supervision/cueing for compensatory strategies Compensations: Minimize environmental distractions;Slow rate;Small sips/bites;Monitor for  anterior loss Postural Changes and/or Swallow Maneuvers: Seated upright 90 degrees Oral Care Recommendations: Oral care BID Recommendations for Other Services: Neuropsych consult Patient destination: Home Follow up Recommendations: Home Health SLP;24 hour supervision/assistance Equipment Recommended: None recommended by SLP    SLP Frequency 3 to 5 out of 7 days   SLP Duration  SLP Intensity  SLP Treatment/Interventions 3 weeks  Minumum of 1-2 x/day, 30 to 90 minutes  Cognitive remediation/compensation;Functional tasks;Internal/external aids;Cueing hierarchy;Patient/family education;Medication managment;Multimodal communication approach;Environmental controls;Dysphagia/aspiration precaution training;Speech/Language facilitation    Pain Pain Assessment Pain Scale: 0-10 Pain Score: 0-No pain  Prior Functioning Cognitive/Linguistic Baseline: Within functional limits Type of Home: House  Lives With: Alone Available Help at Discharge: Available PRN/intermittently;Family 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[employer])  SLP Evaluation Cognition Overall Cognitive Status: Impaired/Different from baseline Arousal/Alertness: Awake/alert Orientation Level: Oriented to person;Disoriented to time;Disoriented to place;Disoriented to situation Attention: Sustained Sustained Attention: Impaired Sustained Attention Impairment: Verbal basic;Functional basic Memory: Impaired Memory Impairment: Decreased recall of new information;Retrieval deficit;Decreased short term memory Decreased Long Term Memory: Verbal basic Decreased Short Term Memory: Verbal basic Awareness: Impaired Awareness Impairment: Intellectual impairment Problem Solving: Impaired Problem Solving Impairment: Verbal basic Behaviors: Lability Safety/Judgment: Impaired  Comprehension Auditory Comprehension Overall Auditory Comprehension: Impaired Yes/No Questions: Impaired Basic Biographical  Questions: 76-100% accurate Basic Immediate Environment Questions: 75-100% accurate Complex Questions: 50-74% accurate Interfering Components: Anxiety;Processing speed EffectiveTechniques: Extra processing time;Repetition Visual Recognition/Discrimination Discrimination: Not tested Reading Comprehension Reading Status: Not tested Expression Expression Primary Mode of Expression: Verbal Verbal Expression Overall Verbal Expression: Impaired Initiation: No impairment Level of Generative/Spontaneous Verbalization: Phrase Repetition: No impairment Naming: No impairment Pragmatics: Impairment Impairments: Abnormal affect;Topic maintenance Interfering Components: Attention Effective Techniques: Semantic cues;Open ended questions Non-Verbal Means of Communication: Not applicable Written Expression Dominant  Hand: Right Oral Motor Oral Motor/Sensory Function Overall Oral Motor/Sensory Function: Moderate impairment Facial ROM: Reduced right;Suspected CN VII (facial) dysfunction Facial Symmetry: Abnormal symmetry right;Suspected CN VII (facial) dysfunction Facial Strength: Reduced right;Suspected CN VII (facial) dysfunction Lingual ROM: Reduced right;Reduced left;Other (Comment) Motor Speech Overall Motor Speech: Impaired Respiration: Within functional limits Phonation: Normal Resonance: Within functional limits Articulation: Impaired Level of Impairment: Phrase Intelligibility: Intelligibility reduced Word: 75-100% accurate Phrase: 50-74% accurate Sentence: 50-74% accurate Conversation: 25-49% accurate Motor Planning: Witnin functional limits Motor Speech Errors: Not applicable  Care Tool Care Tool Cognition Expression of Ideas and Wants Expression of Ideas and Wants: Frequent difficulty - frequently exhibits difficulty with expressing needs and ideas   Understanding Verbal and Non-Verbal Content Understanding Verbal and Non-Verbal Content: Sometimes understands - understands  only basic conversations or simple, direct phrases. Frequently requires cues to understand   Memory/Recall Ability *first 3 days only Memory/Recall Ability *first 3 days only: That he or she is in a hospital/hospital unit     PMSV Assessment  PMSV Trial Intelligibility: Intelligibility reduced Word: 75-100% accurate Phrase: 50-74% accurate Sentence: 50-74% accurate Conversation: 50-74% accurate  Bedside Swallowing Assessment General Date of Onset: 01/12/20 Previous Swallow Assessment: MBS 11/15 Diet Prior to this Study: Dysphagia 2 (chopped);Thin liquids Temperature Spikes Noted: No Respiratory Status: Room air History of Recent Intubation: Yes Length of Intubations (days): 2 days Date extubated: 01/06/20 Behavior/Cognition: Alert;Cooperative Oral Cavity - Dentition: Adequate natural dentition Self-Feeding Abilities: Able to feed self;Needs set up Vision: Functional for self-feeding Patient Positioning: Upright in bed Baseline Vocal Quality: Normal Volitional Cough: Strong Volitional Swallow: Able to elicit  Oral Care Assessment   Ice Chips  NT Thin Liquid Thin Liquid: Impaired Presentation: Self Fed;Straw Pharyngeal  Phase Impairments: Throat Clearing - Delayed;Cough - Delayed Other Comments: Delayed coughing which occured after regular solids PO's but cannot differentiate between thin liquids or regular solids or something else causing this Nectar Thick  NT Honey Thick  NT Puree Puree: Not tested Solid Solid: Impaired Oral Phase Impairments: Impaired mastication Oral Phase Functional Implications: Prolonged oral transit Pharyngeal Phase Impairments: Throat Clearing - Delayed;Cough - Delayed Other Comments: Delayed coughing which occured after regular solids PO's but cannot differentiate between thin liquids or regular solids or something else causing this BSE Assessment Risk for Aspiration Impact on safety and function: Mild aspiration risk Other Related Risk  Factors: Prolonged intubation;Cognitive impairment  Short Term Goals: Week 1: SLP Short Term Goal 1 (Week 1): Patient will consume trials of Dys 3 solids without overt coughing/throat clearing and with full clearance of oral cavity, with SLP only, with minA. SLP Short Term Goal 2 (Week 1): Patient will demonstrate orientation to time/place/situation and recall recent daily events with minA cues for use of memory strategies as needed. SLP Short Term Goal 3 (Week 1): Patient will be able to achieve 85% intelligibilty at conversational level with SLP during semi-structured conversations, with modA cues. SLP Short Term Goal 4 (Week 1): Patient will perform basic level functional problem solving tasks with modA cues for accuracy. SLP Short Term Goal 5 (Week 1): Patient will demonstrate awareness to both deficits and progress towards goals during structured discussion with SLP, with modA cues.  Refer to Care Plan for Long Term Goals  Recommendations for other services: Neuropsych  Discharge Criteria: Patient will be discharged from SLP if patient refuses treatment 3 consecutive times without medical reason, if treatment goals not met, if there is a change in medical status, if patient makes no progress  towards goals or if patient is discharged from hospital.  The above assessment, treatment plan, treatment alternatives and goals were discussed and mutually agreed upon: by patient and by family    Sonia Baller, MA, CCC-SLP Speech Therapy

## 2020-01-18 ENCOUNTER — Inpatient Hospital Stay (HOSPITAL_COMMUNITY): Payer: Medicare Other

## 2020-01-18 ENCOUNTER — Inpatient Hospital Stay (HOSPITAL_COMMUNITY): Payer: Medicare Other | Admitting: Speech Pathology

## 2020-01-18 ENCOUNTER — Inpatient Hospital Stay (HOSPITAL_COMMUNITY): Payer: Medicare Other | Admitting: Occupational Therapy

## 2020-01-18 DIAGNOSIS — I619 Nontraumatic intracerebral hemorrhage, unspecified: Secondary | ICD-10-CM | POA: Diagnosis not present

## 2020-01-18 LAB — GLUCOSE, CAPILLARY
Glucose-Capillary: 65 mg/dL — ABNORMAL LOW (ref 70–99)
Glucose-Capillary: 103 mg/dL — ABNORMAL HIGH (ref 70–99)
Glucose-Capillary: 230 mg/dL — ABNORMAL HIGH (ref 70–99)
Glucose-Capillary: 143 mg/dL — ABNORMAL HIGH (ref 70–99)
Glucose-Capillary: 116 mg/dL — ABNORMAL HIGH (ref 70–99)
Glucose-Capillary: 101 mg/dL — ABNORMAL HIGH (ref 70–99)

## 2020-01-18 LAB — URINALYSIS, ROUTINE W REFLEX MICROSCOPIC
Bilirubin Urine: NEGATIVE
Glucose, UA: NEGATIVE mg/dL
Hgb urine dipstick: NEGATIVE
Ketones, ur: NEGATIVE mg/dL
Leukocytes,Ua: NEGATIVE
Nitrite: NEGATIVE
Protein, ur: NEGATIVE mg/dL
Specific Gravity, Urine: 1.015 (ref 1.005–1.030)
pH: 6 (ref 5.0–8.0)

## 2020-01-18 MED ORDER — METOPROLOL TARTRATE 50 MG PO TABS
50.0000 mg | ORAL_TABLET | Freq: Once | ORAL | Status: AC
  Administered 2020-01-18: 50 mg via ORAL

## 2020-01-18 MED ORDER — FUROSEMIDE 20 MG PO TABS
10.0000 mg | ORAL_TABLET | Freq: Every day | ORAL | Status: DC
  Administered 2020-01-19 – 2020-02-12 (×25): 10 mg via ORAL
  Filled 2020-01-18 (×25): qty 1

## 2020-01-18 MED ORDER — INSULIN GLARGINE 100 UNIT/ML ~~LOC~~ SOLN
48.0000 [IU] | Freq: Two times a day (BID) | SUBCUTANEOUS | Status: DC
  Administered 2020-01-18 – 2020-01-20 (×4): 48 [IU] via SUBCUTANEOUS
  Filled 2020-01-18 (×5): qty 0.48

## 2020-01-18 MED ORDER — SODIUM CHLORIDE 0.45 % IV SOLN: INTRAVENOUS | Status: DC

## 2020-01-18 NOTE — Progress Notes (Signed)
Per report from prior nurse, family son  Is requesting medication for patient spasms

## 2020-01-18 NOTE — Progress Notes (Signed)
[place] PHYSICAL MEDICINE & REHABILITATION PROGRESS NOTE   Subjective/Complaints:  Pt would like to be taken to the toilet more often, discussed need to increase fluids which pt  States she can do, does not want IV  Review of systems no shortness of breath or coughing no abdominal pain or nausea  Objective:   No results found. Recent Labs    01/16/20 0254 01/17/20 0121  WBC 12.9* 11.0*  HGB 13.1 12.2  HCT 40.1 38.3  PLT 281 252   Recent Labs    01/16/20 0254 01/17/20 0121  NA 139 139  K 3.8 4.0  CL 102 101  CO2 26 27  GLUCOSE 104* 164*  BUN 73* 71*  CREATININE 1.61* 1.60*  CALCIUM 8.5* 8.4*    Intake/Output Summary (Last 24 hours) at 01/18/2020 1103 Last data filed at 01/18/2020 0900 Gross per 24 hour  Intake 478 ml  Output 325 ml  Net 153 ml        Physical Exam: Vital Signs Blood pressure (!) 154/62, pulse 64, temperature 98.4 F (36.9 C), temperature source Oral, resp. rate 20, height 5\' 3"  (1.6 m), weight 101.3 kg, SpO2 97 %.   General: No acute distress Mood and affect are appropriate Heart: Regular rate and rhythm no rubs murmurs or extra sounds Lungs: Clear to auscultation, breathing unlabored, no rales or wheezes Abdomen: Positive bowel sounds, soft nontender to palpation, nondistended Extremities: No clubbing, cyanosis, or edema Skin: No evidence of breakdown, no evidence of rash   Neurologic: Cranial nerves II through XII intact, motor strength is 5/5 in Left 0/5 RIgh t  deltoid, bicep, tricep, grip,5/5 left and 4/5 RIght  hip flexor, knee extensors, ankle dorsiflexor and plantar flexor Sensory exam normal sensation to light touch and proprioception in bilateral upper and lower extremities Cerebellar exam normal finger to nose to finger as well as heel to shin in bilateral upper and lower extremities Musculoskeletal: Full range of motion in all 4 extremities. No joint swelling  Assessment/Plan: 1. Functional deficits which require 3+  hours per day of interdisciplinary therapy in a comprehensive inpatient rehab setting.  Physiatrist is providing close team supervision and 24 hour management of active medical problems listed below.  Physiatrist and rehab team continue to assess barriers to discharge/monitor patient progress toward functional and medical goals  Care Tool:  Bathing    Body parts bathed by patient: Chest, Abdomen, Right upper leg, Left upper leg, Face   Body parts bathed by helper: Right arm, Left arm, Front perineal area, Buttocks, Right lower leg, Left lower leg     Bathing assist Assist Level: Moderate Assistance - Patient 50 - 74%     Upper Body Dressing/Undressing Upper body dressing   What is the patient wearing?: Dress    Upper body assist Assist Level: Maximal Assistance - Patient 25 - 49%    Lower Body Dressing/Undressing Lower body dressing      What is the patient wearing?: Incontinence brief     Lower body assist Assist for lower body dressing: 2 Helpers     Toileting Toileting Toileting Activity did not occur (Clothing management and hygiene only): N/A (no void or bm)  Toileting assist Assist for toileting: 2 Helpers     Transfers Chair/bed transfer  Transfers assist     Chair/bed transfer assist level: Total Assistance - Patient < 25%     Locomotion Ambulation   Ambulation assist   Ambulation activity did not occur: Safety/medical concerns  Assist level: 2 helpers  Assistive device: Parallel bars Max distance: 8   Walk 10 feet activity   Assist  Walk 10 feet activity did not occur: Safety/medical concerns (due patient fatigue/weakness)        Walk 50 feet activity   Assist Walk 50 feet with 2 turns activity did not occur: Safety/medical concerns (due patient fatigue/weakness)         Walk 150 feet activity   Assist Walk 150 feet activity did not occur: Safety/medical concerns (due patient fatigue/weakness)         Walk 10 feet on  uneven surface  activity   Assist Walk 10 feet on uneven surfaces activity did not occur: Safety/medical concerns (due patient fatigue/weakness)         Wheelchair     Assist Will patient use wheelchair at discharge?: Yes Type of Wheelchair: Manual    Wheelchair assist level: Maximal Assistance - Patient 25 - 49% Max wheelchair distance: 25    Wheelchair 50 feet with 2 turns activity    Assist        Assist Level: Total Assistance - Patient < 25%   Wheelchair 150 feet activity     Assist      Assist Level: Total Assistance - Patient < 25%   Blood pressure (!) 154/62, pulse 64, temperature 98.4 F (36.9 C), temperature source Oral, resp. rate 20, height 5\' 3"  (1.6 m), weight 101.3 kg, SpO2 97 %.  Medical Problem List and Plan: 1. 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             -patient may shower,son who is  health care POA wants full code, pt concurs             -ELOS/Goals: 18-22 days/Supervision/Min A CIR PT< OT, SLP 2. Antithrombotics: -DVT/anticoagulation:Pharmaceutical:Heparin -antiplatelet therapy: N/a 3. Pain Management:Tylenol as needed 4. Mood:LCSW to follow for evaluation and support when appropriate. -antipsychotic agents: N/A 5. Neuropsych: This patientis notcapable of making decisions on herown behalf. 6. Skin/Wound Care:Routine pressure-relief measures. Maintain adequate nutrition and hydration status. 7. Fluids/Electrolytes/Nutrition:Monitor intake/output--will order calorie count as cortak removed today. Will need assistance with feeding as due to fluctuating bouts of lethargy.  8. HTN: Monitor BP TID--continue Lasix, Hydralazine, Metoprolol, HCTZ,              Vitals:   01/17/20 2019  01/18/20 0355  BP: (!) 158/54 (!) 154/62  Pulse: 70 64  Resp: 16 20  Temp: 98.2 F (36.8 C) 98.4 F (36.9 C)  SpO2: 97% 97%  elevated systolic this am  9. Acute on chronic renal failure: BUN/SCr 34/1.5 at admission -->73/1.61--Question due to multiple diuretics on board. Also has elevated phos/Mg levels-->repeat labs ordered.   Pt would like to try increasing po liquid drank ensure and coffee this am per son, will reduce lasix to 10mg  Gr 2 diastolic dysfunction 10. PNA?/Tracheobronchitis: Completed course of antibiotics on  11. Leukocytosis: Continue to monitor for signs of infection.              CBC ordered 12. T2DM with hyperglycemia:  Monitor BS ac/hs. Continue Lantus 50 units bid--d/c meal coverage as tube feeds d/c.    CBG (last 3)  Recent Labs    01/17/20 2119 01/18/20 0622 01/18/20 0654  GLUCAP 131* 65* 103*  Low am CBG reduce lantus to 48U  13.  Post stroke dysphagia :             D2 thins, advance diet as tolerated.     LOS: 2 days A FACE TO FACE EVALUATION WAS PERFORMED  Erick Colace 01/18/2020, 11:03 AM

## 2020-01-18 NOTE — Progress Notes (Signed)
Physical Therapy Session Note  Patient Details  Name: [name] [name] MRN: [id number] Date of Birth: [birth year]  Today's Date: 01/18/2020 PT Individual Time: 0800-0920 PT Individual Time Calculation (min): 80 min   Short Term Goals: Week 1:  PT Short Term Goal 1 (Week 1): Patient will roll B with no more than CGA and use of bed rails as needed PT Short Term Goal 2 (Week 1): Patient will transition supine > sit with no more than ModA x1 consistently PT Short Term Goal 3 (Week 1): Patient will transfer bed <> wc with no more than MaxA x1 at least 50% of the time with LRAD PT Short Term Goal 4 (Week 1): Patient will ambulate >91ft with +2 assist and LRAD  Skilled Therapeutic Interventions/Progress Updates:  Pt propped up in bed, being fed small bites of scrambled eggs by son [name] [name].  She denied pain. In nearly flat bed, rolling L/R with min assist and rails, for donning pants in bed with max assist.  L sidelying> sitting with max assist.  Pt sat EOB holding onto footboard with L hand, with supervision.  PT donned non slip socks.  [name] [name] left, and son [name] [name] arrived.   Squat pivot transfer bed> w/c to L with max/total assist due to difficulty elevating hips.  neuromuscular re-education via multimodal cues for supine-AROM/PROM R LE for mass flexion/extension;  alternating reciprocal movement bil LEs, using Kinetron from w/c level, x 25 cycles x 2.  Resistance 50 cm/sec.   In parallel bars, reciprocal scooting in w/c with max assist, then pulling up iwht L hand on bar, to stand with max assist.  Son had reported that pt has a lift chair which which she used, PTA, due to knee pain and planned R TKA.  Pt stood with mod assist, bil hands on bars, with guarding at R knee, x 30 seconds, and 2 x 5 mini squats, without knee pain R .  Therapeutic activity in sitting in w/c, for transfer training to facilitate forward wt shift: with wash cloth under clasped hands in front of her, trunk flexion/extension  while sliding bil hands forward/backward on table in front/below her, x 30 reps.  At end of session, pt seated in w/c with needs at hand and seat belt alarm set, [name] present.  IV in L arm noted to have bled onto her gown; PT notified Angelina, RN who arrived to remove it.  Precautions Precautions: Fall Precaution Comments: R-hemi; watch for subluxation Restrictions Weight Bearing Restrictions: No        Therapy/Group: Individual Therapy  [name] 01/18/2020, 10:34 AM

## 2020-01-18 NOTE — Progress Notes (Signed)
Occupational Therapy Session Note  Patient Details  Name: [name] [name] MRN: [id number] Date of Birth: September 07, 1932  Today's Date: 01/18/2020 OT Individual Time: 1105-1200 OT Individual Time Calculation (min): 55 min   Short Term Goals: Week 1:  OT Short Term Goal 1 (Week 1): Pt will complete toilet transfer with max assist. OT Short Term Goal 2 (Week 1): Pt will engage in self-care task in standing for 30 seconds with mod assist. OT Short Term Goal 3 (Week 1): Pt will don shirt with mod assist OT Short Term Goal 4 (Week 1): Pt will demonstrate dynamic sitting balance for 1 minute with mod asisst.  Skilled Therapeutic Interventions/Progress Updates:    Pt greeted semi-reclined in bed with son present. Pt initially reporting too tired for therapy, but pt then agreeable to get dressed at EOB. Pt needed increased time and mod A for bed mobility today. Worked on weight bearing through R elbow when elevating trunk into sitting. Worked on sitting balance at EOB with CGA progressing to supervision in static sitting. Pt then needed min/mod A for dynamic task of reaching forward to help don pants. Overall total A to thread pant legs. Sit<>stand at EOB with max A, then +2 assisted with pulling pants up over hips. Max cues for hemi dressing techniques and to bring attention to R UE. R NMR seated EOB with joint input to bring pt through full elbow,shoulder, wrist ROM. Pt with some flexor synergy noted. OT then placed kinesiotape to R hand for swelling and for R UE attention. Pt returned to bed with mod A and left semi-reclined in bed with bed alarm on, call bell in reach, and needs met   Therapy Documentation Precautions:  Precautions Precautions: Fall Precaution Comments: R-hemi; watch for subluxation Restrictions Weight Bearing Restrictions: No Pain:  Denies pain    Therapy/Group: Individual Therapy  Valma Cava 01/18/2020, 12:35 PM

## 2020-01-18 NOTE — Progress Notes (Signed)
Speech Language Pathology Daily Session Note  Patient Details  Name: BRITTLEY REGNER MRN: [id number] Date of Birth: 07-27-1932  Today's Date: 01/18/2020 SLP Individual Time: 1300-1315 SLP Individual Time Calculation (min): 15 min  Short Term Goals: Week 1: SLP Short Term Goal 1 (Week 1): Patient will consume trials of Dys 3 solids without overt coughing/throat clearing and with full clearance of oral cavity, with SLP only, with minA. SLP Short Term Goal 2 (Week 1): Patient will demonstrate orientation to time/place/situation and recall recent daily events with minA cues for use of memory strategies as needed. SLP Short Term Goal 3 (Week 1): Patient will be able to achieve 85% intelligibilty at conversational level with SLP during semi-structured conversations, with modA cues. SLP Short Term Goal 4 (Week 1): Patient will perform basic level functional problem solving tasks with modA cues for accuracy. SLP Short Term Goal 5 (Week 1): Patient will demonstrate awareness to both deficits and progress towards goals during structured discussion with SLP, with modA cues.  Skilled Therapeutic Interventions: Pt was seen for skilled ST targeting attempts at arousal and education with pt's son at bedside. Pt was sleeping soundly on arrival and unable to arouse even briefly despite Max A multimodal attempts/interventions including sternal run, changes in environmental stimuli, cold compress, etc. SLP did provide skilled education to son regarding current modified diet texture (Dysphagia 2) and compensatory strategies for oral clearance of pocketing. Also discussed importance of positioning and potentially being up in chair for meals. All questions answered to his satisfaction at this time. Pt missed remaining 45 mins of session given that she still could not be aroused to participate. Will continue efforts to see pt as schedule allows. Pt left laying in bed with alarm set and needs within reach. Continue per  current plan of care.        Pain Pain Assessment Pain Scale: Faces Pain Score: Asleep Faces Pain Scale: No hurt  Therapy/Group: Individual Therapy  JENNIE HANNAY 01/18/2020, 1:18 PM

## 2020-01-18 NOTE — Progress Notes (Signed)
Notified on call( Dr. Rodman Pickle) for B/P 126/40 , new order recieved

## 2020-01-19 ENCOUNTER — Inpatient Hospital Stay (HOSPITAL_COMMUNITY): Payer: Medicare Other

## 2020-01-19 ENCOUNTER — Inpatient Hospital Stay (HOSPITAL_COMMUNITY): Payer: Medicare Other | Admitting: Occupational Therapy

## 2020-01-19 DIAGNOSIS — I639 Cerebral infarction, unspecified: Secondary | ICD-10-CM

## 2020-01-19 DIAGNOSIS — I619 Nontraumatic intracerebral hemorrhage, unspecified: Secondary | ICD-10-CM | POA: Diagnosis not present

## 2020-01-19 LAB — BASIC METABOLIC PANEL
Anion gap: 13 (ref 5–15)
BUN: 49 mg/dL — ABNORMAL HIGH (ref 8–23)
CO2: 26 mmol/L (ref 22–32)
Calcium: 8.6 mg/dL — ABNORMAL LOW (ref 8.9–10.3)
Chloride: 97 mmol/L — ABNORMAL LOW (ref 98–111)
Creatinine, Ser: 1.49 mg/dL — ABNORMAL HIGH (ref 0.44–1.00)
GFR, Estimated: 34 mL/min — ABNORMAL LOW (ref >60)
Glucose, Bld: 323 mg/dL — ABNORMAL HIGH (ref 70–99)
Potassium: 5.4 mmol/L — ABNORMAL HIGH (ref 3.5–5.1)
Sodium: 136 mmol/L (ref 135–145)

## 2020-01-19 LAB — CBC
HCT: 41 % (ref 36.0–46.0)
Hemoglobin: 13.1 g/dL (ref 12.0–15.0)
MCH: 30.5 pg (ref 26.0–34.0)
MCHC: 32 g/dL (ref 30.0–36.0)
MCV: 95.3 fL (ref 80.0–100.0)
Platelets: 321 10*3/uL (ref 150–400)
RBC: 4.3 MIL/uL (ref 3.87–5.11)
RDW: 15 % (ref 11.5–15.5)
WBC: 9.8 10*3/uL (ref 4.0–10.5)
nRBC: 0 % (ref 0.0–0.2)

## 2020-01-19 LAB — URINE CULTURE: Culture: NO GROWTH

## 2020-01-19 LAB — GLUCOSE, CAPILLARY
Glucose-Capillary: 53 mg/dL — ABNORMAL LOW (ref 70–99)
Glucose-Capillary: 69 mg/dL — ABNORMAL LOW (ref 70–99)
Glucose-Capillary: 74 mg/dL (ref 70–99)
Glucose-Capillary: 120 mg/dL — ABNORMAL HIGH (ref 70–99)
Glucose-Capillary: 243 mg/dL — ABNORMAL HIGH (ref 70–99)
Glucose-Capillary: 214 mg/dL — ABNORMAL HIGH (ref 70–99)
Glucose-Capillary: 223 mg/dL — ABNORMAL HIGH (ref 70–99)

## 2020-01-19 MED ORDER — INSULIN ASPART 100 UNIT/ML ~~LOC~~ SOLN
0.0000 [IU] | Freq: Every day | SUBCUTANEOUS | Status: DC
  Administered 2020-01-22 – 2020-01-25 (×2): 2 [IU] via SUBCUTANEOUS
  Administered 2020-01-26: 3 [IU] via SUBCUTANEOUS
  Administered 2020-01-27: 2 [IU] via SUBCUTANEOUS
  Administered 2020-01-28: 5 [IU] via SUBCUTANEOUS
  Administered 2020-01-29: 4 [IU] via SUBCUTANEOUS
  Administered 2020-01-30: 2 [IU] via SUBCUTANEOUS
  Administered 2020-02-01 – 2020-02-05 (×5): 3 [IU] via SUBCUTANEOUS
  Administered 2020-02-06: 2 [IU] via SUBCUTANEOUS
  Administered 2020-02-07: 3 [IU] via SUBCUTANEOUS
  Administered 2020-02-08: 2 [IU] via SUBCUTANEOUS
  Administered 2020-02-09 – 2020-02-11 (×3): 3 [IU] via SUBCUTANEOUS

## 2020-01-19 MED ORDER — DEXTROSE 50 % IV SOLN
INTRAVENOUS | Status: AC
  Filled 2020-01-19: qty 50

## 2020-01-19 MED ORDER — GLUCOSE 40 % PO GEL
ORAL | Status: AC
  Administered 2020-01-19: 37.5 g
  Filled 2020-01-19: qty 1

## 2020-01-19 MED ORDER — QUETIAPINE FUMARATE 25 MG PO TABS
25.0000 mg | ORAL_TABLET | Freq: Every evening | ORAL | Status: DC | PRN
  Filled 2020-01-19: qty 1

## 2020-01-19 MED ORDER — INSULIN ASPART 100 UNIT/ML ~~LOC~~ SOLN
0.0000 [IU] | Freq: Three times a day (TID) | SUBCUTANEOUS | Status: DC
  Administered 2020-01-19 (×2): 3 [IU] via SUBCUTANEOUS
  Administered 2020-01-21: 1 [IU] via SUBCUTANEOUS
  Administered 2020-01-22 (×3): 2 [IU] via SUBCUTANEOUS
  Administered 2020-01-23: 1 [IU] via SUBCUTANEOUS
  Administered 2020-01-23: 2 [IU] via SUBCUTANEOUS
  Administered 2020-01-23: 1 [IU] via SUBCUTANEOUS
  Administered 2020-01-24: 2 [IU] via SUBCUTANEOUS
  Administered 2020-01-24 – 2020-01-26 (×5): 1 [IU] via SUBCUTANEOUS
  Administered 2020-01-26: 2 [IU] via SUBCUTANEOUS
  Administered 2020-01-26: 1 [IU] via SUBCUTANEOUS
  Administered 2020-01-26 – 2020-01-27 (×2): 2 [IU] via SUBCUTANEOUS
  Administered 2020-01-27 (×2): 3 [IU] via SUBCUTANEOUS
  Administered 2020-01-28: 5 [IU] via SUBCUTANEOUS
  Administered 2020-01-28: 3 [IU] via SUBCUTANEOUS
  Administered 2020-01-28 – 2020-01-29 (×4): 7 [IU] via SUBCUTANEOUS
  Administered 2020-01-30: 2 [IU] via SUBCUTANEOUS
  Administered 2020-01-30 (×2): 3 [IU] via SUBCUTANEOUS
  Administered 2020-01-31 (×2): 2 [IU] via SUBCUTANEOUS
  Administered 2020-01-31: 1 [IU] via SUBCUTANEOUS
  Administered 2020-02-01: 3 [IU] via SUBCUTANEOUS
  Administered 2020-02-01: 2 [IU] via SUBCUTANEOUS
  Administered 2020-02-01: 1 [IU] via SUBCUTANEOUS
  Administered 2020-02-02: 2 [IU] via SUBCUTANEOUS
  Administered 2020-02-02: 3 [IU] via SUBCUTANEOUS
  Administered 2020-02-02 – 2020-02-03 (×4): 2 [IU] via SUBCUTANEOUS
  Administered 2020-02-04: 5 [IU] via SUBCUTANEOUS
  Administered 2020-02-04 (×2): 2 [IU] via SUBCUTANEOUS
  Administered 2020-02-05 (×3): 3 [IU] via SUBCUTANEOUS
  Administered 2020-02-06: 2 [IU] via SUBCUTANEOUS
  Administered 2020-02-06: 5 [IU] via SUBCUTANEOUS
  Administered 2020-02-06: 1 [IU] via SUBCUTANEOUS
  Administered 2020-02-07: 3 [IU] via SUBCUTANEOUS
  Administered 2020-02-07: 2 [IU] via SUBCUTANEOUS
  Administered 2020-02-07: 5 [IU] via SUBCUTANEOUS
  Administered 2020-02-08 (×2): 2 [IU] via SUBCUTANEOUS
  Administered 2020-02-08: 1 [IU] via SUBCUTANEOUS
  Administered 2020-02-09 (×3): 3 [IU] via SUBCUTANEOUS
  Administered 2020-02-10: 2 [IU] via SUBCUTANEOUS
  Administered 2020-02-10: 3 [IU] via SUBCUTANEOUS
  Administered 2020-02-10: 5 [IU] via SUBCUTANEOUS
  Administered 2020-02-11: 2 [IU] via SUBCUTANEOUS
  Administered 2020-02-11 – 2020-02-12 (×3): 3 [IU] via SUBCUTANEOUS
  Administered 2020-02-12: 2 [IU] via SUBCUTANEOUS

## 2020-01-19 MED ORDER — GLUCOSE 40 % PO GEL
1.0000 | Freq: Once | ORAL | Status: AC
  Administered 2020-01-19: 37.5 g via ORAL

## 2020-01-19 MED ORDER — GLUCERNA SHAKE PO LIQD: 237.0000 mL | Freq: Two times a day (BID) | ORAL | Status: DC | PRN

## 2020-01-19 NOTE — Progress Notes (Signed)
Physical Therapy Session Note  Patient Details  Name: [name] [name] MRN: [id number] Date of Birth: Jul 14, 1932  Today's Date: 01/19/2020 PT Individual Time: 1050-1200 PT Individual Time Calculation (min): 70 min   Short Term Goals: Week 1:  PT Short Term Goal 1 (Week 1): Patient will roll B with no more than CGA and use of bed rails as needed PT Short Term Goal 2 (Week 1): Patient will transition supine > sit with no more than ModA x1 consistently PT Short Term Goal 3 (Week 1): Patient will transfer bed <> wc with no more than MaxA x1 at least 50% of the time with LRAD PT Short Term Goal 4 (Week 1): Patient will ambulate >39ft with +2 assist and LRAD  Skilled Therapeutic Interventions/Progress Updates:    Pt received supine in bed, son Link Snuffer at bedside, pt agreeable to PT session. No reports of pain. Supine<>sit with modA with bed features, including HOB raised and use of bed rail. Able to sit EOB with CGA, unsupported. Performed squat<>pivot transfer with maxA, towards stronger L side, from EOB to w/c with cues provided for head/hip LUE placement, and general sequencing. W/c transport for time management from her room to main therapy gym. Squat<>pivot transfer performed in similar fashion as outlined above from w/c to mat table. Pt tolerating unsupported sitting EOM for >25 minutes during session, with CGA. She completed NMR for sitting balance, focusing on upright posture and core facilitation. She performed the following: -2x5 R lateral elbow leans -2x5 L lateral elbow leans -2x5 modified crunch to large wedge *required extended and frequent seated rest breaks 2/2 fatigue, for completion  Focused remainder of session on repeated sit<>stands with Stedy assist, requiring modA for rising and minA for standing balance. Again, requiring frequent rest breaks for completion 2/2 fatigue. Worked on standing tolerance, able to stand variable lengths from 34min45 seconds to 30min15seconds. On 3rd  stand, instructed her to place cards on mirror with LUE, focusing on functional reach and standing balance. Used Stedy rather than squat<>pivot to transfer her back to her w/c due to fatigue and no +2 assist available. Returned to her room where she remained seated in w/c with safety belt alarm on, son at bedside, needs in reach. Informed son of pt's progress with mobility.   Therapy Documentation Precautions:  Precautions Precautions: Fall Precaution Comments: R-hemi; watch for subluxation Restrictions Weight Bearing Restrictions: No  Therapy/Group: Individual Therapy  Allyssia Skluzacek P Shakeira Rhee PT 01/19/2020, 11:23 AM

## 2020-01-19 NOTE — Progress Notes (Signed)
Lower extremity venous has been completed.   Preliminary results in CV Proc.   Blanch Media 01/19/2020 10:33 AM

## 2020-01-19 NOTE — Progress Notes (Signed)
Occupational Therapy Session Note  Patient Details  Name: [name] [name] MRN: [id number] Date of Birth: [birth year]  Today's Date: 01/19/2020 OT Individual Time: 1349-1435 OT Individual Time Calculation (min): 46 min    Short Term Goals: Week 1:  OT Short Term Goal 1 (Week 1): Pt will complete toilet transfer with max assist. OT Short Term Goal 2 (Week 1): Pt will engage in self-care task in standing for 30 seconds with mod assist. OT Short Term Goal 3 (Week 1): Pt will don shirt with mod assist OT Short Term Goal 4 (Week 1): Pt will demonstrate dynamic sitting balance for 1 minute with mod asisst.  Skilled Therapeutic Interventions/Progress Updates:    Pt sitting up in w/c, no c/o pain, requesting to use the bathroom and wash her hair during this OT session.  Pt completed sit<>stand transfer at stedy with mod assist and max VCs for body mechanics with CGA during transport in seated stedy position to toilet.  Pt had continent episode of urine.  Sit<>stand at stedy with mod assist and clothing mgt/pericare completed in standing with max assist due to pt requiring LUE support to maintain balance.  Pt completed sit<>stand with mod assist at w/c.  Pt doffed shirt with mod assist using hemitechnique.  Pt bathed UB with mod assist and educated on hemitechnique to bath under RUE using gravity to position arm.  Pt washed hair at sinkside using washcloth with mod assist.  Pt donned shirt with mod assist using VCs to implement hemitechnique.  Pt requesting back to bed.  Mod assist at stedy for sit<>stand to EOB.  Mod assist sit to supine.  Call bell in reach, bed alarm on.    Therapy Documentation Precautions:  Precautions Precautions: Fall Precaution Comments: R-hemi; watch for subluxation Restrictions Weight Bearing Restrictions: No   Therapy/Group: Individual Therapy  Amie Critchley 01/19/2020, 3:42 PM

## 2020-01-19 NOTE — Progress Notes (Signed)
[facility]patient Rehabilitation Center Individual Statement of Services  Patient Name:  [name] [name]  Date:  01/19/2020  Welcome to the Inpatient Rehabilitation Center.  Our goal is to provide you with an individualized program based on your diagnosis and situation, designed to meet your specific needs.  With this comprehensive rehabilitation program, you will be expected to participate in at least 3 hours of rehabilitation therapies Monday-Friday, with modified therapy programming on the weekends.  Your rehabilitation program will include the following services:  Physical Therapy (PT), Occupational Therapy (OT), Speech Therapy (ST), 24 hour per day rehabilitation nursing, Neuropsychology, Care Coordinator, Rehabilitation Medicine, Nutrition Services and Pharmacy Services  Weekly team conferences will be held on wednedday to discuss your progress.  Your Inpatient Rehabilitation Care Coordinator will talk with you frequently to get your input and to update you on team discussions.  Team conferences with you and your family in attendance may also be held.  Expected length of stay: 18-22 days  Overall anticipated outcome: min assist overall  Depending on your progress and recovery, your program may change. Your Inpatient Rehabilitation Care Coordinator will coordinate services and will keep you informed of any changes. Your Inpatient Rehabilitation Care Coordinator's name and contact numbers are listed  below.  The following services may also be recommended but are not provided by the Inpatient Rehabilitation Center:   Driving Evaluations  Home Health Rehabiltiation Services  Outpatient Rehabilitation Services    Arrangements will be made to provide these services after discharge if needed.  Arrangements include referral to agencies that provide these services.  Your insurance has been verified to be:  [employer]-Medicare Your primary doctor is:  [name]  Pertinent information will be shared  with your doctor and your insurance company.  Inpatient Rehabilitation Care Coordinator:  Dossie Der, [place] [phone] or Luna Glasgow  Information discussed with and copy given to patient by: Lucy Chris, 01/19/2020, 10:44 AM

## 2020-01-19 NOTE — IPOC Note (Signed)
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)     Functional Problem List: Nursing Edema, Endurance, Medication Management, Bowel, Bladder, Safety, Skin Integrity  PT Balance, Edema, Endurance, Motor, Nutrition, Perception, Safety, Sensory, Skin Integrity  OT Balance, Safety, Behavior, Sensory, Cognition, Skin Integrity, Vision, Edema, Endurance, Motor, Nutrition, Pain, Perception  SLP Cognition, Nutrition, Perception, Safety, Behavior  TR         Basic ADL's: OT Grooming, Eating, Bathing, Dressing, Toileting     Advanced  ADL's: OT       Transfers: PT Bed Mobility, Bed to Chair, Car, Occupational psychologist, Research scientist (life sciences): PT Ambulation, Psychologist, prison and probation services, Stairs     Additional Impairments: OT Fuctional Use of Upper Extremity  SLP Swallowing, Communication, Social Cognition comprehension, expression Social Interaction, Problem Solving, Memory, Attention, Awareness  TR      Anticipated Outcomes Item Anticipated Outcome  Self Feeding supervision  Swallowing  mod I for Dys 3 versus Regular solids, thin liquids   Basic self-care  min-supervision  Toileting  min assist   Bathroom Transfers min assist  Bowel/Bladder  manage bowel and bladder with min assist  Transfers  CGA/MinA  Locomotion  CGA/MinA  Communication  mod I basic, supervision mild complex  Cognition  supervision basic, min A mild complex  Pain  n/a  Safety/Judgment  manage safety with min assist   Therapy Plan: PT Intensity: Minimum of 1-2 x/day ,45 to 90 minutes PT Frequency: 5 out of 7 days PT Duration Estimated Length of Stay: ~3 weeks OT Intensity: Minimum of 1-2 x/day, 45 to 90 minutes OT Frequency: 5 out of 7 days OT Duration/Estimated Length of Stay:  18-22 days SLP Intensity: Minumum of 1-2 x/day, 30 to 90 minutes SLP Frequency: 3 to 5 out of 7 days SLP Duration/Estimated Length of Stay: 3 weeks   Due to the current state of emergency, patients may not be receiving their 3-hours of Medicare-mandated therapy.   Team Interventions: Nursing Interventions Patient/Family Education, Bladder Management, Bowel Management, Disease Management/Prevention, Medication Management, Skin Care/Wound Management, Discharge Planning, Dysphagia/Aspiration Precaution Training  PT interventions Ambulation/gait training, Discharge planning, Functional mobility training, Psychosocial support, Therapeutic Activities, Visual/perceptual remediation/compensation, Balance/vestibular training, Disease management/prevention, Skin care/wound management, Neuromuscular re-education, Wheelchair propulsion/positioning, Therapeutic Exercise, Cognitive remediation/compensation, DME/adaptive equipment instruction, Pain management, Splinting/orthotics, UE/LE Strength taining/ROM, Community reintegration, Development worker, international aid stimulation, Patient/family education, Museum/gallery curator, UE/LE Coordination activities  OT Interventions Warden/ranger, Disease mangement/prevention, Neuromuscular re-education, Self Care/advanced ADL retraining, Therapeutic Exercise, Wheelchair propulsion/positioning, Cognitive remediation/compensation, DME/adaptive equipment instruction, Pain management, Skin care/wound managment, UE/LE Strength taining/ROM, Community reintegration, Development worker, international aid stimulation, Patient/family education, Splinting/orthotics, UE/LE Coordination activities, Discharge planning, Functional mobility training, Psychosocial support, Therapeutic Activities, Visual/perceptual remediation/compensation  SLP Interventions Cognitive remediation/compensation, Functional tasks, Internal/external aids, Cueing hierarchy, Patient/family education, Medication managment, Multimodal  communication approach, Environmental controls, Dysphagia/aspiration precaution training, Speech/Language facilitation  TR Interventions    SW/CM Interventions Discharge Planning, Psychosocial Support, Patient/Family Education   Barriers to Discharge MD  Medical stability, Behavior  Nursing      PT Decreased caregiver support, Incontinence, Lack of/limited family support, Weight home alone with adult children providing supervision on shifts (?)  OT      SLP Other (comments) N/A  SW       Team Discharge Planning: Destination: PT-Home ,OT- Home , SLP-Home Projected  Follow-up: PT-Home health PT, Outpatient PT, 24 hour supervision/assistance, OT-  Home health OT, SLP-Home Health SLP, 24 hour supervision/assistance Projected Equipment Needs: PT-To be determined, OT-  , SLP-None recommended by SLP Equipment Details: PT-patient owns rollator and hurry-cane, OT-  Patient/family involved in discharge planning: PT- Patient, Family member/caregiver,  OT-Patient, Family member/caregiver, SLP-Patient, Family member/caregiver  MD ELOS: 18-22 days S/MinA Medical Rehab Prognosis:  Excellent Assessment: [name] [name] is admitted to CIR with right sided hemiparesis with inattention, cognitive deficits decreased initiation and delayed processing, dysphagia as well as expressive deficits affecting ability to carry out ADLs and mobility secondary to acute left thalamic and basal ganglia hemorrhage.  Course has been complicated medically by HTN (we are monitoring her BP TID and adjusting medications as necessary), sundowning (have added prn Seroquel, lavender drops, and requested son to bring in family pictures), leukocytosis (monitoring CBGs weekly), T2 DM with hyperglycemia (monitoring CBGs AC/HS).    See Team Conference Notes for weekly updates to the plan of care

## 2020-01-19 NOTE — Progress Notes (Signed)
Speech Language Pathology Daily Session Note  Patient Details  Name: [name] [name] MRN: [id number] Date of Birth: 10-23-1932  Today's Date: 01/19/2020 SLP Individual Time: 0902-1000 SLP Individual Time Calculation (min): 58 min  Short Term Goals: Week 1: SLP Short Term Goal 1 (Week 1): Patient will consume trials of Dys 3 solids without overt coughing/throat clearing and with full clearance of oral cavity, with SLP only, with minA. SLP Short Term Goal 2 (Week 1): Patient will demonstrate orientation to time/place/situation and recall recent daily events with minA cues for use of memory strategies as needed. SLP Short Term Goal 3 (Week 1): Patient will be able to achieve 85% intelligibilty at conversational level with SLP during semi-structured conversations, with modA cues. SLP Short Term Goal 4 (Week 1): Patient will perform basic level functional problem solving tasks with modA cues for accuracy. SLP Short Term Goal 5 (Week 1): Patient will demonstrate awareness to both deficits and progress towards goals during structured discussion with SLP, with modA cues.  Skilled Therapeutic Interventions:Skilled ST services focused on cognitive skills. SLP provided education to son pertaining to dys 2 textures, giving handout. SLP facilitated basic problem solving skills in function ALFA money management task pt required mod A verbal cues and max A verbal cues to complete mildly complex medication management task. Pt demonstrated vague intellectual awareness of deficits, but upon reflect of task was unable to note difficulties. Pt required mod A verbal cues to scan right of midline in ALFA tasks. Pt was able to participate in entire session, however required continuous encouragement due to fatigue. Pt was left in room with call bell within reach and chair alarm set. SLP recommends to continue skilled services.     Pain Pain Assessment Pain Score: 0-No pain  Therapy/Group: Individual  Therapy  Nikolaos Maddocks  [facility] 01/19/2020, 4:06 PM

## 2020-01-19 NOTE — Significant Event (Signed)
Hypoglycemic Event  CBG: 53  Treatment: Glucose 15 oral gel  Symptoms: sweating, groggy,   Follow-up CBG: [id number] CBG Result 69  Possible Reasons for Event: unknown  Comments/MD notified: Malissa Hippo., PA    Luetta Nutting

## 2020-01-19 NOTE — Progress Notes (Signed)
Inpatient Rehabilitation  Patient information reviewed and entered into eRehab system by Hani Campusano M. Deshun Sedivy, M.A., CCC/SLP, PPS Coordinator.  Information including medical coding, functional ability and quality indicators will be reviewed and updated through discharge.    

## 2020-01-19 NOTE — Progress Notes (Signed)
Patient Details  Name: [name] [name] MRN: [id number] Date of Birth: 1932/03/20  Today's Date: 01/19/2020  Hospital Problems: Principal Problem:   Nontraumatic acute hemorrhage of basal ganglia [facility])  Past Medical History:  Past Medical History:  Diagnosis Date  . Colon polyp   . Diabetes mellitus 1992  . Hypertension 1996  . Pancreatitis    Past Surgical History:  Past Surgical History:  Procedure Laterality Date  . CHOLECYSTECTOMY    . COLONOSCOPY    . ERCP    . PARTIAL HYSTERECTOMY  86years old  . POLYPECTOMY    . UPPER GASTROINTESTINAL ENDOSCOPY    . WRIST FRACTURE SURGERY  left arm,86years old   Social History:  reports that she has never smoked. She has never used smokeless tobacco. She reports that she does not drink alcohol and does not use drugs.  Family / Support Systems Marital Status: Widow/Widower Patient Roles: Parent, Other (Comment) (church member) Children: Levada Schilling [phone]  Pam-daughter [phone]-home  [phone] Other Supports: [name]-daughter and Surveyor, minerals Anticipated Caregiver: All four children and their spouses Ability/Limitations of Caregiver: Will work on 24/7 care plan Caregiver Availability: 24/7 (All four children coming up with a plan) Family Dynamics: Close knit family who will be there for one another. Pt has been extremely independent and wants to get back to this. All four children are local and get along well with each other  Social History Preferred language: English Religion: Protestant Cultural Background: No issues Education: HS Read: Yes Write: Yes Employment Status: Retired Marine scientist Issues: No issues Guardian/Conservator: Risk manager MD feels at this time pt is not fully capable of making her own decisions. Will look toward Link Snuffer and [name] to make decisions while here. [name] reports he and [name] communicate with the two others   Abuse/Neglect Abuse/Neglect Assessment Can Be Completed:  Yes Physical Abuse: Denies Verbal Abuse: Denies Sexual Abuse: Denies Exploitation of patient/patient's resources: Denies Self-Neglect: Denies  Emotional Status Pt's affect, behavior and adjustment status: Pt is motivated to do well here as well her family. Pt was living alone and fully independent and driving prior to this admission. She will do her best while here but is quite sleepy and deconditioned Recent Psychosocial Issues: Other health issues were managed and she thought she was healthy Psychiatric History: No issues/history deferred depression screening until she is more awake but when she is awake she expressed her concerns and goals. Do feel while here would benefit from seeing neruo-psych while here Substance Abuse History: None  Patient / Family Perceptions, Expectations & Goals Pt/Family understanding of illness & functional limitations: Pt has a basic understanding of what has happened to her. [name]-son and other children are rotating and talk with the MD daily and feel they have a good understanding of her treatment plan going froward. Premorbid pt/family roles/activities: Mom, grandmother, church member, home owner, etc Anticipated changes in roles/activities/participation: resume Pt/family expectations/goals: Pt states: " I want to get well and go home for Christmas.  [name] states: " We want her home for Christams so we all can be together."  [employer]: None Premorbid Home Care/DME Agencies: Other (Comment) (ramped entry into home) Transportation available at discharge: Self children will provide this now Resource referrals recommended: Neuropsychology  Discharge Planning Living Arrangements: Alone Support Systems: Children, Other relatives, Friends/neighbors, Church/faith community Type of Residence: Private residence Insurance Resources: Media planner (specify) Investment banker, operational) Financial Resources: Restaurant manager, fast food Screen  Referred: No Living Expenses: Own Money Management: Patient Does the patient  have any problems obtaining your medications?: No Home Management: Self-family will provide now Patient/Family Preliminary Plans: Return home with children assisting with her care. Made son-[name] aware she will require 24/7 physical care at discharge. He wants to wait and see since it is early. He voiced one of them will be here daily and talk with therapy team. Encouraged him to begin the converstaion now regarding who can assist at discharge. Care Coordinator Anticipated Follow Up Needs: HH/OP  Clinical Impression Pleasant female who can answer questions when awake, she expresses her desire to be home by Christmas and be as independent as possible like she was prior to admission. Her son-eddie is present and supportive and reports all four of them and their families will assist her at discharge. Made aware she will require 24/7 physical care at discharge from here. Will continue to work on safe discharge plan and provide education with family as team deems appropriate.  Lucy Chris 01/19/2020, 10:57 AM

## 2020-01-19 NOTE — Progress Notes (Signed)
[facility] PHYSICAL MEDICINE & REHABILITATION PROGRESS NOTE   Subjective/Complaints: As per son, patient gets anxious at night. She did receive sleep aide at night- no notes of agitation in nursing notes so this may have helped. Discussed with son adding prn Seroquel for anxiety at night, lavender drops to help calm her.   Review of systems no shortness of breath or coughing no abdominal pain or nausea  Objective:   VAS [place] LOWER EXTREMITY VENOUS (DVT)  Result Date: 01/19/2020  Lower Venous DVT Study Indications: Stroke, and immobility.  Comparison Study: no prior Performing Technologist: Blanch Media RVS  Examination Guidelines: A complete evaluation includes B-mode imaging, spectral Doppler, color Doppler, and power Doppler as needed of all accessible portions of each vessel. Bilateral testing is considered an integral part of a complete examination. Limited examinations for reoccurring indications may be performed as noted. The reflux portion of the exam is performed with the patient in reverse Trendelenburg.  +---------+---------------+---------+-----------+----------+--------------+ RIGHT    CompressibilityPhasicitySpontaneityPropertiesThrombus Aging +---------+---------------+---------+-----------+----------+--------------+ CFV      Full           Yes      Yes                                 +---------+---------------+---------+-----------+----------+--------------+ SFJ      Full                                                        +---------+---------------+---------+-----------+----------+--------------+ FV Prox  Full                                                        +---------+---------------+---------+-----------+----------+--------------+ FV Mid   Full                                                        +---------+---------------+---------+-----------+----------+--------------+ FV DistalFull                                                         +---------+---------------+---------+-----------+----------+--------------+ PFV      Full                                                        +---------+---------------+---------+-----------+----------+--------------+ POP      Full           Yes      Yes                                 +---------+---------------+---------+-----------+----------+--------------+ PTV      Full                                                        +---------+---------------+---------+-----------+----------+--------------+  PERO     Full                                                        +---------+---------------+---------+-----------+----------+--------------+   +---------+---------------+---------+-----------+----------+--------------+ LEFT     CompressibilityPhasicitySpontaneityPropertiesThrombus Aging +---------+---------------+---------+-----------+----------+--------------+ CFV      Full           Yes      Yes                                 +---------+---------------+---------+-----------+----------+--------------+ SFJ      Full                                                        +---------+---------------+---------+-----------+----------+--------------+ FV Prox  Full                                                        +---------+---------------+---------+-----------+----------+--------------+ FV Mid   Full                                                        +---------+---------------+---------+-----------+----------+--------------+ FV DistalFull                                                        +---------+---------------+---------+-----------+----------+--------------+ PFV      Full                                                        +---------+---------------+---------+-----------+----------+--------------+ POP      Full           Yes      Yes                                  +---------+---------------+---------+-----------+----------+--------------+ PTV      Full                                                        +---------+---------------+---------+-----------+----------+--------------+ PERO     Full                                                        +---------+---------------+---------+-----------+----------+--------------+  Summary: BILATERAL: - No evidence of deep vein thrombosis seen in the lower extremities, bilaterally. - No evidence of superficial venous thrombosis in the lower extremities, bilaterally. -No evidence of popliteal cyst, bilaterally.   *See table(s) above for measurements and observations.    Preliminary    Recent Labs    01/17/20 0121  WBC 11.0*  HGB 12.2  HCT 38.3  PLT 252   Recent Labs    01/17/20 0121  NA 139  K 4.0  CL 101  CO2 27  GLUCOSE 164*  BUN 71*  CREATININE 1.60*  CALCIUM 8.4*    Intake/Output Summary (Last 24 hours) at 01/19/2020 1150 Last data filed at 01/19/2020 1050 Gross per 24 hour  Intake 360 ml  Output --  Net 360 ml        Physical Exam: Vital Signs Blood pressure (!) 154/63, pulse 65, temperature (!) 97.4 F (36.3 C), resp. rate 16, height 5\' 3"  (1.6 m), weight 101.3 kg, SpO2 100 %. Gen: no distress, normal appearing HEENT: oral mucosa pink and moist, NCAT Cardio: Reg rate Chest: normal effort, normal rate of breathing Abd: soft, non-distended Ext: no edema Skin: intact Psych: pleasant, normal affect Neurologic: Cranial nerves II through XII intact, motor strength is 5/5 in Left 0/5 RIgh t  deltoid, bicep, tricep, grip,5/5 left and 4/5 RIght  hip flexor, knee extensors, ankle dorsiflexor and plantar flexor Sensory exam normal sensation to light touch and proprioception in bilateral upper and lower extremities Cerebellar exam normal finger to nose to finger as well as heel to shin in bilateral upper and lower extremities Musculoskeletal: Full range of motion in all 4  extremities. No joint swelling  Assessment/Plan: 1. Functional deficits which require 3+ hours per day of interdisciplinary therapy in a comprehensive inpatient rehab setting.  Physiatrist is providing close team supervision and 24 hour management of active medical problems listed below.  Physiatrist and rehab team continue to assess barriers to discharge/monitor patient progress toward functional and medical goals  Care Tool:  Bathing    Body parts bathed by patient: Chest, Abdomen, Right upper leg, Left upper leg, Face   Body parts bathed by helper: Right arm, Left arm, Front perineal area, Buttocks, Right lower leg, Left lower leg     Bathing assist Assist Level: Moderate Assistance - Patient 50 - 74%     Upper Body Dressing/Undressing Upper body dressing   What is the patient wearing?: Pull over shirt    Upper body assist Assist Level: Maximal Assistance - Patient 25 - 49%    Lower Body Dressing/Undressing Lower body dressing      What is the patient wearing?: Incontinence brief     Lower body assist Assist for lower body dressing: 2 Helpers     Toileting Toileting Toileting Activity did not occur (Clothing management and hygiene only): N/A (no void or bm)  Toileting assist Assist for toileting: 2 Helpers     Transfers Chair/bed transfer  Transfers assist     Chair/bed transfer assist level: Total Assistance - Patient < 25%     Locomotion Ambulation   Ambulation assist   Ambulation activity did not occur: Safety/medical concerns  Assist level: 2 helpers Assistive device: Parallel bars Max distance: 8   Walk 10 feet activity   Assist  Walk 10 feet activity did not occur: Safety/medical concerns (due patient fatigue/weakness)        Walk 50 feet activity   Assist Walk 50 feet with 2 turns activity did not occur:  Safety/medical concerns (due patient fatigue/weakness)         Walk 150 feet activity   Assist Walk 150 feet activity  did not occur: Safety/medical concerns (due patient fatigue/weakness)         Walk 10 feet on uneven surface  activity   Assist Walk 10 feet on uneven surfaces activity did not occur: Safety/medical concerns (due patient fatigue/weakness)         Wheelchair     Assist Will patient use wheelchair at discharge?: Yes Type of Wheelchair: Manual    Wheelchair assist level: Maximal Assistance - Patient 25 - 49% Max wheelchair distance: 25    Wheelchair 50 feet with 2 turns activity    Assist        Assist Level: Total Assistance - Patient < 25%   Wheelchair 150 feet activity     Assist      Assist Level: Total Assistance - Patient < 25%   Blood pressure (!) 154/63, pulse 65, temperature (!) 97.4 F (36.3 C), resp. rate 16, height 5\' 3"  (1.6 m), weight 101.3 kg, SpO2 100 %.  Medical Problem List and Plan: 1. 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             -patient may shower,son who is  health care POA wants full code, pt concurs             -ELOS/Goals: 18-22 days/Supervision/Min A  Continue CIR PT< OT, SLP 2. Antithrombotics: -DVT/anticoagulation:Pharmaceutical:Heparin -antiplatelet therapy: N/a 3. Pain Management:Tylenol as needed. Denies pain.  4. Mood:LCSW to follow for evaluation and support when appropriate. -antipsychotic agents: N/A  11/22: son notes anxiety at night. Received Trazodone last night which may have helped her sleep better. Son requests anti-anxiety medication if needed: added seroquel prn HS 25mg . Requested son to put pictures of family at foot of bed and bedside table and he agrees. Placed nursing order for lavender drops on forehead at night.  5. Neuropsych: This patientis notcapable of  making decisions on herown behalf. 6. Skin/Wound Care:Routine pressure-relief measures. Maintain adequate nutrition and hydration status. 7. Fluids/Electrolytes/Nutrition:Monitor intake/output--will order calorie count as cortak removed today. Will need assistance with feeding as due to fluctuating bouts of lethargy.  8. HTN: Monitor BP TID--continue Lasix, Hydralazine, Metoprolol, HCTZ,              Vitals:   01/19/20 0522 01/19/20 0820  BP: (!) 152/46 (!) 154/63  Pulse: 62 65  Resp: 16   Temp: (!) 97.4 F (36.3 C)   SpO2: 93% 100%  11/22: elevated. Has been labile- continue to monitor.  9. Acute on chronic renal failure: BUN/SCr 34/1.5 at admission -->73/1.61--Question due to multiple diuretics on board. Also has elevated phos/Mg levels-->repeat labs ordered.   Pt would like to try increasing po liquid drank ensure and coffee this am per son, will reduce lasix to 10mg  Gr 2 diastolic dysfunction 10. PNA?/Tracheobronchitis: Completed course of antibiotics on  11. Leukocytosis: Continue to monitor for signs of infection.              CBC ordered 12. T2DM with hyperglycemia:  Monitor BS ac/hs. Continue Lantus 50 units bid--d/c meal coverage as tube feeds d/c.    CBG (last 3)  Recent Labs    01/19/20 0638 01/19/20 0702 01/19/20 0733  GLUCAP 69* 74 120*  Low am CBG reduce lantus to 48U  11/22: low AM CBG. Changed ISS to sensitive.  13. Post stroke dysphagia :             D2 thins, advance diet as tolerated.     LOS: 3 days A FACE TO FACE EVALUATION WAS PERFORMED  Drema Pry Stefanos Haynesworth 01/19/2020, 11:50 AM

## 2020-01-20 ENCOUNTER — Inpatient Hospital Stay (HOSPITAL_COMMUNITY): Payer: Medicare Other

## 2020-01-20 ENCOUNTER — Inpatient Hospital Stay (HOSPITAL_COMMUNITY): Payer: Medicare Other | Admitting: Occupational Therapy

## 2020-01-20 DIAGNOSIS — R7309 Other abnormal glucose: Secondary | ICD-10-CM | POA: Diagnosis not present

## 2020-01-20 DIAGNOSIS — I619 Nontraumatic intracerebral hemorrhage, unspecified: Secondary | ICD-10-CM | POA: Diagnosis not present

## 2020-01-20 DIAGNOSIS — N189 Chronic kidney disease, unspecified: Secondary | ICD-10-CM

## 2020-01-20 DIAGNOSIS — E875 Hyperkalemia: Secondary | ICD-10-CM | POA: Diagnosis not present

## 2020-01-20 DIAGNOSIS — E1165 Type 2 diabetes mellitus with hyperglycemia: Secondary | ICD-10-CM

## 2020-01-20 DIAGNOSIS — F411 Generalized anxiety disorder: Secondary | ICD-10-CM

## 2020-01-20 DIAGNOSIS — N179 Acute kidney failure, unspecified: Secondary | ICD-10-CM

## 2020-01-20 DIAGNOSIS — I10 Essential (primary) hypertension: Secondary | ICD-10-CM

## 2020-01-20 LAB — POTASSIUM: Potassium: 3.7 mmol/L (ref 3.5–5.1)

## 2020-01-20 LAB — GLUCOSE, CAPILLARY
Glucose-Capillary: 164 mg/dL — ABNORMAL HIGH (ref 70–99)
Glucose-Capillary: 79 mg/dL (ref 70–99)
Glucose-Capillary: 104 mg/dL — ABNORMAL HIGH (ref 70–99)
Glucose-Capillary: 44 mg/dL — CL (ref 70–99)
Glucose-Capillary: 46 mg/dL — ABNORMAL LOW (ref 70–99)
Glucose-Capillary: 167 mg/dL — ABNORMAL HIGH (ref 70–99)
Glucose-Capillary: 110 mg/dL — ABNORMAL HIGH (ref 70–99)
Glucose-Capillary: 120 mg/dL — ABNORMAL HIGH (ref 70–99)
Glucose-Capillary: 85 mg/dL (ref 70–99)

## 2020-01-20 MED ORDER — INSULIN GLARGINE 100 UNIT/ML ~~LOC~~ SOLN
35.0000 [IU] | Freq: Two times a day (BID) | SUBCUTANEOUS | Status: DC
  Administered 2020-01-20 – 2020-01-21 (×2): 35 [IU] via SUBCUTANEOUS
  Filled 2020-01-20 (×3): qty 0.35

## 2020-01-20 NOTE — Progress Notes (Signed)
Physical Therapy Session Note  Patient Details  Name: [name] [name] MRN: [id number] Date of Birth: 04-Oct-1932  Today's Date: 01/20/2020 PT Individual Time: 0800-0855 PT Individual Time Calculation (min): 55 min   Short Term Goals: Week 1:  PT Short Term Goal 1 (Week 1): Patient will roll B with no more than CGA and use of bed rails as needed PT Short Term Goal 2 (Week 1): Patient will transition supine > sit with no more than ModA x1 consistently PT Short Term Goal 3 (Week 1): Patient will transfer bed <> wc with no more than MaxA x1 at least 50% of the time with LRAD PT Short Term Goal 4 (Week 1): Patient will ambulate >50ft with +2 assist and LRAD  Skilled Therapeutic Interventions/Progress Updates:    Pt received supine in bed, daughter [name] at bedside, pt agreeable to PT session. RN also present in room administering morning medications. Pt denies any pain but reports poor night's sleep. Pt reports frustration with breakfast (cold) and decreased appetite. She also reports general frustration with debility and decline in functional status since her stroke. She required frequent words of encouragement during our session for positive thoughts and focusing on small goals to achieve her long term goals. Performed supine<>sit with min/modA with HOB flat, use of bed rail. Able to sit EOB with close supervision while unsupported. Performed squat<>pivot transfer with maxA from EOB to w/c with cues for proper hand placement and setup. Wheeled with totalA for energy conservation from her room to main therapy gym. Focused remainder of session on gait training in // bars. She required mod/maxA for standing in // bars and ambulated the length of bars once with heavy modA with w/c follow for safety. She required assist for R foot advancement, R knee stabilization to prevent buckling, lateral weight shifts to promote step initiation, and trunk stability in order to reduce her forward flexion. Pt required  extended seated rest breaks due to fatigue during functional gait training. Focused on pre-gait training in // bars with stepping forward/backward with LLE with R knee block and also forward/backwards stepping with RLE with therapist assist for facilitation. Again, requiring frequent rest breaks due to fatigue. Returned to her room in the w/c where she remained seated in chair with safety belt alarm on, needs in reach, daughter at bedside. Informed daughter of pt's mobility during session.   Therapy Documentation Precautions:  Precautions Precautions: Fall Precaution Comments: R-hemi; watch for subluxation Restrictions Weight Bearing Restrictions: No  Therapy/Group: Individual Therapy  Joanna Borawski P Kemper Hochman PT 01/20/2020, 8:55 AM

## 2020-01-20 NOTE — Progress Notes (Signed)
Occupational Therapy Session Note  Patient Details  Name: [name] [name] MRN: [id number] Date of Birth: 03/14/1932  Today's Date: 01/20/2020 OT Individual Time: 1000-1110 OT Individual Time Calculation (min): 70 min    Short Term Goals: Week 1:  OT Short Term Goal 1 (Week 1): Pt will complete toilet transfer with max assist. OT Short Term Goal 2 (Week 1): Pt will engage in self-care task in standing for 30 seconds with mod assist. OT Short Term Goal 3 (Week 1): Pt will don shirt with mod assist OT Short Term Goal 4 (Week 1): Pt will demonstrate dynamic sitting balance for 1 minute with mod asisst.  Skilled Therapeutic Interventions/Progress Updates:    Pt sitting up in w/c, c/o pain in lower abdomen, requesting to use bathroom. Pt completed sit<>stand at stedy with mod assist and CGA to transport to bathroom.  Sit<>stand at stedy with mod assist at toilet.  Pt had continent episode of bowel and bladder.  Pt reports pain relieved after toileting complete. Max assist needed for clothing mgt and pericare while pt stood at stedy with CGA for balance and LUE support (RUE unable to assist due to weakness).  Pt completed sit<>stand at w/c from stedy with mod assist in preperation for sinkside self care.  Pt doffed shirt with mod assist self initiating use of hemitechnique strategy that OT trained her in yesterday.  Pt bathed UB/LB with mod assist needing VCs and TCs  for leaning technique to bathe under right arm more independently.  Pt educated on hemitechnique to donn brief and pants by threading RLE first. Pt needed max assist to thread over BLE and to pull over hips standing at stedy with mod assist for sit<>stand.  Pt needed total assist to donn shoes due to pt unable to position BLE into figure 4 and limited reach.  Pt educated on hemitechniques to setup oral hygiene including opening/closing toothpaste, and applying to toothbrush.  Pt brushed teeth sitting in w/c with supervision.  Pt may benefit  from training in use of AE including long handled sponge and reacher to increase independence during bathing and dressing.  Call bell in reach, seat belt alarm on.  Therapy Documentation Precautions:  Precautions Precautions: Fall Precaution Comments: R-hemi; watch for subluxation Restrictions Weight Bearing Restrictions: No   Therapy/Group: Individual Therapy  Amie Critchley 01/20/2020, 11:42 AM

## 2020-01-20 NOTE — Progress Notes (Signed)
Speech Language Pathology Daily Session Note  Patient Details  Name: [name] [name] MRN: [id number] Date of Birth: [birth year]  Today's Date: 01/20/2020 SLP Individual Time: 1452-1530  And 1135- 1350 SLP Individual Time Calculation (min): 38 min and 15 min  Short Term Goals: Week 1: SLP Short Term Goal 1 (Week 1): Patient will consume trials of Dys 3 solids without overt coughing/throat clearing and with full clearance of oral cavity, with SLP only, with minA. SLP Short Term Goal 2 (Week 1): Patient will demonstrate orientation to time/place/situation and recall recent daily events with minA cues for use of memory strategies as needed. SLP Short Term Goal 3 (Week 1): Patient will be able to achieve 85% intelligibilty at conversational level with SLP during semi-structured conversations, with modA cues. SLP Short Term Goal 4 (Week 1): Patient will perform basic level functional problem solving tasks with modA cues for accuracy. SLP Short Term Goal 5 (Week 1): Patient will demonstrate awareness to both deficits and progress towards goals during structured discussion with SLP, with modA cues.  Skilled Therapeutic Interventions: #1 Skilled ST services focused on cognitive skills. Pt's daughter was present initially and stated increase fatigue due to recent blood sugar changes. Pt's daughter left the room. SLP attempted to increase alertness with offering cold beverage, compress and repositioning in recliner chair, but was only able to open eyes for 1-2 seconds. Treatment session was ended early due to fatigue. Pt was left in room with call bell within reach and chair alarm set. SLP recommends to continue skilled services.  #2 Skilled ST services focused on cognitive skills. Pt was alert and appeared upset, tearing up that's daughter had left and thought she was mad at her. SLP explained why she left, to get lunch and clothes and that she would return. Pt appeared to understand. SLP facilitated basic  problem solving, recall and sustained attention in basic card task Blink, pt required max A verbal cues. SLP adjusted task to sorting cards by 6 colors with various shapes/numbers of shapes present. Pt demonstrated sustained attention in 3-5 minute intervals and mod A verbal cues to recall method of sorting. Pt demonstrated increase ability to locate cards on the right fading to min A verbal cues. Pt was left in room with call bell within reach and chair alarm set. SLP recommends to continue skilled services.     Pain Pain Assessment Pain Score: 0-No pain  Therapy/Group: Individual Therapy  Naveah Brave  [facility] 01/20/2020, 3:53 PM

## 2020-01-20 NOTE — Progress Notes (Signed)
Hypoglycemic Event  CBG: 44 at 1146  Treatment: 8oz of juice  Symptoms: lightheadedness.   Follow-up CBG: Time: 1219 CBG Result: 104  Possible Reasons for Event: Unknown.   Comments/MD notified: Pam, PA aware. Lantus adjusted.     Jovonne Wilton [facility] [place]

## 2020-01-20 NOTE — Progress Notes (Signed)
[place] PHYSICAL MEDICINE & REHABILITATION PROGRESS NOTE   Subjective/Complaints: Patient seen sitting up in bed this morning.  No reported issues overnight.  Review of systems: Denies CP, SOB, N/V/D  Objective:   VAS [place] LOWER EXTREMITY VENOUS (DVT)  Result Date: 01/19/2020  Lower Venous DVT Study Indications: Stroke, and immobility.  Comparison Study: no prior Performing Technologist: Blanch Media RVS  Examination Guidelines: A complete evaluation includes B-mode imaging, spectral Doppler, color Doppler, and power Doppler as needed of all accessible portions of each vessel. Bilateral testing is considered an integral part of a complete examination. Limited examinations for reoccurring indications may be performed as noted. The reflux portion of the exam is performed with the patient in reverse Trendelenburg.  +---------+---------------+---------+-----------+----------+--------------+ RIGHT    CompressibilityPhasicitySpontaneityPropertiesThrombus Aging +---------+---------------+---------+-----------+----------+--------------+ CFV      Full           Yes      Yes                                 +---------+---------------+---------+-----------+----------+--------------+ SFJ      Full                                                        +---------+---------------+---------+-----------+----------+--------------+ FV Prox  Full                                                        +---------+---------------+---------+-----------+----------+--------------+ FV Mid   Full                                                        +---------+---------------+---------+-----------+----------+--------------+ FV DistalFull                                                        +---------+---------------+---------+-----------+----------+--------------+ PFV      Full                                                         +---------+---------------+---------+-----------+----------+--------------+ POP      Full           Yes      Yes                                 +---------+---------------+---------+-----------+----------+--------------+ PTV      Full                                                        +---------+---------------+---------+-----------+----------+--------------+  PERO     Full                                                        +---------+---------------+---------+-----------+----------+--------------+   +---------+---------------+---------+-----------+----------+--------------+ LEFT     CompressibilityPhasicitySpontaneityPropertiesThrombus Aging +---------+---------------+---------+-----------+----------+--------------+ CFV      Full           Yes      Yes                                 +---------+---------------+---------+-----------+----------+--------------+ SFJ      Full                                                        +---------+---------------+---------+-----------+----------+--------------+ FV Prox  Full                                                        +---------+---------------+---------+-----------+----------+--------------+ FV Mid   Full                                                        +---------+---------------+---------+-----------+----------+--------------+ FV DistalFull                                                        +---------+---------------+---------+-----------+----------+--------------+ PFV      Full                                                        +---------+---------------+---------+-----------+----------+--------------+ POP      Full           Yes      Yes                                 +---------+---------------+---------+-----------+----------+--------------+ PTV      Full                                                         +---------+---------------+---------+-----------+----------+--------------+ PERO     Full                                                        +---------+---------------+---------+-----------+----------+--------------+  Summary: BILATERAL: - No evidence of deep vein thrombosis seen in the lower extremities, bilaterally. - No evidence of superficial venous thrombosis in the lower extremities, bilaterally. -No evidence of popliteal cyst, bilaterally.   *See table(s) above for measurements and observations. Electronically signed by Sherald Hess MD on 01/19/2020 at 4:44:11 PM.    Final    Recent Labs    01/19/20 1305  WBC 9.8  HGB 13.1  HCT 41.0  PLT 321   Recent Labs    01/19/20 1305  NA 136  K 5.4*  CL 97*  CO2 26  GLUCOSE 323*  BUN 49*  CREATININE 1.49*  CALCIUM 8.6*    Intake/Output Summary (Last 24 hours) at 01/20/2020 0912 Last data filed at 01/19/2020 1050 Gross per 24 hour  Intake 120 ml  Output --  Net 120 ml        Physical Exam: Vital Signs Blood pressure (!) 154/49, pulse 64, temperature 97.6 F (36.4 C), temperature source Oral, resp. rate 18, height 5\' 3"  (1.6 m), weight 101.3 kg, SpO2 97 %. Constitutional: No distress . Vital signs reviewed. HENT: Normocephalic.  Atraumatic. Eyes: EOMI. No discharge. Cardiovascular: No JVD.  RRR. Respiratory: Normal effort.  No stridor.  Bilateral clear to auscultation. GI: Non-distended.  BS +. Skin: Warm and dry.  Intact. Psych: Unable to assess due to mentation Musc: No edema in extremities.  No tenderness in extremities. Neuro: Somewhat lethargic Motor: Limited due to participation, however no movement noted in right upper extremity, some spontaneous movement noted in right lower extremity   Assessment/Plan: 1. Functional deficits which require 3+ hours per day of interdisciplinary therapy in a comprehensive inpatient rehab setting.  Physiatrist is providing close team supervision and 24 hour  management of active medical problems listed below.  Physiatrist and rehab team continue to assess barriers to discharge/monitor patient progress toward functional and medical goals  Care Tool:  Bathing    Body parts bathed by patient: Chest, Abdomen, Right upper leg, Left upper leg, Face   Body parts bathed by helper: Right arm, Left arm, Front perineal area, Buttocks, Right lower leg, Left lower leg     Bathing assist Assist Level: Moderate Assistance - Patient 50 - 74%     Upper Body Dressing/Undressing Upper body dressing   What is the patient wearing?: Pull over shirt    Upper body assist Assist Level: Maximal Assistance - Patient 25 - 49%    Lower Body Dressing/Undressing Lower body dressing      What is the patient wearing?: Incontinence brief     Lower body assist Assist for lower body dressing: 2 Helpers     Toileting Toileting Toileting Activity did not occur (Clothing management and hygiene only): N/A (no void or bm)  Toileting assist Assist for toileting: 2 Helpers     Transfers Chair/bed transfer  Transfers assist     Chair/bed transfer assist level: Maximal Assistance - Patient 25 - 49% (squat<>pivot)     Locomotion Ambulation   Ambulation assist   Ambulation activity did not occur: Safety/medical concerns  Assist level: 2 helpers Assistive device: Parallel bars Max distance: 8   Walk 10 feet activity   Assist  Walk 10 feet activity did not occur: Safety/medical concerns        Walk 50 feet activity   Assist Walk 50 feet with 2 turns activity did not occur: Safety/medical concerns         Walk 150 feet activity   Assist Walk 150 feet  activity did not occur: Safety/medical concerns         Walk 10 feet on uneven surface  activity   Assist Walk 10 feet on uneven surfaces activity did not occur: Safety/medical concerns         Wheelchair     Assist Will patient use wheelchair at discharge?: Yes Type of  Wheelchair: Manual    Wheelchair assist level: Maximal Assistance - Patient 25 - 49% Max wheelchair distance: 25    Wheelchair 50 feet with 2 turns activity    Assist        Assist Level: Total Assistance - Patient < 25%   Wheelchair 150 feet activity     Assist      Assist Level: Total Assistance - Patient < 25%   Blood pressure (!) 154/49, pulse 64, temperature 97.6 F (36.4 C), temperature source Oral, resp. rate 18, height 5\' 3"  (1.6 m), weight 101.3 kg, SpO2 97 %.  Medical Problem List and Plan: 1. 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            Continue CIR  2. Antithrombotics: -DVT/anticoagulation:Pharmaceutical:Heparin -antiplatelet therapy: N/a 3. Pain Management:Tylenol as needed. Denies pain.  4. Mood:LCSW to follow for evaluation and support when appropriate. -antipsychotic agents: N/A  Seroquel prn HS 25mg  per son.   Nursing order for lavender drops on forehead at night.  5. Neuropsych: This patientis notcapable of making decisions on herown behalf. 6. Skin/Wound Care:Routine pressure-relief measures. Maintain adequate nutrition and hydration status. 7. Fluids/Electrolytes/Nutrition:Monitor intake/output--  Will need assistance with feeding as due to fluctuating bouts of lethargy.  8. HTN: Monitor BP TID--continue Lasix, Hydralazine, Metoprolol, HCTZ,              Vitals:   01/19/20 2250 01/20/20 0530  BP: (!) 138/40 (!) 154/49  Pulse:  64  Resp:    Temp:  97.6 F (36.4 C)  SpO2:     Slightly elevated/labile on 11/23, monitor for trend 9. Acute on chronic renal failure: BUN/SCr 34/1.5 at admission.  Decreased lasix to 10mg   Creatinine 1.49 on 11/22, continue to monitor Gr 2 diastolic  dysfunction 10. PNA?/Tracheobronchitis: Completed course of antibiotics on  11. Leukocytosis: Continue to monitor for signs of infection.              CBC ordered 12. T2DM with hyperglycemia:  Monitor BS ac/hs.   CBG (last 3)  Recent Labs    01/19/20 2049 01/19/20 2324 01/20/20 0609  GLUCAP 223* 164* 79   Lantus decreased to 48U, decreased to 35 twice daily on 11/23  Changed ISS to sensitive.   Remains labile on 11/23 13. Post stroke dysphagia :             D2 thins, advance diet as tolerated. 14.  Hyperkalemia  Potassium 5.4 on 11/22, repeat labs ordered  LOS: 4 days A FACE TO FACE EVALUATION WAS PERFORMED  [name] [name] 12/23 01/20/2020, 9:12 AM

## 2020-01-20 NOTE — Progress Notes (Signed)
Daughter concerned that patient with low BS again and  did not get to eat breakfast due to therapy. She  needs extra times for meals--discussed having am therapy start around 9 am or later with scheduler. Lantus was decreased this am and should help too. Note labs drawn late yesterday--question accurate. Follow up labs show K+ low normal.

## 2020-01-21 ENCOUNTER — Inpatient Hospital Stay (HOSPITAL_COMMUNITY): Payer: Medicare Other

## 2020-01-21 ENCOUNTER — Inpatient Hospital Stay (HOSPITAL_COMMUNITY): Payer: Medicare Other | Admitting: Occupational Therapy

## 2020-01-21 DIAGNOSIS — I619 Nontraumatic intracerebral hemorrhage, unspecified: Secondary | ICD-10-CM | POA: Diagnosis not present

## 2020-01-21 DIAGNOSIS — R7309 Other abnormal glucose: Secondary | ICD-10-CM | POA: Diagnosis not present

## 2020-01-21 DIAGNOSIS — I69391 Dysphagia following cerebral infarction: Secondary | ICD-10-CM | POA: Diagnosis not present

## 2020-01-21 DIAGNOSIS — E162 Hypoglycemia, unspecified: Secondary | ICD-10-CM | POA: Diagnosis not present

## 2020-01-21 LAB — GLUCOSE, CAPILLARY
Glucose-Capillary: 56 mg/dL — ABNORMAL LOW (ref 70–99)
Glucose-Capillary: 115 mg/dL — ABNORMAL HIGH (ref 70–99)
Glucose-Capillary: 116 mg/dL — ABNORMAL HIGH (ref 70–99)
Glucose-Capillary: 146 mg/dL — ABNORMAL HIGH (ref 70–99)
Glucose-Capillary: 188 mg/dL — ABNORMAL HIGH (ref 70–99)

## 2020-01-21 MED ORDER — EXERCISE FOR HEART AND HEALTH BOOK
Freq: Once | Status: AC
  Filled 2020-01-21: qty 1

## 2020-01-21 MED ORDER — INSULIN GLARGINE 100 UNIT/ML ~~LOC~~ SOLN
30.0000 [IU] | Freq: Two times a day (BID) | SUBCUTANEOUS | Status: DC
  Filled 2020-01-21: qty 0.3

## 2020-01-21 MED ORDER — LIVING WELL WITH DIABETES BOOK
Freq: Once | Status: AC
  Filled 2020-01-21: qty 1

## 2020-01-21 MED ORDER — GLUCOSE 40 % PO GEL
ORAL | Status: AC
  Administered 2020-01-21: 37.5 g
  Filled 2020-01-21: qty 1

## 2020-01-21 MED ORDER — INSULIN GLARGINE 100 UNIT/ML ~~LOC~~ SOLN
28.0000 [IU] | Freq: Two times a day (BID) | SUBCUTANEOUS | Status: DC
  Administered 2020-01-21 – 2020-01-27 (×12): 28 [IU] via SUBCUTANEOUS
  Filled 2020-01-21 (×13): qty 0.28

## 2020-01-21 MED ORDER — CHLORTHALIDONE 25 MG PO TABS
25.0000 mg | ORAL_TABLET | Freq: Every day | ORAL | Status: DC
  Administered 2020-01-22 – 2020-02-02 (×12): 25 mg via ORAL
  Filled 2020-01-21 (×12): qty 1

## 2020-01-21 MED ORDER — GLUCERNA SHAKE PO LIQD
237.0000 mL | Freq: Every day | ORAL | Status: DC
  Administered 2020-01-21 – 2020-02-10 (×19): 237 mL via ORAL

## 2020-01-21 NOTE — Progress Notes (Signed)
Speech Language Pathology Daily Session Note  Patient Details  Name: [name] [name] MRN: [id number] Date of Birth: 14-Mar-1932  Today's Date: 01/21/2020 SLP Individual Time: 1300-1400 SLP Individual Time Calculation (min): 60 min  Short Term Goals: Week 1: SLP Short Term Goal 1 (Week 1): Patient will consume trials of Dys 3 solids without overt coughing/throat clearing and with full clearance of oral cavity, with SLP only, with minA. SLP Short Term Goal 2 (Week 1): Patient will demonstrate orientation to time/place/situation and recall recent daily events with minA cues for use of memory strategies as needed. SLP Short Term Goal 3 (Week 1): Patient will be able to achieve 85% intelligibilty at conversational level with SLP during semi-structured conversations, with modA cues. SLP Short Term Goal 4 (Week 1): Patient will perform basic level functional problem solving tasks with modA cues for accuracy. SLP Short Term Goal 5 (Week 1): Patient will demonstrate awareness to both deficits and progress towards goals during structured discussion with SLP, with modA cues.  Skilled Therapeutic Interventions: Pt seen for ongoing skilled tx of cognitive communication goals, pt pleasant and cooperative with all therapeutic tasks. Pt's daughter present for portion of tx, states pt is having a great day and this "is the first day she hasn't cried". Pt requiring re-education on  compensatory strategies for dysarthria (slow, loud, overarticulate and pace), able to utilize throughout session with min A verbal cues. Simple conversation ~85% intelligible this date with use of strategies.  Pt agreeable to participate in simple problem solving task, SLP facilitating task by providing min A advancing to max A verbal cues due to increasing fatigue with task. Pt ability to independently scan to the R also diminished 2' fatigue with task, scanning ability increased with verbal and visual cues. Dicussed self feeding  ability/safety with NT, other treating SLP and family, all in agreement that patient continues to require full supervision with PO intake however trained family members (versus staff only) can be supervisors when in room. Safety sheet in room updated with this information. Pt left in chair with legs elevated, chair alarm on and call light within reach. Cont ST POC.   Pain Pain Assessment Pain Scale: 0-10 Pain Score: 0-No pain  Therapy/Group: Individual Therapy  Tacey Ruiz 01/21/2020, 1:50 PM

## 2020-01-21 NOTE — Progress Notes (Signed)
Patient ID: [name] [name], female   DOB: 07/04/1932, 86 y.o.   MRN: [id number]  Spoke with daughter-Pam in the room and [name]-son on speaker to inform of team conference goals min assist level and target discharge date of 12/9. Family really wants her to reach supervision level and have read can contest discharge date once closer. Discussed lets see how she progress and go from there. Her main issues is also her severe deconditioning and the time it takes to get her strength back. Will continue to work on discharge needs. Family is here daily and observing in therapies.

## 2020-01-21 NOTE — Progress Notes (Signed)
Orthopedic Tech Progress Note Patient Details:  [name] [name] [birth year] [id number] Called in order to [facility] for a REHAB COMBO Patient ID: [name] [name], female   DOB: 03/19/1932, 86 y.o.   MRN: [id number]   [name] 01/21/2020, 9:38 AM

## 2020-01-21 NOTE — Progress Notes (Signed)
2223 Family (daughter) concern of CBG dropping in morning, provided support and educated on unit protocol. Chocolate pudding and orange juice offered to PT as night time snack. 100% consumed.   2223  C/O generalized pain, Administered Tylenol. X2

## 2020-01-21 NOTE — Patient Care Conference (Signed)
Inpatient RehabilitationTeam Conference and Plan of Care Update Date: 01/21/2020   Time: 11:23 AM    Patient Name: [name] [name]      Medical Record Number: [id number]  Date of Birth: 14-Mar-1932 Sex: Female         Room/Bed: 4W23C/4W23C-01 Payor Info: Payor: [employer]vertising copywriter MEDICARE / Plan: [employer] MEDICARE / Product Type: *No Product type* /    Admit Date/Time:  01/16/2020  4:29 PM  Primary Diagnosis:  Nontraumatic acute hemorrhage of basal ganglia [facility])  Hospital Problems: Principal Problem:   Nontraumatic acute hemorrhage of basal ganglia (HCC) Active Problems:   Controlled type 2 diabetes mellitus with hyperglycemia (HCC)   Labile blood glucose   Hyperkalemia   AKI (acute kidney injury) (HCC)   Anxiety state   Hypoglycemia    Expected Discharge Date: Expected Discharge Date: 02/05/20  Team Members Present: Physician leading conference: Dr. Maryla Morrow Care Coodinator Present: Chana Bode, RN, BSN, CRRN;Becky Dupree, LCSW Nurse Present: Other (comment) Dagoberto Reef, RN) PT Present: Wynelle Link, PT OT Present: Dolphus Jenny, OT SLP Present: Elio Forget, SLP PPS Coordinator present : Fae Pippin, SLP     Current Status/Progress Goal Weekly Team Focus  Bowel/Bladder   Continent of B/B LBM 01/21/2020  Remain continent  Toilet Q2hr   Swallow/Nutrition/ Hydration   Dys 2 textures and thin liquids, mod A  Mod I  carryover of swallow strategies and adavnced solid texture trials   ADL's   modA stedy transfers, max/totalA LB ADLs, mod/maxA UB bathing/dressing  min assist  self care training, functional transfers, hemitechnique training   Mobility   maxA bed mobility, max/totalA squat<>pivot, modA in [place], sitting balance CGA/minA  minA  functional transfers, improving activity tolerance as she is VERY deconditioned, pre-gait training, standing balance, R hemibody NMR   Communication   Mod A  Supervision A  following basic commands, expressing wants/needs  consistently   Safety/Cognition/ Behavioral Observations  Max-Mod A,  Min-Supervision A  basic problem solving, error awareness, sustained attention and recall strategies   Pain   C/O of generalized pain  Pain down to 2  Assesse Qshift and PRN   Skin   Skin intact  Maintain skin integrity  Assess Qshift and PRN     Discharge Planning:  Family plans on taking pt home and rotating her care amoung them. Someone is here daily to provide support and encouragement   Team Discussion: Hypoglycemic events even with diet and snacks; MD adjusting lantus dosage and added seroquel at HS for anxiety. Patient is continent of bowel and bladder. Progress impaired by anxiety and apathy. Patient needs lots of encouragement to participate and words of encouragement for progress as well as redirection for self care.  Patient on target to meet rehab goals: yes, min assist goals set. Currently mod assist to stand at the sink and mod assist to stand with the stedy and max assist-total assist for transfers.  *See Care Plan and progress notes for long and short-term goals.   Revisions to Treatment Plan:  Trials of dysphagia 3 solids.  Teaching Needs: Transfers, toileting, cues, medications, DM and secondary stroke prevention risk management, etc.  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  Barriers to Discharge: Behavior;Medical  stability;Other (comments)  Barriers to Discharge Comments: hypoglycemia Possible Resolutions to Barriers/Weekly Focus: Therapies, Follow labs - Cr, follow BP, optimize DM meds, advance diet as tolerated   Continued Need for Acute  Rehabilitation Level of Care: The patient requires daily medical management by a physician with specialized training in physical medicine and rehabilitation for the following reasons: Direction of a multidisciplinary physical rehabilitation program to maximize functional independence : Yes Medical management of patient stability for increased activity during participation in an intensive rehabilitation regime.: Yes Analysis of laboratory values and/or radiology reports with any subsequent need for medication adjustment and/or medical intervention. : Yes   I attest that I was present, lead the team conference, and concur with the assessment and plan of the team.   Chana Bode B 01/21/2020, 1:11 PM

## 2020-01-21 NOTE — Progress Notes (Signed)
Occupational Therapy Session Note  Patient Details  Name: [name] [name] MRN: [id number] Date of Birth: 1932/04/06  Today's Date: 01/21/2020 OT Individual Time: 8101-7510 OT Individual Time Calculation (min): 28 min    Short Term Goals: Week 1:  OT Short Term Goal 1 (Week 1): Pt will complete toilet transfer with max assist. OT Short Term Goal 2 (Week 1): Pt will engage in self-care task in standing for 30 seconds with mod assist. OT Short Term Goal 3 (Week 1): Pt will don shirt with mod assist OT Short Term Goal 4 (Week 1): Pt will demonstrate dynamic sitting balance for 1 minute with mod asisst.  Skilled Therapeutic Interventions/Progress Updates:    1;1. Pt received in EOB with daughter and NT present. Pt already dressed and ready to go with no pain reported. tp completes squat pivot transfer to L with MAX A overall and daughter steadying the w/c. Total A transport to gym for time amangement. OT changes K tape for facilitation of digit extension/edema on R hand. Arm skate in gravity elimited for elbow/shoulder flex/ext and int/ext rotation with improved activation (trace+) with flexion and extension (trace). Exited session with pt seated in w/c, daughter present to supervise and call light in reach   Therapy Documentation Precautions:  Precautions Precautions: Fall Precaution Comments: R-hemi; watch for subluxation Restrictions Weight Bearing Restrictions: No General:   Vital Signs: Therapy Vitals BP: (!) 153/51 Pain:   ADL:   Vision   Perception    Praxis   Exercises:   Other Treatments:     Therapy/Group: Individual Therapy  Shon Hale 01/21/2020, 9:14 AM

## 2020-01-21 NOTE — Progress Notes (Signed)
Occupational Therapy Session Note  Patient Details  Name: [name] [name] MRN: [id number] Date of Birth: 04/09/1932  Today's Date: 01/21/2020 OT Individual Time: 1003-1103 OT Individual Time Calculation (min): 60 min    Short Term Goals: Week 1:  OT Short Term Goal 1 (Week 1): Pt will complete toilet transfer with max assist. OT Short Term Goal 2 (Week 1): Pt will engage in self-care task in standing for 30 seconds with mod assist. OT Short Term Goal 3 (Week 1): Pt will don shirt with mod assist OT Short Term Goal 4 (Week 1): Pt will demonstrate dynamic sitting balance for 1 minute with mod asisst.  Skilled Therapeutic Interventions/Progress Updates:    Pt sitting up in w/c, no c/o pain. "Im so tired".  Pt reporting fatigue, but agreeable with encouragement to participate during OT session.  Pt completed sit<>stand at stedy with mod assist and transported to bathroom for shower bench.  Pt pulled brief and pants over hips to doff with min assist in standing and then completed stand to sit transfer with mod assist.  Pt doffed shirt overhead with min assist.  Pt doffed brief and pants over feet in sitting with min assist and step by step VCs for problem solving.  Pt bathed UB and LB with mod assist with improved dynamic sitting balance noted today and self initiating forward lean with CGA to wash lower BLE (not including feet). After drying off, pt completed sit to stand at stedy with mod assist and transported to sinkside with CGA.Marland Kitchen  Pt donned shirt needing re-education on hemitechnique for improved follow through, still requiring max assist to implement after education.  Pt donned brief and pants with mod assist including standing at sinkside with mod assist to pull over hips.  Total assist needed to donn socks.  Pt requesting to sit in recliner at end of session.  Mod assist sit<>stand at stedy w/c to recliner.  Call bell in reach, BLE elevated, seat belt alarm on.    Therapy  Documentation Precautions:  Precautions Precautions: Fall Precaution Comments: R-hemi; watch for subluxation Restrictions Weight Bearing Restrictions: No   Therapy/Group: Individual Therapy  Amie Critchley 01/21/2020, 12:14 PM

## 2020-01-21 NOTE — Significant Event (Signed)
Hypoglycemic Event  CBG: 56  Treatment: Orange juice, glucogel, protein yogurt.   Symptoms: Diaphoretic, slow to respond, weak    Follow-up CBG: Time: 0705 CBG Result: 115  Possible Reasons for Event: Unknown, Low blood sugar/symptomatic.  Comments/MD notified: Jacalyn Lefevre NP     Valarie Merino

## 2020-01-21 NOTE — Progress Notes (Signed)
[place] PHYSICAL MEDICINE & REHABILITATION PROGRESS NOTE   Subjective/Complaints: Patient seen sitting up in bed this morning.  She states she slept well overnight.  Daughter at bedside.  Patient with questions regarding DC IV.  Yesterday, and again today noted to be hypoglycemic.  Review of systems: Denies CP, SOB, N/V/D  Objective:   VAS [place] LOWER EXTREMITY VENOUS (DVT)  Result Date: 01/19/2020  Lower Venous DVT Study Indications: Stroke, and immobility.  Comparison Study: no prior Performing Technologist: Blanch Media RVS  Examination Guidelines: A complete evaluation includes B-mode imaging, spectral Doppler, color Doppler, and power Doppler as needed of all accessible portions of each vessel. Bilateral testing is considered an integral part of a complete examination. Limited examinations for reoccurring indications may be performed as noted. The reflux portion of the exam is performed with the patient in reverse Trendelenburg.  +---------+---------------+---------+-----------+----------+--------------+ RIGHT    CompressibilityPhasicitySpontaneityPropertiesThrombus Aging +---------+---------------+---------+-----------+----------+--------------+ CFV      Full           Yes      Yes                                 +---------+---------------+---------+-----------+----------+--------------+ SFJ      Full                                                        +---------+---------------+---------+-----------+----------+--------------+ FV Prox  Full                                                        +---------+---------------+---------+-----------+----------+--------------+ FV Mid   Full                                                        +---------+---------------+---------+-----------+----------+--------------+ FV DistalFull                                                         +---------+---------------+---------+-----------+----------+--------------+ PFV      Full                                                        +---------+---------------+---------+-----------+----------+--------------+ POP      Full           Yes      Yes                                 +---------+---------------+---------+-----------+----------+--------------+ PTV      Full                                                        +---------+---------------+---------+-----------+----------+--------------+  PERO     Full                                                        +---------+---------------+---------+-----------+----------+--------------+   +---------+---------------+---------+-----------+----------+--------------+ LEFT     CompressibilityPhasicitySpontaneityPropertiesThrombus Aging +---------+---------------+---------+-----------+----------+--------------+ CFV      Full           Yes      Yes                                 +---------+---------------+---------+-----------+----------+--------------+ SFJ      Full                                                        +---------+---------------+---------+-----------+----------+--------------+ FV Prox  Full                                                        +---------+---------------+---------+-----------+----------+--------------+ FV Mid   Full                                                        +---------+---------------+---------+-----------+----------+--------------+ FV DistalFull                                                        +---------+---------------+---------+-----------+----------+--------------+ PFV      Full                                                        +---------+---------------+---------+-----------+----------+--------------+ POP      Full           Yes      Yes                                  +---------+---------------+---------+-----------+----------+--------------+ PTV      Full                                                        +---------+---------------+---------+-----------+----------+--------------+ PERO     Full                                                        +---------+---------------+---------+-----------+----------+--------------+  Summary: BILATERAL: - No evidence of deep vein thrombosis seen in the lower extremities, bilaterally. - No evidence of superficial venous thrombosis in the lower extremities, bilaterally. -No evidence of popliteal cyst, bilaterally.   *See table(s) above for measurements and observations. Electronically signed by Sherald Hess MD on 01/19/2020 at 4:44:11 PM.    Final    Recent Labs    01/19/20 1305  WBC 9.8  HGB 13.1  HCT 41.0  PLT 321   Recent Labs    01/19/20 1305 01/20/20 0942  NA 136  --   K 5.4* 3.7  CL 97*  --   CO2 26  --   GLUCOSE 323*  --   BUN 49*  --   CREATININE 1.49*  --   CALCIUM 8.6*  --     Intake/Output Summary (Last 24 hours) at 01/21/2020 0915 Last data filed at 01/20/2020 1856 Gross per 24 hour  Intake 120 ml  Output --  Net 120 ml        Physical Exam: Vital Signs Blood pressure (!) 153/51, pulse 65, temperature 98 F (36.7 C), resp. rate 18, height 5\' 3"  (1.6 m), weight 101.3 kg, SpO2 100 %. Constitutional: No distress . Vital signs reviewed. HENT: Normocephalic.  Atraumatic. Eyes: EOMI. No discharge. Cardiovascular: No JVD.  RRR. Respiratory: Normal effort.  No stridor.  Bilateral clear to auscultation. GI: Non-distended.  BS +. Skin: Warm and dry.  Intact. Psych: Flat.  Slowed. Musc: No edema in extremities.  No tenderness in extremities. Neuro: Alert and oriented x2 Motor: Motor: RUE: 1+/5 proximal to distal with apraxia RLE: Hip flexion 1+/5, distally 0/5  Assessment/Plan: 1. Functional deficits which require 3+ hours per day of interdisciplinary therapy in a  comprehensive inpatient rehab setting.  Physiatrist is providing close team supervision and 24 hour management of active medical problems listed below.  Physiatrist and rehab team continue to assess barriers to discharge/monitor patient progress toward functional and medical goals  Care Tool:  Bathing    Body parts bathed by patient: Chest, Abdomen, Right upper leg, Left upper leg, Face, Right arm   Body parts bathed by helper: Left arm, Front perineal area, Buttocks, Right lower leg, Left lower leg     Bathing assist Assist Level: Moderate Assistance - Patient 50 - 74%     Upper Body Dressing/Undressing Upper body dressing   What is the patient wearing?: Pull over shirt    Upper body assist Assist Level: Moderate Assistance - Patient 50 - 74%    Lower Body Dressing/Undressing Lower body dressing      What is the patient wearing?: Incontinence brief, Pants     Lower body assist Assist for lower body dressing: Maximal Assistance - Patient 25 - 49%     Toileting Toileting Toileting Activity did not occur (Clothing management and hygiene only): N/A (no void or bm)  Toileting assist Assist for toileting: Maximal Assistance - Patient 25 - 49%     Transfers Chair/bed transfer  Transfers assist     Chair/bed transfer assist level: Maximal Assistance - Patient 25 - 49% (squat<>pivot)     Locomotion Ambulation   Ambulation assist   Ambulation activity did not occur: Safety/medical concerns  Assist level: 2 helpers Assistive device: Parallel bars Max distance: 8   Walk 10 feet activity   Assist  Walk 10 feet activity did not occur: Safety/medical concerns        Walk 50 feet activity   Assist Walk 50 feet with 2 turns  activity did not occur: Safety/medical concerns         Walk 150 feet activity   Assist Walk 150 feet activity did not occur: Safety/medical concerns         Walk 10 feet on uneven surface  activity   Assist Walk 10 feet  on uneven surfaces activity did not occur: Safety/medical concerns         Wheelchair     Assist Will patient use wheelchair at discharge?: Yes Type of Wheelchair: Manual    Wheelchair assist level: Maximal Assistance - Patient 25 - 49% Max wheelchair distance: 25    Wheelchair 50 feet with 2 turns activity    Assist        Assist Level: Total Assistance - Patient < 25%   Wheelchair 150 feet activity     Assist      Assist Level: Total Assistance - Patient < 25%   Blood pressure (!) 153/51, pulse 65, temperature 98 F (36.7 C), resp. rate 18, height 5\' 3"  (1.6 m), weight 101.3 kg, SpO2 100 %.  Medical Problem List and Plan: 1. 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            Continue CIR   WHO/PRAFO ordered  Team conference today to discuss current and goals and coordination of care, home and environmental barriers, and discharge planning with nursing, case manager, and therapies. Please see conference note from today as well.  2. Antithrombotics: -DVT/anticoagulation:Pharmaceutical:Heparin -antiplatelet therapy: N/a 3. Pain Management:Tylenol as needed. Denies pain.  4. Mood:LCSW to follow for evaluation and support when appropriate. -antipsychotic agents: N/A  Seroquel prn HS 25mg  per son.   Nursing order for lavender drops on forehead at night.  5. Neuropsych: This patientis notcapable of making decisions on herown behalf. 6. Skin/Wound Care:Routine pressure-relief measures. Maintain adequate nutrition and hydration status. 7. Fluids/Electrolytes/Nutrition:Monitor intake/output--  Will need assistance with feeding as due to fluctuating bouts of lethargy.  8. HTN: Monitor BP --continue Lasix, Hydralazine,  Metoprolol  HCTZ changed to chlorthalidone on 11/25             Vitals:   01/21/20 0252 01/21/20 0544  BP: (!) 155/71 (!) 153/51  Pulse: 65   Resp: 18   Temp: 98 F (36.7 C)   SpO2: 100%    Remains elevated on 11/24 9. Acute on chronic renal failure: BUN/SCr 34/1.5 at admission.  Decreased lasix to 10mg   Creatinine 1.49 on 11/22, plan to order labs for the end of this week 10. PNA?/Tracheobronchitis: Completed course of antibiotics on  11. Leukocytosis: Resolved   WBCs 9.8 on 11/22  Continue to monitor for signs of infection.  12. T2DM with hyperglycemia:  Monitor BS ac/hs.   CBG (last 3)  Recent Labs    01/20/20 2240 01/21/20 0619 01/21/20 0704  GLUCAP 85 56* 115*   Lantus decreased to 48U, decreased to 35 twice daily on 11/23, decreased to 28 on 11/24  Changed ISS to sensitive.   Bedtime snack ordered  Labile with hypoglycemia on 11/24 13. Post stroke dysphagia :             D2 thins, advance diet as tolerated. 14.  Hyperkalemia  Potassium 3.7 on 11/23  LOS: 5 days A FACE TO FACE EVALUATION WAS PERFORMED  Evelio Rueda 12/24 01/21/2020, 9:15 AM

## 2020-01-21 NOTE — Progress Notes (Signed)
Physical Therapy Session Note  Patient Details  Name: [name] [name] MRN: [id number] Date of Birth: [birth year]  Today's Date: 01/21/2020 PT Individual Time: 1430-1530 PT Individual Time Calculation (min): 60 min   Short Term Goals: Week 1:  PT Short Term Goal 1 (Week 1): Patient will roll B with no more than CGA and use of bed rails as needed PT Short Term Goal 2 (Week 1): Patient will transition supine > sit with no more than ModA x1 consistently PT Short Term Goal 3 (Week 1): Patient will transfer bed <> wc with no more than MaxA x1 at least 50% of the time with LRAD PT Short Term Goal 4 (Week 1): Patient will ambulate >65ft with +2 assist and LRAD  Skilled Therapeutic Interventions/Progress Updates:    Pt received seated in recliner, daughter at bedside, pt agreeable to PT session without reports of pain. Informed daughter of DC date and therapy goals that are set at [facility]. She reports children plan on rotating schedules for 12-18hr S/A and hope that she will be ambulating with RW at time of DC at a supervision level. Educated her on length of stroke recovery and that therapy continues after DC. Performed sit<>stand from recliner to [place] with maxA and transferred in perched position in [place] to w/c. She's able to stand with modA in perched position in [place]. W/c transport for energy conservation from her room to main therapy gym, wheeled inside // bars. Performed sit<>stand in // bars with maxA and performed pre-gait training including lateral weight shifts and static standing. She also performed forward/backward stepping with RLE with maxA for advancement and placement. Just as from prior sessions, pt becoming tearful and emotional with current mobility status and often compares herself to others. Required frequent words of encouragement and redirection, played therapeutic music to assist with distraction and participation. Performed squat<>pivot transfer with maxA from w/c to mat table.  Performed repeated sit<>stands 5x2 with mod/maxA from mat table and R knee block. Facilitated upward/erect posture due to forward flexed trunk. Practiced lateral scooting, L<>R along mat table, requiring maxA for hip facilitation and general sequencing. Performed squat<>pivot with maxA back to w/c from mat table and returned to room. Again, becoming emotional with mobility status once we returned. Required calming and performed [place] transfer with maxA from w/c to recliner where she was made comfortable with RUE pillow support, safety belt alarm on, needs in reach.  Therapy Documentation Precautions:  Precautions Precautions: Fall Precaution Comments: R-hemi; watch for subluxation Restrictions Weight Bearing Restrictions: No  Therapy/Group: Individual Therapy  Mung Rinker P Dawana Asper PT 01/21/2020, 7:36 AM

## 2020-01-21 NOTE — Progress Notes (Signed)
Patient ID: [name] [name], female   DOB: February 20, 1933, 86 y.o.   MRN: [id number] Met with the patient and her daughter to review role of the nurse CM and collaboration with the SW to faci tate preparation for discharge. Reviewed secondary stroke risk factors including HTN, HLD, HF and DM, with obesity. Reviewed handouts on nutrition for heart failure, dyslipidemia, carbohydrate counting and diabetes management with A1C of 8.6 upon admission. Discussed options for dietary modifications and rationale for recommendations. Continue to follow along to discharge and address educational needs. Margarito Liner

## 2020-01-22 DIAGNOSIS — N179 Acute kidney failure, unspecified: Secondary | ICD-10-CM | POA: Diagnosis not present

## 2020-01-22 DIAGNOSIS — N183 Chronic kidney disease, stage 3 unspecified: Secondary | ICD-10-CM

## 2020-01-22 DIAGNOSIS — Z794 Long term (current) use of insulin: Secondary | ICD-10-CM

## 2020-01-22 DIAGNOSIS — E1165 Type 2 diabetes mellitus with hyperglycemia: Secondary | ICD-10-CM | POA: Diagnosis not present

## 2020-01-22 DIAGNOSIS — I619 Nontraumatic intracerebral hemorrhage, unspecified: Secondary | ICD-10-CM | POA: Diagnosis not present

## 2020-01-22 DIAGNOSIS — F411 Generalized anxiety disorder: Secondary | ICD-10-CM | POA: Diagnosis not present

## 2020-01-22 LAB — GLUCOSE, CAPILLARY
Glucose-Capillary: 182 mg/dL — ABNORMAL HIGH (ref 70–99)
Glucose-Capillary: 189 mg/dL — ABNORMAL HIGH (ref 70–99)
Glucose-Capillary: 177 mg/dL — ABNORMAL HIGH (ref 70–99)
Glucose-Capillary: 227 mg/dL — ABNORMAL HIGH (ref 70–99)
Glucose-Capillary: 266 mg/dL — ABNORMAL HIGH (ref 70–99)

## 2020-01-22 NOTE — Progress Notes (Signed)
[place] PHYSICAL MEDICINE & REHABILITATION PROGRESS NOTE   Subjective/Complaints: Patient seen sitting up in her chair this morning.  Son at bedside.  She states she slept well overnight.  She notes significant improvement in heel pain.  She states she wants to be active today.  Review of systems: Denies CP, SOB, N/V/D  Objective:   No results found. Recent Labs    01/19/20 1305  WBC 9.8  HGB 13.1  HCT 41.0  PLT 321   Recent Labs    01/19/20 1305 01/20/20 0942  NA 136  --   K 5.4* 3.7  CL 97*  --   CO2 26  --   GLUCOSE 323*  --   BUN 49*  --   CREATININE 1.49*  --   CALCIUM 8.6*  --     Intake/Output Summary (Last 24 hours) at 01/22/2020 1158 Last data filed at 01/22/2020 0800 Gross per 24 hour  Intake 610 ml  Output --  Net 610 ml        Physical Exam: Vital Signs Blood pressure (!) 154/47, pulse 63, temperature 98.2 F (36.8 C), temperature source Oral, resp. rate 18, height 5\' 3"  (1.6 m), weight 101.3 kg, SpO2 98 %. Constitutional: No distress . Vital signs reviewed. HENT: Normocephalic.  Atraumatic. Eyes: EOMI. No discharge. Cardiovascular: No JVD.  RRR. Respiratory: Normal effort.  No stridor.  Bilateral clear to auscultation. GI: Non-distended.  BS +. Skin: Warm and dry.  Intact. Psych: Flat.  Slowed. Musc: No edema in extremities.  No tenderness in extremities. Neuro: Alert  Motor: Motor: RUE: 1+/5 proximal to distal with apraxia RLE: 2 -/5 proximal to distal with apraxia  Assessment/Plan: 1. Functional deficits which require 3+ hours per day of interdisciplinary therapy in a comprehensive inpatient rehab setting.  Physiatrist is providing close team supervision and 24 hour management of active medical problems listed below.  Physiatrist and rehab team continue to assess barriers to discharge/monitor patient progress toward functional and medical goals  Care Tool:  Bathing    Body parts bathed by patient: Chest, Abdomen, Right upper  leg, Left upper leg, Face, Right arm   Body parts bathed by helper: Right arm, Chest, Abdomen, Right upper leg, Left upper leg, Right lower leg, Left lower leg, Face Body parts n/a: Buttocks, Left arm, Front perineal area   Bathing assist Assist Level: Moderate Assistance - Patient 50 - 74%     Upper Body Dressing/Undressing Upper body dressing   What is the patient wearing?: Pull over shirt    Upper body assist Assist Level: Moderate Assistance - Patient 50 - 74%    Lower Body Dressing/Undressing Lower body dressing      What is the patient wearing?: Incontinence brief, Pants     Lower body assist Assist for lower body dressing: Moderate Assistance - Patient 50 - 74%     Toileting Toileting Toileting Activity did not occur and hygiene only): N/A (no void or bm)  Toileting assist Assist for toileting: Maximal Assistance - Patient 25 - 49%     Transfers Chair/bed transfer  Transfers assist     Chair/bed transfer assist level: Maximal Assistance - Patient 25 - 49% (squat<>pivot)     Locomotion Ambulation   Ambulation assist   Ambulation activity did not occur: Safety/medical concerns  Assist level: 2 helpers Assistive device: Parallel bars Max distance: 8   Walk 10 feet activity   Assist  Walk 10 feet activity did not occur: Safety/medical concerns  Walk 50 feet activity   Assist Walk 50 feet with 2 turns activity did not occur: Safety/medical concerns         Walk 150 feet activity   Assist Walk 150 feet activity did not occur: Safety/medical concerns         Walk 10 feet on uneven surface  activity   Assist Walk 10 feet on uneven surfaces activity did not occur: Safety/medical concerns         Wheelchair     Assist Will patient use wheelchair at discharge?: Yes Type of Wheelchair: Manual    Wheelchair assist level: Maximal Assistance - Patient 25 - 49% Max wheelchair distance: 25     Wheelchair 50 feet with 2 turns activity    Assist        Assist Level: Total Assistance - Patient < 25%   Wheelchair 150 feet activity     Assist      Assist Level: Total Assistance - Patient < 25%   Blood pressure (!) 154/47, pulse 63, temperature 98.2 F (36.8 C), temperature source Oral, resp. rate 18, height 5\' 3"  (1.6 m), weight 101.3 kg, SpO2 98 %.  Medical Problem List and Plan: 1. 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            Continue CIR   WHO/PRAFO nightly 2. Antithrombotics: -DVT/anticoagulation:Pharmaceutical:Heparin -antiplatelet therapy: N/a 3. Pain Management:Tylenol as needed. Denies pain.  4. Mood:LCSW to follow for evaluation and support when appropriate. -antipsychotic agents: N/A  Seroquel prn HS 25mg  per son.   Nursing order for lavender drops on forehead at night.  5. Neuropsych: This patientis notcapable of making decisions on herown behalf. 6. Skin/Wound Care:Routine pressure-relief measures. Maintain adequate nutrition and hydration status. 7. Fluids/Electrolytes/Nutrition:Monitor intake/output--  Will need assistance with feeding as due to fluctuating bouts of lethargy.  8. HTN: Monitor BP --continue Lasix, Hydralazine, Metoprolol  HCTZ changed to chlorthalidone on 11/25             Vitals:   01/22/20 0427 01/22/20 0557  BP: (!) 135/40 (!) 154/47  Pulse: 63   Resp: 18   Temp: 98.2 F (36.8 C)   SpO2: 98%    Borderline elevated on 11/25 9. Acute on chronic renal failure: BUN/SCr 34/1.5 at admission.  Decreased lasix to 10mg   Creatinine 1.49 on 11/22, labs ordered for tomorrow 10. PNA?/Tracheobronchitis: Completed course of antibiotics on  11. Leukocytosis: Resolved   WBCs 9.8  on 11/22  Continue to monitor for signs of infection.  12. T2DM with hyperglycemia:  Monitor BS ac/hs.   CBG (last 3)  Recent Labs    01/21/20 2023 01/22/20 0556 01/22/20 1146  GLUCAP 188* 182* 189*   Lantus decreased to 48U, decreased to 35 twice daily on 11/23, decreased to 28 on 11/24  Changed ISS to sensitive.   Bedtime snack ordered  Elevated on 11/25, will consider increasing medications again if necessary 13. Post stroke dysphagia :             D2 thins, advance diet as tolerated. 14.  Hyperkalemia  Potassium 3.7 on 11/23, labs ordered for tomorrow  LOS: 6 days A FACE TO FACE EVALUATION WAS PERFORMED  Akeya Ryther 12/24 01/22/2020, 11:58 AM

## 2020-01-23 ENCOUNTER — Inpatient Hospital Stay (HOSPITAL_COMMUNITY): Payer: Medicare Other

## 2020-01-23 ENCOUNTER — Inpatient Hospital Stay (HOSPITAL_COMMUNITY): Payer: Medicare Other | Admitting: Occupational Therapy

## 2020-01-23 ENCOUNTER — Encounter (HOSPITAL_COMMUNITY): Payer: Medicare Other | Admitting: Psychology

## 2020-01-23 ENCOUNTER — Inpatient Hospital Stay (HOSPITAL_COMMUNITY): Payer: Medicare Other | Admitting: Speech Pathology

## 2020-01-23 DIAGNOSIS — I619 Nontraumatic intracerebral hemorrhage, unspecified: Secondary | ICD-10-CM | POA: Diagnosis not present

## 2020-01-23 DIAGNOSIS — R7309 Other abnormal glucose: Secondary | ICD-10-CM | POA: Diagnosis not present

## 2020-01-23 DIAGNOSIS — F411 Generalized anxiety disorder: Secondary | ICD-10-CM | POA: Diagnosis not present

## 2020-01-23 DIAGNOSIS — N179 Acute kidney failure, unspecified: Secondary | ICD-10-CM | POA: Diagnosis not present

## 2020-01-23 LAB — BASIC METABOLIC PANEL
Anion gap: 10 (ref 5–15)
BUN: 32 mg/dL — ABNORMAL HIGH (ref 8–23)
CO2: 27 mmol/L (ref 22–32)
Calcium: 8.8 mg/dL — ABNORMAL LOW (ref 8.9–10.3)
Chloride: 100 mmol/L (ref 98–111)
Creatinine, Ser: 1.5 mg/dL — ABNORMAL HIGH (ref 0.44–1.00)
GFR, Estimated: 34 mL/min — ABNORMAL LOW (ref >60)
Glucose, Bld: 163 mg/dL — ABNORMAL HIGH (ref 70–99)
Potassium: 3.8 mmol/L (ref 3.5–5.1)
Sodium: 137 mmol/L (ref 135–145)

## 2020-01-23 LAB — GLUCOSE, CAPILLARY
Glucose-Capillary: 145 mg/dL — ABNORMAL HIGH (ref 70–99)
Glucose-Capillary: 172 mg/dL — ABNORMAL HIGH (ref 70–99)
Glucose-Capillary: 125 mg/dL — ABNORMAL HIGH (ref 70–99)
Glucose-Capillary: 158 mg/dL — ABNORMAL HIGH (ref 70–99)

## 2020-01-23 NOTE — Progress Notes (Signed)
Speech Language Pathology Weekly Progress and Session Note  Patient Details  Name: [name] [name] MRN: [id number] Date of Birth: 1932/12/18  Beginning of progress report period: January 16, 2020 End of progress report period:  January 23, 2020  Today's Date: 01/23/2020 SLP Individual Time: 1300-1400 SLP Individual Time Calculation (min): 60 min  Short Term Goals: Week 1: SLP Short Term Goal 1 (Week 1): Patient will consume trials of Dys 3 solids without overt coughing/throat clearing and with full clearance of oral cavity, with SLP only, with minA. SLP Short Term Goal 1 - Progress (Week 1): Progressing toward goal SLP Short Term Goal 2 (Week 1): Patient will demonstrate orientation to time/place/situation and recall recent daily events with minA cues for use of memory strategies as needed. SLP Short Term Goal 2 - Progress (Week 1): Partly met SLP Short Term Goal 3 (Week 1): Patient will be able to achieve 85% intelligibilty at conversational level with SLP during semi-structured conversations, with modA cues. SLP Short Term Goal 3 - Progress (Week 1): Met SLP Short Term Goal 4 (Week 1): Patient will perform basic level functional problem solving tasks with modA cues for accuracy. SLP Short Term Goal 4 - Progress (Week 1): Met SLP Short Term Goal 5 (Week 1): Patient will demonstrate awareness to both deficits and progress towards goals during structured discussion with SLP, with modA cues. SLP Short Term Goal 5 - Progress (Week 1): Met    New Short Term Goals: Week 2: SLP Short Term Goal 1 (Week 2): Patient will consume trials of Dys 3 solids without overt coughing/throat clearing and with full clearance of oral cavity, with SLP only, with minA. SLP Short Term Goal 2 (Week 2): Patient will demonstrate recall and awareness to specific areas of progress (cognitive, swallow, physical) with minA cues. SLP Short Term Goal 3 (Week 2): Patient will perform mild complex functional tasks (money  management, medication management, etc) with modA cues. SLP Short Term Goal 4 (Week 2): Patient will achieve 90% intelligibility of speech at conversational level with minA cues  Weekly Progress Updates:   Patient has made good progress meeting 3/5 STG's and making progress towards the two goals she has not met. She is demonstrating improved voice and speech intelligibility. She has improved but continues to fatigue fairly easily and is not quite ready to upgrade diet consistencies. She seems to be having more awareness to her deficits and will focus on negatives (things she can't do) and requires encouragement and cues to increase awareness to progress.    Intensity: Minumum of 1-2 x/day, 30 to 90 minutes Frequency: 3 to 5 out of 7 days Duration/Length of Stay: 12/9 Treatment/Interventions: Cognitive remediation/compensation;Functional tasks;Internal/external aids;Cueing hierarchy;Patient/family education;Medication managment;Multimodal communication approach;Environmental controls;Dysphagia/aspiration precaution training;Speech/Language facilitation   Daily Session  Skilled Therapeutic Interventions: Patient seen with son present in room for portion of session, for skilled ST session focusing on cognitive-linguistic goals. Patient appeared fatigued and per son, she had blood pressure medication which "makes her drowsy". Patient initially had difficulty engaging in tasks as she was saying she was tired but this improved as session progressed. Patient able to participate in novel word game but her reactions were somewhat off as every time she chose a card, she would say "Donnald Garre never played this game!" Patient demonstrated improved recall of recent milestones related to medical and therapy progress when using chart that her daughter had made for her and with SLP cues for attention. Patient continues to benefit from skilled SLP intervention to  maximize cognitive-linguistic, speech and swallow function  prior to discharge.    General    Pain Pain Assessment Pain Scale: 0-10 Pain Score: 0-No pain  Therapy/Group: Individual Therapy   Sonia Baller, MA, CCC-SLP Speech Therapy

## 2020-01-23 NOTE — Plan of Care (Signed)
  Problem: Consults Goal: RH STROKE PATIENT EDUCATION Description: See Patient Education module for education specifics  Outcome: Progressing   Problem: RH BOWEL ELIMINATION Goal: RH STG MANAGE BOWEL WITH ASSISTANCE Description: STG Manage Bowel with min Assistance. Outcome: Progressing   Problem: RH BLADDER ELIMINATION Goal: RH STG MANAGE BLADDER WITH ASSISTANCE Description: STG Manage Bladder With min Assistance Outcome: Progressing   Problem: RH SKIN INTEGRITY Goal: RH STG MAINTAIN SKIN INTEGRITY WITH ASSISTANCE Description: STG Maintain Skin Integrity With mod I Assistance. Outcome: Progressing   Problem: RH SAFETY Goal: RH STG ADHERE TO SAFETY PRECAUTIONS W/ASSISTANCE/DEVICE Description: STG Adhere to Safety Precautions With cues and reminders Outcome: Progressing   Problem: RH KNOWLEDGE DEFICIT Goal: RH STG INCREASE KNOWLEDGE OF DIABETES Outcome: Progressing Goal: RH STG INCREASE KNOWLEDGE OF HYPERTENSION Outcome: Progressing Goal: RH STG INCREASE KNOWLEDGE OF DYSPHAGIA/FLUID INTAKE Outcome: Progressing Goal: RH STG INCREASE KNOWLEGDE OF HYPERLIPIDEMIA Outcome: Progressing

## 2020-01-23 NOTE — Progress Notes (Signed)
Physical Therapy Session Note  Patient Details  Name: [name] [name] MRN: [id number] Date of Birth: 05/22/1932  Today's Date: 01/23/2020 PT Individual Time: 6962-9528 + 1445-1530 PT Individual Time Calculation (min): 45 min  + 45 min   Short Term Goals: Week 1:  PT Short Term Goal 1 (Week 1): Patient will roll B with no more than CGA and use of bed rails as needed PT Short Term Goal 2 (Week 1): Patient will transition supine > sit with no more than ModA x1 consistently PT Short Term Goal 3 (Week 1): Patient will transfer bed <> wc with no more than MaxA x1 at least 50% of the time with LRAD PT Short Term Goal 4 (Week 1): Patient will ambulate >69ft with +2 assist and LRAD  Skilled Therapeutic Interventions/Progress Updates:     1st session: Pt greeted seated in recliner, awake and agreeable to PT session. Son, Link Snuffer, at bedside. Pt without reports of pain. Performed Stedy transfer with modA for rising from recliner to w/c. W/c transport to main therapy gym where she performed squat<>pivot with maxA from w/c to mat table. Able to sit EOM with supervision, unsupported. Performed repeated sit<>stands with R knee block and therapist on stool with her arm on therapists shldr to promote forward trunk lean, requiring mod/maxA for rising and min/mod for balance. She then performed repeated sit<>stands with task overlay for reaching horseshoes on mirror, focusing on erect posture and functional reaching with LUE. Completed 2x20 therapy ball roll outs with large therapy ball and hand-over-hand assist on RUE, promoting pelvic mobility and forward trunk lean. Focused remainder of session on standing and static stepping with LLE with therapist blocking R knee. She required modA for balance and therapist facilitating lateral weight shift and cues for forward/backward stepping pattern to target. Performed squat<>pivot with maxA (improved hip clearance!) from mat table back to her w/c. Returned to her room where  she remained seated in w/c with needs in reach, son at bedside who was updated and pt's mobility and session.  2nd session: Pt received sitting in recliner, son at bedside, agreeable to PT session. Pt reports unrated low back and R knee pain at rest that evolves during weightbearing and standing. Rest breaks and emotional support provided for pain management. Performed squat<>pivot transfer with maxA from recliner to w/c, son assisting with stabilizing w/c. W/c transport for energy conservation to main therapy gym and placed in // bars. Performed sit<>stand with mod/maxA in // bars with R knee block. She ambulated the length of the // bars (~37ft) with mod/maxA and R knee block with chair follow for safety. Therapist assisting with R knee block during stance, lateral weight shifts, and erect posture. She demo's step-to gait pattern with R knee buckling, decreased L step length. She's able to initiate L swing but unable to control placement. She required extended seated rest breaks b/w efforts 2/2 fatigue and back/knee pain. She also became emotional during gait training and getting down on herself, requires words of encouragement and redirection during session. Returned to her room in w/c and performed squat<>pivot with maxA from w/c to EOB. Required mod/maxA for sit>supine with HOB flat and use of bedrail. TotalA +2 for supine scooting with HOB in trendelenburg. She ended session semi-reclined in bed with needs in reach, bed alarm on, son at bedside, pt made comfortable.  Therapy Documentation Precautions:  Precautions Precautions: Fall Precaution Comments: R-hemi; watch for subluxation Restrictions Weight Bearing Restrictions: No  Therapy/Group: Individual Therapy  Sherylann Vangorden P Rickell Wiehe  PT 01/23/2020, 7:52 AM

## 2020-01-23 NOTE — Progress Notes (Signed)
[facility] PHYSICAL MEDICINE & REHABILITATION PROGRESS NOTE   Subjective/Complaints: Patient seen sitting up in her chair this morning.  She states she slept even better overnight last time.  Son at bedside.  She is seen by neuropsychology this morning-discussed with neuropsych, improving.  Review of systems: Denies CP, SOB, N/V/D  Objective:   No results found. No results for input(s): WBC, HGB, HCT, PLT in the last 72 hours. Recent Labs    01/23/20 0454  NA 137  K 3.8  CL 100  CO2 27  GLUCOSE 163*  BUN 32*  CREATININE 1.50*  CALCIUM 8.8*    Intake/Output Summary (Last 24 hours) at 01/23/2020 1405 Last data filed at 01/23/2020 0900 Gross per 24 hour  Intake 120 ml  Output --  Net 120 ml        Physical Exam: Vital Signs Blood pressure (!) 141/49, pulse (!) 57, temperature 98.3 F (36.8 C), temperature source Oral, resp. rate 18, height 5\' 3"  (1.6 m), weight 101.3 kg, SpO2 97 %. Constitutional: No distress . Vital signs reviewed. HENT: Normocephalic.  Atraumatic. Eyes: EOMI. No discharge. Cardiovascular: No JVD.  RRR. Respiratory: Normal effort.  No stridor.  Bilateral clear to auscultation. GI: Non-distended.  BS +. Skin: Warm and dry.  Intact. Psych: Flat.  Slowed, improving Musc: No edema in extremities.  No tenderness in extremities. Neuro: Alert Motor: Motor: RUE: 1+/5 proximal to distal with apraxia, unchanged RLE: 2 -/5 proximal to distal with apraxia  Assessment/Plan: 1. Functional deficits which require 3+ hours per day of interdisciplinary therapy in a comprehensive inpatient rehab setting.  Physiatrist is providing close team supervision and 24 hour management of active medical problems listed below.  Physiatrist and rehab team continue to assess barriers to discharge/monitor patient progress toward functional and medical goals  Care Tool:  Bathing    Body parts bathed by patient: Chest, Abdomen, Right upper leg, Left upper leg, Face, Right  arm   Body parts bathed by helper: Right arm, Chest, Abdomen, Right upper leg, Left upper leg, Right lower leg, Left lower leg, Face Body parts n/a: Buttocks, Left arm, Front perineal area   Bathing assist Assist Level: Moderate Assistance - Patient 50 - 74%     Upper Body Dressing/Undressing Upper body dressing   What is the patient wearing?: Pull over shirt    Upper body assist Assist Level: Moderate Assistance - Patient 50 - 74%    Lower Body Dressing/Undressing Lower body dressing      What is the patient wearing?: Incontinence brief, Pants     Lower body assist Assist for lower body dressing: Moderate Assistance - Patient 50 - 74%     Toileting Toileting Toileting Activity did not occur and hygiene only): N/A (no void or bm)  Toileting assist Assist for toileting: Maximal Assistance - Patient 25 - 49%     Transfers Chair/bed transfer  Transfers assist     Chair/bed transfer assist level: Maximal Assistance - Patient 25 - 49% (squat<>pivot)     Locomotion Ambulation   Ambulation assist   Ambulation activity did not occur: Safety/medical concerns  Assist level: 2 helpers Assistive device: Parallel bars Max distance: 8   Walk 10 feet activity   Assist  Walk 10 feet activity did not occur: Safety/medical concerns        Walk 50 feet activity   Assist Walk 50 feet with 2 turns activity did not occur: Safety/medical concerns         Walk  150 feet activity   Assist Walk 150 feet activity did not occur: Safety/medical concerns         Walk 10 feet on uneven surface  activity   Assist Walk 10 feet on uneven surfaces activity did not occur: Safety/medical concerns         Wheelchair     Assist Will patient use wheelchair at discharge?: Yes Type of Wheelchair: Manual    Wheelchair assist level: Maximal Assistance - Patient 25 - 49% Max wheelchair distance: 25    Wheelchair 50 feet with 2 turns  activity    Assist        Assist Level: Total Assistance - Patient < 25%   Wheelchair 150 feet activity     Assist      Assist Level: Total Assistance - Patient < 25%   Blood pressure (!) 141/49, pulse (!) 57, temperature 98.3 F (36.8 C), temperature source Oral, resp. rate 18, height 5\' 3"  (1.6 m), weight 101.3 kg, SpO2 97 %.  Medical Problem List and Plan: 1. 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            Continue CIR   WHO/PRAFO nightly 2. Antithrombotics: -DVT/anticoagulation:Pharmaceutical:Heparin -antiplatelet therapy: N/a 3. Pain Management:Tylenol as needed. Denies pain.  4. Mood:LCSW to follow for evaluation and support when appropriate. -antipsychotic agents: N/A  Seroquel prn HS 25mg  per son.   Nursing order for lavender drops on forehead at night.   Improving 5. Neuropsych: This patientis notcapable of making decisions on herown behalf. 6. Skin/Wound Care:Routine pressure-relief measures. Maintain adequate nutrition and hydration status. 7. Fluids/Electrolytes/Nutrition:Monitor intake/output--  Will need assistance with feeding as due to fluctuating bouts of lethargy.  8. HTN: Monitor BP --continue Lasix, Hydralazine, Metoprolol  HCTZ changed to chlorthalidone on 11/25             Vitals:   01/23/20 0418 01/23/20 0609  BP: (!) 140/46 (!) 141/49  Pulse: (!) 57   Resp: 18   Temp: 98.3 F (36.8 C)   SpO2: 99% 97%   Mildly elevated, but improved 9. Acute on chronic renal failure: BUN/SCr 34/1.5 at admission.  Decreased lasix to 10mg   Creatinine 1.50 on 11/26 10. PNA?/Tracheobronchitis: Completed course of antibiotics on  11. Leukocytosis: Resolved   WBCs 9.8 on 11/22  Continue to monitor for signs  of infection.  12. T2DM with hyperglycemia:  Monitor BS ac/hs.   CBG (last 3)  Recent Labs    01/22/20 2119 01/23/20 0605 01/23/20 1158  GLUCAP 227* 145* 172*   Lantus decreased to 48U, decreased to 35 twice daily on 11/23, decreased to 28 on 11/24  Changed ISS to sensitive.   Bedtime snack ordered  Elevated on 11/26, will consider medication adjustments tomorrow persistent 13. Post stroke dysphagia :             D2 thins, advance diet as tolerated. 14.  Hyperkalemia  Potassium 3.8 on 11/26  LOS: 7 days A FACE TO FACE EVALUATION WAS PERFORMED  Emmabelle Fear 12/24 01/23/2020, 2:05 PM

## 2020-01-23 NOTE — Consult Note (Signed)
Neuropsychological Consultation   Patient:   [name] [name]   DOB:   07/26/1932  MR Number:  [id number]  Location:  [facility] [address] CENTER [id number] [place] [address] Dept: [phone] Loc: [phone]           Date of Service:   01/23/2020  Start Time:   8 AM End Time:   9 AM  Provider/Observer:  Arley Phenix, Psy.D.       Clinical Neuropsychologist       Billing Code/Service: [phone]  Chief Complaint:    [name] [name] is an 86 year old female with a history of type 2 diabetes, chronic kidney disease, hypertension, congestive heart failure, morbid obesity.  Patient was admitted on 01/04/2020 after collapsing at church during church service.  Patient was initially found to have right hemiparesis with dysarthria.  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  Patient had acute hypoxic/hypercapnic respiratory failure felt due to hemorrhagic stroke.  Patient was able to tolerate extubation on 01/06/2020.  Patient had follow-up MRI showing no visible mass or vascular lesion, extensive small vessel ischemia and acute left thalamic bleed without significant change.  On 01/13/2020 patient was noted to have change in mental status with significant somnolence and inability to follow any commands.  Repeat CT showed subacute left thalamic hematoma without acute or interval findings.  There was a question whether these changes were due to seizure.  Patient had a negative for seizure EEG.  Mental status was slowly improving with increased verbal output and patient referred for CIR due to limitations from right hemiparesis with inattention, cognitive deficits, decreased initiation and activity and delayed processing speed as well as expressive deficits.  Reason for Service:  Patient was referred for  neuropsychological consultation due to coping and adjustment issues, history of anxiety issues and ongoing residual cognitive deficits.  Below see HPI for the current admission.  HPI:  [name] [name] is an 86 year old female with history of T2DM, CKD (Dr. Eliott Nine) HTN, CHF,morbid obesity--BMI 41;who was admitted on 01/04/2020 after collapsing at church during her service. History taken from chart review due to aphasia. On admission she was found to have right hemiparesis with dysarthria.  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 She was started on Cleviprex and labetalol for BP control and required intubation for airway protection due to progressive decline in LOC. Acute hypoxic/hypercapnic respiratory failure felt to be due tohemorrhagic stroke was able to tolerate extubation on 01/06/2020.Follow-up MRI/MRI brain showed no visible mass or vascular lesion, extensive small vessel ischemia and acute left thalamic bleed without significant change. Carotid Dopplers were negative for significant ICA stenosi.Echocardiogram with EF of 55-60% with no wal abnormality and moderate mitral annular calcification. Tube feeds initiated for nutritional support.IV abx added on 01/08/2020 due to ongoing lethargy as well as low-grade fevers with leukocytosiswith WBC-15.3. Blood cultures x2 from11/10 were negative for growth.  On 01/13/2020,she was noted to have change in mental status with significant somnolence and inability to follow any commands.Repeat CT head showed subacute left thalamic hematoma without acute or interval findings. Dr. Jerrell Belfast question seizures as cause for mental status change and EEG ordered which was negative for seizures or epileptiform discharges. Her mentation  is slowly improving with increase in verbal output and diet has been advanced to dysphagia #2, thin liquids. She has  had steady worsening of renal status with recurrent leucocytosis. Mentation continues to fluctuate. Cortak removed today. Therapy cotreatment ongoing with patient showing ability to activate RLE and stand in steady. She continues to have limitations due to right hemiparesis with inattention, cognitive deficits decreased initiation and delayed processing as well as expressive deficits affecting ability to carry out ADLs and mobility. CIR was recommended due to functional decline. Please see preadmission assessment from earlier today as well.   Current Status:  Upon entering the room, the patient was sitting in the reclining chair in the upright position.  She had her breakfast available to her and had been eating some and her son was present in the room.  The patient was alert and oriented aware that she was in the hospital and remembering specific aspects of the rehab activity she had been doing.  The patient was aware of her hemorrhagic stroke but not a lot in greater detail.  The patient for the most part exhibited very stable mood state but did acknowledge some worry and repetitive thinking of and concern for her need for help from family members going forward.  Patient was very pleased about the rehab progress that she was making a concern about the limitations for her right dominant hand.  The patient was able to move her right leg but no movement in right toes.  She had little to no movement in her right arm.  Patient's receptive and expressive language was intact.  Patient was able to describe therapeutic interventions that have been going on with her and recall various efforts and goals that have been set and were being worked on.  Behavioral Observation: [name] [name]  presents as a 86 y.o.-year-old Right Caucasian Female who appeared her stated age. her dress was Appropriate and she was Well Groomed and her manners were Appropriate to the situation.  her participation was indicative of Appropriate  and Redirectable behaviors.  There were any physical disabilities noted.  she displayed an appropriate level of cooperation and motivation.     Interactions:    Active Appropriate and Redirectable  Attention:   abnormal and attention span appeared shorter than expected for age  Memory:   within normal limits; recent and remote memory intact  Visuo-spatial:  not examined  Speech (Volume):  normal  Speech:   normal; normal  Thought Process:  Coherent and Relevant  Though Content:  WNL; not suicidal and not homicidal  Orientation:   person, place, time/date and situation  Judgment:   Fair  Planning:   Fair  Affect:    Anxious  Mood:    Anxious  Insight:   Fair  Intelligence:   normal  Medical History:   Past Medical History:  Diagnosis Date  . Colon polyp   . Diabetes mellitus 1992  . Hypertension 1996  . Pancreatitis    Psychiatric History:  No prior psychiatric history the patient had been living independently prior to her recent hemorrhagic stroke.  Family Med/Psych History: History reviewed. No pertinent family history.  Impression/DX:  [name] [name] is an 86 year old female with a history of type 2 diabetes, chronic kidney disease, hypertension, congestive heart failure, morbid obesity.  Patient was admitted on 01/04/2020 after collapsing at church during church service.  Patient was initially found to have right hemiparesis with dysarthria.  Head CT showed 1.3 x 2.7 x 1.5 cm  acute left thalamic and basal ganglia hemorrhage with partial effacement of third ventricle and 2 mm midline shift.  Patient had acute hypoxic/hypercapnic respiratory failure felt due to hemorrhagic stroke.  Patient was able to tolerate extubation on 01/06/2020.  Patient had follow-up MRI showing no visible mass or vascular lesion, extensive small vessel ischemia and acute left thalamic bleed without significant change.  On 01/13/2020 patient was noted to have change in mental status with  significant somnolence and inability to follow any commands.  Repeat CT showed subacute left thalamic hematoma without acute or interval findings.  There was a question whether these changes were due to seizure.  Patient had a negative for seizure EEG.  Mental status was slowly improving with increased verbal output and patient referred for CIR due to limitations from right hemiparesis with inattention, cognitive deficits, decreased initiation and activity and delayed processing speed as well as expressive deficits.  Upon entering the room, the patient was sitting in the reclining chair in the upright position.  She had her breakfast available to her and had been eating some and her son was present in the room.  The patient was alert and oriented aware that she was in the hospital and remembering specific aspects of the rehab activity she had been doing.  The patient was aware of her hemorrhagic stroke but not a lot in greater detail.  The patient for the most part exhibited very stable mood state but did acknowledge some worry and repetitive thinking of and concern for her need for help from family members going forward.  Patient was very pleased about the rehab progress that she was making a concern about the limitations for her right dominant hand.  The patient was able to move her right leg but no movement in right toes.  She had little to no movement in her right arm.  Patient's receptive and expressive language was intact.  Patient was able to describe therapeutic interventions that have been going on with her and recall various efforts and goals that have been set and were being worked on.  Disposition/Plan:  Today we worked on coping and adjustment issues particular around the patient is concerned about the degree of independence she will be able to achieve.  She is aware of significant improvements that she has been making over the past week and is very motivated in the therapeutic process.  We worked  on coping and adjustment issues and help to frame her recovery and understand timelines of change.  I will follow up with the patient first of next week.  Diagnosis:    Anxiety State        Electronically Signed   _______________________ Arley Phenix, Psy.D.

## 2020-01-23 NOTE — Progress Notes (Signed)
Occupational Therapy Session Note  Patient Details  Name: [name] [name] MRN: [id number] Date of Birth: [birth year]  Today's Date: 01/23/2020 OT Individual Time: 0762-2633 OT Individual Time Calculation (min): 60 min    Short Term Goals: Week 1:  OT Short Term Goal 1 (Week 1): Pt will complete toilet transfer with max assist. OT Short Term Goal 2 (Week 1): Pt will engage in self-care task in standing for 30 seconds with mod assist. OT Short Term Goal 3 (Week 1): Pt will don shirt with mod assist OT Short Term Goal 4 (Week 1): Pt will demonstrate dynamic sitting balance for 1 minute with mod asisst.  Skilled Therapeutic Interventions/Progress Updates:    Pt sitting up in recliner, reports she feels clean and has clean clothes on, does not wish to complete self care at this time.  Pt also c/o feeling "groggy" after taking morning medications, no c/o pain throughout session.  Pt needing max encouragement and cueing to stay awake and facilitate participation throughout OT session.  Neuro re-ed completed RUE using visual feedback from mirror, TCs to facilitate muscle activation and recruitment, and VCs to promote motor planning of each movement.  Pt completed 2 x 10 reps of each: Shoulder shrug, scapular retraction, horizontal abd/adduction, ER/IR, elbow flex/ext, FA pro/sup, wrist ext/flex (in gravity eliminated with manual support provided to arm), gross grasp/digital extension.  Pt noted with following MMT: Shoulder shrug 1/5, scapular retraction 1/5, horizontal abd/adduction 2-/5, ER/IR 2-/5, elbow flex/ext 2-/5, FA pro 3/5 sup 2/5, wrist ext/flex 1/5, gross grip 3/5, digital extension 1/5. Call bell in reach, seat belt alarm on.    Therapy Documentation Precautions:  Precautions Precautions: Fall Precaution Comments: R-hemi; watch for subluxation Restrictions Weight Bearing Restrictions: No   Therapy/Group: Individual Therapy  Amie Critchley 01/23/2020, 4:16 PM

## 2020-01-24 ENCOUNTER — Inpatient Hospital Stay (HOSPITAL_COMMUNITY): Payer: Medicare Other | Admitting: Occupational Therapy

## 2020-01-24 ENCOUNTER — Inpatient Hospital Stay (HOSPITAL_COMMUNITY): Payer: Medicare Other

## 2020-01-24 DIAGNOSIS — F411 Generalized anxiety disorder: Secondary | ICD-10-CM | POA: Diagnosis not present

## 2020-01-24 DIAGNOSIS — I619 Nontraumatic intracerebral hemorrhage, unspecified: Secondary | ICD-10-CM | POA: Diagnosis not present

## 2020-01-24 DIAGNOSIS — R0989 Other specified symptoms and signs involving the circulatory and respiratory systems: Secondary | ICD-10-CM

## 2020-01-24 DIAGNOSIS — E1165 Type 2 diabetes mellitus with hyperglycemia: Secondary | ICD-10-CM | POA: Diagnosis not present

## 2020-01-24 DIAGNOSIS — I69391 Dysphagia following cerebral infarction: Secondary | ICD-10-CM | POA: Diagnosis not present

## 2020-01-24 LAB — GLUCOSE, CAPILLARY
Glucose-Capillary: 135 mg/dL — ABNORMAL HIGH (ref 70–99)
Glucose-Capillary: 167 mg/dL — ABNORMAL HIGH (ref 70–99)
Glucose-Capillary: 132 mg/dL — ABNORMAL HIGH (ref 70–99)
Glucose-Capillary: 154 mg/dL — ABNORMAL HIGH (ref 70–99)

## 2020-01-24 NOTE — Progress Notes (Signed)
Physical Therapy Weekly Progress Note  Patient Details  Name: [name] [name] MRN: [id number] Date of Birth: 1933/01/01  Beginning of progress report period: January 17, 2020 End of progress report period: January 24, 2020  Today's Date: 01/24/2020 PT Individual Time: 1100-1200 PT Individual Time Calculation (min): 60 min   Patient has met 2 of 4 short term goals. Pt is making slow but appropriate progress during her rehabilitation. She now requires mod/maxA for bed mobility, static sitting balance with supervision, sit<>stand transfers with mod/maxA and R knee block, squat<>pivot transfers with maxA, and has ambulated in // bars with maxA with chair follow. She continues to be primarily limited by R hemibody weakness, decreased activity tolerance with global deconditioning, impaired standing balance, premorbid knee/back/hip pains, and decreased ability to compensate for deficits.  Patient continues to demonstrate the following deficits muscle weakness, decreased cardiorespiratoy endurance, unbalanced muscle activation, decreased coordination and decreased motor planning and decreased sitting balance, decreased standing balance, decreased postural control, hemiplegia and decreased balance strategies and therefore will continue to benefit from skilled PT intervention to increase functional independence with mobility.  Patient progressing toward long term goals.  Continue plan of care.  PT Short Term Goals Week 1:  PT Short Term Goal 1 (Week 1): Patient will roll B with no more than CGA and use of bed rails as needed PT Short Term Goal 2 (Week 1): Patient will transition supine > sit with no more than ModA x1 consistently PT Short Term Goal 3 (Week 1): Patient will transfer bed <> wc with no more than MaxA x1 at least 50% of the time with LRAD PT Short Term Goal 4 (Week 1): Patient will ambulate >29ft with +2 assist and LRAD Week 2:  PT Short Term Goal 1 (Week 2): Pt will complete bed  mobility with no more than modA PT Short Term Goal 2 (Week 2): Pt will perform bed<>chair transfers with modA and LRAD PT Short Term Goal 3 (Week 2): Pt will ambulate 33ft with modA +2 with LRAD  Skilled Therapeutic Interventions/Progress Updates:    Pt received sitting in recliner, son at bedside, pt agreeable to PT session without reports of pain. Later during mobility training, she would complain (unrated) of L hip, R knee, and lower back pain. Emotional support and rest breaks provided for pain management. Performed squat<>pivot transfer with maxA +2 (son assisted) from recliner to w/c. W/c transport for time management from her room to main therapy gym. Performed squat<>pivot with maxA/totalA from w/c to mat table. Able to sit EOM with supervision while unsupported. Performed repeated sit<>stands, x5 bouts, with maxA and R knee block from lowered mat table height. Cues for forward weight shift, sequencing, and hip/trunk extension. Pt requiring frequent and extended rest breaks during session due to fatigue > pain (as listed above). Performed standing reaching with modA for balance and R knee block, using LUE to reach for hanging horseshoes on mirror, emphasizing thoracic spine elongation, forward weight shift, and RLE weight bearing. She then completed pre-gait training, performing forward/backward stepping with LLE with therapist providing R knee block, requiring modA for standing balance and therapist facilitating lateral weight shifts and pelvic rotations for stepping. She demo's decreased L step length and expresses fearfulness of falling quite often despite encouragement. Performed squat<>pivot with maxA back to her w/c and she was returned to her room where she remained seated in w/c with needs in reach, 1/2 lap tray supporting RUE, safety belt alarm on.  Therapy Documentation Precautions:  Precautions  Precautions: Fall Precaution Comments: R-hemi; watch for subluxation Restrictions Weight  Bearing Restrictions: No  Therapy/Group: Individual Therapy  Sihaam Chrobak P Furkan Keenum PT 01/24/2020, 7:46 AM

## 2020-01-24 NOTE — Progress Notes (Signed)
Speech Language Pathology Daily Session Note  Patient Details  Name: [name] [name] MRN: [id number] Date of Birth: 1932-11-16  Today's Date: 01/24/2020 SLP Individual Time: 8675-4492 SLP Individual Time Calculation (min): 60 min  Short Term Goals: Week 2: SLP Short Term Goal 1 (Week 2): Patient will consume trials of Dys 3 solids without overt coughing/throat clearing and with full clearance of oral cavity, with SLP only, with minA. SLP Short Term Goal 2 (Week 2): Patient will demonstrate recall and awareness to specific areas of progress (cognitive, swallow, physical) with minA cues. SLP Short Term Goal 3 (Week 2): Patient will perform mild complex functional tasks (money management, medication management, etc) with modA cues. SLP Short Term Goal 4 (Week 2): Patient will achieve 90% intelligibility of speech at conversational level with minA cues  Skilled Therapeutic Interventions: Pt seen for ongoing tx of dysphagia management and cognitive linguistic goals, son present throughout session. Pt sitting upright in chair and reports she had a good night and morning. Pt agreeable to Dys 3 trials, intake limited d/t patient feeling full from breakfast. Pt tolerating Dys 3 solids with mild extended mastication, swallow initiation appears timely throughout. Min diffuse oral stasis with all trials, effectively cleared with cued liquid wash. Pt with one episode small throat clear, reporting she feels like solid was "stuck" in throat. Pt benefits from verbal cues to recall and utilize compensatory swallow strategies, mostly alternating solids and liquids and single bites. Pt participating in mildly complex category task, initially requiring min cues moving to max cues toward end d/t fatigue. Pt reporting significant cognitive fatigue toward end of tx session, benefits from frequent rest breaks and repetition of prompts to increase participation and accuracy of tasks. Pt left in chair with son present and  all needs met. NT made aware ST session concluded to assist patient to bathroom. Cont ST POC.    Pain Pain Assessment Pain Scale: 0-10 Pain Score: 0-No pain  Therapy/Group: Individual Therapy  Dewaine Conger 01/24/2020, 10:59 AM

## 2020-01-24 NOTE — Progress Notes (Signed)
Occupational Therapy Session Note  Patient Details  Name: [name] [name] MRN: [id number] Date of Birth: 12-01-1932  Today's Date: 01/24/2020 OT Individual Time: 1403-1505 OT Individual Time Calculation (min): 62 min    Short Term Goals: Week 1:  OT Short Term Goal 1 (Week 1): Pt will complete toilet transfer with max assist. OT Short Term Goal 2 (Week 1): Pt will engage in self-care task in standing for 30 seconds with mod assist. OT Short Term Goal 3 (Week 1): Pt will don shirt with mod assist OT Short Term Goal 4 (Week 1): Pt will demonstrate dynamic sitting balance for 1 minute with mod asisst.  Skilled Therapeutic Interventions/Progress Updates:    Pt in recliner to start session.  She was reporting fatigue, but agreeable to participate in therapy.  She completed stand pivot transfer to the wheelchair with total assist stand pivot.  She was then taken down to the therapy gym where she completed squat pivot transfer to the therapy mat with mod assist.  Worked on stretching the anterior shoulder and pectoral prior to weightbearing activity.  Next, therapist placed the RUE in weightbearing and had pt work on picking up and placing foam blocks from the EOM to the bedside table with the LUE, emphasizing sit to squat while reaching forward.  Max assist was needed initially progressing to min assist from slightly elevated surface to complete sit to squat.  While completing task, pt reported increased tingling in the left index finger and thumb, which remained throughout session, however pt only used the hand for picking up and placing the foam blocks.  BP taken in sitting at 147/48 with O2 sats at 97% and HR at 56 BPM.  Pt with reports of needing to use the bathroom so squat pivot transfer completed back to the wheelchair at mod assist level.  Once in the room the [facility] was utilized for transfer to the bathroom with mod assist for sit to stand from the wheelchair and light max assist for sit to stand  from the lower toilet.  Max assist was needed for clothing management sit to stand as well.  Finished session with transfer to the recliner per pt request with use of the Stedy from the toilet.  Call button and phone in reach with safety belt in place.     Therapy Documentation Precautions:  Precautions Precautions: Fall Precaution Comments: R-hemi; watch for subluxation Restrictions Weight Bearing Restrictions: No  Pain: Pain Assessment Pain Scale: Faces Faces Pain Scale: Hurts a little bit Pain Type: Acute pain Pain Location: Abdomen Pain Orientation: Left Pain Descriptors / Indicators: Discomfort Pain Onset: With Activity Pain Intervention(s): Repositioned;Emotional support ADL: See Care Tool Section for some details of mobility and selfcare  Therapy/Group: Individual Therapy  Kostas Marrow OTR/L 01/24/2020, 4:31 PM

## 2020-01-24 NOTE — Plan of Care (Signed)
  Problem: RH BOWEL ELIMINATION Goal: RH STG MANAGE BOWEL WITH ASSISTANCE Description: STG Manage Bowel with min Assistance. Outcome: Not Progressing; Miralax given; LBM 11/24

## 2020-01-24 NOTE — Progress Notes (Signed)
[facility] PHYSICAL MEDICINE & REHABILITATION PROGRESS NOTE   Subjective/Complaints: Patient seen sitting up this morning. She states she slept well overnight. He denies complaints.  Review of systems: Denies CP, SOB, N/V/D  Objective:   No results found. No results for input(s): WBC, HGB, HCT, PLT in the last 72 hours. Recent Labs    01/23/20 0454  NA 137  K 3.8  CL 100  CO2 27  GLUCOSE 163*  BUN 32*  CREATININE 1.50*  CALCIUM 8.8*    Intake/Output Summary (Last 24 hours) at 01/24/2020 0952 Last data filed at 01/23/2020 1600 Gross per 24 hour  Intake 420 ml  Output --  Net 420 ml        Physical Exam: Vital Signs Blood pressure (!) 127/48, pulse (!) 56, temperature 98.3 F (36.8 C), resp. rate 18, height 5\' 3"  (1.6 m), weight 101.3 kg, SpO2 97 %. Constitutional: No distress . Vital signs reviewed. HENT: Normocephalic.  Atraumatic. Eyes: EOMI. No discharge. Cardiovascular: No JVD.  RRR. Respiratory: Normal effort.  No stridor.  Bilateral clear to auscultation. GI: Non-distended.  BS +. Skin: Warm and dry.  Intact. Psych: Flat. Slowed, improving Musc: No edema in extremities.  No tenderness in extremities. Neuro: Alert Motor: Motor: RUE: 1+/5 proximal to distal with apraxia, stable RLE: 2 -/5 proximal to distal with apraxia  Assessment/Plan: 1. Functional deficits which require 3+ hours per day of interdisciplinary therapy in a comprehensive inpatient rehab setting.  Physiatrist is providing close team supervision and 24 hour management of active medical problems listed below.  Physiatrist and rehab team continue to assess barriers to discharge/monitor patient progress toward functional and medical goals  Care Tool:  Bathing    Body parts bathed by patient: Chest, Abdomen, Right upper leg, Left upper leg, Face, Right arm   Body parts bathed by helper: Right arm, Chest, Abdomen, Right upper leg, Left upper leg, Right lower leg, Left lower leg, Face Body  parts n/a: Buttocks, Left arm, Front perineal area   Bathing assist Assist Level: Moderate Assistance - Patient 50 - 74%     Upper Body Dressing/Undressing Upper body dressing   What is the patient wearing?: Pull over shirt    Upper body assist Assist Level: Moderate Assistance - Patient 50 - 74%    Lower Body Dressing/Undressing Lower body dressing      What is the patient wearing?: Incontinence brief, Pants     Lower body assist Assist for lower body dressing: Moderate Assistance - Patient 50 - 74%     Toileting Toileting Toileting Activity did not occur and hygiene only): N/A (no void or bm)  Toileting assist Assist for toileting: Maximal Assistance - Patient 25 - 49%     Transfers Chair/bed transfer  Transfers assist     Chair/bed transfer assist level: Maximal Assistance - Patient 25 - 49% (squat<>pivot)     Locomotion Ambulation   Ambulation assist   Ambulation activity did not occur: Safety/medical concerns  Assist level: 2 helpers Assistive device: Parallel bars Max distance: 8   Walk 10 feet activity   Assist  Walk 10 feet activity did not occur: Safety/medical concerns        Walk 50 feet activity   Assist Walk 50 feet with 2 turns activity did not occur: Safety/medical concerns         Walk 150 feet activity   Assist Walk 150 feet activity did not occur: Safety/medical concerns  Walk 10 feet on uneven surface  activity   Assist Walk 10 feet on uneven surfaces activity did not occur: Safety/medical concerns         Wheelchair     Assist Will patient use wheelchair at discharge?: Yes Type of Wheelchair: Manual    Wheelchair assist level: Maximal Assistance - Patient 25 - 49% Max wheelchair distance: 25    Wheelchair 50 feet with 2 turns activity    Assist        Assist Level: Total Assistance - Patient < 25%   Wheelchair 150 feet activity     Assist      Assist  Level: Total Assistance - Patient < 25%   Blood pressure (!) 127/48, pulse (!) 56, temperature 98.3 F (36.8 C), resp. rate 18, height 5\' 3"  (1.6 m), weight 101.3 kg, SpO2 97 %.  Medical Problem List and Plan: 1. 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            Continue CIR   WHO/PRAFO nightly 2. Antithrombotics: -DVT/anticoagulation:Pharmaceutical:Heparin -antiplatelet therapy: N/a 3. Pain Management:Tylenol as needed. Denies pain.  4. Mood:LCSW to follow for evaluation and support when appropriate. -antipsychotic agents: N/A  Seroquel prn HS 25mg  per son.   Nursing order for lavender drops on forehead at night.   Improving 5. Neuropsych: This patientis notcapable of making decisions on herown behalf. 6. Skin/Wound Care:Routine pressure-relief measures. Maintain adequate nutrition and hydration status. 7. Fluids/Electrolytes/Nutrition:Monitor intake/output--  Will need assistance with feeding as due to fluctuating bouts of lethargy.  8. HTN: Monitor BP --continue Lasix, Hydralazine, Metoprolol  HCTZ changed to chlorthalidone on 11/25             Vitals:   01/24/20 0306 01/24/20 0600  BP: (!) 119/95 (!) 127/48  Pulse: (!) 56   Resp: 18   Temp: 98.3 F (36.8 C)   SpO2: 97%    Labile on 11/27, monitor for trend 9. Acute on chronic renal failure: BUN/SCr 34/1.5 at admission.  Decreased lasix to 10mg   Creatinine 1.50 on 11/26 10. PNA?/Tracheobronchitis: Completed course of antibiotics on  11. Leukocytosis: Resolved   WBCs 9.8 on 11/22  Continue to monitor for signs of infection.  12. T2DM with hyperglycemia:  Monitor BS ac/hs.   CBG (last 3)  Recent Labs    01/23/20 1659 01/23/20 2056 01/24/20 0559  GLUCAP 125* 158* 135*    Lantus decreased to 48U, decreased to 35 twice daily on 11/23, decreased to 28 on 11/24  Changed ISS to sensitive.   Bedtime snack ordered  Elevated, but improving on 11/27 13. Post stroke dysphagia :             D2 thins, advance diet as tolerated. 14.  Hyperkalemia  Potassium 3.8 on 11/26  LOS: 8 days A FACE TO FACE EVALUATION WAS PERFORMED  Ayshia Gramlich 12/24 01/24/2020, 9:52 AM

## 2020-01-25 DIAGNOSIS — I619 Nontraumatic intracerebral hemorrhage, unspecified: Secondary | ICD-10-CM | POA: Diagnosis not present

## 2020-01-25 LAB — GLUCOSE, CAPILLARY
Glucose-Capillary: 98 mg/dL (ref 70–99)
Glucose-Capillary: 147 mg/dL — ABNORMAL HIGH (ref 70–99)
Glucose-Capillary: 150 mg/dL — ABNORMAL HIGH (ref 70–99)
Glucose-Capillary: 216 mg/dL — ABNORMAL HIGH (ref 70–99)

## 2020-01-25 MED ORDER — POLYETHYLENE GLYCOL 3350 17 G PO PACK
17.0000 g | PACK | Freq: Two times a day (BID) | ORAL | Status: DC
  Administered 2020-01-26 – 2020-02-12 (×24): 17 g via ORAL
  Filled 2020-01-25 (×35): qty 1

## 2020-01-25 NOTE — Progress Notes (Signed)
[facility] PHYSICAL MEDICINE & REHABILITATION PROGRESS NOTE   Subjective/Complaints: Patient seen laying in bed this morning.  She states she slept fairly well overnight, she is tearful because her bowel movements are not optimized.  Reassured patient.  Discussed skin abnormalities with nursing.  Review of systems: Denies CP, SOB, N/V/D  Objective:   No results found. No results for input(s): WBC, HGB, HCT, PLT in the last 72 hours. Recent Labs    01/23/20 0454  NA 137  K 3.8  CL 100  CO2 27  GLUCOSE 163*  BUN 32*  CREATININE 1.50*  CALCIUM 8.8*    Intake/Output Summary (Last 24 hours) at 01/25/2020 1748 Last data filed at 01/25/2020 1300 Gross per 24 hour  Intake 300 ml  Output --  Net 300 ml        Physical Exam: Vital Signs Blood pressure (!) 127/41, pulse (!) 50, temperature 98 F (36.7 C), temperature source Oral, resp. rate 20, height 5\' 3"  (1.6 m), weight 101.3 kg, SpO2 100 %. Constitutional: No distress . Vital signs reviewed. HENT: Normocephalic.  Atraumatic. Eyes: EOMI. No discharge. Cardiovascular: No JVD.  RRR. Respiratory: Normal effort.  No stridor.  Bilateral clear to auscultation. GI: Non-distended.  BS +. Skin: Warm and dry.  Intact. Psych: Tearful. Slowed. Musc: No edema in extremities.  No tenderness in extremities. Neuro: Alert Motor: Motor: RUE: 1+/5 proximal to distal with apraxia, stable RLE: 2 -/5 proximal to distal with apraxia, stable  Assessment/Plan: 1. Functional deficits which require 3+ hours per day of interdisciplinary therapy in a comprehensive inpatient rehab setting.  Physiatrist is providing close team supervision and 24 hour management of active medical problems listed below.  Physiatrist and rehab team continue to assess barriers to discharge/monitor patient progress toward functional and medical goals  Care Tool:  Bathing    Body parts bathed by patient: Chest, Abdomen, Right upper leg, Left upper leg, Face, Right  arm   Body parts bathed by helper: Right arm, Chest, Abdomen, Right upper leg, Left upper leg, Right lower leg, Left lower leg, Face Body parts n/a: Buttocks, Left arm, Front perineal area   Bathing assist Assist Level: Moderate Assistance - Patient 50 - 74%     Upper Body Dressing/Undressing Upper body dressing   What is the patient wearing?: Pull over shirt    Upper body assist Assist Level: Moderate Assistance - Patient 50 - 74%    Lower Body Dressing/Undressing Lower body dressing      What is the patient wearing?: Incontinence brief, Pants     Lower body assist Assist for lower body dressing: Moderate Assistance - Patient 50 - 74%     Toileting Toileting Toileting Activity did not occur and hygiene only): N/A (no void or bm)  Toileting assist Assist for toileting: Maximal Assistance - Patient 25 - 49%     Transfers Chair/bed transfer  Transfers assist     Chair/bed transfer assist level: Maximal Assistance - Patient 25 - 49%     Locomotion Ambulation   Ambulation assist   Ambulation activity did not occur: Safety/medical concerns  Assist level: 2 helpers Assistive device: Parallel bars Max distance: 8   Walk 10 feet activity   Assist  Walk 10 feet activity did not occur: Safety/medical concerns        Walk 50 feet activity   Assist Walk 50 feet with 2 turns activity did not occur: Safety/medical concerns         Walk 150 feet  activity   Assist Walk 150 feet activity did not occur: Safety/medical concerns         Walk 10 feet on uneven surface  activity   Assist Walk 10 feet on uneven surfaces activity did not occur: Safety/medical concerns         Wheelchair     Assist Will patient use wheelchair at discharge?: Yes Type of Wheelchair: Manual    Wheelchair assist level: Maximal Assistance - Patient 25 - 49% Max wheelchair distance: 25    Wheelchair 50 feet with 2 turns  activity    Assist        Assist Level: Total Assistance - Patient < 25%   Wheelchair 150 feet activity     Assist      Assist Level: Total Assistance - Patient < 25%   Blood pressure (!) 127/41, pulse (!) 50, temperature 98 F (36.7 C), temperature source Oral, resp. rate 20, height 5\' 3"  (1.6 m), weight 101.3 kg, SpO2 100 %.  Medical Problem List and Plan: 1. 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            Continue CIR   WHO/PRAFO nightly 2. Antithrombotics: -DVT/anticoagulation:Pharmaceutical:Heparin -antiplatelet therapy: N/a 3. Pain Management:Tylenol as needed. Denies pain.  4. Mood:LCSW to follow for evaluation and support when appropriate. -antipsychotic agents: N/A  Seroquel prn HS 25mg  per son.   Nursing order for lavender drops on forehead at night.   Overall improving 5. Neuropsych: This patientis notcapable of making decisions on herown behalf. 6. Skin/Wound Care:Routine pressure-relief measures. Maintain adequate nutrition and hydration status. 7. Fluids/Electrolytes/Nutrition:Monitor intake/output--  Will need assistance with feeding as due to fluctuating bouts of lethargy.  8. HTN: Monitor BP --continue Lasix, Hydralazine, Metoprolol  HCTZ changed to chlorthalidone on 11/25             Vitals:   01/25/20 0421 01/25/20 1424  BP: (!) 152/43 (!) 127/41  Pulse: (!) 55 (!) 50  Resp: 20 20  Temp: 98.6 F (37 C) 98 F (36.7 C)  SpO2: 99% 100%   Slightly labile, but?  Overall improving on 11/28 9. Acute on chronic renal failure: BUN/SCr 34/1.5 at admission.  Decreased lasix to 10mg   Creatinine 1.50 on 11/26 10. PNA?/Tracheobronchitis: Completed course of antibiotics on  11. Leukocytosis: Resolved    WBCs 9.8 on 11/22  Continue to monitor for signs of infection.  12. T2DM with hyperglycemia:  Monitor BS ac/hs.   CBG (last 3)  Recent Labs    01/25/20 0604 01/25/20 1144 01/25/20 1641  GLUCAP 98 147* 150*   Lantus decreased to 48U, decreased to 35 twice daily on 11/23, decreased to 28 on 11/24  Changed ISS to sensitive.   Slightly labile on 11/28, monitor for trend 13. Post stroke dysphagia :             D2 thins, advance diet as tolerated. 14.  Hyperkalemia  Potassium 3.8 on 11/26 15.  Slow transit constipation  Bowel meds increased on 11/28  LOS: 9 days A FACE TO FACE EVALUATION WAS PERFORMED  Avonell Lenig 12/28 01/25/2020, 5:48 PM

## 2020-01-25 NOTE — Progress Notes (Signed)
Pt slept well throughout the night. Dulcolax suppository given for constipation. Awaiting results.

## 2020-01-26 ENCOUNTER — Inpatient Hospital Stay (HOSPITAL_COMMUNITY): Payer: Medicare Other

## 2020-01-26 ENCOUNTER — Inpatient Hospital Stay (HOSPITAL_COMMUNITY): Payer: Medicare Other | Admitting: Occupational Therapy

## 2020-01-26 DIAGNOSIS — I619 Nontraumatic intracerebral hemorrhage, unspecified: Secondary | ICD-10-CM | POA: Diagnosis not present

## 2020-01-26 LAB — GLUCOSE, CAPILLARY
Glucose-Capillary: 128 mg/dL — ABNORMAL HIGH (ref 70–99)
Glucose-Capillary: 200 mg/dL — ABNORMAL HIGH (ref 70–99)
Glucose-Capillary: 178 mg/dL — ABNORMAL HIGH (ref 70–99)
Glucose-Capillary: 268 mg/dL — ABNORMAL HIGH (ref 70–99)

## 2020-01-26 LAB — BASIC METABOLIC PANEL
Anion gap: 14 (ref 5–15)
BUN: 32 mg/dL — ABNORMAL HIGH (ref 8–23)
CO2: 24 mmol/L (ref 22–32)
Calcium: 9 mg/dL (ref 8.9–10.3)
Chloride: 99 mmol/L (ref 98–111)
Creatinine, Ser: 1.73 mg/dL — ABNORMAL HIGH (ref 0.44–1.00)
GFR, Estimated: 29 mL/min — ABNORMAL LOW (ref >60)
Glucose, Bld: 131 mg/dL — ABNORMAL HIGH (ref 70–99)
Potassium: 3.7 mmol/L (ref 3.5–5.1)
Sodium: 137 mmol/L (ref 135–145)

## 2020-01-26 LAB — CBC
HCT: 38.4 % (ref 36.0–46.0)
Hemoglobin: 12.3 g/dL (ref 12.0–15.0)
MCH: 29.6 pg (ref 26.0–34.0)
MCHC: 32 g/dL (ref 30.0–36.0)
MCV: 92.3 fL (ref 80.0–100.0)
Platelets: 245 10*3/uL (ref 150–400)
RBC: 4.16 MIL/uL (ref 3.87–5.11)
RDW: 15.3 % (ref 11.5–15.5)
WBC: 5.6 10*3/uL (ref 4.0–10.5)
nRBC: 0 % (ref 0.0–0.2)

## 2020-01-26 NOTE — Progress Notes (Signed)
[facility] PHYSICAL MEDICINE & REHABILITATION PROGRESS NOTE   Subjective/Complaints:   Discussed sensory function and stroke as well as need to watch movement   Review of systems: Denies CP, SOB, N/V/D  Objective:   No results found. Recent Labs    01/26/20 0726  WBC 5.6  HGB 12.3  HCT 38.4  PLT 245   No results for input(s): NA, K, CL, CO2, GLUCOSE, BUN, CREATININE, CALCIUM in the last 72 hours.  Intake/Output Summary (Last 24 hours) at 01/26/2020 0930 Last data filed at 01/25/2020 1957 Gross per 24 hour  Intake 420 ml  Output -  Net 420 ml        Physical Exam: Vital Signs Blood pressure (!) 142/62, pulse 60, temperature 97.8 F (36.6 C), temperature source Oral, resp. rate 15, height 5\' 3"  (1.6 m), weight 101.3 kg, SpO2 94 %.  General: No acute distress Mood and affect are appropriate Heart: Regular rate and rhythm no rubs murmurs or extra sounds Lungs: Clear to auscultation, breathing unlabored, no rales or wheezes Abdomen: Positive bowel sounds, soft nontender to palpation, nondistended Extremities: No clubbing, cyanosis, or edema Skin: No evidence of breakdown, no evidence of rash Sensation absent to light touch   Musc: No edema in extremities.  No tenderness in extremities. Neuro: Alert Motor: Motor: RUE: 3- gripRLE: 2 -/5 proximal to distal with apraxia, stable  Assessment/Plan: 1. Functional deficits which require 3+ hours per day of interdisciplinary therapy in a comprehensive inpatient rehab setting.  Physiatrist is providing close team supervision and 24 hour management of active medical problems listed below.  Physiatrist and rehab team continue to assess barriers to discharge/monitor patient progress toward functional and medical goals  Care Tool:  Bathing    Body parts bathed by patient: Chest, Abdomen, Right upper leg, Left upper leg, Face, Right arm   Body parts bathed by helper: Right arm, Chest, Abdomen, Right upper leg, Left upper  leg, Right lower leg, Left lower leg, Face Body parts n/a: Buttocks, Left arm, Front perineal area   Bathing assist Assist Level: Moderate Assistance - Patient 50 - 74%     Upper Body Dressing/Undressing Upper body dressing   What is the patient wearing?: Pull over shirt    Upper body assist Assist Level: Moderate Assistance - Patient 50 - 74%    Lower Body Dressing/Undressing Lower body dressing      What is the patient wearing?: Incontinence brief, Pants     Lower body assist Assist for lower body dressing: Moderate Assistance - Patient 50 - 74%     Toileting Toileting Toileting Activity did not occur and hygiene only): N/A (no void or bm)  Toileting assist Assist for toileting: Maximal Assistance - Patient 25 - 49%     Transfers Chair/bed transfer  Transfers assist     Chair/bed transfer assist level: Maximal Assistance - Patient 25 - 49%     Locomotion Ambulation   Ambulation assist   Ambulation activity did not occur: Safety/medical concerns  Assist level: 2 helpers Assistive device: Parallel bars Max distance: 8   Walk 10 feet activity   Assist  Walk 10 feet activity did not occur: Safety/medical concerns        Walk 50 feet activity   Assist Walk 50 feet with 2 turns activity did not occur: Safety/medical concerns         Walk 150 feet activity   Assist Walk 150 feet activity did not occur: Safety/medical concerns  Walk 10 feet on uneven surface  activity   Assist Walk 10 feet on uneven surfaces activity did not occur: Safety/medical concerns         Wheelchair     Assist Will patient use wheelchair at discharge?: Yes Type of Wheelchair: Manual    Wheelchair assist level: Maximal Assistance - Patient 25 - 49% Max wheelchair distance: 25    Wheelchair 50 feet with 2 turns activity    Assist        Assist Level: Total Assistance - Patient < 25%   Wheelchair 150 feet  activity     Assist      Assist Level: Total Assistance - Patient < 25%   Blood pressure (!) 142/62, pulse 60, temperature 97.8 F (36.6 C), temperature source Oral, resp. rate 15, height 5\' 3"  (1.6 m), weight 101.3 kg, SpO2 94 %.  Medical Problem List and Plan: 1. 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            Continue CIR PT, OT, SLP, discussed need to watch movement in Right hand given sensory deficits  WHO/PRAFO nightly 2. Antithrombotics: -DVT/anticoagulation:Pharmaceutical:Heparin -antiplatelet therapy: N/a 3. Pain Management:Tylenol as needed. Denies pain.  4. Mood:LCSW to follow for evaluation and support when appropriate. -antipsychotic agents: N/A  Seroquel prn HS 25mg  per son.   Nursing order for lavender drops on forehead at night.   Overall improving 5. Neuropsych: This patientis notcapable of making decisions on herown behalf. 6. Skin/Wound Care:Routine pressure-relief measures. Maintain adequate nutrition and hydration status. 7. Fluids/Electrolytes/Nutrition:Monitor intake/output--  Will need assistance with feeding as due to fluctuating bouts of lethargy.  8. HTN: Monitor BP --continue Lasix, Hydralazine, Metoprolol  HCTZ changed to chlorthalidone on 11/25             Vitals:   01/26/20 0538 01/26/20 0824  BP: (!) 129/50 (!) 142/62  Pulse: (!) 55 60  Resp: 15   Temp: 97.8 F (36.6 C)   SpO2: 94%    Fair to good control 11/29 9. Acute on chronic renal failure: BUN/SCr 34/1.5 at admission.  Decreased lasix to 10mg   Creatinine 1.50 on 11/26 10. PNA?/Tracheobronchitis: Completed course of antibiotics on  11. Leukocytosis: Resolved WBC 5.6 K   12. T2DM with hyperglycemia:  Monitor BS ac/hs.   CBG (last 3)   Recent Labs    01/25/20 1641 01/25/20 2013 01/26/20 0635  GLUCAP 150* 216* 128*   Lantus decreased to 48U, decreased to 35 twice daily on 11/23, decreased to 28 on 11/24  Controlled 11/29 13. Post stroke dysphagia :             D2 thins, advance diet as tolerated. 14.  Hyperkalemia  Potassium 3.8 on 11/26 15.  Slow transit constipation  Bowel meds increased on 11/28  LOS: 10 days A FACE TO FACE EVALUATION WAS PERFORMED  12/29 01/26/2020, 9:30 AM

## 2020-01-26 NOTE — Progress Notes (Signed)
Speech Language Pathology Daily Session Note  Patient Details  Name: [name] [name] MRN: [id number] Date of Birth: 1932/11/25  Today's Date: 01/26/2020 SLP Individual Time: 1205-1300 SLP Individual Time Calculation (min): 55 min  Short Term Goals: Week 2: SLP Short Term Goal 1 (Week 2): Patient will consume trials of Dys 3 solids without overt coughing/throat clearing and with full clearance of oral cavity, with SLP only, with minA. SLP Short Term Goal 2 (Week 2): Patient will demonstrate recall and awareness to specific areas of progress (cognitive, swallow, physical) with minA cues. SLP Short Term Goal 3 (Week 2): Patient will perform mild complex functional tasks (money management, medication management, etc) with modA cues. SLP Short Term Goal 4 (Week 2): Patient will achieve 90% intelligibility of speech at conversational level with minA cues  Skilled Therapeutic Interventions:Skilled ST services focused on swallow and speech skills. SLP facilitated PO consumption of dys 3 textures and thin liquid lunch tray trial. SLP provided set up assist with tray and pt was able to locate items on right with supervision A verbal cues. Pt demonstrated mild anterior right spillage that was corrected once pt's daughter placed mirror directly infront of pt. Pt required extened time to masticate dys 3 textures, but mod I ability to verbalize and utilize small bites and clearing bolus in oral cavity prior to subsequent bites. Pt expressed " I will just go at my own pace" when referring to fatigue with slight increase of mastication time and appreciation of texture upgrade " you have been hiding all the good food."  Pt demonstrated x1 immediate cough when consuming chicken noodle soup broth via cup, however pt was able to consume smaller sips mod I and no further s/s aspiration occurred. SLP upgraded diet to dys 3 textures with continued full supervision, likely decreasing soon to intermittent supervision with  continued increase sustained attention noted. SLP educated pt in speech intelligibility strategies to increase volume and over articulate. Pt was able to recall in reference to SLP posting sign in room and utilized strategy in informal conversation with min A verbal cues. Pt reported being a "soft speaker" at baseline, therefore SLP emphasized use of over articulation strategy, pt agreed. Pt was left in room with call bell within reach and chair alarm set. SLP recommends to continue skilled services.     Pain Pain Assessment Pain Score: 0-No pain  Therapy/Group: Individual Therapy  Damareon Lanni  [facility] 01/26/2020, 3:46 PM

## 2020-01-26 NOTE — Progress Notes (Signed)
Physical Therapy Session Note  Patient Details  Name: [name] [name] MRN: [id number] Date of Birth: 1932-04-04  Today's Date: 01/26/2020 PT Individual Time: 1005-1105 PT Individual Time Calculation (min): 60 min   Short Term Goals: Week 1:  PT Short Term Goal 1 (Week 1): Patient will roll B with no more than CGA and use of bed rails as needed PT Short Term Goal 2 (Week 1): Patient will transition supine > sit with no more than ModA x1 consistently PT Short Term Goal 3 (Week 1): Patient will transfer bed <> wc with no more than MaxA x1 at least 50% of the time with LRAD PT Short Term Goal 4 (Week 1): Patient will ambulate >5ft with +2 assist and LRAD Week 2:  PT Short Term Goal 1 (Week 2): Pt will complete bed mobility with no more than modA PT Short Term Goal 2 (Week 2): Pt will perform bed<>chair transfers with modA and LRAD PT Short Term Goal 3 (Week 2): Pt will ambulate 38ft with modA +2 with LRAD  Skilled Therapeutic Interventions/Progress Updates:    PAIN reported dull ache in L hip w/activity, rest breaks given as needed, treatment to tolerance. Pt initially oob in recliner, daughter at bedside. Sit to stand from recliner w/mod assist, cues for set up, cues for ant wt shift and use of momentum w/transition. In standing worked on upright posture, wt shifting to midline w/mod assist and multimodal cues. stand pivot transfer to wc to R side w/mod assist, cues for sequencing, cues for posture, manual facilitation of wt shift and advancement of RLE.  Pt transported to gym for continued session.  In parallel bars, worked on standing balance, wt shifting, and gait as follows:  Gait 52ft w/max assist for wtshifting to L/advancement of RLE, heavy cues for advancement of R hip thru midstance/tends to collapse into flexion but able to improve w/cues for advancement/glut activation/trunk extension., wc follows.  Static standing w/bilat UE support on bars, pt w/stron post/L tendency.   Worked on wt shifting to R via focusing on wt shift at hips first w/LUE support and advancing to no UE support, able to maintain midline for up to 10 sec w/task  Standing/wt shift to midline then reaching to touch therapists L shoulder (seated in front of pt), head, r shoulder w/emphasis on maintaining midline.  Pt able to perform this w/min assist and verbal/tactile cues.  Pt required mult seated rest breaks between standing activites due to fatigue.  At end of session pt transported back to room.  Pt performed stand pivot transfer wc to recliner w/max assist of 1, cues for wtshifting and uses of momentum, cues for safety/pt attempting to sit prematurely due to fatigue.  Scoots in chair w/cues for sequencing and mod assist.  Pt reported to daughter activities/achievements in session. Pt left oob in recliner w/chair alarm set and needs in reach.    Therapy Documentation Precautions:  Precautions Precautions: Fall Precaution Comments: R-hemi; watch for subluxation Restrictions Weight Bearing Restrictions: No    Therapy/Group: Individual Therapy  [name], PT   Shearon Balo 01/26/2020, 12:26 PM

## 2020-01-26 NOTE — Progress Notes (Signed)
Occupational Therapy Weekly Progress Note  Patient Details  Name: [name] [name] MRN: [id number] Date of Birth: [birth year]  Beginning of progress report period: January 17, 2020 End of progress report period: January 26, 2020  Today's Date: 01/26/2020 OT Individual Time: 1400-1515 OT Individual Time Calculation (min): 75 min    Patient has met 4 of 4 short term goals.  Pt is steadily progressing with skilled OT evidenced by increased standing tolerance and improved dynamic sitting balance as well as improved independence with UB dressing, overall bathing, and progressing towards increased toileting independence.  Pt is showing carryover of trained skills including hemitechniques and use of adaptive equipment to compensate and increase ADL independence.  Pt does exhibit impaired motor planning and processing of RUE with mild right sided neglect and also exhibits impaired problem solving which act as barriers to progressing and require that pt receive repetitive cueing to facilitate further carryover of skills.   Patient continues to demonstrate the following deficits: muscle weakness, decreased cardiorespiratoy endurance, abnormal tone, motor apraxia, decreased coordination and decreased motor planning, decreased attention to right, decreased awareness, decreased problem solving, decreased safety awareness, decreased memory and delayed processing and decreased sitting balance, decreased standing balance, decreased postural control, hemiplegia and decreased balance strategies and therefore will continue to benefit from skilled OT intervention to enhance overall performance with BADL.  Patient progressing toward long term goals..  Continue plan of care.  OT Short Term Goals Week 1:  OT Short Term Goal 1 (Week 1): Pt will complete toilet transfer with max assist. OT Short Term Goal 1 - Progress (Week 1): Met OT Short Term Goal 2 (Week 1): Pt will engage in self-care task in standing for 30  seconds with mod assist. OT Short Term Goal 2 - Progress (Week 1): Met OT Short Term Goal 3 (Week 1): Pt will don shirt with mod assist OT Short Term Goal 3 - Progress (Week 1): Met OT Short Term Goal 4 (Week 1): Pt will demonstrate dynamic sitting balance for 1 minute with mod asisst. OT Short Term Goal 4 - Progress (Week 1): Met Week 2:  OT Short Term Goal 1 (Week 2): Pt will complete LB dressing using AE as needed with min assist OT Short Term Goal 2 (Week 2): Pt will complete sit to stand with min assist OT Short Term Goal 3 (Week 2): Pt will achive 45 degrees active right shoulder scaption to assist with self care. OT Short Term Goal 4 (Week 2): Pt will complete toileting with min assist.  Skilled Therapeutic Interventions/Progress Updates:    Pt sitting up in recliner, no c/o pain, requesting to take a shower and use the bathroom.  Pt completed sit<>stand at stedy with mod assist from recliner to toilet.  Pt able to pull pants down past hips standing at stedy with min assist.  Pt had continent episode of urine and completed pericare in sitting with distant supervision.  Pt completed sit<>stand at stedy with max assist initially then mod assist on second attempt and transferred to shower bench. Pt doffed shirt with mod I. Pt educated on use of long handled sponge and pt return demonstrated to bathe UB/LB with min assist only for buttocks.  Pt completed sit<>stand at stedy with mod assist and transferred to w/c at sink.  Pt donned shirt with setup.  Pt educated on use of reacher to donn brief (OT pre attached tabs) and pants over feet. Pt return demonstrated needing mod assist including sit<>stand at sink to  pull up over hips. Pt required total assist to donn grip socks.  Pt combed hair and applied face lotion with education on one handed technique to open lotion container.  Pt completed sit<>stand at stedy and transferred to recliner.  Pt required intermittent VCs and TCs throughout session to  attend to RUE and use AROM in shoulder, elbow, and hand functionally, however improved follow through with cueing noting initiation of shoulder scaption and elbow flexion (partial AROM).  Pt also required VCs throughout functional mobility to improve head and facilitate hip extension when standing due to forward and downward gaze.  Call bell in reach, seat belt alarm donned.    Therapy Documentation Precautions:  Precautions Precautions: Fall Precaution Comments: R-hemi; watch for subluxation Restrictions Weight Bearing Restrictions: No   Therapy/Group: Individual Therapy  Ezekiel Slocumb 01/26/2020, 4:01 PM

## 2020-01-27 ENCOUNTER — Inpatient Hospital Stay (HOSPITAL_COMMUNITY): Payer: Medicare Other

## 2020-01-27 ENCOUNTER — Inpatient Hospital Stay (HOSPITAL_COMMUNITY): Payer: Medicare Other | Admitting: Occupational Therapy

## 2020-01-27 DIAGNOSIS — I10 Essential (primary) hypertension: Secondary | ICD-10-CM | POA: Diagnosis not present

## 2020-01-27 DIAGNOSIS — E1165 Type 2 diabetes mellitus with hyperglycemia: Secondary | ICD-10-CM | POA: Diagnosis not present

## 2020-01-27 DIAGNOSIS — K5901 Slow transit constipation: Secondary | ICD-10-CM

## 2020-01-27 DIAGNOSIS — I619 Nontraumatic intracerebral hemorrhage, unspecified: Secondary | ICD-10-CM | POA: Diagnosis not present

## 2020-01-27 DIAGNOSIS — F411 Generalized anxiety disorder: Secondary | ICD-10-CM | POA: Diagnosis not present

## 2020-01-27 LAB — GLUCOSE, CAPILLARY
Glucose-Capillary: 202 mg/dL — ABNORMAL HIGH (ref 70–99)
Glucose-Capillary: 246 mg/dL — ABNORMAL HIGH (ref 70–99)
Glucose-Capillary: 192 mg/dL — ABNORMAL HIGH (ref 70–99)
Glucose-Capillary: 212 mg/dL — ABNORMAL HIGH (ref 70–99)

## 2020-01-27 MED ORDER — INSULIN GLARGINE 100 UNIT/ML ~~LOC~~ SOLN
30.0000 [IU] | Freq: Two times a day (BID) | SUBCUTANEOUS | Status: DC
  Administered 2020-01-27 – 2020-01-29 (×4): 30 [IU] via SUBCUTANEOUS
  Filled 2020-01-27 (×5): qty 0.3

## 2020-01-27 NOTE — Progress Notes (Signed)
Occupational Therapy Session Note  Patient Details  Name: [name] [name] MRN: [id number] Date of Birth: 11-22-1932  Today's Date: 01/27/2020 OT Individual Time: 1445-1530 OT Individual Time Calculation (min): 45 min    Short Term Goals: Week 2:  OT Short Term Goal 1 (Week 2): Pt will complete LB dressing using AE as needed with min assist OT Short Term Goal 2 (Week 2): Pt will complete sit to stand with min assist OT Short Term Goal 3 (Week 2): Pt will achive 45 degrees active right shoulder scaption to assist with self care. OT Short Term Goal 4 (Week 2): Pt will complete toileting with min assist.  Skilled Therapeutic Interventions/Progress Updates:    Pt sitting up in w/c, no pain, but c/o drowziness, concerned her medication is the cause.  Nurse aware.  Pt agreeable to working on RUE strength and control.  Neuro re-ed to RUE to facilitate increased isolated movements of extremity including shoulder FF, abduction (with elbow flexed), ER/IR, and horizontal abd/adduction, elbow flex/ext, forearm pro/sup, wrist ext/flex, and digital flex/extension with manual assist provided to position arm in gravity eliminated position and TCs/VCs/visualizations to promote motor processing and output.  Pt completed 2 x 10 reps of each movement.  Pt flexors stronger than extensors and noting some flexor hypertonicity in shoulder and elbow developing.  Pt dozing off periodically through session needing frequent VCs to stay alert.  Call bell in reach, seat belt alarm on.  Therapy Documentation Precautions:  Precautions Precautions: Fall Precaution Comments: R-hemi; watch for subluxation Restrictions Weight Bearing Restrictions: No   Therapy/Group: Individual Therapy  Amie Critchley 01/27/2020, 3:36 PM

## 2020-01-27 NOTE — Progress Notes (Signed)
Physical Therapy Session Note  Patient Details  Name: [name] [name] MRN: [id number] Date of Birth: [birth year]  Today's Date: 01/27/2020 PT Individual Time: 7829-5621 + 1345-1415 PT Individual Time Calculation (min): 60 min + 45 min  Short Term Goals: Week 2:  PT Short Term Goal 1 (Week 2): Pt will complete bed mobility with no more than modA PT Short Term Goal 2 (Week 2): Pt will perform bed<>chair transfers with modA and LRAD PT Short Term Goal 3 (Week 2): Pt will ambulate 68ft with modA +2 with LRAD  Skilled Therapeutic Interventions/Progress Updates:   1st session:   Pt received seated in recliner, son at bedside, pt agreeable to PT session without reports of pain. Performed stand<>Pivot transfer with heavy modA from recliner to w/c, towards weaker L side. Cues for sequencing, stepping pattern, and hand placement, requiring R knee block for safety. W/c transport to main therapy gym and wheeled inside // bars. Performed sit<>stand with modA from w/c to // bars and R knee block. Required hand-over-hand assist for RUE grip to // bar. Performed lateral weight shifts L<>R in // bars with minA for balance, maintaining R knee block. Performed forward/backward stepping in // bars with mod/maxA with therapist blocking R knee and facilitating pelvic rotation for L stepping. She continues to show significant forward flexed trunk while attempting to step and difficulty motor planning impacting her ability to produce efficient steps. She also was complaining of L knee and lower back pain during weight bearing. Performed squat<>pivot transfer with max/totalA from w/c to mat table. Performed forward reaching with targeted cones with therapist facilitating forward weight shift and weight bearing through both RUE and RLE. She then performed repeated mini-stands with modA and R knee block from raised mat table height. Once she understood this movement pattern, incorporating half stands + scooting to assist with  carryover into functional squat pivot transfers. She required maxA for scooting and had decreased buttock clearance from mat table. Cues throughout for head/hip, foot/hand placement, and general sequencing. Performed squat<>pivot transfer with maxA from raised EOM height to w/c and she was returned to her room where she remained seated in w/c with 1/2 lap tray supporting RUE, needs in reach.  2nd session: Pt received sitting in recliner, sleeping on arrival but awakens easily to voice, agreeable to PT session. She c/o mild R lower back pain. RN was notified who provided pain medication. Son requesting that she return to the recliner instead of sitting in w/c once she finished with PT. Pt also reports moderate fatigue, contributes to medications. BP assessed to rule out hypotension, BP reading 138/46 and RN was made aware. Performed stand<>pivot transfer with maxA from recliner to w/c. She was transported to [employer] and required maxA for stand<>pivot to Nustep. She completed x6 minues (requiring x2 rest breaks at 2 min and 4 min mark) at workload of 1, focusing on RLE hip/knee extension. She required continuous tactile feedback for R knee control and Nustep was very effortful for her with slow strokes. Performed stand<>pivot with maxA back to her w/c and she was returned to her room with totalA for time management. Performed another stand<>pivot with maxA back to her recliner where she remained at end of session with safety belt alarm on, BLE elevated, RUE supported with pillow, needs in reach.   Therapy Documentation Precautions:  Precautions Precautions: Fall Precaution Comments: R-hemi; watch for subluxation Restrictions Weight Bearing Restrictions: No  Therapy/Group: Individual Therapy  [name] [name] P [name] [name] PT 01/27/2020, 12:20 PM

## 2020-01-27 NOTE — Progress Notes (Signed)
Speech Language Pathology Daily Session Note  Patient Details  Name: [name] [name] MRN: [id number] Date of Birth: 16-Feb-1933  Today's Date: 01/27/2020 SLP Individual Time: 1200-1300 SLP Individual Time Calculation (min): 60 min  Short Term Goals: Week 2: SLP Short Term Goal 1 (Week 2): Patient will consume trials of Dys 3 solids without overt coughing/throat clearing and with full clearance of oral cavity, with SLP only, with minA. SLP Short Term Goal 2 (Week 2): Patient will demonstrate recall and awareness to specific areas of progress (cognitive, swallow, physical) with minA cues. SLP Short Term Goal 3 (Week 2): Patient will perform mild complex functional tasks (money management, medication management, etc) with modA cues. SLP Short Term Goal 4 (Week 2): Patient will achieve 90% intelligibility of speech at conversational level with minA cues  Skilled Therapeutic Interventions:Skilled ST services focused on swallow and cognitive skills. Pt was very fatigued and required max A verbal cues in 1-3 minute intervals to remain alert. SLP facilitated basic problem solving, recall and scanning to right skills in familiar ALFA money task. Pt demonstrated somewhat adequate problem solving skills, but required max A verbal and written cues for sustained attention and recall. Pt consumed lunch tray of recently upgraded dys 3 textures and thin liquid diet set up assist, mod I to utilize swallow strategies and supervision A verbal cues to locate items on the right. Pt demonstrated increase sustained attention during self-feeding but eyes remained closed during mastication. Pt also consumed trial of regular textures snack, noting slight prolonged mastication but appropriate oral clearance and swallow appeared timely. Pt requested a snack trial of "chips" in up coming sessions. Pt was left in room with son, call bell within reach and chair alarm set. SLP recommends to continue skilled services.      Pain Pain Assessment Pain Score: 0-No pain  Therapy/Group: Individual Therapy  Elmer Merwin  [facility] 01/27/2020, 5:15 PM

## 2020-01-27 NOTE — Progress Notes (Signed)
[place] PHYSICAL MEDICINE & REHABILITATION PROGRESS NOTE   Subjective/Complaints: Patient seen sitting up in bed this morning.  She states she slept well overnight.  Son at bedside.  She states she missed me yesterday.  She states she has not had a bowel movement in 2 days.  She is pleased with her more upgraded diet.  Review of systems: Denies CP, SOB, N/V/D  Objective:   No results found. Recent Labs    01/26/20 0726  WBC 5.6  HGB 12.3  HCT 38.4  PLT 245   Recent Labs    01/26/20 0726  NA 137  K 3.7  CL 99  CO2 24  GLUCOSE 131*  BUN 32*  CREATININE 1.73*  CALCIUM 9.0    Intake/Output Summary (Last 24 hours) at 01/27/2020 1326 Last data filed at 01/26/2020 1850 Gross per 24 hour  Intake 240 ml  Output --  Net 240 ml        Physical Exam: Vital Signs Blood pressure (!) 141/39, pulse 68, temperature 98.9 F (37.2 C), resp. rate 16, height 5\' 3"  (1.6 m), weight 101.3 kg, SpO2 96 %. Constitutional: No distress . Vital signs reviewed. HENT: Normocephalic.  Atraumatic. Eyes: EOMI. No discharge. Cardiovascular: No JVD.  RRR. Respiratory: Normal effort.  No stridor.  Bilateral clear to auscultation. GI: Non-distended.  BS +. Skin: Warm and dry.  Intact. Psych: Normal mood.  Normal behavior. Musc: No edema in extremities.  No tenderness in extremities. Neuro: Alert Motor: RUE: 2+/5 proximal to distal with apraxia RLE: 2 -/5 proximal to distal with apraxia, stable  Assessment/Plan: 1. Functional deficits which require 3+ hours per day of interdisciplinary therapy in a comprehensive inpatient rehab setting.  Physiatrist is providing close team supervision and 24 hour management of active medical problems listed below.  Physiatrist and rehab team continue to assess barriers to discharge/monitor patient progress toward functional and medical goals  Care Tool:  Bathing    Body parts bathed by patient: Chest, Abdomen, Right upper leg, Left upper leg, Face,  Right arm   Body parts bathed by helper: Right arm, Chest, Abdomen, Right upper leg, Left upper leg, Right lower leg, Left lower leg, Face, Left arm, Front perineal area Body parts n/a: Buttocks   Bathing assist Assist Level: Minimal Assistance - Patient > 75%     Upper Body Dressing/Undressing Upper body dressing   What is the patient wearing?: Pull over shirt    Upper body assist Assist Level: Set up assist    Lower Body Dressing/Undressing Lower body dressing      What is the patient wearing?: Incontinence brief, Pants     Lower body assist Assist for lower body dressing: Moderate Assistance - Patient 50 - 74%     Toileting Toileting Toileting Activity did not occur (Clothing management and hygiene only): N/A (no void or bm)  Toileting assist Assist for toileting: Moderate Assistance - Patient 50 - 74%     Transfers Chair/bed transfer  Transfers assist     Chair/bed transfer assist level: Maximal Assistance - Patient 25 - 49%     Locomotion Ambulation   Ambulation assist   Ambulation activity did not occur: Safety/medical concerns  Assist level: Maximal Assistance - Patient 25 - 49% Assistive device: Parallel bars Max distance: 8   Walk 10 feet activity   Assist  Walk 10 feet activity did not occur: Safety/medical concerns        Walk 50 feet activity   Assist Walk 50 feet with  2 turns activity did not occur: Safety/medical concerns         Walk 150 feet activity   Assist Walk 150 feet activity did not occur: Safety/medical concerns         Walk 10 feet on uneven surface  activity   Assist Walk 10 feet on uneven surfaces activity did not occur: Safety/medical concerns         Wheelchair     Assist Will patient use wheelchair at discharge?: Yes Type of Wheelchair: Manual    Wheelchair assist level: Maximal Assistance - Patient 25 - 49% Max wheelchair distance: 25    Wheelchair 50 feet with 2 turns  activity    Assist        Assist Level: Total Assistance - Patient < 25%   Wheelchair 150 feet activity     Assist      Assist Level: Total Assistance - Patient < 25%   Blood pressure (!) 141/39, pulse 68, temperature 98.9 F (37.2 C), resp. rate 16, height 5\' 3"  (1.6 m), weight 101.3 kg, SpO2 96 %.  Medical Problem List and Plan: 1. 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  Continue CIR  WHO/PRAFO nightly 2. Antithrombotics: -DVT/anticoagulation:Pharmaceutical:Heparin -antiplatelet therapy: N/a 3. Pain Management:Tylenol as needed. Denies pain.  4. Mood:LCSW to follow for evaluation and support when appropriate. -antipsychotic agents: N/A  Seroquel prn HS 25mg  per son.   Nursing order for lavender drops on forehead at night.   Improving 5. Neuropsych: This patientis notcapable of making decisions on herown behalf. 6. Skin/Wound Care:Routine pressure-relief measures. Maintain adequate nutrition and hydration status. 7. Fluids/Electrolytes/Nutrition:Monitor intake/output--  Will need assistance with feeding as due to fluctuating bouts of lethargy.  8. HTN: Monitor BP --continue Lasix, Hydralazine, Metoprolol  HCTZ changed to chlorthalidone on 11/25             Vitals:   01/26/20 1958 01/27/20 0420  BP: (!) 166/52 (!) 141/39  Pulse: 62 68  Resp: 16   Temp: 98.2 F (36.8 C) 98.9 F (37.2 C)  SpO2: 96% 96%   Mildly elevated on 11/30, will consider increasing medications if persistent 9. Acute on chronic renal failure: BUN/SCr 34/1.5 at admission.  Decreased lasix to 10mg   Creatinine 1.73 on 11/29  Encourage fluids 10. PNA?/Tracheobronchitis: Completed course of antibiotics on  11. Leukocytosis: Resolved   12. T2DM  with hyperglycemia:  Monitor BS ac/hs.   CBG (last 3)  Recent Labs    01/26/20 2107 01/27/20 0600 01/27/20 1148  GLUCAP 268* 202* 246*   Lantus decreased to 48U, decreased to 35 twice daily on 11/23, decreased to 28 on 11/24, increased to 30 twice daily on 11/30 13. Post stroke dysphagia :             D3 thins, continue to advance diet as tolerated. 14.  Hyperkalemia  Potassium 3.7 on 11/29 15.  Slow transit constipation  Bowel meds increased on 11/28  Improving  LOS: 11 days A FACE TO FACE EVALUATION WAS PERFORMED  Pranav Lince 12/30 01/27/2020, 1:26 PM

## 2020-01-28 ENCOUNTER — Inpatient Hospital Stay (HOSPITAL_COMMUNITY): Payer: Medicare Other

## 2020-01-28 ENCOUNTER — Inpatient Hospital Stay (HOSPITAL_COMMUNITY): Payer: Medicare Other | Admitting: Occupational Therapy

## 2020-01-28 ENCOUNTER — Inpatient Hospital Stay (HOSPITAL_COMMUNITY): Payer: Medicare Other | Admitting: *Deleted

## 2020-01-28 DIAGNOSIS — E1165 Type 2 diabetes mellitus with hyperglycemia: Secondary | ICD-10-CM | POA: Diagnosis not present

## 2020-01-28 DIAGNOSIS — R0989 Other specified symptoms and signs involving the circulatory and respiratory systems: Secondary | ICD-10-CM | POA: Diagnosis not present

## 2020-01-28 DIAGNOSIS — F411 Generalized anxiety disorder: Secondary | ICD-10-CM | POA: Diagnosis not present

## 2020-01-28 DIAGNOSIS — I619 Nontraumatic intracerebral hemorrhage, unspecified: Secondary | ICD-10-CM | POA: Diagnosis not present

## 2020-01-28 LAB — GLUCOSE, CAPILLARY
Glucose-Capillary: 214 mg/dL — ABNORMAL HIGH (ref 70–99)
Glucose-Capillary: 325 mg/dL — ABNORMAL HIGH (ref 70–99)
Glucose-Capillary: 275 mg/dL — ABNORMAL HIGH (ref 70–99)
Glucose-Capillary: 398 mg/dL — ABNORMAL HIGH (ref 70–99)

## 2020-01-28 MED ORDER — DICLOFENAC SODIUM 1 % EX GEL
2.0000 g | Freq: Four times a day (QID) | CUTANEOUS | Status: DC
  Administered 2020-01-28 – 2020-02-12 (×49): 2 g via TOPICAL
  Filled 2020-01-28: qty 100

## 2020-01-28 MED ORDER — BETAMETHASONE SOD PHOS & ACET 6 (3-3) MG/ML IJ SUSP
6.0000 mg | Freq: Once | INTRAMUSCULAR | Status: AC
  Administered 2020-01-28: 6 mg via INTRA_ARTICULAR
  Filled 2020-01-28: qty 1

## 2020-01-28 MED ORDER — AMANTADINE HCL 50 MG/5ML PO SOLN
50.0000 mg | Freq: Two times a day (BID) | ORAL | Status: DC
  Administered 2020-01-28 – 2020-02-06 (×18): 50 mg via ORAL
  Filled 2020-01-28 (×20): qty 5

## 2020-01-28 MED ORDER — LIDOCAINE HCL (PF) 2 % IJ SOLN
4.0000 mL | Freq: Once | INTRAMUSCULAR | Status: AC
  Administered 2020-01-28: 4 mL
  Filled 2020-01-28 (×2): qty 4

## 2020-01-28 NOTE — Progress Notes (Signed)
CBG was collected as 325, MD Patel notified of patient BS, insulin coverage give. No new orders noted. Will reassess.

## 2020-01-28 NOTE — Progress Notes (Signed)
Speech Language Pathology Daily Session Note  Patient Details  Name: [name] [name] MRN: [id number] Date of Birth: 1932-11-07  Today's Date: 01/28/2020 SLP Individual Time: 1402-1500 SLP Individual Time Calculation (min): 58 min  Short Term Goals: Week 2: SLP Short Term Goal 1 (Week 2): Patient will consume trials of Dys 3 solids without overt coughing/throat clearing and with full clearance of oral cavity, with SLP only, with minA. SLP Short Term Goal 2 (Week 2): Patient will demonstrate recall and awareness to specific areas of progress (cognitive, swallow, physical) with minA cues. SLP Short Term Goal 3 (Week 2): Patient will perform mild complex functional tasks (money management, medication management, etc) with modA cues. SLP Short Term Goal 4 (Week 2): Patient will achieve 90% intelligibility of speech at conversational level with minA cues  Skilled Therapeutic Interventions:Skilled ST services focused on swallow and cognitive skills. Pt was receiving injunction in knee for pain by MD when SLP entered. SLP noted dramatic improvement in alertness following the d/c of sleep aid medication. Pt consumed regular texture snack, chips with ability to clear oral cavity and no overt s/s aspiration. Pt demonstrated mild anterior right spillage that was corrected mod I with placement of mirror in front of pt. SLP facilitated basic to mildly complex problem solving and error awareness in PEG design task, pt required min A verbal cues fade to mod I. Pt demonstrated improved problem solving skills, error awareness skills and recall with written aid in basic and familiar ALFA money management task, requiring only min A verbal cues. Pt demonstrated overall sustained attention in 30 minute internals with min A verbal cues. Pt expressed increase "clarity" in mentation. Pt was left in room with call bell within reach and bed alarm set. SLP recommends to continue skilled services.     Pain Pain  Assessment Pain Score: 0-No pain  Therapy/Group: Individual Therapy  [name]  [facility] 01/28/2020, 3:10 PM

## 2020-01-28 NOTE — Procedures (Signed)
Lidocaine: [id number] Exp: Jun 2024  Knee injection   Indication: Knee pain not relieved by medication management and other conservative care.  Informed consent was obtained after describing risks and benefits of the procedure with the patient, this includes bleeding, bruising, infection and medication side effects. The patient wishes to proceed and has given written consent. Patient was placed in a seated position. The right knee was marked and prepped with betadine in the anteromedial area. A 23-gauge 1-1/2 inch needle was inserted into the joint space. After negative draw back for blood, a solution containing 1 mL of 6 mg per ML celestone and 4 mL of 2% lidocaine was injected. A band aid was applied. The patient tolerated the procedure well. Post procedure instructions were given.

## 2020-01-28 NOTE — Patient Care Conference (Signed)
Inpatient RehabilitationTeam Conference and Plan of Care Update Date: 01/28/2020   Time: 11:04 AM    Patient Name: [name] [name]      Medical Record Number: [id number]  Date of Birth: 08/30/1932 Sex: Female         Room/Bed: 4W23C/4W23C-01 Payor Info: Payor: Advertising copywriter MEDICARE / Plan: [employer] MEDICARE / Product Type: *No Product type* /    Admit Date/Time:  01/16/2020  4:29 PM  Primary Diagnosis:  Nontraumatic acute hemorrhage of basal ganglia [facility])  Hospital Problems: Principal Problem:   Nontraumatic acute hemorrhage of basal ganglia (HCC) Active Problems:   Controlled type 2 diabetes mellitus with hyperglycemia (HCC)   Labile blood glucose   Hyperkalemia   AKI (acute kidney injury) (HCC)   Anxiety state   Hypoglycemia   Labile blood pressure   Slow transit constipation    Expected Discharge Date: Expected Discharge Date: 02/12/20  Team Members Present: Physician leading conference: Dr. Maryla Morrow Care Coodinator Present: Chana Bode, RN, BSN, CRRN;Becky Dupree, LCSW Nurse Present: Other (comment) Freddrick March, RN) PT Present: Wynelle Link, PT OT Present: Dolphus Jenny, OT SLP Present: Colin Benton, SLP PPS Coordinator present : Fae Pippin, [place], PT     Current Status/Progress Goal Weekly Team Focus  Bowel/Bladder   continent of b/b; LBM: 11/30  remain continent  assist with toileting needs prn   Swallow/Nutrition/ Hydration   Dys 3 textures and thin liquids, supervision-mod I  Mod I  carryover of swallow strategies and regular trials   ADL's   modA stedy transfers, mod/max sit<>stand at sink, setup/supervision UB bathing/dressing, mod/maxA LB self care  min assist; possibly downgrade goals soon  self care training, functional transfers, NMR RUE, hemitechnique training   Mobility   maxA bed mobility, max/totalA squat<>pivot and stand<>pivot transfers. Sitting balance supervision. max/totalA gait in // bars  minA (will likely  downgrade to modA)  Functional transfers, R hemibody neuro re-ed, pre-gait training, standing balance, improving activity tolerance as she is still very deconditioned   Communication   min A, impacted by lethargy  Supervision A      Safety/Cognition/ Behavioral Observations  max-mod A, impacted by lethargy  Min-Supervision A  basic-mildly complex problem solving, error awareness, sustained attention and recall   Pain   no c/o pain  remain pain free  assess QS and prn   Skin   skin intact  Maintain skin integrity  assess QS and prn     Discharge Planning:  Family committed to pt and plan to take her home, but concerned she will not meet goals set for her. ques extention of discharge date.   Team Discussion: Blood pressure mildly elevated; MD adjusting meds. Fluctuating CBGs; monitored. Pain in hip and back reported and progress also limited by lethargy and fatigue, lack of motivation. MD to initiate medications for alertness. MD to assess need for injection of knee; knee surgery on hold due to stroke. Patient with slow recovery of upper extremity. Patient on target to meet rehab goals: No, adjusting goals and plan for min assist however currently mod assist for transfers with lift equipment, max assist with out assistive devices  *See Care Plan and progress notes for long and short-term goals.   Revisions to Treatment Plan:  Slideboard transfer trials Modify therapy schedule to treat in the morning and allow for nap in the afternoon as noted PTA.   Teaching Needs: Transfers, toileting, medications, etc.  Current Barriers to Discharge: Decreased caregiver support, Home enviroment access/layout, Insurance for  SNF coverage and follow up services  Possible Resolutions to Barriers: Extended stay to allow time for progress and adjusted goals Family education      Medical Summary Current Status: Rightt hemiparesis with inattention, cognitive deficits decreased initiation and delayed  processing, dysphagia as well as expressive deficits affecting ability to carry out ADLs and mobility secondary to acute left thalamic and basal ganglia hemorrhage  Barriers to Discharge: Medical stability;Behavior   Possible Resolutions to [employer] Focus: Therapies, Follow labs - Cr, optimize BP meds, optimize DM meds   Continued Need for Acute Rehabilitation Level of Care: The patient requires daily medical management by a physician with specialized training in physical medicine and rehabilitation for the following reasons: Direction of a multidisciplinary physical rehabilitation program to maximize functional independence : Yes Medical management of patient stability for increased activity during participation in an intensive rehabilitation regime.: Yes Analysis of laboratory values and/or radiology reports with any subsequent need for medication adjustment and/or medical intervention. : Yes   I attest that I was present, lead the team conference, and concur with the assessment and plan of the team.   Chana Bode B 01/28/2020, 3:43 PM

## 2020-01-28 NOTE — Progress Notes (Signed)
Patient ID: KIMARIE COOR, female   DOB: 1932/05/16, 86 y.o.   MRN: [id number] Met with pt and her daughter who was present in the room to discuss team conference and the progress toward her goals. Again informed them she would require 24/7 physical care-min level and discharge. Daughter wants to make sure they can manage her since the son's will not take her to the bathroom while caring for her due to their mom and pt also does not want them too. Made aware have extended her discharge one more week until the 16 th to make progress. Discussed MD may inject her knee since this is causing her pain and limiting her in therapies along with her lethargy. She is glad to be off the trazodone which makes her groggy in the am. Discussed her need to push herself in therapies to get the maximum benefit while here. She feels if the therapists can build in a nap in the afternoon she would do better with them. She took a nap at home prior to admission. Will work on discharge needs. Family does want to take her home and are familiar with using the transfer board since they used one with their Dad.

## 2020-01-28 NOTE — Progress Notes (Signed)
Occupational Therapy Session Note  Patient Details  Name: [name] [name] MRN: [id number] Date of Birth: 04-29-1932  Today's Date: 01/28/2020 OT Individual Time: 1300-1345 OT Individual Time Calculation (min): 45 min    Short Term Goals: Week 2:  OT Short Term Goal 1 (Week 2): Pt will complete LB dressing using AE as needed with min assist OT Short Term Goal 2 (Week 2): Pt will complete sit to stand with min assist OT Short Term Goal 3 (Week 2): Pt will achive 45 degrees active right shoulder scaption to assist with self care. OT Short Term Goal 4 (Week 2): Pt will complete toileting with min assist.  Skilled Therapeutic Interventions/Progress Updates:    Pt sitting in recliner with dtr in room throughout session, no c/o pain.  Pt participated in NMR of RUE shoulder, elbow, forearm, wrist, and hand.  Provided manual positioning, TCs, and VCs to facilitate motor learning and muscle recruitment.  Pt completed 2 x 10 reps of shoulder shrugs, scaption, ER/IR, FF/ext sliding hand on top of leg, elbow flex/ext (gravity eliminated), FA pro/sup, wrist flex/ext, digital flex/ext.  Applied kinesiotape to right wrist and hand to promote edema reduction.  Pt returned to sitting EOB using stedy transfer with mod assist in preperation for MD consultation. Pt left with nurse tech and Dr. Allena Katz present.  Therapy Documentation Precautions:  Precautions Precautions: Fall Precaution Comments: R-hemi; watch for subluxation Restrictions Weight Bearing Restrictions: No   Therapy/Group: Individual Therapy  Amie Critchley 01/28/2020, 4:23 PM

## 2020-01-28 NOTE — Progress Notes (Signed)
Physical Therapy Session Note  Patient Details  Name: [name] [name] MRN: [id number] Date of Birth: 1932-04-23  Today's Date: 01/28/2020 PT Individual Time: 9381-8299 + 1135-1200 PT Individual Time Calculation (min): 57 min + 25 min  Short Term Goals: Week 2:  PT Short Term Goal 1 (Week 2): Pt will complete bed mobility with no more than modA PT Short Term Goal 2 (Week 2): Pt will perform bed<>chair transfers with modA and LRAD PT Short Term Goal 3 (Week 2): Pt will ambulate 44ft with modA +2 with LRAD  Skilled Therapeutic Interventions/Progress Updates:   1st session:   Pt greeted semi-reclined in bed, daughter at bedside, pt agreeable to PT session. Reports mild low back pain. Rest breaks and mobility provided for pain management. Pt reports good nights rest, appears to have more energy levels compared to yesterday. Donned tennis shoes with totalA while supine for time management. Performed supine<>sit with min/modA with HOB flat, use of bedrail, needed assist primarily for trunk management. Performed lateral scooting, towards her L, with modA on EOB. Performed squat<>pivot transfer with max/totalA from EOB to w/c with cues for head/hip and general sequencing, continues to show limited buttock clearance during transfer. W/c transport for energy conservation from her room to ortho gym. Therapist retrieved sliding board and therapist performed x3 sliding board transfers with modA from w/c to/from mat table. Education on sliding board purposes, placement, and technique. She feels comfortable with sliding board as she used this with her late husband several years ago; she also reports that family will feel comfortable too. She performed x3 sliding board transfers with modA from w/c to mat table with assist for sliding board placement, hip translation, general sequencing, and forward lean. She also performed repeated sit<>stands with mod/maxA with R knee block from lowered mat table to AD, cues for  forward weight shift and hip extension. She required extra time for initiating trunk extension to reach full standing position. While in standing, she required min/modA for balance and completed LUE functional reaching across midline and forward. Posterior bias noted in standing with difficulty self correcting. Performed sliding board transfer back to her w/c with modA and was returned to her room with totalA for time management. She completed stand<>pivot transfer with maxA from w/c to recliner and she remained seated in recliner with needs in reach, RUE supported with pillow, and safety alarm on.  2nd session: Pt greeted seated in recliner, agreeable to PT to session. Reports mild L back pain, redirectable and mobility provided for pain relief. Performed stand<>pivot transfer with maxA from recliner to w/c with assist for L foot advancement and R knee blocking to prevent buckling. W/c transport to day room gym for time management. Performed x2 sliding board transfers with mod/maxA from w/c to mat table with cues throughout for head/hip, sequencing, hand/foot placement, and technique. Required therapist facilitation for forward weight shift and hip translation. Also required assist for R foot placement. Returned to her room where she performed another stand<>pivot with maxA from w/c to recliner in similar fashion as outlined above. She remained seated in recliner with safety belt alarm on, needs in reach, BLE elevated, made comfortable.  Therapy Documentation Precautions:  Precautions Precautions: Fall Precaution Comments: R-hemi; watch for subluxation Restrictions Weight Bearing Restrictions: No  Therapy/Group: Individual Therapy  Gal Smolinski P Monique Gift PT 01/28/2020, 9:57 AM

## 2020-01-28 NOTE — Progress Notes (Signed)
[place] PHYSICAL MEDICINE & REHABILITATION PROGRESS NOTE   Subjective/Complaints: Patient seen sitting up in bed this AM.  She states she slept well overnight without meds.  Daughter at bedside, she has questions regarding extending her stay.   Review of systems: Denies CP, SOB, N/V/D  Objective:   No results found. Recent Labs    01/26/20 0726  WBC 5.6  HGB 12.3  HCT 38.4  PLT 245   Recent Labs    01/26/20 0726  NA 137  K 3.7  CL 99  CO2 24  GLUCOSE 131*  BUN 32*  CREATININE 1.73*  CALCIUM 9.0    Intake/Output Summary (Last 24 hours) at 01/28/2020 1104 Last data filed at 01/28/2020 0841 Gross per 24 hour  Intake 840 ml  Output --  Net 840 ml        Physical Exam: Vital Signs Blood pressure (!) 121/50, pulse 60, temperature 98.3 F (36.8 C), temperature source Oral, resp. rate 18, height 5\' 3"  (1.6 m), weight 101.3 kg, SpO2 97 %. Constitutional: No distress . Vital signs reviewed. HENT: Normocephalic.  Atraumatic. Eyes: EOMI. No discharge. Cardiovascular: No JVD.  RRR. Respiratory: Normal effort.  No stridor.  Bilateral clear to auscultation. GI: Non-distended.  BS +. Skin: Warm and dry.  Intact. Psych: Normal mood.  Normal behavior. Musc: No edema in extremities.  No tenderness in extremities. Neuro: Alert Motor: RUE: 2+/5 proximal to distal with apraxia, unchanged RLE: 2 -/5 proximal to distal with apraxia, unchanged  Assessment/Plan: 1. Functional deficits which require 3+ hours per day of interdisciplinary therapy in a comprehensive inpatient rehab setting.  Physiatrist is providing close team supervision and 24 hour management of active medical problems listed below.  Physiatrist and rehab team continue to assess barriers to discharge/monitor patient progress toward functional and medical goals  Care Tool:  Bathing    Body parts bathed by patient: Chest, Abdomen, Right upper leg, Left upper leg, Face, Right arm   Body parts bathed by  helper: Right arm, Chest, Abdomen, Right upper leg, Left upper leg, Right lower leg, Left lower leg, Face, Left arm, Front perineal area Body parts n/a: Buttocks   Bathing assist Assist Level: Minimal Assistance - Patient > 75%     Upper Body Dressing/Undressing Upper body dressing   What is the patient wearing?: Pull over shirt    Upper body assist Assist Level: Set up assist    Lower Body Dressing/Undressing Lower body dressing      What is the patient wearing?: Incontinence brief, Pants     Lower body assist Assist for lower body dressing: Moderate Assistance - Patient 50 - 74%     Toileting Toileting Toileting Activity did not occur (Clothing management and hygiene only): N/A (no void or bm)  Toileting assist Assist for toileting: Moderate Assistance - Patient 50 - 74%     Transfers Chair/bed transfer  Transfers assist     Chair/bed transfer assist level: Maximal Assistance - Patient 25 - 49%     Locomotion Ambulation   Ambulation assist   Ambulation activity did not occur: Safety/medical concerns  Assist level: Maximal Assistance - Patient 25 - 49% Assistive device: Parallel bars Max distance: 8   Walk 10 feet activity   Assist  Walk 10 feet activity did not occur: Safety/medical concerns        Walk 50 feet activity   Assist Walk 50 feet with 2 turns activity did not occur: Safety/medical concerns  Walk 150 feet activity   Assist Walk 150 feet activity did not occur: Safety/medical concerns         Walk 10 feet on uneven surface  activity   Assist Walk 10 feet on uneven surfaces activity did not occur: Safety/medical concerns         Wheelchair     Assist Will patient use wheelchair at discharge?: Yes Type of Wheelchair: Manual    Wheelchair assist level: Maximal Assistance - Patient 25 - 49% Max wheelchair distance: 25    Wheelchair 50 feet with 2 turns activity    Assist        Assist Level:  Total Assistance - Patient < 25%   Wheelchair 150 feet activity     Assist      Assist Level: Total Assistance - Patient < 25%   Blood pressure (!) 121/50, pulse 60, temperature 98.3 F (36.8 C), temperature source Oral, resp. rate 18, height 5\' 3"  (1.6 m), weight 101.3 kg, SpO2 97 %.  Medical Problem List and Plan: 1. 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  Continue CIR  WHO/PRAFO nightly  Team conference today to discuss current and goals and coordination of care, home and environmental barriers, and discharge planning with nursing, case manager, and therapies. Please see conference note from today as well.  2. Antithrombotics: -DVT/anticoagulation:Pharmaceutical:Heparin -antiplatelet therapy: N/a 3. Pain Management:Tylenol as needed.   Voltaren gel ordered  Will plan to injection right knee (supposed to have knee replacement PTA) 4. Mood:LCSW to follow for evaluation and support when appropriate. -antipsychotic agents: N/A  Seroquel prn HS 25mg  per son.   Nursing order for lavender drops on forehead at night.   Amantadine 50 started on 12/1 5. Neuropsych: This patientis notcapable of making decisions on herown behalf. 6. Skin/Wound Care:Routine pressure-relief measures. Maintain adequate nutrition and hydration status. 7. Fluids/Electrolytes/Nutrition:Monitor intake/output--  Will need assistance with feeding as due to fluctuating bouts of lethargy.  8. HTN: Monitor BP --continue Lasix, Hydralazine, Metoprolol  HCTZ changed to chlorthalidone on 11/25             Vitals:   01/28/20 0512 01/28/20 0801  BP: (!) 139/43 (!) 121/50  Pulse:  60  Resp:    Temp:    SpO2:     Labile on 12/1, monitor for trend 9. Acute on chronic  kidney injury: BUN/SCr 34/1.5 at admission.  Decreased lasix to 10mg   Creatinine 1.73 on 11/29  Encourage fluids 10. PNA?/Tracheobronchitis: Completed course of antibiotics on  11. Leukocytosis: Resolved   12. T2DM with hyperglycemia:  Monitor BS ac/hs.   CBG (last 3)  Recent Labs    01/27/20 1651 01/27/20 2109 01/28/20 0608  GLUCAP 192* 212* 214*   Lantus decreased to 48U, decreased to 35 twice daily on 11/23, decreased to 28 on 11/24, increased to 30 twice daily on 11/30  Remains elevated, will consider further increase tomorrow if persistent 13. Post stroke dysphagia :             D3 thins, continue to advance diet as tolerated. 14.  Hyperkalemia  Potassium 3.7 on 11/29 15.  Slow transit constipation  Bowel meds increased on 11/28  Improving 16. Sleep disturbance  AM lethargy with Trazodone, d/ced  Improving  LOS: 12 days A FACE TO FACE EVALUATION WAS PERFORMED  [name] [name] 12/30 01/28/2020,  11:04 AM

## 2020-01-28 NOTE — Evaluation (Signed)
Recreational Therapy Assessment and Plan  Patient Details  Name: [name] [name] MRN: [id number] Date of Birth: 02/21/1933 Today's Date: 01/28/2020  Rehab Potential:  Good ELOS:   12/16  Assessment   Hospital Problem: Principal Problem:   Nontraumatic acute hemorrhage of basal ganglia ([place])   Past Medical History:      Past Medical History:  Diagnosis Date  . Colon polyp   . Diabetes mellitus 1992  . Hypertension 1996  . Pancreatitis    Past Surgical History:       Past Surgical History:  Procedure Laterality Date  . CHOLECYSTECTOMY    . COLONOSCOPY    . ERCP    . PARTIAL HYSTERECTOMY  86years old  . POLYPECTOMY    . UPPER GASTROINTESTINAL ENDOSCOPY    . WRIST FRACTURE SURGERY  left arm,86years old    Assessment & Plan Clinical Impression: Patient is a 86 y.o. year old female withhistory of T2DM, CKD (Dr. Lorrene Reid) HTN, CHF,morbid obesity--BMI 41;who was admitted on 01/04/2020 after collapsing at church during her service. History taken from chart review due to aphasia. On admission she was found to have right hemiparesis with dysarthria. 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 She was started on Cleviprex and labetalol for BP control and required intubation for airway protection due to progressive decline in LOC. Acute hypoxic/hypercapnic respiratory failure felt to be due tohemorrhagic stroke was able to tolerate extubation on 01/06/2020.Follow-up MRI/MRI brain showed no visible mass or vascular lesion, extensive small vessel ischemia and acute left thalamic bleed without significant change. Carotid Dopplers were negative for significant ICA stenosi.Echocardiogram with EF of 55-60% with no wal abnormality and moderate mitral annular calcification. Tube feeds initiated for nutritional support.IV abx added on 01/08/2020 due to ongoing  lethargy as well as low-grade fevers with leukocytosiswith WBC-15.3. Blood cultures x2 from11/10 were negative for growth.  On 01/13/2020,she was noted to have change in mental status with significant somnolence and inability to follow any commands.Repeat CT head showed subacute left thalamic hematoma without acute or interval findings. Dr. Malen Gauze question seizures as cause for mental status change and EEG ordered which was negative for seizures or epileptiform discharges. Her mentation is slowly improving with increase in verbal output and diet has been advanced to dysphagia #2, thin liquids. She has had steady worsening of renal status with recurrent leucocytosis. Mentation continues to fluctuate. Cortak removed today. Therapy cotreatment ongoing with patient showing ability to activate RLE and stand in steady. She continues to have limitations due to right hemiparesis with inattention, cognitive deficits decreased initiation and delayed processing as well as expressive deficits affecting ability to carry out ADLs and mobility. CIR was recommended due to functional decline. Please see preadmission assessment from earlier today as well.    Met with pt and daughter today per team referral.  Discussed leisure/commuity pursuits, activity analysis/activity modifications.  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  Pts daughter was encouraging to pt throughout discussion.   Pt presents with decreased activity tolerance, decreased functional mobility, decreased balance, decreased coordination, decreased attention, decreased awareness, decreased problem solving, decreased safety awareness, decreased memory and delayed processing, feelings of stress Limiting pt's independence with  leisure/community pursuits.  Plan    Min 1 TR session >20 minutes per week during LOS  Recommendations for other services: None   Discharge Criteria: Patient will be discharged from TR if patient refuses treatment 3 consecutive times without medical reason.  If treatment goals not met, if there is a change in medical status, if patient makes no progress towards goals or if patient is discharged from hospital.  The above assessment, treatment plan, treatment alternatives and goals were discussed and mutually agreed upon: by patient  [place] 01/28/2020, 1:45 PM

## 2020-01-29 ENCOUNTER — Inpatient Hospital Stay (HOSPITAL_COMMUNITY): Payer: Medicare Other | Admitting: Speech Pathology

## 2020-01-29 ENCOUNTER — Inpatient Hospital Stay (HOSPITAL_COMMUNITY): Payer: Medicare Other

## 2020-01-29 ENCOUNTER — Inpatient Hospital Stay (HOSPITAL_COMMUNITY): Payer: Medicare Other | Admitting: Occupational Therapy

## 2020-01-29 DIAGNOSIS — M1711 Unilateral primary osteoarthritis, right knee: Secondary | ICD-10-CM

## 2020-01-29 LAB — BASIC METABOLIC PANEL
Anion gap: 12 (ref 5–15)
BUN: 28 mg/dL — ABNORMAL HIGH (ref 8–23)
CO2: 25 mmol/L (ref 22–32)
Calcium: 9.4 mg/dL (ref 8.9–10.3)
Chloride: 98 mmol/L (ref 98–111)
Creatinine, Ser: 1.59 mg/dL — ABNORMAL HIGH (ref 0.44–1.00)
GFR, Estimated: 32 mL/min — ABNORMAL LOW (ref >60)
Glucose, Bld: 366 mg/dL — ABNORMAL HIGH (ref 70–99)
Potassium: 4.8 mmol/L (ref 3.5–5.1)
Sodium: 135 mmol/L (ref 135–145)

## 2020-01-29 LAB — GLUCOSE, CAPILLARY
Glucose-Capillary: 349 mg/dL — ABNORMAL HIGH (ref 70–99)
Glucose-Capillary: 327 mg/dL — ABNORMAL HIGH (ref 70–99)
Glucose-Capillary: 349 mg/dL — ABNORMAL HIGH (ref 70–99)
Glucose-Capillary: 313 mg/dL — ABNORMAL HIGH (ref 70–99)

## 2020-01-29 MED ORDER — INSULIN GLARGINE 100 UNIT/ML ~~LOC~~ SOLN
34.0000 [IU] | Freq: Two times a day (BID) | SUBCUTANEOUS | Status: DC
  Administered 2020-01-29 – 2020-02-04 (×12): 34 [IU] via SUBCUTANEOUS
  Filled 2020-01-29 (×13): qty 0.34

## 2020-01-29 NOTE — Progress Notes (Signed)
Patient ID: [name] [name], female   DOB: 09/15/1932, 86 y.o.   MRN: [id number]  Met with pt and other daughter to discuss how well pt did in therapies today. MD injected her knee and it has a difference, she was standing in therapies today. Pt is going to push herself so she can make as much progress as possible before her discharge home. Daughter wants to get her siblings together and meet with this worker to discuss the plans-encouraged them to meet prior to to work out care of pt and how much each can do. Work on discharge needs, daughter to get back with worker regarding meeting.

## 2020-01-29 NOTE — Progress Notes (Signed)
Physical Therapy Session Note  Patient Details  Name: [name] [name] MRN: [id number] Date of Birth: [birth year]  Today's Date: 01/29/2020 PT Individual Time: 1100-1200 PT Individual Time Calculation (min): 60 min   Short Term Goals: Week 2:  PT Short Term Goal 1 (Week 2): Pt will complete bed mobility with no more than modA PT Short Term Goal 2 (Week 2): Pt will perform bed<>chair transfers with modA and LRAD PT Short Term Goal 3 (Week 2): Pt will ambulate 87ft with modA +2 with LRAD  Skilled Therapeutic Interventions/Progress Updates:    Pt received sitting in recliner, daughter at bedside, pt agreeable to PT session but requesting Voltaren cream for R knee and L hip pain. RN notified who arrived shortly for administration. Pt reports improved R knee pain from yesterday's injection, pt and daughter thankful. She performed stand<>pivot with maxA from recliner to w/c, daughter stabilizing w/c for safety. Cues for stepping pattern, safety approach, weight shifting. W/c transport for energy conservation from her room to main therapy gym. Performed sliding board transfer with mod/maxA from w/c to mat table. Able to sit EOM with supervision. Performed repeated sit<>stands from slightly raised mat table with min/modA and R knee block with no AD. Assist mostly required for rising from seated position. She was able to maintain standing for ~2-3 minutes with minA guard with R knee block. Performed standing cervical rotations L<>R and vertically up/down with min/modA guard and R knee block. RT present for later part of session to assist with both encouragement, motivation, and participation with therapy session. She performed NMR for RUE while sitting while performing gentle ball toss, encouraging RUE involvement for awareness and activation; requiring hand-over-hand assist for both gripping ball and tossing back. She also performed standing functional reaching with LUE with therapist providing min/modA guard  with R knee block, emphasizing midline reaching to promote RLE weight shift. She then completed forward/backward stepping with LLE with modA and R knee block, demonstrating improved L foot clearance with stepping and ability to accept more weight on RLE without buckling. Ended session performing side stepping L<>R along the mat table with mostly minA +2 (requiring modA when R knee buckled) and she did this very well! Therapist assisting with R foot placement, sequencing, and knee guarding for hyperextension vs buckling. Performed stand<>pivot with mod/maxA from mat table to w/c and she was returned to her room where she performed an additional stand<>pivot with maxA from w/c to recliner. She remained seated in recliner with needs in reach, safety belt alarm on, daughter at bedside. Pt very pleased with session and progress, reports this was "her best day of therapy ever."  Therapy Documentation Precautions:  Precautions Precautions: Fall Precaution Comments: R-hemi; watch for subluxation Restrictions Weight Bearing Restrictions: No  Therapy/Group: Individual Therapy  Kashawn Manzano P Sayan Aldava PT 01/29/2020, 7:29 AM

## 2020-01-29 NOTE — Progress Notes (Signed)
[facility] PHYSICAL MEDICINE & REHABILITATION PROGRESS NOTE   Subjective/Complaints: Patient seen sitting up in bed this morning.  She states she slept well overnight.  She is appreciative of her knee injection yesterday.  Right side seems to be improving.  Review of systems: Denies CP, SOB, N/V/D  Objective:   No results found. No results for input(s): WBC, HGB, HCT, PLT in the last 72 hours. No results for input(s): NA, K, CL, CO2, GLUCOSE, BUN, CREATININE, CALCIUM in the last 72 hours.  Intake/Output Summary (Last 24 hours) at 01/29/2020 0915 Last data filed at 01/29/2020 0854 Gross per 24 hour  Intake 462 ml  Output -  Net 462 ml        Physical Exam: Vital Signs Blood pressure (!) 144/44, pulse 72, temperature 98.3 F (36.8 C), temperature source Oral, resp. rate 16, height 5\' 3"  (1.6 m), weight 101.3 kg, SpO2 98 %. Constitutional: No distress . Vital signs reviewed. HENT: Normocephalic.  Atraumatic. Eyes: EOMI. No discharge. Cardiovascular: No JVD.  RRR. Respiratory: Normal effort.  No stridor.  Bilateral clear to auscultation. GI: Non-distended.  BS +. Skin: Warm and dry.  Intact. Psych: Normal mood.  Normal behavior. Musc: No edema in extremities.  No tenderness in extremities. Neuro: Alert Motor: RUE: 2+/5 proximal to distal with apraxia, improving RLE: 2+-3 -/5 proximal to distal with apraxia  Assessment/Plan: 1. Functional deficits which require 3+ hours per day of interdisciplinary therapy in a comprehensive inpatient rehab setting.  Physiatrist is providing close team supervision and 24 hour management of active medical problems listed below.  Physiatrist and rehab team continue to assess barriers to discharge/monitor patient progress toward functional and medical goals  Care Tool:  Bathing    Body parts bathed by patient: Chest, Abdomen, Right upper leg, Left upper leg, Face, Right arm   Body parts bathed by helper: Right arm, Chest, Abdomen, Right  upper leg, Left upper leg, Right lower leg, Left lower leg, Face, Left arm, Front perineal area Body parts n/a: Buttocks   Bathing assist Assist Level: Minimal Assistance - Patient > 75%     Upper Body Dressing/Undressing Upper body dressing   What is the patient wearing?: Pull over shirt    Upper body assist Assist Level: Set up assist    Lower Body Dressing/Undressing Lower body dressing      What is the patient wearing?: Incontinence brief, Pants     Lower body assist Assist for lower body dressing: Moderate Assistance - Patient 50 - 74%     Toileting Toileting Toileting Activity did not occur (Clothing management and hygiene only): N/A (no void or bm)  Toileting assist Assist for toileting: Moderate Assistance - Patient 50 - 74%     Transfers Chair/bed transfer  Transfers assist     Chair/bed transfer assist level: Maximal Assistance - Patient 25 - 49%     Locomotion Ambulation   Ambulation assist   Ambulation activity did not occur: Safety/medical concerns  Assist level: Maximal Assistance - Patient 25 - 49% Assistive device: Parallel bars Max distance: 8   Walk 10 feet activity   Assist  Walk 10 feet activity did not occur: Safety/medical concerns        Walk 50 feet activity   Assist Walk 50 feet with 2 turns activity did not occur: Safety/medical concerns         Walk 150 feet activity   Assist Walk 150 feet activity did not occur: Safety/medical concerns  Walk 10 feet on uneven surface  activity   Assist Walk 10 feet on uneven surfaces activity did not occur: Safety/medical concerns         Wheelchair     Assist Will patient use wheelchair at discharge?: Yes Type of Wheelchair: Manual    Wheelchair assist level: Maximal Assistance - Patient 25 - 49% Max wheelchair distance: 25    Wheelchair 50 feet with 2 turns activity    Assist        Assist Level: Total Assistance - Patient < 25%    Wheelchair 150 feet activity     Assist      Assist Level: Total Assistance - Patient < 25%   Blood pressure (!) 144/44, pulse 72, temperature 98.3 F (36.8 C), temperature source Oral, resp. rate 16, height 5\' 3"  (1.6 m), weight 101.3 kg, SpO2 98 %.  Medical Problem List and Plan: 1. 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  Continue CIR, discussed with therapies timing of therapies in coordination with patient schedule to optimize gains  WHO/PRAFO nightly 2. Antithrombotics: -DVT/anticoagulation:Pharmaceutical:Heparin -antiplatelet therapy: N/a 3. Pain Management:Tylenol as needed.   Voltaren gel ordered  Right knee intra-articular steroid injection (supposed to have knee replacement in 02/2020) performed on 12/2  Appears to be improving on 12/2 4. Mood:LCSW to follow for evaluation and support when appropriate. -antipsychotic agents: N/A  Seroquel prn HS 25mg  per son.   Nursing order for lavender drops on forehead at night.   Amantadine 50 started on 12/1 5. Neuropsych: This patientis notcapable of making decisions on herown behalf. 6. Skin/Wound Care:Routine pressure-relief measures. Maintain adequate nutrition and hydration status. 7. Fluids/Electrolytes/Nutrition:Monitor intake/output--  Will need assistance with feeding as due to fluctuating bouts of lethargy.  8. HTN: Monitor BP --continue Lasix, Hydralazine, Metoprolol  HCTZ changed to chlorthalidone on 11/25             Vitals:   01/28/20 2028 01/29/20 0604  BP: (!) 166/46 (!) 144/44  Pulse: 78 72  Resp: 18 16  Temp: 98.3 F (36.8 C) 98.3 F (36.8 C)  SpO2: 97% 98%   Labile on 12/2, monitor for trend, may need to increase chlorthalidone versus alternative medication  in conjunction with kidney function 9. Acute on chronic kidney injury: BUN/SCr 34/1.5 at admission.  Decreased lasix to 10mg   Creatinine 1.73 on 11/29, labs ordered for tomorrow  Encourage fluids 10. PNA?/Tracheobronchitis: Completed course of antibiotics on  11. Leukocytosis: Resolved   12. T2DM with hyperglycemia:  Monitor BS ac/hs.   CBG (last 3)  Recent Labs    01/28/20 1634 01/28/20 2100 01/29/20 0604  GLUCAP 275* 398* 349*   Lantus decreased to 48U, decreased to 35 twice daily on 11/23, decreased to 28 on 11/24, increased to 30 twice daily on 11/30, increase to 34 twice daily on 12/2  Elevated on 12/2, however steroid injection performed on 12/1 13. Post stroke dysphagia :             D3 thins, continue to advance diet as tolerated. 14.  Hyperkalemia  Potassium 3.7 on 11/29 15.  Slow transit constipation  Bowel meds increased on 11/28  Improving 16. Sleep disturbance  AM lethargy with Trazodone, d/ced  Improving 17.  Right knee OA  See #3  LOS: 13 days A FACE TO FACE EVALUATION WAS PERFORMED  [name] [name]  [name] [name] 01/29/2020, 9:15 AM

## 2020-01-29 NOTE — Progress Notes (Signed)
Speech Language Pathology Daily Session Note  Patient Details  Name: [name] [name] MRN: [id number] Date of Birth: 05/02/1932  Today's Date: 01/29/2020 SLP Individual Time: 0900-1000 SLP Individual Time Calculation (min): 60 min  Short Term Goals: Week 2: SLP Short Term Goal 1 (Week 2): Patient will consume trials of Dys 3 solids without overt coughing/throat clearing and with full clearance of oral cavity, with SLP only, with minA. SLP Short Term Goal 2 (Week 2): Patient will demonstrate recall and awareness to specific areas of progress (cognitive, swallow, physical) with minA cues. SLP Short Term Goal 3 (Week 2): Patient will perform mild complex functional tasks (money management, medication management, etc) with modA cues. SLP Short Term Goal 4 (Week 2): Patient will achieve 90% intelligibility of speech at conversational level with minA cues  Skilled Therapeutic Interventions:   Patient seen for skilled ST session to focus on cognitive-linguistic goals. Daughter present at beginning of session and she asked about progress. SLP informed her that today patient is significantly more alert and participatory than she had been in sessions last week. Patient's speech was more clear and voice was slightly stronger at conversational level without cues for approximately 85-90% intelligibility. She performed PEG task (she had been introduced to this yesterday) and completed with 100% accuracy and supervision to mod I cues. She then participated in medication management task with simulated pill box. SLP wrote down 5 of her medications she is currently taking and would likely continue upon discharge. Patient recognized two of the 5 as ones she had been previously taking. Patient continues to benefit from skilled SLP intervention to maximize cognitive-linguistic and swallow function goals prior to discharge.   Pain Pain Assessment Pain Scale: 0-10 Pain Score: 0-No pain  Therapy/Group: Individual  Therapy  Angela Nevin, MA, CCC-SLP Speech Therapy

## 2020-01-29 NOTE — Progress Notes (Signed)
Occupational Therapy Session Note  Patient Details  Name: [name] [name] MRN: [id number] Date of Birth: 31-Jul-1932  Today's Date: 01/29/2020 OT Individual Time: 1400-1515 OT Individual Time Calculation (min): 75 min    Short Term Goals: Week 2:  OT Short Term Goal 1 (Week 2): Pt will complete LB dressing using AE as needed with min assist OT Short Term Goal 2 (Week 2): Pt will complete sit to stand with min assist OT Short Term Goal 3 (Week 2): Pt will achive 45 degrees active right shoulder scaption to assist with self care. OT Short Term Goal 4 (Week 2): Pt will complete toileting with min assist.  Skilled Therapeutic Interventions/Progress Updates:    Pt sitting up in w/c, reporting frustration with recent transfer to the bathroom due to experiencing back pain on left side and having difficulty with transfer.  Pt reports nurse just administered pain medicine about 15 minutes ago.  Agreeable to OT session.  Pt completed sliding board transfer recliner to w/c with max assist. Heating pack applied to lower back to address pain and pt reports some relief with this measure.  Pt transported to outdoor porch at [facility] entrance to complete NMR RUE.  OT provided TCs, manual facilitation, and VCs to pts RUE to recruit musculature during isolated movements to promote motor learning and motor planning.  Pt completed shoulder scaption, FF, ER/IR, elbow flex/ext, forearm pro/sup, wrist ext/flex, digital ext/flex.  Increased active movement noted in all planes today, with increased pt alertness noted throughout session.  Pt also able to actively reach in forward plane to touch OTs hand achieving approximately 45 degrees shoulder flexion, however pt fatigues quickly after 2- 3 reps AROM against gravity.  Pt also encouraged to utilize RUE actively during functional tasks and refrain from passively lifting arm using LUE (pt observed doing this multiple occasions).   Pt returned to room and completed sit<>stand at  stedy with mod assist and transported to recliner.  Call bell in reach, seat belt alarm on.  Therapy Documentation Precautions:  Precautions Precautions: Fall Precaution Comments: R-hemi; watch for subluxation Restrictions Weight Bearing Restrictions: No   Therapy/Group: Individual Therapy  Amie Critchley 01/29/2020, 4:17 PM

## 2020-01-30 ENCOUNTER — Inpatient Hospital Stay (HOSPITAL_COMMUNITY): Payer: Medicare Other | Admitting: Occupational Therapy

## 2020-01-30 ENCOUNTER — Inpatient Hospital Stay (HOSPITAL_COMMUNITY): Payer: Medicare Other | Admitting: Speech Pathology

## 2020-01-30 ENCOUNTER — Inpatient Hospital Stay (HOSPITAL_COMMUNITY): Payer: Medicare Other

## 2020-01-30 LAB — GLUCOSE, CAPILLARY
Glucose-Capillary: 234 mg/dL — ABNORMAL HIGH (ref 70–99)
Glucose-Capillary: 224 mg/dL — ABNORMAL HIGH (ref 70–99)
Glucose-Capillary: 190 mg/dL — ABNORMAL HIGH (ref 70–99)
Glucose-Capillary: 215 mg/dL — ABNORMAL HIGH (ref 70–99)

## 2020-01-30 MED ORDER — LIDOCAINE 5 % EX PTCH
1.0000 | MEDICATED_PATCH | Freq: Every day | CUTANEOUS | Status: DC
  Administered 2020-01-30 – 2020-02-12 (×14): 1 via TRANSDERMAL
  Filled 2020-01-30 (×14): qty 1

## 2020-01-30 NOTE — Progress Notes (Signed)
Speech Language Pathology Weekly Progress and Session Note  Patient Details  Name: [name] [name] MRN: [id number] Date of Birth: 1933-02-21  Beginning of progress report period: 01/24/2020 End of progress report period: 01/30/2020  Today's Date: 01/30/2020 SLP Individual Time: 0930-1030 SLP Individual Time Calculation (min): 60 min  Short Term Goals: Week 2: SLP Short Term Goal 1 (Week 2): Patient will consume trials of Dys 3 solids without overt coughing/throat clearing and with full clearance of oral cavity, with SLP only, with minA. SLP Short Term Goal 1 - Progress (Week 2): Met SLP Short Term Goal 2 (Week 2): Patient will demonstrate recall and awareness to specific areas of progress (cognitive, swallow, physical) with minA cues. SLP Short Term Goal 2 - Progress (Week 2): Met SLP Short Term Goal 3 (Week 2): Patient will perform mild complex functional tasks (money management, medication management, etc) with modA cues. SLP Short Term Goal 3 - Progress (Week 2): Met SLP Short Term Goal 4 (Week 2): Patient will achieve 90% intelligibility of speech at conversational level with minA cues SLP Short Term Goal 4 - Progress (Week 2): Met    New Short Term Goals: Week 3: SLP Short Term Goal 1 (Week 3): Patient will tolerate trials of regular solid meal tray without overt s/s of oral difficulty and without coughing/throat clearing with supervisionA SLP Short Term Goal 2 (Week 3): Patient will perform mild complex functional problem solving tasks with minA cues for 90% accuracy. SLP Short Term Goal 3 (Week 3): Patient will maintain speech intelligiblity at 90% and vocal intensity at [facility] during conversation with supervisionA cues. SLP Short Term Goal 4 (Week 3): Patient will demonstrate anticipatory awareness by describing her current level of function/deficits and how this would likely impact her function at home, with minA.  Weekly Progress Updates:  Patient made very good progress and met  all 4 of her short term goals. She is currently tolerating a Dys 3, thin liquids diet with plans to trial upgrading to regular soon. She has demonstrated significant improvement in her cognitive functioning, speech intelligibility and voice.    Intensity: Minumum of 1-2 x/day, 30 to 90 minutes Frequency: 3 to 5 out of 7 days Duration/Length of Stay: 12/16 Treatment/Interventions: Cognitive remediation/compensation;Functional tasks;Internal/external aids;Cueing hierarchy;Patient/family education;Medication managment;Multimodal communication approach;Environmental controls;Dysphagia/aspiration precaution training;Speech/Language facilitation   Daily Session  Skilled Therapeutic Interventions: Patient seen with daughter present in room with session focus on cognitive-linguistic goals. Patient's daughter did request information regarding Dys 3 diet. Patient stated that she did not sleep well as her back was hurting. She did appear fatigued and with slower response time than previous date. Patient able to complete mild complex level task for finding calories corresponding to food items with SLP acting as scribe. She required min-mod A cues to complete task but did have some awareness, stating that she was "slower" today. Patient continues to benefit from skilled SLP intervention to maximize cognitive-linguistic, speech and swallow function prior to discharge.    General    Pain Pain Assessment Pain Scale: 0-10 Pain Score: 3  Faces Pain Scale: Hurts a little bit Pain Type: Acute pain Pain Location: Back Pain Orientation: Lower Pain Descriptors / Indicators: Aching Pain Onset: On-going Pain Intervention(s): Other (Comment) (RN already aware) PAINAD (Pain Assessment in Advanced Dementia) Breathing: normal Negative Vocalization: none Facial Expression: smiling or inexpressive Body Language: relaxed Consolability: no need to console PAINAD Score: 0  Therapy/Group: Individual Therapy  Sonia Baller, MA, CCC-SLP Speech Therapy

## 2020-01-30 NOTE — Progress Notes (Addendum)
Patient ID: [name] [name], female   DOB: [birth year], 86 y.o.   MRN: [id number] Son-[name] here today and wants to meet to answer his questions. When asked if have spoken with his sister he had not. Addressed with sister yesterday to have all four children meet together to discuss plan at discharge and hours can provide care and days. Will meet with eddie at 2:30 pm and address his questions and concerns.  107;35 PM Met with son, daughter in-law, daughter and son in-law to discuss options for discharge. Hired assist, short term NHP and ALF. They are looking at the long term plan. They plan to talk with pt and get her input. Will continue to work on discharge plans.

## 2020-01-30 NOTE — Progress Notes (Signed)
Pt tearful at beginning of the shift, but was unsure why. Pt slept well through out the night, c/o lower back pain; PRN tylenol effective. Pt resting in bed at this time. Call light in reach

## 2020-01-30 NOTE — Progress Notes (Signed)
Occupational Therapy Session Note  Patient Details  Name: [name] [name] MRN: [id number] Date of Birth: May 06, 1932  Today's Date: 01/30/2020 OT Individual Time: 4403-4742 OT Individual Time Calculation (min): 80 min    Short Term Goals: Week 2:  OT Short Term Goal 1 (Week 2): Pt will complete LB dressing using AE as needed with min assist OT Short Term Goal 2 (Week 2): Pt will complete sit to stand with min assist OT Short Term Goal 3 (Week 2): Pt will achive 45 degrees active right shoulder scaption to assist with self care. OT Short Term Goal 4 (Week 2): Pt will complete toileting with min assist.  Skilled Therapeutic Interventions/Progress Updates:    Pt sitting up in recliner, c/o moderate back pain, heating pad applied, reports she took tylenol a couple of hours ago.  Pt requesting to take a shower with OT this morning.  Pt completed stedy transfer with min assist sit<>stand and transported to shower bench at walk in shower.  Pt doffed shirt with supervision needing min VCs for problem solving.  Pt doffed shoes with step by step VCs for technique to "kick off".  Pt doffed brief and pants standing at stedy with min assist for transfer and pulling over hips. Pt able to doff over bilateral feet without assist seated at transfer bench.  Pt bathed UB and LB using long handled reacher primarily sitting at TTB and standing at stedy to bathe buttocks.  Pt required supervision for UB bathing and min assist to bathe LB including assist for sit<>stand transfer at stedy.  After drying off with assist, pt requesting urgent toileting.  Stedy transfer completed with min assist for sit<>stand from TTB to 3 in 1 commode over toilet.  Pt had continence of bowel.  Pt completed clothing mgt and pericare with mod assist.  Pt transported to w/c in front of sink using stedy with min assist for sit<>stand.  Pt donned shirt and bra needing min assist to clasp bra and pull shirt over right shoulder and also required  min VCs for hemitechnique carryover.  Pt donned brief and pants with mod assist sitting/standing at sink.  Total dependence to donn socks.  Pt transferred to recliner using stedy with min assist for sit<>stand.  Call bell in reach, seat belt alarm on, RUE positioned with pillows.  Provided visual handout in room to facilitate improved carryover of RUE resting positioning seated and at bed level.  Therapy Documentation Precautions:  Precautions Precautions: Fall Precaution Comments: R-hemi; watch for subluxation Restrictions Weight Bearing Restrictions: No   Therapy/Group: Individual Therapy  Amie Critchley 01/30/2020, 12:49 PM

## 2020-01-30 NOTE — Progress Notes (Signed)
Physical Therapy Weekly Progress Note  Patient Details  Name: [name] [name] MRN: [id number] Date of Birth: 19-May-1932  Beginning of progress report period: January 24, 2020 End of progress report period: January 30, 2020  Today's Date: 01/30/2020 PT Individual Time: 1430-1530 PT Individual Time Calculation (min): 60 min   Patient has met 1 of 3 short term goals. Pt is making slow progress towards her goals. She has been struggling with R knee and R lower back/hip pain that has inhibited some progress with functional mobility. These have been addressed with knee injection and kpad for heating element. We have also adjusted therapy schedules to try and focus therapy in the AM rather than PM so that she is more alert and attentive. She requires modA for bed mobility, maxA for sliding board, squat pivot, and stand pivot transfers. She has been able to initiate pre-gait training including forward/backward stepping (R knee block) and side stepping with modA +2. Have not been able to progress functional gait due to decreased standing balance, decreased endurance, R hemibody weakness, and decreased ability to compensate for deficits.   Patient continues to demonstrate the following deficits muscle weakness, decreased cardiorespiratoy endurance, impaired timing and sequencing, abnormal tone, unbalanced muscle activation, decreased coordination and decreased motor planning, decreased initiation, decreased attention, decreased awareness, decreased problem solving, decreased safety awareness, decreased memory and delayed processing and decreased sitting balance, decreased standing balance, decreased postural control, hemiplegia and decreased balance strategies and therefore will continue to benefit from skilled PT intervention to increase functional independence with mobility.  Patient progressing toward long term goals. Plan of care revisions: Goals downgraded - Please refer to LTG note for details..  PT  Short Term Goals Week 3:  PT Short Term Goal 1 (Week 3): Pt will complete bed mobility with no more than minA PT Short Term Goal 2 (Week 3): Pt will complete functional transfers, consistently, with modA and LRAD PT Short Term Goal 3 (Week 3): Pt will ambulate 43ft with maxA and LRAD  Skilled Therapeutic Interventions/Progress Updates:    Pt greeted sitting in recliner, son at bedside, pt agreeable to PT session. She reports mild R lower back/hip pain but reports that she just had recently received tylenol from nursing staff. She also had kpad heating element on her lower back too. Performed stand<>pivot with modA for rising and maxA for balance and facilitating lateral weight shift for stepping sequencing to w/c. She was transported in w/c with totalA for energy conservation to // bars in main therapy gym. Performed gait training with maxA in // bars with therapist on stool facilitating lateral weight shifts and RLE foot placement. She continues to show step-to gait pattern with forward flexed trunk while stepping with L foot. She has trouble with accepting weight on her RLE due to fearfulness of falling. She ambulated the length of the // bars twice with extended seated rest break due to fatigue and back pain. She then performed sliding board transfer with maxA from w/c to mat table with cues for forward weight shift, L hand and R foot placement. She performed repeated sit<>stands with min/modA and R knee block from mat table height, again limited by lower back pain. She also completed seated push-ups with blocks with hand-over-hand assist for RUE and cues for forward weight-shifting and lifting buttock from mat table. She completed sliding board transfer with modA from EOM to w/c and pt showed improved ability to clear hips rather than simply 'sliding" hips. Returned to her room in w/c  and performed stand pivot transfer with maxA from w/c to recliner with similar cues as outlined above. She ended session  with safety belt alarm on, RUE propped with pillow, and needs in reach. Pt reports moderate fatigue after completing this therapy session.  Therapy Documentation Precautions:  Precautions Precautions: Fall Precaution Comments: R-hemi; watch for subluxation Restrictions Weight Bearing Restrictions: No  Therapy/Group: Individual Therapy  Alger Simons PT, DPT 01/30/2020, 7:46 AM

## 2020-01-30 NOTE — Progress Notes (Signed)
[name] [name] PHYSICAL MEDICINE & REHABILITATION PROGRESS NOTE   Subjective/Complaints: Sleeping well Kpad helped low back pain, but still significant at times.  Had BM yesterday Knee pain improved after injection.   Review of systems: Denies CP, SOB, N/V/D, constipation  Objective:   No results found. No results for input(s): WBC, HGB, HCT, PLT in the last 72 hours. Recent Labs    01/29/20 1219  NA 135  K 4.8  CL 98  CO2 25  GLUCOSE 366*  BUN 28*  CREATININE 1.59*  CALCIUM 9.4    Intake/Output Summary (Last 24 hours) at 01/30/2020 1319 Last data filed at 01/29/2020 2025 Gross per 24 hour  Intake 240 ml  Output --  Net 240 ml        Physical Exam: Vital Signs Blood pressure (!) 143/47, pulse 69, temperature 98.2 F (36.8 C), temperature source Oral, resp. rate 20, height 5\' 3"  (1.6 m), weight 101.3 kg, SpO2 97 %. Gen: no distress, normal appearing HEENT: oral mucosa pink and moist, NCAT Cardio: Reg rate Chest: normal effort, normal rate of breathing Abd: soft, non-distended Ext: no edema Musc: No edema in extremities.  No tenderness in extremities. Neuro: Alert Motor: RUE: 2+/5 proximal to distal with apraxia, improving RLE: 2+-3 -/5 proximal to distal with apraxia  Assessment/Plan: 1. Functional deficits which require 3+ hours per day of interdisciplinary therapy in a comprehensive inpatient rehab setting.  Physiatrist is providing close team supervision and 24 hour management of active medical problems listed below.  Physiatrist and rehab team continue to assess barriers to discharge/monitor patient progress toward functional and medical goals  Care Tool:  Bathing    Body parts bathed by patient: Chest, Abdomen, Right upper leg, Left upper leg, Face, Right arm, Left arm, Right lower leg, Left lower leg, Front perineal area   Body parts bathed by helper: Right arm, Chest, Abdomen, Right upper leg, Left upper leg, Right lower leg, Left lower leg, Face,  Left arm, Front perineal area Body parts n/a: Buttocks   Bathing assist Assist Level: Minimal Assistance - Patient > 75% (sit<>stand at stedy)     Upper Body Dressing/Undressing Upper body dressing   What is the patient wearing?: Pull over shirt    Upper body assist Assist Level: Minimal Assistance - Patient > 75%    Lower Body Dressing/Undressing Lower body dressing      What is the patient wearing?: Incontinence brief, Pants     Lower body assist Assist for lower body dressing: Moderate Assistance - Patient 50 - 74%     Toileting Toileting Toileting Activity did not occur (Clothing management and hygiene only): N/A (no void or bm)  Toileting assist Assist for toileting: Moderate Assistance - Patient 50 - 74% (using stedy to stand for clothing mgt and pericare)     Transfers Chair/bed transfer  Transfers assist     Chair/bed transfer assist level: Maximal Assistance - Patient 25 - 49%     Locomotion Ambulation   Ambulation assist   Ambulation activity did not occur: Safety/medical concerns  Assist level: Maximal Assistance - Patient 25 - 49% Assistive device: Parallel bars Max distance: 8   Walk 10 feet activity   Assist  Walk 10 feet activity did not occur: Safety/medical concerns        Walk 50 feet activity   Assist Walk 50 feet with 2 turns activity did not occur: Safety/medical concerns         Walk 150 feet activity   Assist Walk  150 feet activity did not occur: Safety/medical concerns         Walk 10 feet on uneven surface  activity   Assist Walk 10 feet on uneven surfaces activity did not occur: Safety/medical concerns         Wheelchair     Assist Will patient use wheelchair at discharge?: Yes Type of Wheelchair: Manual    Wheelchair assist level: Maximal Assistance - Patient 25 - 49% Max wheelchair distance: 25    Wheelchair 50 feet with 2 turns activity    Assist        Assist Level: Total  Assistance - Patient < 25%   Wheelchair 150 feet activity     Assist      Assist Level: Total Assistance - Patient < 25%   Blood pressure (!) 143/47, pulse 69, temperature 98.2 F (36.8 C), temperature source Oral, resp. rate 20, height 5\' 3"  (1.6 m), weight 101.3 kg, SpO2 97 %.  Medical Problem List and Plan: 1. 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  Continue CIR, discussed with therapies timing of therapies in coordination with patient schedule to optimize gains  Tolerating therapy well.   WHO/PRAFO nightly 2. Antithrombotics: -DVT/anticoagulation:Pharmaceutical:Heparin -antiplatelet therapy: N/a 3. Pain Management:Tylenol as needed.   Voltaren gel ordered  Right knee intra-articular steroid injection (supposed to have knee replacement in 02/2020) performed on 12/2  Add kpad and lidocaine patch for lower back.  4. Mood:LCSW to follow for evaluation and support when appropriate. -antipsychotic agents: N/A  Seroquel prn HS 25mg  per son.   Nursing order for lavender drops on forehead at night.   Amantadine 50 started on 12/1 5. Neuropsych: This patientis notcapable of making decisions on herown behalf. 6. Skin/Wound Care:Routine pressure-relief measures. Maintain adequate nutrition and hydration status. 7. Fluids/Electrolytes/Nutrition:Monitor intake/output--  Will need assistance with feeding as due to fluctuating bouts of lethargy.  8. HTN: Monitor BP --continue Lasix, Hydralazine, Metoprolol  HCTZ changed to chlorthalidone on 11/25             Vitals:   01/29/20 2025 01/30/20 0429  BP: (!) 148/43 (!) 143/47  Pulse: 76 69  Resp: 16 20  Temp: 98.4 F (36.9 C) 98.2 F (36.8 C)  SpO2: 96% 97%   Labile 12/3- continue to  monitor.  9. Acute on chronic kidney injury: BUN/SCr 34/1.5 at admission.  Decreased lasix to 10mg   Creatinine 1.73 on 11/29, 1.59 on 12/2  Encourage fluids 10. PNA?/Tracheobronchitis: Completed course of antibiotics on  11. Leukocytosis: Resolved   12. T2DM with hyperglycemia:  Monitor BS ac/hs.   CBG (last 3)  Recent Labs    01/29/20 2111 01/30/20 0612 01/30/20 1201  GLUCAP 313* 234* 224*   Lantus decreased to 48U, decreased to 35 twice daily on 11/23, decreased to 28 on 11/24, increased to 30 twice daily on 11/30, increase to 34 twice daily on 12/2  Elevated on 12/2, however steroid injection performed on 12/1 13. Post stroke dysphagia :             D3 thins, continue to advance diet as tolerated. 14.  Hyperkalemia  Potassium 3.7 on 11/29 15.  Slow transit constipation  Bowel meds increased on 11/28  Improving 16. Sleep disturbance  AM lethargy with Trazodone, d/ced  Improving 17.  Right knee OA  See #3  LOS: 14  days A FACE TO FACE EVALUATION WAS PERFORMED  [name] [name] P [name] [name] 01/30/2020, 1:19 PM

## 2020-01-30 NOTE — Plan of Care (Signed)
  Problem: RH Balance Goal: LTG Patient will maintain dynamic standing balance (PT) Description: LTG:  Patient will maintain dynamic standing balance with assistance during mobility activities (PT) Flowsheets (Taken 01/30/2020 0753) LTG: Pt will maintain dynamic standing balance during mobility activities with:: Minimal Assistance - Patient > 75%   Problem: RH Bed Mobility Goal: LTG Patient will perform bed mobility with assist (PT) Description: LTG: Patient will perform bed mobility with assistance, with/without cues (PT). Flowsheets (Taken 01/30/2020 0753) LTG: Pt will perform bed mobility with assistance level of: Minimal Assistance - Patient > 75%   Problem: RH Bed to Chair Transfers Goal: LTG Patient will perform bed/chair transfers w/assist (PT) Description: LTG: Patient will perform bed to chair transfers with assistance (PT). 01/30/2020 0755 by Wynelle Link P, PT Note: Downgraded due to slow progress 01/30/2020 0753 by Orrin Brigham, PT Flowsheets (Taken 01/30/2020 0753) LTG: Pt will perform Bed to Chair Transfers with assistance level: Moderate Assistance - Patient 50 - 74%   Problem: RH Car Transfers Goal: LTG Patient will perform car transfers with assist (PT) Description: LTG: Patient will perform car transfers with assistance (PT). 01/30/2020 0755 by Wynelle Link P, PT Note: Downgraded due to slow progress 01/30/2020 0753 by Orrin Brigham, PT Flowsheets (Taken 01/30/2020 0753) LTG: Pt will perform car transfers with assist:: Moderate Assistance - Patient 50 - 74%   Problem: RH Furniture Transfers Goal: LTG Patient will perform furniture transfers w/assist (OT/PT) Description: LTG: Patient will perform furniture transfers  with assistance (OT/PT). 01/30/2020 0755 by Orrin Brigham, PT Note: Downgraded due to slow progress 01/30/2020 0753 by Orrin Brigham, PT Flowsheets (Taken 01/30/2020 [phone]) LTG: Pt will perform furniture transfers with  assist:: Moderate Assistance - Patient 50 - 74%   Problem: RH Ambulation Goal: LTG Patient will ambulate in controlled environment (PT) Description: LTG: Patient will ambulate in a controlled environment, # of feet with assistance (PT). 01/30/2020 0755 by Orrin Brigham, PT Note: Downgraded due to slow progress 01/30/2020 0753 by Orrin Brigham, PT Flowsheets (Taken 01/30/2020 0753) LTG: Pt will ambulate in controlled environ  assist needed:: Moderate Assistance - Patient 50 - 74% LTG: Ambulation distance in controlled environment: 33ft Goal: LTG Patient will ambulate in home environment (PT) Description: LTG: Patient will ambulate in home environment, # of feet with assistance (PT). 01/30/2020 0755 by Wynelle Link P, PT Note: Downgraded due to slow progress 01/30/2020 0753 by Orrin Brigham, PT Flowsheets (Taken 01/30/2020 0753) LTG: Pt will ambulate in home environ  assist needed:: Moderate Assistance - Patient 50 - 74% LTG: Ambulation distance in home environment: 77ft

## 2020-01-30 NOTE — Progress Notes (Signed)
Recreational Therapy Session Note  Patient Details  Name: GENISE STRACK MRN: [id number] Date of Birth: 1932-05-01 Today's Date: 01/30/2020 LATE ENTRY FOR 01/29/20 Pain: no c/o Skilled Therapeutic Interventions/Progress Updates: Session focused on activity tolerance, dynamic standing balance, leisure discussion including activity analysis and identification of modifications.  Pt very positive today and feeling as today is a great day in therapy.  Pt stood for ball toss activity using BUEs with hand over hand assist for RUE and min assist for balance/ R knee control.    Therapy/Group: Co-Treatment   Aquita Simmering 01/30/2020, 8:27 AM

## 2020-01-31 ENCOUNTER — Inpatient Hospital Stay (HOSPITAL_COMMUNITY): Payer: Medicare Other | Admitting: Physical Therapy

## 2020-01-31 LAB — GLUCOSE, CAPILLARY
Glucose-Capillary: 143 mg/dL — ABNORMAL HIGH (ref 70–99)
Glucose-Capillary: 197 mg/dL — ABNORMAL HIGH (ref 70–99)
Glucose-Capillary: 192 mg/dL — ABNORMAL HIGH (ref 70–99)
Glucose-Capillary: 198 mg/dL — ABNORMAL HIGH (ref 70–99)

## 2020-01-31 NOTE — Progress Notes (Signed)
[facility] PHYSICAL MEDICINE & REHABILITATION PROGRESS NOTE   Subjective/Complaints: Has ongoing back pain but tylenol and KPad keep it under control  ROS: Patient denies fever, rash, sore throat, blurred vision, nausea, vomiting, diarrhea, cough, shortness of breath or chest pain,  headache, or mood change.    Objective:   No results found. No results for input(s): WBC, HGB, HCT, PLT in the last 72 hours. Recent Labs    01/29/20 1219  NA 135  K 4.8  CL 98  CO2 25  GLUCOSE 366*  BUN 28*  CREATININE 1.59*  CALCIUM 9.4   No intake or output data in the 24 hours ending 01/31/20 1105      Physical Exam: Vital Signs Blood pressure (!) 147/43, pulse 68, temperature 98.1 F (36.7 C), temperature source Oral, resp. rate 18, height 5\' 3"  (1.6 m), weight 101.3 kg, SpO2 98 %. Constitutional: No distress . Vital signs reviewed. HEENT: EOMI, oral membranes moist Neck: supple Cardiovascular: RRR without murmur. No JVD    Respiratory/Chest: CTA Bilaterally without wheezes or rales. Normal effort    GI/Abdomen: BS +, non-tender, non-distended Ext: chronic vascular changes in legs/feet Psych: pleasant and cooperative Musc: No edema in extremities.  No tenderness in extremities. Neuro: Alert Motor: RUE: 2+/5 proximal to distal with apraxia, improving RLE: 2+-3 -/5 proximal to distal with apraxia LUE and LLE 4-5/5  Assessment/Plan: 1. Functional deficits which require 3+ hours per day of interdisciplinary therapy in a comprehensive inpatient rehab setting.  Physiatrist is providing close team supervision and 24 hour management of active medical problems listed below.  Physiatrist and rehab team continue to assess barriers to discharge/monitor patient progress toward functional and medical goals  Care Tool:  Bathing    Body parts bathed by patient: Chest, Abdomen, Right upper leg, Left upper leg, Face, Right arm, Left arm, Right lower leg, Left lower leg, Front perineal area    Body parts bathed by helper: Right arm, Chest, Abdomen, Right upper leg, Left upper leg, Right lower leg, Left lower leg, Face, Left arm, Front perineal area Body parts n/a: Buttocks   Bathing assist Assist Level: Minimal Assistance - Patient > 75% (sit<>stand at stedy)     Upper Body Dressing/Undressing Upper body dressing   What is the patient wearing?: Pull over shirt    Upper body assist Assist Level: Minimal Assistance - Patient > 75%    Lower Body Dressing/Undressing Lower body dressing      What is the patient wearing?: Incontinence brief, Pants     Lower body assist Assist for lower body dressing: Moderate Assistance - Patient 50 - 74%     Toileting Toileting Toileting Activity did not occur (Clothing management and hygiene only): N/A (no void or bm)  Toileting assist Assist for toileting: Moderate Assistance - Patient 50 - 74% (using stedy to stand for clothing mgt and pericare)     Transfers Chair/bed transfer  Transfers assist     Chair/bed transfer assist level: Maximal Assistance - Patient 25 - 49%     Locomotion Ambulation   Ambulation assist   Ambulation activity did not occur: Safety/medical concerns  Assist level: Maximal Assistance - Patient 25 - 49% Assistive device: Parallel bars Max distance: 8   Walk 10 feet activity   Assist  Walk 10 feet activity did not occur: Safety/medical concerns        Walk 50 feet activity   Assist Walk 50 feet with 2 turns activity did not occur: Safety/medical concerns  Walk 150 feet activity   Assist Walk 150 feet activity did not occur: Safety/medical concerns         Walk 10 feet on uneven surface  activity   Assist Walk 10 feet on uneven surfaces activity did not occur: Safety/medical concerns         Wheelchair     Assist Will patient use wheelchair at discharge?: Yes Type of Wheelchair: Manual    Wheelchair assist level: Maximal Assistance - Patient 25 -  49% Max wheelchair distance: 25    Wheelchair 50 feet with 2 turns activity    Assist        Assist Level: Total Assistance - Patient < 25%   Wheelchair 150 feet activity     Assist      Assist Level: Total Assistance - Patient < 25%   Blood pressure (!) 147/43, pulse 68, temperature 98.1 F (36.7 C), temperature source Oral, resp. rate 18, height 5\' 3"  (1.6 m), weight 101.3 kg, SpO2 98 %.  Medical Problem List and Plan: 1. 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  Continue CIR, discussed with therapies timing of therapies in coordination with patient schedule to optimize gains  Tolerating therapy well.   WHO/PRAFO nightly 2. Antithrombotics: -DVT/anticoagulation:Pharmaceutical:Heparin -antiplatelet therapy: N/a 3. Pain Management:Tylenol as needed.   Voltaren gel ordered  Right knee intra-articular steroid injection (supposed to have knee replacement in 02/2020) performed on 12/2  Added kpad and lidocaine patch for lower back with good results.  4. Mood:LCSW to follow for evaluation and support when appropriate. -antipsychotic agents: N/A  Seroquel prn HS 25mg  per son.   Nursing order for lavender drops on forehead at night.   Amantadine 50 started on 12/1 5. Neuropsych: This patientis notcapable of making decisions on herown behalf. 6. Skin/Wound Care:Routine pressure-relief measures. Maintain adequate nutrition and hydration status. 7. Fluids/Electrolytes/Nutrition:Monitor intake/output--  Will need assistance with feeding as due to fluctuating bouts of lethargy.  8. HTN: Monitor BP --continue Lasix, Hydralazine, Metoprolol  HCTZ changed to chlorthalidone on 11/25             Vitals:   01/30/20 1949 01/31/20 0432   BP: (!) 160/61 (!) 147/43  Pulse: (!) 54 68  Resp: 18 18  Temp: (!) 97.3 F (36.3 C) 98.1 F (36.7 C)  SpO2: 98% 98%   Labile 12/3- continue to monitor.  9. Acute on chronic kidney injury: BUN/SCr 34/1.5 at admission.  Decreased lasix to 10mg   Creatinine 1.73 on 11/29, 1.59 on 12/2  Encourage fluids 10. PNA?/Tracheobronchitis: Completed course of antibiotics on  11. Leukocytosis: Resolved   12. T2DM with hyperglycemia:  Monitor BS ac/hs.   CBG (last 3)  Recent Labs    01/30/20 1725 01/30/20 2104 01/31/20 0613  GLUCAP 190* 215* 143*   Lantus decreased to 48U, decreased to 35 twice daily on 11/23, decreased to 28 on 11/24, increased to 30 twice daily on 11/30, increase to 34 twice daily on 12/2  Elevated on 12/2, however steroid injection performed on 12/1   -12/4 looks like cbg's are continuing to fall since knee injection--observe today before making changes 13. Post stroke dysphagia :             D3 thins, continue to advance diet as tolerated. 14.  Hyperkalemia  Potassium 3.7 on 11/29 15.  Slow transit constipation  Bowel meds increased on 11/28  Improving 16. Sleep disturbance  AM lethargy with Trazodone, d/ced  Improved in general 17.  Right knee OA  See #3  LOS: 15 days A FACE TO FACE EVALUATION WAS PERFORMED  Ranelle Oyster 01/31/2020, 11:05 AM

## 2020-02-01 ENCOUNTER — Inpatient Hospital Stay (HOSPITAL_COMMUNITY): Payer: Medicare Other | Admitting: Speech Pathology

## 2020-02-01 LAB — GLUCOSE, CAPILLARY
Glucose-Capillary: 145 mg/dL — ABNORMAL HIGH (ref 70–99)
Glucose-Capillary: 198 mg/dL — ABNORMAL HIGH (ref 70–99)
Glucose-Capillary: 221 mg/dL — ABNORMAL HIGH (ref 70–99)
Glucose-Capillary: 280 mg/dL — ABNORMAL HIGH (ref 70–99)

## 2020-02-01 NOTE — Progress Notes (Signed)
[place] PHYSICAL MEDICINE & REHABILITATION PROGRESS NOTE   Subjective/Complaints: Back and right knee still bothersome but better  ROS: Patient denies fever, rash, sore throat, blurred vision, nausea, vomiting, diarrhea, cough, shortness of breath or chest pain,   headache, or mood change.     Objective:   No results found. No results for input(s): WBC, HGB, HCT, PLT in the last 72 hours. Recent Labs    01/29/20 1219  NA 135  K 4.8  CL 98  CO2 25  GLUCOSE 366*  BUN 28*  CREATININE 1.59*  CALCIUM 9.4    Intake/Output Summary (Last 24 hours) at 02/01/2020 1056 Last data filed at 02/01/2020 0900 Gross per 24 hour  Intake 1080 ml  Output --  Net 1080 ml        Physical Exam: Vital Signs Blood pressure (!) 156/46, pulse 77, temperature 98.6 F (37 C), temperature source Oral, resp. rate 18, height 5\' 3"  (1.6 m), weight 101.3 kg, SpO2 100 %. Constitutional: No distress . Vital signs reviewed. HEENT: EOMI, oral membranes moist Neck: supple Cardiovascular: RRR without murmur. No JVD    Respiratory/Chest: CTA Bilaterally without wheezes or rales. Normal effort    GI/Abdomen: BS +, non-tender, non-distended Ext: no clubbing, cyanosis, or edema Psych: pleasant and cooperative Musc: LB TTP. Right knee tender with PROM Neuro: Alert Motor: RUE: 2+/5 proximal to distal with apraxia, improving RLE: 2+ to 3 -/5 proximal to distal with apraxia LUE and LLE 4-5/5  Assessment/Plan: 1. Functional deficits which require 3+ hours per day of interdisciplinary therapy in a comprehensive inpatient rehab setting.  Physiatrist is providing close team supervision and 24 hour management of active medical problems listed below.  Physiatrist and rehab team continue to assess barriers to discharge/monitor patient progress toward functional and medical goals  Care Tool:  Bathing    Body parts bathed by patient: Chest, Abdomen, Right upper leg, Left upper leg, Face, Right arm, Left  arm, Right lower leg, Left lower leg, Front perineal area   Body parts bathed by helper: Right arm, Chest, Abdomen, Right upper leg, Left upper leg, Right lower leg, Left lower leg, Face, Left arm, Front perineal area Body parts n/a: Buttocks   Bathing assist Assist Level: Minimal Assistance - Patient > 75% (sit<>stand at stedy)     Upper Body Dressing/Undressing Upper body dressing   What is the patient wearing?: Pull over shirt    Upper body assist Assist Level: Minimal Assistance - Patient > 75%    Lower Body Dressing/Undressing Lower body dressing      What is the patient wearing?: Incontinence brief, Pants     Lower body assist Assist for lower body dressing: Moderate Assistance - Patient 50 - 74%     Toileting Toileting Toileting Activity did not occur (Clothing management and hygiene only): N/A (no void or bm)  Toileting assist Assist for toileting: Moderate Assistance - Patient 50 - 74% (using stedy to stand for clothing mgt and pericare)     Transfers Chair/bed transfer  Transfers assist     Chair/bed transfer assist level: Maximal Assistance - Patient 25 - 49%     Locomotion Ambulation   Ambulation assist   Ambulation activity did not occur: Safety/medical concerns  Assist level: Maximal Assistance - Patient 25 - 49% Assistive device: Parallel bars Max distance: 8   Walk 10 feet activity   Assist  Walk 10 feet activity did not occur: Safety/medical concerns        Walk 50 feet  activity   Assist Walk 50 feet with 2 turns activity did not occur: Safety/medical concerns         Walk 150 feet activity   Assist Walk 150 feet activity did not occur: Safety/medical concerns         Walk 10 feet on uneven surface  activity   Assist Walk 10 feet on uneven surfaces activity did not occur: Safety/medical concerns         Wheelchair     Assist Will patient use wheelchair at discharge?: Yes Type of Wheelchair: Manual     Wheelchair assist level: Maximal Assistance - Patient 25 - 49% Max wheelchair distance: 25    Wheelchair 50 feet with 2 turns activity    Assist        Assist Level: Total Assistance - Patient < 25%   Wheelchair 150 feet activity     Assist      Assist Level: Total Assistance - Patient < 25%   Blood pressure (!) 156/46, pulse 77, temperature 98.6 F (37 C), temperature source Oral, resp. rate 18, height 5\' 3"  (1.6 m), weight 101.3 kg, SpO2 100 %.  Medical Problem List and Plan: 1. 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  Continue CIR .   WHO/PRAFO nightly 2. Antithrombotics: -DVT/anticoagulation:Pharmaceutical:Heparin -antiplatelet therapy: N/a 3. Pain Management:Tylenol as needed.   Voltaren gel ordered  Right knee intra-articular steroid injection (supposed to have knee replacement in 02/2020) performed on 12/2  continue kpad and lidocaine patch for lower back with good results.  4. Mood:LCSW to follow for evaluation and support when appropriate. -antipsychotic agents: N/A  Seroquel prn HS 25mg  per son.   Nursing order for lavender drops on forehead at night.   Amantadine 50 started on 12/1 5. Neuropsych: This patientis notcapable of making decisions on herown behalf. 6. Skin/Wound Care:Routine pressure-relief measures. Maintain adequate nutrition and hydration status. 7. Fluids/Electrolytes/Nutrition:Monitor intake/output--  Will need assistance with feeding as due to fluctuating bouts of lethargy.  8. HTN: Monitor BP --continue Lasix, Hydralazine, Metoprolol  HCTZ changed to chlorthalidone on 11/25             Vitals:   01/31/20 2131 02/01/20 0446  BP: (!) 138/38 (!) 156/46  Pulse: 62 77  Resp:  18  Temp:   98.6 F (37 C)  SpO2:  100%   Labile 12/3- continue to monitor.  9. Acute on chronic kidney injury: BUN/SCr 34/1.5 at admission.  Decreased lasix to 10mg   Creatinine 1.73 on 11/29, 1.59 on 12/2  Encourage fluids 10. PNA?/Tracheobronchitis: Completed course of antibiotics on  11. Leukocytosis: Resolved   12. T2DM with hyperglycemia:  Monitor BS ac/hs.   CBG (last 3)  Recent Labs    01/31/20 1702 01/31/20 2121 02/01/20 0607  GLUCAP 192* 198* 145*   Lantus decreased to 48U, decreased to 35 twice daily on 11/23, decreased to 28 on 11/24, increased to 30 twice daily on 11/30, increase to 34 twice daily on 12/2  Elevated on 12/2, however steroid injection performed on 12/1  12/5   cbg's are continuing to fall since knee injection--no regimen changes today 13. Post stroke dysphagia :             D3 thins, continue to advance diet as tolerated. 14.  Hyperkalemia  Potassium 3.7 on 11/29 15.  Slow transit constipation  Bowel meds increased on 11/28  Improving 16. Sleep disturbance  AM lethargy with Trazodone, d/ced  Improved in general 17.  Right knee OA  See #3  LOS: 16 days A FACE TO FACE EVALUATION WAS PERFORMED  Ranelle Oyster 02/01/2020, 10:56 AM

## 2020-02-01 NOTE — Plan of Care (Signed)
  Problem: RH BOWEL ELIMINATION Goal: RH STG MANAGE BOWEL WITH ASSISTANCE Description: STG Manage Bowel with min Assistance. Outcome: Not Progressing; constipation LBM 12/3; daily laxatives given

## 2020-02-01 NOTE — Progress Notes (Signed)
Speech Language Pathology Daily Session Note  Patient Details  Name: [name] [name] MRN: [id number] Date of Birth: [birth year]  Today's Date: 02/01/2020 SLP Individual Time: 1350-1415 SLP Individual Time Calculation (min): 25 min  Short Term Goals: Week 3: SLP Short Term Goal 1 (Week 3): Patient will tolerate trials of regular solid meal tray without overt s/s of oral difficulty and without coughing/throat clearing with supervisionA SLP Short Term Goal 2 (Week 3): Patient will perform mild complex functional problem solving tasks with minA cues for 90% accuracy. SLP Short Term Goal 3 (Week 3): Patient will maintain speech intelligiblity at 90% and vocal intensity at [facility] during conversation with supervisionA cues. SLP Short Term Goal 4 (Week 3): Patient will demonstrate anticipatory awareness by describing her current level of function/deficits and how this would likely impact her function at home, with minA.  Skilled Therapeutic Interventions:  Pt was seen for skilled ST targeting dysphagia goals.  SLP facilitated the session with a trial snack of regular textures and thin liquids to continue working towards diet progression.  Pt consumed advanced solids with slightly prolonged but functional mastication and no overt s/s of aspiration with solids or liquids.  Pt appears to making good progress but will defer decisions regarding diet advancement to pt's primary SLP.  Pt was left in recliner with chair alarm set and call bell within reach.  Continue per current plan of care.    Pain Pain Assessment Pain Scale: 0-10 Pain Score: 0-No pain  Therapy/Group: Individual Therapy  Octavius Shin, Melanee Spry 02/01/2020, 4:24 PM

## 2020-02-02 ENCOUNTER — Inpatient Hospital Stay (HOSPITAL_COMMUNITY): Payer: Medicare Other | Admitting: Occupational Therapy

## 2020-02-02 ENCOUNTER — Inpatient Hospital Stay (HOSPITAL_COMMUNITY): Payer: Medicare Other

## 2020-02-02 DIAGNOSIS — M1711 Unilateral primary osteoarthritis, right knee: Secondary | ICD-10-CM

## 2020-02-02 DIAGNOSIS — G479 Sleep disorder, unspecified: Secondary | ICD-10-CM

## 2020-02-02 DIAGNOSIS — D696 Thrombocytopenia, unspecified: Secondary | ICD-10-CM

## 2020-02-02 LAB — CBC
HCT: 39.9 % (ref 36.0–46.0)
Hemoglobin: 13.6 g/dL (ref 12.0–15.0)
MCH: 31.3 pg (ref 26.0–34.0)
MCHC: 34.1 g/dL (ref 30.0–36.0)
MCV: 91.9 fL (ref 80.0–100.0)
Platelets: 149 10*3/uL — ABNORMAL LOW (ref 150–400)
RBC: 4.34 MIL/uL (ref 3.87–5.11)
RDW: 15.1 % (ref 11.5–15.5)
WBC: 5.3 10*3/uL (ref 4.0–10.5)
nRBC: 0 % (ref 0.0–0.2)

## 2020-02-02 LAB — GLUCOSE, CAPILLARY
Glucose-Capillary: 163 mg/dL — ABNORMAL HIGH (ref 70–99)
Glucose-Capillary: 190 mg/dL — ABNORMAL HIGH (ref 70–99)
Glucose-Capillary: 235 mg/dL — ABNORMAL HIGH (ref 70–99)
Glucose-Capillary: 279 mg/dL — ABNORMAL HIGH (ref 70–99)

## 2020-02-02 LAB — BASIC METABOLIC PANEL
Anion gap: 10 (ref 5–15)
BUN: 28 mg/dL — ABNORMAL HIGH (ref 8–23)
CO2: 27 mmol/L (ref 22–32)
Calcium: 9.4 mg/dL (ref 8.9–10.3)
Chloride: 101 mmol/L (ref 98–111)
Creatinine, Ser: 1.45 mg/dL — ABNORMAL HIGH (ref 0.44–1.00)
GFR, Estimated: 35 mL/min — ABNORMAL LOW (ref >60)
Glucose, Bld: 154 mg/dL — ABNORMAL HIGH (ref 70–99)
Potassium: 4 mmol/L (ref 3.5–5.1)
Sodium: 138 mmol/L (ref 135–145)

## 2020-02-02 MED ORDER — CHLORTHALIDONE 25 MG PO TABS
12.5000 mg | ORAL_TABLET | Freq: Once | ORAL | Status: AC
  Administered 2020-02-02: 12.5 mg via ORAL
  Filled 2020-02-02: qty 1

## 2020-02-02 MED ORDER — CHLORTHALIDONE 25 MG PO TABS
37.5000 mg | ORAL_TABLET | Freq: Every day | ORAL | Status: DC
  Administered 2020-02-03 – 2020-02-12 (×10): 37.5 mg via ORAL
  Filled 2020-02-02 (×10): qty 2

## 2020-02-02 NOTE — Progress Notes (Signed)
[place] PHYSICAL MEDICINE & REHABILITATION PROGRESS NOTE   Subjective/Complaints: Patient seen sitting up in bed this morning.  She states he did not sleep well overnight because she was restless.  She notes improvement in knee pain.  ROS: Denies CP, SOB, N/V/D  Objective:   No results found. Recent Labs    02/02/20 0742  WBC 5.3  HGB 13.6  HCT 39.9  PLT 149*   Recent Labs    02/02/20 0643  NA 138  K 4.0  CL 101  CO2 27  GLUCOSE 154*  BUN 28*  CREATININE 1.45*  CALCIUM 9.4    Intake/Output Summary (Last 24 hours) at 02/02/2020 1118 Last data filed at 02/02/2020 0855 Gross per 24 hour  Intake 775 ml  Output --  Net 775 ml        Physical Exam: Vital Signs Blood pressure (!) 156/45, pulse 77, temperature 98.1 F (36.7 C), resp. rate 20, height 5\' 3"  (1.6 m), weight 101.3 kg, SpO2 97 %. Constitutional: No distress . Vital signs reviewed. HENT: Normocephalic.  Atraumatic. Eyes: EOMI. No discharge. Cardiovascular: No JVD.  RRR. Respiratory: Normal effort.  No stridor.  Bilateral clear to auscultation. GI: Non-distended.  BS +. Skin: Warm and dry.  Intact. Psych: Normal mood.  Normal behavior. Musc: No edema in extremities.  No tenderness in extremities. Neuro: Alert Motor: RUE: 2+/5 proximal to distal with apraxia, improving RLE: 2+ to 3 -/5 proximal to distal with apraxia, improving LUE and LLE 4-5/5  Assessment/Plan: 1. Functional deficits which require 3+ hours per day of interdisciplinary therapy in a comprehensive inpatient rehab setting.  Physiatrist is providing close team supervision and 24 hour management of active medical problems listed below.  Physiatrist and rehab team continue to assess barriers to discharge/monitor patient progress toward functional and medical goals  Care Tool:  Bathing    Body parts bathed by patient: Chest, Abdomen, Right upper leg, Left upper leg, Face, Right arm, Left arm, Right lower leg, Left lower leg, Front  perineal area   Body parts bathed by helper: Right arm, Chest, Abdomen, Right upper leg, Left upper leg, Right lower leg, Left lower leg, Face, Left arm, Front perineal area Body parts n/a: Buttocks   Bathing assist Assist Level: Minimal Assistance - Patient > 75% (sit<>stand at stedy)     Upper Body Dressing/Undressing Upper body dressing   What is the patient wearing?: Pull over shirt    Upper body assist Assist Level: Minimal Assistance - Patient > 75%    Lower Body Dressing/Undressing Lower body dressing      What is the patient wearing?: Incontinence brief, Pants     Lower body assist Assist for lower body dressing: Moderate Assistance - Patient 50 - 74%     Toileting Toileting Toileting Activity did not occur (Clothing management and hygiene only): N/A (no void or bm)  Toileting assist Assist for toileting: Moderate Assistance - Patient 50 - 74% (using stedy to stand for clothing mgt and pericare)     Transfers Chair/bed transfer  Transfers assist     Chair/bed transfer assist level: Maximal Assistance - Patient 25 - 49%     Locomotion Ambulation   Ambulation assist   Ambulation activity did not occur: Safety/medical concerns  Assist level: Maximal Assistance - Patient 25 - 49% Assistive device: Parallel bars Max distance: 8   Walk 10 feet activity   Assist  Walk 10 feet activity did not occur: Safety/medical concerns  Walk 50 feet activity   Assist Walk 50 feet with 2 turns activity did not occur: Safety/medical concerns         Walk 150 feet activity   Assist Walk 150 feet activity did not occur: Safety/medical concerns         Walk 10 feet on uneven surface  activity   Assist Walk 10 feet on uneven surfaces activity did not occur: Safety/medical concerns         Wheelchair     Assist Will patient use wheelchair at discharge?: Yes Type of Wheelchair: Manual    Wheelchair assist level: Maximal Assistance -  Patient 25 - 49% Max wheelchair distance: 25    Wheelchair 50 feet with 2 turns activity    Assist        Assist Level: Total Assistance - Patient < 25%   Wheelchair 150 feet activity     Assist      Assist Level: Total Assistance - Patient < 25%   Blood pressure (!) 156/45, pulse 77, temperature 98.1 F (36.7 C), resp. rate 20, height 5\' 3"  (1.6 m), weight 101.3 kg, SpO2 97 %.  Medical Problem List and Plan: 1. 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  Continue CIR .   WHO/PRAFO nightly 2. Antithrombotics: -DVT/anticoagulation:Pharmaceutical:Heparin -antiplatelet therapy: N/a 3. Pain Management:Tylenol as needed.   Continue Voltaren gel  Right knee intra-articular steroid injection (supposed to have knee replacement in 02/2020) performed on 12/2  continue kpad and lidocaine patch for lower back with good results.   Controlled with meds on 12/6 4. Mood:LCSW to follow for evaluation and support when appropriate. -antipsychotic agents: N/A  Seroquel prn HS 25mg  per son.   Nursing order for lavender drops on forehead at night.   Amantadine 50 started on 12/1 with improvement 5. Neuropsych: This patientis notcapable of making decisions on herown behalf. 6. Skin/Wound Care:Routine pressure-relief measures. Maintain adequate nutrition and hydration status. 7. Fluids/Electrolytes/Nutrition:Monitor intake/output--  Will need assistance with feeding as due to fluctuating bouts of lethargy.  8. HTN: Monitor BP --continue Lasix, Hydralazine, Metoprolol  HCTZ changed to chlorthalidone on 11/25 , increased on 12/6            Vitals:   02/01/20 2153 02/02/20 0410  BP: (!) 138/39 (!) 156/45  Pulse: 66 77  Resp:  20  Temp:  98.1 F  (36.7 C)  SpO2:  97%   Remains elevated on 12/6 9. Acute on chronic kidney injury: BUN/SCr 34/1.5 at admission.  Decreased lasix to 10mg   Creatinine 1.45 on 12/6  Encourage fluids 10. PNA?/Tracheobronchitis: Completed course of antibiotics on  11. Leukocytosis: Resolved   12. T2DM with hyperglycemia:  Monitor BS ac/hs.   CBG (last 3)  Recent Labs    02/01/20 1652 02/01/20 2106 02/02/20 0612  GLUCAP 221* 280* 163*   Lantus decreased to 48U, decreased to 35 twice daily on 11/23, decreased to 28 on 11/24, increased to 30 twice daily on 11/30, increase to 34 twice daily on 12/2  Elevated on 12/2, however steroid injection performed on 12/1  Labile on 12/6 13. Post stroke dysphagia :             D3 thins, continue to advance diet as tolerated. 14.  Hyperkalemia  Potassium 4.0 on 12/6 15.  Slow transit constipation  Bowel meds increased on  11/28  Improving 16. Sleep disturbance  AM lethargy with Trazodone, d/ced  Overall improved 17.  Right knee OA  See #3 18.  Thrombocytopenia  Platelets 149 on 12/6  Continue to monitor   LOS: 17 days A FACE TO FACE EVALUATION WAS PERFORMED  Mcgregor Tinnon Karis Juba 02/02/2020, 11:18 AM

## 2020-02-02 NOTE — Progress Notes (Signed)
Physical Therapy Session Note  Patient Details  Name: [name] [name] MRN: [id number] Date of Birth: 11-Apr-1932  Today's Date: 02/02/2020 PT Individual Time: 1400-1455 PT Individual Time Calculation (min): 55 min   Short Term Goals: Week 3:  PT Short Term Goal 1 (Week 3): Pt will complete bed mobility with no more than minA PT Short Term Goal 2 (Week 3): Pt will complete functional transfers, consistently, with modA and LRAD PT Short Term Goal 3 (Week 3): Pt will ambulate 36ft with maxA and LRAD  Skilled Therapeutic Interventions/Progress Updates:    Pt greeted seated in recliner, son at bedside, pt agreeable to PT session. No reports of pain. Son asking questions regarding current mobility status and plans of session. Pt performed sit>stand with modA from recliner to RW and required modA for balance with stand<>pivot from recliner to w/c, requiriing modA for controlled lowering to w/c level. W/c transport for time management from her room to main therapy gym. She ambulated ~11ft with heavy modA and RW with w/c follow for safety. Therapist on stool on her R side facilitating R foot placement and blocking R knee to prevent buckling. She also required assist for RW advancement and reducing her forward flexed trunk. Stand<>pivot with modA and RW from w/c to mat table, similar technique as listed above. She performed repeated sit<>stands from lowered mat table height, requiring min/modA for rising and cues for hand placement and keeping her R leg underneath her as she tends to place it too far anteriorly. She then performed pre-gait training, including unilateral forward/backward stepping with RLE and with LLE and RW support and modA for balance. Therapist assisting with R foot placement and blocking R knee. She then performed side stepping L<>R along edge of mat table with modA for balance and RW support, difficulty clearing R foot, especially while stepping R. Performed stand<>pivot with maxA (likely due  to fatigue from session) and RW from mat table back to her w/c. Pt tearful at end of session regarding life changes due to her stroke and is hopeful that she will return to her independence; words of encouragement provided and redirection needed throughout session. She was returned to her room and performed stand<>pivot with maxA and no AD from w/c to recliner where she was therapeutically positioned with safety belt alarm on and needs in reach.  Therapy Documentation Precautions:  Precautions Precautions: Fall Precaution Comments: R-hemi; watch for subluxation Restrictions Weight Bearing Restrictions: No  Therapy/Group: Individual Therapy  Arantza Darrington P Asier Desroches PT 02/02/2020, 7:49 AM

## 2020-02-02 NOTE — Progress Notes (Signed)
Speech Language Pathology Daily Session Note  Patient Details  Name: [name] [name] MRN: [id number] Date of Birth: 06/16/1932  Today's Date: 02/02/2020 SLP Individual Time: 0902-1000 SLP Individual Time Calculation (min): 58 min  Short Term Goals: Week 3: SLP Short Term Goal 1 (Week 3): Patient will tolerate trials of regular solid meal tray without overt s/s of oral difficulty and without coughing/throat clearing with supervisionA SLP Short Term Goal 2 (Week 3): Patient will perform mild complex functional problem solving tasks with minA cues for 90% accuracy. SLP Short Term Goal 3 (Week 3): Patient will maintain speech intelligiblity at 90% and vocal intensity at [facility] during conversation with supervisionA cues. SLP Short Term Goal 4 (Week 3): Patient will demonstrate anticipatory awareness by describing her current level of function/deficits and how this would likely impact her function at home, with minA.  Skilled Therapeutic Interventions:Skilled ST services focused on cognitive skills. SLP assisted pt in selecting items for regular lunch tray trial in tomorrow session. SLP facilitated mildly complex problem solving and error awareness skills in TIB pill organizer task, pt made x4 errors most self-corrected with only supervision A verbal cues for errors. Pt required mod I to recall medication name. Function and times per day with written aid and min A fade to supervision A verbal cues to recall "after lunch medication" that was 3x a day. Pt was left in room with call bell within reach and chair alarm set. SLP recommends to continue skilled services.      Pain Pain Assessment Pain Score: 0-No pain  Therapy/Group: Individual Therapy  Jahan Friedlander  [facility] 02/02/2020, 12:19 PM

## 2020-02-02 NOTE — Progress Notes (Signed)
Occupational Therapy Weekly Progress Note  Patient Details  Name: [name] [name] MRN: [id number] Date of Birth: May 22, 1932  Beginning of progress report period: January 26, 2020 End of progress report period: February 02, 2020  Today's Date: 02/02/2020 OT Individual Time: 5277-8242 OT Individual Time Calculation (min): 77 min    Patient has met 1 of 4 short term goals and progressing towards remainder 3 goals. Pt initially progressing slowly due to low back pain, left knee pain, and lethargy, however with pain management measures and scheduling adjustments to allow for more therapy in AM, pt has noted with increased rate of progress over the past few sessions.  Pt has increased independence with self care including UB dressing from needing max assist to now needing minA/supervision, UB bathing from needing mod assist to now needing supervision using long handled sponge, LB bathing from needing max assist to now needing min assist, and LB dressing from needing max assist to now needing modA/minA using reacher.  Pt has also shown improvements in RUE strength and functional use going from not using RUE at all functionally to now able to use as a gross assist for certain self care tasks with minimal assist.  Pt also demonstrated improved sit<>stand today requiring mod/min assist at RW, whereas prior to this she required a stedy transfer.  Pt is motivated to keep progressing and requires skilled OT to further improve safety and independence and reduce caregiver burden during functional mobility and self care.  Patient continues to demonstrate the following deficits: muscle weakness, decreased cardiorespiratoy endurance, abnormal tone, decreased coordination and decreased motor planning, decreased attention, decreased problem solving and delayed processing and decreased sitting balance, decreased standing balance and decreased balance strategies and therefore will continue to benefit from skilled OT  intervention to enhance overall performance with BADL.  Patient progressing toward long term goals..  Continue plan of care.  OT Short Term Goals Week 2:  OT Short Term Goal 1 (Week 2): Pt will complete LB dressing using AE as needed with min assist OT Short Term Goal 1 - Progress (Week 2): Progressing toward goal OT Short Term Goal 2 (Week 2): Pt will complete sit to stand with min assist OT Short Term Goal 2 - Progress (Week 2): Progressing toward goal OT Short Term Goal 3 (Week 2): Pt will achive 45 degrees active right shoulder scaption to assist with self care. OT Short Term Goal 3 - Progress (Week 2): Met OT Short Term Goal 4 (Week 2): Pt will complete toileting with min assist. OT Short Term Goal 4 - Progress (Week 2): Progressing toward goal Week 3:  OT Short Term Goal 1 (Week 3): Pt will complete LB dressing using AE as needed with min assist OT Short Term Goal 2 (Week 3): Pt will complete sit to stand with min assist OT Short Term Goal 3 (Week 3): Pt will complete toileting with min assist. OT Short Term Goal 4 (Week 3): Pt will complete toilet transfer with mod assist  Skilled Therapeutic Interventions/Progress Updates:    Pt sitting up in recliner, no c/o pain, reporting concerns about dc planning including possibility of going to SNF for next level of care.  Per son, who was in room at beginning of session, he and his sibilings are considering SNF for dc if pt is unable to achieve supervision level of care at end of inpatient rehab stay.  OT provided emotional support and therapeutic use of self to reduce pt anxiety and facilitate increased participation during  OT session.  Pt agreeable to washing and dressing at sink today.  Pt washed face using bilateral hands needing hand over hand to RUE for completion.  Pt doffed shirt with supervision.  Pt washed UB with supervision.  Pt completed sit<>stand with min assist at sink to pull pants and brief down over hips with min assist.  Pt  doffed pants and brief over feet with supervision in sitting.  Pt bathed LB with supervision using RUE to wash top of RLE with min hand over hand to facilitate neuro re-ed.  OT provided visual demonstration of reacher use to donn brief.  Pt return demonstrated with mod assist.  Pt donned pants with mod assist (min assist for sit<>stand to pull over hips) without AE per pt request.    Pt donned shoes with mod assist for right foot and min assist for left foot using reacher as needed.  Pt completed blocked practice sit<>stand at RW using right walker hand splint and requiring mod assist for initial power up then min assist and increased time to come to full stance.  Pt maintained static standing x appox 1 minute with min assist at RW.  Stand to sit with mod assist to slow descent.  Call bell in reach, seat alarm on.  Therapy Documentation Precautions:  Precautions Precautions: Fall Precaution Comments: R-hemi; watch for subluxation Restrictions Weight Bearing Restrictions: No   Therapy/Group: Individual Therapy  Ezekiel Slocumb 02/02/2020, 12:54 PM

## 2020-02-03 ENCOUNTER — Inpatient Hospital Stay (HOSPITAL_COMMUNITY): Payer: Medicare Other

## 2020-02-03 ENCOUNTER — Inpatient Hospital Stay (HOSPITAL_COMMUNITY): Payer: Medicare Other | Admitting: Occupational Therapy

## 2020-02-03 DIAGNOSIS — I10 Essential (primary) hypertension: Secondary | ICD-10-CM

## 2020-02-03 LAB — GLUCOSE, CAPILLARY
Glucose-Capillary: 186 mg/dL — ABNORMAL HIGH (ref 70–99)
Glucose-Capillary: 185 mg/dL — ABNORMAL HIGH (ref 70–99)
Glucose-Capillary: 165 mg/dL — ABNORMAL HIGH (ref 70–99)
Glucose-Capillary: 286 mg/dL — ABNORMAL HIGH (ref 70–99)

## 2020-02-03 NOTE — Progress Notes (Signed)
Physical Therapy Session Note  Patient Details  Name: [name] [name] MRN: [id number] Date of Birth: [birth year]  Today's Date: 02/03/2020 PT Individual Time: 1345-1456 PT Individual Time Calculation (min): 71 min   Short Term Goals: Week 3:  PT Short Term Goal 1 (Week 3): Pt will complete bed mobility with no more than minA PT Short Term Goal 2 (Week 3): Pt will complete functional transfers, consistently, with modA and LRAD PT Short Term Goal 3 (Week 3): Pt will ambulate 14ft with maxA and LRAD  Skilled Therapeutic Interventions/Progress Updates:    Pt greeted seated in recliner, awake and agreeable to PT session. No reports of pain. Sit>stand with modA from recliner and stand<>pivot with mod/maxA for steadying, R knee blocking, and R foot placement. Transported in w/c for time management to main therapy gym. Performed squat<>pivot transfer with mod/maxA from w/c to mat table with cues for lifting hips, hip translation, and forward trunk lean. She requires reminders for "setting feet" (especially R foot) as she tends to leave it too far anteriorly. Performed repeated sit<>stands from mat table with minA and to RW, she rely's quite heavily on the back of her legs to assist with producing power/balance for transfer. Had her scoot forwards at edge of mat to reduce relying on her legs and she required modA for completing the same sit<>stand. She then completed pre-gait training including lateral stepping with modA and RW support and forward/backward stepping with modA and RW support. She is able to place her R hand into the RW splint but requires extra time. She performed NMR for RLE including standing toe taps on 1inch block with RW support and lateral stepping onto 1inch block with RW support. Required target for lateral stepping to promote glut activation vs hip flexion. Completed the session by having her pick up a cone from standing position with LUE support and RW and therapist providing modA  guard. She had difficulty grasping with RUE to the cone and was successful 2/5 efforts. While picking up object, she tend's to show excessive forward flexed trunk rather than squatting but this is likely due to RLE weakness and her fear of falling. Performed squat<>pivot with modA from mat table to w/c and she was returned to her room. Performed stand<>pivot with maxA from w/c to recliner and she remained seated in recliner with BLE elevated and RUE supported with pillow. Safety belt alarm on and needs in reach. RN present for medications.  Therapy Documentation Precautions:  Precautions Precautions: Fall Precaution Comments: R-hemi; watch for subluxation Restrictions Weight Bearing Restrictions: No  Therapy/Group: Individual Therapy  Orpheus Hayhurst P Dawana Asper PT 02/03/2020, 7:59 AM

## 2020-02-03 NOTE — Progress Notes (Signed)
Speech Language Pathology Daily Session Note  Patient Details  Name: [name] [name] MRN: [id number] Date of Birth: [birth year]  Today's Date: 02/03/2020 SLP Individual Time: 1202-1300 SLP Individual Time Calculation (min): 58 min  Short Term Goals: Week 3: SLP Short Term Goal 1 (Week 3): Patient will tolerate trials of regular solid meal tray without overt s/s of oral difficulty and without coughing/throat clearing with supervisionA SLP Short Term Goal 2 (Week 3): Patient will perform mild complex functional problem solving tasks with minA cues for 90% accuracy. SLP Short Term Goal 3 (Week 3): Patient will maintain speech intelligiblity at 90% and vocal intensity at [facility] during conversation with supervisionA cues. SLP Short Term Goal 4 (Week 3): Patient will demonstrate anticipatory awareness by describing her current level of function/deficits and how this would likely impact her function at home, with minA.  Skilled Therapeutic Interventions:Skilled ST services focused on swalllow skills. SLP facilitated regular textured lunch tray. SLP provided set up assist and cut chicken sandwich into finger food bites due to RUE weakness. Pt demonstrated slight prolonged mastication but ability to clear oral cavity with mod I for small bites. SLP upgraded diet to regular textures with continued full supervision for tolerance and likely to reduce to intermittent supervision with set up assist/cut into finger foods with follow up ST treatment. Pt demonstrated 80% speech intelligibility at the conversation level, mod I recalling speech strategies and ability to increase intelligibility to 90% with supervision A verbal cues for a period of time. SLP initiated discussion of anticipatory awareness and safety awareness in ALF or SNF, pt demonstrated good awareness in causal conversation. SLP and pt made a plan to reduce ST services to 30 minutes a day due to increase in alertness, mentation and carryover of  swallow/speech strategies. Pt was left in room with call bell within reach and chair alarm set. SLP recommends to continue skilled services.     Pain Pain Assessment Pain Score: 0-No pain  Therapy/Group: Individual Therapy  Fredrich Cory  [facility] 02/03/2020, 3:05 PM

## 2020-02-03 NOTE — Progress Notes (Signed)
Occupational Therapy Session Note  Patient Details  Name: [name] [name] MRN: [id number] Date of Birth: [birth year]  Today's Date: 02/03/2020 OT Individual Time: 1000-1104 OT Individual Time Calculation (min): 64 min    Short Term Goals: Week 3:  OT Short Term Goal 1 (Week 3): Pt will complete LB dressing using AE as needed with min assist OT Short Term Goal 2 (Week 3): Pt will complete sit to stand with min assist OT Short Term Goal 3 (Week 3): Pt will complete toileting with min assist. OT Short Term Goal 4 (Week 3): Pt will complete toilet transfer with mod assist  Skilled Therapeutic Interventions/Progress Updates:    Pt sitting up in recliner, no c/o pain, requesting to bathe at sink.  Pt completed squat pivot transfer recliner to w/c after visual demo provided by OT for body mechanic instruction, with mod assist.  Pt approached sink by self propelling w/c and doffed shirt with supervision.  Pt bathed UB with hand over hand to RUE to facilitate increased functional use.  Pt completed sit<>stand at sink and pulled pants over hips with min assist.  Pt doffed pants over feet with supervision.  Pt bathed LB with min assist including sit to stand at sink to wash buttocks.  Pt required VCs and TCs and min assist to facilitate forward weight shifting due to posterior leaning tendency.  Pt donned bra and shirt with min assist.  Pt donned brief and pants with min assist and increased time today which was improved from last session needing mod assist.  Pt brushed teeth sitting sinkside with min hand over hand to ensure tight grip on toothbrush in right hand when applying toothpaste.  Pt completed sit<>stand at stedy to return to recliner.  Call bell in reach, seat alarm on.    Therapy Documentation Precautions:  Precautions Precautions: Fall Precaution Comments: R-hemi; watch for subluxation Restrictions Weight Bearing Restrictions: No   Therapy/Group: Individual Therapy  Amie Critchley 02/03/2020, 4:29 PM

## 2020-02-03 NOTE — Progress Notes (Signed)
[place] PHYSICAL MEDICINE & REHABILITATION PROGRESS NOTE   Subjective/Complaints: Patient seen sitting up in her chair, eating breakfast this AM.  She states she slept well overnight, confirmed with sleep chart.  Son at bedside. Patient and son with questions regarding follow up appointment and discharge medications. She states she was able to ambulate yesterday.   ROS: Denies CP, SOB, N/V/D  Objective:   No results found. Recent Labs    02/02/20 0742  WBC 5.3  HGB 13.6  HCT 39.9  PLT 149*   Recent Labs    02/02/20 0643  NA 138  K 4.0  CL 101  CO2 27  GLUCOSE 154*  BUN 28*  CREATININE 1.45*  CALCIUM 9.4    Intake/Output Summary (Last 24 hours) at 02/03/2020 1013 Last data filed at 02/03/2020 0852 Gross per 24 hour  Intake 960 ml  Output --  Net 960 ml        Physical Exam: Vital Signs Blood pressure (!) 118/51, pulse 62, temperature 97.6 F (36.4 C), resp. rate 17, height 5\' 3"  (1.6 m), weight 101.3 kg, SpO2 94 %.  Constitutional: No distress . Vital signs reviewed. HENT: Normocephalic.  Atraumatic. Eyes: EOMI. No discharge. Cardiovascular: No JVD.  RRR. Respiratory: Normal effort.  No stridor.  Bilateral clear to auscultation. GI: Non-distended.  BS +. Skin: Warm and dry.  Intact. Psych: Normal mood.  Normal behavior. Musc: No edema in extremities.  No tenderness in extremities. Neuro: Alert Motor: RUE: 2+/5 proximal to distal with apraxia, improving RLE: 2+ to 3 -/5 HF, KE, 1/5 ADF with apraxia LUE and LLE 4-5/5  Assessment/Plan: 1. Functional deficits which require 3+ hours per day of interdisciplinary therapy in a comprehensive inpatient rehab setting.  Physiatrist is providing close team supervision and 24 hour management of active medical problems listed below.  Physiatrist and rehab team continue to assess barriers to discharge/monitor patient progress toward functional and medical goals  Care Tool:  Bathing    Body parts bathed by  patient: Chest, Abdomen, Right upper leg, Left upper leg, Face, Right arm, Left arm, Front perineal area   Body parts bathed by helper: Right arm, Chest, Abdomen, Right upper leg, Left upper leg, Right lower leg, Left lower leg, Face, Left arm, Front perineal area Body parts n/a: Left lower leg, Right lower leg, Buttocks   Bathing assist Assist Level: Supervision/Verbal cueing     Upper Body Dressing/Undressing Upper body dressing   What is the patient wearing?: Bra, Pull over shirt    Upper body assist Assist Level: Minimal Assistance - Patient > 75%    Lower Body Dressing/Undressing Lower body dressing      What is the patient wearing?: Incontinence brief, Pants     Lower body assist Assist for lower body dressing: Moderate Assistance - Patient 50 - 74%     Toileting Toileting Toileting Activity did not occur (Clothing management and hygiene only): N/A (no void or bm)  Toileting assist Assist for toileting: Moderate Assistance - Patient 50 - 74% (using stedy to stand for clothing mgt and pericare)     Transfers Chair/bed transfer  Transfers assist     Chair/bed transfer assist level: Maximal Assistance - Patient 25 - 49%     Locomotion Ambulation   Ambulation assist   Ambulation activity did not occur: Safety/medical concerns  Assist level: Maximal Assistance - Patient 25 - 49% Assistive device: Parallel bars Max distance: 8   Walk 10 feet activity   Assist  Walk 10  feet activity did not occur: Safety/medical concerns        Walk 50 feet activity   Assist Walk 50 feet with 2 turns activity did not occur: Safety/medical concerns         Walk 150 feet activity   Assist Walk 150 feet activity did not occur: Safety/medical concerns         Walk 10 feet on uneven surface  activity   Assist Walk 10 feet on uneven surfaces activity did not occur: Safety/medical concerns         Wheelchair     Assist Will patient use wheelchair at  discharge?: Yes Type of Wheelchair: Manual    Wheelchair assist level: Maximal Assistance - Patient 25 - 49% Max wheelchair distance: 25    Wheelchair 50 feet with 2 turns activity    Assist        Assist Level: Total Assistance - Patient < 25%   Wheelchair 150 feet activity     Assist      Assist Level: Total Assistance - Patient < 25%   Medical Problem List and Plan: 1. 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  Continue CIR  WHO/PRAFO nightly 2. Antithrombotics: -DVT/anticoagulation:Pharmaceutical:Heparin -antiplatelet therapy: N/a 3. Pain Management:Tylenol as needed.   Continue Voltaren gel  Right knee intra-articular steroid injection (supposed to have knee replacement in 02/2020) performed on 12/2  continue kpad and lidocaine patch for lower back with good results.   Controlled with meds on 12/7 4. Mood:LCSW to follow for evaluation and support when appropriate. -antipsychotic agents: N/A  Seroquel prn HS 25mg  per son.   Nursing order for lavender drops on forehead at night.   Amantadine 50 started on 12/1 with improvement 5. Neuropsych: This patientis notcapable of making decisions on herown behalf. 6. Skin/Wound Care:Routine pressure-relief measures. Maintain adequate nutrition and hydration status. 7. Fluids/Electrolytes/Nutrition:Monitor intake/output--  Will need assistance with feeding as due to fluctuating bouts of lethargy.  8. HTN: Monitor BP --continue Lasix, Hydralazine, Metoprolol  HCTZ changed to chlorthalidone on 11/25 , increased on 12/6            Vitals:   02/03/20 0633 02/03/20 0851  BP: (!) 125/35 (!) 118/51  Pulse: (!) 54 62  Resp: 17   Temp: 97.6 F (36.4 C)   SpO2: 94%    Improving on  12/7 9. Acute on chronic kidney injury: BUN/SCr 34/1.5 at admission.  Decreased lasix to 10mg   Creatinine 1.45 on 12/6  Encourage fluids 10. PNA?/Tracheobronchitis: Completed course of antibiotics on  11. Leukocytosis: Resolved   12. T2DM with hyperglycemia:  Monitor BS ac/hs.   CBG (last 3)  Recent Labs    02/02/20 1641 02/02/20 2104 02/03/20 0605  GLUCAP 235* 279* 186*   Lantus decreased to 48U, decreased to 35 twice daily on 11/23, decreased to 28 on 11/24, increased to 30 twice daily on 11/30, increase to 34 twice daily on 12/2  Labile on 12/7, monitor for trend 13. Post stroke dysphagia :             D3 thins, continue to advance diet as tolerated. 14.  Hyperkalemia  Potassium 4.0 on 12/6 15.  Slow transit constipation  Bowel meds increased on 11/28  Improving 16. Sleep disturbance  AM lethargy with Trazodone, d/ced  Overall improved 17.  Right knee OA  See #3 18.  Thrombocytopenia  Platelets 149 on 12/6  Continue to monitor   LOS: 18 days A FACE TO FACE EVALUATION WAS PERFORMED  Trigo Winterbottom Karis Juba 02/03/2020, 10:13 AM

## 2020-02-04 ENCOUNTER — Inpatient Hospital Stay (HOSPITAL_COMMUNITY): Payer: Medicare Other | Admitting: Occupational Therapy

## 2020-02-04 ENCOUNTER — Inpatient Hospital Stay (HOSPITAL_COMMUNITY): Payer: Medicare Other | Admitting: Speech Pathology

## 2020-02-04 ENCOUNTER — Inpatient Hospital Stay (HOSPITAL_COMMUNITY): Payer: Medicare Other

## 2020-02-04 LAB — GLUCOSE, CAPILLARY
Glucose-Capillary: 176 mg/dL — ABNORMAL HIGH (ref 70–99)
Glucose-Capillary: 252 mg/dL — ABNORMAL HIGH (ref 70–99)
Glucose-Capillary: 196 mg/dL — ABNORMAL HIGH (ref 70–99)
Glucose-Capillary: 266 mg/dL — ABNORMAL HIGH (ref 70–99)

## 2020-02-04 MED ORDER — INSULIN GLARGINE 100 UNIT/ML ~~LOC~~ SOLN
36.0000 [IU] | Freq: Two times a day (BID) | SUBCUTANEOUS | Status: DC
  Administered 2020-02-04 – 2020-02-06 (×4): 36 [IU] via SUBCUTANEOUS
  Filled 2020-02-04 (×5): qty 0.36

## 2020-02-04 NOTE — Progress Notes (Signed)
Recreational Therapy Session Note  Patient Details  Name: [name] [name] MRN: [id number] Date of Birth: Aug 16, 1932 Today's Date: 02/04/2020  Pain: no c/o Skilled Therapeutic Interventions/Progress Updates: Session focused on activity tolerance, dynamic standing balance, recognizing progress an discharge planning during co-treat with PT.  Pt appeared brighter today, talking about progress she had made with minimal cuing.  Pt sharing that she is hoping to go to a retirement community when she leaves to continue with therapy and regain further independence while not imposing on her children. LRT discussed leisure education/activity modifications & how she might become involved in activities/programs at next level of care.  Pt stated understanding.  Pt performed sit-stands during discussion with min assist for balance/R knee control and rest breaks between reps.  Pt ambulated 23' and 25' with RW and min/mod assist and mod instruction cues for technique.  Pt excited about walking and somewhat emotional.  Pt's daughter observed the last walk of 25' and was encouraged by progress as well.   Therapy/Group: Co-Treatment Rudolph Daoust 02/04/2020, 3:48 PM

## 2020-02-04 NOTE — Patient Care Conference (Signed)
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 Maryla Morrow Care Coodinator Present: Chana Bode, RN, BSN, CRRN;Becky Dupree, LCSW Nurse Present: Luevenia Maxin, LPN PT Present: Wynelle Link, PT OT Present: Dolphus Jenny, OT SLP Present: Elio Forget, SLP PPS Coordinator present : Fae Pippin, [place], PT     Current Status/Progress Goal Weekly Team Focus  Bowel/Bladder   pt continent of b/b. LBM 02/03/20  remain continent  Assist with toileting needs   Swallow/Nutrition/ Hydration   Regular textures and thin liquids, supervision-mod I  Mod I  carryover of swallow startegies and fading to intermittent supervision   ADL's   min/modA sit<>stand at sink,stedy, mod/maxA squat pivot transfers; supervision UB self care; modA LB self care  min assist  self care training, AE training, NMR RUE, functional transfers and standing balance   Mobility   modA bed mobility, mod/maxA stand<>pivot with RW,  sit<>stand fluctuates b/w min to modA from mat table but maxA from recliner. Has ambulated 6ft with heavy modA and RW with chair follow  Goals have been downgraded to modA  Functional transfers, R hemibody neuro re-ed, gait training with RW, dynamic standing balance. Will begin initiating w/c mobility training via hemi technique   Communication   Min-Supervision A  Supervision A  carryover of speech intelligibility strategies   Safety/Cognition/ Behavioral Observations  Min A  Min-Supervision A  antictpatory awareness   Pain   pt c/o 7/10 lower back pain. PRN tylenol  and kapad effective  remain pain free  Assess pain qshift/PRN   Skin   Skin intact  Maintain skin integrity  Assess sking Qshift/PRN     Discharge Planning:  Family looking into both SNF and ALF facilities, would like for pt to go to ALF if accepted so would not need to move twice. Will need ALF to come and eval pt to see if can accept   Team Discussion: Addition of amantadine "improved thinking" per patient. Staff note patient is more engaged with therapy and demonstrates more awareness. MD adjusting medications for BP and monitoring CKD and thrombocytopenia. Patient reports pain in low back managed with prn medications.  Patient on target to meet rehab goals: yes, currently mod assist for squat pivot transfers. Able to ambulate 35' with a rolling walker and mod assist. Can initiate steps with right side with knee blocking  *See Care Plan and progress notes for long and short-term goals.   Revisions to Treatment Plan:  Gait trials with rolling walker  Functional problem solving processing  Use of adaptive equipment trials  Teaching Needs: Transfers, toileting, medications, etc.  Current Barriers to Discharge: Decreased caregiver support and Home enviroment access/layout  Possible Resolutions to Barriers: ALF/SNF recommended     Medical Summary Current Status: Right hemiparesis with inattention, cognitive deficits  decreased initiation and delayed processing, dysphagia as well as expressive deficits affecting ability to carry out ADLs and mobility secondary to acute left thalamic and basal ganglia hemorrhage.  Barriers to Discharge: Medical stability;Decreased family/caregiver support   Possible Resolutions to [employer] Focus: Therapies, Follow labs - Cr, platelets, optimize BP meds, optimize DM meds   Continued Need for Acute Rehabilitation Level of Care: The patient requires daily medical management by a physician with specialized training in physical medicine and rehabilitation for the following reasons: Direction of a multidisciplinary physical rehabilitation program to maximize functional independence : Yes Medical management of patient stability for increased activity during participation in an intensive rehabilitation regime.: Yes Analysis of laboratory values and/or radiology reports with any subsequent need for medication adjustment and/or medical intervention. : Yes   I attest that I was present, lead the team conference, and concur with the assessment and plan of the team.   Chana Bode B 02/04/2020, 1:59 PM

## 2020-02-04 NOTE — Progress Notes (Signed)
Physical Therapy Session Note  Patient Details  Name: [name] [name] MRN: [id number] Date of Birth: 10/18/1932  Today's Date: 02/04/2020 PT Individual Time: 0900-1000 PT Individual Time Calculation (min): 60 min   Short Term Goals: Week 3:  PT Short Term Goal 1 (Week 3): Pt will complete bed mobility with no more than minA PT Short Term Goal 2 (Week 3): Pt will complete functional transfers, consistently, with modA and LRAD PT Short Term Goal 3 (Week 3): Pt will ambulate 23ft with maxA and LRAD  Skilled Therapeutic Interventions/Progress Updates:    Pt received in sitting on Stedy with NT present who was assisting with toileting needs. Handoff of care to PT. Pt denies any pain at start and throughout session. Daughter, [name] [name], at bedside. In perched position on Stedy, she was able to complete sit>stands with CGA to sink where she brushed her teeth with LUE with setupA. RN arriving shortly thereafter for morning medications where patient sat in perched position with good sitting balance noted. Stedy transfer for time management to her w/c and she was transported to main therapy gym with toatlA for energy conservation. She performed stand<>pivot transfer with mod/maxA from w/c to mat table with cues for R stepping and lateral weight shifting, monitoring R knee for buckling throughout. Rec therapy arriving for remainder of session to assist with motivation, encouragement, and redirection as needed and this appeared to provide improved participation/effort. She completed 2x5 sit<>stands with minA from slightly raised mat table with R knee block for safety. Required rest breaks for recovery b/w sets. Focused remainder of session on functional gait training. She was able to ambulate 40ft + 70ft (seated rest breaks) with min/modA and RW with chair follow for safety!! Pt emotional after completion and very excited with progress. Therapist assist with RW management, R knee blocking during stance, and lateral  weight shifting for offloading. Pt was then returned to her room in her w/c with totalA and performed stand<>pivot with modA for rising and maxA for steadying from w/c to her recliner. She ended session with seat belt alarm on, needs in reach, and and RUE supported with pillow.  Therapy Documentation Precautions:  Precautions Precautions: Fall Precaution Comments: R-hemi; watch for subluxation Restrictions Weight Bearing Restrictions: No  Therapy/Group: Individual Therapy  Damaria Stofko P Abhi Moccia PT 02/04/2020, 10:01 AM

## 2020-02-04 NOTE — Progress Notes (Signed)
[facility] PHYSICAL MEDICINE & REHABILITATION PROGRESS NOTE   Subjective/Complaints: Patient seen sitting up this morning.  She states she slept well overnight.  She states she feels better because she notes her cognition improving.  ROS: Denies CP, SOB, N/V/D  Objective:   No results found. Recent Labs    02/02/20 0742  WBC 5.3  HGB 13.6  HCT 39.9  PLT 149*   Recent Labs    02/02/20 0643  NA 138  K 4.0  CL 101  CO2 27  GLUCOSE 154*  BUN 28*  CREATININE 1.45*  CALCIUM 9.4    Intake/Output Summary (Last 24 hours) at 02/04/2020 1042 Last data filed at 02/04/2020 2458 Gross per 24 hour  Intake 1320 ml  Output --  Net 1320 ml        Physical Exam: Vital Signs Blood pressure (!) 132/56, pulse 65, temperature (!) 97.5 F (36.4 C), temperature source Oral, resp. rate 17, height 5\' 3"  (1.6 m), weight 101.3 kg, SpO2 97 %.  Constitutional: No distress . Vital signs reviewed. HENT: Normocephalic.  Atraumatic. Eyes: EOMI. No discharge. Cardiovascular: No JVD.  RRR. Respiratory: Normal effort.  No stridor.  Bilateral clear to auscultation. GI: Non-distended.  BS +. Skin: Warm and dry.  Intact. Psych: Normal mood.  Normal behavior. Musc: No edema in extremities.  No tenderness in extremities. Neuro: Alert Motor: RUE: 2+/5 proximal to distal with apraxia, stable RLE: 2+ to 3 -/5 HF, KE, 1/5 ADF with apraxia LUE and LLE 4-5/5  Assessment/Plan: 1. Functional deficits which require 3+ hours per day of interdisciplinary therapy in a comprehensive inpatient rehab setting.  Physiatrist is providing close team supervision and 24 hour management of active medical problems listed below.  Physiatrist and rehab team continue to assess barriers to discharge/monitor patient progress toward functional and medical goals  Care Tool:  Bathing    Body parts bathed by patient: Chest, Abdomen, Right upper leg, Left upper leg, Face, Right arm, Left arm, Front perineal area   Body  parts bathed by helper: Right arm, Chest, Abdomen, Right upper leg, Left upper leg, Right lower leg, Left lower leg, Face, Left arm, Front perineal area Body parts n/a: Left lower leg, Right lower leg, Buttocks   Bathing assist Assist Level: Supervision/Verbal cueing     Upper Body Dressing/Undressing Upper body dressing   What is the patient wearing?: Bra, Pull over shirt    Upper body assist Assist Level: Minimal Assistance - Patient > 75%    Lower Body Dressing/Undressing Lower body dressing      What is the patient wearing?: Incontinence brief, Pants     Lower body assist Assist for lower body dressing: Moderate Assistance - Patient 50 - 74%     Toileting Toileting Toileting Activity did not occur (Clothing management and hygiene only): N/A (no void or bm)  Toileting assist Assist for toileting: Moderate Assistance - Patient 50 - 74% (using stedy to stand for clothing mgt and pericare)     Transfers Chair/bed transfer  Transfers assist     Chair/bed transfer assist level: Maximal Assistance - Patient 25 - 49%     Locomotion Ambulation   Ambulation assist   Ambulation activity did not occur: Safety/medical concerns  Assist level: Maximal Assistance - Patient 25 - 49% Assistive device: Parallel bars Max distance: 8   Walk 10 feet activity   Assist  Walk 10 feet activity did not occur: Safety/medical concerns        Walk 50 feet activity  Assist Walk 50 feet with 2 turns activity did not occur: Safety/medical concerns         Walk 150 feet activity   Assist Walk 150 feet activity did not occur: Safety/medical concerns         Walk 10 feet on uneven surface  activity   Assist Walk 10 feet on uneven surfaces activity did not occur: Safety/medical concerns         Wheelchair     Assist Will patient use wheelchair at discharge?: Yes Type of Wheelchair: Manual    Wheelchair assist level: Maximal Assistance - Patient 25 -  49% Max wheelchair distance: 25    Wheelchair 50 feet with 2 turns activity    Assist        Assist Level: Total Assistance - Patient < 25%   Wheelchair 150 feet activity     Assist      Assist Level: Total Assistance - Patient < 25%   Medical Problem List and Plan: 1. 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  Continue CIR  WHO/PRAFO nightly  Team conference today to discuss current and goals and coordination of care, home and environmental barriers, and discharge planning with nursing, case manager, and therapies. Please see conference note from today as well.  2. Antithrombotics: -DVT/anticoagulation:Pharmaceutical:Heparin -antiplatelet therapy: N/a 3. Pain Management:Tylenol as needed.   Continue Voltaren gel  Right knee intra-articular steroid injection (supposed to have knee replacement in 02/2020) performed on 12/2  continue kpad and lidocaine patch for lower back with good results.   Controlled with meds on 12/8 4. Mood:LCSW to follow for evaluation and support when appropriate. -antipsychotic agents: N/A  Seroquel prn HS 25mg  per son.   Nursing order for lavender drops on forehead at night.   Amantadine 50 started on 12/1 with improvement 5. Neuropsych: This patientis notcapable of making decisions on herown behalf. 6. Skin/Wound Care:Routine pressure-relief measures. Maintain adequate nutrition and hydration status. 7. Fluids/Electrolytes/Nutrition:Monitor intake/output--  Will need assistance with feeding as due to fluctuating bouts of lethargy.  8. HTN: Monitor BP --continue Lasix, Hydralazine, Metoprolol  HCTZ changed to chlorthalidone on 11/25 , increased on 12/6            Vitals:   02/04/20 1000 02/04/20  1015  BP:  (!) 132/56  Pulse:  65  Resp:  17  Temp: (!) 97.5 F (36.4 C)   SpO2:  97%   Elevated,?  Improving on 12/8 9. Acute on chronic kidney injury: BUN/SCr 34/1.5 at admission.  Decreased lasix to 10mg   Creatinine 1.45 on 12/6  Encourage fluids 10. PNA?/Tracheobronchitis: Completed course of antibiotics on  11. Leukocytosis: Resolved   12. T2DM with hyperglycemia:  Monitor BS ac/hs.   CBG (last 3)  Recent Labs    02/03/20 1626 02/03/20 2049 02/04/20 0614  GLUCAP 165* 286* 176*   Lantus decreased to 48U, decreased to 35 twice daily on 11/23, decreased to 28 on 11/24, increased to 30 twice daily on 11/30, increase to 34 twice daily on 12/2, increased to 36 twice daily on 12/8  Labile on 12/7, monitor for trend 13. Post stroke dysphagia :             Advance to regular thins with supervision, continue to advance diet as tolerated. 14.  Hyperkalemia  Potassium 4.0 on 12/6 15.  Slow transit  constipation  Bowel meds increased on 11/28  Improving 16. Sleep disturbance  AM lethargy with Trazodone, d/ced  Improved 17.  Right knee OA  See #3 18.  Thrombocytopenia  Platelets 149 on 12/6, plan to order labs for the end of the week  Continue to monitor   LOS: 19 days A FACE TO FACE EVALUATION WAS PERFORMED  Psalm Arman Karis Juba 02/04/2020, 10:42 AM

## 2020-02-04 NOTE — Progress Notes (Signed)
Occupational Therapy Session Note  Patient Details  Name: [name] [name] MRN: [id number] Date of Birth: 1932/09/04  Today's Date: 02/04/2020 OT Individual Time: 1130-1200 OT Individual Time Calculation (min): 30 min    Short Term Goals: Week 3:  OT Short Term Goal 1 (Week 3): Pt will complete LB dressing using AE as needed with min assist OT Short Term Goal 2 (Week 3): Pt will complete sit to stand with min assist OT Short Term Goal 3 (Week 3): Pt will complete toileting with min assist. OT Short Term Goal 4 (Week 3): Pt will complete toilet transfer with mod assist  Skilled Therapeutic Interventions/Progress Updates:    Pt sitting up in recliner, requesting "I want to work on my arm please".  Pt participated in neuro re-ed to RUE with OT utilizing TCs, manual facilitation, and VCs to promote muscle recruitment and motor planning.  Pt completed shoulder FF using 1lb dowel, ER/IR AROM gravity eliminated, biceps curls using 1lb dowel, and triceps press with manual resistance and positioning in gravity eliminated plane.  Pt exhibited increased strength in right shoulder ER/IR and elbow extension of 3-/5 in both.  Call bell in reach, seat alarm on.  Therapy Documentation Precautions:  Precautions Precautions: Fall Precaution Comments: R-hemi; watch for subluxation Restrictions Weight Bearing Restrictions: No   Therapy/Group: Individual Therapy  Amie Critchley 02/04/2020, 12:14 PM

## 2020-02-04 NOTE — NC FL2 (Signed)
[place] MEDICAID FL2 LEVEL OF CARE SCREENING TOOL     IDENTIFICATION  Patient Name: [name] [name] Birthdate: 27-Feb-1933 Sex: female Admission Date (Current Location): 01/16/2020  [facility] and [place] Number:  Producer, television/film/video and Address:  [facility], [address], [place], [place] [address]      Provider Number: [id number]  Attending Physician Name and Address:  Marcello Fennel, MD  Relative Name and Phone Number:  Elna Radovich [phone]    Current Level of Care: Hospital Recommended Level of Care: Assisted Living Facility Prior Approval Number:    Date Approved/Denied:   PASRR Number: [id number]  Discharge Plan: SNF    Current Diagnoses: Patient Active Problem List   Diagnosis Date Noted  . Benign essential HTN   . Thrombocytopenia (HCC)   . Sleep disturbance   . Primary osteoarthritis of right knee   . Slow transit constipation   . Labile blood pressure   . Hypoglycemia   . Controlled type 2 diabetes mellitus with hyperglycemia (HCC)   . Labile blood glucose   . Hyperkalemia   . AKI (acute kidney injury) (HCC)   . Anxiety state   . 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 Lethargy   . Dysphagia, post-stroke   . Leukocytosis   . CKD (chronic kidney disease), stage III (HCC) 01/10/2020  . Morbid obesity (HCC)   . Chronic diastolic congestive heart failure (HCC)   . Uncontrolled type 2 diabetes mellitus with hyperglycemia (HCC)   . Essential hypertension   . FUO (fever of unknown origin)   . ICH (intracerebral hemorrhage) (HCC) 01/04/2020  . 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 Left epiretinal membrane 12/10/2019  . Pseudophakia, both eyes 12/10/2019  . DIABETES MELLITUS-TYPE II 06/01/2009  . HYPERLIPIDEMIA 06/01/2009  . HYPERTENSION 06/01/2009  . CHOLELITHIASIS 06/01/2009  . PANCREATITIS 06/01/2009  . PERSONAL HX COLONIC POLYPS 06/01/2009     Orientation RESPIRATION BLADDER Height & Weight     Self, Situation, Place, Time  Normal Continent Weight: 223 lb 5.2 oz (101.3 kg) Height:  5\' 3"  (160 cm)  BEHAVIORAL SYMPTOMS/MOOD NEUROLOGICAL BOWEL NUTRITION STATUS      Continent  (Regular thin liquids)  AMBULATORY STATUS COMMUNICATION OF NEEDS Skin   Limited Assist Verbally Normal                       Personal Care Assistance Level of Assistance  Bathing, Dressing Bathing Assistance: Limited assistance   Dressing Assistance: Limited assistance     Functional Limitations Info  Speech     Speech Info: Adequate    SPECIAL CARE FACTORS FREQUENCY  PT (By licensed PT), OT (By licensed OT), Speech therapy     PT Frequency: 5x week OT Frequency: 5x week     Speech Therapy Frequency: 3-5 x week      Contractures Contractures Info: Not present    Additional Factors Info  Code Status, Allergies Code Status Info: Full Code Allergies Info: Amlodipine, Invokana, Morphine and Ace Inhibitors           Current Medications (02/04/2020):  This is the current hospital active medication list Current Facility-Administered Medications  Medication Dose Route Frequency Provider Last Rate Last Admin  . acetaminophen (TYLENOL) tablet 325-650 mg  325-650 mg Oral Q4H PRN 14/09/2019, PA-C   650 mg at 02/04/20 0615  . albuterol (PROVENTIL) (2.5 MG/3ML) 0.083% nebulizer solution 2.5 mg  2.5 mg Nebulization Q6H PRN Love, Pamela S, PA-C      . alum & mag hydroxide-simeth (MAALOX/MYLANTA) 200-200-20 MG/5ML suspension 30 mL  30 mL Oral Q4H PRN Love, Pamela S, PA-C      . amantadine (SYMMETREL) 50 MG/5ML solution 50 mg  50 mg Oral BID WC Marcello Fennel, MD   50 mg at 02/04/20 1215  . bisacodyl (DULCOLAX) suppository 10 mg  10 mg Rectal Daily PRN Jacquelynn Cree, PA-C   10 mg at 01/25/20 0536  . blood pressure control book   Does not apply Once Marcello Fennel, MD      . chlorhexidine (PERIDEX) 0.12 % solution 15 mL  15 mL  Mouth Rinse BID Delle Reining S, PA-C   15 mL at 02/04/20 0900  . chlorthalidone (HYGROTON) tablet 37.5 mg  37.5 mg Oral Daily Marcello Fennel, MD   37.5 mg at 02/04/20 0858  . diclofenac Sodium (VOLTAREN) 1 % topical gel 2 g  2 g Topical QID Marcello Fennel, MD   2 g at 02/04/20 0900  . diphenhydrAMINE (BENADRYL) 12.5 MG/5ML elixir 12.5-25 mg  12.5-25 mg Oral Q6H PRN Jacquelynn Cree, PA-C   25 mg at 01/20/20 0555  . feeding supplement (GLUCERNA SHAKE) (GLUCERNA SHAKE) liquid 237 mL  237 mL Oral QHS Jacquelynn Cree, PA-C   237 mL at 02/03/20 2127  . furosemide (LASIX) tablet 10 mg  10 mg Oral Daily Erick Colace, MD   10 mg at 02/04/20 0859  . guaiFENesin-dextromethorphan (ROBITUSSIN DM) 100-10 MG/5ML syrup 5-10 mL  5-10 mL Oral Q6H PRN Marcello Fennel, MD      . heparin injection 5,000 Units  5,000 Units Subcutaneous Q8H Jacquelynn Cree, PA-C   5,000 Units at 02/04/20 1346  . hydrALAZINE (APRESOLINE) tablet 100 mg  100 mg Oral Q8H Love, Pamela S, PA-C   100 mg at 02/04/20 1346  . insulin aspart (novoLOG) injection 0-5 Units  0-5 Units Subcutaneous QHS Jacquelynn Cree, PA-C   3 Units at 02/03/20 2126  . insulin aspart (novoLOG) injection 0-9 Units  0-9 Units Subcutaneous TID WC Jacquelynn Cree, PA-C   5 Units at 02/04/20 1215  . insulin glargine (LANTUS) injection 36 Units  36 Units Subcutaneous BID Marcello Fennel, MD      . lidocaine (LIDODERM) 5 % 1 patch  1 patch Transdermal Daily Raulkar, Drema Pry, MD   1 patch at 02/04/20 0900  . lidocaine (XYLOCAINE) 2 % jelly   Topical PRN Love, Evlyn Kanner, PA-C      . Living Better with Heart Failure Book   Does not apply Once Marcello Fennel, MD      . MEDLINE mouth rinse  15 mL Mouth Rinse q12n4p Jacquelynn Cree, PA-C   15 mL at 02/03/20 1456  . metoprolol tartrate (LOPRESSOR) tablet 100 mg  100 mg Oral BID Jacquelynn Cree, PA-C   100 mg at 02/04/20 0857  . pantoprazole sodium (PROTONIX) 40 mg/20 mL oral suspension 40 mg  40 mg Oral Daily Jacquelynn Cree, PA-C   40 mg at 02/04/20 7510  . polyethylene glycol (MIRALAX / GLYCOLAX) packet 17 g  17 g Oral BID Marcello Fennel, MD   17 g at 02/03/20 2127  . prochlorperazine (COMPAZINE) tablet 5-10 mg  5-10 mg Oral Q6H PRN Love, Pamela S, PA-C       Or  . prochlorperazine (COMPAZINE) injection 5-10 mg  5-10  mg Intramuscular Q6H PRN Love, Pamela S, PA-C       Or  . prochlorperazine (COMPAZINE) suppository 12.5 mg  12.5 mg Rectal Q6H PRN Love, Pamela S, PA-C      . QUEtiapine (SEROQUEL) tablet 25 mg  25 mg Oral QHS PRN Raulkar, Drema Pry, MD      . senna-docusate (Senokot-S) tablet 1 tablet  1 tablet Oral BID Jacquelynn Cree, PA-C   1 tablet at 02/04/20 0858  . sodium phosphate (FLEET) 7-19 GM/118ML enema 1 enema  1 enema Rectal Once PRN Love, Evlyn Kanner, PA-C         Discharge Medications: Please see discharge summary for a list of discharge medications.  Relevant Imaging Results:  Relevant Lab Results:   Additional Information SSN: [id number] Has COVID vaccines  Teralyn Mullins, Lemar Livings, LCSW

## 2020-02-04 NOTE — Progress Notes (Signed)
Speech Language Pathology Daily Session Note  Patient Details  Name: [name] [name] MRN: [id number] Date of Birth: 1932/11/18  Today's Date: 02/04/2020 SLP Individual Time: 1400-1500 SLP Individual Time Calculation (min): 60 min  Short Term Goals: Week 3: SLP Short Term Goal 1 (Week 3): Patient will tolerate trials of regular solid meal tray without overt s/s of oral difficulty and without coughing/throat clearing with supervisionA SLP Short Term Goal 2 (Week 3): Patient will perform mild complex functional problem solving tasks with minA cues for 90% accuracy. SLP Short Term Goal 3 (Week 3): Patient will maintain speech intelligiblity at 90% and vocal intensity at [facility] during conversation with supervisionA cues. SLP Short Term Goal 4 (Week 3): Patient will demonstrate anticipatory awareness by describing her current level of function/deficits and how this would likely impact her function at home, with minA.  Skilled Therapeutic Interventions:   Patient seen for skilled ST session focusing on speech, voice. Her daughter was present at beginning of session SLP answered her questions regarding swallow safety and food consistencies. Patient has been recently upgraded to regular texture solids but both patient and daughter are a little hesitant to try certain foods. During speech session, SLP discussed patient's speech intelligibility and voice, as her main impairment at this time appears to be respiratory support and coordination with phonation leading to hoarse voice and pitch breaks. SLP introduced EMST trainer and patient performed after modeling/demonstration with resistance set at 8. Patient was very receptive to this and enthusiastic to practice in her room. Patient continues to benefit from skilled SLP intervention to maximize cognitive, speech, swallow function prior to discharge.  Pain Pain Assessment Pain Scale: 0-10 Pain Score: 3  Faces Pain Scale: No hurt Pain Location: Back Pain  Orientation: Lower Pain Intervention(s): Medication (See eMAR)  Therapy/Group: Individual Therapy   Angela Nevin, MA, CCC-SLP Speech Therapy

## 2020-02-04 NOTE — Progress Notes (Addendum)
Patient ID: [name] [name], female   DOB: 12-Oct-1932, 86 y.o.   MRN: [id number] Met with pt and daughter-[name] to face time with [facility] to see if would be able to take her and meet her needs. Awaiting PT note so can fax information to facility. Also faxing information to [facility] although pt would rather not go there and go to [facility] due to have visited there before and met some of their staff when her friend was there. Will fax information and await acceptance.  2:00 PM have faxed information to [facility]. Jacqualin Combes of team conference update and progress this week.  4:00 Information sent to [facility] for consideration for admission

## 2020-02-04 NOTE — Progress Notes (Signed)
Occupational Therapy Session Note  Patient Details  Name: [name] [name] MRN: [id number] Date of Birth: 01/08/1933  Today's Date: 02/04/2020 OT Individual Time: 6761-9509 OT Individual Time Calculation (min): 28 min    Short Term Goals: Week 3:  OT Short Term Goal 1 (Week 3): Pt will complete LB dressing using AE as needed with min assist OT Short Term Goal 2 (Week 3): Pt will complete sit to stand with min assist OT Short Term Goal 3 (Week 3): Pt will complete toileting with min assist. OT Short Term Goal 4 (Week 3): Pt will complete toilet transfer with mod assist  Skilled Therapeutic Interventions/Progress Updates:    Pt greeted at time of session sitting up in recliner agreeable to OT session despite fatigue, saying she had just gotten to "dozing off" but agreeable. Squat pivot to L side with Max A despite cues for proper body mechanics, scooting to edge of chair, and L hand placement on wheelchair arm. Transported to gym dependently for time management, performed FMC/GMC activities for RUE including reaching and crossing midline for multiple small objects with focus on controlled grasp/release and functional grasping patterns. Min hand over hand and distal support and wrist provided. Transported back to room in same manner and squat pivot back to recliner in same manner as above. Pt needing to use bathroom, NT notified and pt aware NT coming to assist d/t time contraints. Alarm on, call bell in reach.  Therapy Documentation Precautions:  Precautions Precautions: Fall Precaution Comments: R-hemi; watch for subluxation Restrictions Weight Bearing Restrictions: No     Therapy/Group: Individual Therapy  Erasmo Score 02/04/2020, 4:48 PM

## 2020-02-04 NOTE — Progress Notes (Signed)
Patient ID: KANDYCE DIEGUEZ, female   DOB: Jul 07, 1932, 86 y.o.   MRN: [id number] Met with all four children yesterday and pt is aware of the discussion. Plan is to pursue ALF[facility] and [facility] and also SNF[facility]. Family would like for Mom to go to an ALF facility if they are able to meet her needs so she would not have to move after 2-3 weeks when insurance no longer covers. If she can go directly to ALF that would be the ultimate goal. Will reach out to both ALF's regarding evaluations and send information to facilities. Pt did very well today and ambulated with PT mod assist with rolling walker, which is a big improvement from yesterday. Continue to work on a plan.

## 2020-02-05 ENCOUNTER — Inpatient Hospital Stay (HOSPITAL_COMMUNITY): Payer: Medicare Other | Admitting: Physical Therapy

## 2020-02-05 ENCOUNTER — Inpatient Hospital Stay (HOSPITAL_COMMUNITY): Payer: Medicare Other | Admitting: Occupational Therapy

## 2020-02-05 ENCOUNTER — Inpatient Hospital Stay (HOSPITAL_COMMUNITY): Payer: Medicare Other

## 2020-02-05 LAB — GLUCOSE, CAPILLARY
Glucose-Capillary: 213 mg/dL — ABNORMAL HIGH (ref 70–99)
Glucose-Capillary: 224 mg/dL — ABNORMAL HIGH (ref 70–99)
Glucose-Capillary: 214 mg/dL — ABNORMAL HIGH (ref 70–99)
Glucose-Capillary: 283 mg/dL — ABNORMAL HIGH (ref 70–99)

## 2020-02-05 NOTE — Progress Notes (Signed)
[facility] PHYSICAL MEDICINE & REHABILITATION PROGRESS NOTE   Subjective/Complaints: Patient seen sitting up in bed this morning, eating breakfast.  She states she slept well overnight, confirmed with sleep chart.  She denies complaints.  ROS: Denies CP, SOB, N/V/D  Objective:   No results found. No results for input(s): WBC, HGB, HCT, PLT in the last 72 hours. No results for input(s): NA, K, CL, CO2, GLUCOSE, BUN, CREATININE, CALCIUM in the last 72 hours.  Intake/Output Summary (Last 24 hours) at 02/05/2020 1200 Last data filed at 02/05/2020 0735 Gross per 24 hour  Intake 598 ml  Output --  Net 598 ml        Physical Exam: Vital Signs Blood pressure 128/60, pulse 63, temperature 98.4 F (36.9 C), resp. rate 16, height 5\' 3"  (1.6 m), weight 101.3 kg, SpO2 90 %.  Constitutional: No distress . Vital signs reviewed. HENT: Normocephalic.  Atraumatic. Eyes: EOMI. No discharge. Cardiovascular: No JVD.  RRR. Respiratory: Normal effort.  No stridor.  Bilateral clear to auscultation. GI: Non-distended.  BS +. Skin: Warm and dry.  Intact. Psych: Normal mood.  Normal behavior. Musc: No edema in extremities.  No tenderness in extremities. Neuro: Alert Motor: RUE: 2+/5 proximal to distal with apraxia, stable RLE: 2+ to 3 -/5 HF, KE, 2-/5 ADF with apraxia  Assessment/Plan: 1. Functional deficits which require 3+ hours per day of interdisciplinary therapy in a comprehensive inpatient rehab setting.  Physiatrist is providing close team supervision and 24 hour management of active medical problems listed below.  Physiatrist and rehab team continue to assess barriers to discharge/monitor patient progress toward functional and medical goals  Care Tool:  Bathing    Body parts bathed by patient: Chest,Abdomen,Right upper leg,Left upper leg,Face,Right arm,Left arm,Front perineal area   Body parts bathed by helper: Right arm,Chest,Abdomen,Right upper leg,Left upper leg,Right lower  leg,Left lower leg,Face,Left arm,Front perineal area Body parts n/a: Left lower leg,Right lower leg,Buttocks   Bathing assist Assist Level: Supervision/Verbal cueing     Upper Body Dressing/Undressing Upper body dressing   What is the patient wearing?: Bra,Pull over shirt    Upper body assist Assist Level: Minimal Assistance - Patient > 75%    Lower Body Dressing/Undressing Lower body dressing      What is the patient wearing?: Incontinence brief,Pants     Lower body assist Assist for lower body dressing: Moderate Assistance - Patient 50 - 74%     Toileting Toileting Toileting Activity did not occur (Clothing management and hygiene only): N/A (no void or bm)  Toileting assist Assist for toileting: Moderate Assistance - Patient 50 - 74% (using stedy to stand for clothing mgt and pericare)     Transfers Chair/bed transfer  Transfers assist     Chair/bed transfer assist level: Maximal Assistance - Patient 25 - 49%     Locomotion Ambulation   Ambulation assist   Ambulation activity did not occur: Safety/medical concerns  Assist level: 2 helpers (chair follow for safety) Assistive device: Walker-rolling Max distance: 33ft   Walk 10 feet activity   Assist  Walk 10 feet activity did not occur: Safety/medical concerns  Assist level: 2 helpers (chair follow for safety) Assistive device: Walker-rolling   Walk 50 feet activity   Assist Walk 50 feet with 2 turns activity did not occur: Safety/medical concerns         Walk 150 feet activity   Assist Walk 150 feet activity did not occur: Safety/medical concerns         Walk  10 feet on uneven surface  activity   Assist Walk 10 feet on uneven surfaces activity did not occur: Safety/medical concerns         Wheelchair     Assist Will patient use wheelchair at discharge?: Yes Type of Wheelchair: Manual    Wheelchair assist level: Maximal Assistance - Patient 25 - 49% Max wheelchair  distance: 25    Wheelchair 50 feet with 2 turns activity    Assist        Assist Level: Total Assistance - Patient < 25%   Wheelchair 150 feet activity     Assist      Assist Level: Total Assistance - Patient < 25%   Medical Problem List and Plan: 1. 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  Continue CIR  WHO/PRAFO nightly 2. Antithrombotics: -DVT/anticoagulation:Pharmaceutical:Heparin -antiplatelet therapy: N/a 3. Pain Management:Tylenol as needed.   Continue Voltaren gel  Right knee intra-articular steroid injection (supposed to have knee replacement in 02/2020) performed on 12/2  continue kpad and lidocaine patch for lower back with good results.   Improved with meds on 12/9 4. Mood:LCSW to follow for evaluation and support when appropriate. -antipsychotic agents: N/A  Seroquel prn HS 25mg  per son.   Nursing order for lavender drops on forehead at night.   Amantadine 50 started on 12/1 with improvement 5. Neuropsych: This patientis notcapable of making decisions on herown behalf. 6. Skin/Wound Care:Routine pressure-relief measures. Maintain adequate nutrition and hydration status. 7. Fluids/Electrolytes/Nutrition:Monitor intake/output--  Will need assistance with feeding as due to fluctuating bouts of lethargy.  8. HTN: Monitor BP --continue Lasix, Hydralazine, Metoprolol  HCTZ changed to chlorthalidone on 11/25 , increased on 12/6            Vitals:   02/05/20 0608 02/05/20 1002  BP: (!) 130/39 128/60  Pulse: (!) 57 63  Resp:    Temp:    SpO2:     Relatively controlled on 12/9 9. Acute on chronic kidney injury: BUN/SCr 34/1.5 at admission.  Decreased lasix to 10mg   Creatinine 1.45 on 12/6  Encourage  fluids 10. PNA?/Tracheobronchitis: Completed course of antibiotics on  11. Leukocytosis: Resolved   12. T2DM with hyperglycemia:  Monitor BS ac/hs.   CBG (last 3)  Recent Labs    02/04/20 1655 02/04/20 2130 02/05/20 0546  GLUCAP 196* 266* 213*   Lantus decreased to 48U, decreased to 35 twice daily on 11/23, decreased to 28 on 11/24, increased to 30 twice daily on 11/30, increase to 34 twice daily on 12/2, increased to 36 twice daily on 12/8  Remains elevated on 12/9, will consider further increase in medications if persistent 13. Post stroke dysphagia :             Advance to regular thins with supervision, continue to advance diet as tolerated. 14.  Hyperkalemia  Potassium 4.0 on 12/6 15.  Slow transit constipation  Bowel meds increased on 11/28  Improving 16. Sleep disturbance  AM lethargy with Trazodone, d/ced  Improved 17.  Right knee OA  See #3 18.  Thrombocytopenia  Platelets 149 on 12/6, labs ordered for tomorrow  Continue to monitor   LOS: 20 days A FACE TO FACE EVALUATION WAS PERFORMED  [place] 12/28 02/05/2020, 12:00 PM

## 2020-02-05 NOTE — Progress Notes (Signed)
Physical Therapy Session Note  Patient Details  Name: [name] [name] MRN: [id number] Date of Birth: 06/23/1932  Today's Date: 02/05/2020 PT Individual Time: 0900-1000 PT Individual Time Calculation (min): 60 min   Short Term Goals: Week 3:  PT Short Term Goal 1 (Week 3): Pt will complete bed mobility with no more than minA PT Short Term Goal 2 (Week 3): Pt will complete functional transfers, consistently, with modA and LRAD PT Short Term Goal 3 (Week 3): Pt will ambulate 50ft with maxA and LRAD  Skilled Therapeutic Interventions/Progress Updates:    Pt received sitting in w/c, daughter [name] at bedside, RN also present delivering morning medications. Pt reports no pain. She was transported with totalA for energy conservation from her room to main therapy gym. Performed stand>pivot transfer with modA for both rising and steadying, from w/c to mat table. Able to sit EOM with supervision and then performed repeated sit>stands with minA for powering to rising from lowered mat table to no AD, cues for controlled lowering. She then performed dynamic standing with task overlay for reaching red clothespins and using pincher grasp to place on basketball net that was placed on R side, promoting R weight bearing. She required modA for balance for completing this and she was able to complete x6 clothespins prior to fatigue. Attempted to engage her in functional gait training however pt defer's this time due to feeling of general fatigue. Of note, her BP was soft when RN assessed prior to mobility training. Focused remainder of session on sitting balance and core strengthening. Performed thoracic rotations with 1kg med ball as well as forward/backward reaching with 1kg med ball, requiring occasional hand-over-hand assist for RUE grasp. Stand>pivot with modA from mat table to w/c with cues throughout for sequencing. She was returned to her room where she performed additional stand>pivot with modA to recliner and  was therapeutically positioned with RUE propped with pillow and needs in reach with chair alarm on. Daughter at bedside.  Therapy Documentation Precautions:  Precautions Precautions: Fall Precaution Comments: R-hemi; watch for subluxation Restrictions Weight Bearing Restrictions: No   Therapy/Group: Individual Therapy  Shanina Kepple P Laurianne Floresca PT 02/05/2020, 7:25 AM

## 2020-02-05 NOTE — Progress Notes (Addendum)
Patient ID: [name] [name], female   DOB: 1932/10/05, 86 y.o.   MRN: [id number] Met with pt and [name]-dauther to inform [facility] will be calling at 1:00 to do a face to face evaluation regarding eligibility for their facility. [facility] has accepted pt. Family wants to see if [facility] will take so she can have a choice.  1:00 pm Spoke with [facility] psychologist of [facility] along with daughter and pt. She reported they can call back next week to re-evaluate they have no ability to use lifting equipment. Informed at times will need to use a lift to get into bed at night when she is tired. Pt wants to go to [facility] she feels she knows the facility better and feels more welcomed there. Await decision by pt and family and move forward with decision

## 2020-02-05 NOTE — Progress Notes (Signed)
Occupational Therapy Session Note  Patient Details  Name: [name] [name] MRN: [id number] Date of Birth: [birth year]  Today's Date: 02/05/2020 OT Individual Time: 4097-3532 OT Individual Time Calculation (min): 58 min    Short Term Goals: Week 3:  OT Short Term Goal 1 (Week 3): Pt will complete LB dressing using AE as needed with min assist OT Short Term Goal 2 (Week 3): Pt will complete sit to stand with min assist OT Short Term Goal 3 (Week 3): Pt will complete toileting with min assist. OT Short Term Goal 4 (Week 3): Pt will complete toilet transfer with mod assist  Skilled Therapeutic Interventions/Progress Updates:    Pt sitting up in recliner with dtr present throughout session.  Pt with no c/o pain, reporting feeling a little tired but requesting to take a shower during OT session.  Pt completed sit<>stand with CGA stedy transfer from recliner to TTB. Pt doffed pants and pullup brief with min assist at sit/stand level.  Pt doffed shirt and bra with supervision, including unclasping bra.  Pt bathed UB/LB (not including buttocks and peri-region per pt wanting to defer this) using long handled sponge with supervision.  Pt completed sit to stand at stedy with CGA and transported to w/c stand to sit with min assist to slow descent.  Pt donned bra with min assist for clasping in back and shirt with setup.  Pt donned pullup brief and pants using reacher with mod VCs for problem solving and min assist to complete at sit/stand level at sinkside.  Shoes donned with dependence for time mgt only.  Pt completed stand pivot transfer w/c to recliner with min assist at RW using right hand splint and frequent VCs/TCs for sequencing of transfer and RW mgt.  Pt also required VCs intermittently throughout to facilitate increased RUE functional use as needed with min manual assist at end range to successfully reach for stedy bar, RW handle, and when washing hair. Improved independence during all functional  transfers noted today.  Pt would benefit from further training on stand pivot sequencing and body mechanics for increased independence. Call bell in reach, bed alarm on.    Therapy Documentation Precautions:  Precautions Precautions: Fall Precaution Comments: R-hemi; watch for subluxation Restrictions Weight Bearing Restrictions: No   Therapy/Group: Individual Therapy  Amie Critchley 02/05/2020, 4:00 PM

## 2020-02-05 NOTE — Progress Notes (Signed)
Physical Therapy Session Note  Patient Details  Name: [name] [name] MRN: [id number] Date of Birth: 01-Dec-1932  Today's Date: 02/05/2020 PT Individual Time: 0900-1000 PT Individual Time Calculation (min): 60 min   Short Term Goals: Week 2:  PT Short Term Goal 1 (Week 2): Pt will complete bed mobility with no more than modA PT Short Term Goal 2 (Week 2): Pt will perform bed<>chair transfers with modA and LRAD PT Short Term Goal 3 (Week 2): Pt will ambulate 36f with modA +2 with LRAD  Skilled Therapeutic Interventions/Progress Updates:     Pt received in recliner and agreeable to PT. Pt performed stand pivot transfer to bed with RW and mod assist for foot placement and RLE blocking in stance phase. Pt participated in FIST (see details below). Swing pivot transfer to wc with max assist for setup and unweighting of bottom. Pt transported to hallway for wc propulsion.  WC Propulsion: -pt propelled wc ~334f ~10031f using hemi technique with supervision and visual cueing and demonstration. Pt needs reminders to use her LLE to help propel herself. Attempted with both LE, but pt found too difficult at this time.   Furniture Transfer to RecNewell RubbermaidStand pivot transfer with +2 providing hand held assist and mod assist for trunk control and knee blocking on L.  -Swing pivot from recliner to wc with mod assist +2.  Pt transported back to room and completed a squat pivot transfer with max assist back to recliner. Pt left in recliner with call bell in reach, chair alarm on, and needs met. No pain reported this session.    Function In Sitting Test (FIST)  (1/2 femur on surface; hips/knees flexed to 90deg) - indicate bed or mat table / step stool if used  SCORING KEY: 4 = Independent (completes task independently & successfully) 3 = Verbal Cues/Increased Time (completes task independently & successfully and only needs more time/cues) 2 = Upper Extremity Support (must use UE for support or  assistance to complete successfully) 1 = Needs Assistance (unable to complete w/o physical assist; DOCUMENT LEVEL: min, mod, max) 0 = Dependent (requires complete physical assist; unable to complete successfully even w/ physical assist)  Randomly Administer Once Throughout Exam  - Anterior Nudge (superior sternum) : 4 - Posterior Nudge (between scapular spines) : 4 - Lateral Nudge (to dominant side at acromion) : 4    - Static sitting (30 seconds) : 4 - Sitting, shake 'no' (left and right) : 4 - Sitting, eyes closed (30 seconds) : 4  - Sitting, lift foot (dominant side, lift foot 1 inch twice) : 4   - Pick up object from behind (object at midline, hands breadth posterior) : 3  - Forward reach (use dominant arm, must complete full motion) : 4 - Lateral reach (use dominant arm, clear opposite ischial tuberosity) : 4 - Pick up object from floor (from between feet) : 3  - Posterior scooting (move backwards 2 inches) : 3 - Anterior scooting (move forward 2 inches) : 4 - Lateral scooting (move to dominant side 2 inches) : 3   TOTAL = 51/56  Notes/comments: Provided CGA for picking up object off of the floor. Placed step under foot in order to make correct position of 90 degrees of hip/knee flexion.   MCD > 5 points MCID for IP REHAB > 6 points  Pt improved FIST score from 13/56 on initial evaluation to 51/56 this date.   Therapy Documentation Precautions:  Precautions Precautions: Fall Precaution Comments: R-hemi; watch  for subluxation Restrictions Weight Bearing Restrictions: No Vital Signs: Therapy Vitals Temp: (!) 97.3 F (36.3 C) Pulse Rate: (!) 59 Resp: 18 BP: (!) 148/48 Patient Position (if appropriate): Sitting Oxygen Therapy SpO2: 97 % O2 Device: Room Air    Therapy/Group: Individual Therapy  Gaylord Shih 02/05/2020, 2:09 PM

## 2020-02-05 NOTE — Progress Notes (Signed)
BP 128/38 ; Dan PA notified;  order to recheck manually 128/60 ; ok to give bp meds. Patient asymptomatic

## 2020-02-06 ENCOUNTER — Inpatient Hospital Stay (HOSPITAL_COMMUNITY): Payer: Medicare Other | Admitting: Occupational Therapy

## 2020-02-06 ENCOUNTER — Inpatient Hospital Stay (HOSPITAL_COMMUNITY): Payer: Medicare Other | Admitting: Physical Therapy

## 2020-02-06 ENCOUNTER — Inpatient Hospital Stay (HOSPITAL_COMMUNITY): Payer: Medicare Other | Admitting: Speech Pathology

## 2020-02-06 ENCOUNTER — Inpatient Hospital Stay (HOSPITAL_COMMUNITY): Payer: Medicare Other

## 2020-02-06 LAB — CBC WITH DIFFERENTIAL/PLATELET
Abs Immature Granulocytes: 0.01 10*3/uL (ref 0.00–0.07)
Basophils Absolute: 0 10*3/uL (ref 0.0–0.1)
Basophils Relative: 1 %
Eosinophils Absolute: 0.2 10*3/uL (ref 0.0–0.5)
Eosinophils Relative: 5 %
HCT: 38.1 % (ref 36.0–46.0)
Hemoglobin: 12.4 g/dL (ref 12.0–15.0)
Immature Granulocytes: 0 %
Lymphocytes Relative: 26 %
Lymphs Abs: 1.3 10*3/uL (ref 0.7–4.0)
MCH: 30 pg (ref 26.0–34.0)
MCHC: 32.5 g/dL (ref 30.0–36.0)
MCV: 92.3 fL (ref 80.0–100.0)
Monocytes Absolute: 0.7 10*3/uL (ref 0.1–1.0)
Monocytes Relative: 13 %
Neutro Abs: 2.8 10*3/uL (ref 1.7–7.7)
Neutrophils Relative %: 55 %
Platelets: 142 10*3/uL — ABNORMAL LOW (ref 150–400)
RBC: 4.13 MIL/uL (ref 3.87–5.11)
RDW: 15.1 % (ref 11.5–15.5)
WBC: 5.1 10*3/uL (ref 4.0–10.5)
nRBC: 0 % (ref 0.0–0.2)

## 2020-02-06 LAB — GLUCOSE, CAPILLARY
Glucose-Capillary: 143 mg/dL — ABNORMAL HIGH (ref 70–99)
Glucose-Capillary: 253 mg/dL — ABNORMAL HIGH (ref 70–99)
Glucose-Capillary: 182 mg/dL — ABNORMAL HIGH (ref 70–99)
Glucose-Capillary: 243 mg/dL — ABNORMAL HIGH (ref 70–99)

## 2020-02-06 MED ORDER — INSULIN GLARGINE 100 UNIT/ML ~~LOC~~ SOLN
37.0000 [IU] | Freq: Two times a day (BID) | SUBCUTANEOUS | Status: DC
  Administered 2020-02-06 – 2020-02-07 (×2): 37 [IU] via SUBCUTANEOUS
  Filled 2020-02-06 (×4): qty 0.37

## 2020-02-06 MED ORDER — PANTOPRAZOLE SODIUM 40 MG PO TBEC
40.0000 mg | DELAYED_RELEASE_TABLET | Freq: Every day | ORAL | Status: DC
  Administered 2020-02-07 – 2020-02-12 (×6): 40 mg via ORAL
  Filled 2020-02-06: qty 2
  Filled 2020-02-06 (×5): qty 1

## 2020-02-06 MED ORDER — SENNOSIDES-DOCUSATE SODIUM 8.6-50 MG PO TABS
1.0000 | ORAL_TABLET | Freq: Every day | ORAL | Status: DC
  Administered 2020-02-09: 1 via ORAL
  Filled 2020-02-06 (×2): qty 1

## 2020-02-06 MED ORDER — AMANTADINE HCL 100 MG PO CAPS
100.0000 mg | ORAL_CAPSULE | Freq: Every day | ORAL | Status: DC
  Administered 2020-02-06 – 2020-02-12 (×7): 100 mg via ORAL
  Filled 2020-02-06 (×7): qty 1

## 2020-02-06 NOTE — Progress Notes (Signed)
Physical Therapy Session Note  Patient Details  Name: [name] [name] MRN: [id number] Date of Birth: 06-11-1932  Today's Date: 02/06/2020 PT Individual Time: 1000-1045 PT Individual Time Calculation (min): 45 min   Short Term Goals: Week 3:  PT Short Term Goal 1 (Week 3): Pt will complete bed mobility with no more than minA PT Short Term Goal 2 (Week 3): Pt will complete functional transfers, consistently, with modA and LRAD PT Short Term Goal 3 (Week 3): Pt will ambulate 4ft with maxA and LRAD  Skilled Therapeutic Interventions/Progress Updates:    Patient received sitting up in recliner with granddaughter present, agreeable to PT. She denies pain, but reports frustration about not getting dressed in a timely manner this morning. Patient able to transfer to wc via stand pivot with MaxA. PT propelling patient to therapy gym for time management and energy conservation. Patient attempting to come to stand with RW to transfer to therapy mat, but was unable to come to full standing despite MaxA from PT. Patient transferring to therapy may ModA via squat pivot. Patient completing sit <> stands from mat with HHA, MinA. Mat height slightly elevated from standard chair height, eventually decreasing height to standard chair height. Patient remains with limited weight bearing through R LE in transition to stance and in standing. Patient completing complex puzzle in standing with MinA + L LE on 2" box to encourage further weight bearing through RLE. Patient requiring Max cuing to complete puzzle correctly. MinA for postural support progressing to ModA to maintain weight shift onto R LE. Patient with uncontrolled descent back onto mat due to R knee buckling. Patient transferring back to wc via stand pivot with ModA and back to recliner with ModA. Chair alarm on, call light within reach, granddaughter at bedside.   Therapy Documentation Precautions:  Precautions Precautions: Fall Precaution Comments:  R-hemi; watch for subluxation Restrictions Weight Bearing Restrictions: No    Therapy/Group: Individual Therapy  Elizebeth Koller, PT, DPT, CBIS  02/06/2020, 7:47 AM

## 2020-02-06 NOTE — Progress Notes (Signed)
Speech Language Pathology Weekly Progress and Session Note  Patient Details  Name: [name] [name] MRN: [id number] Date of Birth: 1932-03-18  Beginning of progress report period: 01/29/2020 End of progress report period: 02/05/2020 Today's Date: 02/06/2020 SLP Individual Time: 0900-0930 SLP Individual Time Calculation (min): 30 min  Short Term Goals: Week 3: SLP Short Term Goal 1 (Week 3): Patient will tolerate trials of regular solid meal tray without overt s/s of oral difficulty and without coughing/throat clearing with supervisionA SLP Short Term Goal 1 - Progress (Week 3): Met SLP Short Term Goal 2 (Week 3): Patient will perform mild complex functional problem solving tasks with minA cues for 90% accuracy. SLP Short Term Goal 2 - Progress (Week 3): Met SLP Short Term Goal 3 (Week 3): Patient will maintain speech intelligiblity at 90% and vocal intensity at [facility] during conversation with supervisionA cues. SLP Short Term Goal 3 - Progress (Week 3): Met SLP Short Term Goal 4 (Week 3): Patient will demonstrate anticipatory awareness by describing her current level of function/deficits and how this would likely impact her function at home, with minA. SLP Short Term Goal 4 - Progress (Week 3): Met    New Short Term Goals: Week 4: SLP Short Term Goal 1 (Week 4): STG's=LTG's (ELOS: d/c anticipated 12/16)  Weekly Progress Updates:  Patient has made very good progress and met all of her STG's. She is currently tolerating a Regular solids, thin liquids diet with only setup assistance and intermittent supervision. Her cognition has improved significantly and she is on track to meet all LTG's related to cognitive function. Primary deficit at this time is her voice, which has improved but is still hoarse and patient with decreased respiratory support and respiratory and phonatory coordination.   Intensity: Minumum of 1-2 x/day, 30 to 90 minutes Frequency: 3 to 5 out of 7 days Duration/Length of  Stay: 12/16 Treatment/Interventions: Cognitive remediation/compensation;Functional tasks;Internal/external aids;Cueing hierarchy;Patient/family education;Medication managment;Multimodal communication approach;Environmental controls;Dysphagia/aspiration precaution training;Speech/Language facilitation   Daily Session  Skilled Therapeutic Interventions: Patient seen for skilled ST session focusing on speech, voice goals. Patient performed EMST set at resistance of 7 and was able to perform in sets of 10 but with declining effort by 6-7 rep. Patient feels that after performing this exercise that her voice sounds better. SLP directed patient in controlled pursed lip breathing and sustained phonation. Patient was able to return demonstrate without difficulty and SLP left written instructions. Patient continues to benefit from skilled SLP intervention prior to discharge.     General    Pain Pain Assessment Pain Scale: 0-10 Pain Score: 0-No pain  Therapy/Group: Individual Therapy  Sonia Baller, MA, CCC-SLP Speech Therapy

## 2020-02-06 NOTE — Progress Notes (Signed)
Occupational Therapy Session Note  Patient Details  Name: [name] [name] MRN: [id number] Date of Birth: 1933/01/17  Today's Date: 02/06/2020 OT Individual Time: 1400-1500 OT Individual Time Calculation (min): 60 min    Short Term Goals: Week 3:  OT Short Term Goal 1 (Week 3): Pt will complete LB dressing using AE as needed with min assist OT Short Term Goal 2 (Week 3): Pt will complete sit to stand with min assist OT Short Term Goal 3 (Week 3): Pt will complete toileting with min assist. OT Short Term Goal 4 (Week 3): Pt will complete toilet transfer with mod assist  Skilled Therapeutic Interventions/Progress Updates:    Pt sitting up in recliner, no c/o pain, grandaughter present during first half of session and then daughter present for second half of session.  Pt participated in blocked practice functional stand pivot transfers from and to various surfaces to increase independence and safety during self care.  Pt completed stand pivot using RW with right hand splint recliner>w/c requiring mod assist for sit<>stand portion and min assist and step by step VCs for sequencing and RW mgt for pivot portion of transfer.  Pt completed stand pivot w/c<>3 in 1 commode using grab bars with same level of assist needed as previously mentioned. Educated pt using Consulting civil engineer and proper forward weight shifting (due to pt keeping weight shifted back during sit<>stand). Pt completed stand pivot using RW w/c>EOB and EOB >recliner with min assist and improved carryover of weight shifting technique. Pt completed sit<>supine with min assist and VCs for body mechanics using bed rail. Pt continues to require step by step VCs for sequencing of foot and RW placement during pivot after blocked practice and training but improved weight shifting with decreased assist during sit<>stand. Pt also able to reach up to RW splint handle with RUE independently today. Call bell in  reach, bed alarm on.  Therapy Documentation Precautions:  Precautions Precautions: Fall Precaution Comments: R-hemi; watch for subluxation Restrictions Weight Bearing Restrictions: No   Therapy/Group: Individual Therapy  Amie Critchley 02/06/2020, 4:35 PM

## 2020-02-06 NOTE — Progress Notes (Signed)
[place] PHYSICAL MEDICINE & REHABILITATION PROGRESS NOTE   Subjective/Complaints: No complaints Back pain is well controlled Had 2 BM yesterday and feels this is too much for her.  Sleeping well at night.   ROS: Denies CP, SOB, N/V/D, pain  Objective:   No results found. Recent Labs    02/06/20 0416  WBC 5.1  HGB 12.4  HCT 38.1  PLT 142*   No results for input(s): NA, K, CL, CO2, GLUCOSE, BUN, CREATININE, CALCIUM in the last 72 hours.  Intake/Output Summary (Last 24 hours) at 02/06/2020 1231 Last data filed at 02/06/2020 8676 Gross per 24 hour  Intake 586 ml  Output --  Net 586 ml        Physical Exam: Vital Signs Blood pressure (!) 145/42, pulse 62, temperature 98 F (36.7 C), temperature source Oral, resp. rate 17, height 5\' 3"  (1.6 m), weight 101.3 kg, SpO2 95 %.   General: Alert, No apparent distress HEENT: Head is normocephalic, atraumatic, PERRLA, EOMI, sclera anicteric, oral mucosa pink and moist, dentition intact, ext ear canals clear,  Neck: Supple without JVD or lymphadenopathy Heart: Reg rate and rhythm. No murmurs rubs or gallops Chest: CTA bilaterally without wheezes, rales, or rhonchi; no distress Abdomen: Soft, non-tender, non-distended, bowel sounds positive. Extremities: No clubbing, cyanosis, or edema. Pulses are 2+ Skin: Clean and intact without signs of breakdown Psych: Normal mood.  Normal behavior. Musc: No edema in extremities.  No tenderness in extremities. Neuro: Alert Motor: RUE: 2+/5 proximal to distal with apraxia, stable RLE: 2+ to 3 -/5 HF, KE, 2-/5 ADF with apraxia  Assessment/Plan: 1. Functional deficits which require 3+ hours per day of interdisciplinary therapy in a comprehensive inpatient rehab setting.  Physiatrist is providing close team supervision and 24 hour management of active medical problems listed below.  Physiatrist and rehab team continue to assess barriers to discharge/monitor patient progress toward  functional and medical goals  Care Tool:  Bathing    Body parts bathed by patient: Chest,Abdomen,Right upper leg,Left upper leg,Face,Right arm,Left arm,Right lower leg,Left lower leg   Body parts bathed by helper: Right arm,Chest,Abdomen,Right upper leg,Left upper leg,Right lower leg,Left lower leg,Face,Left arm,Front perineal area Body parts n/a: Left lower leg,Right lower leg,Buttocks   Bathing assist Assist Level: Supervision/Verbal cueing     Upper Body Dressing/Undressing Upper body dressing   What is the patient wearing?: Bra,Pull over shirt    Upper body assist Assist Level: Minimal Assistance - Patient > 75%    Lower Body Dressing/Undressing Lower body dressing      What is the patient wearing?: Pants,Underwear/pull up     Lower body assist Assist for lower body dressing: Minimal Assistance - Patient > 75%     Toileting Toileting Toileting Activity did not occur (Clothing management and hygiene only): N/A (no void or bm)  Toileting assist Assist for toileting: Moderate Assistance - Patient 50 - 74% (using stedy to stand for clothing mgt and pericare)     Transfers Chair/bed transfer  Transfers assist     Chair/bed transfer assist level: Maximal Assistance - Patient 25 - 49%     Locomotion Ambulation   Ambulation assist   Ambulation activity did not occur: Safety/medical concerns  Assist level: 2 helpers (chair follow for safety) Assistive device: Walker-rolling Max distance: 45ft   Walk 10 feet activity   Assist  Walk 10 feet activity did not occur: Safety/medical concerns  Assist level: 2 helpers (chair follow for safety) Assistive device: Walker-rolling   Walk 50 feet  activity   Assist Walk 50 feet with 2 turns activity did not occur: Safety/medical concerns         Walk 150 feet activity   Assist Walk 150 feet activity did not occur: Safety/medical concerns         Walk 10 feet on uneven surface  activity   Assist Walk  10 feet on uneven surfaces activity did not occur: Safety/medical concerns         Wheelchair     Assist Will patient use wheelchair at discharge?: Yes Type of Wheelchair: Manual    Wheelchair assist level: Maximal Assistance - Patient 25 - 49% Max wheelchair distance: 25    Wheelchair 50 feet with 2 turns activity    Assist        Assist Level: Total Assistance - Patient < 25%   Wheelchair 150 feet activity     Assist      Assist Level: Total Assistance - Patient < 25%   Medical Problem List and Plan: 1. 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  Continue CIR  WHO/PRAFO nightly 2. Antithrombotics: -DVT/anticoagulation:Pharmaceutical:Heparin -antiplatelet therapy: N/a 3. Pain Management:Tylenol as needed.   Continue Voltaren gel  Right knee intra-articular steroid injection (supposed to have knee replacement in 02/2020) performed on 12/2  continue kpad and lidocaine patch for lower back with good results.   Improved with meds on 12/9 4. Mood:LCSW to follow for evaluation and support when appropriate. -antipsychotic agents: N/A  Seroquel prn HS 25mg  per son.   Nursing order for lavender drops on forehead at night.   Amantadine 50 started on 12/1 with improvement 5. Neuropsych: This patientis notcapable of making decisions on herown behalf. 6. Skin/Wound Care:Routine pressure-relief measures. Maintain adequate nutrition and hydration status. 7. Fluids/Electrolytes/Nutrition:Monitor intake/output--  Will need assistance with feeding as due to fluctuating bouts of lethargy.  8. HTN: Monitor BP --continue Lasix, Hydralazine, Metoprolol  HCTZ changed to chlorthalidone on 11/25 , increased on 12/6            Vitals:    02/05/20 2259 02/06/20 0418  BP: (!) 158/50 (!) 145/42  Pulse:  62  Resp:  17  Temp:  98 F (36.7 C)  SpO2:  95%   Relatively controlled on 12/9 9. Acute on chronic kidney injury: BUN/SCr 34/1.5 at admission.  Decreased lasix to 10mg   Creatinine 1.45 on 12/6  Encourage fluids 10. PNA?/Tracheobronchitis: Completed course of antibiotics on  11. Leukocytosis: Resolved   12. T2DM with hyperglycemia:  Monitor BS ac/hs.   CBG (last 3)  Recent Labs    02/05/20 2118 02/06/20 0605 02/06/20 1117  GLUCAP 283* 143* 253*   Lantus decreased to 48U, decreased to 35 twice daily on 11/23, decreased to 28 on 11/24, increased to 30 twice daily on 11/30, increase to 34 twice daily on 12/2, increased to 36 twice daily on 12/8  Elevated to 283 12/9- increase lantus to 37U 13. Post stroke dysphagia :             Advance to regular thins with supervision, continue to advance diet as tolerated. 14.  Hyperkalemia  Potassium 4.0 on 12/6 15.  Slow transit constipation  Bowel meds increased on 11/28  Improving 16. Sleep disturbance  AM lethargy with Trazodone, d/ced  Improved.  17.  Right knee OA  See #3 18.  Thrombocytopenia  Platelets 149 on 12/6, 142 on 12/10, repeat on Monday.   Continue to monitor  19. Constipation: patient had 2 BM yesterday. Decrease senna-docusate to daily.   LOS: 21 days A FACE TO FACE EVALUATION WAS PERFORMED  Drema Pry Fidencia Mccloud 02/06/2020, 12:31 PM

## 2020-02-06 NOTE — Progress Notes (Signed)
Patient ID: [name] [name], female   DOB: 11-07-1932, 86 y.o.   MRN: [id number]  Met with pt who reports they have decided upon [facility] for her discharge. Have ordered hospital bed, wheelchair and bedside commode via [employer]. Can deliver hospital bed to the facility and wheelchair and bedside commode to her room. Pt happy about this but is sad over not being able to go home from here. Continue to work plan for Thursday. Will need TB Monday, Pam-PA aware.

## 2020-02-06 NOTE — Progress Notes (Signed)
Recreational Therapy Session Note  Patient Details  Name: [name] [name] MRN: [id number] Date of Birth: 1932/07/20 Today's Date: 02/06/2020  Pain: no c/o Skilled Therapeutic Interventions/Progress Updates: Session focused on activity tolerance, standing balance, RLE weightbearing during co-treat with PT.  Pt stood with LLE on 2" block to facilitate weightbearing through RLE while working on tabletop puzzle.  Pt required min- mod assist for balance.  Discussed discharge planning during seated rest breaks with emphasis on leisure education and involvement in retirement community activities.  Pt stated excitement about being involved and hopefully enjoying some nightlife!  Therapy/Group: Co-Treatment Curlie Macken 02/06/2020, 12:44 PM

## 2020-02-06 NOTE — Progress Notes (Signed)
Physical Therapy Session Note  Patient Details  Name: [name] [name] MRN: [id number] Date of Birth: October 29, 1932  Today's Date: 02/06/2020 PT Individual Time: 1640-1736 PT Individual Time Calculation (min): 56 min   Short Term Goals: Week 3:  PT Short Term Goal 1 (Week 3): Pt will complete bed mobility with no more than minA PT Short Term Goal 2 (Week 3): Pt will complete functional transfers, consistently, with modA and LRAD PT Short Term Goal 3 (Week 3): Pt will ambulate 68ft with maxA and LRAD  Skilled Therapeutic Interventions/Progress Updates:    Pt received sitting in recliner with her daughter, Elita Quick, present and pt agreeable to therapy session though reporting significant fatigue. Forward scoot towards edge of recliner with max cuing for head/hips relationship to reciprocally scoot with mod facilitation at R pelvis - pt able to place R hand on armrest without assist to incorporate into the task and promote WBing. R stand pivot recliner>w/c using L UE support on therapist, no AD, with mod assist for lifting to stand and then heavy mod assist for balance while turning - therapist facilitating R/L weight shift and cuing for sequencing of LE stepping and to step backwards with R LE as pt tended to step it too far forward causing worsening balance - guarding R knee but noticed it tended to hyperextend as opposed to buckle.  Transported to/from gym in w/c for time management and energy conservation. Pt sates "I've stood all I can stand today" declining standing activities and then reports her goal is to become mod-I at w/c level to allow increased independence at the facility following D/C. Performed L hemi-technique w/c propulsion ~112ft to/from and through ADL apartment, allowed rest breaks as needed, pt noted to bump into doorways and obstacles on R side throughout but able to complete w/c mobility with supervision and verbal cuing for safety - propelled with decreased speed and required increased  time to complete turns especially in smaller spaces. During w/c mobility pt reports R UE discomfort due to it not being able to stay supported on w/c armrest - may benefit from 1/2 lap tray during this task as long as it does not interfere propulsion. R stand pivot w/c>EOM, no AD but L UE support on therapist, with mod assist for lifting into stand and for balance while turning - continuing to facilitate R/L weight shifting to promote stepping with cuing for sequencing and continued cuing to step back with R LE - again guarding R knee but no buckling. Sitting EOM focused on R UE NMR via bimanual task of having pt fold a towel - requires max cuing and significantly increased time for R UE incorporation into task. Performed R UE AAROM pushing forward and pulling backwards with shoulder adducted by side then performed gentle 2x47minute stretching into shoulder external rotation as significantly decreased ROM noted. L lateral scoot EOM>w/c with lighter mod assist for pivoting hips, assist for R LE repositioning during transfer, and continued cuing for head/hips relationship and increased anterior trunk lean - pt declining stand pivot due to fatigue. Transported back to room and pt requesting to return to recliner. L stand pivot w/c>recliner, no AD but L UE support on therapist, with mod assist for lifting to stand and balance while turning - continued cuing/facilitation as above. Prior to sitting pt performed ~4minute standing balance challenge via performing L UE movements inside BOS to cause minor balance perturbations with pt requiring min assist to recover and cuing for sustained trunk/hip extension for upright posture. At  end of session, pt left sitting in recliner with needs in reach, seat belt alarm on, R UE therapeutically positioned on pillows, and her daughter present.  Therapy Documentation Precautions:  Precautions Precautions: Fall Precaution Comments: R-hemi; watch for  subluxation Restrictions Weight Bearing Restrictions: No  Pain:   Reports some R UE discomfort when extremity not supported fully during w/c mobility - assisted with repositioning on armrest.    Therapy/Group: Individual Therapy  Ginny Forth , PT, DPT, CSRS  02/06/2020, 3:40 PM

## 2020-02-07 LAB — GLUCOSE, CAPILLARY
Glucose-Capillary: 167 mg/dL — ABNORMAL HIGH (ref 70–99)
Glucose-Capillary: 243 mg/dL — ABNORMAL HIGH (ref 70–99)
Glucose-Capillary: 285 mg/dL — ABNORMAL HIGH (ref 70–99)
Glucose-Capillary: 266 mg/dL — ABNORMAL HIGH (ref 70–99)

## 2020-02-07 MED ORDER — INSULIN GLARGINE 100 UNIT/ML ~~LOC~~ SOLN
38.0000 [IU] | Freq: Two times a day (BID) | SUBCUTANEOUS | Status: DC
  Administered 2020-02-07 – 2020-02-10 (×6): 38 [IU] via SUBCUTANEOUS
  Filled 2020-02-07 (×7): qty 0.38

## 2020-02-07 NOTE — Progress Notes (Signed)
[facility] PHYSICAL MEDICINE & REHABILITATION PROGRESS NOTE   Subjective/Complaints: No complaints Family at bedside Denies pain, constipation, insomnia CBGs uncontrolled  ROS: Denies CP, SOB, N/V/D, pain  Objective:   No results found. Recent Labs    02/06/20 0416  WBC 5.1  HGB 12.4  HCT 38.1  PLT 142*   No results for input(s): NA, K, CL, CO2, GLUCOSE, BUN, CREATININE, CALCIUM in the last 72 hours.  Intake/Output Summary (Last 24 hours) at 02/07/2020 1557 Last data filed at 02/07/2020 1300 Gross per 24 hour  Intake 657 ml  Output --  Net 657 ml        Physical Exam: Vital Signs Blood pressure (!) 134/47, pulse (!) 57, temperature (!) 97.4 F (36.3 C), resp. rate 14, height 5\' 3"  (1.6 m), weight 101.3 kg, SpO2 94 %.  Gen: no distress, normal appearing HEENT: oral mucosa pink and moist, NCAT Cardio: Bradycardic Chest: normal effort, normal rate of breathing Abd: soft, non-distended Ext: no edema Psych: Normal mood.  Normal behavior. Musc: No edema in extremities.  No tenderness in extremities. Neuro: Alert Motor: RUE: 2+/5 proximal to distal with apraxia, stable RLE: 2+ to 3 -/5 HF, KE, 2-/5 ADF with apraxia  Assessment/Plan: 1. Functional deficits which require 3+ hours per day of interdisciplinary therapy in a comprehensive inpatient rehab setting.  Physiatrist is providing close team supervision and 24 hour management of active medical problems listed below.  Physiatrist and rehab team continue to assess barriers to discharge/monitor patient progress toward functional and medical goals  Care Tool:  Bathing    Body parts bathed by patient: Chest,Abdomen,Right upper leg,Left upper leg,Face,Right arm,Left arm,Right lower leg,Left lower leg   Body parts bathed by helper: Right arm,Chest,Abdomen,Right upper leg,Left upper leg,Right lower leg,Left lower leg,Face,Left arm,Front perineal area Body parts n/a: Left lower leg,Right lower leg,Buttocks    Bathing assist Assist Level: Supervision/Verbal cueing     Upper Body Dressing/Undressing Upper body dressing   What is the patient wearing?: Bra,Pull over shirt    Upper body assist Assist Level: Minimal Assistance - Patient > 75%    Lower Body Dressing/Undressing Lower body dressing      What is the patient wearing?: Pants,Underwear/pull up     Lower body assist Assist for lower body dressing: Minimal Assistance - Patient > 75%     Toileting Toileting Toileting Activity did not occur (Clothing management and hygiene only): N/A (no void or bm)  Toileting assist Assist for toileting: Moderate Assistance - Patient 50 - 74% (using stedy to stand for clothing mgt and pericare)     Transfers Chair/bed transfer  Transfers assist     Chair/bed transfer assist level: Moderate Assistance - Patient 50 - 74%     Locomotion Ambulation   Ambulation assist   Ambulation activity did not occur: Safety/medical concerns  Assist level: 2 helpers (chair follow for safety) Assistive device: Walker-rolling Max distance: 13ft   Walk 10 feet activity   Assist  Walk 10 feet activity did not occur: Safety/medical concerns  Assist level: 2 helpers (chair follow for safety) Assistive device: Walker-rolling   Walk 50 feet activity   Assist Walk 50 feet with 2 turns activity did not occur: Safety/medical concerns         Walk 150 feet activity   Assist Walk 150 feet activity did not occur: Safety/medical concerns         Walk 10 feet on uneven surface  activity   Assist Walk 10 feet on uneven surfaces  activity did not occur: Safety/medical concerns         Wheelchair     Assist Will patient use wheelchair at discharge?: Yes Type of Wheelchair: Manual    Wheelchair assist level: Supervision/Verbal cueing,Set up assist Max wheelchair distance: 133ft    Wheelchair 50 feet with 2 turns activity    Assist        Assist Level:  Supervision/Verbal cueing   Wheelchair 150 feet activity     Assist      Assist Level: Supervision/Verbal cueing   Medical Problem List and Plan: 1. 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  Continue CIR  WHO/PRAFO nightly 2. Antithrombotics: -DVT/anticoagulation:Pharmaceutical:Heparin -antiplatelet therapy: N/a 3. Pain Management:Tylenol as needed.   Continue Voltaren gel  Right knee intra-articular steroid injection (supposed to have knee replacement in 02/2020) performed on 12/2  continue kpad and lidocaine patch for lower back with good results.   Well controlled 12/11: continue to monitor.  4. Mood:LCSW to follow for evaluation and support when appropriate. -antipsychotic agents: N/A  Seroquel prn HS 25mg  per son.   Nursing order for lavender drops on forehead at night.   Amantadine 50 started on 12/1 with improvement 5. Neuropsych: This patientis notcapable of making decisions on herown behalf. 6. Skin/Wound Care:Routine pressure-relief measures. Maintain adequate nutrition and hydration status. 7. Fluids/Electrolytes/Nutrition:Monitor intake/output--  Will need assistance with feeding as due to fluctuating bouts of lethargy.  8. HTN: Monitor BP --continue Lasix, Hydralazine, Metoprolol  HCTZ changed to chlorthalidone on 11/25 , increased on 12/6            Vitals:   02/07/20 0417 02/07/20 1324  BP: (!) 157/52 (!) 134/47  Pulse: 75 (!) 57  Resp: 18 14  Temp: (!) 97.5 F (36.4 C) (!) 97.4 F (36.3 C)  SpO2: 97% 94%   Diastolic low, systolic high at times- continue to monitor.  9. Acute on chronic kidney injury: BUN/SCr 34/1.5 at admission.  Decreased lasix to 10mg   Creatinine 1.45 on 12/6  Encourage fluids 10.  PNA?/Tracheobronchitis: Completed course of antibiotics on  11. Leukocytosis: Resolved   12. T2DM with hyperglycemia:  Monitor BS ac/hs.   CBG (last 3)  Recent Labs    02/06/20 2058 02/07/20 0703 02/07/20 1130  GLUCAP 243* 167* 243*   Lantus decreased to 48U, decreased to 35 twice daily on 11/23, decreased to 28 on 11/24, increased to 30 twice daily on 11/30, increase to 34 twice daily on 12/2, increased to 36 twice daily on 12/8  CBGs 167-243 12/11: increase lantus to 38U 13. Post stroke dysphagia :             Advance to regular thins with supervision, continue to advance diet as tolerated. 14.  Hyperkalemia  Potassium 4.0 on 12/6 15.  Slow transit constipation  Bowel meds increased on 11/28  Improving 16. Sleep disturbance  AM lethargy with Trazodone, d/ced  Improved.  17.  Right knee OA  See #3 18.  Thrombocytopenia  Platelets 149 on 12/6, 142 on 12/10, repeat on Monday.   Continue to monitor  19. Constipation: patient had 2 BM yesterday. Decrease senna-docusate to daily.   LOS: 22 days A FACE TO FACE EVALUATION WAS PERFORMED  14/10 Carleigh Buccieri 02/07/2020, 3:57 PM

## 2020-02-08 ENCOUNTER — Inpatient Hospital Stay (HOSPITAL_COMMUNITY): Payer: Medicare Other

## 2020-02-08 ENCOUNTER — Inpatient Hospital Stay (HOSPITAL_COMMUNITY): Payer: Medicare Other | Admitting: Speech Pathology

## 2020-02-08 LAB — GLUCOSE, CAPILLARY
Glucose-Capillary: 129 mg/dL — ABNORMAL HIGH (ref 70–99)
Glucose-Capillary: 200 mg/dL — ABNORMAL HIGH (ref 70–99)
Glucose-Capillary: 188 mg/dL — ABNORMAL HIGH (ref 70–99)
Glucose-Capillary: 244 mg/dL — ABNORMAL HIGH (ref 70–99)

## 2020-02-08 MED ORDER — QUETIAPINE FUMARATE 25 MG PO TABS
12.5000 mg | ORAL_TABLET | Freq: Every evening | ORAL | Status: DC | PRN
Start: 2020-02-08 — End: 2020-02-12

## 2020-02-08 NOTE — Progress Notes (Signed)
Speech Language Pathology Daily Session Note  Patient Details  Name: [name] [name] MRN: [id number] Date of Birth: January 28, 1933  Today's Date: 02/08/2020 SLP Individual Time: 1020-1100 SLP Individual Time Calculation (min): 40 min  Short Term Goals: Week 4: SLP Short Term Goal 1 (Week 4): STG's=LTG's (ELOS: d/c anticipated 12/16)  Skilled Therapeutic Interventions:  Pt was seen for skilled ST targeting speech goals.  Pt reported that she had been using EMST device diligently in between therapy sessions since introduced on 12/8.  She needed min instructional cues for accurate return demonstration; however, once cued she was able to maintain correct technique and rated an effort level of 7/10.  Pt also needed min faded to supervision verbal cues to return demonstration of pursed lip breathing exercises.  Pt was able to slow rate and increase vocal intensity to achieve intelligibility in conversations with mod I during today's therapy session.  Pt was left in recliner with son at bedside.  Continue per current plan of care.    Pain Pain Assessment Pain Scale: 0-10 Pain Score: 0-No pain  Therapy/Group: Individual Therapy  Rutledge Selsor, Melanee Spry 02/08/2020, 12:41 PM

## 2020-02-08 NOTE — Progress Notes (Signed)
[facility] PHYSICAL MEDICINE & REHABILITATION PROGRESS NOTE   Subjective/Complaints: [name] and her son have no complaints Educated that her CBGs have been uncontrolled. Patient states she was given juice yesterday and she drank it. Asked if I can placed order to request no juice and patient and son are agreeable to this.   ROS: Denies CP, SOB, N/V/D, pain  Objective:   No results found. Recent Labs    02/06/20 0416  WBC 5.1  HGB 12.4  HCT 38.1  PLT 142*   No results for input(s): NA, K, CL, CO2, GLUCOSE, BUN, CREATININE, CALCIUM in the last 72 hours.  Intake/Output Summary (Last 24 hours) at 02/08/2020 1452 Last data filed at 02/08/2020 1300 Gross per 24 hour  Intake 660 ml  Output --  Net 660 ml        Physical Exam: Vital Signs Blood pressure (!) 129/53, pulse (!) 58, temperature (!) 97.2 F (36.2 C), temperature source Oral, resp. rate 16, height 5\' 3"  (1.6 m), weight 101.3 kg, SpO2 99 %.  Gen: no distress, normal appearing HEENT: oral mucosa pink and moist, NCAT Cardio: Bradycardic Chest: normal effort, normal rate of breathing Abd: soft, non-distended Ext: no edema Skin: intact Musc: No edema in extremities.  No tenderness in extremities. Neuro: Alert Motor: RUE: 2+/5 proximal to distal with apraxia, stable RLE: 2+ to 3 -/5 HF, KE, 2-/5 ADF with apraxia  Assessment/Plan: 1. Functional deficits which require 3+ hours per day of interdisciplinary therapy in a comprehensive inpatient rehab setting.  Physiatrist is providing close team supervision and 24 hour management of active medical problems listed below.  Physiatrist and rehab team continue to assess barriers to discharge/monitor patient progress toward functional and medical goals  Care Tool:  Bathing    Body parts bathed by patient: Chest,Abdomen,Right upper leg,Left upper leg,Face,Right arm,Left arm,Right lower leg,Left lower leg   Body parts bathed by helper: Right arm,Chest,Abdomen,Right  upper leg,Left upper leg,Right lower leg,Left lower leg,Face,Left arm,Front perineal area Body parts n/a: Left lower leg,Right lower leg,Buttocks   Bathing assist Assist Level: Supervision/Verbal cueing     Upper Body Dressing/Undressing Upper body dressing   What is the patient wearing?: Bra,Pull over shirt    Upper body assist Assist Level: Minimal Assistance - Patient > 75%    Lower Body Dressing/Undressing Lower body dressing      What is the patient wearing?: Pants,Underwear/pull up     Lower body assist Assist for lower body dressing: Minimal Assistance - Patient > 75%     Toileting Toileting Toileting Activity did not occur (Clothing management and hygiene only): N/A (no void or bm)  Toileting assist Assist for toileting: Moderate Assistance - Patient 50 - 74% (using stedy to stand for clothing mgt and pericare)     Transfers Chair/bed transfer  Transfers assist     Chair/bed transfer assist level: Moderate Assistance - Patient 50 - 74%     Locomotion Ambulation   Ambulation assist   Ambulation activity did not occur: Safety/medical concerns  Assist level: 2 helpers (chair follow for safety) Assistive device: Walker-rolling Max distance: 76ft   Walk 10 feet activity   Assist  Walk 10 feet activity did not occur: Safety/medical concerns  Assist level: 2 helpers (chair follow for safety) Assistive device: Walker-rolling   Walk 50 feet activity   Assist Walk 50 feet with 2 turns activity did not occur: Safety/medical concerns         Walk 150 feet activity   Assist Walk  150 feet activity did not occur: Safety/medical concerns         Walk 10 feet on uneven surface  activity   Assist Walk 10 feet on uneven surfaces activity did not occur: Safety/medical concerns         Wheelchair     Assist Will patient use wheelchair at discharge?: Yes Type of Wheelchair: Manual    Wheelchair assist level: Supervision/Verbal  cueing,Set up assist Max wheelchair distance: 147ft    Wheelchair 50 feet with 2 turns activity    Assist        Assist Level: Supervision/Verbal cueing   Wheelchair 150 feet activity     Assist      Assist Level: Supervision/Verbal cueing   Medical Problem List and Plan: 1. 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  Continue CIR  WHO/PRAFO nightly 2. Antithrombotics: -DVT/anticoagulation:Pharmaceutical:Heparin -antiplatelet therapy: N/a 3. Pain Management:Tylenol as needed.   Continue Voltaren gel  Right knee intra-articular steroid injection (supposed to have knee replacement in 02/2020) performed on 12/2  continue kpad and lidocaine patch for lower back with good results.   Well controlled 12/12: continue to monitor.  4. Mood:LCSW to follow for evaluation and support when appropriate. -antipsychotic agents: N/A  Seroquel prn HS 25mg  per son.   12/12: discussed with son weaning seroquel to 12.5mg  and he is agreeable.   Nursing order for lavender drops on forehead at night.   Amantadine 50 started on 12/1 with improvement 5. Neuropsych: This patientis notcapable of making decisions on herown behalf. 6. Skin/Wound Care:Routine pressure-relief measures. Maintain adequate nutrition and hydration status. 7. Fluids/Electrolytes/Nutrition:Monitor intake/output--  Will need assistance with feeding as due to fluctuating bouts of lethargy.  8. HTN: Monitor BP --continue Lasix, Hydralazine, Metoprolol  HCTZ changed to chlorthalidone on 11/25 , increased on 12/6            Vitals:   02/08/20 0312 02/08/20 1336  BP: (!) 152/47 (!) 129/53  Pulse: (!) 56 (!) 58  Resp: 18 16  Temp: 98.2 F (36.8 C) (!) 97.2 F (36.2 C)  SpO2: 98%  99%   BP labile- continue to monitor.  9. Acute on chronic kidney injury: BUN/SCr 34/1.5 at admission.  Decreased lasix to 10mg   Creatinine 1.45 on 12/6  Encourage fluids 10. PNA?/Tracheobronchitis: Completed course of antibiotics on  11. Leukocytosis: Resolved   12. T2DM with hyperglycemia:  Monitor BS ac/hs.   CBG (last 3)  Recent Labs    02/07/20 2119 02/08/20 0620 02/08/20 1136  GLUCAP 266* 129* 200*   Lantus decreased to 48U, decreased to 35 twice daily on 11/23, decreased to 28 on 11/24, increased to 30 twice daily on 11/30, increase to 34 twice daily on 12/2, increased to 36 twice daily on 12/8  CBGs 14/2 12/11: increase lantus to 38U  12/12: CBGs uncontrolled: placed nursing order to request no juice and educated patient and son that juice will spike her CBGs.  13. Post stroke dysphagia :             Advance to regular thins with supervision, continue to advance diet as tolerated. 14.  Hyperkalemia  Potassium 4.0 on 12/6 15.  Slow transit constipation  Bowel meds increased on 11/28  Improving 16. Sleep disturbance  AM lethargy with Trazodone, d/ced  Improved.  17.  Right knee OA  See #  3 18.  Thrombocytopenia  Platelets 149 on 12/6, 142 on 12/10, repeat on Monday.   Continue to monitor  19. Constipation: patient had 2 BM yesterday. Decrease senna-docusate to daily.   LOS: 23 days A FACE TO FACE EVALUATION WAS PERFORMED  Drema Pry Dezaree Tracey 02/08/2020, 2:52 PM

## 2020-02-09 ENCOUNTER — Inpatient Hospital Stay (HOSPITAL_COMMUNITY): Payer: Medicare Other

## 2020-02-09 ENCOUNTER — Inpatient Hospital Stay (HOSPITAL_COMMUNITY): Payer: Medicare Other | Admitting: Occupational Therapy

## 2020-02-09 LAB — GLUCOSE, CAPILLARY
Glucose-Capillary: 205 mg/dL — ABNORMAL HIGH (ref 70–99)
Glucose-Capillary: 248 mg/dL — ABNORMAL HIGH (ref 70–99)
Glucose-Capillary: 203 mg/dL — ABNORMAL HIGH (ref 70–99)
Glucose-Capillary: 274 mg/dL — ABNORMAL HIGH (ref 70–99)

## 2020-02-09 LAB — BASIC METABOLIC PANEL
Anion gap: 11 (ref 5–15)
BUN: 30 mg/dL — ABNORMAL HIGH (ref 8–23)
CO2: 25 mmol/L (ref 22–32)
Calcium: 9.1 mg/dL (ref 8.9–10.3)
Chloride: 100 mmol/L (ref 98–111)
Creatinine, Ser: 1.73 mg/dL — ABNORMAL HIGH (ref 0.44–1.00)
GFR, Estimated: 29 mL/min — ABNORMAL LOW (ref >60)
Glucose, Bld: 222 mg/dL — ABNORMAL HIGH (ref 70–99)
Potassium: 3.8 mmol/L (ref 3.5–5.1)
Sodium: 136 mmol/L (ref 135–145)

## 2020-02-09 LAB — CBC
HCT: 38.7 % (ref 36.0–46.0)
Hemoglobin: 12.4 g/dL (ref 12.0–15.0)
MCH: 29.6 pg (ref 26.0–34.0)
MCHC: 32 g/dL (ref 30.0–36.0)
MCV: 92.4 fL (ref 80.0–100.0)
Platelets: 168 10*3/uL (ref 150–400)
RBC: 4.19 MIL/uL (ref 3.87–5.11)
RDW: 14.9 % (ref 11.5–15.5)
WBC: 5.2 10*3/uL (ref 4.0–10.5)
nRBC: 0 % (ref 0.0–0.2)

## 2020-02-09 MED ORDER — PHENYLEPHRINE HCL-NACL 10-0.9 MG/250ML-% IV SOLN
INTRAVENOUS | Status: AC
  Filled 2020-02-09: qty 500

## 2020-02-09 MED ORDER — ACETAMINOPHEN 10 MG/ML IV SOLN
INTRAVENOUS | Status: AC
  Filled 2020-02-09: qty 200

## 2020-02-09 MED ORDER — TUBERCULIN PPD 5 UNIT/0.1ML ID SOLN
5.0000 [IU] | Freq: Once | INTRADERMAL | Status: AC
  Administered 2020-02-09: 5 [IU] via INTRADERMAL
  Filled 2020-02-09: qty 0.1

## 2020-02-09 MED ORDER — SENNOSIDES-DOCUSATE SODIUM 8.6-50 MG PO TABS
1.0000 | ORAL_TABLET | Freq: Two times a day (BID) | ORAL | Status: DC
  Administered 2020-02-09 – 2020-02-12 (×5): 1 via ORAL
  Filled 2020-02-09 (×6): qty 1

## 2020-02-09 NOTE — Progress Notes (Addendum)
Patient ID: [name] [name], female   DOB: 1932-04-17, 86 y.o.   MRN: [id number] Spoke with [name] [name] to discuss plan for Thursday going to [facility]. Will find out best way for medications so pt will not go without waiting for facility to fill her prescriptions. Working on car transfers today and hope is for pt to go in car with family. TB test ordered and done by RN today will have COVID test on Wed. Getting facility paperwork completed by PA. DME to be delivered to facility-hospital bed and bsc and wheelchair to come to pt's room prior to discharge.

## 2020-02-09 NOTE — Progress Notes (Signed)
Occupational Therapy Weekly Progress Note  Patient Details  Name: [name] [name] MRN: [id number] Date of Birth: 08-Apr-1932  Beginning of progress report period: February 02, 2020 End of progress report period: February 09, 2020  Today's Date: 02/09/2020 OT Individual Time: 1884-1660 OT Individual Time Calculation (min): 65 min    Patient has met 4 of 4 short term goals.  Pt is progressing steadily towards self care independence evidenced by ability to complete LB dressing, toileting compared to prior requiring mod/max assist.  Pt has shown increased functional use of RUE as well.  Pt does still exhibit posterior weight shifting tendency during functional transfers and standing balance requiring mod assist for stand pivots and min assist for sit<>stand transfers. Pt also exhibits RUE weakness and delayed motor processing and control and RLE weakness which limits full independence with ADLs and functional mobility.  Pt plans to dc to assisted living setting where she will have min assist/supervision full time for BADLs and IADLs.  Pt would benefit from further skilled OT to maximize independence to decrease caregiver burden.  Patient continues to demonstrate the following deficits: muscle weakness, decreased cardiorespiratoy endurance, decreased coordination and decreased motor planning and decreased standing balance, hemiplegia and decreased balance strategies and therefore will continue to benefit from skilled OT intervention to enhance overall performance with BADL.  Patient progressing toward long term goals..  Continue plan of care.  OT Short Term Goals Week 3:  OT Short Term Goal 1 (Week 3): Pt will complete LB dressing using AE as needed with min assist OT Short Term Goal 1 - Progress (Week 3): Met OT Short Term Goal 2 (Week 3): Pt will complete sit to stand with min assist OT Short Term Goal 2 - Progress (Week 3): Met OT Short Term Goal 3 (Week 3): Pt will complete toileting with min  assist. OT Short Term Goal 3 - Progress (Week 3): Met OT Short Term Goal 4 (Week 3): Pt will complete toilet transfer with mod assist OT Short Term Goal 4 - Progress (Week 3): Met Week 4:  OT Short Term Goal 1 (Week 4): STGs=LTGs due to ELOS  Skilled Therapeutic Interventions/Progress Updates:    Pt sitting up in recliner, requesting to take a shower.  Pt completed stand pivot transfer recliner to w/c using RW and right hand splint with mod assist to facilitate anterior weight shifting and slow descent when sitting, and mod VCs needed for sequencing of pivot.  Pt self propelled to bathroom with min assist for small space negotiation.  Stand pivot w/c to shower bench with mod assist using grab bars.  Pt doffed shirt and bra with mod I and pants and brief with min assist including sit<>stand using grab bar and RW for balance.  Pt bathed UB and LB using long handled sponge as needed with supervision, not including buttocks.  Pt completed stand pivot shower bench to w/c with mod assist using grab bars and VCs for body mechanics and extremity placement.  Pt transported to sink and donned bra with min assist and shirt with setup.  Pt donned brief and pants with min assist including sit<>stand at sink.  Total assist to donn shoes for time mgt only.  Pt completed stand pivot w/c to recliner with mod assist for slowing descent into chair and VCs for sequencing of foot placement and RW mgt.  Call bell in reach, seat alarm on.   Therapy Documentation Precautions:  Precautions Precautions: Fall Precaution Comments: R-hemi; watch for subluxation Restrictions Weight  Bearing Restrictions: No   Therapy/Group: Individual Therapy  Ezekiel Slocumb 02/09/2020, 3:52 PM

## 2020-02-09 NOTE — Progress Notes (Signed)
Physical Therapy Session Note  Patient Details  Name: [name] [name] MRN: [id number] Date of Birth: September 21, 1932  Today's Date: 02/09/2020 PT Individual Time: 2751-7001 + 7494-4967 PT Individual Time Calculation (min): 59 min  + 54 min  Short Term Goals: Week 3:  PT Short Term Goal 1 (Week 3): Pt will complete bed mobility with no more than minA PT Short Term Goal 2 (Week 3): Pt will complete functional transfers, consistently, with modA and LRAD PT Short Term Goal 3 (Week 3): Pt will ambulate 67ft with maxA and LRAD  Skilled Therapeutic Interventions/Progress Updates:     1st session: Pt greeted seated in recliner, son at bedside, pt agreeable to PT session without reports of pain. Son with some questions regarding upcoming discharge, specifically regarding car transfers. Son reports there are several options for cars to get patient from rehab to her ALF. Most practical choices are their [facility] vs [employer]. [employer] CRV height measured at 30inches, unable to determine their Accord seat height. Pt performed sit>stand with min/modA from recliner and stand>pivot with modA for steadying and cues for stepping pattern to w/c. She was transported with totalA for time management to ortho gym to perform car transfers. She completed x2 car transfers, both requiring modA with similar technique for recliner transfer as outlined above. She completed the 1st car transfer with seat height set to 23inches and then completed a 2nd car transfer with seat height set to 25 inches. Concern for seat height at 30inches for safety and relayed this to her and will also inform her son. Will try to have son present at next session from hands on car transfer training. Pt then propelled herself, ~181ft on level surfaces, with supervision in her w/c using a hemi-technique with LUE/LLE. Noted veering R but she was able to correct. Very slow propelling in chair but capable. She then performed sit>stand from w/c to no AD and  performed Ring Arc with minA for balance and hand-over-hand assist for RUE to rings and over large Arc. Transported back to her room with totalA for time management, performed additional stand>pivot with modA from w/c back to her recliner. She remained seated in recliner with chair alarm on, RUE supported with pillow, and needs in reach.  2nd session: Pt received seated in recliner with Son, Rocky Link, at bedside. Pt agreeable to PT session with no reports of pain. Performed stand>pivot with modA from recliner to w/c with cues for setup, forward scooting, L hand placement, and technique as outlined above. Focus of session to review car transfers with son. Transported patient with totalA to ortho gym. Car height set to 25inches. Therapist performed stand>pivot car transfer with modA with son present for education on technique. Son requesting to perform car transfer with RW and therapist encouraged face-to-face technique instead to avoid RW management and improved guidance/stability with assisting patient. Allowed him to attempt with RW to feel the difference and therapist provided close CGA/supervision for safety. He performed car transfer into car with RW and then realized the difficulty with using the RW. Therefore, he didn't use the RW for returning to the w/c from the car and this was performed with a much safer and better technique, therapist again providing close supervision/CGA for safety.   Son requesting that his siblings also get hands on training for this and was requesting for a "real-life" trial tomorrow in their CRV or Accord. Either the sibling [name] or Link Snuffer will be present for this training tomorrow with the car.  Pt reporting fatigue from busy day of therapies and requesting to return to her room. Son transported her in her w/c and also performed the stand>pivot transfer from w/c to recliner with therapist providing CGA. Educated on safety techniques, guarding, hand/foot placement, and general  sequencing to optimize safety. He demonstrated good technique with this and felt comfortable with the transfer.   She ended session seated in recliner with RUE supported with pillow and chair alarm on. Needs in reach, son at bedside.  Therapy Documentation Precautions:  Precautions Precautions: Fall Precaution Comments: R-hemi; watch for subluxation Restrictions Weight Bearing Restrictions: No  Therapy/Group: Individual Therapy  Everli Rother P Cherron Blitzer PT 02/09/2020, 9:59 AM

## 2020-02-09 NOTE — Discharge Summary (Addendum)
Physician Discharge Summary  Patient ID: [name] [name] MRN: [id number] DOB/AGE: 04/22/1932 86 y.o.  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  Comparison Study: no prior Performing Technologist: Blanch MediaMegan Riddle RVS  Examination Guidelines: A complete evaluation includes B-mode imaging, spectral Doppler, color Doppler, and power Doppler as needed of all accessible portions of each vessel. Bilateral testing is considered an integral part of a complete examination. Limited examinations for reoccurring indications may be performed as noted. The reflux portion of the exam is performed with the patient in reverse Trendelenburg.  +---------+---------------+---------+-----------+----------+--------------+  RIGHT     Compressibility Phasicity Spontaneity Properties Thrombus Aging  +---------+---------------+---------+-----------+----------+--------------+  CFV       Full            Yes       Yes                                    +---------+---------------+---------+-----------+----------+--------------+  SFJ       Full                                                             +---------+---------------+---------+-----------+----------+--------------+  FV Prox   Full                                                             +---------+---------------+---------+-----------+----------+--------------+  FV Mid    Full                                                              +---------+---------------+---------+-----------+----------+--------------+  FV Distal Full                                                             +---------+---------------+---------+-----------+----------+--------------+  PFV       Full                                                             +---------+---------------+---------+-----------+----------+--------------+  POP       Full            Yes       Yes                                    +---------+---------------+---------+-----------+----------+--------------+  PTV       Full                                                             +---------+---------------+---------+-----------+----------+--------------+  PERO      Full                                                             +---------+---------------+---------+-----------+----------+--------------+   +---------+---------------+---------+-----------+----------+--------------+  LEFT      Compressibility Phasicity Spontaneity Properties Thrombus Aging  +---------+---------------+---------+-----------+----------+--------------+  CFV       Full            Yes       Yes                                    +---------+---------------+---------+-----------+----------+--------------+  SFJ       Full                                                             +---------+---------------+---------+-----------+----------+--------------+  FV Prox   Full                                                             +---------+---------------+---------+-----------+----------+--------------+  FV Mid    Full                                                             +---------+---------------+---------+-----------+----------+--------------+  FV Distal Full                                                             +---------+---------------+---------+-----------+----------+--------------+  PFV       Full                                                              +---------+---------------+---------+-----------+----------+--------------+  POP       Full            Yes       Yes                                    +---------+---------------+---------+-----------+----------+--------------+  PTV       Full                                                             +---------+---------------+---------+-----------+----------+--------------+  PERO      Full                                                             +---------+---------------+---------+-----------+----------+--------------+     Summary: BILATERAL: - No evidence of deep vein thrombosis seen in the lower extremities, bilaterally. - No evidence of superficial venous thrombosis in the lower extremities, bilaterally. -No evidence of popliteal cyst, bilaterally.   *See table(s) above for measurements and observations. Electronically signed by Sherald Hess MD on 01/19/2020 at 4:44:11 PM.    Final     Labs:  Basic Metabolic Panel: BMP Latest Ref Rng & Units 02/09/2020 02/02/2020 01/29/2020  Glucose 70 - 99 mg/dL 161(W) 960(A) 540(J)  BUN 8 - 23 mg/dL 81(X) 91(Y) 78(G)  Creatinine 0.44 - 1.00 mg/dL 9.56(O) 1.30(Q) 6.57(Q)  Sodium 135 - 145 mmol/L 136 138 135  Potassium 3.5 - 5.1 mmol/L 3.8 4.0 4.8  Chloride 98 - 111 mmol/L 100 101 98  CO2 22 - 32 mmol/L Calcium 8.9 - 10.3 mg/dL 9.1 9.4 9.4    CBC: CBC Latest Ref Rng & Units 02/09/2020 02/06/2020 02/02/2020  WBC 4.0 - 10.5 K/uL 5.2 5.1 5.3  Hemoglobin 12.0 - 15.0 g/dL 46.9 62.9 52.8  Hematocrit 36.0 - 46.0 % 38.7 38.1 39.9  Platelets 150 - 400 K/uL 168 142(L) 149(L)    CBG: Recent Labs  Lab 02/11/20 0603 02/11/20 1117 02/11/20 1642 02/11/20 2115 02/12/20 0549  GLUCAP 158* 204* 207* 291* 166*    Brief HPI:   [name] [name] is a 86 y.o. female with history of T2DM, CKD, HTN, CHF, morbid obesity-BMI 70; who was admitted on 01/04/2020 after collapsing at church. CT of head done showing acute left thalamic and basal ganglia  hemorrhage with effacement of third ventricle. She was started on Cleviprex and labetalol for BP control and required intubation for airway protection. She tolerated extubation on 11/09 follow-up MRI brain showed no visible mass or vascular lesion, extensive small vessel ischemia and acute left thalamic bleed without significant change. Tube feeds were initiated for nutritional support and IV antibiotics added due to ongoing issues with lethargy, leukocytosis as well as low-grade fevers.  On 11/16 she was noted to have mental status change with significant somnolence and inability to follow commands. CT head showed no acute findings and EEG done due to question of seizure. This was negative for epileptiform discharges. Her mentation continues to fluctuate but was improving with increase in ability to follow therapy. She  was tolerating dysphagia 2 diet with thin liquids without signs of aspiration. Patient with resultant right hemiparesis with right inattention, cognitive deficits, delayed processing as well as expressive deficits affecting ADLs and mobility. CIR was recommended to functional decline.   Hospital Course: [name] [name] was admitted to rehab 01/16/2020 for inpatient therapies to consist of PT, ST and OT at least three hours five days a week. Past admission physiatrist, therapy team and rehab RN have worked together to provide customized collaborative inpatient rehab.  LE dopplers done due to dense hemiplegia/immobility and was negative for DVT. Her blood pressures were monitored on TID basis and is showing better control with titration of medication. Serial check of BMET showed acute on chronic renal failure therefore HCTZ was changed to chlorthalidone and Lasix was decreased to 10 mg a day. Transient hypokalemia has resolved. Recommend encouraging p.o. fluid intake to maintain adequate hydration. Serial check of CBC shows H&H is stable and thrombocytopenia has resolved. Right knee pain has been  a limiting issue and was treated with intra-articular steroid injection on 12/02. Voltaren gel is being used 4 times daily for local measures. Low back pain has been managed with use of lidocaine patch during the day and K pad at nights as needed. Amantadine was added to assist with activation. Slow transit constipation has required adjustment in bowel regimen and Senokot was titrated up to BID with good results.  Her diabetes has been monitored with ac/hs CBG checks and SSI was use prn for tighter BS control. Blood sugars were noted to be labile with multiple hypoglycemic episodes and requiring frequent adjustment initially.  As her activity tolerance and p.o. intake have improved, her blood sugars have started trending up.  Insulin is slowly being titrated upward for tighter control--last increased on 12/14. Seroquel was added at bedtime per family request due to concerns of anxiety and sleep wake disruption. Her anxiety levels are improving and sleep-wake disruption has resolved. Seroquel has been tapered to 12.5 mg as needed at bedtime.  Dr. Rodenbough/neuropsychologist was consulted for evaluation of mood and for support.  Patient denied any significant anxiety or coping issues and was reporting improvement in mood.  She has been making slow steady progress during her stay and is currently at min to mod assist depending on fatigue.  She continues to be limited by right-sided weakness with apraxia and endurance issues.  Family is transitioning patient to assisted living facility at discharge.  She will continue to receive follow-up home health PT, OT and ST after discharge.   Rehab course: During patient's stay in rehab weekly team conferences were held to monitor patient's progress, set goals and discuss barriers to discharge. At admission, patient required max assist with mobility and ADL tasks.  She exhibited mild to moderate oropharyngeal dysphagia and moderate cognitive linguistic deficits with mild to  moderate flaccid dysarthria. She  has had improvement in activity tolerance, balance, postural control as well as ability to compensate for deficits. She has had improvement in functional use RUE  and RLE as well as improvement in awareness.  She requires min assist to complete ADL tasks and for mobility.  She has been advanced to regular textures and is tolerating this without signs of dysphagia.  She requires min assist to supervision for cognitive tasks.  Family education has been completed.     Disposition: Assisted Living Facility.   Diet: Carb Modified.   Special Instructions: 1. Monitor blood sugars before meals and at bedtime. Follow up with PCP  for further titration of insulin after discharge.  2. Encourage patient to drink fluids between meals.  3. Recommend repeat BMET in 5-7 days to monitor renal status.    Discharge Instructions     Ambulatory referral to Neurology   Complete by: As directed    An appointment is requested in approximately: 2-3 weeks   Ambulatory referral to Physical Medicine Rehab   Complete by: As directed    1-2 weeks TC appt      Allergies as of 02/12/2020       Reactions   Amlodipine Other (See Comments)   EDEMA   Ace Inhibitors Cough   Canagliflozin    INVOKANA   Morphine And Related Rash, Other (See Comments)   For long periods of time (??)        Medication List     STOP taking these medications    azelastine 0.1 % nasal spray Commonly known as: ASTELIN   CALCIUM CARBONATE-VITAMIN D PO   hydrochlorothiazide 25 MG tablet Commonly known as: HYDRODIURIL   insulin aspart 100 UNIT/ML injection Commonly known as: novoLOG   loratadine 10 MG tablet Commonly known as: CLARITIN   pravastatin 20 MG tablet Commonly known as: PRAVACHOL       TAKE these medications    acetaminophen 500 MG tablet Commonly known as: TYLENOL Take 1 tablet (500 mg total) by mouth every 6 (six) hours as needed for mild pain.   albuterol 108 (90  Base) MCG/ACT inhaler Commonly known as: VENTOLIN HFA Inhale 2 puffs into the lungs every 6 (six) hours as needed for wheezing or shortness of breath.   amantadine 100 MG capsule Commonly known as: SYMMETREL Take 1 capsule (100 mg total) by mouth daily.   chlorthalidone 25 MG tablet Commonly known as: HYGROTON Take 1.5 tablets (37.5 mg total) by mouth daily.   diclofenac sodium 1 % Gel Commonly known as: VOLTAREN Apply 4 g topically 4 (four) times daily. What changed:  when to take this reasons to take this   furosemide 20 MG tablet Commonly known as: LASIX Take 0.5 tablets (10 mg total) by mouth See admin instructions. What changed:  how much to take additional instructions   hydrALAZINE 100 MG tablet Commonly known as: APRESOLINE Take 1 tablet (100 mg total) by mouth 3 (three) times daily.   insulin glargine 100 UNIT/ML injection Commonly known as: LANTUS Inject 0.4 mLs (40 Units total) into the skin 2 (two) times daily. What changed: how much to take   Insulin Syringe-Needle U-100 31G X 5/16" 1 ML Misc 1 application by Does not apply route 2 (two) times daily.   lidocaine 5 % Commonly known as: LIDODERM Apply to lower back at 8 am and remove at 8 pm daily.   metoprolol tartrate 100 MG tablet Commonly known as: LOPRESSOR Take 1 tablet (100 mg total) by mouth 2 (two) times daily.   pantoprazole 40 MG tablet Commonly known as: Protonix Take 1 tablet (40 mg total) by mouth daily.   polyethylene glycol 17 g packet Commonly known as: MIRALAX / GLYCOLAX Take 17 g by mouth 2 (two) times daily. What changed:  how to take this when to take this   QUEtiapine 25 MG tablet Commonly known as: SEROQUEL Take 0.5 tablets (12.5 mg total) by mouth at bedtime as needed (anxiety).   senna-docusate 8.6-50 MG tablet Commonly known as: Senokot-S Take 1 tablet by mouth 2 (two) times daily.        Follow-up Information  [name], MD Follow up.   Specialty:  Family Medicine Why: Make follow up appointment Contact information: [address] [facility] [place] [address] [phone]         Marcello Fennel, MD Follow up.   Specialty: Physical Medicine and Rehabilitation Why: Office will call you with follow up appointment Contact information: [address] [place] [address] [phone]         [facility]. Call.   Why: for post stroke appointment Contact information: [address]     [address] [place] [address] [phone]                Signed: Jacquelynn Cree 02/12/2020, 9:02 AM Patient was seen, face-face, and physical exam performed by me on day of discharge, greater than 30 minutes of total time spent.. Please see progress note from day of discharge as well.  Maryla Morrow, MD, ABPMR

## 2020-02-09 NOTE — Progress Notes (Signed)
PPD skin test given in left fore arm @1216 . Will read in 48 hours.   

## 2020-02-09 NOTE — Progress Notes (Signed)
PHYSICAL MEDICINE & REHABILITATION PROGRESS NOTE   Subjective/Complaints: Patient seen sitting up in a chair this morning.  She states she is overloaded.  She stated weekend.  Son at bedside.  ROS: Denies CP, SOB, N/V/D  Objective:   No results found. Recent Labs    02/09/20 0519  WBC 5.2  HGB 12.4  HCT 38.7  PLT 168   Recent Labs    02/09/20 0519  NA 136  K 3.8  CL 100  CO2 25  GLUCOSE 222*  BUN 30*  CREATININE 1.73*  CALCIUM 9.1    Intake/Output Summary (Last 24 hours) at 02/09/2020 1656 Last data filed at 02/09/2020 1300 Gross per 24 hour  Intake 896 ml  Output --  Net 896 ml        Physical Exam: Vital Signs Blood pressure (!) 160/48, pulse (!) 57, temperature (!) 97.3 F (36.3 C), temperature source Oral, resp. rate 18, height 5\' 3"  (1.6 m), weight 101.3 kg, SpO2 99 %.  Constitutional: No distress . Vital signs reviewed. HENT: Normocephalic.  Atraumatic. Eyes: EOMI. No discharge. Cardiovascular: No JVD.  RRR. Respiratory: Normal effort.  No stridor.  Bilateral clear to auscultation. GI: Non-distended.  BS +. Skin: Warm and dry.  Intact. Psych: Normal mood.  Normal behavior. Musc: No edema in extremities.  No tenderness in extremities. Neuro: Alert Motor: RUE: 2+/5 proximal to distal with apraxia, improving RLE: 2+ to 3 -/5 HF, KE, 2-/5 ADF with apraxia, improving  Assessment/Plan: 1. Functional deficits which require 3+ hours per day of interdisciplinary therapy in a comprehensive inpatient rehab setting.  Physiatrist is providing close team supervision and 24 hour management of active medical problems listed below.  Physiatrist and rehab team continue to assess barriers to discharge/monitor patient progress toward functional and medical goals  Care Tool:  Bathing    Body parts bathed by patient: Chest,Abdomen,Right upper leg,Left upper leg,Face,Right arm,Left arm,Right lower leg,Left lower leg,Front perineal area   Body parts  bathed by helper: Right arm,Chest,Abdomen,Right upper leg,Left upper leg,Right lower leg,Left lower leg,Face,Left arm,Front perineal area Body parts n/a: Buttocks   Bathing assist Assist Level: Supervision/Verbal cueing     Upper Body Dressing/Undressing Upper body dressing   What is the patient wearing?: Bra,Pull over shirt    Upper body assist Assist Level: Minimal Assistance - Patient > 75%    Lower Body Dressing/Undressing Lower body dressing      What is the patient wearing?: Underwear/pull up,Pants     Lower body assist Assist for lower body dressing: Minimal Assistance - Patient > 75%     Toileting Toileting Toileting Activity did not occur (Clothing management and hygiene only): N/A (no void or bm)  Toileting assist Assist for toileting: Moderate Assistance - Patient 50 - 74% (using stedy to stand for clothing mgt and pericare)     Transfers Chair/bed transfer  Transfers assist     Chair/bed transfer assist level: Moderate Assistance - Patient 50 - 74%     Locomotion Ambulation   Ambulation assist   Ambulation activity did not occur: Safety/medical concerns  Assist level: 2 helpers (chair follow for safety) Assistive device: Walker-rolling Max distance: 70ft   Walk 10 feet activity   Assist  Walk 10 feet activity did not occur: Safety/medical concerns  Assist level: 2 helpers (chair follow for safety) Assistive device: Walker-rolling   Walk 50 feet activity   Assist Walk 50 feet with 2 turns activity did not occur: Safety/medical concerns  Walk 150 feet activity   Assist Walk 150 feet activity did not occur: Safety/medical concerns         Walk 10 feet on uneven surface  activity   Assist Walk 10 feet on uneven surfaces activity did not occur: Safety/medical concerns         Wheelchair     Assist Will patient use wheelchair at discharge?: Yes Type of Wheelchair: Manual    Wheelchair assist level:  Supervision/Verbal cueing,Set up assist Max wheelchair distance: 135ft    Wheelchair 50 feet with 2 turns activity    Assist        Assist Level: Supervision/Verbal cueing   Wheelchair 150 feet activity     Assist      Assist Level: Supervision/Verbal cueing   Medical Problem List and Plan: 1. 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  Continue CIR  WHO/PRAFO nightly 2. Antithrombotics: -DVT/anticoagulation:Pharmaceutical:Heparin -antiplatelet therapy: N/a 3. Pain Management:Tylenol as needed.   Continue Voltaren gel  Right knee intra-articular steroid injection (supposed to have knee replacement in 02/2020) performed on 12/2  continue kpad and lidocaine patch for lower back with good results.   Controlled with meds on 12/13 4. Mood:LCSW to follow for evaluation and support when appropriate. -antipsychotic agents: N/A  Seroquel prn HS 25mg  per son.   12/12: discussed with son weaning seroquel to 12.5mg  and he is agreeable.   Nursing order for lavender drops on forehead at night.   Amantadine 50 started on 12/1 with improvement 5. Neuropsych: This patientis notcapable of making decisions on herown behalf. 6. Skin/Wound Care:Routine pressure-relief measures. Maintain adequate nutrition and hydration status. 7. Fluids/Electrolytes/Nutrition:Monitor intake/output--  Will need assistance with feeding as due to fluctuating bouts of lethargy.  8. HTN: Monitor BP --continue Lasix, Hydralazine, Metoprolol  HCTZ changed to chlorthalidone on 11/25 , increased on 12/6            Vitals:   02/09/20 0452 02/09/20 1444  BP: (!) 131/48 (!) 160/48  Pulse: (!) 59 (!) 57  Resp: 18 18  Temp: 98.4 F (36.9 C) (!) 97.3 F (36.3 C)  SpO2:  99% 99%   Other level on 12/13, continue to monitor trend 9. Acute on chronic kidney injury: BUN/SCr 34/1.5 at admission.  Decreased lasix to 10mg   Creatinine 1.73 on 12/13  Encourage fluids 10. PNA?/Tracheobronchitis: Completed course of antibiotics on  11. Leukocytosis: Resolved   12. T2DM with hyperglycemia:  Monitor BS ac/hs.   CBG (last 3)  Recent Labs    02/09/20 0627 02/09/20 1124 02/09/20 1622  GLUCAP 205* 248* 203*   Lantus decreased to 48U, decreased to 35 twice daily on 11/23, decreased to 28 on 11/24, increased to 30 twice daily on 11/30, increase to 34 twice daily on 12/2, increased to 36 twice daily on 12/8, increased to 38 on 12/11  Remains elevated on 12/13, will consider further adjustments tomorrow if appropriate 13. Post stroke dysphagia :             Advance to regular thins with intermittent supervision, continue to advance diet as tolerated. 14.  Hyperkalemia  Potassium 4.0 on 12/6 15.  Slow transit constipation  Bowel meds increased on 11/28  Improving 16. Sleep disturbance  AM lethargy with Trazodone, d/ced  Improved.  17.  Right knee OA  See #3 18.  Thrombocytopenia  Platelets 168 on 12/13  Continue to monitor  19.  Slow transit constipation:   Bowel meds increased on 12/13  LOS: 24 days A FACE TO FACE EVALUATION WAS PERFORMED  [name] [name] 02/09/2020, 4:56 PM

## 2020-02-09 NOTE — Progress Notes (Signed)
Speech Language Pathology Daily Session Note  Patient Details  Name: [name] [name] MRN: [id number] Date of Birth: April 07, 1932  Today's Date: 02/09/2020 SLP Individual Time: 3810-1751 SLP Individual Time Calculation (min): 25 min  Short Term Goals: Week 4: SLP Short Term Goal 1 (Week 4): STG's=LTG's (ELOS: d/c anticipated 12/16)  Skilled Therapeutic Interventions:Skilled ST services focused on education and speech skills. SLP facilitated use of EMST set at 8cm H2O, pt demonstrated ability to preform 7 out 10 with appropriate strength, then completing 3 with appropriate strength. Pt demonstrated mod I recall of breathing exercises. SLP educated pt and pt's son how to adjust resistance and how to judge the need for increase/decrease. All questions answered to satisfaction. Pt complete then completed another set with 10/10 use of appropriate strength. Pt self rated effort level 7 out 10. Pt required min fade to supervision A verbal cues for speak intelligibility, due to misarticulation.  Pt was left in room with son, call bell within reach and chair alarm set. SLP recommends to continue skilled services.     Pain Pain Assessment Pain Score: 0-No pain  Therapy/Group: Individual Therapy  Meryle Pugmire  [facility] 02/09/2020, 3:51 PM

## 2020-02-10 ENCOUNTER — Inpatient Hospital Stay (HOSPITAL_COMMUNITY): Payer: Medicare Other | Admitting: Physical Therapy

## 2020-02-10 ENCOUNTER — Inpatient Hospital Stay (HOSPITAL_COMMUNITY): Payer: Medicare Other | Admitting: Occupational Therapy

## 2020-02-10 ENCOUNTER — Inpatient Hospital Stay (HOSPITAL_COMMUNITY): Payer: Medicare Other

## 2020-02-10 LAB — GLUCOSE, CAPILLARY
Glucose-Capillary: 207 mg/dL — ABNORMAL HIGH (ref 70–99)
Glucose-Capillary: 327 mg/dL — ABNORMAL HIGH (ref 70–99)
Glucose-Capillary: 284 mg/dL — ABNORMAL HIGH (ref 70–99)
Glucose-Capillary: 175 mg/dL — ABNORMAL HIGH (ref 70–99)
Glucose-Capillary: 247 mg/dL — ABNORMAL HIGH (ref 70–99)

## 2020-02-10 MED ORDER — PANTOPRAZOLE SODIUM 40 MG PO TBEC: 40.0000 mg | DELAYED_RELEASE_TABLET | Freq: Every day | ORAL | 0 refills | Status: DC

## 2020-02-10 MED ORDER — AMANTADINE HCL 100 MG PO CAPS: 100.0000 mg | ORAL_CAPSULE | Freq: Every day | ORAL | 0 refills | Status: DC

## 2020-02-10 MED ORDER — DICLOFENAC SODIUM 1 % TD GEL: 4.0000 g | Freq: Four times a day (QID) | TRANSDERMAL | 0 refills | Status: DC

## 2020-02-10 MED ORDER — METOPROLOL TARTRATE 100 MG PO TABS: 100.0000 mg | ORAL_TABLET | Freq: Two times a day (BID) | ORAL | 0 refills | Status: DC

## 2020-02-10 MED ORDER — FUROSEMIDE 20 MG PO TABS: 10.0000 mg | ORAL_TABLET | ORAL | 0 refills | Status: DC

## 2020-02-10 MED ORDER — SENNOSIDES-DOCUSATE SODIUM 8.6-50 MG PO TABS: 1.0000 | ORAL_TABLET | Freq: Two times a day (BID) | ORAL | 0 refills | Status: DC

## 2020-02-10 MED ORDER — "INSULIN SYRINGE-NEEDLE U-100 31G X 5/16"" 1 ML MISC": 1.0000 "application " | Freq: Two times a day (BID) | 0 refills | Status: DC

## 2020-02-10 MED ORDER — POLYETHYLENE GLYCOL 3350 17 G PO PACK: 17.0000 g | PACK | Freq: Two times a day (BID) | ORAL | 0 refills | Status: DC

## 2020-02-10 MED ORDER — ALBUTEROL SULFATE HFA 108 (90 BASE) MCG/ACT IN AERS: 2.0000 | INHALATION_SPRAY | Freq: Four times a day (QID) | RESPIRATORY_TRACT | 0 refills | Status: DC | PRN

## 2020-02-10 MED ORDER — INSULIN GLARGINE 100 UNIT/ML ~~LOC~~ SOLN: 40.0000 [IU] | Freq: Two times a day (BID) | SUBCUTANEOUS | 11 refills | Status: DC

## 2020-02-10 MED ORDER — ALBUTEROL SULFATE HFA 108 (90 BASE) MCG/ACT IN AERS
2.0000 | INHALATION_SPRAY | Freq: Four times a day (QID) | RESPIRATORY_TRACT | 0 refills | Status: DC | PRN
Start: 2020-02-10 — End: 2020-02-10

## 2020-02-10 MED ORDER — QUETIAPINE FUMARATE 25 MG PO TABS: 12.5000 mg | ORAL_TABLET | Freq: Every evening | ORAL | 0 refills | Status: DC | PRN

## 2020-02-10 MED ORDER — LIDOCAINE 5 % EX PTCH: 1.0000 | MEDICATED_PATCH | Freq: Every day | CUTANEOUS | 0 refills | Status: DC

## 2020-02-10 MED ORDER — CHLORTHALIDONE 25 MG PO TABS: 37.5000 mg | ORAL_TABLET | Freq: Every day | ORAL | 0 refills | Status: DC

## 2020-02-10 MED ORDER — LIDOCAINE 5 % EX PTCH: MEDICATED_PATCH | CUTANEOUS | 0 refills | Status: DC

## 2020-02-10 MED ORDER — INSULIN GLARGINE 100 UNIT/ML ~~LOC~~ SOLN
40.0000 [IU] | Freq: Two times a day (BID) | SUBCUTANEOUS | Status: DC
  Administered 2020-02-10 – 2020-02-12 (×4): 40 [IU] via SUBCUTANEOUS
  Filled 2020-02-10 (×5): qty 0.4

## 2020-02-10 MED ORDER — HYDRALAZINE HCL 100 MG PO TABS: 100.0000 mg | ORAL_TABLET | Freq: Three times a day (TID) | ORAL | 0 refills | Status: DC

## 2020-02-10 MED ORDER — FUROSEMIDE 20 MG PO TABS
10.0000 mg | ORAL_TABLET | ORAL | 0 refills | Status: DC
Start: 2020-02-10 — End: 2020-02-10

## 2020-02-10 MED ORDER — ACETAMINOPHEN 500 MG PO TABS: 500.0000 mg | ORAL_TABLET | Freq: Four times a day (QID) | ORAL | 0 refills | Status: DC | PRN

## 2020-02-10 NOTE — Progress Notes (Signed)
[facility] PHYSICAL MEDICINE & REHABILITATION PROGRESS NOTE   Subjective/Complaints: Patient seen sitting up with therapies.  Daughter at bedside.  She states she slept well overnight.  Daughter is questions regarding follow-up medications and appointments.  She notes improvement with ankle dorsiflexion.  ROS: Denies CP, SOB, N/V/D  Objective:   No results found. Recent Labs    02/09/20 0519  WBC 5.2  HGB 12.4  HCT 38.7  PLT 168   Recent Labs    02/09/20 0519  NA 136  K 3.8  CL 100  CO2 25  GLUCOSE 222*  BUN 30*  CREATININE 1.73*  CALCIUM 9.1    Intake/Output Summary (Last 24 hours) at 02/10/2020 1115 Last data filed at 02/10/2020 6226 Gross per 24 hour  Intake 593 ml  Output --  Net 593 ml        Physical Exam: Vital Signs Blood pressure (!) 153/55, pulse 61, temperature 97.6 F (36.4 C), temperature source Oral, resp. rate 18, height 5\' 3"  (1.6 m), weight 101.3 kg, SpO2 96 %.  Constitutional: No distress . Vital signs reviewed. HENT: Normocephalic.  Atraumatic. Eyes: EOMI. No discharge. Cardiovascular: No JVD.  RRR. Respiratory: Normal effort.  No stridor.  Bilateral clear to auscultation. GI: Non-distended.  BS +. Skin: Warm and dry.  Intact. Psych: Normal mood.  Normal behavior. Musc: No edema in extremities.  No tenderness in extremities. Neuro: Alert Motor: RUE: 3-3/5 proximal to distal with apraxia RLE: 2+ to 3 -/5 HF, KE, 2+/5 ADF with apraxia  Assessment/Plan: 1. Functional deficits which require 3+ hours per day of interdisciplinary therapy in a comprehensive inpatient rehab setting.  Physiatrist is providing close team supervision and 24 hour management of active medical problems listed below.  Physiatrist and rehab team continue to assess barriers to discharge/monitor patient progress toward functional and medical goals  Care Tool:  Bathing    Body parts bathed by patient: Chest,Abdomen,Right upper leg,Left upper leg,Face,Right  arm,Left arm,Right lower leg,Left lower leg,Front perineal area   Body parts bathed by helper: Right arm,Chest,Abdomen,Right upper leg,Left upper leg,Right lower leg,Left lower leg,Face,Left arm,Front perineal area Body parts n/a: Buttocks   Bathing assist Assist Level: Supervision/Verbal cueing     Upper Body Dressing/Undressing Upper body dressing   What is the patient wearing?: Bra,Pull over shirt    Upper body assist Assist Level: Minimal Assistance - Patient > 75%    Lower Body Dressing/Undressing Lower body dressing      What is the patient wearing?: Underwear/pull up,Pants     Lower body assist Assist for lower body dressing: Minimal Assistance - Patient > 75%     Toileting Toileting Toileting Activity did not occur (Clothing management and hygiene only): N/A (no void or bm)  Toileting assist Assist for toileting: Moderate Assistance - Patient 50 - 74% (using stedy to stand for clothing mgt and pericare)     Transfers Chair/bed transfer  Transfers assist     Chair/bed transfer assist level: Moderate Assistance - Patient 50 - 74%     Locomotion Ambulation   Ambulation assist   Ambulation activity did not occur: Safety/medical concerns  Assist level: 2 helpers (chair follow) Assistive device: Walker-rolling Max distance: 67ft   Walk 10 feet activity   Assist  Walk 10 feet activity did not occur: Safety/medical concerns  Assist level: 2 helpers (chair follow) Assistive device: Walker-rolling   Walk 50 feet activity   Assist Walk 50 feet with 2 turns activity did not occur: Safety/medical concerns  Walk 150 feet activity   Assist Walk 150 feet activity did not occur: Safety/medical concerns         Walk 10 feet on uneven surface  activity   Assist Walk 10 feet on uneven surfaces activity did not occur: Safety/medical concerns         Wheelchair     Assist Will patient use wheelchair at discharge?: Yes Type of  Wheelchair: Manual    Wheelchair assist level: Supervision/Verbal cueing,Set up assist Max wheelchair distance: 155ft    Wheelchair 50 feet with 2 turns activity    Assist        Assist Level: Supervision/Verbal cueing   Wheelchair 150 feet activity     Assist      Assist Level: Supervision/Verbal cueing   Medical Problem List and Plan: 1. 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  Continue CIR  WHO/PRAFO nightly 2. Antithrombotics: -DVT/anticoagulation:Pharmaceutical:Heparin -antiplatelet therapy: N/a 3. Pain Management:Tylenol as needed.   Continue Voltaren gel  Right knee intra-articular steroid injection (supposed to have knee replacement in 02/2020) performed on 12/2  continue kpad and lidocaine patch for lower back with good results.   Controlled with meds on 12/14 4. Mood:LCSW to follow for evaluation and support when appropriate. -antipsychotic agents: N/A  Seroquel prn HS 25mg  per son.   12/12: discussed with son weaning seroquel to 12.5mg  and he is agreeable.   Nursing order for lavender drops on forehead at night.   Amantadine 100 daily on 12/10 5. Neuropsych: This patientis notcapable of making decisions on herown behalf. 6. Skin/Wound Care:Routine pressure-relief measures. Maintain adequate nutrition and hydration status. 7. Fluids/Electrolytes/Nutrition:Monitor intake/output--  Will need assistance with feeding as due to fluctuating bouts of lethargy.  8. HTN: Monitor BP --continue Lasix, Hydralazine, Metoprolol  HCTZ changed to chlorthalidone on 11/25 , increased on 12/6            Vitals:   02/10/20 0930 02/10/20 1030  BP: (!) 127/51 (!) 153/55  Pulse: (!) 58 61  Resp:  18  Temp:    SpO2:      Slightly labile on 12/14, continue to monitor trend 9. Acute on chronic kidney injury: BUN/SCr 34/1.5 at admission.  Decreased lasix to 10mg   Creatinine 1.73 on 12/13  Encourage fluids 10. PNA?/Tracheobronchitis: Completed course of antibiotics on  11. Leukocytosis: Resolved   12. T2DM with hyperglycemia:  Monitor BS ac/hs.   CBG (last 3)  Recent Labs    02/09/20 2129 02/10/20 0555 02/10/20 1026  GLUCAP 274* 207* 327*   Lantus decreased to 48U, decreased to 35 twice daily on 11/23, decreased to 28 on 11/24, increased to 30 twice daily on 11/30, increase to 34 twice daily on 12/2, increased to 36 twice daily on 12/8, increased to 38 on 12/11, increased to 40 on 12/14 13. Post stroke dysphagia :             Advance to regular thins with intermittent supervision, continue to advance diet as tolerated. 14.  Hyperkalemia  Potassium 3.8 on 12/13 15.  Slow transit constipation  Bowel meds increased on 11/28, increased on 12/13  Improving 16. Sleep disturbance  AM lethargy with Trazodone, d/ced  Improved.  17.  Right knee OA  See #3 18.  Thrombocytopenia  Platelets 168 on 12/13  Continue to monitor   LOS: 25 days A  FACE TO FACE EVALUATION WAS PERFORMED  Semaja Lymon Karis Juba 02/10/2020, 11:15 AM

## 2020-02-10 NOTE — Progress Notes (Signed)
Occupational Therapy Discharge Summary  Patient Details  Name: [name] [name] MRN: [id number] Date of Birth: 02-18-1933   Patient has met 8 of 8 long term goals due to improved activity tolerance, improved balance, postural control, ability to compensate for deficits, functional use of  RIGHT upper and RIGHT lower extremity, improved attention, improved awareness and improved coordination.  Patient to discharge at overall [place] level.  Patient's care givers at assisted living to provide the necessary physical and cognitive assistance at discharge.    Reasons goals not met: n/a Recommendation:  Patient will benefit from ongoing skilled OT services in home health setting to continue to advance functional skills in the area of BADL, functional transfers, and neuro re-ed of RUE.  Equipment: w/c  Reasons for discharge: treatment goals met and discharge from hospital  Patient/family agrees with progress made and goals achieved: Yes  OT Discharge Precautions/Restrictions  Precautions Precautions: Fall Precaution Comments: R-hemi Restrictions Weight Bearing Restrictions: No Pain Pain Assessment Pain Scale: 0-10 Pain Score: 0-No pain ADL ADL Equipment Provided: Reacher,Long-handled sponge Eating: Set up Where Assessed-Eating: Chair Grooming: Supervision/safety Where Assessed-Grooming: Sitting at sink Upper Body Bathing: Supervision/safety Where Assessed-Upper Body Bathing: Shower Lower Body Bathing: supervision Where Assessed-Lower Body Bathing: Shower Upper Body Dressing: Setup Where Assessed-Upper Body Dressing: Wheelchair Lower Body Dressing: Minimal assistance Where Assessed-Lower Body Dressing: Wheelchair Toileting: Minimal assistance Where Assessed-Toileting: Glass blower/designer: Field seismologist Method: Arts development officer: Child psychotherapist: Network engineer bench,Walk in Facilities manager Transfer:  Dance movement psychotherapist Method: Radiographer, therapeutic: Grab bars Vision Baseline Vision/History: No visual deficits Wears Glasses: Reading only Patient Visual Report: No change from baseline Vision Assessment?: No apparent visual deficits Perception  Perception: Within Functional Limits Praxis Praxis: Impaired Praxis Impairment Details: Motor planning Praxis-Other Comments: much improved since initial evaluation Cognition Overall Cognitive Status: Impaired/Different from baseline Arousal/Alertness: Awake/alert Orientation Level: Oriented X4 Sustained Attention: Appears intact Memory: Appears intact Awareness: Appears intact Problem Solving: Impaired Safety/Judgment: Impaired Sensation Sensation Light Touch: Impaired by gross assessment Central sensation comments: Able to discern light touch but reports diminished sensation on RLE Light Touch Impaired Details: Impaired RLE Hot/Cold: Appears Intact Proprioception: Impaired by gross assessment Proprioception Impaired Details: Impaired RLE;Impaired RUE Stereognosis: Not tested Coordination Gross Motor Movements are Fluid and Coordinated: No Fine Motor Movements are Fluid and Coordinated: No Coordination and Movement Description: R hemiparesis Finger Nose Finger Test: unable to complete with RUE Motor  Motor Motor: Hemiplegia;Abnormal tone;Ataxia;Motor apraxia;Abnormal postural alignment and control Motor - Discharge Observations: R hemiparesis with tone in RUE. Limited to no pushing or R lean in sitting or standing, much improved since evaluation Mobility  Bed Mobility Bed Mobility: Rolling Right;Rolling Left;Supine to Sit;Sit to Supine;Sitting - Scoot to [employer] of Bed Rolling Right: Minimal Assistance - Patient > 75% Rolling Left: Minimal Assistance - Patient > 75% Supine to Sit: Minimal Assistance - Patient > 75% Sitting - Scoot to [employer] of Bed: Moderate Assistance - Patient 50-74% Sit to  Supine: Minimal Assistance - Patient > 75% Transfers Sit to Stand: Minimal Assistance - Patient > 75% Stand to Sit: Minimal Assistance - Patient > 75%  Trunk/Postural Assessment  Cervical Assessment Cervical Assessment: Exceptions to [facility] (forward head) Thoracic Assessment Thoracic Assessment: Exceptions to [facility] (rounded shoulders) Lumbar Assessment Lumbar Assessment: Exceptions to [facility] (posterior pelvic tilt) Postural Control Postural Control: Deficits on evaluation Head Control: decreases with fatigue Trunk Control: Benefits from multi-modal cueing for corrections,  especially in standing  Balance Balance Balance Assessed: Yes Static Sitting Balance Static Sitting - Balance Support: Feet supported;No upper extremity supported Static Sitting - Level of Assistance: 7: Independent Dynamic Sitting Balance Dynamic Sitting - Balance Support: Feet supported;During functional activity Dynamic Sitting - Level of Assistance: 5: Stand by assistance Static Standing Balance Static Standing - Balance Support: No upper extremity supported Static Standing - Level of Assistance: 4: Min assist Dynamic Standing Balance Dynamic Standing - Balance Support: During functional activity Dynamic Standing - Level of Assistance: 3: Mod assist Extremity/Trunk Assessment RUE Assessment RUE Assessment: Exceptions to [facility] - pt now has 100 degrees of elbow flexion, -40 elbow extension, 40 wrist extension, grasp 3/5, shoulder abd 20, sh flex 20 - pt is using RUE as an active assist with ADLs LUE Assessment LUE Assessment: Within Functional Limits   Ezekiel Slocumb 02/10/2020, 4:39 PM

## 2020-02-10 NOTE — Progress Notes (Signed)
Occupational Therapy Session Note  Patient Details  Name: [name] [name] MRN: [id number] Date of Birth: [birth year]  Today's Date: 02/10/2020 OT Individual Time: 1400-1500 OT Individual Time Calculation (min): 60 min    Short Term Goals: Week 4:  OT Short Term Goal 1 (Week 4): STGs=LTGs due to ELOS  Skilled Therapeutic Interventions/Progress Updates:    Pt sitting up in recliner with daughter present.  Daughter very concerned with w/c provided for d/c due to worried that pt wont be comfortable due to seat seeming too shallow to her.  Pt completed stand pivot recliner to w/c using RW and right walker hand splint with mod assist due to premature descent requiring manual facilitation to slow and control stand to sit at w/c.    OT assessed w/c fit and pt positioning.  OT noted w/c appropriate for pt regarding depth and width without back cushion added.  Pt reporting she is concerned she wont be able to propel w/c using lower extremities due to height of seat.  OT measured back-of-knee to floor distance (17 inches) and noted pt would benefit from dropping w/c to lower position to facilitate possible improved functional w/c propulsion.  Pts daughter insisting that she push her mom around to "test it out and make sure she is comfortable".  Daughter transported pt to ADL suite and answered phone to collaborate with her brother over speaker phone.  Educated pts son regarding OT assessment of pt positioning in current w/c with appropriate depth and width and tentative plan to lower chair to trial propulsion at next session.    Pt agreeable to return to room and requesting to "work on my arm".  Pt completed right shoulder FF and Abd AAROM and elbow flex/ext in gravity eliminated plane, and elbow flexion against gravity.  Pt noted with WNL elbow flexion against gravity today x 10 reps.  Pt requesting back to recliner at end of session; pt left with nurse tech to assist with transfer at OT departure.    Therapy Documentation Precautions:  Precautions Precautions: Fall Precaution Comments: R-hemi Restrictions Weight Bearing Restrictions: No   Therapy/Group: Individual Therapy  Amie Critchley 02/10/2020, 4:24 PM

## 2020-02-10 NOTE — Progress Notes (Deleted)
Occupational Therapy Discharge Summary  Patient Details  Name: LEVETA WAHAB MRN: [id number] Date of Birth: 07-Jan-1933  Today's Date: 02/10/2020 OT Individual Time:  OT Individual Time Calculation (min):     Patient has met 6 of 8 long term goals due to improved activity tolerance, improved balance, postural control, ability to compensate for deficits, functional use of  RIGHT upper and RIGHT lower extremity, improved attention, improved awareness and improved coordination.  Patient to discharge at [facility] Assist to [facility] Assist level.  Patient's caregiver at assisted living to provide the necessary physical and cognitive assistance at discharge.    Reasons goals not met: Pt requires mod assist for stand pivot functional toilet and shower bench transfer due to impaired dynamic standing balance and tendency to shift weight posteriorly.    Recommendation:  Patient will benefit from ongoing skilled OT services in home health setting to continue to advance functional skills in the area of BADL , functional transfers including stand pivot using RW, and neuro re-ed of RUE.    Equipment: w/c  Reasons for discharge: treatment goals met and discharge from hospital  Patient/family agrees with progress made and goals achieved: Yes  OT Discharge Precautions/Restrictions  Precautions Precautions: Fall Precaution Comments: R-hemi Restrictions Weight Bearing Restrictions: No Pain Pain Assessment Pain Scale: 0-10 Pain Score: 0-No pain ADL ADL Equipment Provided: Reacher,Long-handled sponge Eating: Set up Where Assessed-Eating: Chair Grooming: Supervision/safety Where Assessed-Grooming: Sitting at sink Upper Body Bathing: Supervision/safety Where Assessed-Upper Body Bathing: Shower Lower Body Bathing: Minimal assistance Where Assessed-Lower Body Bathing: Shower Upper Body Dressing: Setup Where Assessed-Upper Body Dressing: Wheelchair Lower Body Dressing: Minimal assistance Where  Assessed-Lower Body Dressing: Wheelchair Toileting: Minimal assistance Where Assessed-Toileting: Glass blower/designer: Moderate assistance Toilet Transfer Method: Stand pivot Science writer: Child psychotherapist: Network engineer bench,Walk in Retail buyer: Moderate assistance Social research officer, government Method: Radiographer, therapeutic: Grab bars Vision Baseline Vision/History: No visual deficits Wears Glasses: Reading only Patient Visual Report: No change from baseline Vision Assessment?: No apparent visual deficits Perception  Perception: Within Functional Limits Praxis Praxis: Impaired Praxis Impairment Details: Motor planning Praxis-Other Comments: much improved since initial evaluation Cognition Overall Cognitive Status: Impaired/Different from baseline Arousal/Alertness: Awake/alert Orientation Level: Oriented X4 Sustained Attention: Appears intact Memory: Appears intact Awareness: Appears intact Problem Solving: Impaired Safety/Judgment: Impaired Sensation Sensation Light Touch: Impaired by gross assessment Central sensation comments: Able to discern light touch but reports diminished sensation on RLE Light Touch Impaired Details: Impaired RLE Hot/Cold: Appears Intact Proprioception: Impaired by gross assessment Proprioception Impaired Details: Impaired RLE;Impaired RUE Stereognosis: Not tested Coordination Gross Motor Movements are Fluid and Coordinated: No Fine Motor Movements are Fluid and Coordinated: No Coordination and Movement Description: R hemiparesis Finger Nose Finger Test: unable to complete with RUE Motor  Motor Motor: Hemiplegia;Abnormal tone;Ataxia;Motor apraxia;Abnormal postural alignment and control Motor - Discharge Observations: R hemiparesis with tone in RUE. Limited to no pushing or R lean in sitting or standing, much improved since evaluation Mobility  Bed Mobility Bed Mobility: Rolling  Right;Rolling Left;Supine to Sit;Sit to Supine;Sitting - Scoot to Edge of Bed Rolling Right: Minimal Assistance - Patient > 75% Rolling Left: Minimal Assistance - Patient > 75% Supine to Sit: Minimal Assistance - Patient > 75% Sitting - Scoot to Edge of Bed: Moderate Assistance - Patient 50-74% Sit to Supine: Minimal Assistance - Patient > 75% Transfers Sit to Stand: Minimal Assistance - Patient > 75% Stand to Sit: Minimal Assistance - Patient > 75%  Trunk/Postural Assessment  Cervical Assessment Cervical Assessment: Exceptions to [facility] (forward head) Thoracic Assessment Thoracic Assessment: Exceptions to [facility] (rounded shoulders) Lumbar Assessment Lumbar Assessment: Exceptions to [facility] (posterior pelvic tilt) Postural Control Postural Control: Deficits on evaluation Head Control: decreases with fatigue Trunk Control: Benefits from multi-modal cueing for corrections, especially in standing  Balance Balance Balance Assessed: Yes Static Sitting Balance Static Sitting - Balance Support: Feet supported;No upper extremity supported Static Sitting - Level of Assistance: 7: Independent Dynamic Sitting Balance Dynamic Sitting - Balance Support: Feet supported;During functional activity Dynamic Sitting - Level of Assistance: 5: Stand by assistance Static Standing Balance Static Standing - Balance Support: No upper extremity supported Static Standing - Level of Assistance: 4: Min assist Dynamic Standing Balance Dynamic Standing - Balance Support: During functional activity Dynamic Standing - Level of Assistance: 3: Mod assist Extremity/Trunk Assessment RUE Assessment RUE Assessment: Exceptions to [facility] LUE Assessment LUE Assessment: Within Functional Limits   Maybelline Kolarik L Aparna Vanderweele 02/10/2020, 1:01 PM

## 2020-02-10 NOTE — Progress Notes (Signed)
Physical Therapy Session Note  Patient Details  Name: [name] [name] MRN: [id number] Date of Birth: 02-Jul-1932  Today's Date: 02/10/2020 PT Individual Time: 6503-5465 PT Individual Time Calculation (min): 59 min   Short Term Goals: Week 1:  PT Short Term Goal 1 (Week 1): Patient will roll B with no more than CGA and use of bed rails as needed PT Short Term Goal 2 (Week 1): Patient will transition supine > sit with no more than ModA x1 consistently PT Short Term Goal 3 (Week 1): Patient will transfer bed <> wc with no more than MaxA x1 at least 50% of the time with LRAD PT Short Term Goal 4 (Week 1): Patient will ambulate >30ft with +2 assist and LRAD Week 2:  PT Short Term Goal 1 (Week 2): Pt will complete bed mobility with no more than modA PT Short Term Goal 2 (Week 2): Pt will perform bed<>chair transfers with modA and LRAD PT Short Term Goal 3 (Week 2): Pt will ambulate 11ft with modA +2 with LRAD  Skilled Therapeutic Interventions/Progress Updates:    pt received in recliner and agreeable to therapy. Pt's daughter present. Pt directed in Sit to stand from recliner however pt unable to complete full standing on initial attempt. Pt directed in second attempt Sit to stand with mod A and pt able to squat pivot to WC. Daughter present and verbalized concern of pt's increased need of assist for transfer and requested pt's blood glucose be tested, this was completed by NCT and assessed to be 327. Nursing brought to room for further needs, and nursing reported she was safe to continue with PT for session. Pt taken to apartment total A for energy and time. Pt directed Sit to stand from [facility] to attempt bed mobility in apartment standard bed however pt unable to clear WC without high levels of assist and treatment redirected to progress mobility and coordination of RLE as pt reported she felt "tired". PT asked pt if she needed to rest and she declined. Pt directed in [facility] mobility with hemi technique  supervision for 150'. Pt then reported she was feeling a little better and directed in seated RLE mobility with initially rythmic initiation of hip flexion and knee extension however quickly pt able to activate and complete both activities 2x10. Pt then directed in active DF on RLE and pt reported being surprised she could accomplish this. Pt returned to room at end of session total A for energy and time. Pt directed in Stand pivot transfer to [facility] with mod A for stafety. Pt left in recliner, All needs in reach and in good condition. Call light in hand.  And alarm belt set. Nursing team aware.  Therapy Documentation Precautions:  Precautions Precautions: Fall Precaution Comments: R-hemi Restrictions Weight Bearing Restrictions: No General:   Vital Signs: Therapy Vitals Temp: (!) 97.4 F (36.3 C) Pulse Rate: (!) 52 Resp: 18 BP: (!) 132/46 Patient Position (if appropriate): Sitting Oxygen Therapy SpO2: 98 % O2 Device: Room Air Pain: Pain Assessment Pain Scale: 0-10 Pain Score: 0-No pain Mobility: Bed Mobility Bed Mobility: Rolling Right;Rolling Left;Supine to Sit;Sit to Supine;Sitting - Scoot to Edge of Bed Rolling Right: Minimal Assistance - Patient > 75% Rolling Left: Minimal Assistance - Patient > 75% Supine to Sit: Minimal Assistance - Patient > 75% Sitting - Scoot to Edge of Bed: Moderate Assistance - Patient 50-74% Sit to Supine: Minimal Assistance - Patient > 75% Transfers Transfers: Sit to Stand;Stand to [employer] Transfers Sit to  Stand: Minimal Assistance - Patient > 75% Stand to Sit: Minimal Assistance - Patient > 75% Stand Pivot Transfers: Moderate Assistance - Patient 50 - 74% Stand Pivot Transfer Details: Tactile cues for weight shifting;Tactile cues for placement;Tactile cues for weight beaing;Tactile cues for posture;Tactile cues for sequencing;Visual cues/gestures for precautions/safety;Verbal cues for sequencing;Verbal cues for precautions/safety;Verbal cues  for technique;Verbal cues for gait pattern;Manual facilitation for weight shifting Squat Pivot Transfers: Moderate Assistance - Patient 50-74% Transfer (Assistive device): Rolling walker Locomotion : Gait Ambulation: Yes Gait Assistance: 2 Helpers Gait Distance (Feet): 25 Feet Assistive device: Rolling walker Gait Assistance Details: Tactile cues for initiation;Tactile cues for sequencing;Tactile cues for weight shifting;Tactile cues for posture;Verbal cues for precautions/safety;Verbal cues for technique;Verbal cues for sequencing;Tactile cues for placement;Tactile cues for weight beaing;Manual facilitation for placement;Manual facilitation for weight shifting;Verbal cues for gait pattern;Verbal cues for safe use of DME/AE;Manual facilitation for weight bearing Gait Assistance Details: wc follow for safety Gait Gait: Yes Gait Pattern: Impaired Gait Pattern: Decreased step length - left;Step-to pattern;Decreased stance time - right;Decreased hip/knee flexion - right;Decreased weight shift to right;Decreased dorsiflexion - right;Right genu recurvatum;Lateral trunk lean to right;Trunk flexed;Narrow base of support;Trendelenburg;Right foot flat;Poor foot clearance - right Gait velocity: decreased Stairs / Additional Locomotion Stairs: No Wheelchair Mobility Wheelchair Mobility: Yes Wheelchair Assistance: Supervision/Verbal cueing Wheelchair Propulsion: Left upper extremity;Left lower extremity Wheelchair Parts Management: Needs assistance Distance: 133ft  Trunk/Postural Assessment : Cervical Assessment Cervical Assessment: Exceptions to [facility] (forward head) Thoracic Assessment Thoracic Assessment: Exceptions to [facility] (rounded shoulders) Lumbar Assessment Lumbar Assessment: Exceptions to [facility] (posterior pelvic tilt) Postural Control Postural Control: Deficits on evaluation Head Control: decreases with fatigue Trunk Control: Benefits from multi-modal cueing for corrections, especially in  standing  Balance: Balance Balance Assessed: Yes Static Sitting Balance Static Sitting - Balance Support: Feet supported;No upper extremity supported Static Sitting - Level of Assistance: 7: Independent Dynamic Sitting Balance Dynamic Sitting - Balance Support: Feet supported;During functional activity Dynamic Sitting - Level of Assistance: 5: Stand by assistance Static Standing Balance Static Standing - Balance Support: No upper extremity supported Static Standing - Level of Assistance: 4: Min assist Dynamic Standing Balance Dynamic Standing - Balance Support: During functional activity Dynamic Standing - Level of Assistance: 3: Mod assist Exercises:   Other Treatments:      Therapy/Group: Individual Therapy  Barbaraann Faster 02/10/2020, 2:00 PM

## 2020-02-10 NOTE — Progress Notes (Signed)
Physical Therapy Session Note  Patient Details  Name: [name] [name] MRN: [id number] Date of Birth: 1932/09/01  Today's Date: 02/10/2020 PT Individual Time: 0800-0855 PT Individual Time Calculation (min): 55 min   Short Term Goals: Week 3:  PT Short Term Goal 1 (Week 3): Pt will complete bed mobility with no more than minA PT Short Term Goal 2 (Week 3): Pt will complete functional transfers, consistently, with modA and LRAD PT Short Term Goal 3 (Week 3): Pt will ambulate 56ft with maxA and LRAD  Skilled Therapeutic Interventions/Progress Updates:     Pt greeted seated in recliner with daughter, [name] [name], at bedside. Pt without reports of pain. Focus of session to continue car transfers with Daughter and answer any remaninig questions from family regarding upcoming DC to her ALF. Pt performed stand<>pivot transfer with modA from recliner to w/c with cues for R foot placement, L hand placement to arm rest, and stepping pattern. She requires tactile feedback for weight shifting for completing this. W/c transport for energy conservation to ortho gym. Car height raised to 28 inches at patient performed car transfer with modA with similar technique as outlined above. Daughter present for observation but defer's hands-on training as she reports her brothers, [name] [name] and [name] [name], will be completing this transfer. Educated her on general sequencing and technique in order to help improve the carryover with their brothers. (Of note, [name] [name] has received hands-on training yesterday and has appropriate technique/understanding). Pt transported to dayroom gym where she completed x2 stand<>pivot transfers with modA to mat table. Performed repeated stands with minA from mat table height with assist primarily for powering to rising position. She was able to maintain standing, unsupported, with CGA. She was returned to her room and performed additional stand<>pivot with modA back to her recliner. She remained seated in recliner  with daughter at bedside and chair alarm on, RUE supported with pillow, needs in reach. All questions and concerns addressed from patient and family.   Therapy Documentation Precautions:  Precautions Precautions: Fall Precaution Comments: R-hemi; watch for subluxation Restrictions Weight Bearing Restrictions: No  Therapy/Group: Individual Therapy  [name] [name] PT 02/10/2020, 7:41 AM

## 2020-02-10 NOTE — Progress Notes (Signed)
Speech Language Pathology Daily Session Note  Patient Details  Name: DMYA LONG MRN: [id number] Date of Birth: May 28, 1932  Today's Date: 02/10/2020 SLP Individual Time: 7517-0017 SLP Individual Time Calculation (min): 27 min  Short Term Goals: Week 4: SLP Short Term Goal 1 (Week 4): STG's=LTG's (ELOS: d/c anticipated 12/16)  Skilled Therapeutic Interventions:Skilled ST services focused on  skills. SLP had planned reassessment of cognitive skills, however pt expressed fatigue. SLP will plan for reassessment of cognitive lingustic skills next session and focused on speech intelligibility strategies. Pt was unable to complete EMST exercises set at 8cm and even at 7cm H2O with appropriate strength, likely due to fatigue. SLP instructed pt to complete exercises as rest this afternoon, pt agreed. Pt expressed concern with miscalculation of words, therefore SLP instructed pt and pt returned demonstration of breaking multi-syllable words down to enhance over-articulation. Pt was left in room with daughter, call bell within reach and chair alarm set. SLP recommends to continue skilled services.     Pain Pain Assessment Pain Scale: 0-10 Pain Score: 0-No pain  Therapy/Group: Individual Therapy  Danity Schmelzer  [facility] 02/10/2020, 3:46 PM

## 2020-02-10 NOTE — Discharge Summary (Signed)
Physical Therapy Discharge Summary  Patient Details  Name: [name] [name] MRN: [id number] Date of Birth: 1932/11/24  Today's Date: 02/11/2020     Patient has met 11 of 11 long term goals due to improved activity tolerance, improved balance, improved postural control, increased strength, decreased pain, ability to compensate for deficits, functional use of  right upper extremity and right lower extremity, improved attention, improved awareness and improved coordination.  Patient to discharge at a wheelchair level min/modA level.  Patient will be discharging to an [facility] where staff and family will provide necessary physical assistance at discharge.  Reasons goals not met: all PT goals met   Recommendation:  Patient will benefit from ongoing skilled PT services in home health setting to continue to advance safe functional mobility, address ongoing impairments in R hemibody weakness, standing balance deficits, gait impairments, and her decline in functional mobility in order to minimize fall risk.  Equipment:  20x18 w/c. 20x18 unavailable due to supply shortages from [place]. A 20x16 w/c has been delivered.  Reasons for discharge: treatment goals met and discharge from hospital  Patient/family agrees with progress made and goals achieved: Yes  PT Discharge Precautions/Restrictions Precautions Precautions: Fall Precaution Comments: R-hemi; watch for subluxation Restrictions Weight Bearing Restrictions: No Vital Signs Therapy Vitals Temp: 97.6 F (36.4 C) Temp Source: Oral Pulse Rate: (!) 54 Resp: 19 BP: (!) 131/50 Patient Position (if appropriate): Sitting Oxygen Therapy SpO2: 96 % O2 Device: Room Air Vision/Perception  Perception Perception: Within Functional Limits Inattention/Neglect: Other (comment) (Mild R inattention) Praxis Praxis: Impaired Praxis Impairment Details: Motor planning Praxis-Other Comments: Much improved since date of evaluation   Cognition Overall Cognitive Status: Impaired/Different from baseline Arousal/Alertness: Awake/alert Orientation Level: Oriented X4 Problem Solving: Impaired Problem Solving Impairment: Verbal basic;Functional basic Safety/Judgment: Impaired Sensation Sensation Light Touch: Impaired by gross assessment Central sensation comments: Able to discern light touch but reports diminished sensation on RLE Light Touch Impaired Details: Impaired RLE Hot/Cold: Not tested Proprioception: Impaired by gross assessment Proprioception Impaired Details: Impaired RLE Coordination Gross Motor Movements are Fluid and Coordinated: No Fine Motor Movements are Fluid and Coordinated: No Coordination and Movement Description: R hemiparesis Motor  Motor Motor: Hemiplegia;Abnormal tone;Ataxia;Motor apraxia;Abnormal postural alignment and control Motor - Discharge Observations: R hemiparesis with tone in RUE. Limited to no pushing or R lean in sitting or standing, much improved since evaluation  Mobility Bed Mobility Rolling Right: Minimal Assistance - Patient > 75% Rolling Left: Minimal Assistance - Patient > 75% Supine to Sit: Minimal Assistance - Patient > 75% Sitting - Scoot to Edge of Bed: Moderate Assistance - Patient 50-74% Sit to Supine: Minimal Assistance - Patient > 75% Transfers Transfers: Sit to Stand;Stand to Sit;Stand Pivot Transfers Sit to Stand: Minimal Assistance - Patient > 75% Stand to Sit: Minimal Assistance - Patient > 75% Stand Pivot Transfers: Moderate Assistance - Patient 50 - 74% Stand Pivot Transfer Details: Tactile cues for weight shifting;Tactile cues for placement;Tactile cues for weight beaing;Tactile cues for posture;Tactile cues for sequencing;Visual cues/gestures for precautions/safety;Verbal cues for sequencing;Verbal cues for precautions/safety;Verbal cues for technique;Verbal cues for gait pattern;Manual facilitation for weight shifting Squat Pivot Transfers: Moderate  Assistance - Patient 50-74% Transfer (Assistive device): None Locomotion  Gait Ambulation: Yes Gait Assistance: 2 Helpers Gait Distance (Feet): 25 Feet Assistive device: Rolling walker Gait Assistance Details: Tactile cues for initiation;Tactile cues for sequencing;Tactile cues for weight shifting;Tactile cues for posture;Verbal cues for precautions/safety;Verbal cues for technique;Verbal cues for sequencing;Tactile cues for placement;Tactile cues for weight beaing;Manual  facilitation for placement;Manual facilitation for weight shifting;Verbal cues for gait pattern;Verbal cues for safe use of DME/AE;Manual facilitation for weight bearing Gait Assistance Details: wc follow for safety Gait Gait: Yes Gait Pattern: Impaired Gait Pattern: Decreased step length - left;Step-to pattern;Decreased stance time - right;Decreased hip/knee flexion - right;Decreased weight shift to right;Decreased dorsiflexion - right;Right genu recurvatum;Lateral trunk lean to right;Trunk flexed;Narrow base of support;Trendelenburg;Right foot flat;Poor foot clearance - right Gait velocity: decreased Stairs / Additional Locomotion Stairs: No Wheelchair Mobility Wheelchair Mobility: Yes Wheelchair Assistance: Supervision/Verbal cueing Wheelchair Propulsion: Left upper extremity;Left lower extremity Wheelchair Parts Management: Needs assistance Distance: 160f  Trunk/Postural Assessment  Cervical Assessment Cervical Assessment: Exceptions to [facility](forward head) Thoracic Assessment Thoracic Assessment: Exceptions to [facility](kyphosis and rounded shoulders) Lumbar Assessment Lumbar Assessment: Exceptions to [facility](posterior pelvic tilt) Postural Control Postural Control: Deficits on evaluation Head Control: decreases with fatigue Trunk Control: Benefits from multi-modal cueing for corrections, especially in standing  Balance Balance Balance Assessed: Yes Static Sitting Balance Static Sitting - Balance Support: Feet  supported;No upper extremity supported Static Sitting - Level of Assistance: 7: Independent Dynamic Sitting Balance Dynamic Sitting - Balance Support: Feet supported;During functional activity Dynamic Sitting - Level of Assistance: 4: Min assist Static Standing Balance Static Standing - Balance Support: No upper extremity supported Static Standing - Level of Assistance: 4: Min assist Dynamic Standing Balance Dynamic Standing - Balance Support: During functional activity Dynamic Standing - Level of Assistance: 3: Mod assist Extremity Assessment  RUE Assessment RUE Assessment: Exceptions to [facility] Assessment LUE Assessment: Within Functional Limits RLE Assessment RLE Assessment: Exceptions to [facility] Strength Comments: ankle DF 2+/5, knee ext 3+/5, knee flex 2/5, hip flex 2+/5 LLE Assessment LLE Assessment: Within Functional Limits  Christian P Manhard PT 02/10/2020, 8:00 AM

## 2020-02-10 NOTE — Progress Notes (Addendum)
Patient ID: [name] [name], female   DOB: 1932-04-14, 86 y.o.   MRN: [id number] Working on multiple paperwork needed of assisting living facility. Have asked Pam-PA to call in scripts so can be ready when transferred on Thursday. [facility] which [facility] uses.  2:00 PM Only able to get a 20x16 wheelchair no 20x18 wheelchair available due to shortage due to COVID. [employer] may get one on truck Thursday. Have messaged son-[name] and [name]-daughter here. Ordered right half lap tray for delivery to room.  3:00 PM Pam-PA has reached out to Sabrina-Adm at [facility] regarding prescriptions for pt. Sending them to [place] for pt which [facility] goes through for medications for their residents

## 2020-02-11 ENCOUNTER — Inpatient Hospital Stay (HOSPITAL_COMMUNITY): Payer: Medicare Other | Admitting: Occupational Therapy

## 2020-02-11 ENCOUNTER — Inpatient Hospital Stay (HOSPITAL_COMMUNITY): Payer: Medicare Other | Admitting: Physical Therapy

## 2020-02-11 ENCOUNTER — Inpatient Hospital Stay (HOSPITAL_COMMUNITY): Payer: Medicare Other

## 2020-02-11 ENCOUNTER — Encounter (HOSPITAL_COMMUNITY): Payer: Medicare Other | Admitting: Psychology

## 2020-02-11 LAB — URINALYSIS, ROUTINE W REFLEX MICROSCOPIC
Bacteria, UA: NONE SEEN
Bilirubin Urine: NEGATIVE
Glucose, UA: NEGATIVE mg/dL
Hgb urine dipstick: NEGATIVE
Ketones, ur: NEGATIVE mg/dL
Leukocytes,Ua: NEGATIVE
Nitrite: NEGATIVE
Protein, ur: 30 mg/dL — AB
Specific Gravity, Urine: 1.013 (ref 1.005–1.030)
pH: 5 (ref 5.0–8.0)

## 2020-02-11 LAB — GLUCOSE, CAPILLARY
Glucose-Capillary: 158 mg/dL — ABNORMAL HIGH (ref 70–99)
Glucose-Capillary: 204 mg/dL — ABNORMAL HIGH (ref 70–99)
Glucose-Capillary: 207 mg/dL — ABNORMAL HIGH (ref 70–99)
Glucose-Capillary: 291 mg/dL — ABNORMAL HIGH (ref 70–99)

## 2020-02-11 LAB — SARS CORONAVIRUS 2 BY RT PCR (HOSPITAL ORDER, PERFORMED IN ~~LOC~~ HOSPITAL LAB): SARS Coronavirus 2: NEGATIVE

## 2020-02-11 NOTE — Patient Care Conference (Signed)
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 Delice Lesch Care Coodinator Present: Dorien Chihuahua, RN, BSN, CRRN;Becky Dupree, LCSW Nurse Present: Other (comment) Philip Aspen, RN) PT Present: Barrie Folk, PT OT Present: Leretha Pol, OT SLP Present: Charolett Bumpers, SLP     Current Status/Progress Goal Weekly Team Focus  Bowel/Bladder   Continent B/B. LBM 02/10/20  maintain continence  toilet q2h   Swallow/Nutrition/ Hydration   regular textures and thin liquids, mod I  Mod I - goal met  education   ADL's   min A - LTGs met - ready for discharge  min assist  pt/family education   Mobility   modA bed mobility, modA stand>pivot transfers. Has ambulated 23ft with modA and RW however patient has been requesting to focus on w/c mobility to reach mod I level. Can propel herself in w/c ~150-186ft with supervision via hemitechniuqe  Goals have been downgraded to [employer] transfers (Son, Yvone Neu, has  been provided hands on training for car transfers). Family training, functional transfers, R hembody neuro re-ed, w/c mobility.   Communication   Supervision A  Supervision A  education   Safety/Cognition/ Behavioral Observations  Min-Supervision A  Min-Supervision A  education   Pain   c/o occasional pain in RLE  Reach pain goal 0/10  administer Tylenol prn as ordered   Skin   skin intact           Discharge Planning:  Going to [facility] tomorrow, they evaluated pt and felt able to provide her needs and care. Family to transport to facility tomorrow.   Team Discussion: Patient has made progress towards min assist goals.  Patient on target to meet rehab goals: yes, met goals for memory error awareness, problem solving and swalling  *See Care Plan and progress notes for long and short-term goals.   Revisions to Treatment Plan:  Downgraded goals for PT Teaching Needs: Transfers, toileting, medications, etc  Current Barriers to Discharge: Limited support at discharge  Possible Resolutions to Barriers: ALF facility Recommended SLP follow up for speech volume    Medical Summary Current Status: Right hemiparesis with inattention, cognitive deficits decreased initiation and delayed processing, dysphagia as well as expressive deficits affecting ability to carry out ADLs and mobility secondary to acute left thalamic and basal ganglia hemorrhage.  Barriers to Discharge: Medical stability;Decreased family/caregiver support   Possible Resolutions to [employer] Focus: Therapies, follow labs - Cr, platelets, optimize DM meds, patient and family edu,  continue pain meds   Continued Need for Acute Rehabilitation Level of Care: The patient requires daily medical management by a physician with specialized training in physical medicine and rehabilitation for the following reasons: Direction of a multidisciplinary physical rehabilitation program to maximize functional independence :  Yes Medical management of patient stability for increased activity during participation in an intensive rehabilitation regime.: Yes Analysis of laboratory values and/or radiology reports with any subsequent need for medication adjustment and/or medical intervention. : Yes   I attest that I was present, lead the team conference, and concur with the assessment and plan of the team.   Dorien Chihuahua B 02/11/2020, 1:52 PM

## 2020-02-11 NOTE — Progress Notes (Signed)
Occupational Therapy Session Note  Patient Details  Name: [name] [name] MRN: [id number] Date of Birth: [birth year]  Today's Date: 02/11/2020 OT Individual Time: 830-930 and 1135-1200  OT Individual Time Calculation (min):  60 min and 25 min    Short Term Goals: Week 1:  OT Short Term Goal 1 (Week 1): Pt will complete toilet transfer with max assist. OT Short Term Goal 1 - Progress (Week 1): Met OT Short Term Goal 2 (Week 1): Pt will engage in self-care task in standing for 30 seconds with mod assist. OT Short Term Goal 2 - Progress (Week 1): Met OT Short Term Goal 3 (Week 1): Pt will don shirt with mod assist OT Short Term Goal 3 - Progress (Week 1): Met OT Short Term Goal 4 (Week 1): Pt will demonstrate dynamic sitting balance for 1 minute with mod asisst. OT Short Term Goal 4 - Progress (Week 1): Met Week 2:  OT Short Term Goal 1 (Week 2): Pt will complete LB dressing using AE as needed with min assist OT Short Term Goal 1 - Progress (Week 2): Progressing toward goal OT Short Term Goal 2 (Week 2): Pt will complete sit to stand with min assist OT Short Term Goal 2 - Progress (Week 2): Progressing toward goal OT Short Term Goal 3 (Week 2): Pt will achive 45 degrees active right shoulder scaption to assist with self care. OT Short Term Goal 3 - Progress (Week 2): Met OT Short Term Goal 4 (Week 2): Pt will complete toileting with min assist. OT Short Term Goal 4 - Progress (Week 2): Progressing toward goal Week 3:  OT Short Term Goal 1 (Week 3): Pt will complete LB dressing using AE as needed with min assist OT Short Term Goal 1 - Progress (Week 3): Met OT Short Term Goal 2 (Week 3): Pt will complete sit to stand with min assist OT Short Term Goal 2 - Progress (Week 3): Met OT Short Term Goal 3 (Week 3): Pt will complete toileting with min assist. OT Short Term Goal 3 - Progress (Week 3): Met OT Short Term Goal 4 (Week 3): Pt will complete toilet transfer with mod assist OT Short  Term Goal 4 - Progress (Week 3): Met  Week 4:  OT Short Term Goal 1 (Week 4): STGs=LTGs due to ELOS  Skilled Therapeutic Interventions/Progress Updates:    Visit 1: no c/o pain   Pt seen for BADL retraining of toileting, bathing, and dressing with a focus on balance, adaptive strategies, and use of R side.  Pt received in wc and completed stand pivot with bar to toilet and then to shower bench with just min A demonstrating good awareness of R foot placement.  In shower, she used lateral and forward leans to wash her bottom and long sponge to reach feet and under L arm.   Transferred back to wc to dress with min A overall.  Worked on her standing with L hand on bar and then releasing to pull pants over her hips.    Pt's dtr present for the session.  Pt participated very well, resting in wc with all needs met.    Visit 2: no c/o pain Pt resting in recliner with dtr in the room.  Pt needed to stand and adjust her pants as she felt they were up too tight.  Pt then sat and worked on [employer] with A/arom exercises. Reviewed and discussed neuroplasticity and the need for repetiition of movement patterns for  improved RUE functional use, practiced several exercises that she could do on her own focusing more on her elbow and wrist functional arom to increase distal use of her arm for self feeding.  Pt repeat demonstrated exercises well and dtr took note of what her mom should work on.  Pt resting in recliner with all needs met and dtr in the room with her.   Therapy Documentation Precautions:  Precautions Precautions: Fall Precaution Comments: R-hemi Restrictions Weight Bearing Restrictions: No    Vital Signs: Therapy Vitals Pulse Rate: (!) 56 BP: 137/85  Pain Assessment Pain Score: 0-No pain ADL: ADL Equipment Provided: Reacher,Long-handled sponge Eating: Set up Where Assessed-Eating: Chair Grooming: Supervision/safety Where Assessed-Grooming: Sitting at sink Upper Body Bathing:  Supervision/safety Where Assessed-Upper Body Bathing: Shower Lower Body Bathing: Supervision/safety Where Assessed-Lower Body Bathing: Shower Upper Body Dressing: Setup Where Assessed-Upper Body Dressing: Wheelchair Lower Body Dressing: Minimal assistance Where Assessed-Lower Body Dressing: Wheelchair Toileting: Minimal assistance Where Assessed-Toileting: Glass blower/designer: Psychiatric nurse Method: Arts development officer: Child psychotherapist: Network engineer bench,Walk in Facilities manager Transfer: Environmental education officer Method: Radiographer, therapeutic: Grab bars     Therapy/Group: Individual Therapy  [place] 02/11/2020, 12:19 PM

## 2020-02-11 NOTE — Progress Notes (Signed)
Patient ID: [name] [name], female   DOB: Sep 06, 1932, 86 y.o.   MRN: [id number] Information packet faxed to [facility] in anticipation of admission tomorrow. Will fax DC summary and AVS along with result from COVID test tomorrow am.

## 2020-02-11 NOTE — Progress Notes (Addendum)
Inpatient Rehabilitation Care Coordinator  Discharge Note  The overall goal for the admission was met for:   Discharge location: Yes-[facility]  Length of Stay: Yes-27 days  Discharge activity level: Yes-MIN-MOD LEVEL  Home/community participation: Yes  Services provided included: MD, RD, PT, OT, SLP, RN, CM, TR, Pharmacy, Neuropsych and SW  Financial Services: Private Insurance: [employer]  Follow-up services arranged: Outpatient: PT,OT,SP THROUGH CONTRACTED AGENCY AT [facility], DME: [facility]-WHEELCHAIR, ROLLING WALKER, 3 IN 1 AND [facility]ISP[facility] BED and Patient/Family has no preference for HH/DME agencies  Comments (or additional information):ALL FOUR CHILDREN WERE HERE THROUGHOUT PT'S STAY. FELT WANTED HER TO GO WHERE SHE COULD STAY LONG TERM AND ACCEPTED AT [facility]. PAPERWORK FAXED TO FACILITY TB AND COVID TEST DONE WHILE HERE. FACILITY EVALUATED AND FELT COULD MEET HER 24/7 PHYSICAL CARE NEEDS. NUMBER GIVEN TO BEDSIDE RN FOR REPORT-DOTTIE Noberto [facility]ort [phone]  Patient/Family verbalized understanding of follow-up arrangements: Yes  Individual responsible for coordination of the follow-up plan: EDIE [phone]  Confirmed correct DME delivered: Elease Hashimoto 02/11/2020    Elease Hashimoto

## 2020-02-11 NOTE — Progress Notes (Signed)
Physical Therapy Session Note  Patient Details  Name: [name] [name] MRN: [id number] Date of Birth: 1932/11/03  Today's Date: 02/11/2020 PT Individual Time: 3818-4037 PT Individual Time Calculation (min): 57 min   Short Term Goals: Week 1:  PT Short Term Goal 1 (Week 1): Patient will roll B with no more than CGA and use of bed rails as needed PT Short Term Goal 2 (Week 1): Patient will transition supine > sit with no more than ModA x1 consistently PT Short Term Goal 3 (Week 1): Patient will transfer bed <> wc with no more than MaxA x1 at least 50% of the time with LRAD PT Short Term Goal 4 (Week 1): Patient will ambulate >82f with +2 assist and LRAD Week 2:  PT Short Term Goal 1 (Week 2): Pt will complete bed mobility with no more than modA PT Short Term Goal 2 (Week 2): Pt will perform bed<>chair transfers with modA and LRAD PT Short Term Goal 3 (Week 2): Pt will ambulate 110fwith modA +2 with LRAD Week 3:  PT Short Term Goal 1 (Week 3): Pt will complete bed mobility with no more than minA PT Short Term Goal 2 (Week 3): Pt will complete functional transfers, consistently, with modA and LRAD PT Short Term Goal 3 (Week 3): Pt will ambulate 1526fith maxA and LRAD  Skilled Therapeutic Interventions/Progress Updates:    Pt received in recliner and agreeable to PT. PT performed wc management in order to drop seat height so that patient may propel wc independently with hemi technique. Pt performed stand pivot transfer with min assist/heavy CGA to wc. Pt propelled wc ~200f30fth supervision and verbal cueing in order to maintain straight negotiation through obstacles and hallways.  Pt participated in one game of wii bowling (first 2 frames in seated and the rest standing with RW). Pt requiring min assist to maintain standing balance while performing bowling technique as she begins to lose of her balance to the L and posterior.   Pt transported back to room and performed stand pivot transfer  to recliner with min assist and heavy verbal cueing for foot placement, especially with turning and backwards stepping. Pt left seated in recliner with call bell in reach, needs met, and chair alarm on. Dgt at bedside upon departure. No pain reported this session.   Therapy Documentation Precautions:  Precautions Precautions: Fall Precaution Comments: R-hemi Restrictions Weight Bearing Restrictions: No   Therapy/Group: Individual Therapy  BailGaylord Shih15/2021, 4:53 PM

## 2020-02-11 NOTE — Plan of Care (Signed)
  Problem: RH Balance Goal: LTG Patient will maintain dynamic sitting balance (PT) Description: LTG:  Patient will maintain dynamic sitting balance with assistance during mobility activities (PT) Outcome: Completed/Met Goal: LTG Patient will maintain dynamic standing balance (PT) Description: LTG:  Patient will maintain dynamic standing balance with assistance during mobility activities (PT) Outcome: Completed/Met   Problem: Sit to Stand Goal: LTG:  Patient will perform sit to stand with assistance level (PT) Description: LTG:  Patient will perform sit to stand with assistance level (PT) Outcome: Completed/Met   Problem: RH Bed Mobility Goal: LTG Patient will perform bed mobility with assist (PT) Description: LTG: Patient will perform bed mobility with assistance, with/without cues (PT). Outcome: Completed/Met   Problem: RH Bed to Chair Transfers Goal: LTG Patient will perform bed/chair transfers w/assist (PT) Description: LTG: Patient will perform bed to chair transfers with assistance (PT). Outcome: Completed/Met   Problem: RH Car Transfers Goal: LTG Patient will perform car transfers with assist (PT) Description: LTG: Patient will perform car transfers with assistance (PT). Outcome: Completed/Met   Problem: RH Furniture Transfers Goal: LTG Patient will perform furniture transfers w/assist (OT/PT) Description: LTG: Patient will perform furniture transfers  with assistance (OT/PT). Outcome: Completed/Met   Problem: RH Ambulation Goal: LTG Patient will ambulate in controlled environment (PT) Description: LTG: Patient will ambulate in a controlled environment, # of feet with assistance (PT). Outcome: Completed/Met Goal: LTG Patient will ambulate in home environment (PT) Description: LTG: Patient will ambulate in home environment, # of feet with assistance (PT). Outcome: Completed/Met   Problem: RH Wheelchair Mobility Goal: LTG Patient will propel w/c in controlled environment  (PT) Description: LTG: Patient will propel wheelchair in controlled environment, # of feet with assist (PT) Outcome: Completed/Met Goal: LTG Patient will propel w/c in home environment (PT) Description: LTG: Patient will propel wheelchair in home environment, # of feet with assistance (PT). Outcome: Completed/Met

## 2020-02-11 NOTE — Discharge Instructions (Signed)
COMMUNITY REFERRALS UPON DISCHARGE:  WILL HAVE FOLLOW UP PT,OT,SP AT [facility] THROUGH THEIR CONTRACTED AGENCY  Medical Equipment/Items Ordered:WHEELCHAIR, BEDSIDE COMMODE AND HOSPITAL BED                                                 Agency/Supplier:[facility]  [phone]

## 2020-02-11 NOTE — Progress Notes (Addendum)
[place] PHYSICAL MEDICINE & REHABILITATION PROGRESS NOTE   Subjective/Complaints: Patient seen transferring with therapies this morning.  Daughter at bedside.  Patient states she slept well overnight.  She states she is looking forward to being discharged tomorrow.  Daughter with questions regarding medications again, attempted to reassure again.  ROS: Denies CP, SOB, N/V/D  Objective:   No results found. Recent Labs    02/09/20 0519  WBC 5.2  HGB 12.4  HCT 38.7  PLT 168   Recent Labs    02/09/20 0519  NA 136  K 3.8  CL 100  CO2 25  GLUCOSE 222*  BUN 30*  CREATININE 1.73*  CALCIUM 9.1    Intake/Output Summary (Last 24 hours) at 02/11/2020 1051 Last data filed at 02/10/2020 1725 Gross per 24 hour  Intake 317 ml  Output --  Net 317 ml        Physical Exam: Vital Signs Blood pressure (!) 142/52, pulse (!) 56, temperature 98.3 F (36.8 C), temperature source Oral, resp. rate 18, height 5\' 3"  (1.6 m), weight 101.3 kg, SpO2 95 %.  Constitutional: No distress . Vital signs reviewed. HENT: Normocephalic.  Atraumatic. Eyes: EOMI. No discharge. Cardiovascular: No JVD.  RRR. Respiratory: Normal effort.  No stridor.  Bilateral clear to auscultation. GI: Non-distended.  BS +. Skin: Warm and dry.  Intact. Psych: Normal mood.  Normal behavior. Musc: No edema in extremities.  No tenderness in extremities. Neuro: Alert Motor: RUE: 3-3/5 proximal to distal with apraxia RLE: 2+ to 3 -/5 HF, KE, 2+/5 ADF with apraxia, improving  Assessment/Plan: 1. Functional deficits which require 3+ hours per day of interdisciplinary therapy in a comprehensive inpatient rehab setting.  Physiatrist is providing close team supervision and 24 hour management of active medical problems listed below.  Physiatrist and rehab team continue to assess barriers to discharge/monitor patient progress toward functional and medical goals  Care Tool:  Bathing    Body parts bathed by patient:  Chest,Abdomen,Right upper leg,Left upper leg,Face,Right arm,Left arm,Right lower leg,Left lower leg,Front perineal area,Buttocks   Body parts bathed by helper: Right arm,Chest,Abdomen,Right upper leg,Left upper leg,Right lower leg,Left lower leg,Face,Left arm,Front perineal area Body parts n/a: Buttocks   Bathing assist Assist Level: Minimal Assistance - Patient > 75%     Upper Body Dressing/Undressing Upper body dressing   What is the patient wearing?: Pull over shirt,Bra    Upper body assist Assist Level: Set up assist    Lower Body Dressing/Undressing Lower body dressing      What is the patient wearing?: Underwear/pull up,Pants     Lower body assist Assist for lower body dressing: Minimal Assistance - Patient > 75%     Toileting Toileting Toileting Activity did not occur (Clothing management and hygiene only): N/A (no void or bm)  Toileting assist Assist for toileting: Minimal Assistance - Patient > 75%     Transfers Chair/bed transfer  Transfers assist     Chair/bed transfer assist level: Moderate Assistance - Patient 50 - 74%     Locomotion Ambulation   Ambulation assist   Ambulation activity did not occur: Safety/medical concerns  Assist level: 2 helpers (chair follow) Assistive device: Walker-rolling Max distance: 43ft   Walk 10 feet activity   Assist  Walk 10 feet activity did not occur: Safety/medical concerns  Assist level: 2 helpers (chair follow) Assistive device: Walker-rolling   Walk 50 feet activity   Assist Walk 50 feet with 2 turns activity did not occur: Safety/medical concerns  Walk 150 feet activity   Assist Walk 150 feet activity did not occur: Safety/medical concerns         Walk 10 feet on uneven surface  activity   Assist Walk 10 feet on uneven surfaces activity did not occur: Safety/medical concerns         Wheelchair     Assist Will patient use wheelchair at discharge?: Yes Type of  Wheelchair: Manual    Wheelchair assist level: Supervision/Verbal cueing,Set up assist Max wheelchair distance: 155ft    Wheelchair 50 feet with 2 turns activity    Assist        Assist Level: Supervision/Verbal cueing   Wheelchair 150 feet activity     Assist      Assist Level: Supervision/Verbal cueing   Medical Problem List and Plan: 1. 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  Continue CIR, patient family education  WHO/PRAFO nightly  Team conference today to discuss current and goals and coordination of care, home and environmental barriers, and discharge planning with nursing, case manager, and therapies. Please see conference note from today as well.  2. Antithrombotics: -DVT/anticoagulation:Pharmaceutical:Heparin -antiplatelet therapy: N/a 3. Pain Management:Tylenol as needed.   Continue Voltaren gel  Right knee intra-articular steroid injection (supposed to have knee replacement in 02/2020) performed on 12/2  continue kpad and lidocaine patch for lower back with good results.   Controlled with meds on 12/15 4. Mood:LCSW to follow for evaluation and support when appropriate. -antipsychotic agents: N/A  Seroquel prn HS 25mg  per son.   12/12: discussed with son weaning seroquel to 12.5mg  and he is agreeable.   Nursing order for lavender drops on forehead at night.   Amantadine 100 daily on 12/10, wean as outpatient 5. Neuropsych: This patientis notcapable of making decisions on herown behalf. 6. Skin/Wound Care:Routine pressure-relief measures. Maintain adequate nutrition and hydration status. 7. Fluids/Electrolytes/Nutrition:Monitor intake/output--  Will need assistance with feeding as due to fluctuating bouts of  lethargy.  8. HTN: Monitor BP --continue Lasix, Hydralazine, Metoprolol  HCTZ changed to chlorthalidone on 11/25 , increased on 12/6            Vitals:   02/11/20 0353 02/11/20 0951  BP: (!) 144/40 (!) 142/52  Pulse: (!) 56 (!) 56  Resp:    Temp:    SpO2: 95%    Improving control on 12/15 9. Acute on chronic kidney injury: BUN/SCr 34/1.5 at admission.  Decreased lasix to 10mg   Creatinine 1.73 on 12/13  Encourage fluids 10. PNA?/Tracheobronchitis: Completed course of antibiotics 11. Leukocytosis: Resolved   12. T2DM with hyperglycemia:  Monitor BS ac/hs.   CBG (last 3)  Recent Labs    02/10/20 1622 02/10/20 2059 02/11/20 0603  GLUCAP 175* 247* 158*   Lantus decreased to 48U, decreased to 35 twice daily on 11/23, decreased to 28 on 11/24, increased to 30 twice daily on 11/30, increase to 34 twice daily on 12/2, increased to 36 twice daily on 12/8, increased to 38 on 12/11, increased to 40 on 12/14  Labile, but improving on 12/15, would not make any changes today given recent changes 13. Post stroke dysphagia :             Advance to regular thins with intermittent supervision, continue to advance diet as tolerated. 14.  Hyperkalemia  Potassium 3.8 on 12/13 15.  Slow  transit constipation  Bowel meds increased on 11/28, increased on 12/13  Improving 16. Sleep disturbance  AM lethargy with Trazodone, d/ced  Improved.  17.  Right knee OA  See #3  Improved 18.  Thrombocytopenia  Platelets 168 on 12/13  Continue to monitor   LOS: 26 days A FACE TO FACE EVALUATION WAS PERFORMED  [name] [name] 02/11/2020, 10:51 AM

## 2020-02-11 NOTE — Consult Note (Signed)
Neuropsychological Consultation   Patient:   [name] [name]   DOB:   06/01/1932  MR Number:  [id number]  Location:  [facility] [address] CENTER A [id number] [place] [address] Dept: [phone] Loc: [phone]           Date of Service:   02/11/2020  Start Time:   3 PM End Time:   3 PM  Provider/Observer:  Arley Phenix, Psy.D.       Clinical Neuropsychologist       Billing Code/Service: [id number]/[id number]  Chief Complaint:    [name] [name] is an 86 year old female with a history of type 2 diabetes, chronic kidney disease, hypertension, congestive heart failure, morbid obesity.  Patient was admitted on 01/04/2020 after collapsing at church during church service.  Patient was initially found to have right hemiparesis with dysarthria.  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  Patient had acute hypoxic/hypercapnic respiratory failure felt due to hemorrhagic stroke.  Patient was able to tolerate extubation on 01/06/2020.  Patient had follow-up MRI showing no visible mass or vascular lesion, extensive small vessel ischemia and acute left thalamic bleed without significant change.  On 01/13/2020 patient was noted to have change in mental status with significant somnolence and inability to follow any commands.  Repeat CT showed subacute left thalamic hematoma without acute or interval findings.  There was a question whether these changes were due to seizure.  Patient had a negative for seizure EEG.  Mental status was slowly improving with increased verbal output and patient referred for CIR due to limitations from right hemiparesis with inattention, cognitive deficits, decreased initiation and activity and delayed processing speed as well as expressive deficits.  Reason for Service:  Patient was referred for  neuropsychological consultation due to coping and adjustment issues, history of anxiety issues and ongoing residual cognitive deficits.  Below see HPI for the current admission.  HPI:  [name] [name] is an 86 year old female with history of T2DM, CKD (Dr. Eliott Nine) HTN, CHF,morbid obesity--BMI 41;who was admitted on 01/04/2020 after collapsing at church during her service. History taken from chart review due to aphasia. On admission she was found to have right hemiparesis with dysarthria.  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 She was started on Cleviprex and labetalol for BP control and required intubation for airway protection due to progressive decline in LOC. Acute hypoxic/hypercapnic respiratory failure felt to be due tohemorrhagic stroke was able to tolerate extubation on 01/06/2020.Follow-up MRI/MRI brain showed no visible mass or vascular lesion, extensive small vessel ischemia and acute left thalamic bleed without significant change. Carotid Dopplers were negative for significant ICA stenosi.Echocardiogram with EF of 55-60% with no wal abnormality and moderate mitral annular calcification. Tube feeds initiated for nutritional support.IV abx added on 01/08/2020 due to ongoing lethargy as well as low-grade fevers with leukocytosiswith WBC-15.3. Blood cultures x2 from11/10 were negative for growth.  On 01/13/2020,she was noted to have change in mental status with significant somnolence and inability to follow any commands.Repeat CT head showed subacute left thalamic hematoma without acute or interval findings. Dr. Jerrell Belfast question seizures as cause for mental status change and EEG ordered which was negative for seizures or epileptiform discharges. Her mentation  is slowly improving with increase in verbal output and diet has been advanced to dysphagia #2, thin liquids. She has  had steady worsening of renal status with recurrent leucocytosis. Mentation continues to fluctuate. Cortak removed today. Therapy cotreatment ongoing with patient showing ability to activate RLE and stand in steady. She continues to have limitations due to right hemiparesis with inattention, cognitive deficits decreased initiation and delayed processing as well as expressive deficits affecting ability to carry out ADLs and mobility. CIR was recommended due to functional decline. Please see preadmission assessment from earlier today as well.   Current Status:  Patient has been making significant improvements in her status.  She was sitting up in a chair alert and oriented when I saw her in good spirits.  Cognition and mental status appeared to be quite good.  She was aware of recent events and was able to give details of the planned post discharge tomorrow.  She will be going to an assisted living facility that she was already aware of and the family has gotten a nice apartment for her there.  While the patient reports she is somewhat worried about leaving the home she has lived in for 60 years and the friends in the neighborhood that she has she is looking forward to meeting new people and making new friends at the new assisted living facility.  She denies any significant anxiety reports that she is coping well.  She was aware of follow-up visits being scheduled for therapy and with Dr. Allena Katz.  Behavioral Observation: [name] [name]  presents as a 86 y.o.-year-old Right Caucasian Female who appeared her stated age. her dress was Appropriate and she was Well Groomed and her manners were Appropriate to the situation.  her participation was indicative of Appropriate and Attentive behaviors.  There were any physical disabilities noted.  she displayed an appropriate level of cooperation and motivation.     Interactions:    Active Appropriate and Attentive  Attention:   within normal limits    Memory:   within normal limits; recent and remote memory intact  Visuo-spatial:  not examined  Speech (Volume):  normal  Speech:   normal; normal  Thought Process:  Coherent and Relevant  Though Content:  WNL; not suicidal and not homicidal  Orientation:   person, place, time/date and situation  Judgment:   Good  Planning:   Fair  Affect:    Appropriate  Mood:    Anxious  Insight:   Good and Fair  Intelligence:   normal  Medical History:   Past Medical History:  Diagnosis Date  . Colon polyp   . Diabetes mellitus 1992  . Hypertension 1996  . Pancreatitis    Psychiatric History:  No prior psychiatric history the patient had been living independently prior to her recent hemorrhagic stroke.  Family Med/Psych History: History reviewed. No pertinent family history.  Impression/DX:  [name] [name] is an 86 year old female with a history of type 2 diabetes, chronic kidney disease, hypertension, congestive heart failure, morbid obesity.  Patient was admitted on 01/04/2020 after collapsing at church during church service.  Patient was initially found to have right hemiparesis with dysarthria.  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  Patient had acute hypoxic/hypercapnic respiratory failure felt due to hemorrhagic stroke.  Patient was able to tolerate extubation on 01/06/2020.  Patient had follow-up MRI showing no visible  mass or vascular lesion, extensive small vessel ischemia and acute left thalamic bleed without significant change.  On 01/13/2020 patient was noted to have change in mental status with significant somnolence and inability to follow any commands.  Repeat CT showed subacute left thalamic hematoma without acute or interval findings.  There was a question whether these changes were due to seizure.  Patient had a negative for seizure EEG.  Mental status was  slowly improving with increased verbal output and patient referred for CIR due to limitations from right hemiparesis with inattention, cognitive deficits, decreased initiation and activity and delayed processing speed as well as expressive deficits.  Patient has been making significant improvements in her status.  She was sitting up in a chair alert and oriented when I saw her in good spirits.  Cognition and mental status appeared to be quite good.  She was aware of recent events and was able to give details of the planned post discharge tomorrow.  She will be going to an assisted living facility that she was already aware of and the family has gotten a nice apartment for her there.  While the patient reports she is somewhat worried about leaving the home she has lived in for 60 years and the friends in the neighborhood that she has she is looking forward to meeting new people and making new friends at the new assisted living facility.  She denies any significant anxiety reports that she is coping well.  She was aware of follow-up visits being scheduled for therapy and with Dr. Allena Katz.   Disposition/Plan:  Patient is being discharged tomorrow.  Diagnosis:    Anxiety State        Electronically Signed   _______________________ Arley Phenix, Psy.D.

## 2020-02-11 NOTE — Progress Notes (Signed)
Speech Language Pathology Discharge Summary  Patient Details  Name: [name] [name] MRN: [id number] Date of Birth: 04/02/1932  Today's Date: 02/11/2020 SLP Individual Time: 1002-1030 SLP Individual Time Calculation (min): 28 min   Skilled Therapeutic Interventions: Skilled ST services focused on education and cognitive skills. SLP administrated cognitive linguistic assessment  SLUMS, pt scored 20 out 30 (n=>27) with deficits in recall, error awareness and higher level attention. Pt also demonstrated mild deficits in mildly complex problem solving skills in construction tasks on Cognistat. SLP provided education to pt and pt's daughter need to continued ST services in speech and cognitive skills. SLP provided strategies for recall, noting error awareness and continued use of EMST. All questions answered to satisfaction. Pt was left in room daughter with call bell within reach and chair alarm set. SLP recommends to continue skilled services.    Patient has met 8 of 8 long term goals.  Patient to discharge at overall Supervision;Min level.  Reasons goals not met:     Clinical Impression/Discharge Summary:   Pt made great progress meeting 8 out 8 goals, discharging at mod I for swallow and min-supervision A for cognitive and speech skills. Pt is currently consuming regular textures and thin liquids with mod I use of small bites/sips. No continued dysphagia noted. Pt made great progress in attention, basic problem solving, error awareness and use of speech intelligibility strategies as well as use of EMST. Pt benefited from skilled ST services in order to maximize functional independence and reduce burden of care, requiring 24 hour supervision at discharge with continued skilled ST services.   Care Partner:  Caregiver Able to Provide Assistance: Yes  Type of Caregiver Assistance: Cognitive;Physical  Recommendation:  24 hour supervision/assistance;Skilled Nursing facility  Rationale for SLP  Follow Up: Maximize cognitive function and independence;Reduce caregiver burden;Maximize functional communication   Equipment: N/A   Reasons for discharge: Discharged from hospital   Patient/Family Agrees with Progress Made and Goals Achieved: Yes    Esteban Kobashigawa  [facility] 02/11/2020, 11:29 AM

## 2020-02-11 NOTE — Progress Notes (Signed)
Recreational Therapy Session Note  Patient Details  Name: [name] [name] MRN: [id number] Date of Birth: 04/24/1932 Today's Date: 02/11/2020  Pain: no c/o Skilled Therapeutic Interventions/Progress Updates: Session focused on reinforcing involvement in activities and community programming at [employer].  Pt stated understanding and excitement about discharge.  Pt requires set up assistance for simple seated tasks.    Therapy/Group: Individual Therapy  Starletta Houchin 02/11/2020, 4:02 PM

## 2020-02-11 NOTE — Progress Notes (Addendum)
Patient ID: [name] [name], female   DOB: Oct 22, 1932, 86 y.o.   MRN: [id number]  Working on getting pt prepared for transfer to [facility] tomorrow, paperwork faxed to them and hopefully is all they need and questions from them answered. COVID test tonight and results tomorrow. Family to be here at 10:00 to transport to facility. See then for any last minute questions   3:30 PM Have re-sent the corrected FL2 again.  4:06 pm Re-faxed corrected Fl2 again for clarification

## 2020-02-12 LAB — GLUCOSE, CAPILLARY
Glucose-Capillary: 166 mg/dL — ABNORMAL HIGH (ref 70–99)
Glucose-Capillary: 237 mg/dL — ABNORMAL HIGH (ref 70–99)

## 2020-02-12 MED ORDER — INSULIN SYRINGE-NEEDLE U-100 31G X 5/16" 1 ML MISC
1.0000 | Freq: Two times a day (BID) | 0 refills | Status: AC
Start: 2020-02-12 — End: ?

## 2020-02-12 MED ORDER — DICLOFENAC SODIUM 1 % TD GEL: 4.0000 g | Freq: Four times a day (QID) | TRANSDERMAL | 0 refills | Status: DC

## 2020-02-12 MED ORDER — ACETAMINOPHEN 500 MG PO TABS
500.0000 mg | ORAL_TABLET | Freq: Four times a day (QID) | ORAL | 0 refills | Status: DC | PRN
Start: 2020-02-12 — End: 2021-10-12

## 2020-02-12 MED ORDER — ACETAMINOPHEN 500 MG PO TABS: 500.0000 mg | ORAL_TABLET | Freq: Four times a day (QID) | ORAL | 0 refills | Status: DC | PRN

## 2020-02-12 MED ORDER — SENNOSIDES-DOCUSATE SODIUM 8.6-50 MG PO TABS: 1.0000 | ORAL_TABLET | Freq: Two times a day (BID) | ORAL | 0 refills | Status: DC

## 2020-02-12 MED ORDER — ALBUTEROL SULFATE HFA 108 (90 BASE) MCG/ACT IN AERS: 2.0000 | INHALATION_SPRAY | Freq: Four times a day (QID) | RESPIRATORY_TRACT | 0 refills | Status: DC | PRN

## 2020-02-12 MED ORDER — INSULIN GLARGINE 100 UNIT/ML ~~LOC~~ SOLN: 40.0000 [IU] | Freq: Two times a day (BID) | SUBCUTANEOUS | 11 refills | Status: DC

## 2020-02-12 MED ORDER — METOPROLOL TARTRATE 100 MG PO TABS: 100.0000 mg | ORAL_TABLET | Freq: Two times a day (BID) | ORAL | 0 refills | Status: DC

## 2020-02-12 MED ORDER — QUETIAPINE FUMARATE 25 MG PO TABS: 12.5000 mg | ORAL_TABLET | Freq: Every evening | ORAL | 0 refills | Status: DC | PRN

## 2020-02-12 MED ORDER — HYDRALAZINE HCL 100 MG PO TABS: 100.0000 mg | ORAL_TABLET | Freq: Three times a day (TID) | ORAL | 0 refills | Status: DC

## 2020-02-12 MED ORDER — LIDOCAINE 5 % EX PTCH: MEDICATED_PATCH | CUTANEOUS | 0 refills | Status: DC

## 2020-02-12 MED ORDER — FUROSEMIDE 20 MG PO TABS: 10.0000 mg | ORAL_TABLET | ORAL | 0 refills | Status: DC

## 2020-02-12 MED ORDER — PANTOPRAZOLE SODIUM 40 MG PO TBEC: 40.0000 mg | DELAYED_RELEASE_TABLET | Freq: Every day | ORAL | 0 refills | Status: AC

## 2020-02-12 MED ORDER — AMANTADINE HCL 100 MG PO CAPS: 100.0000 mg | ORAL_CAPSULE | Freq: Every day | ORAL | 0 refills | Status: AC

## 2020-02-12 MED ORDER — "INSULIN SYRINGE-NEEDLE U-100 31G X 5/16"" 1 ML MISC": 1.0000 "application " | Freq: Two times a day (BID) | 0 refills | Status: DC

## 2020-02-12 MED ORDER — POLYETHYLENE GLYCOL 3350 17 G PO PACK: 17.0000 g | PACK | Freq: Two times a day (BID) | ORAL | 0 refills | Status: DC

## 2020-02-12 MED ORDER — CHLORTHALIDONE 25 MG PO TABS: 37.5000 mg | ORAL_TABLET | Freq: Every day | ORAL | 0 refills | Status: DC

## 2020-02-12 NOTE — Progress Notes (Signed)
[facility] PHYSICAL MEDICINE & REHABILITATION PROGRESS NOTE   Subjective/Complaints: Patient seen sitting up in her chair this morning.  Son at bedside.  She states she slept well overnight.  Son is questions regarding discharge medications and results of UA.  She is very appreciative of her care.  ROS: Denies CP, SOB, N/V/D  Objective:   No results found. No results for input(s): WBC, HGB, HCT, PLT in the last 72 hours. No results for input(s): NA, K, CL, CO2, GLUCOSE, BUN, CREATININE, CALCIUM in the last 72 hours.  Intake/Output Summary (Last 24 hours) at 02/12/2020 1257 Last data filed at 02/11/2020 1858 Gross per 24 hour  Intake 240 ml  Output --  Net 240 ml        Physical Exam: Vital Signs Blood pressure (!) 134/39, pulse 66, temperature 98.8 F (37.1 C), temperature source Oral, resp. rate 17, height 5\' 3"  (1.6 m), weight 101.3 kg, SpO2 94 %.  Constitutional: No distress . Vital signs reviewed. HENT: Normocephalic.  Atraumatic. Eyes: EOMI. No discharge. Cardiovascular: No JVD.  RRR. Respiratory: Normal effort.  No stridor.  Bilateral clear to auscultation. GI: Non-distended.  BS +. Skin: Warm and dry.  Intact. Psych: Normal mood.  Normal behavior. Musc: No edema in extremities.  No tenderness in extremities. Neuro: Alert Motor: RUE: 3-3/5 proximal to distal with apraxia, improving RLE: 2+ to 3 -/5 HF, KE, 2+/5 ADF with apraxia, improving  Assessment/Plan: 1. Functional deficits which require 3+ hours per day of interdisciplinary therapy in a comprehensive inpatient rehab setting.  Physiatrist is providing close team supervision and 24 hour management of active medical problems listed below.  Physiatrist and rehab team continue to assess barriers to discharge/monitor patient progress toward functional and medical goals  Care Tool:  Bathing    Body parts bathed by patient: Chest,Abdomen,Right upper leg,Left upper leg,Face,Right arm,Left arm,Right lower  leg,Left lower leg,Front perineal area,Buttocks   Body parts bathed by helper: Right arm,Chest,Abdomen,Right upper leg,Left upper leg,Right lower leg,Left lower leg,Face,Left arm,Front perineal area Body parts n/a: Buttocks   Bathing assist Assist Level: Supervision/Verbal cueing     Upper Body Dressing/Undressing Upper body dressing   What is the patient wearing?: Pull over shirt,Bra    Upper body assist Assist Level: Set up assist    Lower Body Dressing/Undressing Lower body dressing      What is the patient wearing?: Underwear/pull up,Pants     Lower body assist Assist for lower body dressing: Minimal Assistance - Patient > 75%     Toileting Toileting Toileting Activity did not occur (Clothing management and hygiene only): N/A (no void or bm)  Toileting assist Assist for toileting: Minimal Assistance - Patient > 75%     Transfers Chair/bed transfer  Transfers assist     Chair/bed transfer assist level: Minimal Assistance - Patient > 75%     Locomotion Ambulation   Ambulation assist   Ambulation activity did not occur: Safety/medical concerns  Assist level: Moderate Assistance - Patient 50 - 74% Assistive device: Walker-rolling Max distance: 25   Walk 10 feet activity   Assist  Walk 10 feet activity did not occur: Safety/medical concerns  Assist level: Moderate Assistance - Patient - 50 - 74% Assistive device: Walker-rolling   Walk 50 feet activity   Assist Walk 50 feet with 2 turns activity did not occur: Safety/medical concerns         Walk 150 feet activity   Assist Walk 150 feet activity did not occur: Safety/medical concerns  Walk 10 feet on uneven surface  activity   Assist Walk 10 feet on uneven surfaces activity did not occur: Safety/medical concerns         Wheelchair     Assist Will patient use wheelchair at discharge?: Yes Type of Wheelchair: Manual    Wheelchair assist level: Supervision/Verbal  cueing Max wheelchair distance: 150    Wheelchair 50 feet with 2 turns activity    Assist        Assist Level: Supervision/Verbal cueing   Wheelchair 150 feet activity     Assist      Assist Level: Supervision/Verbal cueing   Medical Problem List and Plan: 1. 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  DC today  Will see patient for transitional care management in 1-2 weeks post-discharge  WHO/PRAFO nightly 2. Antithrombotics: -DVT/anticoagulation:Pharmaceutical:Heparin -antiplatelet therapy: N/a 3. Pain Management:Tylenol as needed.   Continue Voltaren gel  Right knee intra-articular steroid injection (supposed to have knee replacement in 02/2020) performed on 12/2  continue kpad and lidocaine patch for lower back with good results.   Controlled with meds on 12/16 4. Mood:LCSW to follow for evaluation and support when appropriate. -antipsychotic agents: N/A  Seroquel prn HS 25mg  per son.   12/12: discussed with son weaning seroquel to 12.5mg  and he is agreeable.   Nursing order for lavender drops on forehead at night.   Amantadine 100 daily on 12/10, wean as outpatient 5. Neuropsych: This patientis notcapable of making decisions on herown behalf. 6. Skin/Wound Care:Routine pressure-relief measures. Maintain adequate nutrition and hydration status. 7. Fluids/Electrolytes/Nutrition:Monitor intake/output--  Will need assistance with feeding as due to fluctuating bouts of lethargy.  8. HTN: Monitor BP --continue Lasix, Hydralazine, Metoprolol  HCTZ changed to chlorthalidone on 11/25 , increased on 12/6            Vitals:   02/11/20 2019 02/12/20 0531  BP: (!) 152/52 (!) 134/39  Pulse: 61 66  Resp: 20 17  Temp: 98.2 F  (36.8 C) 98.8 F (37.1 C)  SpO2: 96% 94%   Controlled for the most part on 12/16 9. Acute on chronic kidney injury: BUN/SCr 34/1.5 at admission.  Decreased lasix to 10mg   Creatinine 1.73 on 12/13, follow-up as outpatient  Encourage fluids 10. PNA?/Tracheobronchitis: Completed course of antibiotics 11. Leukocytosis: Resolved   12. T2DM with hyperglycemia:  Monitor BS ac/hs.   CBG (last 3)  Recent Labs    02/11/20 2115 02/12/20 0549 02/12/20 1119  GLUCAP 291* 166* 237*   Lantus decreased to 48U, decreased to 35 twice daily on 11/23, decreased to 28 on 11/24, increased to 30 twice daily on 11/30, increase to 34 twice daily on 12/2, increased to 36 twice daily on 12/8, increased to 38 on 12/11, increased to 40 on 12/14  Labile on 12/16, monitor in ambulatory setting with potential further adjustments as necessary 13. Post stroke dysphagia :             Advance to regular thins with intermittent supervision, continue to advance diet as tolerated. 14.  Hyperkalemia  Potassium 3.8 on 12/13 15.  Slow transit constipation  Bowel meds increased on 11/28, increased on 12/13  Improving 16. Sleep disturbance  AM lethargy with Trazodone, d/ced  Improved.  17.  Right knee OA  See #3  Improved 18.  Thrombocytopenia  Platelets 168 on 12/13  Continue to monitor   > 30 minutes spent in total in discharge planning between myself and PA regarding aforementioned, as well discussion regarding DME equipment, follow-up appointments, follow-up therapies, discharge medications, discharge recommendations, discharge venue  LOS: 27 days A FACE TO FACE EVALUATION WAS PERFORMED  Tedrick Port Karis Juba 02/12/2020, 12:57 PM

## 2020-02-12 NOTE — Progress Notes (Signed)
Patient ID: [name] [name], female   DOB: September 05, 1932, 86 y.o.   MRN: [id number]  [name] will have medications for pt after lunch so will need to stay here for lunch and then leave. Pt and son on board, bedside RN aware of this.

## 2020-02-12 NOTE — Progress Notes (Signed)
Recreational Therapy Discharge Summary Patient Details  Name: [name] [name] MRN: [id number] Date of Birth: June 06, 1932 Today's Date: 02/12/2020  Long term goals set: 1  Long term goals met: 1  Comments on progress toward goals: Pt has made great progress during LOS and is ready for discharge to ALF today.  TR sessions focused on leisure education, activity analysis identifying potential modifications, coping & discharge planning.  Pt is supervision/set up assist level for simple TR related tasks.  Pt is anxious for discharge and looking forward to making new friends and becoming involved in her new community.  Reasons goals not met: n/a  Equipment acquired:n/a  Reasons for discharge: discharge from hospital  Patient/family agrees with progress made and goals achieved: Yes  [name] 02/12/2020, 9:26 AM

## 2020-02-16 ENCOUNTER — Telehealth: Payer: Self-pay

## 2020-02-16 NOTE — Telephone Encounter (Addendum)
Transitional Care call--[name]-daughter    1. Are you/is patient experiencing any problems since coming home? Yes, blood ran in 400, today 161. Has appt with Dr. Harrel Carina tomorrow Are there any questions regarding any aspect of care? No, excellent care 2. Are there any questions regarding medications administration/dosing? No  Are meds being taken as prescribed? Yes Patient should review meds with caller to confirm 3. Have there been any falls? No 4. Has Home Health been to the house and/or have they contacted you? Outpatient, set up but not started yet If not, have you tried to contact them? Can we help you contact them? 5. Are bowels and bladder emptying properly? Yes Are there any unexpected incontinence issues? No If applicable, is patient following bowel/bladder programs? 6. Any fevers, problems with breathing, unexpected pain? No 7. Are there any skin problems or new areas of breakdown? No 8. Has the patient/family member arranged specialty MD follow up (ie cardiology/neurology/renal/surgical/etc)? Yes  Can we help arrange? 9. Does the patient need any other services or support that we can help arrange? No 10. Are caregivers following through as expected in assisting the patient? Yes 11. Has the patient quit smoking, drinking alcohol, or using drugs as recommended? Yes  Appointment time 1:20 pm, arrive time 1:00 pm with Jacalyn Lefevre on 03/24/2019 then Dr. Allena Katz [address] [phone]

## 2020-03-03 ENCOUNTER — Inpatient Hospital Stay: Payer: Medicare Other | Admitting: Registered Nurse

## 2020-03-11 ENCOUNTER — Inpatient Hospital Stay: Payer: Medicare Other | Admitting: Registered Nurse

## 2020-03-18 ENCOUNTER — Inpatient Hospital Stay: Payer: Medicare Other | Admitting: Adult Health

## 2020-03-23 ENCOUNTER — Other Ambulatory Visit: Payer: Self-pay

## 2020-03-23 ENCOUNTER — Encounter: Payer: Medicare Other | Attending: Registered Nurse | Admitting: Registered Nurse

## 2020-03-23 ENCOUNTER — Other Ambulatory Visit: Payer: Self-pay | Admitting: Physical Medicine and Rehabilitation

## 2020-03-23 ENCOUNTER — Encounter: Payer: Self-pay | Admitting: Registered Nurse

## 2020-03-23 VITALS — BP 162/77 | HR 60 | Temp 98.0°F | Ht 66.0 in | Wt 200.0 lb

## 2020-03-23 DIAGNOSIS — I10 Essential (primary) hypertension: Secondary | ICD-10-CM

## 2020-03-23 DIAGNOSIS — E1165 Type 2 diabetes mellitus with hyperglycemia: Secondary | ICD-10-CM | POA: Diagnosis not present

## 2020-03-23 DIAGNOSIS — I619 Nontraumatic intracerebral hemorrhage, unspecified: Secondary | ICD-10-CM

## 2020-03-23 DIAGNOSIS — M1711 Unilateral primary osteoarthritis, right knee: Secondary | ICD-10-CM | POA: Diagnosis not present

## 2020-03-23 DIAGNOSIS — Z794 Long term (current) use of insulin: Secondary | ICD-10-CM

## 2020-03-23 MED ORDER — QUETIAPINE FUMARATE 25 MG PO TABS
12.5000 mg | ORAL_TABLET | Freq: Every evening | ORAL | 0 refills | Status: AC | PRN
Start: 2020-03-23 — End: ?

## 2020-03-23 NOTE — Progress Notes (Signed)
Subjective:    Patient ID: [name] [name], female    DOB: Apr 29, 1932, 87 y.o.   MRN: [id number]  HPI: [name] [name] is a 87 y.o. female who is here for HFU appointment regarding her Nontraumatic acute hemorrhage of basal ganglia, Essential Hypertension, Primary Osteoarthritis of Right Knee and  Controlled Type 2 Diabetes Mellitus with hyperglycemia with long-term current insulin use. [name] was brought to [facility] via EMS on 01/04/2020 after she collapsed in church during service, while standing. Neurology consulted.  CT Head WO Contrast:  IMPRESSION: 1.3 x 2.7 x 1.5 cm acute parenchymal hemorrhage centered within the left thalamus and basal ganglia. Local mass effect with partial effacement of the third ventricle and 2 mm rightward midline shift at the level of the septum pellucidum.  Mild cerebral atrophy with moderate chronic small vessel ischemic disease.  Known chronic pontine lacunar infarct.  7 mm focus of lobular dural-based calcification versus incidental small calcified meningioma overlying the anterior right frontal lobe.  IMPRESSION: Left thalamic hemorrhage unchanged from earlier today. No new hemorrhage or hydrocephalus.  Moderate chronic microvascular ischemic change in the white matter.  MR Head WO Contrast:  IMPRESSION: 1. No visible mass or vascular lesion underlying the left thalamic hematoma. 2. Extensive chronic small vessel ischemia. 3. Intracranial atherosclerosis.  [name] was admitted to inpatient Rehabilitation on 01/16/2020 and discharged to [facility] on 02/12/2020. She is receiving outpatient therapy with a contracted agency at [facility] per social work discharge summary Lurena Joiner, [place] LCSW.  She states she has pain in her right eye, no drainage noted. She will F/U with her PCP.  Also reports right knee pain requesting cortisone injection, she will be scheduled with Dr Allena Katz, she verbalizes understanding.  She rated her pain on The Health and History 0. Also reports she has a good appetite.    Pain Inventory Average Pain 0 Pain Right Now 0 My pain is no pain  LOCATION OF PAIN  No pain  BOWEL Number of stools per week: 7 Oral laxative use Yes  Type of laxative senna  Enema or suppository use No  History of colostomy No  Incontinent No   BLADDER Normal In and out cath, frequency n/a  Able to self cath n/a Bladder incontinence No  Frequent urination No  Leakage with coughing No  Difficulty starting stream No  Incomplete bladder emptying No    Mobility walk with assistance use a walker ability to climb steps?  no do you drive?  no use a wheelchair needs help with transfers Do you have any goals in this area?  yes  Function retired I need assistance with the following:  dressing, bathing, toileting, meal prep, household duties and shopping  Neuro/Psych weakness trouble walking  Prior Studies hospital f/u  Physicians involved in your care hospital f/u   History reviewed. No pertinent family history. Social History   Socioeconomic History  . Marital status: Widowed    Spouse name: Not on file  . Number of children: Not on file  . Years of education: Not on file  . Highest education level: Not on file  Occupational History  . Not on file  Tobacco Use  . Smoking status: Never Smoker  . Smokeless tobacco: Never Used  Vaping Use  . Vaping Use: Never used  Substance and Sexual Activity  . Alcohol use: Never  . Drug use: Never  . Sexual activity: Not on file  Other Topics Concern  . Not  on file  Social History Narrative  . Not on file   Social Determinants of Health   Financial Resource Strain: Not on file  Food Insecurity: Not on file  Transportation Needs: Not on file  Physical Activity: Not on file  Stress: Not on file  Social Connections: Not on file   Past Surgical History:  Procedure Laterality Date  . CHOLECYSTECTOMY    .  COLONOSCOPY    . ERCP    . PARTIAL HYSTERECTOMY  87years old  . POLYPECTOMY    . UPPER GASTROINTESTINAL ENDOSCOPY    . WRIST FRACTURE SURGERY  left arm,87years old   Past Medical History:  Diagnosis Date  . Colon polyp   . Diabetes mellitus 1992  . Hypertension 1996  . Pancreatitis    BP (!) 176/69   Pulse (!) 57   Temp 98 F (36.7 C)   Ht 5\' 6"  (1.676 m)   Wt 200 lb (90.7 kg)   SpO2 94%   BMI 32.28 kg/m   Opioid Risk Score:   Fall Risk Score:  `1  Depression screen PHQ 2/9  No flowsheet data found.  Review of Systems  Constitutional: Negative.   HENT: Negative.   Eyes: Negative.   Respiratory: Negative.   Cardiovascular: Negative.   Gastrointestinal: Negative.   Endocrine: Negative.        High blood sugar  Genitourinary: Negative.   Musculoskeletal: Positive for gait problem.  Allergic/Immunologic: Negative.   Neurological: Positive for weakness and numbness.  Hematological: Negative.   Psychiatric/Behavioral: Negative.   All other systems reviewed and are negative.      Objective:   Physical Exam Vitals and nursing note reviewed.  Constitutional:      Appearance: Normal appearance.  Cardiovascular:     Rate and Rhythm: Normal rate and regular rhythm.     Pulses: Normal pulses.     Heart sounds: Normal heart sounds.  Pulmonary:     Effort: Pulmonary effort is normal.     Breath sounds: Normal breath sounds.  Musculoskeletal:     Cervical back: Normal range of motion and neck supple.     Comments: Normal Muscle Bulk and Muscle Testing Reveals:  Upper Extremities: Full ROM and Muscle Strength 5/5 Lower Extremities: Full ROM and Muscle Strength 5/5 Arrived in wheelchair  Skin:    General: Skin is warm and dry.  Neurological:     Mental Status: She is alert and oriented to person, place, and time.  Psychiatric:        Mood and Affect: Mood normal.        Behavior: Behavior normal.           Assessment & Plan:  1.Nontraumatic acute  hemorrhage of basal ganglia: Continue Outpatient Therapy.  She has a scheduled appointment with Neurology.  2.Essential Hypertension: Continue current medication regimen. PCP following. Continue to monitor.  3.Controlled Type 2 Diabetes Mellitus with hyperglycemia with long-term current insulin use. PCP following.  4. Primary Osteoarthritis of Right Knee: S/P Cortisone injection on 01/28/2020 with good relief noted. Ms. Kaczmarek requesting Cortisone injection: This will be scheduled with Dr Katrinka Blazing in March, she verbalizes understanding.   F/U in 4-6 weeks with Dr April

## 2020-04-01 ENCOUNTER — Other Ambulatory Visit: Payer: Self-pay | Admitting: Physical Medicine and Rehabilitation

## 2020-04-01 ENCOUNTER — Encounter: Payer: Self-pay | Admitting: Adult Health

## 2020-04-01 ENCOUNTER — Telehealth: Payer: Self-pay | Admitting: Adult Health

## 2020-04-01 ENCOUNTER — Ambulatory Visit: Payer: Medicare Other | Admitting: Adult Health

## 2020-04-01 VITALS — BP 138/62 | HR 58 | Ht 65.0 in

## 2020-04-01 DIAGNOSIS — I610 Nontraumatic intracerebral hemorrhage in hemisphere, subcortical: Secondary | ICD-10-CM

## 2020-04-01 NOTE — Telephone Encounter (Signed)
[employer] medicare order sent to GI. No auth they will reach out to the patient to schedule.  

## 2020-04-01 NOTE — Progress Notes (Signed)
[facility] [address] [place]. [place] [address] [phone]       HOSPITAL FOLLOW UP NOTE  [name] Date of Birth:  [birth year] Medical Record Number:  [id number]   Reason for Referral:  hospital stroke follow up    SUBJECTIVE:   CHIEF COMPLAINT:  Chief Complaint  Patient presents with  . Follow-up    RM 14 with daughter (wanda) PT is well, getting feeling back in R side. Some numbness and swelling in fingers      HPI:   [name] is a 87 y.o. female with history of diabetes, hypertension, congestive heart failure, colon polyps who presented on 01/04/2020 with aphasia and R sided weakness which progressed to lethargy and emesis w/ assymetric pupils. Personally reviewed hospitalization pertinent progress notes, lab work and imaging with summary provided. Stroke work-up revealed left BG ICH likely secondary to hypertension. On aspirin 81 mg PTA and held during admission with recommended outpatient follow-up for repeat imaging and potential restart aspirin. History of HTN with elevated BP during admission with long-term BP goal normotensive range. Unable to calculate LDL due to elevated triglycerides which direct LDL 121 and recommended initiating statin at discharge in setting of acute hemorrhage. Controlled DM with A1c 8.6. Other stroke risk factors include advanced age, morbid obesity and family history of stroke. Hospital course complicated by acute hypoxic and hypercapnic respiratory failure in setting of hemorrhagic stroke, tracheobronchitis vs PNA with leukocytosis and acute encephalopathy on 11/15. Residual deficits of cognitive impairment, mild to moderate dysarthria, dysphagia, right facial weakness, right>left hemiparesis and gait impairment. Evaluated by therapies and recommended discharge to CIR for ongoing. Discharge to CIR on 01/16/2020.  ICH:   L BG ICH likely secondary to hypertension  CT head L thalamic and basal ganglia IPH  w/ local mass effect and partial effacement 3rd ventricle w/ 29mm midline shift. Old pontine lacune. Anterior R frontal lobe meningioma vs calcification.   CT head after intubation unchanged  MRI  No underlying basal ganglia lesion. Extensive small vessel disease.   MRA  Intracranial atherosclerosis.   Carotid Doppler bilateral 1-39% carotid stenosis.  2D Echo EF 55 to 60%.  TG > 400 mg/dL, direct LDL 053.9   JQBH4L 8.6  VTE prophylaxis - Heparin subq  aspirin 81 mg daily prior to admission, now on No antithrombotic given hemorrhage. Can restart ASA after bleed resolution, will need outpatient follow up for repeat imaging.  Therapy recommendations:  CIR  Disposition: CIR   Today, 04/01/2020, [name] [name] is being seen for hospital follow-up accompanied by her daughter. Due to continued deficits, family decided to transition patient to ALF [facility] after CIR discharge.  She continues to reside at ALF and reports residual right hemiparesis and occasional dysarthria.  Denies residual dysphagia.  She has been working with [facility] PT/OT 2x weekly with continued improvement.  Denies new or worsening stroke/TIA symptoms.  Blood pressure today 138/62.  Monitored at ALF and SBP typically 130s.       ROS:   14 system review of systems performed and negative with exception of those listed in HPI  PMH:  Past Medical History:  Diagnosis Date  . Colon polyp   . Diabetes mellitus 1992  . Hypertension 1996  . Pancreatitis     PSH:  Past Surgical History:  Procedure Laterality Date  . CHOLECYSTECTOMY    . COLONOSCOPY    . ERCP    . PARTIAL HYSTERECTOMY  87years old  . POLYPECTOMY    .  UPPER GASTROINTESTINAL ENDOSCOPY    . WRIST FRACTURE SURGERY  left arm,87years old    Social History:  Social History   Socioeconomic History  . Marital status: Widowed    Spouse name: Not on file  . Number of children: Not on file  . Years of education: Not on file  . Highest education level: Not  on file  Occupational History  . Not on file  Tobacco Use  . Smoking status: Never Smoker  . Smokeless tobacco: Never Used  Vaping Use  . Vaping Use: Never used  Substance and Sexual Activity  . Alcohol use: Never  . Drug use: Never  . Sexual activity: Not on file  Other Topics Concern  . Not on file  Social History Narrative  . Not on file   Social Determinants of Health   Financial Resource Strain: Not on file  Food Insecurity: Not on file  Transportation Needs: Not on file  Physical Activity: Not on file  Stress: Not on file  Social Connections: Not on file  Intimate Partner Violence: Not on file    Family History: History reviewed. No pertinent family history.  Medications:   Current Outpatient Medications on File Prior to Visit  Medication Sig Dispense Refill  . acetaminophen (TYLENOL) 500 MG tablet Take 1 tablet (500 mg total) by mouth every 6 (six) hours as needed for mild pain. 30 tablet 0  . albuterol (VENTOLIN HFA) 108 (90 Base) MCG/ACT inhaler Inhale 2 puffs into the lungs every 6 (six) hours as needed for wheezing or shortness of breath. 8 g 0  . amantadine (SYMMETREL) 100 MG capsule Take 1 capsule (100 mg total) by mouth daily. 30 capsule 0  . chlorthalidone (HYGROTON) 25 MG tablet Take 1.5 tablets (37.5 mg total) by mouth daily. 30 tablet 0  . diclofenac sodium (VOLTAREN) 1 % GEL Apply 4 g topically 4 (four) times daily. 100 g 0  . furosemide (LASIX) 20 MG tablet Take 0.5 tablets (10 mg total) by mouth See admin instructions. 30 tablet 0  . hydrALAZINE (APRESOLINE) 100 MG tablet Take 1 tablet (100 mg total) by mouth 3 (three) times daily. 90 tablet 0  . insulin glargine (LANTUS) 100 UNIT/ML injection Inject 0.4 mLs (40 Units total) into the skin 2 (two) times daily. 10 mL 11  . Insulin Syringe-Needle U-100 31G X 5/16" 1 ML MISC 1 application by Does not apply route 2 (two) times daily. 100 each 0  . lidocaine (LIDODERM) 5 % Apply to lower back at 8 am and  remove at 8 pm daily. 30 patch 0  . metoprolol tartrate (LOPRESSOR) 100 MG tablet Take 1 tablet (100 mg total) by mouth 2 (two) times daily. 60 tablet 0  . pantoprazole (PROTONIX) 40 MG tablet Take 1 tablet (40 mg total) by mouth daily. 30 tablet 0  . polyethylene glycol (MIRALAX / GLYCOLAX) 17 g packet Take 17 g by mouth 2 (two) times daily. 60 each 0  . QUEtiapine (SEROQUEL) 25 MG tablet Take 0.5 tablets (12.5 mg total) by mouth at bedtime as needed (anxiety). 15 tablet 0  . senna-docusate (SENOKOT-S) 8.6-50 MG tablet Take 1 tablet by mouth 2 (two) times daily. 60 tablet 0   No current facility-administered medications on file prior to visit.    Allergies:   Allergies  Allergen Reactions  . Amlodipine Other (See Comments)    EDEMA  . Ace Inhibitors Cough  . Canagliflozin     INVOKANA  . Morphine And Related Rash  and Other (See Comments)    For long periods of time (??)      OBJECTIVE:  Physical Exam  Vitals:   04/01/20 1451  Height: 5\' 5"  (1.651 m)   Body mass index is 33.28 kg/m. No exam data present   Post stroke PHQ 2/9 Depression screen PHQ 2/9 03/23/2020  Decreased Interest 0  Down, Depressed, Hopeless 0  PHQ - 2 Score 0  Altered sleeping 0  Tired, decreased energy 0  Change in appetite 0  Feeling bad or failure about yourself  0  Trouble concentrating 0  Moving slowly or fidgety/restless 0  Suicidal thoughts 0  PHQ-9 Score 0     General: well developed, well nourished,  very pleasant elderly Caucasian female, seated, in no evident distress Head: head normocephalic and atraumatic.   Neck: supple with no carotid or supraclavicular bruits Cardiovascular: regular rate and rhythm, no murmurs Musculoskeletal: no deformity Skin:  no rash/petichiae Vascular:  Normal pulses all extremities   Neurologic Exam Mental Status: Awake and fully alert.  Fluent speech and language. Oriented to place and time. Recent and remote memory intact. Attention span,  concentration and fund of knowledge appropriate. Mood and affect appropriate.  Cranial Nerves: Fundoscopic exam reveals sharp disc margins. Pupils equal, briskly reactive to light. Extraocular movements full without nystagmus. Visual fields full to confrontation. Hearing intact. Facial sensation intact.  Right nasolabial fold flattening.  Tongue and palate moves normally and symmetrically.  Motor: Normal bulk and tone. Normal strength left upper and lower extremity.  RUE: 4+/5 with decreased hand dexterity and decreased shoulder ROM 2/2 pain; RLE: 4/5 hip flexor and ankle dorsiflexion (limited 2/2 knee pain) Sensory.: intact to touch , pinprick , position and vibratory sensation.  Coordination: Rapid alternating movements slightly decreased right hand. Finger-to-nose and heel-to-shin performed accurately on left side. Gait and Station: Deferred Reflexes: 1+ and symmetric. Toes downgoing.     NIHSS  1 Modified Rankin  3-4      ASSESSMENT: [name] [name] is a 87 y.o. year old female presented with aphasia and right-sided weakness which progressed to lethargy and emesis w/ asymmetric pupils on 01/04/2020 with stroke work-up revealing left basal ganglia ICH likely secondary to hypertension. Vascular risk factors include HTN, HLD, DM, morbid obesity and advanced age.      PLAN:  1. L BG ICH, hypertensive:  a. Residual deficit: Right hemiparesis, gait impairment and occasional dysarthria.  Encouraged continued participation with [facility] PT/OT for hopeful ongoing improvement b. Repeat CT head for resolution of ICH -if resolved, would recommend restarting aspirin 81 mg daily and statin for secondary stroke prevention c. Discussed secondary stroke prevention measures and importance of close PCP follow up for aggressive stroke risk factor management  2. HTN: BP goal <130/90.  Stable on furosemide, hydralazine and metoprolol per PCP 3. HLD: LDL goal <70. Recent direct LDL 121.8 on pravastatin 20 mg  daily but held in setting of ICH 4. DMII: A1c goal<7.0. Recent A1c 8.6.  Remains on insulin glargine per PCP    Follow up in 3 months or call earlier if needed  CC:  [facility] provider: Dr. Selmer Dominion, MD    I spent 45 minutes of face-to-face and non-face-to-face time with patient and daughter.  This included previsit chart review including recent hospitalization pertinent progress notes, lab work and imaging, lab review, study review, order entry, electronic health record documentation, patient education regarding recent stroke including etiology, residual deficits, importance of managing stroke risk factors and answered  all other questions to patient and daughters satisfaction   Ihor Austin, [facility]  [facility] [address] [place], [place] [address]  Phone [phone] Fax [phone] Note: This document was prepared with digital dictation and possible smart phrase technology. Any transcriptional errors that result from this process are unintentional.

## 2020-04-01 NOTE — Patient Instructions (Signed)
Continue working with therapies for likely ongoing recovery   Repeat CT scan to look for resolution of bleed - if resolved, we will plan on restarting aspirin and pravastatin  Continue to follow up with PCP regarding cholesterol, blood pressure and diabetes management  Maintain strict control of hypertension with blood pressure goal below 130/90, diabetes with hemoglobin A1c goal below 7% and cholesterol with LDL cholesterol (bad cholesterol) goal below 70 mg/dL.       Followup in the future with me in 3 months or call earlier if needed       Thank you for coming to see [place] at [facility]. I hope we have been able to provide you high quality care today.  You may receive a patient satisfaction survey over the next few weeks. We would appreciate your feedback and comments so that we may continue to improve ourselves and the health of our patients.

## 2020-04-05 ENCOUNTER — Encounter: Payer: Self-pay | Admitting: Adult Health

## 2020-04-06 NOTE — Progress Notes (Signed)
I agree with the above plan 

## 2020-04-08 ENCOUNTER — Ambulatory Visit
Admission: RE | Admit: 2020-04-08 | Discharge: 2020-04-08 | Disposition: A | Discharge status: Home / Self Care | Payer: Medicare Other | Source: Ambulatory Visit | Attending: Adult Health | Admitting: Adult Health

## 2020-04-08 ENCOUNTER — Other Ambulatory Visit: Payer: Self-pay

## 2020-04-12 ENCOUNTER — Telehealth: Payer: Self-pay

## 2020-04-12 ENCOUNTER — Other Ambulatory Visit: Payer: Self-pay | Admitting: Adult Health

## 2020-04-12 ENCOUNTER — Telehealth: Payer: Self-pay | Admitting: *Deleted

## 2020-04-12 MED ORDER — ASPIRIN EC 81 MG PO TBEC: 81.0000 mg | DELAYED_RELEASE_TABLET | Freq: Every day | ORAL | 11 refills | Status: DC

## 2020-04-12 NOTE — Telephone Encounter (Signed)
I called and LMVM for [name] [name] to return call for results and then also spoke with [facility] [phone]  CT ICH resolved and start aspirin.  Spoke to stephanie, and pt is her resident.  Needs order for this faxed to her [phone].

## 2020-04-12 NOTE — Telephone Encounter (Signed)
-----   Message from Ihor Austin, NP sent at 04/12/2020  8:18 AM EST ----- Please advise patient/ ALF that repeat CT head showed resolution of prior ICH -recommend restarting aspirin 81 mg daily for secondary stroke prevention (this was discussed at prior visit)

## 2020-04-12 NOTE — Telephone Encounter (Signed)
Patients daughter called back returning your call from earlier today. Is requesting a call back.  Thank you

## 2020-04-13 MED ORDER — ATORVASTATIN CALCIUM 40 MG PO TABS: 40.0000 mg | ORAL_TABLET | Freq: Every day | ORAL | 0 refills | Status: DC

## 2020-04-13 MED ORDER — ATORVASTATIN CALCIUM 40 MG PO TABS
40.0000 mg | ORAL_TABLET | Freq: Every day | ORAL | 0 refills | Status: AC
Start: 2020-04-13 — End: ?

## 2020-04-13 NOTE — Telephone Encounter (Signed)
Spoke to daughter and let her know of the atorvastatin 40mg  po daily. #90. Will fax to [facility].  She will call and cancel at [facility].  Refills come from pcp, Dr. 01-18-1983.

## 2020-04-13 NOTE — Telephone Encounter (Signed)
I called daughter.  I relayed the CT results to her she mentioned that atorvastatin was another medication that was mentioned when in the office.  I relayed that we can fill and then will be taken over by pcp (has appt with them 05-11-20.

## 2020-04-13 NOTE — Telephone Encounter (Signed)
Order faxed to [facility].  [phone] received fax confirmation.

## 2020-04-13 NOTE — Addendum Note (Signed)
Addended by: Guy Begin on: 04/13/2020 01:14 PM   Modules accepted: Orders

## 2020-04-13 NOTE — Addendum Note (Signed)
Addended by: Raliegh Ip on: 04/13/2020 12:23 PM   Modules accepted: Orders

## 2020-04-19 NOTE — Telephone Encounter (Signed)
Fax confirmation received 04-16-19 [phone].

## 2020-04-29 ENCOUNTER — Ambulatory Visit: Payer: Medicare Other | Admitting: Physical Medicine & Rehabilitation

## 2020-05-06 ENCOUNTER — Encounter: Payer: Medicare Other | Attending: Registered Nurse | Admitting: Physical Medicine & Rehabilitation

## 2020-05-06 ENCOUNTER — Other Ambulatory Visit: Payer: Self-pay

## 2020-05-06 ENCOUNTER — Encounter: Payer: Self-pay | Admitting: Physical Medicine & Rehabilitation

## 2020-05-06 VITALS — BP 172/74 | HR 62 | Temp 98.1°F | Ht 65.0 in | Wt 200.0 lb

## 2020-05-06 DIAGNOSIS — M1711 Unilateral primary osteoarthritis, right knee: Secondary | ICD-10-CM | POA: Diagnosis not present

## 2020-05-06 NOTE — Progress Notes (Signed)
Ultrasound guided intraarticular right  knee injection   Indication: Knee pain not relieved by medication management and other conservative care.   Informed consent was obtained after describing risks and benefits of the procedure with the patient, this includes bleeding, bruising, infection and medication side effects. The patient wishes to proceed and has given written consent. Patient was placed in decubitus position with the right knee slightly flexed. The right knee was marked and prepped with betadine superior to the TF tendon. The ultrasound tranducer was placed in long axis at the patellafemoral junction, visualizing the suprapatellar bursa.  The transducer was then changed to short axis view, keeping the suprapatellar bursa in view. Vapocoolant spray was applied.  A 22-gauge 3-1/8 inch echobloc needle was inserted into the suprapatellar bursa. After negative draw back for blood, a solution 6cc of Synvisc One solution was injected. A band aid was applied. The patient tolerated the procedure well. PROM and AROM was performed on the knee.  Post procedure instructions were given to refrain from excessive activity to the knee for 48 hours.

## 2020-05-25 ENCOUNTER — Telehealth: Payer: Self-pay | Admitting: *Deleted

## 2020-05-25 ENCOUNTER — Other Ambulatory Visit: Payer: Self-pay | Admitting: Family Medicine

## 2020-05-25 DIAGNOSIS — Z1231 Encounter for screening mammogram for malignant neoplasm of breast: Secondary | ICD-10-CM

## 2020-05-25 NOTE — Telephone Encounter (Signed)
Ms [name], [name] [name] called and says that the PT rehab is asking if [name] [name] can come back in prior to her 06/24/20 appt to get another shot in her knee.  She is still not able to walk with proper weight bearing .  Please advise.

## 2020-05-26 NOTE — Telephone Encounter (Signed)
We can schedule her for next available Zilretta with [facility] Thanks.

## 2020-06-14 ENCOUNTER — Other Ambulatory Visit: Payer: Self-pay | Admitting: Adult Health

## 2020-06-15 ENCOUNTER — Other Ambulatory Visit: Payer: Self-pay | Admitting: Internal Medicine

## 2020-06-24 ENCOUNTER — Encounter: Payer: Medicare Other | Admitting: Physical Medicine & Rehabilitation

## 2020-07-02 ENCOUNTER — Telehealth: Payer: Self-pay | Admitting: *Deleted

## 2020-07-02 NOTE — Telephone Encounter (Signed)
[name] [name] from [facility] called and is asking if the appt scheduled for 07/05/20 with Dr Allena Katz is medically necessary.  [name] [name] is in isolation for + covid 19.  Please advise.

## 2020-07-02 NOTE — Telephone Encounter (Signed)
Thank you :)

## 2020-07-02 NOTE — Telephone Encounter (Signed)
If she is still in the quarantine period, then she should not come.

## 2020-07-02 NOTE — Telephone Encounter (Signed)
I called them back and she said Monday will actually be her release from quarantine, so she will be here unless symptomatic.

## 2020-07-05 ENCOUNTER — Encounter: Payer: Self-pay | Admitting: Physical Medicine & Rehabilitation

## 2020-07-05 ENCOUNTER — Other Ambulatory Visit: Payer: Self-pay

## 2020-07-05 ENCOUNTER — Encounter: Payer: Medicare Other | Attending: Registered Nurse | Admitting: Physical Medicine & Rehabilitation

## 2020-07-05 VITALS — BP 185/75 | HR 58 | Temp 98.0°F | Ht 65.0 in | Wt 200.0 lb

## 2020-07-05 DIAGNOSIS — M1711 Unilateral primary osteoarthritis, right knee: Secondary | ICD-10-CM | POA: Insufficient documentation

## 2020-07-05 NOTE — Progress Notes (Signed)
Ultrasound guided intraarticular right knee injection  Indication: Knee pain not relieved by medication management and other conservative care.  Informed consent was obtained after describing risks and benefits of the procedure with the patient, this includes bleeding, bruising, infection and medication side effects. The patient wishes to proceed and has given written consent. Patient was placed sitting up with the right knee slightly flexed. The right knee was marked and prepped with betadine superior to the TF tendon. The ultrasound tranducer was placed in long axis at the patellafemoral junction, visualizing the suprapatellar bursa.  The transducer was then changed to short axis view, keeping the suprapatellar bursa in view. Vapocoolant spray was applied.  A 22-gauge 2 inch needle was inserted into the suprapatellar bursa. 5 cc of serosanguinous fluid was removed.  After negative draw back for blood, a solution Zilretta, 5ccs was injected. A band aid was applied. The patient tolerated the procedure well. Post procedure instructions were given.

## 2020-07-15 ENCOUNTER — Other Ambulatory Visit: Payer: Self-pay

## 2020-07-15 ENCOUNTER — Ambulatory Visit
Admission: RE | Admit: 2020-07-15 | Discharge: 2020-07-15 | Disposition: A | Discharge status: Home / Self Care | Payer: Medicare Other | Source: Ambulatory Visit | Attending: Family Medicine | Admitting: Family Medicine

## 2020-07-15 DIAGNOSIS — Z1231 Encounter for screening mammogram for malignant neoplasm of breast: Secondary | ICD-10-CM

## 2020-07-29 ENCOUNTER — Encounter: Payer: Self-pay | Admitting: Adult Health

## 2020-07-29 ENCOUNTER — Other Ambulatory Visit: Payer: Self-pay

## 2020-07-29 ENCOUNTER — Ambulatory Visit: Payer: Medicare Other | Admitting: Adult Health

## 2020-07-29 VITALS — BP 160/58 | HR 58

## 2020-07-29 DIAGNOSIS — I10 Essential (primary) hypertension: Secondary | ICD-10-CM | POA: Diagnosis not present

## 2020-07-29 DIAGNOSIS — I610 Nontraumatic intracerebral hemorrhage in hemisphere, subcortical: Secondary | ICD-10-CM

## 2020-07-29 DIAGNOSIS — E785 Hyperlipidemia, unspecified: Secondary | ICD-10-CM | POA: Diagnosis not present

## 2020-07-29 DIAGNOSIS — E1165 Type 2 diabetes mellitus with hyperglycemia: Secondary | ICD-10-CM

## 2020-07-29 DIAGNOSIS — Z794 Long term (current) use of insulin: Secondary | ICD-10-CM

## 2020-07-29 NOTE — Progress Notes (Signed)
[facility] [address] [place]. [place] [address] [phone]       STROKE FOLLOW UP NOTE  [name] Date of Birth:  11-06-1932 Medical Record Number:  [id number]   Reason for Referral: stroke follow up    SUBJECTIVE:   CHIEF COMPLAINT:  Chief Complaint  Patient presents with  . Follow-up    TR with daughter wanda  Pt is well, she is making progress. Getting feelings are coming back in her hands and feet.     HPI:   Today, 07/29/2020, [name] returns for 96-month stroke follow-up accompanied by her daughter, [name] [name].  She continues to reside at [facility] living.  She has been doing well since prior visit with continued improvement of right hemiparesis.  Denies new stroke/TIA symptoms.  Repeat CT head showed resolution of ICH therefore aspirin and atorvastatin restarted without any associated side effects.  Blood pressure today 173/70 and on repeat 160/58 but does report not taking her afternoon hydralazine prior to leaving.  Routinely monitored at facility and typically stable.  Continues to follow with endocrinology for DM management.  No further concerns at this time   History provided for reference purposes only Initial visit 04/01/2020 JM: [name] is being seen for hospital follow-up accompanied by her daughter. Due to continued deficits, family decided to transition patient to ALF [facility] after CIR discharge.  She continues to reside at ALF and reports residual right hemiparesis and occasional dysarthria.  Denies residual dysphagia.  She has been working with [facility] PT/OT 2x weekly with continued improvement.  Denies new or worsening stroke/TIA symptoms.  Blood pressure today 138/62.  Monitored at ALF and SBP typically 130s.  Stroke admission 01/04/2020 [name] is a 87 y.o. female with history of diabetes, hypertension, congestive heart failure, colon polyps who presented on 01/04/2020 with aphasia and R sided weakness which  progressed to lethargy and emesis w/ assymetric pupils. Personally reviewed hospitalization pertinent progress notes, lab work and imaging with summary provided. Stroke work-up revealed left BG ICH likely secondary to hypertension. On aspirin 81 mg PTA and held during admission with recommended outpatient follow-up for repeat imaging and potential restart aspirin. History of HTN with elevated BP during admission with long-term BP goal normotensive range. Unable to calculate LDL due to elevated triglycerides which direct LDL 121 and recommended initiating statin at discharge in setting of acute hemorrhage. Controlled DM with A1c 8.6. Other stroke risk factors include advanced age, morbid obesity and family history of stroke. Hospital course complicated by acute hypoxic and hypercapnic respiratory failure in setting of hemorrhagic stroke, tracheobronchitis vs PNA with leukocytosis and acute encephalopathy on 11/15. Residual deficits of cognitive impairment, mild to moderate dysarthria, dysphagia, right facial weakness, right>left hemiparesis and gait impairment. Evaluated by therapies and recommended discharge to CIR for ongoing. Discharge to CIR on 01/16/2020.  ICH:   L BG ICH likely secondary to hypertension  CT head L thalamic and basal ganglia IPH w/ local mass effect and partial effacement 3rd ventricle w/ 65mm midline shift. Old pontine lacune. Anterior R frontal lobe meningioma vs calcification.   CT head after intubation unchanged  MRI  No underlying basal ganglia lesion. Extensive small vessel disease.   MRA  Intracranial atherosclerosis.   Carotid Doppler bilateral 1-39% carotid stenosis.  2D Echo EF 55 to 60%.  TG > 400 mg/dL, direct LDL 622.6   JFHL4T 8.6  VTE prophylaxis - Heparin subq  aspirin 81 mg daily prior to  admission, now on No antithrombotic given hemorrhage. Can restart ASA after bleed resolution, will need outpatient follow up for repeat imaging.  Therapy recommendations:   CIR  Disposition: CIR      ROS:   14 system review of systems performed and negative with exception of those listed in HPI  PMH:  Past Medical History:  Diagnosis Date  . Colon polyp   . Diabetes mellitus 1992  . Hypertension 1996  . Pancreatitis     PSH:  Past Surgical History:  Procedure Laterality Date  . CHOLECYSTECTOMY    . COLONOSCOPY    . ERCP    . PARTIAL HYSTERECTOMY  87years old  . POLYPECTOMY    . UPPER GASTROINTESTINAL ENDOSCOPY    . WRIST FRACTURE SURGERY  left arm,87years old    Social History:  Social History   Socioeconomic History  . Marital status: Widowed    Spouse name: Not on file  . Number of children: Not on file  . Years of education: Not on file  . Highest education level: Not on file  Occupational History  . Not on file  Tobacco Use  . Smoking status: Never Smoker  . Smokeless tobacco: Never Used  Vaping Use  . Vaping Use: Never used  Substance and Sexual Activity  . Alcohol use: Never  . Drug use: Never  . Sexual activity: Not on file  Other Topics Concern  . Not on file  Social History Narrative  . Not on file   Social Determinants of Health   Financial Resource Strain: Not on file  Food Insecurity: Not on file  Transportation Needs: Not on file  Physical Activity: Not on file  Stress: Not on file  Social Connections: Not on file  Intimate Partner Violence: Not on file    Family History: History reviewed. No pertinent family history.  Medications:   Current Outpatient Medications on File Prior to Visit  Medication Sig Dispense Refill  . acetaminophen (TYLENOL) 500 MG tablet Take 1 tablet (500 mg total) by mouth every 6 (six) hours as needed for mild pain. 30 tablet 0  . albuterol (VENTOLIN HFA) 108 (90 Base) MCG/ACT inhaler Inhale 2 puffs into the lungs every 6 (six) hours as needed for wheezing or shortness of breath. 8 g 0  . amantadine (SYMMETREL) 100 MG capsule Take 1 capsule (100 mg total) by mouth daily. 30  capsule 0  . aspirin EC 81 MG tablet Take 1 tablet (81 mg total) by mouth daily. Swallow whole. 30 tablet 11  . atorvastatin (LIPITOR) 40 MG tablet Take 1 tablet (40 mg total) by mouth daily. 90 tablet 0  . chlorthalidone (HYGROTON) 25 MG tablet Take 1.5 tablets (37.5 mg total) by mouth daily. 30 tablet 0  . diclofenac sodium (VOLTAREN) 1 % GEL Apply 4 g topically 4 (four) times daily. 100 g 0  . furosemide (LASIX) 20 MG tablet Take 0.5 tablets (10 mg total) by mouth See admin instructions. 30 tablet 0  . hydrALAZINE (APRESOLINE) 100 MG tablet Take 1 tablet (100 mg total) by mouth 3 (three) times daily. 90 tablet 0  . insulin glargine (LANTUS) 100 UNIT/ML injection Inject 0.4 mLs (40 Units total) into the skin 2 (two) times daily. 10 mL 11  . insulin lispro (HUMALOG) 100 UNIT/ML injection Inject into the skin 3 (three) times daily before meals.    . Insulin Syringe-Needle U-100 31G X 5/16" 1 ML MISC 1 application by Does not apply route 2 (two) times daily. 100  each 0  . lidocaine (LIDODERM) 5 % Apply to lower back at 8 am and remove at 8 pm daily. 30 patch 0  . magnesium oxide (MAG-OX) 400 MG tablet Take 400 mg by mouth daily.    . metoprolol tartrate (LOPRESSOR) 100 MG tablet Take 1 tablet (100 mg total) by mouth 2 (two) times daily. 60 tablet 0  . pantoprazole (PROTONIX) 40 MG tablet Take 1 tablet (40 mg total) by mouth daily. 30 tablet 0  . polyethylene glycol (MIRALAX / GLYCOLAX) 17 g packet Take 17 g by mouth 2 (two) times daily. 60 each 0  . QUEtiapine (SEROQUEL) 25 MG tablet Take 0.5 tablets (12.5 mg total) by mouth at bedtime as needed (anxiety). 15 tablet 0  . senna-docusate (SENOKOT-S) 8.6-50 MG tablet Take 1 tablet by mouth 2 (two) times daily. 60 tablet 0   No current facility-administered medications on file prior to visit.    Allergies:   Allergies  Allergen Reactions  . Amlodipine Other (See Comments)    EDEMA  . Ace Inhibitors Cough    Other reaction(s): cough  .  Atorvastatin     Other reaction(s): muscle & joint aches, Other  . Canagliflozin     INVOKANA Other reaction(s): urinary tract infections  . Clonidine Hcl     Other reaction(s): Other, Unknown  . Amlodipine Besylate     Other reaction(s): Unknown  . Morphine Sulfate     Other reaction(s): rash  . Morphine And Related Rash and Other (See Comments)    For long periods of time (??)      OBJECTIVE:  Physical Exam  Vitals:   07/29/20 1336  BP: (!) 173/70  Pulse: (!) 58   There is no height or weight on file to calculate BMI. No exam data present    General: well developed, well nourished,  very pleasant elderly Caucasian female, seated, in no evident distress Head: head normocephalic and atraumatic.   Neck: supple with no carotid or supraclavicular bruits Cardiovascular: regular rate and rhythm, no murmurs Musculoskeletal: no deformity Skin:  no rash/petichiae Vascular:  Normal pulses all extremities   Neurologic Exam Mental Status: Awake and fully alert.  Fluent speech and language. Oriented to place and time. Recent and remote memory intact. Attention span, concentration and fund of knowledge appropriate. Mood and affect appropriate.  Cranial Nerves: Pupils equal, briskly reactive to light. Extraocular movements full without nystagmus. Visual fields full to confrontation. Hearing intact. Facial sensation intact.  Right nasolabial fold flattening.  Tongue and palate moves normally and symmetrically.  Motor: Normal bulk and tone. Normal strength left upper and lower extremity.  RUE: 4+/5 with decreased hand dexterity and grip with mild increased tone in fingers; RLE: 4+/5 although slightly limited testing due to knee pain Sensory.: intact to touch , pinprick , position and vibratory sensation.  Coordination: Rapid alternating movements slightly decreased right hand. Finger-to-nose and heel-to-shin performed accurately on left side. Gait and Station: Deferred Reflexes: 1+ and  symmetric. Toes downgoing.       ASSESSMENT: [name] [name] is a 87 y.o. year old female presented with aphasia and right-sided weakness which progressed to lethargy and emesis w/ asymmetric pupils on 01/04/2020 with stroke work-up revealing left basal ganglia ICH likely secondary to hypertension. Vascular risk factors include HTN, HLD, DM, morbid obesity and advanced age.      PLAN:  1. L BG ICH, hypertensive:  a. Residual deficit: Right hemiparesis, gait impairment and occasional dysarthria.  Encouraged continued participation with  HH PT/OT for hopeful ongoing improvement b. Continue aspirin and atorvastatin for secondary stroke prevention c. Repeat CT head showed resolution of ICH d. Discussed secondary stroke prevention measures and importance of close PCP follow up for aggressive stroke risk factor management  2. HTN: BP goal <130/90.  Elevated today but typically stable per patient on current regimen per PCP 3. HLD: LDL goal <70.  On atorvastatin 40 mg daily -request ongoing prescribing and routine monitoring per PCP 4. DMII: A1c goal<7.0. stable per patient report -followed by endocrinology    Follow up in 6 months or call earlier if needed  CC:  [facility] provider: Dr. Selmer Dominion, MD    I spent 37 minutes of face-to-face and non-face-to-face time with patient and daughter.  This included previsit chart review, lab review, study review, electronic health record documentation, and patient and daughter education regarding prior stroke, residual deficits, education regarding secondary stroke prevention measures and importance of managing stroke risk factors and answered all other questions to patient and daughters satisfaction  Ihor Austin, [facility]  [facility] [address] [place], [place] [address]  Phone [phone] Fax [phone] Note: This document was prepared with digital dictation and possible smart phrase technology. Any  transcriptional errors that result from this process are unintentional.

## 2020-07-29 NOTE — Patient Instructions (Addendum)
Continue working with therapies for hopeful further recovery  Continue aspirin 81 mg daily  and atorvastatin for secondary stroke prevention  Continue to follow up with PCP regarding cholesterol, blood pressure and diabetes management  Maintain strict control of hypertension with blood pressure goal below 130/90, diabetes with hemoglobin A1c goal below 7 % and cholesterol with LDL cholesterol (bad cholesterol) goal below 70 mg/dL.       Followup in the future with me in 6 months or call earlier if needed        Thank you for coming to see [place] at [facility]. I hope we have been able to provide you high quality care today.  You may receive a patient satisfaction survey over the next few weeks. We would appreciate your feedback and comments so that we may continue to improve ourselves and the health of our patients.

## 2020-08-02 NOTE — Progress Notes (Signed)
I agree with the above plan 

## 2020-08-27 ENCOUNTER — Emergency Department (HOSPITAL_BASED_OUTPATIENT_CLINIC_OR_DEPARTMENT_OTHER)
Admission: EM | Admit: 2020-08-27 | Discharge: 2020-08-27 | Disposition: A | Discharge status: Home / Self Care | Payer: Medicare Other | Attending: Emergency Medicine | Admitting: Emergency Medicine

## 2020-08-27 ENCOUNTER — Other Ambulatory Visit: Payer: Self-pay

## 2020-08-27 ENCOUNTER — Emergency Department (HOSPITAL_BASED_OUTPATIENT_CLINIC_OR_DEPARTMENT_OTHER): Payer: Medicare Other

## 2020-08-27 ENCOUNTER — Encounter (HOSPITAL_BASED_OUTPATIENT_CLINIC_OR_DEPARTMENT_OTHER): Payer: Self-pay | Admitting: Emergency Medicine

## 2020-08-27 DIAGNOSIS — Z7982 Long term (current) use of aspirin: Secondary | ICD-10-CM | POA: Diagnosis not present

## 2020-08-27 DIAGNOSIS — E1169 Type 2 diabetes mellitus with other specified complication: Secondary | ICD-10-CM | POA: Diagnosis not present

## 2020-08-27 DIAGNOSIS — R2241 Localized swelling, mass and lump, right lower limb: Secondary | ICD-10-CM | POA: Diagnosis not present

## 2020-08-27 DIAGNOSIS — Z79899 Other long term (current) drug therapy: Secondary | ICD-10-CM | POA: Diagnosis not present

## 2020-08-27 DIAGNOSIS — N183 Chronic kidney disease, stage 3 unspecified: Secondary | ICD-10-CM | POA: Insufficient documentation

## 2020-08-27 DIAGNOSIS — E1122 Type 2 diabetes mellitus with diabetic chronic kidney disease: Secondary | ICD-10-CM | POA: Insufficient documentation

## 2020-08-27 DIAGNOSIS — E113292 Type 2 diabetes mellitus with mild nonproliferative diabetic retinopathy without macular edema, left eye: Secondary | ICD-10-CM | POA: Diagnosis not present

## 2020-08-27 DIAGNOSIS — I130 Hypertensive heart and chronic kidney disease with heart failure and stage 1 through stage 4 chronic kidney disease, or unspecified chronic kidney disease: Secondary | ICD-10-CM | POA: Diagnosis not present

## 2020-08-27 DIAGNOSIS — Z794 Long term (current) use of insulin: Secondary | ICD-10-CM | POA: Diagnosis not present

## 2020-08-27 DIAGNOSIS — I5032 Chronic diastolic (congestive) heart failure: Secondary | ICD-10-CM | POA: Insufficient documentation

## 2020-08-27 DIAGNOSIS — M7989 Other specified soft tissue disorders: Secondary | ICD-10-CM

## 2020-08-27 DIAGNOSIS — E785 Hyperlipidemia, unspecified: Secondary | ICD-10-CM | POA: Insufficient documentation

## 2020-08-27 LAB — CBC WITH DIFFERENTIAL/PLATELET
Abs Immature Granulocytes: 0.03 10*3/uL (ref 0.00–0.07)
Basophils Absolute: 0.1 10*3/uL (ref 0.0–0.1)
Basophils Relative: 1 %
Eosinophils Absolute: 0.2 10*3/uL (ref 0.0–0.5)
Eosinophils Relative: 2 %
HCT: 43.4 % (ref 36.0–46.0)
Hemoglobin: 14.1 g/dL (ref 12.0–15.0)
Immature Granulocytes: 0 %
Lymphocytes Relative: 22 %
Lymphs Abs: 2.3 10*3/uL (ref 0.7–4.0)
MCH: 29.6 pg (ref 26.0–34.0)
MCHC: 32.5 g/dL (ref 30.0–36.0)
MCV: 91.2 fL (ref 80.0–100.0)
Monocytes Absolute: 0.8 10*3/uL (ref 0.1–1.0)
Monocytes Relative: 8 %
Neutro Abs: 6.8 10*3/uL (ref 1.7–7.7)
Neutrophils Relative %: 67 %
Platelets: 243 10*3/uL (ref 150–400)
RBC: 4.76 MIL/uL (ref 3.87–5.11)
RDW: 14.5 % (ref 11.5–15.5)
WBC: 10.2 10*3/uL (ref 4.0–10.5)
nRBC: 0 % (ref 0.0–0.2)

## 2020-08-27 LAB — BASIC METABOLIC PANEL
Anion gap: 11 (ref 5–15)
BUN: 29 mg/dL — ABNORMAL HIGH (ref 8–23)
CO2: 32 mmol/L (ref 22–32)
Calcium: 9.1 mg/dL (ref 8.9–10.3)
Chloride: 95 mmol/L — ABNORMAL LOW (ref 98–111)
Creatinine, Ser: 1.59 mg/dL — ABNORMAL HIGH (ref 0.44–1.00)
GFR, Estimated: 32 mL/min — ABNORMAL LOW (ref >60)
Glucose, Bld: 331 mg/dL — ABNORMAL HIGH (ref 70–99)
Potassium: 3.6 mmol/L (ref 3.5–5.1)
Sodium: 138 mmol/L (ref 135–145)

## 2020-08-27 LAB — LACTIC ACID, PLASMA: Lactic Acid, Venous: 1.3 mmol/L (ref 0.5–1.9)

## 2020-08-27 MED ORDER — ACETAMINOPHEN 325 MG PO TABS
650.0000 mg | ORAL_TABLET | Freq: Once | ORAL | Status: AC
  Administered 2020-08-27: 650 mg via ORAL
  Filled 2020-08-27: qty 2

## 2020-08-27 MED ORDER — CEPHALEXIN 500 MG PO CAPS: 500.0000 mg | ORAL_CAPSULE | Freq: Three times a day (TID) | ORAL | 0 refills | Status: AC

## 2020-08-27 MED ORDER — HYDRALAZINE HCL 25 MG PO TABS
25.0000 mg | ORAL_TABLET | Freq: Once | ORAL | Status: AC
  Administered 2020-08-27: 25 mg via ORAL
  Filled 2020-08-27: qty 1

## 2020-08-27 NOTE — ED Triage Notes (Signed)
Pt is from [facility] and has been experiencing edema, redness and pain in her right lower extremity for approx a one week.

## 2020-08-27 NOTE — ED Provider Notes (Signed)
[facility] EMERGENCY DEPT Provider Note   CSN: [id number] Arrival date & time: 08/27/20  1648     History Chief Complaint  Patient presents with   Leg Pain    [name] [name] is a 87 y.o. female.  Patient presents with discoloration and swelling to the right calf.  She is noticed it about a week.  They are concerned that she may have an infection.  Patient otherwise denies any fevers or cough or vomiting or diarrhea.  Denies any pain.      Past Medical History:  Diagnosis Date   Colon polyp    Diabetes mellitus 1992   Hypertension 1996   Pancreatitis     Patient Active Problem List   Diagnosis Date Noted   Benign essential HTN    Primary osteoarthritis of right knee    Slow transit constipation    Labile blood pressure    Controlled type 2 diabetes mellitus with hyperglycemia (HCC)    Labile blood glucose    Acute renal failure superimposed on chronic kidney disease (HCC)    Anxiety state    Nontraumatic acute hemorrhage of basal ganglia (HCC) 01/16/2020   Lethargy    Dysphagia, post-stroke    Leukocytosis    CKD (chronic kidney disease), stage III (HCC) 01/10/2020   Morbid obesity (HCC)    Chronic diastolic congestive heart failure (HCC)    Uncontrolled type 2 diabetes mellitus with hyperglycemia (HCC)    Essential hypertension    FUO (fever of unknown origin)    ICH (intracerebral hemorrhage) (HCC) 01/04/2020   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     No family history on file.  Social History   Tobacco Use   Smoking status: Never   Smokeless tobacco: Never  Vaping Use   Vaping Use: Never used  Substance Use Topics   Alcohol use: Never   Drug use: Never    Home Medications Prior to Admission medications   Medication Sig Start Date End Date Taking? Authorizing Provider  acetaminophen (TYLENOL) 500 MG tablet Take 1 tablet (500 mg total) by mouth every 6 (six) hours as needed for mild pain. 02/12/20  Yes Love, Evlyn Kanner, PA-C  albuterol (VENTOLIN HFA) 108 (90 Base) MCG/ACT inhaler Inhale 2 puffs into the lungs every 6 (six) hours as needed for wheezing or shortness of breath. 02/12/20  Yes Love, Evlyn Kanner, PA-C  aspirin EC 81 MG tablet Take 1 tablet (81 mg total) by mouth daily. Swallow whole. 04/12/20  Yes McCue, Shanda Bumps, NP  atorvastatin (LIPITOR) 40 MG tablet Take 1 tablet (40 mg total) by mouth daily. 04/13/20  Yes McCue, Shanda Bumps, NP  diclofenac sodium (VOLTAREN) 1 % GEL Apply 4 g topically 4 (four) times daily. 02/12/20  Yes Love, Evlyn Kanner, PA-C  furosemide (LASIX) 20 MG tablet Take 0.5 tablets (10 mg total) by mouth  See admin instructions. 02/12/20  Yes Love, Evlyn Kanner, PA-C  insulin glargine (LANTUS) 100 UNIT/ML injection Inject 0.4 mLs (40 Units total) into the skin 2 (two) times daily. 02/12/20  Yes Love, Evlyn Kanner, PA-C  insulin lispro (HUMALOG) 100 UNIT/ML injection Inject into the skin 3 (three) times daily before meals.   Yes [provider]  loratadine (CLARITIN) 10 MG tablet Take by mouth. 06/22/20  Yes [provider]  magnesium oxide (MAG-OX) 400 MG tablet Take 400 mg by mouth daily.   Yes [provider]  pantoprazole (PROTONIX) 40 MG tablet Take 1 tablet (40 mg total) by mouth daily. 02/12/20 02/11/21 Yes Love, Evlyn Kanner, PA-C  potassium chloride (KLOR-CON) 10 MEQ tablet Take 10 mEq by mouth daily.  07/10/20  Yes [provider]  QUEtiapine (SEROQUEL) 25 MG tablet Take 0.5 tablets (12.5 mg total) by mouth at bedtime as needed (anxiety). 03/23/20  Yes Jones Bales, NP  amantadine (SYMMETREL) 100 MG capsule Take 1 capsule (100 mg total) by mouth daily. 02/12/20   Love, Evlyn Kanner, PA-C  azelastine (ASTELIN) 0.1 % nasal spray two sprays by Both Nostrils route 2 (two) times daily. 06/22/20   [provider]  chlorthalidone (HYGROTON) 25 MG tablet Take 1.5 tablets (37.5 mg total) by mouth daily. 02/12/20   Love, Evlyn Kanner, PA-C  hydrALAZINE (APRESOLINE) 100 MG tablet Take 1 tablet (100 mg total) by mouth 3 (three) times daily. 02/12/20   Love, Evlyn Kanner, PA-C  Insulin Syringe-Needle U-100 31G X 5/16" 1 ML MISC 1 application by Does not apply route 2 (two) times daily. Patient not taking: Reported on 07/29/2020 02/12/20   Jacquelynn Cree, PA-C  metoprolol tartrate (LOPRESSOR) 100 MG tablet Take 1 tablet (100 mg total) by mouth 2 (two) times daily. 02/12/20   Love, Evlyn Kanner, PA-C  polyethylene glycol (MIRALAX / GLYCOLAX) 17 g packet Take 17 g by mouth 2 (two) times daily. 02/12/20   Love, Evlyn Kanner, PA-C  senna-docusate (SENOKOT-S) 8.6-50 MG tablet Take 1 tablet by mouth 2 (two) times daily. 02/12/20   Love, Evlyn Kanner, PA-C    Allergies    Amlodipine, Ace inhibitors, Atorvastatin, Canagliflozin, Clonidine hcl, Amlodipine besylate, Morphine sulfate, and Morphine and related  Review of Systems   Review of Systems  Constitutional:  Negative for fever.  HENT:  Negative for ear pain.   Eyes:  Negative for pain.  Respiratory:  Negative for cough.   Cardiovascular:  Negative for chest pain.  Gastrointestinal:  Negative for abdominal pain.  Genitourinary:  Negative for flank pain.  Musculoskeletal:  Negative for back pain.  Skin:  Negative for rash.  Neurological:  Negative for headaches.   Physical Exam Updated Vital Signs BP (!) 193/93   Pulse 72   Temp 99.8 F (37.7 C)   Resp 16    Ht 5\' 5"  (1.651 m)   Wt 95.3 kg   SpO2 97%   BMI 34.95 kg/m   Physical Exam Constitutional:      General: She is not in acute distress.    Appearance: Normal appearance.  HENT:     Head: Normocephalic.     Nose: Nose normal.  Eyes:     Extraocular Movements: Extraocular movements intact.  Cardiovascular:     Rate and Rhythm: Normal rate.  Pulmonary:     Effort: Pulmonary effort is normal.  Musculoskeletal:        General: Normal range of motion.     Cervical back: Normal range of  motion.     Comments: Right calf 2+ pitting edema.  Some induration seen on the lateral aspect of the calf.  No abnormal warmth or purulent discharge noted.  Neurological:     General: No focal deficit present.     Mental Status: She is alert. Mental status is at baseline.    ED Results / Procedures / Treatments   Labs (all labs ordered are listed, but only abnormal results are displayed) Labs Reviewed  BASIC METABOLIC PANEL - Abnormal; Notable for the following components:      Result Value   Chloride 95 (*)    Glucose, Bld 331 (*)    BUN 29 (*)    Creatinine, Ser 1.59 (*)    GFR, Estimated 32 (*)    All other components within normal limits  CULTURE, BLOOD (ROUTINE X 2)  CULTURE, BLOOD (ROUTINE X 2)  CBC WITH DIFFERENTIAL/PLATELET  LACTIC ACID, PLASMA    EKG None  Radiology US Venous Img Lower Bilateral (DVT)  Result Date: 08/27/2020 CLINICAL DATA:  Bilateral leg swelling. EXAM: BILATERAL LOWER EXTREMITY VENOUS DOPPLER ULTRASOUND TECHNIQUE: Gray-scale sonography with compression, as well as color and duplex ultrasound, were performed to evaluate the deep venous system(s) from the level of the common femoral vein through the popliteal and proximal calf veins. COMPARISON:  None. FINDINGS: VENOUS Normal compressibility of the BILATERAL common femoral, superficial femoral, and popliteal veins. Visualized portions of the BILATERAL profunda femoral veins and BILATERAL great saphenous veins  are unremarkable. The BILATERAL calf veins are poorly visualized. No filling defects to suggest DVT on grayscale or color Doppler imaging. Doppler waveforms show normal direction of venous flow, normal respiratory plasticity and response to augmentation. Limited views of the contralateral common femoral vein are unremarkable. OTHER None. Limitations: none IMPRESSION: No evidence of DVT within the BILATERAL lower extremities. Electronically Signed   By: Aram Candela M.D.   On: 08/27/2020 22:17    Procedures Procedures   Medications Ordered in ED Medications  hydrALAZINE (APRESOLINE) tablet 25 mg (has no administration in time range)  acetaminophen (TYLENOL) tablet 650 mg (650 mg Oral Given 08/27/20 2028)    ED Course  I have reviewed the triage vital signs and the nursing notes.  Pertinent labs & imaging results that were available during my care of the patient were reviewed by me and considered in my medical decision making (see chart for details).    MDM Rules/Calculators/A&P                          Lab work is unremarkable white count normal chemistry normal.  Patient is hypertensive and advised to follow-up with her doctor regarding this finding again.  Given she is a history of diabetes and 87 years old opted to treat empirically.  We will give a course of Keflex to take at home.  Ultrasound shows no evidence of DVT.   Final Clinical Impression(s) / ED Diagnoses Final diagnoses:  Leg swelling    Rx / DC Orders ED Discharge Orders     None        Cheryll Cockayne, MD 08/27/20 2247

## 2020-08-27 NOTE — ED Notes (Signed)
Ultrasound at bedside

## 2020-08-27 NOTE — Discharge Instructions (Signed)
Your blood test and ultrasound were normal.  No evidence of clot or abnormal blood test.  Your blood pressure was high however.  Please follow-up with your primary care doctor regarding your high blood pressure.  Call your primary care doctor or specialist as discussed in the next 2-3 days.   Return immediately back to the ER if:  Your symptoms worsen within the next 12-24 hours. You develop new symptoms such as new fevers, persistent vomiting, new pain, shortness of breath, or new weakness or numbness, or if you have any other concerns.

## 2020-09-02 LAB — CULTURE, BLOOD (ROUTINE X 2)
Culture: NO GROWTH
Culture: NO GROWTH
Special Requests: ADEQUATE
Special Requests: ADEQUATE

## 2020-09-16 ENCOUNTER — Ambulatory Visit (INDEPENDENT_AMBULATORY_CARE_PROVIDER_SITE_OTHER): Payer: Medicare Other | Admitting: Ophthalmology

## 2020-09-16 ENCOUNTER — Encounter (INDEPENDENT_AMBULATORY_CARE_PROVIDER_SITE_OTHER): Payer: Medicare Other | Admitting: Ophthalmology

## 2020-09-16 ENCOUNTER — Other Ambulatory Visit: Payer: Self-pay

## 2020-09-16 ENCOUNTER — Encounter (INDEPENDENT_AMBULATORY_CARE_PROVIDER_SITE_OTHER): Payer: Self-pay | Admitting: Ophthalmology

## 2020-09-16 DIAGNOSIS — E113293 Type 2 diabetes mellitus with mild nonproliferative diabetic retinopathy without macular edema, bilateral: Secondary | ICD-10-CM | POA: Diagnosis not present

## 2020-09-16 DIAGNOSIS — H35372 Puckering of macula, left eye: Secondary | ICD-10-CM | POA: Diagnosis not present

## 2020-09-16 NOTE — Assessment & Plan Note (Signed)
Minor clinically, will observe

## 2020-09-16 NOTE — Assessment & Plan Note (Signed)
The nature of mild nonproliferative diabetic retinopathy was discussed with the patient. Emphasis was placed on tight glucose, blood pressure, and serum lipid control. Avoidance of smoking was emphasized. Maintenance of normal body weight was emphasized. Appropriate follow up dilated exam is 1 year. 

## 2020-09-16 NOTE — Progress Notes (Signed)
09/16/2020     CHIEF COMPLAINT Patient presents for Retina Evaluation and Retina Follow Up   HISTORY OF PRESENT ILLNESS: [name] [name] is a 87 y.o. female who presents to the clinic today for:   HPI     Retina Follow Up           Diagnosis: Diabetic Retinopathy   Laterality: both eyes   Onset: 9 months ago   Severity: mild   Duration: 9 months   Course: stable       Last edited by Edmon Crape, MD on 09/16/2020  2:55 PM.      Referring physician: [name], MD [address] [place],  [place] [address]  HISTORICAL INFORMATION:   Selected notes from the MEDICAL RECORD NUMBER    Lab Results  Component Value Date   HGBA1C 8.6 (H) 01/04/2020     CURRENT MEDICATIONS: No current outpatient medications on file. (Ophthalmic Drugs)   No current facility-administered medications for this visit. (Ophthalmic Drugs)   Current Outpatient Medications (Other)  Medication Sig   acetaminophen (TYLENOL) 500 MG tablet Take 1 tablet (500 mg total) by mouth every 6 (six) hours as needed for mild pain.   albuterol (VENTOLIN HFA) 108 (90 Base) MCG/ACT inhaler Inhale 2 puffs into the lungs every 6 (six) hours as needed for wheezing or shortness of breath.   amantadine (SYMMETREL) 100 MG capsule Take 1 capsule (100 mg total) by mouth daily.   aspirin EC 81 MG tablet Take 1 tablet (81 mg total) by mouth daily. Swallow whole.   atorvastatin (LIPITOR) 40 MG tablet Take 1 tablet (40 mg total) by mouth daily.   azelastine (ASTELIN) 0.1 % nasal spray two sprays by Both Nostrils route 2 (two) times daily.   chlorthalidone (HYGROTON) 25 MG tablet Take 1.5 tablets (37.5 mg total) by mouth daily.   diclofenac sodium (VOLTAREN) 1 % GEL Apply 4 g topically 4 (four) times daily.   furosemide (LASIX) 20 MG tablet Take 0.5 tablets (10 mg total) by mouth See admin instructions.   hydrALAZINE (APRESOLINE) 100 MG tablet Take 1 tablet (100 mg total) by mouth 3 (three) times  daily.   insulin glargine (LANTUS) 100 UNIT/ML injection Inject 0.4 mLs (40 Units total) into the skin 2 (two) times daily.   insulin lispro (HUMALOG) 100 UNIT/ML injection Inject into the skin 3 (three) times daily before meals.   Insulin Syringe-Needle U-100 31G X 5/16" 1 ML MISC 1 application by Does not apply route 2 (two) times daily. (Patient not taking: Reported on 07/29/2020)   loratadine (CLARITIN) 10 MG tablet Take by mouth.   magnesium oxide (MAG-OX) 400 MG tablet Take 400 mg by mouth daily.   metoprolol tartrate (LOPRESSOR) 100 MG tablet Take 1 tablet (100 mg total) by mouth 2 (two) times daily.   pantoprazole (PROTONIX) 40 MG tablet Take 1 tablet (40 mg total) by mouth daily.   polyethylene glycol (MIRALAX / GLYCOLAX) 17 g packet Take 17 g by mouth 2 (two) times daily.   potassium chloride (KLOR-CON) 10 MEQ tablet Take 10 mEq by mouth daily.   QUEtiapine (SEROQUEL) 25 MG tablet Take 0.5 tablets (12.5 mg total) by mouth at bedtime as needed (anxiety).   senna-docusate (SENOKOT-S) 8.6-50 MG tablet Take 1 tablet by mouth 2 (two) times daily.   No current facility-administered medications for this visit. (Other)      REVIEW OF SYSTEMS:    ALLERGIES Allergies  Allergen Reactions   Amlodipine  Other (See Comments)    EDEMA   Ace Inhibitors Cough    Other reaction(s): cough   Atorvastatin     Other reaction(s): muscle & joint aches, Other   Canagliflozin     INVOKANA Other reaction(s): urinary tract infections   Clonidine Hcl     Other reaction(s): Other, Unknown   Amlodipine Besylate     Other reaction(s): Unknown   Morphine Sulfate     Other reaction(s): rash   Morphine And Related Rash and Other (See Comments)    For long periods of time (??)    PAST MEDICAL HISTORY Past Medical History:  Diagnosis Date   Colon polyp    Diabetes mellitus 1992   Hypertension 1996   Pancreatitis    Past Surgical History:  Procedure Laterality Date   CHOLECYSTECTOMY      COLONOSCOPY     ERCP     PARTIAL HYSTERECTOMY  87years old   POLYPECTOMY     UPPER GASTROINTESTINAL ENDOSCOPY     WRIST FRACTURE SURGERY  left arm,87years old    FAMILY HISTORY No family history on file.  SOCIAL HISTORY Social History   Tobacco Use   Smoking status: Never   Smokeless tobacco: Never  Vaping Use   Vaping Use: Never used  Substance Use Topics   Alcohol use: Never   Drug use: Never         OPHTHALMIC EXAM:  Base Eye Exam     Visual Acuity (ETDRS)       Right Left   Dist [place] 20/30 20/40         Pupils       Pupils APD   Right PERRL None   Left PERRL None         Visual Fields       Left Right    Full Full         Neuro/Psych     Oriented x3: Yes   Mood/Affect: Normal         Dilation     Both eyes: 1.0% Mydriacyl, 2.5% Phenylephrine @ 2:57 PM           Slit Lamp and Fundus Exam     External Exam       Right Left   External Normal Normal         Slit Lamp Exam       Right Left   Lids/Lashes Normal Ptosis   Conjunctiva/Sclera White and quiet White and quiet   Cornea Clear Clear   Anterior Chamber Deep and quiet Deep and quiet   Iris Round and reactive Round and reactive   Lens Posterior chamber intraocular lens Posterior chamber intraocular lens   Anterior Vitreous Normal Normal         Fundus Exam       Right Left   Posterior Vitreous Normal Normal   Disc Normal Normal   C/D Ratio 0.7 0.6   Macula Microaneurysms Epiretinal membrane mild, Microaneurysms   Vessels NPDR- Mild NPDR- Mild   Periphery Normal Normal            IMAGING AND PROCEDURES  Imaging and Procedures for 09/16/20           ASSESSMENT/PLAN:  Mild nonproliferative diabetic retinopathy of both eyes (HCC) The nature of mild nonproliferative diabetic retinopathy was discussed with the patient. Emphasis was placed on tight glucose, blood pressure, and serum lipid control. Avoidance of smoking was emphasized. Maintenance of  normal body weight was emphasized.  Appropriate follow up dilated exam is 1 year.  Left epiretinal membrane Minor clinically, will observe     ICD-10-CM   1. Mild nonproliferative diabetic retinopathy of both eyes without macular edema associated with type 2 diabetes mellitus (HCC)  C78.9381     2. Left epiretinal membrane  H35.372       1.  OU with mild nonproliferative retinopathy, no progression  2.  No visual field nor acuity nor visual impact nor ocular motility impact from recent basal ganglia vascular event causing strokelike symptoms  3.  Ophthalmic Meds Ordered this visit:  No orders of the defined types were placed in this encounter.      Return in about 9 months (around 06/17/2021) for DILATE OU, COLOR FP, OCT.  There are no Patient Instructions on file for this visit.   Explained the diagnoses, plan, and follow up with the patient and they expressed understanding.  Patient expressed understanding of the importance of proper follow up care.   Alford Highland Bueford Arp M.D. Diseases & Surgery of the Retina and Vitreous [facility] 09/16/20     Abbreviations: M myopia (nearsighted); A astigmatism; H hyperopia (farsighted); P presbyopia; Mrx spectacle prescription;  CTL contact lenses; OD right eye; OS left eye; OU both eyes  XT exotropia; ET esotropia; PEK punctate epithelial keratitis; PEE punctate epithelial erosions; DES dry eye syndrome; MGD meibomian gland dysfunction; ATs artificial tears; PFAT's preservative free artificial tears; NSC nuclear sclerotic cataract; PSC posterior subcapsular cataract; ERM epi-retinal membrane; PVD posterior vitreous detachment; RD retinal detachment; DM diabetes mellitus; DR diabetic retinopathy; NPDR non-proliferative diabetic retinopathy; PDR proliferative diabetic retinopathy; CSME clinically significant macular edema; DME diabetic macular edema; dbh dot blot hemorrhages; CWS cotton wool spot; POAG primary open angle  glaucoma; C/D cup-to-disc ratio; HVF humphrey visual field; GVF goldmann visual field; OCT optical coherence tomography; IOP intraocular pressure; BRVO Branch retinal vein occlusion; CRVO central retinal vein occlusion; CRAO central retinal artery occlusion; BRAO branch retinal artery occlusion; RT retinal tear; SB scleral buckle; PPV pars plana vitrectomy; VH Vitreous hemorrhage; PRP panretinal laser photocoagulation; IVK intravitreal kenalog; VMT vitreomacular traction; MH Macular hole;  NVD neovascularization of the disc; NVE neovascularization elsewhere; AREDS age related eye disease study; ARMD age related macular degeneration; POAG primary open angle glaucoma; EBMD epithelial/anterior basement membrane dystrophy; ACIOL anterior chamber intraocular lens; IOL intraocular lens; PCIOL posterior chamber intraocular lens; Phaco/IOL phacoemulsification with intraocular lens placement; PRK photorefractive keratectomy; LASIK laser assisted in situ keratomileusis; HTN hypertension; DM diabetes mellitus; COPD chronic obstructive pulmonary disease

## 2020-10-18 ENCOUNTER — Ambulatory Visit: Payer: Medicare Other | Admitting: Physical Medicine and Rehabilitation

## 2020-10-28 ENCOUNTER — Other Ambulatory Visit: Payer: Self-pay | Admitting: Physical Medicine and Rehabilitation

## 2020-10-28 ENCOUNTER — Encounter
Payer: Medicare Other | Attending: Physical Medicine and Rehabilitation | Admitting: Physical Medicine and Rehabilitation

## 2020-10-28 ENCOUNTER — Other Ambulatory Visit: Payer: Self-pay

## 2020-10-28 VITALS — BP 161/70 | HR 55 | Temp 97.8°F | Ht 65.0 in | Wt 210.0 lb

## 2020-10-28 DIAGNOSIS — Z7409 Other reduced mobility: Secondary | ICD-10-CM | POA: Diagnosis present

## 2020-10-28 DIAGNOSIS — I10 Essential (primary) hypertension: Secondary | ICD-10-CM | POA: Insufficient documentation

## 2020-10-28 DIAGNOSIS — R4701 Aphasia: Secondary | ICD-10-CM | POA: Insufficient documentation

## 2020-10-28 MED ORDER — DEXCOM G6 RECEIVER DEVI: 1.0000 | Freq: Every day | 3 refills | Status: DC

## 2020-10-28 MED ORDER — DEXCOM G6 TRANSMITTER MISC: 1.0000 | Freq: Every day | 0 refills | Status: DC

## 2020-10-28 MED ORDER — DEXCOM G6 SENSOR MISC: 1.0000 | Freq: Every day | 3 refills | Status: DC

## 2020-10-28 NOTE — Progress Notes (Signed)
Subjective:    Patient ID: [name] [name], female    DOB: 06-19-1932, 87 y.o.   MRN: [id number]  HPI: [name] [name] is a 87 y.o. female who is here for HFU appointment regarding her Nontraumatic acute hemorrhage of basal ganglia, Essential Hypertension, Primary Osteoarthritis of Right Knee and  Controlled Type 2 Diabetes Mellitus with hyperglycemia with long-term current insulin use. [name] [name] was brought to [facility] via EMS on 01/04/2020 after she collapsed in church during service, while standing. Neurology consulted.  CT Head WO Contrast:  IMPRESSION: 1.3 x 2.7 x 1.5 cm acute parenchymal hemorrhage centered within the left thalamus and basal ganglia. Local mass effect with partial effacement of the third ventricle and 2 mm rightward midline shift at the level of the septum pellucidum.   Mild cerebral atrophy with moderate chronic small vessel ischemic disease.   Known chronic pontine lacunar infarct.   7 mm focus of lobular dural-based calcification versus incidental small calcified meningioma overlying the anterior right frontal lobe.   IMPRESSION: Left thalamic hemorrhage unchanged from earlier today. No new hemorrhage or hydrocephalus.   Moderate chronic microvascular ischemic change in the white matter.  MR Head WO Contrast:  IMPRESSION: 1. No visible mass or vascular lesion underlying the left thalamic hematoma. 2. Extensive chronic small vessel ischemia. 3. Intracranial atherosclerosis.  [name] [name] was admitted to inpatient Rehabilitation on 01/16/2020 and discharged to [facility] on 02/12/2020. She is receiving outpatient therapy with a contracted agency at [facility] per social work discharge summary [name] [name], Dupree [name].  She states she has pain in her right eye, no drainage noted. She will F/U with her PCP.  Also reports right knee pain requesting cortisone injection, she will be scheduled with Dr Allena Katz, she verbalizes understanding.  She rated her pain on The Health and History 0. Also reports she has a good appetite.    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  She was in sales  The Arlet[employer]ale has benefitted her greatly  Voltaren gel has already been helping.  She has a BM one every other day.   Pain Inventory Average Pain 0 Pain Right Now 0 My pain is aching   LOCATION OF PAIN  lower back pain  BOWEL Number of stools per week: 5 Oral laxative use Yes  Type of laxative sennokot  Enema or suppository use No  History of colostomy No  Incontinent No   BLADDER Normal In and out cath, frequency n/a  Able to self cath  n/a Bladder incontinence No  Frequent urination No  Leakage with coughing Yes  Difficulty starting stream No  Incomplete bladder emptying No    Mobility walk with assistance use a walker ability to climb steps?  no do you drive?  no use a wheelchair needs help with transfers Do you have any goals in this area?  yes  Function retired I need assistance with the following:  dressing, bathing, meal prep, and household duties  Neuro/Psych weakness numbness trouble walking  Prior Studies N/a  Physicians involved in your care N/a   No family history on file. Social History   Socioeconomic History   Marital status: Widowed    Spouse name: Not on file   Number of children: Not on file   Years of education: Not on file   Highest education level: Not on file  Occupational History   Not on  file  Tobacco Use   Smoking status: Never   Smokeless tobacco: Never  Vaping Use   Vaping Use: Never used  Substance and Sexual Activity   Alcohol use: Never   Drug use: Never   Sexual activity: Not on file  Other Topics Concern   Not on file  Social History Narrative   Not on file   Social Determinants of Health   Financial Resource Strain: Not on file  Food Insecurity: Not on file  Transportation  Needs: Not on file  Physical Activity: Not on file  Stress: Not on file  Social Connections: Not on file   Past Surgical History:  Procedure Laterality Date   CHOLECYSTECTOMY     COLONOSCOPY     ERCP     PARTIAL HYSTERECTOMY  87years old   POLYPECTOMY     UPPER GASTROINTESTINAL ENDOSCOPY     WRIST FRACTURE SURGERY  left arm,87years old   Past Medical History:  Diagnosis Date   Colon polyp    Diabetes mellitus 1992   Hypertension 1996   Pancreatitis    BP (!) 161/70 (BP Location: Right Arm)   Pulse (!) 55   Temp 97.8 F (36.6 C) (Oral)   Ht 5\' 5"  (1.651 m)   Wt 210 lb (95.3 kg)   SpO2 95%   BMI 34.95 kg/m   Opioid Risk Score:   Fall Risk Score:  `1  Depression screen PHQ 2/9  Depression screen [facility] 2/9 05/06/2020 03/23/2020  Decreased Interest 0 0  Down, Depressed, Hopeless 0 0  PHQ - 2 Score 0 0  Altered sleeping - 0  Tired, decreased energy - 0  Change in appetite - 0  Feeling bad or failure about yourself  - 0  Trouble concentrating - 0  Moving slowly or fidgety/restless - 0  Suicidal thoughts - 0  PHQ-9 Score - 0    Review of Systems  Constitutional: Negative.   HENT: Negative.    Eyes: Negative.   Respiratory: Negative.    Cardiovascular: Negative.        Limb swelling , right arm  Gastrointestinal: Negative.   Endocrine: Negative.        High blood sugar  Genitourinary: Negative.   Musculoskeletal:  Positive for back pain and gait problem.  Allergic/Immunologic: Negative.   Neurological:  Positive for weakness and numbness.  Hematological: Negative.   Psychiatric/Behavioral: Negative.    All other systems reviewed and are negative.     Objective:   Physical Exam Vitals and nursing note reviewed. BP 161/70 Constitutional:      Appearance: Normal appearance.  Cardiovascular:     Rate and Rhythm: Normal rate and regular rhythm.     Pulses: Normal pulses.     Heart sounds: Normal heart sounds.  Pulmonary:     Effort: Pulmonary effort is  normal.     Breath sounds: Normal breath sounds.  Musculoskeletal:     Cervical back: Normal range of motion and neck supple.     Comments: Normal Muscle Bulk and Muscle Testing Reveals:  Upper Extremities: Full ROM and Muscle Strength 5/5 Lower Extremities: Full ROM and Muscle Strength 5/5 Arrived in wheelchair  Skin:    General: Skin is warm and dry.  Neurological:     Mental Status: She is alert and oriented to person, place, and time.  Psychiatric:        Mood and Affect: Mood normal.        Behavior: Behavior normal.  Assessment & Plan:  1.Nontraumatic acute hemorrhage of basal ganglia: Continue Outpatient Therapy.  She has a scheduled appointment with Neurology.  2.Essential Hypertension: Continue current medication regimen. PCP following. Continue to monitor.  HTN: -BP is 161/70 today.  -Advised checking BP daily at home and logging results to bring into follow-up appointment with her PCP and myself. -Reviewed BP meds today.  -Advised regarding healthy foods that can help lower blood pressure and provided with a list: 1) citrus foods- high in vitamins and minerals 2) salmon and other fatty fish - reduces inflammation and oxylipins 3) swiss chard (leafy green)- high level of nitrates 4) pumpkin seeds- one of the best natural sources of magnesium 5) Beans and lentils- high in fiber, magnesium, and potassium 6) Berries- high in flavonoids 7) Amaranth (whole grain, can be cooked similarly to rice and oats)- high in magnesium and fiber 8) Pistachios- even more effective at reducing BP than other nuts 9) Carrots- high in phenolic compounds that relax blood vessels and reduce inflammation 10) Celery- contain phthalides that relax tissues of arterial walls 11) Tomatoes- can also improve cholesterol and reduce risk of heart disease 12) Broccoli- good source of magnesium, calcium, and potassium 13) Greek yogurt: high in potassium and calcium 14) Herbs and spices: Celery  seed, cilantro, saffron, lemongrass, black cumin, ginseng, cinnamon, cardamom, sweet basil, and ginger 15) Chia and flax seeds- also help to lower cholesterol and blood sugar 16) Beets- high levels of nitrates that relax blood vessels  17) spinach and bananas- high in potassium   3.Controlled Type 2 Diabetes Mellitus with hyperglycemia with long-term current insulin use. PCP following.  4. Primary Osteoarthritis of Right Knee: will schedule for Zilretta injection in 3 months 5. Aphasia: recommended continuing speech, educated that progress can continue in the first 2 years 6. Impaired mobility: start taking daily walks outside.  7. Constipation:  -Provided list of following foods that help with constipation and highlighted a few: 1) prunes- contain high amounts of fiber.  2) apples- has a form of dietary fiber called pectin that accelerates stool movement and increases beneficial gut bacteria 3) pears- in addition to fiber, also high in fructose and sorbitol which have laxative effect 4) figs- contain an enzyme ficin which helps to speed colonic transit 5) kiwis- contain an enzyme actinidin that improves gut motility and reduces constipation 6) oranges- rich in pectin (like apples) 7) grapefruits- contain a flavanol naringenin which has a laxative effect 8) vegetables- rich in fiber and also great sources of folate, vitamin C, and K 9) artichoke- high in inulin, prebiotic great for the microbiome 10) chicory- increases stool frequency and softness (can be added to coffee) 11) rhubarb- laxative effect 12) sweet potato- high fiber 13) beans, peas, and lentils- contain both soluble and insoluble fiber 14) chia seeds- improves intestinal health and gut flora 15) flaxseeds- laxative effect 16) whole grain rye bread- high in fiber 17) oat bran- high in soluble and insoluble fiber 18) kefir- softens stools -recommended to try at least one of these foods every day.  -drink 6-8 glasses of water  per day -walk regularly, especially after meals.

## 2020-10-28 NOTE — Patient Instructions (Addendum)
-  BP is 161/70 today.  -Advised regarding healthy foods that can help lower blood pressure and provided with a list: 1) citrus foods- high in vitamins and minerals 2) salmon and other fatty fish - reduces inflammation and oxylipins 3) swiss chard (leafy green)- high level of nitrates 4) pumpkin seeds- one of the best natural sources of magnesium 5) Beans and lentils- high in fiber, magnesium, and potassium 6) Berries- high in flavonoids 7) Amaranth (whole grain, can be cooked similarly to rice and oats)- high in magnesium and fiber 8) Pistachios- even more effective at reducing BP than other nuts 9) Carrots- high in phenolic compounds that relax blood vessels and reduce inflammation 10) Celery- contain phthalides that relax tissues of arterial walls 11) Tomatoes- can also improve cholesterol and reduce risk of heart disease 12) Broccoli- good source of magnesium, calcium, and potassium 13) Greek yogurt: high in potassium and calcium 14) Herbs and spices: Celery seed, cilantro, saffron, lemongrass, black cumin, ginseng, cinnamon, cardamom, sweet basil, and ginger 15) Chia and flax seeds- also help to lower cholesterol and blood sugar 16) Beets- high levels of nitrates that relax blood vessels  17) spinach and bananas- high in potassium   Constipation:  -Provided list of following foods that help with constipation and highlighted a few: 1) prunes- contain high amounts of fiber.  2) apples- has a form of dietary fiber called pectin that accelerates stool movement and increases beneficial gut bacteria 3) pears- in addition to fiber, also high in fructose and sorbitol which have laxative effect 4) figs- contain an enzyme ficin which helps to speed colonic transit 5) kiwis- contain an enzyme actinidin that improves gut motility and reduces constipation 6) oranges- rich in pectin (like apples) 7) grapefruits- contain a flavanol naringenin which has a laxative effect 8) vegetables- rich in fiber  and also great sources of folate, vitamin C, and K 9) artichoke- high in inulin, prebiotic great for the microbiome 10) chicory- increases stool frequency and softness (can be added to coffee) 11) rhubarb- laxative effect 12) sweet potato- high fiber 13) beans, peas, and lentils- contain both soluble and insoluble fiber 14) chia seeds- improves intestinal health and gut flora 15) flaxseeds- laxative effect 16) whole grain rye bread- high in fiber 17) oat bran- high in soluble and insoluble fiber 18) kefir- softens stools -recommended to try at least one of these foods every day.  -drink 6-8 glasses of water per day -walk regularly, especially after meals.

## 2020-10-28 NOTE — Addendum Note (Signed)
Addended by: Horton Chin on: 10/28/2020 12:02 PM   Modules accepted: Orders

## 2020-11-09 ENCOUNTER — Telehealth: Payer: Self-pay

## 2020-11-09 NOTE — Telephone Encounter (Signed)
Dexcom G6  is not covered under patient care plan. Copy of fax to be scanned.   If there are any questions have patient contact the customer service number on the back of the ID card. I have tried to reach patient but not successful.

## 2020-11-09 NOTE — Telephone Encounter (Signed)
PA started over the phone [phone], for Dexcom G6 Sales promotion account executive, [employer]).  Ref #  [id number]. Information to be faxed to Dr. Carlis Abbott.

## 2020-11-10 NOTE — Telephone Encounter (Signed)
I tried reaching out to Mirage C. Hodzic again today. Call not successful.

## 2021-01-13 ENCOUNTER — Encounter: Payer: Self-pay | Admitting: Physical Medicine and Rehabilitation

## 2021-01-13 ENCOUNTER — Encounter
Payer: Medicare Other | Attending: Physical Medicine and Rehabilitation | Admitting: Physical Medicine and Rehabilitation

## 2021-01-13 ENCOUNTER — Other Ambulatory Visit: Payer: Self-pay

## 2021-01-13 VITALS — BP 161/74 | HR 57 | Temp 97.8°F | Ht 65.0 in | Wt 210.0 lb

## 2021-01-13 DIAGNOSIS — M1711 Unilateral primary osteoarthritis, right knee: Secondary | ICD-10-CM | POA: Insufficient documentation

## 2021-01-13 NOTE — Progress Notes (Signed)
Knee injection, right  Indication: Right Knee pain not relieved by medication management and other conservative care.  Informed consent was obtained after describing risks and benefits of the procedure with the patient, this includes bleeding, bruising, infection and medication side effects. The patient wishes to proceed and has given written consent. The patient was placed in a recumbent position. The medial aspect of the knee was marked and prepped with Betadine and alcohol. It was then entered with a 25-gauge 1-1/2 inch needle and 1 mL of 1% lidocaine was injected into the skin and subcutaneous tissue.  After negative draw back for blood, a solution containing Zilretta was injected. The patient tolerated the procedure well. Post procedure instructions were given.

## 2021-01-24 ENCOUNTER — Other Ambulatory Visit: Payer: Self-pay | Admitting: Internal Medicine

## 2021-01-24 ENCOUNTER — Ambulatory Visit
Admission: RE | Admit: 2021-01-24 | Discharge: 2021-01-24 | Disposition: A | Discharge status: Home / Self Care | Payer: Medicare Other | Source: Ambulatory Visit | Attending: Internal Medicine | Admitting: Internal Medicine

## 2021-01-24 DIAGNOSIS — M79675 Pain in left toe(s): Secondary | ICD-10-CM

## 2021-01-24 DIAGNOSIS — M79674 Pain in right toe(s): Secondary | ICD-10-CM

## 2021-02-03 ENCOUNTER — Encounter: Payer: Self-pay | Admitting: Adult Health

## 2021-02-03 ENCOUNTER — Other Ambulatory Visit: Payer: Self-pay

## 2021-02-03 ENCOUNTER — Ambulatory Visit: Payer: Medicare Other | Admitting: Adult Health

## 2021-02-03 VITALS — BP 122/60 | HR 61

## 2021-02-03 DIAGNOSIS — E1165 Type 2 diabetes mellitus with hyperglycemia: Secondary | ICD-10-CM

## 2021-02-03 DIAGNOSIS — E785 Hyperlipidemia, unspecified: Secondary | ICD-10-CM

## 2021-02-03 DIAGNOSIS — I610 Nontraumatic intracerebral hemorrhage in hemisphere, subcortical: Secondary | ICD-10-CM | POA: Diagnosis not present

## 2021-02-03 DIAGNOSIS — Z794 Long term (current) use of insulin: Secondary | ICD-10-CM

## 2021-02-03 DIAGNOSIS — I10 Essential (primary) hypertension: Secondary | ICD-10-CM

## 2021-02-03 NOTE — Progress Notes (Signed)
[facility] [address] [place]. [place] [address] [phone]       STROKE FOLLOW UP NOTE  [name] Date of Birth:  [birth year] Medical Record Number:  [id number]   Reason for Referral: stroke follow up    SUBJECTIVE:   CHIEF COMPLAINT:  Chief Complaint  Patient presents with   Follow-up    Rm 3 with daughter here for 6 month f/u- reports she has been doing well since her last visit.     HPI:   Update 02/03/2021 JM: Returns for 74-month stroke follow-up accompanied by her daughter  Overall stable -denies new stroke/TIA symptoms Right sided weakness with some improvement since prior visit. -Reports she has been able to use right hand more.  Does report occasional dysarthria with increased fatigue.  Also reports improvement of cognition as well as short-term memory.  Continues to reside at [facility] living.  Compliant on aspirin and atorvastatin -denies side effects Blood pressure today 122/60 Recent A1c 9.5 up from 8.4 followed by endocrinology -reports recent adjustments made to diabetic regimen with glucose levels improving  No new concerns at this time     History provided for reference purposes only Update 07/29/2020 JM: [name] returns for 5-month stroke follow-up accompanied by her daughter, [name].  She continues to reside at [facility] living.  She has been doing well since prior visit with continued improvement of right hemiparesis.  Denies new stroke/TIA symptoms.  Repeat CT head showed resolution of ICH therefore aspirin and atorvastatin restarted without any associated side effects.  Blood pressure today 173/70 and on repeat 160/58 but does report not taking her afternoon hydralazine prior to leaving.  Routinely monitored at facility and typically stable.  Continues to follow with endocrinology for DM management.  No further concerns at this time  Initial visit 04/01/2020 JM: [name] is being seen for hospital  follow-up accompanied by her daughter. Due to continued deficits, family decided to transition patient to ALF [facility] after CIR discharge.  She continues to reside at ALF and reports residual right hemiparesis and occasional dysarthria.  Denies residual dysphagia.  She has been working with [facility] PT/OT 2x weekly with continued improvement.  Denies new or worsening stroke/TIA symptoms.  Blood pressure today 138/62.  Monitored at ALF and SBP typically 130s.  Stroke admission 01/04/2020 [name] is a 87 y.o. female with history of diabetes, hypertension, congestive heart failure, colon polyps who presented on 01/04/2020 with aphasia and R sided weakness which progressed to lethargy and emesis w/ assymetric pupils. Personally reviewed hospitalization pertinent progress notes, lab work and imaging with summary provided. Stroke work-up revealed left BG ICH likely secondary to hypertension. On aspirin 81 mg PTA and held during admission with recommended outpatient follow-up for repeat imaging and potential restart aspirin. History of HTN with elevated BP during admission with long-term BP goal normotensive range. Unable to calculate LDL due to elevated triglycerides which direct LDL 121 and recommended initiating statin at discharge in setting of acute hemorrhage. Controlled DM with A1c 8.6. Other stroke risk factors include advanced age, morbid obesity and family history of stroke. Hospital course complicated by acute hypoxic and hypercapnic respiratory failure in setting of hemorrhagic stroke, tracheobronchitis vs PNA with leukocytosis and acute encephalopathy on 11/15. Residual deficits of cognitive impairment, mild to moderate dysarthria, dysphagia, right facial weakness, right>left hemiparesis and gait impairment. Evaluated by therapies and recommended discharge to CIR for ongoing. Discharge to CIR on 01/16/2020.  ICH:   L BG ICH likely secondary to hypertension CT head L thalamic and basal ganglia  IPH w/ local mass effect and partial effacement 3rd ventricle w/ 36mm midline shift. Old pontine lacune. Anterior R frontal lobe meningioma vs calcification.  CT head after intubation unchanged MRI  No underlying basal ganglia lesion. Extensive small vessel disease.  MRA  Intracranial atherosclerosis.  Carotid Doppler bilateral 1-39% carotid stenosis. 2D Echo EF 55 to 60%. TG > 400 mg/dL, direct LDL 440.3  [id number] 8.6 VTE prophylaxis - Heparin subq aspirin 81 mg daily prior to admission, now on No antithrombotic given hemorrhage. Can restart ASA after bleed resolution, will need outpatient follow up for repeat imaging. Therapy recommendations:  CIR Disposition: CIR      ROS:   14 system review of systems performed and negative with exception of those listed in HPI  PMH:  Past Medical History:  Diagnosis Date   Colon polyp    Diabetes mellitus 1992   Hypertension 1996   Pancreatitis     PSH:  Past Surgical History:  Procedure Laterality Date   CHOLECYSTECTOMY     COLONOSCOPY     ERCP     PARTIAL HYSTERECTOMY  87years old   POLYPECTOMY     UPPER GASTROINTESTINAL ENDOSCOPY     WRIST FRACTURE SURGERY  left arm,87years old    Social History:  Social History   Socioeconomic History   Marital status: Widowed    Spouse name: Not on file   Number of children: Not on file   Years of education: Not on file   Highest education level: Not on file  Occupational History   Not on file  Tobacco Use   Smoking status: Never   Smokeless tobacco: Never  Vaping Use   Vaping Use: Never used  Substance and Sexual Activity   Alcohol use: Never   Drug use: Never   Sexual activity: Not on file  Other Topics Concern   Not on file  Social History Narrative   Not on file   Social Determinants of Health   Financial Resource Strain: Not on file  Food Insecurity: Not on file  Transportation Needs: Not on file  Physical Activity: Not on file  Stress: Not on file  Social  Connections: Not on file  Intimate Partner Violence: Not on file    Family History: No family history on file.  Medications:   Current Outpatient Medications on File Prior to Visit  Medication Sig Dispense Refill   ACCU-CHEK GUIDE test strip      acetaminophen (TYLENOL) 500 MG tablet Take 1 tablet (500 mg total) by mouth every 6 (six) hours as needed for mild pain. 30 tablet 0   albuterol (VENTOLIN HFA) 108 (90 Base) MCG/ACT inhaler Inhale 2 puffs into the lungs every 6 (six) hours as needed for wheezing or shortness of breath. 8 g 0   amantadine (SYMMETREL) 100 MG capsule Take 1 capsule (100 mg total) by mouth daily. 30 capsule 0   aspirin EC 81 MG tablet Take 1 tablet (81 mg total) by mouth daily. Swallow whole. 30 tablet 11   atorvastatin (LIPITOR) 40 MG tablet Take 1 tablet (40 mg total) by mouth daily. 90 tablet 0   azelastine (ASTELIN) 0.1 % nasal spray      Calcium Carbonate-Vit D-Min (CALCIUM 1200 PO)      chlorthalidone (HYGROTON) 25 MG tablet Take 1.5 tablets (37.5 mg total) by mouth daily. 30 tablet 0   CLONIDINE HCL PO  Continuous Blood Gluc Receiver (DEXCOM G6 RECEIVER) DEVI 1 each by Does not apply route daily. 1 each 3   Continuous Blood Gluc Sensor (DEXCOM G6 SENSOR) MISC 1 each by Does not apply route daily. 1 each 3   Continuous Blood Gluc Transmit (DEXCOM G6 TRANSMITTER) MISC 1 each by Does not apply route daily. 1 each 0   diclofenac Sodium (VOLTAREN) 1 % GEL Apply topically.     DROPSAFE SAFETY PEN NEEDLES 31G X 6 MM MISC      furosemide (LASIX) 20 MG tablet Take 0.5 tablets (10 mg total) by mouth See admin instructions. 30 tablet 0   HUMALOG KWIKPEN 100 UNIT/ML KwikPen Inject into the skin.     hydrALAZINE (APRESOLINE) 100 MG tablet Take 1 tablet (100 mg total) by mouth 3 (three) times daily. 90 tablet 0   insulin lispro (HUMALOG) 100 UNIT/ML injection Inject into the skin 3 (three) times daily before meals.     Insulin Syringe-Needle U-100 31G X 5/16" 1 ML MISC  1 application by Does not apply route 2 (two) times daily. 100 each 0   LANTUS SOLOSTAR 100 UNIT/ML Solostar Pen Inject into the skin.     loratadine (CLARITIN) 10 MG tablet Take by mouth.     LOSARTAN POTASSIUM PO      magnesium oxide (MAG-OX) 400 (240 Mg) MG tablet Take 1 tablet by mouth daily.     metoprolol tartrate (LOPRESSOR) 100 MG tablet Take 1 tablet (100 mg total) by mouth 2 (two) times daily. 60 tablet 0   pantoprazole (PROTONIX) 40 MG tablet Take 1 tablet (40 mg total) by mouth daily. 30 tablet 0   polyethylene glycol (MIRALAX / GLYCOLAX) 17 g packet Take 17 g by mouth 2 (two) times daily. 60 each 0   Potassium 99 MG TABS 1 tablet     potassium chloride (KLOR-CON) 10 MEQ tablet Take 10 mEq by mouth daily.     Pravastatin Sodium (PRAVACHOL PO)      QUEtiapine (SEROQUEL) 25 MG tablet Take 0.5 tablets (12.5 mg total) by mouth at bedtime as needed (anxiety). 15 tablet 0   senna-docusate (SENOKOT-S) 8.6-50 MG tablet Take 1 tablet by mouth 2 (two) times daily. 60 tablet 0   triamcinolone cream (KENALOG) 0.1 % Apply topically.     No current facility-administered medications on file prior to visit.    Allergies:   Allergies  Allergen Reactions   Amlodipine Other (See Comments)    EDEMA   Ace Inhibitors Cough    Other reaction(s): cough Other reaction(s): cough   Atorvastatin     Other reaction(s): muscle & joint aches, Other Other reaction(s): muscle & joint aches   Canagliflozin     INVOKANA Other reaction(s): urinary tract infections Other reaction(s): urinary tract infections   Clonidine Hcl     Other reaction(s): Other, Unknown Other reaction(s): Unknown   Amlodipine Besylate     Other reaction(s): Unknown Other reaction(s): Unknown   Morphine Sulfate     Other reaction(s): rash Other reaction(s): rash   Morphine And Related Rash and Other (See Comments)    For long periods of time (??)      OBJECTIVE:  Physical Exam  Vitals:   02/03/21 1445  BP: 122/60   Pulse: 61  SpO2: 95%    There is no height or weight on file to calculate BMI. No results found.  General: well developed, well nourished,  very pleasant elderly Caucasian female, seated, in no evident distress Head: head normocephalic  and atraumatic.   Neck: supple with no carotid or supraclavicular bruits Cardiovascular: regular rate and rhythm, no murmurs Musculoskeletal: no deformity Skin:  no rash/petichiae Vascular:  Normal pulses all extremities   Neurologic Exam Mental Status: Awake and fully alert.  Fluent speech and language. Oriented to place and time. Recent and remote memory intact. Attention span, concentration and fund of knowledge appropriate. Mood and affect appropriate.  Cranial Nerves: Pupils equal, briskly reactive to light. Extraocular movements full without nystagmus. Visual fields full to confrontation. Hearing intact. Facial sensation intact.  Right nasolabial fold flattening.  Tongue and palate moves normally and symmetrically.  Motor: Normal bulk and tone. Normal strength left upper and lower extremity.  Stable chronic right hemiparesis Sensory.: intact to touch , pinprick , position and vibratory sensation.  Coordination: Rapid alternating movements slightly decreased right hand. Finger-to-nose and heel-to-shin performed accurately on left side. Gait and Station: Deferred Reflexes: 1+ and symmetric. Toes downgoing.       ASSESSMENT: [name] [name] is a 87 y.o. year old female presented with aphasia and right-sided weakness which progressed to lethargy and emesis w/ asymmetric pupils on 01/04/2020 with stroke work-up revealing left basal ganglia ICH likely secondary to hypertension. Vascular risk factors include HTN, HLD, DM, morbid obesity and advanced age.      PLAN:  L BG ICH, hypertensive:  Residual deficit: Mild right hemiparesis, gait impairment and occasional dysarthria. Overall stable. Continue aspirin and atorvastatin for secondary stroke  prevention Repeat CT head showed resolution of ICH Discussed secondary stroke prevention measures and importance of close PCP follow up for aggressive stroke risk factor management  HTN: BP goal <130/90.  Stable on current regimen per PCP HLD: LDL goal <70.  On atorvastatin 40 mg daily monitored and managed by PCP DMII: A1c goal<7.0. prior A1c 9.4 (10/2020) -followed by endocrinology    Overall stable from stroke standpoint -consolidate care with PCP for continued aggressive stroke risk factor management. Follow up as needed for stroke   CC:  [name], MD    I spent 34 minutes of face-to-face and non-face-to-face time with patient and daughter.  This included previsit chart review, lab review, study review, electronic health record documentation, and patient and daughter education regarding prior stroke, residual deficits, education regarding secondary stroke prevention measures and importance of managing stroke risk factors and answered all other questions to patient and daughters satisfaction  Ihor Austin, [facility]  [facility] [address] [place], [place] [address]  Phone [phone] Fax [phone] Note: This document was prepared with digital dictation and possible smart phrase technology. Any transcriptional errors that result from this process are unintentional.

## 2021-02-03 NOTE — Patient Instructions (Signed)
Continue aspirin 81 mg daily  and atorvastatin for secondary stroke prevention  Continue to follow up with PCP/endocrinology regarding cholesterol, blood pressure and diabetes management  Maintain strict control of hypertension with blood pressure goal below 130/90, diabetes with hemoglobin A1c goal below 7.0 % and cholesterol with LDL cholesterol (bad cholesterol) goal below 70 mg/dL.      Overall stable from stroke standpoint -follow-up as needed      Thank you for coming to see [place] at [facility]. I hope we have been able to provide you high quality care today.  You may receive a patient satisfaction survey over the next few weeks. We would appreciate your feedback and comments so that we may continue to improve ourselves and the health of our patients.

## 2021-02-10 ENCOUNTER — Ambulatory Visit: Payer: Medicare Other | Admitting: Physical Medicine and Rehabilitation

## 2021-04-05 ENCOUNTER — Encounter: Payer: Self-pay | Admitting: Physical Medicine and Rehabilitation

## 2021-04-05 ENCOUNTER — Encounter
Payer: Medicare Other | Attending: Physical Medicine and Rehabilitation | Admitting: Physical Medicine and Rehabilitation

## 2021-04-05 ENCOUNTER — Other Ambulatory Visit: Payer: Self-pay

## 2021-04-05 VITALS — BP 160/55 | HR 64 | Ht 65.0 in

## 2021-04-05 DIAGNOSIS — M1711 Unilateral primary osteoarthritis, right knee: Secondary | ICD-10-CM | POA: Insufficient documentation

## 2021-04-05 NOTE — Progress Notes (Signed)
Knee injection, right  Indication: Right Knee pain not relieved by medication management and other conservative care.  Informed consent was obtained after describing risks and benefits of the procedure with the patient, this includes bleeding, bruising, infection and medication side effects. The patient wishes to proceed and has given written consent. The patient was placed in a recumbent position. The medial aspect of the knee was marked and prepped with Betadine and alcohol. It was then entered with a 25-gauge 1-1/2 inch needle and 1 mL of 1% lidocaine was injected into the skin and subcutaneous tissue.  After negative draw back for blood, a solution containing Zilretta was injected. The patient tolerated the procedure well. Post procedure instructions were given.  

## 2021-06-15 ENCOUNTER — Encounter (INDEPENDENT_AMBULATORY_CARE_PROVIDER_SITE_OTHER): Payer: Medicare Other | Admitting: Ophthalmology

## 2021-06-20 ENCOUNTER — Other Ambulatory Visit: Payer: Self-pay | Admitting: Family Medicine

## 2021-06-20 DIAGNOSIS — Z1231 Encounter for screening mammogram for malignant neoplasm of breast: Secondary | ICD-10-CM

## 2021-06-23 ENCOUNTER — Encounter (INDEPENDENT_AMBULATORY_CARE_PROVIDER_SITE_OTHER): Payer: Medicare Other | Admitting: Ophthalmology

## 2021-07-04 ENCOUNTER — Encounter (INDEPENDENT_AMBULATORY_CARE_PROVIDER_SITE_OTHER): Payer: Medicare Other | Admitting: Ophthalmology

## 2021-07-12 ENCOUNTER — Encounter
Payer: Medicare Other | Attending: Physical Medicine and Rehabilitation | Admitting: Physical Medicine and Rehabilitation

## 2021-07-12 ENCOUNTER — Encounter: Payer: Self-pay | Admitting: Physical Medicine and Rehabilitation

## 2021-07-12 VITALS — BP 159/81 | HR 67 | Ht 65.0 in

## 2021-07-12 DIAGNOSIS — M1711 Unilateral primary osteoarthritis, right knee: Secondary | ICD-10-CM | POA: Diagnosis present

## 2021-07-12 NOTE — Progress Notes (Signed)
Knee injection, bilateral ? ?Indication: Bilateral knee pain not relieved by medication management and other conservative care. ? ?Informed consent was obtained after describing risks and benefits of the procedure with the patient, this includes bleeding, bruising, infection and medication side effects. The patient wishes to proceed and has given written consent. The patient was placed in a recumbent position. The medial aspect of the knee was marked and prepped with Betadine and alcohol. It was then entered with a 25-gauge 1-1/2 inch needle and Zilretta was injected into the knee joint after negative draw back for blood. The patient tolerated the procedure well. Post procedure instructions were given.  ?

## 2021-07-18 ENCOUNTER — Ambulatory Visit (INDEPENDENT_AMBULATORY_CARE_PROVIDER_SITE_OTHER): Payer: Medicare Other | Admitting: Ophthalmology

## 2021-07-18 ENCOUNTER — Encounter (INDEPENDENT_AMBULATORY_CARE_PROVIDER_SITE_OTHER): Payer: Self-pay | Admitting: Ophthalmology

## 2021-07-18 DIAGNOSIS — Z961 Presence of intraocular lens: Secondary | ICD-10-CM

## 2021-07-18 DIAGNOSIS — H35372 Puckering of macula, left eye: Secondary | ICD-10-CM

## 2021-07-18 DIAGNOSIS — E113393 Type 2 diabetes mellitus with moderate nonproliferative diabetic retinopathy without macular edema, bilateral: Secondary | ICD-10-CM | POA: Insufficient documentation

## 2021-07-18 NOTE — Assessment & Plan Note (Signed)
Looks great stable OU

## 2021-07-18 NOTE — Assessment & Plan Note (Signed)
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  No current threat to visual functioning in either eye.  We will continue to observe

## 2021-07-18 NOTE — Progress Notes (Signed)
07/18/2021     CHIEF COMPLAINT Patient presents for  Chief Complaint  Patient presents with   Diabetic Retinopathy without Macular Edema      HISTORY OF PRESENT ILLNESS: [name] [name] is a 88 y.o. female who presents to the clinic today for:   HPI   10 mos fu OU oct fp. Patient states vision is stable and unchanged since last visit. Denies any new floaters or FOL. LBS: "a little over 100, I think it was 130, then I ate pancakes." A1C: 8.0 per patient, 1 month ago.  Last edited by Nelva Nay on 07/18/2021  3:00 PM.      Referring physician: Manning Charity, OD [address] [place],  [place] [address]  HISTORICAL INFORMATION:   Selected notes from the MEDICAL RECORD NUMBER    Lab Results  Component Value Date   HGBA1C 8.6 (H) 01/04/2020     CURRENT MEDICATIONS: No current outpatient medications on file. (Ophthalmic Drugs)   No current facility-administered medications for this visit. (Ophthalmic Drugs)   Current Outpatient Medications (Other)  Medication Sig   ACCU-CHEK GUIDE test strip    acetaminophen (TYLENOL) 500 MG tablet Take 1 tablet (500 mg total) by mouth every 6 (six) hours as needed for mild pain.   albuterol (VENTOLIN HFA) 108 (90 Base) MCG/ACT inhaler Inhale 2 puffs into the lungs every 6 (six) hours as needed for wheezing or shortness of breath.   amantadine (SYMMETREL) 100 MG capsule Take 1 capsule (100 mg total) by mouth daily.   aspirin EC 81 MG tablet Take 1 tablet (81 mg total) by mouth daily. Swallow whole.   atorvastatin (LIPITOR) 40 MG tablet Take 1 tablet (40 mg total) by mouth daily.   azelastine (ASTELIN) 0.1 % nasal spray    Calcium Carbonate-Vit D-Min (CALCIUM 1200 PO)    chlorthalidone (HYGROTON) 25 MG tablet Take 1.5 tablets (37.5 mg total) by mouth daily.   CLONIDINE HCL PO    Continuous Blood Gluc Receiver (DEXCOM G6 RECEIVER) DEVI 1 each by Does not apply route daily.   Continuous Blood Gluc Sensor (DEXCOM G6 SENSOR) MISC 1  each by Does not apply route daily.   Continuous Blood Gluc Transmit (DEXCOM G6 TRANSMITTER) MISC 1 each by Does not apply route daily.   diclofenac Sodium (VOLTAREN) 1 % GEL Apply topically.   DROPSAFE SAFETY PEN NEEDLES 31G X 6 MM MISC    furosemide (LASIX) 20 MG tablet Take 0.5 tablets (10 mg total) by mouth See admin instructions.   HUMALOG KWIKPEN 100 UNIT/ML KwikPen Inject into the skin.   hydrALAZINE (APRESOLINE) 100 MG tablet Take 1 tablet (100 mg total) by mouth 3 (three) times daily.   insulin lispro (HUMALOG) 100 UNIT/ML injection Inject into the skin 3 (three) times daily before meals.   Insulin Syringe-Needle U-100 31G X 5/16" 1 ML MISC 1 application by Does not apply route 2 (two) times daily.   LANTUS SOLOSTAR 100 UNIT/ML Solostar Pen Inject into the skin.   loratadine (CLARITIN) 10 MG tablet Take by mouth.   LOSARTAN POTASSIUM PO    magnesium oxide (MAG-OX) 400 (240 Mg) MG tablet Take 1 tablet by mouth daily.   metoprolol tartrate (LOPRESSOR) 100 MG tablet Take 1 tablet (100 mg total) by mouth 2 (two) times daily.   pantoprazole (PROTONIX) 40 MG tablet Take 1 tablet (40 mg total) by mouth daily.   polyethylene glycol (MIRALAX / GLYCOLAX) 17 g packet Take 17 g by mouth 2 (two)  times daily.   Potassium 99 MG TABS 1 tablet   potassium chloride (KLOR-CON) 10 MEQ tablet Take 10 mEq by mouth daily.   Pravastatin Sodium (PRAVACHOL PO)    QUEtiapine (SEROQUEL) 25 MG tablet Take 0.5 tablets (12.5 mg total) by mouth at bedtime as needed (anxiety).   senna-docusate (SENOKOT-S) 8.6-50 MG tablet Take 1 tablet by mouth 2 (two) times daily.   triamcinolone cream (KENALOG) 0.1 % Apply topically.   No current facility-administered medications for this visit. (Other)      REVIEW OF SYSTEMS: ROS   Negative for: Constitutional, Gastrointestinal, Neurological, Skin, Genitourinary, Musculoskeletal, HENT, Endocrine, Cardiovascular, Eyes, Respiratory, Psychiatric, Allergic/Imm, Heme/Lymph Last  edited by Edmon Crapeankin, Keano Guggenheim A, MD on 07/18/2021  3:47 PM.       ALLERGIES Allergies  Allergen Reactions   Amlodipine Other (See Comments)    EDEMA   Ace Inhibitors Cough    Other reaction(s): cough Other reaction(s): cough   Atorvastatin     Other reaction(s): muscle & joint aches, Other Other reaction(s): muscle & joint aches   Canagliflozin     INVOKANA Other reaction(s): urinary tract infections Other reaction(s): urinary tract infections   Clonidine Hcl     Other reaction(s): Other, Unknown Other reaction(s): Unknown   Amlodipine Besylate     Other reaction(s): Unknown Other reaction(s): Unknown   Morphine Sulfate     Other reaction(s): rash Other reaction(s): rash   Morphine And Related Rash and Other (See Comments)    For long periods of time (??)    PAST MEDICAL HISTORY Past Medical History:  Diagnosis Date   Colon polyp    Diabetes mellitus 1992   Hypertension 1996   Pancreatitis    Past Surgical History:  Procedure Laterality Date   CHOLECYSTECTOMY     COLONOSCOPY     ERCP     PARTIAL HYSTERECTOMY  [age]years old   POLYPECTOMY     UPPER GASTROINTESTINAL ENDOSCOPY     WRIST FRACTURE SURGERY  left arm,[age]years old    FAMILY HISTORY History reviewed. No pertinent family history.  SOCIAL HISTORY Social History   Tobacco Use   Smoking status: Never   Smokeless tobacco: Never  Vaping Use   Vaping Use: Never used  Substance Use Topics   Alcohol use: Never   Drug use: Never         OPHTHALMIC EXAM:  Base Eye Exam     Visual Acuity (ETDRS)       Right Left   Dist [place] 20/25 -1 20/30 -2   Dist ph [place]  20/25         Tonometry (Tonopen, 3:03 PM)       Right Left   Pressure 14 13         Pupils       Pupils APD   Right PERRL None   Left PERRL None         Visual Fields (Counting fingers)       Left Right    Full Full         Extraocular Movement       Right Left    Full Full         Neuro/Psych     Oriented x3: Yes    Mood/Affect: Normal         Dilation     Both eyes: 1.0% Mydriacyl, 2.5% Phenylephrine @ 3:03 PM           Slit Lamp and Fundus Exam  External Exam       Right Left   External Normal Normal         Slit Lamp Exam       Right Left   Lids/Lashes Normal Ptosis   Conjunctiva/Sclera White and quiet White and quiet   Cornea Clear Clear   Anterior Chamber Deep and quiet Deep and quiet   Iris Round and reactive Round and reactive   Lens Posterior chamber intraocular lens Posterior chamber intraocular lens   Anterior Vitreous Normal Normal         Fundus Exam       Right Left   Posterior Vitreous Normal Normal   Disc Normal Normal   C/D Ratio 0.7 0.6   Macula Microaneurysms Epiretinal membrane mild, Microaneurysms   Vessels NPDR- Mild NPDR- Mild   Periphery Normal Normal            IMAGING AND PROCEDURES  Imaging and Procedures for 07/18/21  OCT, Retina - OU - Both Eyes       Right Eye Quality was good. Scan locations included subfoveal. Central Foveal Thickness: 291. Progression has been stable.   Left Eye Quality was good. Scan locations included subfoveal. Central Foveal Thickness: 381. Progression has been stable. Findings include abnormal foveal contour.   Notes No active maculopathy     Color Fundus Photography Optos - OU - Both Eyes       Right Eye Progression has been stable. Disc findings include increased cup to disc ratio. Macula : microaneurysms. Periphery : normal observations.   Left Eye Progression has been stable. Disc findings include increased cup to disc ratio. Macula : microaneurysms. Periphery : normal observations.   Notes  Moderate nonproliferative diabetic retinopathy with small microaneurysms parafoveal OU.  We will monitor, watch temporally OS and inferotemporally OD             ASSESSMENT/PLAN:  Moderate nonproliferative diabetic retinopathy of both eyes without macular edema associated with type 2  diabetes mellitus (HCC) Moderate NPDR but no active maculopathy.  No current threat to visual functioning in either eye.  We will continue to observe  Pseudophakia, both eyes Looks great stable OU  Left epiretinal membrane Minor clinically but no impact on acuity observe     ICD-10-CM   1. Moderate nonproliferative diabetic retinopathy of both eyes without macular edema associated with type 2 diabetes mellitus (HCC)  E11.3393 OCT, Retina - OU - Both Eyes    Color Fundus Photography Optos - OU - Both Eyes    2. Pseudophakia, both eyes  Z96.1     3. Left epiretinal membrane  H35.372       1.  OU stable, with no active maculopathy and no reddening retinopathy in either eye.  2.  Follow-up Dr. Manning Charity as scheduled  3.  Ophthalmic Meds Ordered this visit:  No orders of the defined types were placed in this encounter.      Return in about 10 months (around 05/19/2022) for DILATE OU, COLOR FP, OCT.  There are no Patient Instructions on file for this visit.   Explained the diagnoses, plan, and follow up with the patient and they expressed understanding.  Patient expressed understanding of the importance of proper follow up care.   Alford Highland Denesha Brouse M.D. Diseases & Surgery of the Retina and Vitreous [facility] 07/18/21     Abbreviations: M myopia (nearsighted); A astigmatism; H hyperopia (farsighted); P presbyopia; Mrx spectacle prescription;  CTL contact lenses; OD  right eye; OS left eye; OU both eyes  XT exotropia; ET esotropia; PEK punctate epithelial keratitis; PEE punctate epithelial erosions; DES dry eye syndrome; MGD meibomian gland dysfunction; ATs artificial tears; PFAT's preservative free artificial tears; [place] nuclear sclerotic cataract; PSC posterior subcapsular cataract; ERM epi-retinal membrane; PVD posterior vitreous detachment; RD retinal detachment; DM diabetes mellitus; DR diabetic retinopathy; NPDR non-proliferative diabetic retinopathy; PDR  proliferative diabetic retinopathy; CSME clinically significant macular edema; DME diabetic macular edema; dbh dot blot hemorrhages; CWS cotton wool spot; POAG primary open angle glaucoma; C/D cup-to-disc ratio; HVF humphrey visual field; GVF goldmann visual field; OCT optical coherence tomography; IOP intraocular pressure; BRVO Branch retinal vein occlusion; CRVO central retinal vein occlusion; CRAO central retinal artery occlusion; BRAO branch retinal artery occlusion; RT retinal tear; SB scleral buckle; PPV pars plana vitrectomy; VH Vitreous hemorrhage; PRP panretinal laser photocoagulation; IVK intravitreal kenalog; VMT vitreomacular traction; MH Macular hole;  NVD neovascularization of the disc; NVE neovascularization elsewhere; AREDS age related eye disease study; ARMD age related macular degeneration; POAG primary open angle glaucoma; EBMD epithelial/anterior basement membrane dystrophy; ACIOL anterior chamber intraocular lens; IOL intraocular lens; PCIOL posterior chamber intraocular lens; Phaco/IOL phacoemulsification with intraocular lens placement; [place] photorefractive keratectomy; LASIK laser assisted in situ keratomileusis; HTN hypertension; DM diabetes mellitus; COPD chronic obstructive pulmonary disease

## 2021-07-18 NOTE — Assessment & Plan Note (Signed)
Minor clinically but no impact on acuity observe

## 2021-08-08 ENCOUNTER — Other Ambulatory Visit: Payer: Self-pay | Admitting: Physical Medicine and Rehabilitation

## 2021-08-23 ENCOUNTER — Ambulatory Visit: Payer: Medicare Other

## 2021-09-29 ENCOUNTER — Ambulatory Visit: Payer: Medicare Other

## 2021-10-13 ENCOUNTER — Encounter: Payer: Self-pay | Admitting: Physical Medicine and Rehabilitation

## 2021-10-13 ENCOUNTER — Encounter
Payer: Medicare Other | Attending: Physical Medicine and Rehabilitation | Admitting: Physical Medicine and Rehabilitation

## 2021-10-13 VITALS — BP 146/74 | HR 65 | Ht 65.0 in | Wt 217.0 lb

## 2021-10-13 DIAGNOSIS — M1711 Unilateral primary osteoarthritis, right knee: Secondary | ICD-10-CM | POA: Diagnosis present

## 2021-10-13 NOTE — Progress Notes (Signed)
Knee injection, right  Indication: Right knee pain not relieved by medication management and other conservative care.  Informed consent was obtained after describing risks and benefits of the procedure with the patient, this includes bleeding, bruising, infection and medication side effects. The patient wishes to proceed and has given written consent. The patient was placed in a recumbent position. The medial aspect of the knee was marked and prepped with Betadine and alcohol. It was then entered with a 25-gauge 1-1/2 inch needle and Zilretta was injected into the knee joint after negative draw back for blood. The patient tolerated the procedure well. Post procedure instructions were given.  

## 2021-10-25 ENCOUNTER — Ambulatory Visit
Admission: RE | Admit: 2021-10-25 | Discharge: 2021-10-25 | Disposition: A | Discharge status: Home / Self Care | Payer: Medicare Other | Source: Ambulatory Visit | Attending: Family Medicine | Admitting: Family Medicine

## 2021-10-25 DIAGNOSIS — Z1231 Encounter for screening mammogram for malignant neoplasm of breast: Secondary | ICD-10-CM

## 2021-10-27 ENCOUNTER — Ambulatory Visit: Payer: Medicare Other

## 2021-11-17 ENCOUNTER — Telehealth: Payer: Self-pay

## 2021-11-17 NOTE — Telephone Encounter (Signed)
Myriam Jacobson from [facility] called and stated she received the order forms for PT and OT, but the OT orders were not signed. She stated Dr. Ranell Patrick can call her [phone]

## 2021-11-21 NOTE — Telephone Encounter (Signed)
Left voicemail to return call to clinic.

## 2021-11-22 NOTE — Telephone Encounter (Signed)
Spoke with [place] [employer] and she stated the POC has to be signed so she is faxing it back and it needs to be signed by 11/24/21. I informed her that Dr. Ranell Patrick will not be back in the clinic until 11/24/21, She said that was fine

## 2022-01-08 ENCOUNTER — Other Ambulatory Visit: Payer: Self-pay

## 2022-01-08 ENCOUNTER — Emergency Department (HOSPITAL_BASED_OUTPATIENT_CLINIC_OR_DEPARTMENT_OTHER)
Admission: EM | Admit: 2022-01-08 | Discharge: 2022-01-08 | Disposition: A | Discharge status: Home / Self Care | Payer: Medicare Other | Attending: Emergency Medicine | Admitting: Emergency Medicine

## 2022-01-08 ENCOUNTER — Other Ambulatory Visit (HOSPITAL_BASED_OUTPATIENT_CLINIC_OR_DEPARTMENT_OTHER): Payer: Medicare Other | Admitting: Radiology

## 2022-01-08 ENCOUNTER — Encounter (HOSPITAL_BASED_OUTPATIENT_CLINIC_OR_DEPARTMENT_OTHER): Payer: Self-pay

## 2022-01-08 ENCOUNTER — Emergency Department (HOSPITAL_BASED_OUTPATIENT_CLINIC_OR_DEPARTMENT_OTHER): Payer: Medicare Other | Admitting: Radiology

## 2022-01-08 DIAGNOSIS — Z20822 Contact with and (suspected) exposure to covid-19: Secondary | ICD-10-CM | POA: Insufficient documentation

## 2022-01-08 DIAGNOSIS — R059 Cough, unspecified: Secondary | ICD-10-CM | POA: Diagnosis present

## 2022-01-08 DIAGNOSIS — J189 Pneumonia, unspecified organism: Secondary | ICD-10-CM | POA: Insufficient documentation

## 2022-01-08 DIAGNOSIS — Z79899 Other long term (current) drug therapy: Secondary | ICD-10-CM | POA: Insufficient documentation

## 2022-01-08 DIAGNOSIS — E119 Type 2 diabetes mellitus without complications: Secondary | ICD-10-CM | POA: Insufficient documentation

## 2022-01-08 DIAGNOSIS — Z794 Long term (current) use of insulin: Secondary | ICD-10-CM | POA: Insufficient documentation

## 2022-01-08 DIAGNOSIS — D72829 Elevated white blood cell count, unspecified: Secondary | ICD-10-CM | POA: Diagnosis not present

## 2022-01-08 DIAGNOSIS — I10 Essential (primary) hypertension: Secondary | ICD-10-CM | POA: Diagnosis not present

## 2022-01-08 DIAGNOSIS — Z7982 Long term (current) use of aspirin: Secondary | ICD-10-CM | POA: Diagnosis not present

## 2022-01-08 LAB — COMPREHENSIVE METABOLIC PANEL
ALT: 21 U/L (ref 0–44)
AST: 21 U/L (ref 15–41)
Albumin: 3.8 g/dL (ref 3.5–5.0)
Alkaline Phosphatase: 70 U/L (ref 38–126)
Anion gap: 10 (ref 5–15)
BUN: 28 mg/dL — ABNORMAL HIGH (ref 8–23)
CO2: 29 mmol/L (ref 22–32)
Calcium: 8.7 mg/dL — ABNORMAL LOW (ref 8.9–10.3)
Chloride: 100 mmol/L (ref 98–111)
Creatinine, Ser: 1.45 mg/dL — ABNORMAL HIGH (ref 0.44–1.00)
GFR, Estimated: 35 mL/min — ABNORMAL LOW (ref >60)
Glucose, Bld: 221 mg/dL — ABNORMAL HIGH (ref 70–99)
Potassium: 3.5 mmol/L (ref 3.5–5.1)
Sodium: 139 mmol/L (ref 135–145)
Total Bilirubin: 0.3 mg/dL (ref 0.3–1.2)
Total Protein: 7.1 g/dL (ref 6.5–8.1)

## 2022-01-08 LAB — CBC WITH DIFFERENTIAL/PLATELET
Abs Immature Granulocytes: 0.13 10*3/uL — ABNORMAL HIGH (ref 0.00–0.07)
Basophils Absolute: 0.1 10*3/uL (ref 0.0–0.1)
Basophils Relative: 1 %
Eosinophils Absolute: 0.3 10*3/uL (ref 0.0–0.5)
Eosinophils Relative: 3 %
HCT: 37.3 % (ref 36.0–46.0)
Hemoglobin: 11.8 g/dL — ABNORMAL LOW (ref 12.0–15.0)
Immature Granulocytes: 1 %
Lymphocytes Relative: 13 %
Lymphs Abs: 1.5 10*3/uL (ref 0.7–4.0)
MCH: 27.8 pg (ref 26.0–34.0)
MCHC: 31.6 g/dL (ref 30.0–36.0)
MCV: 87.8 fL (ref 80.0–100.0)
Monocytes Absolute: 1 10*3/uL (ref 0.1–1.0)
Monocytes Relative: 8 %
Neutro Abs: 9 10*3/uL — ABNORMAL HIGH (ref 1.7–7.7)
Neutrophils Relative %: 74 %
Platelets: 334 10*3/uL (ref 150–400)
RBC: 4.25 MIL/uL (ref 3.87–5.11)
RDW: 14.8 % (ref 11.5–15.5)
WBC: 12.1 10*3/uL — ABNORMAL HIGH (ref 4.0–10.5)
nRBC: 0 % (ref 0.0–0.2)

## 2022-01-08 LAB — RESP PANEL BY RT-PCR (FLU A&B, COVID) ARPGX2
Influenza A by PCR: NEGATIVE
Influenza B by PCR: NEGATIVE
SARS Coronavirus 2 by RT PCR: NEGATIVE

## 2022-01-08 MED ORDER — CEFTRIAXONE SODIUM 1 G IJ SOLR
1.0000 g | Freq: Once | INTRAMUSCULAR | Status: AC
  Administered 2022-01-08: 1 g via INTRAMUSCULAR
  Filled 2022-01-08: qty 10

## 2022-01-08 MED ORDER — STERILE WATER FOR INJECTION IJ SOLN
INTRAMUSCULAR | Status: AC
  Filled 2022-01-08: qty 10

## 2022-01-08 MED ORDER — BENZONATATE 100 MG PO CAPS: 100.0000 mg | ORAL_CAPSULE | Freq: Three times a day (TID) | ORAL | 0 refills | Status: DC

## 2022-01-08 MED ORDER — DOXYCYCLINE HYCLATE 100 MG PO CAPS: 100.0000 mg | ORAL_CAPSULE | Freq: Two times a day (BID) | ORAL | 0 refills | Status: AC

## 2022-01-08 NOTE — ED Triage Notes (Signed)
Pt arrives via POV from with family from [place].  Pt was recently seen and treated for pneumonia. Pt completed her abx 1 week ago and was feeling better. She states for the past couple of days she is now experiencing a productive cough again. Denies any other associated symptoms.

## 2022-01-08 NOTE — ED Notes (Signed)
Discharge instructions discussed with pt. Pt verbalized understanding. Pt stable and ambulatory.  °

## 2022-01-08 NOTE — ED Provider Notes (Signed)
[facility] EMERGENCY DEPT Provider Note   CSN: [id number] Arrival date & time: 01/08/22  0908     History  Chief Complaint  Patient presents with   Cough    [name] [name] is a 88 y.o. female.  Patient presents with family with a complaint of productive cough.  She was diagnosed a little over a week ago with pneumonia and treated with azithromycin.  She states she completed her antibiotics last Sunday and was feeling slightly better before the productive cough returned.  She denies fevers, abdominal pain, nausea, vomiting, chest pain, shortness of breath.  Past medical history significant for type 2 diabetes, hyperlipidemia, hypertension  HPI     Home Medications Prior to Admission medications   Medication Sig Start Date End Date Taking? Authorizing Provider  benzonatate (TESSALON) 100 MG capsule Take 1 capsule (100 mg total) by mouth every 8 (eight) hours. 01/08/22  Yes Darrick Grinder, PA-C  doxycycline (VIBRAMYCIN) 100 MG capsule Take 1 capsule (100 mg total) by mouth 2 (two) times daily for 7 days. 01/08/22 01/15/22 Yes Darrick Grinder, PA-C  ACCU-CHEK GUIDE test strip  11/08/20   [provider]  acetaminophen (TYLENOL) 500 MG tablet Take by mouth.    [provider]  albuterol (VENTOLIN HFA) 108 (90 Base) MCG/ACT inhaler 1 puff as needed    [provider]  amantadine (SYMMETREL) 100 MG capsule Take 1 capsule (100 mg total) by mouth daily. 02/12/20   Love, Evlyn Kanner, PA-C  ASPERCREME/ALOE 10 % cream Apply topically. 09/14/21   [provider]  ASPIRIN 81 PO SMARTSIG:1 Tablet(s) By Mouth Daily 10/07/21   [provider]  atorvastatin (LIPITOR) 40 MG tablet Take 1 tablet (40 mg total) by mouth daily. 04/13/20   Ihor Austin, NP  azelastine (ASTELIN) 0.1 % nasal spray  06/22/20   [provider]  Calcium Carbonate-Vit D-Min (CALCIUM 1200 PO)     [provider]  chlorthalidone (HYGROTON) 25 MG  tablet Take 1.5 tablets (37.5 mg total) by mouth daily. 02/12/20   Love, Evlyn Kanner, PA-C  chlorthalidone (HYGROTON) 50 MG tablet Take 1 tablet by mouth daily. Patient not taking: Reported on 10/13/2021    [provider]  CLONIDINE HCL PO     [provider]  Continuous Blood Gluc Receiver (DEXCOM G6 RECEIVER) DEVI 1 each by Does not apply route daily. 10/28/20   Raulkar, Drema Pry, MD  Continuous Blood Gluc Sensor (DEXCOM G6 SENSOR) MISC 1 each by Does not apply route daily. 10/28/20   Raulkar, Drema Pry, MD  Continuous Blood Gluc Transmit (DEXCOM G6 TRANSMITTER) MISC 1 each by Does not apply route daily. 10/28/20   Raulkar, Drema Pry, MD  dapagliflozin propanediol (FARXIGA) 5 MG TABS tablet 1 tablet Patient not taking: Reported on 10/13/2021 09/22/21   [provider]  diclofenac Sodium (VOLTAREN) 1 % GEL Apply topically. Patient not taking: Reported on 10/13/2021 02/12/20   [provider]  DROPSAFE SAFETY PEN NEEDLES 31G X 6 MM MISC  10/20/20   [provider]  furosemide (LASIX) 20 MG tablet Take 0.5 tablets (10 mg total) by mouth See admin instructions. 02/12/20   Love, Evlyn Kanner, PA-C  HUMALOG KWIKPEN 100 UNIT/ML KwikPen Inject into the skin. 11/01/20   [provider]  hydrALAZINE (APRESOLINE) 100 MG tablet Take 1 tablet (100 mg total) by mouth 3 (three) times daily. 02/12/20   Love, Evlyn Kanner, PA-C  insulin glargine (LANTUS) 100 UNIT/ML injection Inject into the skin.  02/12/20   [provider]  insulin isophane & regular human KwikPen (HUMULIN 70/30 KWIKPEN) (70-30) 100 UNIT/ML KwikPen INJECT 100 UNITS SUBCUTANEOUSLY BEFORE BREAKFAST THEN INJECT 60 TO 80 UNITS BEFORE LUNCH AND THEN 70 UNITS BEFORE EVENING MEAL AS DIRECTED INJECT THREE TIMES DAILY    [provider]  insulin lispro (HUMALOG) 100 UNIT/ML injection Inject into the skin 3 (three) times daily before meals.    [provider]  insulin NPH-regular Human (NOVOLIN  70/30) (70-30) 100 UNIT/ML injection     [provider]  Insulin Syringe-Needle U-100 31G X 5/16" 1 ML MISC 1 application by Does not apply route 2 (two) times daily. 02/12/20   Love, Evlyn Kanner, PA-C  LANTUS SOLOSTAR 100 UNIT/ML Solostar Pen Inject into the skin. 11/08/20   [provider]  lidocaine (LIDODERM) 5 % APPLY (1) PATCH TO LOWER BACK ONCE DAILY. LEAVE ON 12 HOURS. LEAVE OFF 12 HOURS. 08/08/21   Raulkar, Drema Pry, MD  Lidocaine HCl (ASPERCREME LIDOCAINE) 4 % CREA 1 application    [provider]  loratadine (CLARITIN) 10 MG tablet 1 tablet    [provider]  losartan (COZAAR) 100 MG tablet Take 1 tablet by mouth daily. Patient not taking: Reported on 10/13/2021    [provider]  magnesium oxide (MAG-OX) 400 MG tablet 1 tablet as needed    [provider]  metoprolol (TOPROL-XL) 200 MG 24 hr tablet Take 1 tablet by mouth daily. Patient not taking: Reported on 10/13/2021    [provider]  metoprolol tartrate (LOPRESSOR) 100 MG tablet Take 1 tablet (100 mg total) by mouth 2 (two) times daily. 02/12/20   Love, Evlyn Kanner, PA-C  miconazole (BAZA ANTIFUNGAL) 2 % cream 1 application    [provider]  pantoprazole (PROTONIX) 40 MG tablet Take 1 tablet (40 mg total) by mouth daily. 02/12/20 02/11/21  Love, Evlyn Kanner, PA-C  polyethylene glycol (MIRALAX / GLYCOLAX) 17 g packet Take 17 g by mouth 2 (two) times daily. 02/12/20   Love, Evlyn Kanner, PA-C  Potassium 99 MG TABS 1 tablet Patient not taking: Reported on 10/13/2021    [provider]  potassium chloride (KLOR-CON) 10 MEQ tablet Take 10 mEq by mouth daily. 07/10/20   [provider]  Pravastatin Sodium (PRAVACHOL PO)     [provider]  QUEtiapine (SEROQUEL) 25 MG tablet Take 0.5 tablets (12.5 mg total) by mouth at bedtime as needed (anxiety). 03/23/20   Jones Bales, NP  senna-docusate (SENOKOT-S) 8.6-50 MG tablet Take 1 tablet by mouth 2  (two) times daily. 02/12/20   Love, Evlyn Kanner, PA-C  sulfamethoxazole-trimethoprim (BACTRIM DS) 800-160 MG tablet Take 1 tablet by mouth 2 (two) times daily. Patient not taking: Reported on 10/13/2021 08/17/21   [provider]  triamcinolone cream (KENALOG) 0.1 % Apply topically. 10/01/20   [provider]      Allergies    Amlodipine, Ace inhibitors, Atorvastatin, Canagliflozin, Clonidine hcl, Amlodipine besylate, Atorvastatin calcium, Buprenorphine, Morphine sulfate, and Morphine and related    Review of Systems   Review of Systems  Respiratory:  Positive for cough. Negative for shortness of breath.   Cardiovascular:  Negative for chest pain.    Physical Exam Updated Vital Signs BP (!) 163/48   Pulse 65   Temp 98.5 F (36.9 C)   Resp 20   Ht 5\' 6"  (1.676 m)   Wt 99.8 kg   SpO2 97%   BMI 35.51 kg/m  Physical Exam  HENT:     Head: Normocephalic and atraumatic.  Eyes:     Pupils: Pupils are equal, round, and reactive to light.  Cardiovascular:     Rate and Rhythm: Normal rate and regular rhythm.  Pulmonary:     Effort: Pulmonary effort is normal. No respiratory distress.     Breath sounds: Normal breath sounds.  Musculoskeletal:        General: No signs of injury.     Cervical back: Normal range of motion.  Skin:    General: Skin is dry.  Neurological:     Mental Status: She is alert.  Psychiatric:        Speech: Speech normal.        Behavior: Behavior normal.     ED Results / Procedures / Treatments   Labs (all labs ordered are listed, but only abnormal results are displayed) Labs Reviewed  CBC WITH DIFFERENTIAL/PLATELET - Abnormal; Notable for the following components:      Result Value   WBC 12.1 (*)    Hemoglobin 11.8 (*)    Neutro Abs 9.0 (*)    Abs Immature Granulocytes 0.13 (*)    All other components within normal limits  COMPREHENSIVE METABOLIC PANEL - Abnormal; Notable for the following components:   Glucose, Bld 221 (*)    BUN 28  (*)    Creatinine, Ser 1.45 (*)    Calcium 8.7 (*)    GFR, Estimated 35 (*)    All other components within normal limits  RESP PANEL BY RT-PCR (FLU A&B, COVID) [id number]    EKG None  Radiology DG Chest 2 View  Result Date: 01/08/2022 CLINICAL DATA:  Recently seen and treated for pneumonia. Completed antibiotics 1 week ago and was feeling better. Recurrent productive cough beginning a couple of days ago. EXAM: CHEST - 2 VIEW COMPARISON:  01/13/2020 and older studies. FINDINGS: Cardiac silhouette mildly enlarged.  No mediastinal or hilar masses. Small foci of calcification projects over the upper lungs, which may be vascular or healed granuloma, stable. Lungs otherwise clear. No pleural effusion or pneumothorax. Skeletal structures are grossly intact. IMPRESSION: 1. No acute cardiopulmonary disease.  No evidence of pneumonia. Electronically Signed   By: Amie Portland M.D.   On: 01/08/2022 11:11    Procedures Procedures    Medications Ordered in ED Medications  cefTRIAXone (ROCEPHIN) injection 1 g (has no administration in time range)    ED Course/ Medical Decision Making/ A&P                           Medical Decision Making Amount and/or Complexity of Data Reviewed Labs: ordered. Radiology: ordered.  Risk Prescription drug management.   Patient presents with a chief complaint of cough.  Differential diagnosis includes but is not limited to pneumonia, viral infection including COVID-19, influenza, adenovirus, and others  I reviewed the patient's past medical history and saw her primary care note where she was diagnosed with pneumonia and prescribed azithromycin for 5 days  I ordered and reviewed labs.  Pertinent results include WBC 12.1; creatinine 1.45, better than patient's baseline; negative respiratory panel  I ordered and interpreted imaging including plain chest x-ray.  X-ray shows no acute abnormalities at this time.  I agree with radiologist findings  With the  patient's recent pneumonia diagnosis, age, and productive cough, I think it is reasonable to treat with another course of antibiotics.  I ordered Rocephin here in the hospital.  She tolerated  the injection well.  Plan to discharge home with a course of doxycycline and with Tessalon Perles for cough suppression.  There is no indication at this time for further emergent work-up.  Patient may discharge back to her facility at this time.        Final Clinical Impression(s) / ED Diagnoses Final diagnoses:  Community acquired pneumonia, unspecified laterality    Rx / DC Orders ED Discharge Orders          Ordered    doxycycline (VIBRAMYCIN) 100 MG capsule  2 times daily        01/08/22 1216    benzonatate (TESSALON) 100 MG capsule  Every 8 hours        01/08/22 1216              Pamala DuffelMcCauley, Katalea Ucci B, PA-C 01/08/22 1220    Terald Sleeperrifan, Matthew J, MD 01/08/22 1504

## 2022-01-08 NOTE — Discharge Instructions (Signed)
You were seen today for a follow-up for your recent pneumonia diagnosis.  I have prescribed you an antibiotic to be taken as directed.  You are also prescribed benzonatate which is a cough medicine.  Please follow-up as needed with your primary care provider if you do not see improvement.

## 2022-01-13 ENCOUNTER — Ambulatory Visit: Payer: Medicare Other | Admitting: Physical Medicine and Rehabilitation

## 2022-01-16 ENCOUNTER — Encounter: Payer: Self-pay | Admitting: Physical Medicine and Rehabilitation

## 2022-01-16 ENCOUNTER — Encounter
Payer: Medicare Other | Attending: Physical Medicine and Rehabilitation | Admitting: Physical Medicine and Rehabilitation

## 2022-01-16 VITALS — BP 153/74 | HR 65 | Ht 66.0 in

## 2022-01-16 DIAGNOSIS — M1711 Unilateral primary osteoarthritis, right knee: Secondary | ICD-10-CM | POA: Diagnosis not present

## 2022-01-16 MED ORDER — TRIAMCINOLONE ACETONIDE 32 MG IX SRER
32.0000 mg | Freq: Once | INTRA_ARTICULAR | Status: AC
  Administered 2022-01-16: 32 mg via INTRA_ARTICULAR

## 2022-01-16 NOTE — Progress Notes (Signed)
Knee injection, right  Indication: Right knee pain not relieved by medication management and other conservative care.  Informed consent was obtained after describing risks and benefits of the procedure with the patient, this includes bleeding, bruising, infection and medication side effects. The patient wishes to proceed and has given written consent. The patient was placed in a recumbent position. The medial aspect of the knee was marked and prepped with Betadine and alcohol. It was then entered with a 25-gauge 1-1/2 inch needle and Zilretta was injected into the knee joint after negative draw back for blood. The patient tolerated the procedure well. Post procedure instructions were given.

## 2022-03-27 ENCOUNTER — Encounter
Payer: Medicare Other | Attending: Physical Medicine and Rehabilitation | Admitting: Physical Medicine and Rehabilitation

## 2022-03-27 ENCOUNTER — Encounter: Payer: Self-pay | Admitting: Physical Medicine and Rehabilitation

## 2022-03-27 VITALS — BP 166/74 | HR 60 | Ht 66.0 in

## 2022-03-27 DIAGNOSIS — M1711 Unilateral primary osteoarthritis, right knee: Secondary | ICD-10-CM | POA: Diagnosis not present

## 2022-03-27 MED ORDER — MICONAZOLE NITRATE 2 % EX CREA: TOPICAL_CREAM | Freq: Two times a day (BID) | CUTANEOUS | 3 refills | Status: DC

## 2022-03-27 NOTE — Progress Notes (Addendum)
Subjective:    Patient ID: [name] [name], female    DOB: 02-Apr-1932, 89 y.o.   MRN: [id number]  HPI: [name] [name] is a 89 y.o. female who is here for HFU appointment regarding her Nontraumatic acute hemorrhage of basal ganglia, Essential Hypertension, Primary Osteoarthritis of Right Knee and  Controlled Type 2 Diabetes Mellitus with hyperglycemia with long-term current insulin use. [name] [name] was brought to [facility] via EMS on 01/04/2020 after she collapsed in church during service, while standing. Neurology consulted.  CT Head WO Contrast:  IMPRESSION: 1.3 x 2.7 x 1.5 cm acute parenchymal hemorrhage centered within the left thalamus and basal ganglia. Local mass effect with partial effacement of the third ventricle and 2 mm rightward midline shift at the level of the septum pellucidum.   Mild cerebral atrophy with moderate chronic small vessel ischemic disease.   Known chronic pontine lacunar infarct.   7 mm focus of lobular dural-based calcification versus incidental small calcified meningioma overlying the anterior right frontal lobe.   IMPRESSION: Left thalamic hemorrhage unchanged from earlier today. No new hemorrhage or hydrocephalus.   Moderate chronic microvascular ischemic change in the white matter.  MR Head WO Contrast:  IMPRESSION: 1. No visible mass or vascular lesion underlying the left thalamic hematoma. 2. Extensive chronic small vessel ischemia. 3. Intracranial atherosclerosis.  [name] [name] was admitted to inpatient Rehabilitation on 01/16/2020 and discharged to [facility] on 02/12/2020. She is receiving outpatient therapy with a contracted agency at [facility] per social work discharge summary Lurena Joiner, [place] LCSW.  She states she has pain in her right eye, no drainage noted. She will F/U with her PCP.  Also reports right knee pain requesting cortisone injection, she will be scheduled with Dr Allena Katz, she verbalizes understanding.  She rated her pain on The Health and History 0. Also reports she has a good appetite.    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  She was in sales  The Arletta Bale has benefitted her greatly initially but the last injection did not provide any relief She has also been having more swelling in the right knee She was able to do her daily functions -COVID really slowed her prior progress -she has been having the EMLA cream -BP elevated today and patient says it usually is 140 systolic Voltaren gel has already been helping.  She has a BM one every other day.   Pain Inventory Average Pain 0 Pain Right Now 0 My pain is aching   LOCATION OF PAIN  lower back pain  BOWEL Number of stools per week: 5 Oral laxative use Yes  Type of laxative sennokot  Enema or suppository use No  History of colostomy No  Incontinent No   BLADDER Normal In and out cath, frequency n/a  Able to self cath  n/a Bladder incontinence No  Frequent urination No  Leakage with coughing Yes  Difficulty starting stream No  Incomplete bladder emptying No    Mobility walk with assistance use a walker ability to climb steps?  no do you drive?  no use a wheelchair needs help with transfers Do you have any goals in this area?  yes  Function retired I need assistance with the following:  dressing, bathing, meal prep, and household duties  Neuro/Psych weakness numbness trouble walking  Prior Studies N/a  Physicians involved in your care N/a   History reviewed. No  pertinent family history. Social History   Socioeconomic History   Marital status: Widowed    Spouse name: Not on file   Number of children: Not on file   Years of education: Not on file   Highest education level: Not on file  Occupational History   Not on file  Tobacco Use   Smoking status: Never   Smokeless tobacco: Never  Vaping Use   Vaping Use: Never  used  Substance and Sexual Activity   Alcohol use: Never   Drug use: Never   Sexual activity: Not on file  Other Topics Concern   Not on file  Social History Narrative   Not on file   Social Determinants of Health   Financial Resource Strain: Not on file  Food Insecurity: Not on file  Transportation Needs: Not on file  Physical Activity: Not on file  Stress: Not on file  Social Connections: Not on file   Past Surgical History:  Procedure Laterality Date   CHOLECYSTECTOMY     COLONOSCOPY     ERCP     PARTIAL HYSTERECTOMY  88years old   POLYPECTOMY     UPPER GASTROINTESTINAL ENDOSCOPY     WRIST FRACTURE SURGERY  left arm,89years old   Past Medical History:  Diagnosis Date   Colon polyp    Diabetes mellitus 1992   Hypertension 1996   Pancreatitis    BP (!) 174/71   Pulse 60   Ht 5\' 6"  (1.676 m)   BMI 35.51 kg/m   Opioid Risk Score:   Fall Risk Score:  `1  Depression screen PHQ 2/9     03/27/2022    1:50 PM 01/16/2022    1:23 PM 10/13/2021   11:33 AM 07/12/2021   11:43 AM 04/05/2021   12:56 PM 01/13/2021   11:28 AM 10/28/2020   11:00 AM  Depression screen PHQ 2/9  Decreased Interest 0 0 0 0 0 0 0  Down, Depressed, Hopeless 0 0 0 0 0 0 0  PHQ - 2 Score 0 0 0 0 0 0 0    Review of Systems  Constitutional: Negative.   HENT: Negative.    Eyes: Negative.   Respiratory: Negative.    Cardiovascular: Negative.        Limb swelling , right arm  Gastrointestinal: Negative.   Endocrine: Negative.        High blood sugar  Genitourinary: Negative.   Musculoskeletal:  Positive for back pain and gait problem.  Allergic/Immunologic: Negative.   Neurological:  Positive for weakness and numbness.  Hematological: Negative.   Psychiatric/Behavioral: Negative.    All other systems reviewed and are negative.      Objective:   Physical Exam Vitals and nursing note reviewed. BP 174/71 Constitutional:      Appearance: Normal appearance.  Cardiovascular:     Rate and  Rhythm: Normal rate and regular rhythm.     Pulses: Normal pulses.     Heart sounds: Normal heart sounds.  Pulmonary:     Effort: Pulmonary effort is normal.     Breath sounds: Normal breath sounds.  Musculoskeletal:     Cervical back: Normal range of motion and neck supple.     Comments: Normal Muscle Bulk and Muscle Testing Reveals:  Upper Extremities: Full ROM and Muscle Strength 5/5 Lower Extremities: Full ROM and Muscle Strength 5/5 Arrived in wheelchair  Right knee TTP and swollen Skin:    General: Skin is warm and dry.  Neurological:  Mental Status: She is alert and oriented to person, place, and time.  Psychiatric:        Mood and Affect: Mood normal.        Behavior: Behavior normal.         Assessment & Plan:  1.Nontraumatic acute hemorrhage of basal ganglia: Continue Outpatient Therapy.  She has a scheduled appointment with Neurology.   2.Essential Hypertension: Continue current medication regimen. PCP following. Continue to monitor.  -BP is 174/71 today.  -Advised checking BP daily at home and logging results to bring into follow-up appointment with PCP and myself. -Reviewed BP meds today.  -Advised regarding healthy foods that can help lower blood pressure and provided with a list: 1) citrus foods- high in vitamins and minerals 2) salmon and other fatty fish - reduces inflammation and oxylipins 3) swiss chard (leafy green)- high level of nitrates 4) pumpkin seeds- one of the best natural sources of magnesium 5) Beans and lentils- high in fiber, magnesium, and potassium 6) Berries- high in flavonoids 7) Amaranth (whole grain, can be cooked similarly to rice and oats)- high in magnesium and fiber 8) Pistachios- even more effective at reducing BP than other nuts 9) Carrots- high in phenolic compounds that relax blood vessels and reduce inflammation 10) Celery- contain phthalides that relax tissues of arterial walls 11) Tomatoes- can also improve cholesterol and  reduce risk of heart disease 12) Broccoli- good source of magnesium, calcium, and potassium 13) Greek yogurt: high in potassium and calcium 14) Herbs and spices: Celery seed, cilantro, saffron, lemongrass, black cumin, ginseng, cinnamon, cardamom, sweet basil, and ginger 15) Chia and flax seeds- also help to lower cholesterol and blood sugar 16) Beets- high levels of nitrates that relax blood vessels  17) spinach and bananas- high in potassium  -Provided lise of supplements that can help with hypertension:  1) magnesium: one high quality brand is Bioptemizers since it contains all 7 types of magnesium, otherwise over the counter magnesium gluconate 400mg  is a good option 2) B vitamins 3) vitamin D 4) potassium 5) CoQ10 6) L-arginine 7) Vitamin C 8) Beetroot -Educated that goal BP is 120/80. -Made goal to incorporate some of the above foods into diet.     3.Controlled Type 2 Diabetes Mellitus with hyperglycemia with long-term current insulin use. PCP following.  4. Primary Osteoarthritis of Right Knee: will schedule for Zilretta injection in 3 months 5. Aphasia: recommended continuing speech, educated that progress can continue in the first 2 years 48. Impaired mobility: start taking daily walks outside.  7. Constipation:  -Provided list of following foods that help with constipation and highlighted a few: 1) prunes- contain high amounts of fiber.  2) apples- has a form of dietary fiber called pectin that accelerates stool movement and increases beneficial gut bacteria 3) pears- in addition to fiber, also high in fructose and sorbitol which have laxative effect 4) figs- contain an enzyme ficin which helps to speed colonic transit 5) kiwis- contain an enzyme actinidin that improves gut motility and reduces constipation 6) oranges- rich in pectin (like apples) 7) grapefruits- contain a flavanol naringenin which has a laxative effect 8) vegetables- rich in fiber and also great sources of  folate, vitamin C, and K 9) artichoke- high in inulin, prebiotic great for the microbiome 10) chicory- increases stool frequency and softness (can be added to coffee) 11) rhubarb- laxative effect 12) sweet potato- high fiber 13) beans, peas, and lentils- contain both soluble and insoluble fiber 14) chia seeds- improves intestinal  health and gut flora 15) flaxseeds- laxative effect 16) whole grain rye bread- high in fiber 17) oat bran- high in soluble and insoluble fiber 18) kefir- softens stools -recommended to try at least one of these foods every day.  -drink 6-8 glasses of water per day -walk regularly, especially after meals.   4) Right knee pain: -discussed trying next Zilretta injection under fluoroscopic guidance with Dr. Benjie Karvonen  -Discussed Zynex heating/cooling blanket and Nexwave but patient defers  5) Hypoglycemic episodes: -discussed that she does not feel very right when she drops low    40 minutes spent in discussion of how last Zilretta injection did not help right knee pain though previous injections had, that she has been having right knee swelling and a difficult time participating with PT, pain is more present when she stands up to walk than at rest, discussed trying repeat Zilretta under fluoroscopic guidance with Dr. Benjie Karvonen next visit to enhance likelihood of accurate placement, discussed Synvisc and imaging as well though she defers these options for now, discussed applying voltaren gel to right knee up to 4 times per day

## 2022-03-27 NOTE — Addendum Note (Signed)
Addended by: Izora Ribas on: 03/27/2022 02:54 PM   Modules accepted: Level of Service

## 2022-03-28 ENCOUNTER — Other Ambulatory Visit: Payer: Self-pay | Admitting: Physical Medicine and Rehabilitation

## 2022-04-10 ENCOUNTER — Ambulatory Visit: Payer: Medicare Other | Admitting: Physical Medicine and Rehabilitation

## 2022-04-24 ENCOUNTER — Encounter
Payer: Medicare Other | Attending: Physical Medicine and Rehabilitation | Admitting: Physical Medicine and Rehabilitation

## 2022-04-24 VITALS — BP 176/74 | HR 58

## 2022-04-24 DIAGNOSIS — E1165 Type 2 diabetes mellitus with hyperglycemia: Secondary | ICD-10-CM | POA: Diagnosis present

## 2022-04-24 DIAGNOSIS — M25561 Pain in right knee: Secondary | ICD-10-CM | POA: Diagnosis present

## 2022-04-24 DIAGNOSIS — R4701 Aphasia: Secondary | ICD-10-CM | POA: Diagnosis present

## 2022-04-24 MED ORDER — DICLOFENAC SODIUM 1 % EX GEL: 2.0000 g | Freq: Four times a day (QID) | CUTANEOUS | 3 refills | Status: AC

## 2022-04-24 NOTE — Patient Instructions (Signed)
Red light therapy

## 2022-04-24 NOTE — Progress Notes (Signed)
Subjective:    Patient ID: [name] [name], female    DOB: 01-20-1933, 89 y.o.   MRN: [id number]  HPI: [name] [name] is a 89 y.o. female who is here for follow-up appointment regarding her Nontraumatic acute hemorrhage of basal ganglia, Essential Hypertension, Primary Osteoarthritis of Right Knee and  Controlled Type 2 Diabetes Mellitus with hyperglycemia with long-term current insulin use. [name] [name] was brought to [facility] via EMS on 01/04/2020 after she collapsed in church during service, while standing. Neurology consulted.  CT Head WO Contrast:  IMPRESSION: 1.3 x 2.7 x 1.5 cm acute parenchymal hemorrhage centered within the left thalamus and basal ganglia. Local mass effect with partial effacement of the third ventricle and 2 mm rightward midline shift at the level of the septum pellucidum.   Mild cerebral atrophy with moderate chronic small vessel ischemic disease.   Known chronic pontine lacunar infarct.   7 mm focus of lobular dural-based calcification versus incidental small calcified meningioma overlying the anterior right frontal lobe.   IMPRESSION: Left thalamic hemorrhage unchanged from earlier today. No new hemorrhage or hydrocephalus.   Moderate chronic microvascular ischemic change in the white matter.  MR Head WO Contrast:  IMPRESSION: 1. No visible mass or vascular lesion underlying the left thalamic hematoma. 2. Extensive chronic small vessel ischemia. 3. Intracranial atherosclerosis.  [name] [name] was admitted to inpatient Rehabilitation on 01/16/2020 and discharged to [facility] on 02/12/2020. She is receiving outpatient therapy with a contracted agency at [facility] per social work discharge [place] E[place], [place] LCSW.  She states she has pain in her right eye, no drainage noted. She will F/U with her PCP.  Also reports right knee pain requesting cortisone injection, she will be scheduled with Dr Posey Pronto, she verbalizes  understanding. She rated her pain on The Health and History 0. Also reports she has a good appetite.    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  -she is willing to try blud emu oil.   Pain Inventory Average Pain 0 Pain Right Now 0 My pain is aching   LOCATION OF PAIN  lower back pain  BOWEL Number of stools per week: 5 Oral laxative use Yes  Type of laxative sennokot  Enema or suppository use No  History of colostomy No  Incontinent No   BLADDER Normal In and out cath, frequency n/a  Able to self cath  n/a Bladder incontinence No  Frequent urination No  Leakage with coughing Yes  Difficulty starting stream No  Incomplete bladder emptying No    Mobility walk with assistance use a walker ability to climb steps?  no do you drive?  no use a wheelchair needs help with transfers Do you have any goals in this area?  yes  Function retired I need assistance with the following:  dressing, bathing, meal prep,  and household duties  Neuro/Psych weakness numbness trouble walking  Prior Studies N/a  Physicians involved in your care N/a   No family history on file. Social History   Socioeconomic History   Marital status: Widowed    Spouse name: Not on file   Number of children: Not on file   Years of education: Not on file   Highest education level: Not on file  Occupational History    Not on file  Tobacco Use   Smoking status: Never   Smokeless tobacco: Never  Vaping Use   Vaping Use: Never used  Substance and Sexual Activity   Alcohol use: Never   Drug use: Never   Sexual activity: Not on file  Other Topics Concern   Not on file  Social History Narrative   Not on file   Social Determinants of Health   Financial Resource Strain: Not on file  Food Insecurity: Not on file  Transportation Needs: Not on file  Physical Activity: Not on file  Stress: Not on file  Social Connections: Not on file   Past Surgical History:  Procedure Laterality Date   CHOLECYSTECTOMY     COLONOSCOPY     ERCP     PARTIAL HYSTERECTOMY  89years old   POLYPECTOMY     UPPER GASTROINTESTINAL ENDOSCOPY     WRIST FRACTURE SURGERY  left arm,89years old   Past Medical History:  Diagnosis Date   Colon polyp    Diabetes mellitus 1992   Hypertension 1996   Pancreatitis    There were no vitals taken for this visit.  Opioid Risk Score:   Fall Risk Score:  `1  Depression screen PHQ 2/9     03/27/2022    1:50 PM 01/16/2022    1:23 PM 10/13/2021   11:33 AM 07/12/2021   11:43 AM 04/05/2021   12:56 PM 01/13/2021   11:28 AM 10/28/2020   11:00 AM  Depression screen PHQ 2/9  Decreased Interest 0 0 0 0 0 0 0  Down, Depressed, Hopeless 0 0 0 0 0 0 0  PHQ - 2 Score 0 0 0 0 0 0 0    Review of Systems  Constitutional: Negative.   HENT: Negative.    Eyes: Negative.   Respiratory: Negative.    Cardiovascular: Negative.        Limb swelling , right arm  Gastrointestinal: Negative.   Endocrine: Negative.        High blood sugar  Genitourinary: Negative.   Musculoskeletal:  Positive for back pain and gait problem.  Allergic/Immunologic: Negative.   Neurological:  Positive for weakness and numbness.  Hematological: Negative.   Psychiatric/Behavioral: Negative.    All other systems reviewed and are negative.      Objective:   Physical Exam Vitals and nursing note reviewed. BP  176/74 Constitutional:      Appearance: Normal appearance.  Cardiovascular:     Rate and Rhythm: Normal rate and regular rhythm.     Pulses: Normal pulses.     Heart sounds: Normal heart sounds.  Pulmonary:     Effort: Pulmonary effort is normal.     Breath sounds: Normal breath sounds.  Musculoskeletal:     Cervical back: Normal range of motion and neck supple.     Comments: Normal Muscle Bulk and Muscle Testing Reveals:  Upper Extremities: Full ROM and Muscle Strength 5/5 Lower Extremities: Full ROM and Muscle Strength 5/5 Arrived in wheelchair  Right knee TTP and swollen Skin:  General: Skin is warm and dry.  Neurological:     Mental Status: She is alert and oriented to person, place, and time.  Psychiatric:        Mood and Affect: Mood normal.        Behavior: Behavior normal.         Assessment & Plan:  1.Nontraumatic acute hemorrhage of basal ganglia: Continue Outpatient Therapy.  She has a scheduled appointment with Neurology.   2.Essential Hypertension: Continue current medication regimen. PCP following. Continue to monitor.  -BP is 174/71 today. Discussed that it may because she did not check her BP at home.  -runs 130/125 at home on the top number -Advised checking BP daily at home and logging results to bring into follow-up appointment with PCP and myself. -Reviewed BP meds today.  -Advised regarding healthy foods that can help lower blood pressure and provided with a list: 1) citrus foods- high in vitamins and minerals 2) salmon and other fatty fish - reduces inflammation and oxylipins 3) swiss chard (leafy green)- high level of nitrates 4) pumpkin seeds- one of the best natural sources of magnesium 5) Beans and lentils- high in fiber, magnesium, and potassium 6) Berries- high in flavonoids 7) Amaranth (whole grain, can be cooked similarly to rice and oats)- high in magnesium and fiber 8) Pistachios- even more effective at reducing BP than other nuts 9)  Carrots- high in phenolic compounds that relax blood vessels and reduce inflammation 10) Celery- contain phthalides that relax tissues of arterial walls 11) Tomatoes- can also improve cholesterol and reduce risk of heart disease 12) Broccoli- good source of magnesium, calcium, and potassium 13) Greek yogurt: high in potassium and calcium 14) Herbs and spices: Celery seed, cilantro, saffron, lemongrass, black cumin, ginseng, cinnamon, cardamom, sweet basil, and ginger 15) Chia and flax seeds- also help to lower cholesterol and blood sugar 16) Beets- high levels of nitrates that relax blood vessels  17) spinach and bananas- high in potassium  -Provided lise of supplements that can help with hypertension:  1) magnesium: one high quality brand is Bioptemizers since it contains all 7 types of magnesium, otherwise over the counter magnesium gluconate '400mg'$  is a good option 2) B vitamins 3) vitamin D 4) potassium 5) CoQ10 6) L-arginine 7) Vitamin C 8) Beetroot -Educated that goal BP is 120/80. -Made goal to incorporate some of the above foods into diet.     3.Controlled Type 2 Diabetes Mellitus with hyperglycemia with long-term current insulin use. PCP following.  -discussed CBGs -discussed freestyle libre and it did not work correctly.   5. Aphasia: recommended continuing speech, educated that progress can continue in the first 2 years  42. Impaired mobility: start taking daily walks outside.   7. Constipation:  -Provided list of following foods that help with constipation and highlighted a few: 1) prunes- contain high amounts of fiber.  2) apples- has a form of dietary fiber called pectin that accelerates stool movement and increases beneficial gut bacteria 3) pears- in addition to fiber, also high in fructose and sorbitol which have laxative effect 4) figs- contain an enzyme ficin which helps to speed colonic transit 5) kiwis- contain an enzyme actinidin that improves gut motility and  reduces constipation 6) oranges- rich in pectin (like apples) 7) grapefruits- contain a flavanol naringenin which has a laxative effect 8) vegetables- rich in fiber and also great sources of folate, vitamin C, and K 9) artichoke- high in inulin, prebiotic great for the microbiome 10) chicory- increases  stool frequency and softness (can be added to coffee) 11) rhubarb- laxative effect 12) sweet potato- high fiber 13) beans, peas, and lentils- contain both soluble and insoluble fiber 14) chia seeds- improves intestinal health and gut flora 15) flaxseeds- laxative effect 16) whole grain rye bread- high in fiber 17) oat bran- high in soluble and insoluble fiber 18) kefir- softens stools -recommended to try at least one of these foods every day.  -drink 6-8 glasses of water per day -walk regularly, especially after meals.   4) Right knee pain: -discussed trying next Zilretta injection under fluoroscopic guidance with Dr. Marciano Sequin -voltaren gel 4 times per day  -Discussed Zynex heating/cooling blanket and Nexwave but patient defers -recommend blue emu oil -prednisone '1mg'$  prn discussed -recommended quad strengthening exercises -will order neoprene knee sleeve  5) Hypoglycemic episodes: -discussed that she does not feel very right when she drops low

## 2022-05-01 ENCOUNTER — Other Ambulatory Visit: Payer: Self-pay | Admitting: Physical Medicine and Rehabilitation

## 2022-05-01 ENCOUNTER — Telehealth: Payer: Self-pay

## 2022-05-01 NOTE — Telephone Encounter (Signed)
This patient is scheduled 05/30/22 with Dr. Marciano Sequin for a zilretta knee injection under fluoro per your last note, Dr. Marciano Sequin doesn't doe procedures in fluoro room, was this suppose to be under ultrasound  w/ Dr Marciano Sequin ?

## 2022-05-22 ENCOUNTER — Encounter (INDEPENDENT_AMBULATORY_CARE_PROVIDER_SITE_OTHER): Payer: Medicare Other | Admitting: Ophthalmology

## 2022-05-30 ENCOUNTER — Encounter: Payer: Medicare Other | Admitting: Physical Medicine & Rehabilitation

## 2022-05-30 ENCOUNTER — Encounter: Payer: Medicare Other | Attending: Physical Medicine and Rehabilitation | Admitting: Physical Medicine & Rehabilitation

## 2022-05-30 ENCOUNTER — Encounter: Payer: Self-pay | Admitting: Physical Medicine & Rehabilitation

## 2022-05-30 DIAGNOSIS — M25561 Pain in right knee: Secondary | ICD-10-CM | POA: Diagnosis present

## 2022-05-30 DIAGNOSIS — M1711 Unilateral primary osteoarthritis, right knee: Secondary | ICD-10-CM | POA: Diagnosis not present

## 2022-05-30 MED ORDER — TRIAMCINOLONE ACETONIDE 32 MG IX SRER
32.0000 mg | Freq: Once | INTRA_ARTICULAR | Status: AC
  Administered 2022-05-30: 32 mg via INTRA_ARTICULAR

## 2022-05-30 NOTE — Progress Notes (Signed)
RIGHT Knee injection With fluoro guidance  Indication:RIGHT Knee pain not relieved by medication management and other conservative care.  Informed consent was obtained after describing risks and benefits of the procedure with the patient, this includes bleeding, bruising, infection and medication side effects. The patient wishes to proceed and has given written consent. The patient was placed in a recumbent position. The medial aspect of the knee was marked and prepped with Betadine and alcohol. It was then entered with a 25-gauge 1-1/2 inch needle and 1 mL of 1% lidocaine was injected into the skin and subcutaneous tissue. Then another 21g 2"needle was inserted into the knee joint. After negative draw back for blood, a solution containing 5 ML of Zilretta (32mg  of triamcinolone ER) followed by 4 ml of 1% lidocaine . The patient tolerated the procedure well. Post procedure instructions were given.

## 2022-05-30 NOTE — Progress Notes (Signed)
  PROCEDURE RECORD [facility]   Name: [name] [name] DOB:12-02-1932 MRN: [id number]  Date:05/30/2022  Physician: Alysia Penna, MD    Nurse/CMA: Vyron Fronczak RMA   Allergies:  Allergies  Allergen Reactions   Amlodipine Other (See Comments)    EDEMA   Ace Inhibitors Cough    Other reaction(s): cough Other reaction(s): cough Other reaction(s): cough   Atorvastatin     Other reaction(s): muscle & joint aches, Other Other reaction(s): muscle & joint aches   Canagliflozin     INVOKANA Other reaction(s): urinary tract infections Other reaction(s): urinary tract infections Other reaction(s): urinary tract infections   Clonidine Hcl     Other reaction(s): Other, Unknown Other reaction(s): Unknown Other reaction(s): Unknown   Amlodipine Besylate     Other reaction(s): Unknown Other reaction(s): Unknown Other reaction(s): Unknown   Atorvastatin Calcium     Other reaction(s): muscle & joint aches   Buprenorphine Other (See Comments)   Morphine Sulfate     Other reaction(s): rash Other reaction(s): rash Other reaction(s): rash   Morphine And Related Rash and Other (See Comments)    For long periods of time (??)    Consent Signed: Yes.    Is patient diabetic? Yes.    CBG today? 120  Pregnant: No. LMP: No LMP recorded. Patient is postmenopausal. (age 79-55)  Anticoagulants: no Anti-inflammatory: no Antibiotics: no  Procedure: Zilretta knee Injection Under Fluoroscopic Guidance   Position: Supine Start Time: 2:36  End Time: 2:40PM  Fluoro Time: 11  RN/CMA Mason Burleigh RMA  Dailyn Reith RMA    Time 2:18pm 2:43    BP 161/70 158/ 62    Pulse 62 84    Respirations 16 16    O2 Sat 95 95    S/S 6 6    Pain Level 8/10 0/10     D/C home with Daughter , patient A & O X 3, D/C instructions reviewed, and sits independently.

## 2022-06-26 IMAGING — DX DG CHEST 1V
1 series · 1 of 1 positions shown · non-contrast
Comparison: Chest x-ray 03/05/2013.

CLINICAL DATA: 87-year-old female status post intubation.

EXAM:
CHEST  1 VIEW

[chest]
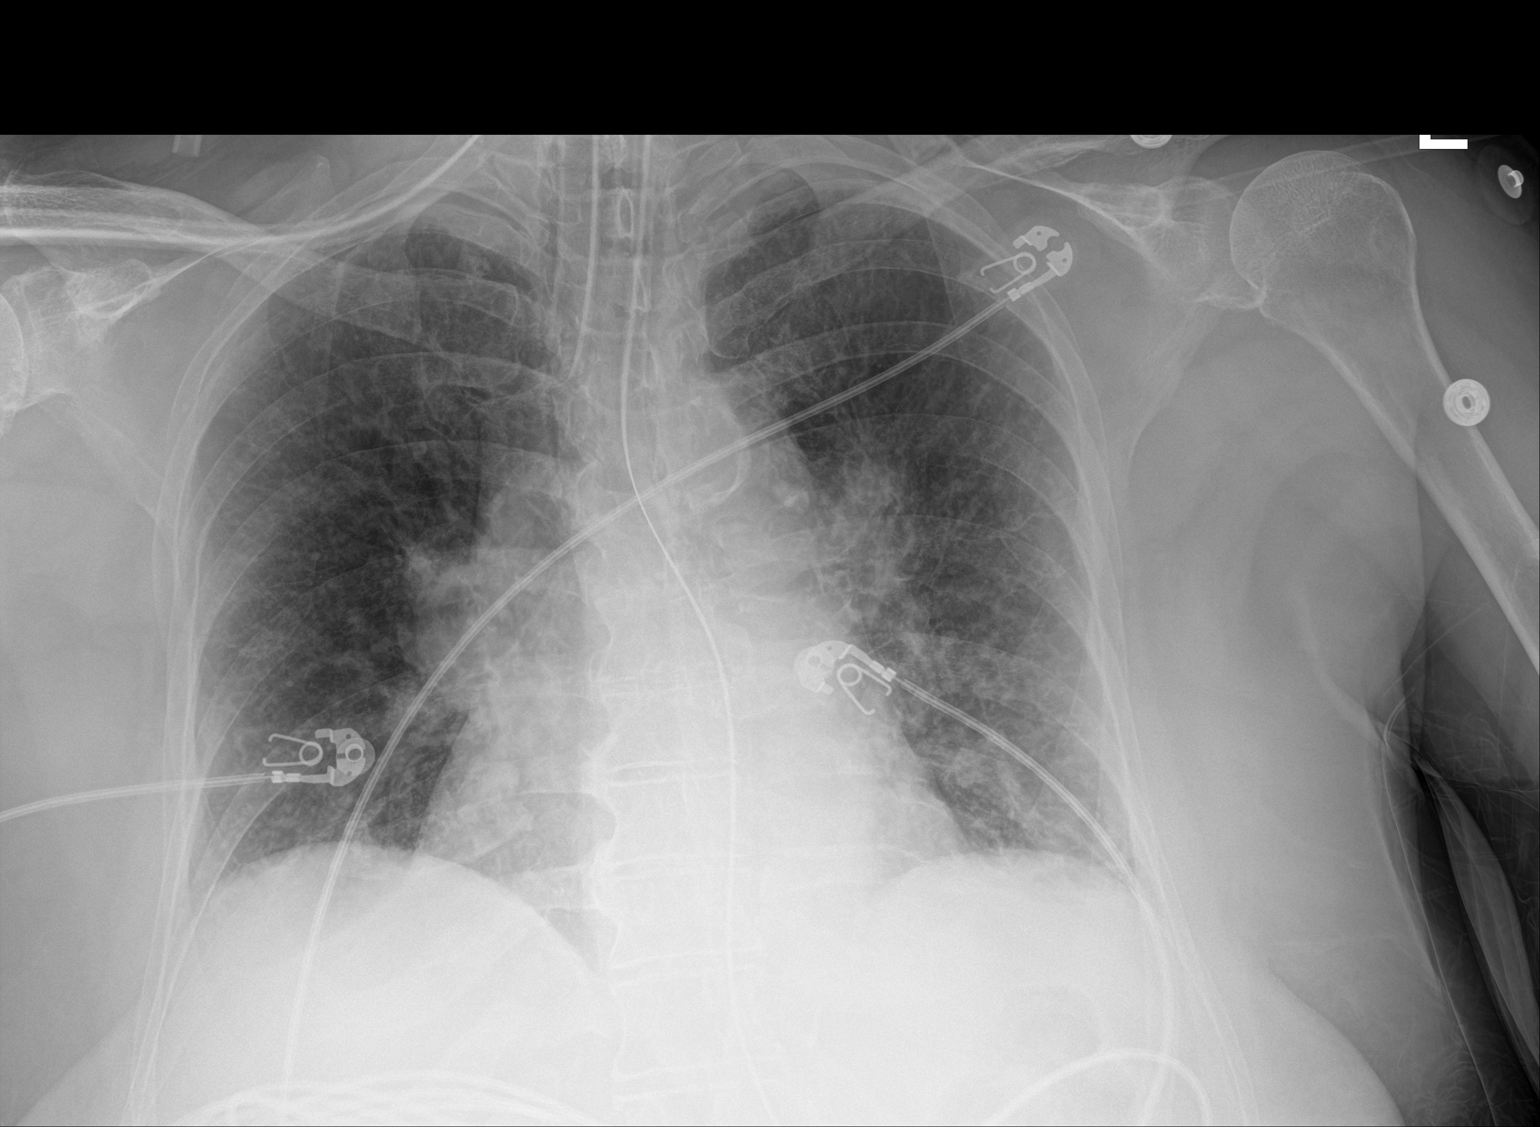

[1 of 1 positions shown; findings below may reference images not displayed]

FINDINGS: An endotracheal tube is in place with tip 4.2 cm above the carina. A
nasogastric tube is seen extending into the stomach, however, the
tip of the nasogastric tube extends below the lower margin of the
image. Lung volumes are slightly low. There is cephalization of the
pulmonary vasculature and slight indistinctness of the interstitial
markings suggestive of mild pulmonary edema. No pleural effusions.
No pneumothorax. Mild cardiomegaly. The patient is rotated to the
right on today's exam, resulting in distortion of the mediastinal
contours and reduced diagnostic sensitivity and specificity for
mediastinal pathology. Aortic atherosclerosis.
IMPRESSION: 1. Support apparatus, as above.
2. Low lung volumes with evidence of mild congestive heart failure,
as above.
3. Aortic atherosclerosis.

## 2022-06-26 IMAGING — CT CT HEAD W/O CM
4 series · 15 of 47 positions shown, 17 images · non-contrast
Comparison: CT head 01/04/2020, approximately 1 hour prior to the
current study.

CLINICAL DATA: Hypertensive intracranial hemorrhage.

EXAM:
CT HEAD WITHOUT CONTRAST
TECHNIQUE: Contiguous axial images were obtained from the base of the skull
through the vertex without intravenous contrast.

[Series 3: head without · axial · non-contrast · 0.44mm/px · z∈[-160,-40]mm · 7 of 34 slices shown, 9 images]
[im 5/34  brain]
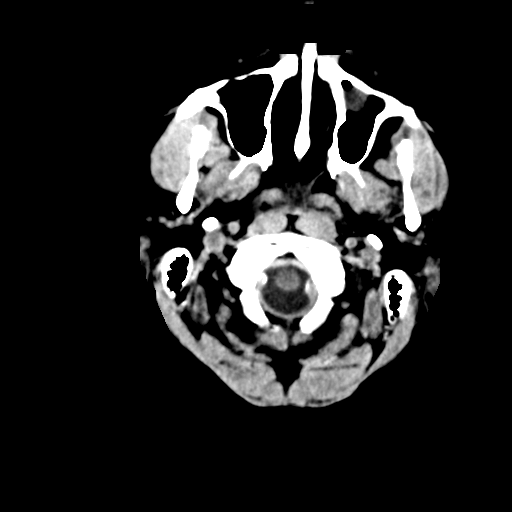
[im 5/34  bone]
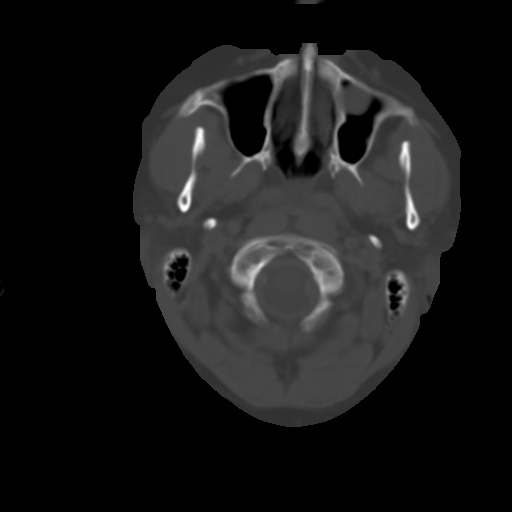
[im 9/34  brain]
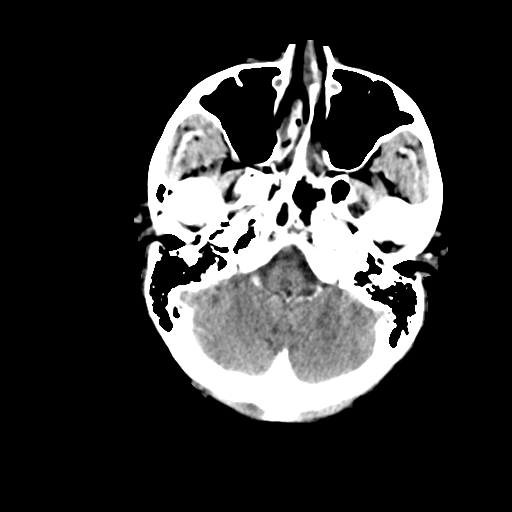
[im 13/34  brain]
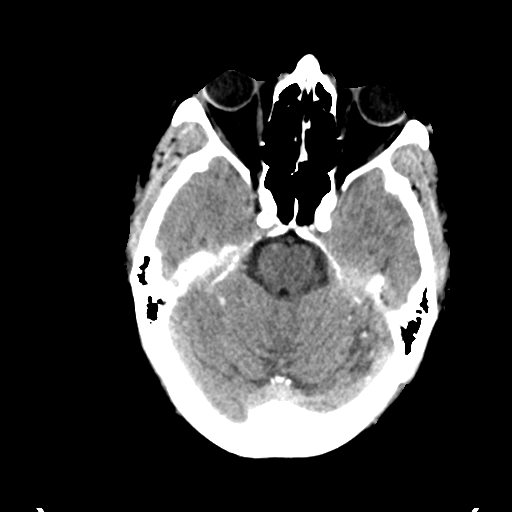
[im 17/34  brain]
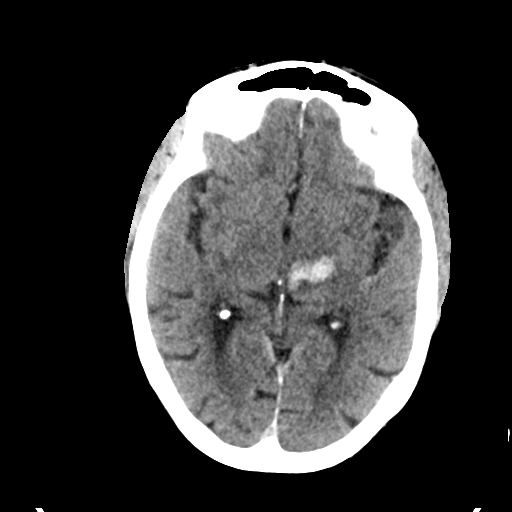
[im 21/34  brain]
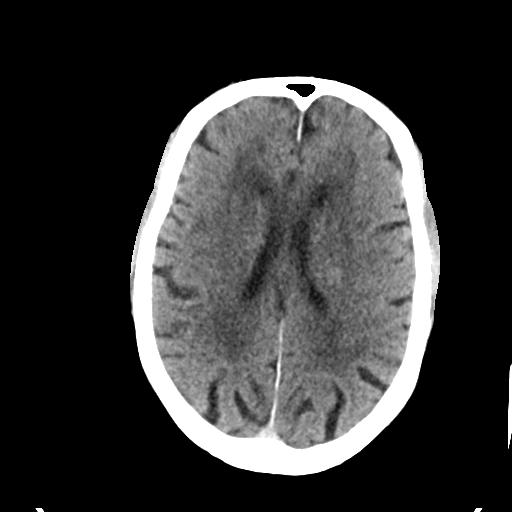
[im 21/34  bone]
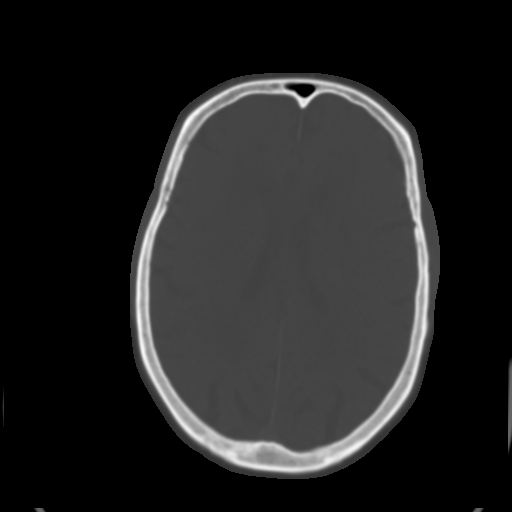
[im 25/34  brain]
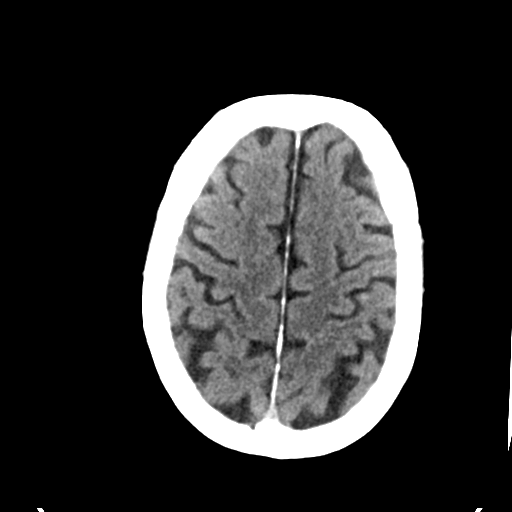
[im 29/34  brain]
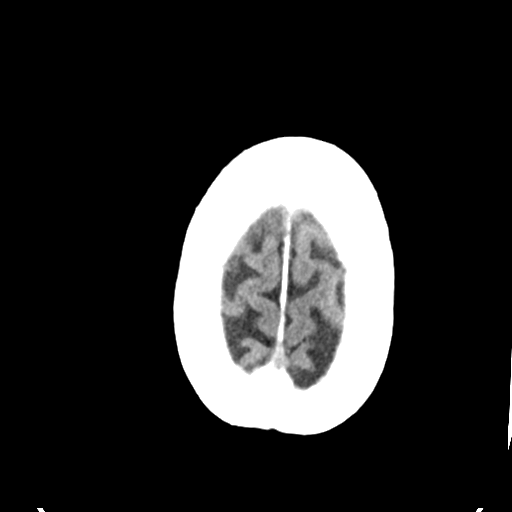

[Series 4: head bone · axial · 0.44mm/px · z∈[-164,-148]mm · 2 of 84 slices shown]
[im 9/84  bone]
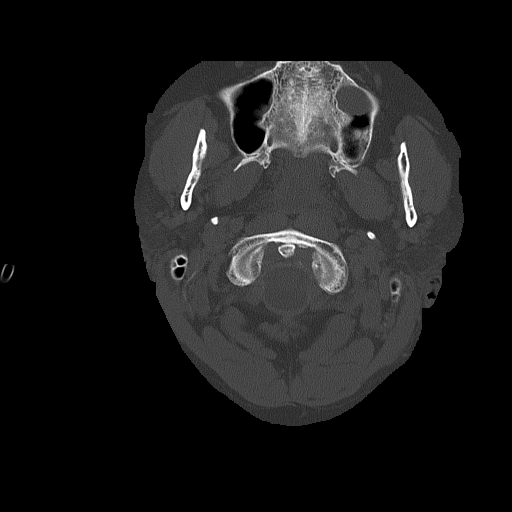
[im 17/84  bone]
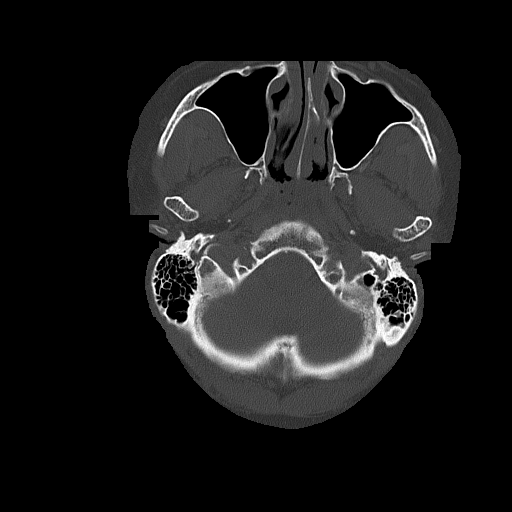

[Series 5: head without cor · coronal · non-contrast · 0.33mm/px · 3 of 67 slices shown]
[im 23/67  brain]
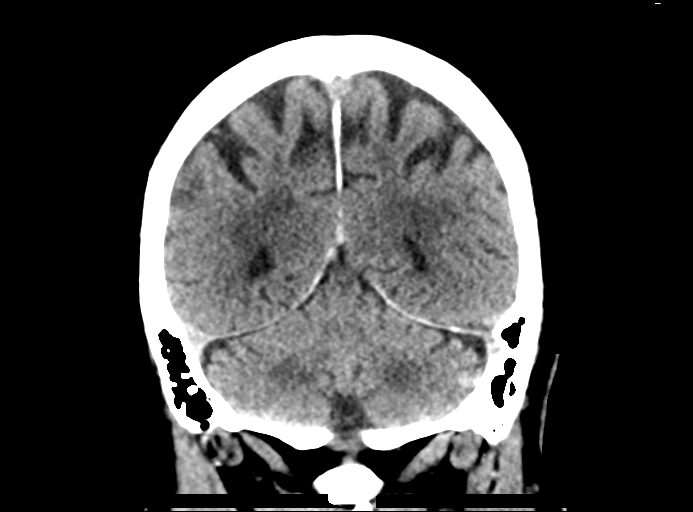
[im 30/67  brain]
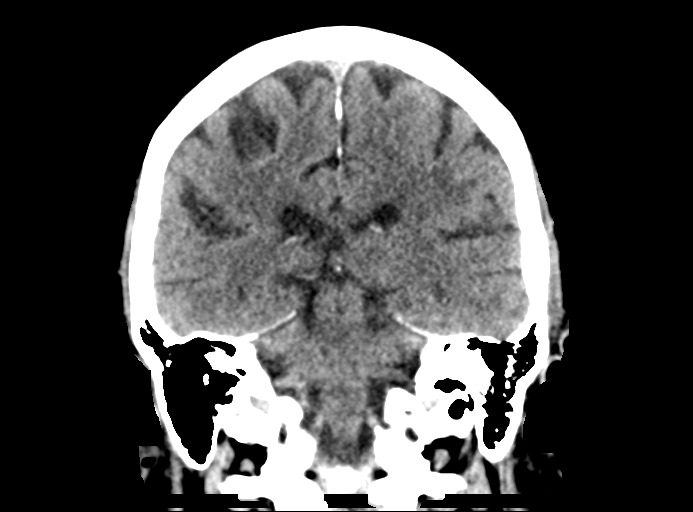
[im 37/67  brain]
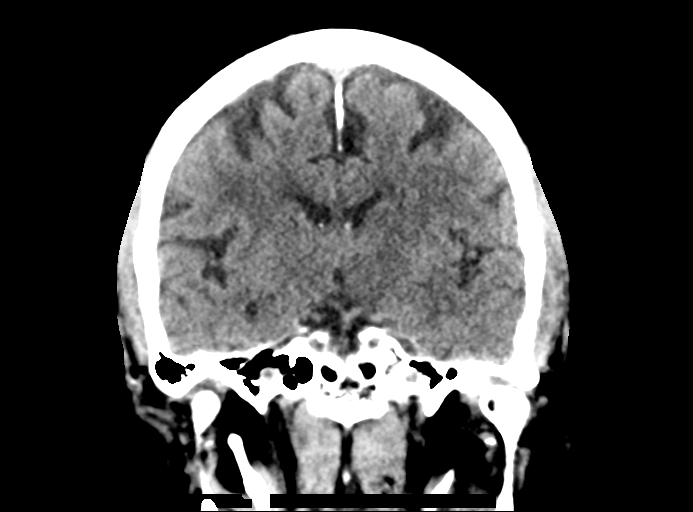

[Series 6: head without sag · sagittal · non-contrast · 0.33mm/px · 3 of 65 slices shown]
[im 22/65  brain]
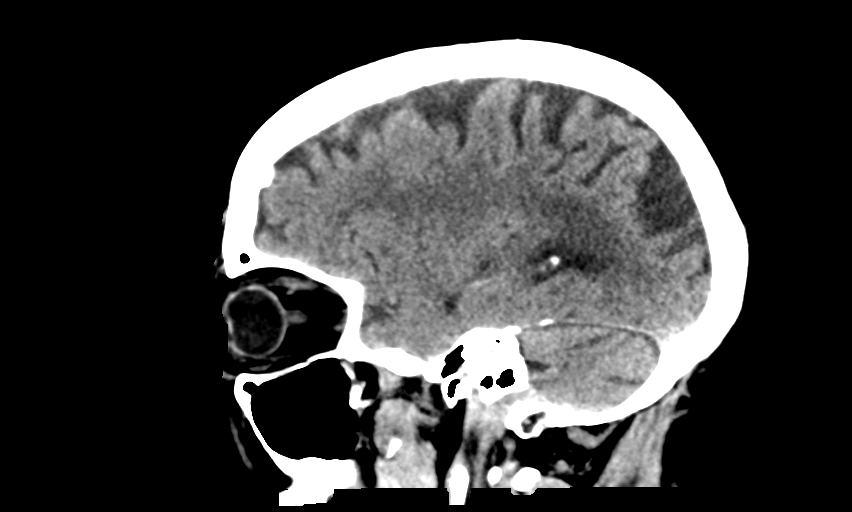
[im 33/65  brain]
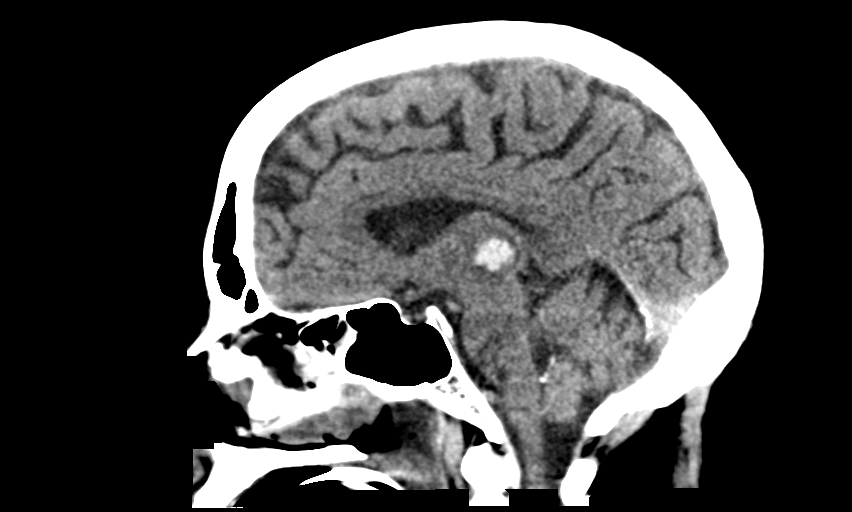
[im 43/65  brain]
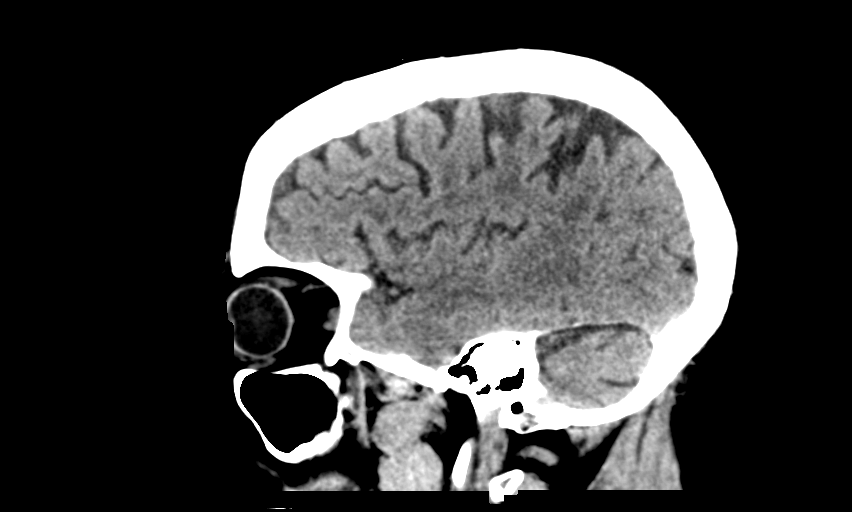

[15 of 47 positions shown; findings below may reference images not displayed]

FINDINGS: Brain: Acute hemorrhage left thalamus unchanged in size. This
measures approximately 13 x 25 mm on axial images. No
intraventricular hemorrhage.

Generalized atrophy. Moderate white matter changes consistent with
chronic microvascular ischemia. No acute ischemic infarct. Right
frontal dural base calcification unchanged likely meningioma
measuring approximately 8 mm

Vascular: Negative for hyperdense vessel

Skull: Negative

Sinuses/Orbits: Mild mucosal edema maxillary sinus bilaterally.
Bilateral cataract extraction

Other: None
IMPRESSION: Left thalamic hemorrhage unchanged from earlier today. No new
hemorrhage or hydrocephalus.

Moderate chronic microvascular ischemic change in the white matter.

## 2022-07-25 ENCOUNTER — Encounter
Payer: Medicare Other | Attending: Physical Medicine and Rehabilitation | Admitting: Physical Medicine and Rehabilitation

## 2022-07-25 VITALS — BP 177/77 | HR 63

## 2022-07-25 DIAGNOSIS — W19XXXS Unspecified fall, sequela: Secondary | ICD-10-CM | POA: Insufficient documentation

## 2022-07-25 DIAGNOSIS — Z794 Long term (current) use of insulin: Secondary | ICD-10-CM | POA: Insufficient documentation

## 2022-07-25 DIAGNOSIS — M1711 Unilateral primary osteoarthritis, right knee: Secondary | ICD-10-CM | POA: Insufficient documentation

## 2022-07-25 DIAGNOSIS — R29898 Other symptoms and signs involving the musculoskeletal system: Secondary | ICD-10-CM | POA: Diagnosis not present

## 2022-07-25 DIAGNOSIS — I10 Essential (primary) hypertension: Secondary | ICD-10-CM | POA: Diagnosis not present

## 2022-07-25 DIAGNOSIS — K5900 Constipation, unspecified: Secondary | ICD-10-CM | POA: Insufficient documentation

## 2022-07-25 DIAGNOSIS — E1165 Type 2 diabetes mellitus with hyperglycemia: Secondary | ICD-10-CM | POA: Diagnosis present

## 2022-07-25 DIAGNOSIS — R4701 Aphasia: Secondary | ICD-10-CM | POA: Insufficient documentation

## 2022-07-25 NOTE — Progress Notes (Signed)
Subjective:    Patient ID: [name] [name], female    DOB: 1932-05-07, 89 y.o.   MRN: [id number]  HPI: [name] [name] is a 89 y.o. female who is here for HFU appointment regarding her Nontraumatic acute hemorrhage of basal ganglia, Essential Hypertension, Primary Osteoarthritis of Right Knee and  Controlled Type 2 Diabetes Mellitus with hyperglycemia with long-term current insulin use. [name] was brought to [facility] via EMS on 01/04/2020 after she collapsed in church during service, while standing. Neurology consulted.  CT Head WO Contrast:  IMPRESSION: 1.3 x 2.7 x 1.5 cm acute parenchymal hemorrhage centered within the left thalamus and basal ganglia. Local mass effect with partial effacement of the third ventricle and 2 mm rightward midline shift at the level of the septum pellucidum.   Mild cerebral atrophy with moderate chronic small vessel ischemic disease.   Known chronic pontine lacunar infarct.   7 mm focus of lobular dural-based calcification versus incidental small calcified meningioma overlying the anterior right frontal lobe.   IMPRESSION: Left thalamic hemorrhage unchanged from earlier today. No new hemorrhage or hydrocephalus.   Moderate chronic microvascular ischemic change in the white matter.  MR Head WO Contrast:  IMPRESSION: 1. No visible mass or vascular lesion underlying the left thalamic hematoma. 2. Extensive chronic small vessel ischemia. 3. Intracranial atherosclerosis.  [name] was admitted to inpatient Rehabilitation on 01/16/2020 and discharged to [facility] on 02/12/2020. She is receiving outpatient therapy with a contracted agency at [facility] per social work discharge summary [name] [name], [name] [name].  She states she has pain in her right eye, no drainage noted. She will F/U with her PCP.  Also reports right knee pain requesting cortisone injection, she will be scheduled with Dr Allena Katz, she verbalizes understanding.  She rated her pain on The Health and History 0. Also reports she has a good appetite.    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  She was in sales  The Arletta Bale has benefitted her greatly initially but the last injection did not provide any relief She has also been having more swelling in the right knee She was able to do her daily functions -COVID really slowed her prior progress -she has been having the EMLA cream -BP elevated today and patient says it usually is 140 systolic Voltaren gel has already been helping.  She has a BM one every other day.   1) Fall Feeling wonderful, but fell once when she was transferring to go to the bathroom  2) Right leg heaviness: -leg has felt heavy since last steroid injection -no longer walking  Pain Inventory Average Pain 7 Pain Right Now 0 My pain is aching  In the last 24 hours, has pain interfered with the following? General activity 0 Relation with others 0 Enjoyment of life 0 What TIME of day is your pain at its worst? varies Sleep (in general) Good  Pain is worse with: walking and standing Pain improves with: rest and injections Relief from Meds:  na  No family history on file. Social History   Socioeconomic History   Marital status: Widowed    Spouse name: Not on file   Number of children: Not on file   Years of education: Not on file   Highest education level: Not on file  Occupational History   Not on file  Tobacco Use   Smoking status: Never  Smokeless tobacco: Never  Vaping Use   Vaping Use: Never used  Substance and Sexual Activity   Alcohol use: Never   Drug use: Never   Sexual activity: Not on file  Other Topics Concern   Not on file  Social History Narrative   Not on file   Social Determinants of Health   Financial Resource Strain: Not on file  Food Insecurity: Not on file  Transportation Needs: Not on file  Physical  Activity: Not on file  Stress: Not on file  Social Connections: Not on file   Past Surgical History:  Procedure Laterality Date   CHOLECYSTECTOMY     COLONOSCOPY     ERCP     PARTIAL HYSTERECTOMY  89years old   POLYPECTOMY     UPPER GASTROINTESTINAL ENDOSCOPY     WRIST FRACTURE SURGERY  left arm,89years old   Past Surgical History:  Procedure Laterality Date   CHOLECYSTECTOMY     COLONOSCOPY     ERCP     PARTIAL HYSTERECTOMY  89years old   POLYPECTOMY     UPPER GASTROINTESTINAL ENDOSCOPY     WRIST FRACTURE SURGERY  left arm,89years old   Past Medical History:  Diagnosis Date   Colon polyp    Diabetes mellitus 1992   Hypertension 1996   Pancreatitis    BP (!) 177/77   Pulse 63   SpO2 97%   Opioid Risk Score:   Fall Risk Score:  `1  Depression screen PHQ 2/9     03/27/2022    1:50 PM 01/16/2022    1:23 PM 10/13/2021   11:33 AM 07/12/2021   11:43 AM 04/05/2021   12:56 PM 01/13/2021   11:28 AM 10/28/2020   11:00 AM  Depression screen PHQ 2/9  Decreased Interest 0 0 0 0 0 0 0  Down, Depressed, Hopeless 0 0 0 0 0 0 0  PHQ - 2 Score 0 0 0 0 0 0 0          No family history on file. Social History   Socioeconomic History   Marital status: Widowed    Spouse name: Not on file   Number of children: Not on file   Years of education: Not on file   Highest education level: Not on file  Occupational History   Not on file  Tobacco Use   Smoking status: Never   Smokeless tobacco: Never  Vaping Use   Vaping Use: Never used  Substance and Sexual Activity   Alcohol use: Never   Drug use: Never   Sexual activity: Not on file  Other Topics Concern   Not on file  Social History Narrative   Not on file   Social Determinants of Health   Financial Resource Strain: Not on file  Food Insecurity: Not on file  Transportation Needs: Not on file  Physical Activity: Not on file  Stress: Not on file  Social Connections: Not on file   Past Surgical History:   Procedure Laterality Date   CHOLECYSTECTOMY     COLONOSCOPY     ERCP     PARTIAL HYSTERECTOMY  89years old   POLYPECTOMY     UPPER GASTROINTESTINAL ENDOSCOPY     WRIST FRACTURE SURGERY  left arm,89years old   Past Medical History:  Diagnosis Date   Colon polyp    Diabetes mellitus 1992   Hypertension 1996   Pancreatitis    There were no vitals taken for this visit.  Opioid Risk Score:  Fall Risk Score:  `1  Depression screen PHQ 2/9     03/27/2022    1:50 PM 01/16/2022    1:23 PM 10/13/2021   11:33 AM 07/12/2021   11:43 AM 04/05/2021   12:56 PM 01/13/2021   11:28 AM 10/28/2020   11:00 AM  Depression screen PHQ 2/9  Decreased Interest 0 0 0 0 0 0 0  Down, Depressed, Hopeless 0 0 0 0 0 0 0  PHQ - 2 Score 0 0 0 0 0 0 0    Review of Systems  Constitutional: Negative.   HENT: Negative.    Eyes: Negative.   Respiratory: Negative.    Cardiovascular: Negative.        Limb swelling , right arm  Gastrointestinal: Negative.   Endocrine: Negative.        High blood sugar  Genitourinary: Negative.   Musculoskeletal:  Positive for back pain and gait problem.       Right knee pain  Allergic/Immunologic: Negative.   Neurological:  Positive for weakness and numbness.  Hematological: Negative.   Psychiatric/Behavioral: Negative.    All other systems reviewed and are negative.      Objective:   Physical Exam Vitals and nursing note reviewed. BP 177/71 Constitutional:      Appearance: Normal appearance.  Cardiovascular:     Rate and Rhythm: Normal rate and regular rhythm.     Pulses: Normal pulses.     Heart sounds: Normal heart sounds.  Pulmonary:     Effort: Pulmonary effort is normal.     Breath sounds: Normal breath sounds.  Musculoskeletal:     Cervical back: Normal range of motion and neck supple.     Comments: Normal Muscle Bulk and Muscle Testing Reveals:  Upper Extremities: Full ROM and Muscle Strength 5/5 Lower Extremities: Full ROM and Muscle Strength  5/5 Arrived in wheelchair  Right knee TTP and swollen Skin:    General: Skin is warm and dry.  Neurological:     Mental Status: She is alert and oriented to person, place, and time.  Psychiatric:        Mood and Affect: Mood normal.        Behavior: Behavior normal.         Assessment & Plan:  1.Nontraumatic acute hemorrhage of basal ganglia: Continue Outpatient Therapy.  She has a scheduled appointment with Neurology.   2.Essential Hypertension: Continue current medication regimen. PCP following. Continue to monitor.  -BP is 177/77 today.  -discussed semglutide, ozempic -discussed that in facility BP is usually much better controlled -Advised checking BP daily at home and logging results to bring into follow-up appointment with PCP and myself. -Reviewed BP meds today.  -Advised regarding healthy foods that can help lower blood pressure and provided with a list: 1) citrus foods- high in vitamins and minerals 2) salmon and other fatty fish - reduces inflammation and oxylipins 3) swiss chard (leafy green)- high level of nitrates 4) pumpkin seeds- one of the best natural sources of magnesium 5) Beans and lentils- high in fiber, magnesium, and potassium 6) Berries- high in flavonoids 7) Amaranth (whole grain, can be cooked similarly to rice and oats)- high in magnesium and fiber 8) Pistachios- even more effective at reducing BP than other nuts 9) Carrots- high in phenolic compounds that relax blood vessels and reduce inflammation 10) Celery- contain phthalides that relax tissues of arterial walls 11) Tomatoes- can also improve cholesterol and reduce risk of heart disease 12) Broccoli- good source of  magnesium, calcium, and potassium 13) Greek yogurt: high in potassium and calcium 14) Herbs and spices: Celery seed, cilantro, saffron, lemongrass, black cumin, ginseng, cinnamon, cardamom, sweet basil, and ginger 15) Chia and flax seeds- also help to lower cholesterol and blood  sugar 16) Beets- high levels of nitrates that relax blood vessels  17) spinach and bananas- high in potassium  -Provided lise of supplements that can help with hypertension:  1) magnesium: one high quality brand is Bioptemizers since it contains all 7 types of magnesium, otherwise over the counter magnesium gluconate 400mg  is a good option 2) B vitamins 3) vitamin D 4) potassium 5) CoQ10 6) L-arginine 7) Vitamin C 8) Beetroot -Educated that goal BP is 120/80. -Made goal to incorporate some of the above foods into diet.     3.Controlled Type 2 Diabetes Mellitus with hyperglycemia with long-term current insulin use. PCP following.   5. Aphasia: recommended continuing speech, educated that progress can continue in the first 2 years 6. Impaired mobility: start taking daily walks outside.  7. Constipation:  -Provided list of following foods that help with constipation and highlighted a few: 1) prunes- contain high amounts of fiber.  2) apples- has a form of dietary fiber called pectin that accelerates stool movement and increases beneficial gut bacteria 3) pears- in addition to fiber, also high in fructose and sorbitol which have laxative effect 4) figs- contain an enzyme ficin which helps to speed colonic transit 5) kiwis- contain an enzyme actinidin that improves gut motility and reduces constipation 6) oranges- rich in pectin (like apples) 7) grapefruits- contain a flavanol naringenin which has a laxative effect 8) vegetables- rich in fiber and also great sources of folate, vitamin C, and K 9) artichoke- high in inulin, prebiotic great for the microbiome 10) chicory- increases stool frequency and softness (can be added to coffee) 11) rhubarb- laxative effect 12) sweet potato- high fiber 13) beans, peas, and lentils- contain both soluble and insoluble fiber 14) chia seeds- improves intestinal health and gut flora 15) flaxseeds- laxative effect 16) whole grain rye bread- high in  fiber 17) oat bran- high in soluble and insoluble fiber 18) kefir- softens stools -recommended to try at least one of these foods every day.  -drink 6-8 glasses of water per day -walk regularly, especially after meals.   4) Right knee pain: -discussed trying next Zilretta injection under fluoroscopic guidance with Dr. Benjie Karvonen  -Discussed Zynex heating/cooling blanket and Nexwave but patient defers -prescribed red light therapy -discussed that pain has improved -continue lidocaine patches -discussed semglutide  5) Hypoglycemic episodes: -discussed that she does not feel very right when she drops low

## 2022-07-25 NOTE — Patient Instructions (Addendum)
Red light therapy  Semaglutide (Wegovy, Ozempic)

## 2022-09-08 ENCOUNTER — Other Ambulatory Visit: Payer: Self-pay

## 2022-09-08 ENCOUNTER — Emergency Department (HOSPITAL_COMMUNITY): Payer: Medicare Other

## 2022-09-08 ENCOUNTER — Inpatient Hospital Stay (HOSPITAL_COMMUNITY)
Admission: EM | Admit: 2022-09-08 | Discharge: 2022-09-22 | DRG: 264 | Disposition: A | Discharge status: Skilled Nursing Facility | Payer: Medicare Other | Source: Skilled Nursing Facility | Attending: Internal Medicine | Admitting: Internal Medicine

## 2022-09-08 ENCOUNTER — Encounter (HOSPITAL_COMMUNITY): Payer: Self-pay

## 2022-09-08 DIAGNOSIS — R0682 Tachypnea, not elsewhere classified: Secondary | ICD-10-CM | POA: Diagnosis present

## 2022-09-08 DIAGNOSIS — Z7984 Long term (current) use of oral hypoglycemic drugs: Secondary | ICD-10-CM

## 2022-09-08 DIAGNOSIS — N179 Acute kidney failure, unspecified: Principal | ICD-10-CM

## 2022-09-08 DIAGNOSIS — G929 Unspecified toxic encephalopathy: Secondary | ICD-10-CM | POA: Diagnosis not present

## 2022-09-08 DIAGNOSIS — R7401 Elevation of levels of liver transaminase levels: Secondary | ICD-10-CM | POA: Diagnosis present

## 2022-09-08 DIAGNOSIS — Z515 Encounter for palliative care: Secondary | ICD-10-CM

## 2022-09-08 DIAGNOSIS — U071 COVID-19: Secondary | ICD-10-CM | POA: Diagnosis present

## 2022-09-08 DIAGNOSIS — Z86010 Personal history of colonic polyps: Secondary | ICD-10-CM

## 2022-09-08 DIAGNOSIS — E1122 Type 2 diabetes mellitus with diabetic chronic kidney disease: Secondary | ICD-10-CM | POA: Diagnosis present

## 2022-09-08 DIAGNOSIS — E11649 Type 2 diabetes mellitus with hypoglycemia without coma: Secondary | ICD-10-CM | POA: Diagnosis not present

## 2022-09-08 DIAGNOSIS — L0390 Cellulitis, unspecified: Secondary | ICD-10-CM | POA: Diagnosis present

## 2022-09-08 DIAGNOSIS — G9341 Metabolic encephalopathy: Secondary | ICD-10-CM | POA: Diagnosis not present

## 2022-09-08 DIAGNOSIS — R410 Disorientation, unspecified: Secondary | ICD-10-CM | POA: Diagnosis not present

## 2022-09-08 DIAGNOSIS — B9562 Methicillin resistant Staphylococcus aureus infection as the cause of diseases classified elsewhere: Secondary | ICD-10-CM | POA: Diagnosis present

## 2022-09-08 DIAGNOSIS — E1165 Type 2 diabetes mellitus with hyperglycemia: Secondary | ICD-10-CM | POA: Diagnosis present

## 2022-09-08 DIAGNOSIS — L89150 Pressure ulcer of sacral region, unstageable: Secondary | ICD-10-CM | POA: Diagnosis present

## 2022-09-08 DIAGNOSIS — R7881 Bacteremia: Secondary | ICD-10-CM | POA: Diagnosis not present

## 2022-09-08 DIAGNOSIS — E1152 Type 2 diabetes mellitus with diabetic peripheral angiopathy with gangrene: Secondary | ICD-10-CM | POA: Diagnosis present

## 2022-09-08 DIAGNOSIS — R1311 Dysphagia, oral phase: Secondary | ICD-10-CM | POA: Diagnosis not present

## 2022-09-08 DIAGNOSIS — E871 Hypo-osmolality and hyponatremia: Secondary | ICD-10-CM | POA: Diagnosis not present

## 2022-09-08 DIAGNOSIS — R627 Adult failure to thrive: Secondary | ICD-10-CM | POA: Diagnosis present

## 2022-09-08 DIAGNOSIS — D631 Anemia in chronic kidney disease: Secondary | ICD-10-CM | POA: Diagnosis present

## 2022-09-08 DIAGNOSIS — G928 Other toxic encephalopathy: Secondary | ICD-10-CM | POA: Diagnosis not present

## 2022-09-08 DIAGNOSIS — E8721 Acute metabolic acidosis: Secondary | ICD-10-CM | POA: Diagnosis not present

## 2022-09-08 DIAGNOSIS — N170 Acute kidney failure with tubular necrosis: Secondary | ICD-10-CM | POA: Diagnosis present

## 2022-09-08 DIAGNOSIS — Z66 Do not resuscitate: Secondary | ICD-10-CM | POA: Diagnosis not present

## 2022-09-08 DIAGNOSIS — L03312 Cellulitis of back [any part except buttock]: Secondary | ICD-10-CM | POA: Diagnosis present

## 2022-09-08 DIAGNOSIS — Z961 Presence of intraocular lens: Secondary | ICD-10-CM | POA: Diagnosis present

## 2022-09-08 DIAGNOSIS — Z90711 Acquired absence of uterus with remaining cervical stump: Secondary | ICD-10-CM

## 2022-09-08 DIAGNOSIS — Z885 Allergy status to narcotic agent status: Secondary | ICD-10-CM

## 2022-09-08 DIAGNOSIS — I130 Hypertensive heart and chronic kidney disease with heart failure and stage 1 through stage 4 chronic kidney disease, or unspecified chronic kidney disease: Secondary | ICD-10-CM | POA: Diagnosis present

## 2022-09-08 DIAGNOSIS — L89139 Pressure ulcer of right lower back, unspecified stage: Secondary | ICD-10-CM | POA: Diagnosis not present

## 2022-09-08 DIAGNOSIS — F05 Delirium due to known physiological condition: Secondary | ICD-10-CM | POA: Diagnosis not present

## 2022-09-08 DIAGNOSIS — E870 Hyperosmolality and hypernatremia: Secondary | ICD-10-CM | POA: Diagnosis present

## 2022-09-08 DIAGNOSIS — E8809 Other disorders of plasma-protein metabolism, not elsewhere classified: Secondary | ICD-10-CM | POA: Diagnosis not present

## 2022-09-08 DIAGNOSIS — B370 Candidal stomatitis: Secondary | ICD-10-CM | POA: Diagnosis not present

## 2022-09-08 DIAGNOSIS — K5900 Constipation, unspecified: Secondary | ICD-10-CM | POA: Diagnosis present

## 2022-09-08 DIAGNOSIS — E113393 Type 2 diabetes mellitus with moderate nonproliferative diabetic retinopathy without macular edema, bilateral: Secondary | ICD-10-CM | POA: Diagnosis present

## 2022-09-08 DIAGNOSIS — E785 Hyperlipidemia, unspecified: Secondary | ICD-10-CM | POA: Diagnosis present

## 2022-09-08 DIAGNOSIS — N183 Chronic kidney disease, stage 3 unspecified: Secondary | ICD-10-CM | POA: Diagnosis present

## 2022-09-08 DIAGNOSIS — R1310 Dysphagia, unspecified: Secondary | ICD-10-CM | POA: Diagnosis not present

## 2022-09-08 DIAGNOSIS — N1832 Chronic kidney disease, stage 3b: Secondary | ICD-10-CM | POA: Diagnosis present

## 2022-09-08 DIAGNOSIS — L03317 Cellulitis of buttock: Secondary | ICD-10-CM | POA: Diagnosis present

## 2022-09-08 DIAGNOSIS — Z7189 Other specified counseling: Secondary | ICD-10-CM | POA: Diagnosis not present

## 2022-09-08 DIAGNOSIS — L89313 Pressure ulcer of right buttock, stage 3: Secondary | ICD-10-CM | POA: Diagnosis present

## 2022-09-08 DIAGNOSIS — Z9049 Acquired absence of other specified parts of digestive tract: Secondary | ICD-10-CM

## 2022-09-08 DIAGNOSIS — Z888 Allergy status to other drugs, medicaments and biological substances status: Secondary | ICD-10-CM

## 2022-09-08 DIAGNOSIS — I96 Gangrene, not elsewhere classified: Secondary | ICD-10-CM | POA: Diagnosis present

## 2022-09-08 DIAGNOSIS — Z6836 Body mass index (BMI) 36.0-36.9, adult: Secondary | ICD-10-CM

## 2022-09-08 DIAGNOSIS — Z789 Other specified health status: Secondary | ICD-10-CM

## 2022-09-08 DIAGNOSIS — N1831 Chronic kidney disease, stage 3a: Secondary | ICD-10-CM | POA: Diagnosis not present

## 2022-09-08 DIAGNOSIS — B957 Other staphylococcus as the cause of diseases classified elsewhere: Secondary | ICD-10-CM | POA: Diagnosis present

## 2022-09-08 DIAGNOSIS — Z794 Long term (current) use of insulin: Secondary | ICD-10-CM

## 2022-09-08 DIAGNOSIS — L0231 Cutaneous abscess of buttock: Secondary | ICD-10-CM | POA: Diagnosis present

## 2022-09-08 DIAGNOSIS — T361X5A Adverse effect of cephalosporins and other beta-lactam antibiotics, initial encounter: Secondary | ICD-10-CM | POA: Diagnosis not present

## 2022-09-08 DIAGNOSIS — Z79899 Other long term (current) drug therapy: Secondary | ICD-10-CM

## 2022-09-08 DIAGNOSIS — R531 Weakness: Secondary | ICD-10-CM | POA: Diagnosis not present

## 2022-09-08 DIAGNOSIS — Z7982 Long term (current) use of aspirin: Secondary | ICD-10-CM

## 2022-09-08 DIAGNOSIS — I5032 Chronic diastolic (congestive) heart failure: Secondary | ICD-10-CM | POA: Diagnosis present

## 2022-09-08 DIAGNOSIS — M1711 Unilateral primary osteoarthritis, right knee: Secondary | ICD-10-CM | POA: Diagnosis present

## 2022-09-08 DIAGNOSIS — I69351 Hemiplegia and hemiparesis following cerebral infarction affecting right dominant side: Secondary | ICD-10-CM | POA: Diagnosis not present

## 2022-09-08 LAB — CBC WITH DIFFERENTIAL/PLATELET
Abs Immature Granulocytes: 0.14 10*3/uL — ABNORMAL HIGH (ref 0.00–0.07)
Basophils Absolute: 0 10*3/uL (ref 0.0–0.1)
Basophils Relative: 0 %
Eosinophils Absolute: 0 10*3/uL (ref 0.0–0.5)
Eosinophils Relative: 0 %
HCT: 33.3 % — ABNORMAL LOW (ref 36.0–46.0)
Hemoglobin: 10.5 g/dL — ABNORMAL LOW (ref 12.0–15.0)
Immature Granulocytes: 1 %
Lymphocytes Relative: 5 %
Lymphs Abs: 0.8 10*3/uL (ref 0.7–4.0)
MCH: 25.9 pg — ABNORMAL LOW (ref 26.0–34.0)
MCHC: 31.5 g/dL (ref 30.0–36.0)
MCV: 82.2 fL (ref 80.0–100.0)
Monocytes Absolute: 1.9 10*3/uL — ABNORMAL HIGH (ref 0.1–1.0)
Monocytes Relative: 10 %
Neutro Abs: 15.6 10*3/uL — ABNORMAL HIGH (ref 1.7–7.7)
Neutrophils Relative %: 84 %
Platelets: 240 10*3/uL (ref 150–400)
RBC: 4.05 MIL/uL (ref 3.87–5.11)
RDW: 15.4 % (ref 11.5–15.5)
WBC: 18.5 10*3/uL — ABNORMAL HIGH (ref 4.0–10.5)
nRBC: 0 % (ref 0.0–0.2)

## 2022-09-08 LAB — COMPREHENSIVE METABOLIC PANEL
ALT: 26 U/L (ref 0–44)
AST: 53 U/L — ABNORMAL HIGH (ref 15–41)
Albumin: 2.8 g/dL — ABNORMAL LOW (ref 3.5–5.0)
Alkaline Phosphatase: 73 U/L (ref 38–126)
Anion gap: 14 (ref 5–15)
BUN: 51 mg/dL — ABNORMAL HIGH (ref 8–23)
CO2: 21 mmol/L — ABNORMAL LOW (ref 22–32)
Calcium: 7.4 mg/dL — ABNORMAL LOW (ref 8.9–10.3)
Chloride: 95 mmol/L — ABNORMAL LOW (ref 98–111)
Creatinine, Ser: 3.24 mg/dL — ABNORMAL HIGH (ref 0.44–1.00)
GFR, Estimated: 13 mL/min — ABNORMAL LOW (ref >60)
Glucose, Bld: 176 mg/dL — ABNORMAL HIGH (ref 70–99)
Potassium: 4.4 mmol/L (ref 3.5–5.1)
Sodium: 130 mmol/L — ABNORMAL LOW (ref 135–145)
Total Bilirubin: 0.8 mg/dL (ref 0.3–1.2)
Total Protein: 6.8 g/dL (ref 6.5–8.1)

## 2022-09-08 LAB — URINALYSIS, ROUTINE W REFLEX MICROSCOPIC
Bilirubin Urine: NEGATIVE
Glucose, UA: NEGATIVE mg/dL
Hgb urine dipstick: NEGATIVE
Ketones, ur: NEGATIVE mg/dL
Leukocytes,Ua: NEGATIVE
Nitrite: NEGATIVE
Protein, ur: 100 mg/dL — AB
Specific Gravity, Urine: 1.017 (ref 1.005–1.030)
pH: 5 (ref 5.0–8.0)

## 2022-09-08 LAB — BASIC METABOLIC PANEL
Anion gap: 13 (ref 5–15)
BUN: 50 mg/dL — ABNORMAL HIGH (ref 8–23)
CO2: 22 mmol/L (ref 22–32)
Calcium: 7.4 mg/dL — ABNORMAL LOW (ref 8.9–10.3)
Chloride: 97 mmol/L — ABNORMAL LOW (ref 98–111)
Creatinine, Ser: 2.9 mg/dL — ABNORMAL HIGH (ref 0.44–1.00)
GFR, Estimated: 15 mL/min — ABNORMAL LOW (ref >60)
Glucose, Bld: 206 mg/dL — ABNORMAL HIGH (ref 70–99)
Potassium: 4.3 mmol/L (ref 3.5–5.1)
Sodium: 132 mmol/L — ABNORMAL LOW (ref 135–145)

## 2022-09-08 LAB — CBG MONITORING, ED
Glucose-Capillary: 154 mg/dL — ABNORMAL HIGH (ref 70–99)
Glucose-Capillary: 194 mg/dL — ABNORMAL HIGH (ref 70–99)

## 2022-09-08 LAB — GLUCOSE, CAPILLARY: Glucose-Capillary: 245 mg/dL — ABNORMAL HIGH (ref 70–99)

## 2022-09-08 LAB — LACTIC ACID, PLASMA: Lactic Acid, Venous: 1.8 mmol/L (ref 0.5–1.9)

## 2022-09-08 MED ORDER — INSULIN ASPART 100 UNIT/ML IJ SOLN
0.0000 [IU] | Freq: Three times a day (TID) | INTRAMUSCULAR | Status: DC
  Administered 2022-09-08: 2 [IU] via SUBCUTANEOUS
  Administered 2022-09-09: 3 [IU] via SUBCUTANEOUS
  Administered 2022-09-09: 7 [IU] via SUBCUTANEOUS
  Filled 2022-09-08: qty 0.09

## 2022-09-08 MED ORDER — SODIUM CHLORIDE 0.9 % IV BOLUS
500.0000 mL | Freq: Once | INTRAVENOUS | Status: AC
  Administered 2022-09-08: 500 mL via INTRAVENOUS

## 2022-09-08 MED ORDER — ONDANSETRON HCL 4 MG PO TABS: 4.0000 mg | ORAL_TABLET | Freq: Four times a day (QID) | ORAL | Status: DC | PRN

## 2022-09-08 MED ORDER — METOPROLOL TARTRATE 5 MG/5ML IV SOLN
5.0000 mg | Freq: Three times a day (TID) | INTRAVENOUS | Status: DC | PRN
  Administered 2022-09-14: 5 mg via INTRAVENOUS
  Filled 2022-09-08: qty 5

## 2022-09-08 MED ORDER — METOPROLOL SUCCINATE ER 50 MG PO TB24: 200.0000 mg | ORAL_TABLET | Freq: Every day | ORAL | Status: DC

## 2022-09-08 MED ORDER — ENOXAPARIN SODIUM 30 MG/0.3ML IJ SOSY: 30.0000 mg | PREFILLED_SYRINGE | INTRAMUSCULAR | Status: DC

## 2022-09-08 MED ORDER — PIPERACILLIN-TAZOBACTAM 3.375 G IVPB 30 MIN
3.3750 g | Freq: Once | INTRAVENOUS | Status: AC
  Administered 2022-09-08: 3.375 g via INTRAVENOUS
  Filled 2022-09-08: qty 50

## 2022-09-08 MED ORDER — ALBUTEROL SULFATE (2.5 MG/3ML) 0.083% IN NEBU
3.0000 mL | INHALATION_SOLUTION | RESPIRATORY_TRACT | Status: DC | PRN
  Administered 2022-09-09: 3 mL via RESPIRATORY_TRACT
  Filled 2022-09-08: qty 3

## 2022-09-08 MED ORDER — HYDRALAZINE HCL 50 MG PO TABS
100.0000 mg | ORAL_TABLET | Freq: Three times a day (TID) | ORAL | Status: DC
  Administered 2022-09-08 – 2022-09-13 (×13): 100 mg via ORAL
  Filled 2022-09-08 (×15): qty 2

## 2022-09-08 MED ORDER — INSULIN ASPART 100 UNIT/ML IJ SOLN
3.0000 [IU] | Freq: Three times a day (TID) | INTRAMUSCULAR | Status: DC
  Administered 2022-09-09 (×2): 3 [IU] via SUBCUTANEOUS
  Filled 2022-09-08: qty 0.03

## 2022-09-08 MED ORDER — VANCOMYCIN HCL 2000 MG/400ML IV SOLN
2000.0000 mg | Freq: Once | INTRAVENOUS | Status: AC
  Administered 2022-09-09: 2000 mg via INTRAVENOUS
  Filled 2022-09-08: qty 400

## 2022-09-08 MED ORDER — HYDROCODONE-ACETAMINOPHEN 5-325 MG PO TABS
1.0000 | ORAL_TABLET | ORAL | Status: DC | PRN
  Administered 2022-09-13 (×2): 1 via ORAL
  Filled 2022-09-08 (×2): qty 1

## 2022-09-08 MED ORDER — METOPROLOL TARTRATE 50 MG PO TABS
100.0000 mg | ORAL_TABLET | Freq: Two times a day (BID) | ORAL | Status: DC
  Administered 2022-09-08 – 2022-09-12 (×8): 100 mg via ORAL
  Filled 2022-09-08 (×10): qty 2

## 2022-09-08 MED ORDER — ORAL CARE MOUTH RINSE: 15.0000 mL | OROMUCOSAL | Status: DC | PRN

## 2022-09-08 MED ORDER — ONDANSETRON HCL 4 MG/2ML IJ SOLN: 4.0000 mg | Freq: Four times a day (QID) | INTRAMUSCULAR | Status: DC | PRN

## 2022-09-08 MED ORDER — SODIUM CHLORIDE 0.9 % IV SOLN: INTRAVENOUS | Status: DC

## 2022-09-08 MED ORDER — POLYETHYLENE GLYCOL 3350 17 G PO PACK
17.0000 g | PACK | Freq: Every day | ORAL | Status: DC | PRN
  Administered 2022-09-13: 17 g via ORAL
  Filled 2022-09-08 (×4): qty 1

## 2022-09-08 MED ORDER — INSULIN ASPART 100 UNIT/ML IJ SOLN
0.0000 [IU] | Freq: Every day | INTRAMUSCULAR | Status: DC
  Administered 2022-09-08: 2 [IU] via SUBCUTANEOUS
  Administered 2022-09-10: 4 [IU] via SUBCUTANEOUS
  Administered 2022-09-10: 3 [IU] via SUBCUTANEOUS
  Administered 2022-09-11: 2 [IU] via SUBCUTANEOUS
  Administered 2022-09-12: 3 [IU] via SUBCUTANEOUS
  Administered 2022-09-15: 2 [IU] via SUBCUTANEOUS
  Filled 2022-09-08: qty 0.05

## 2022-09-08 NOTE — ED Provider Notes (Signed)
[place] [facility]ERGENCY DEPARTMENT AT [facility] Provider Note   CSN: [id number] Arrival date & time: 09/08/22  1208     History  Chief Complaint  Patient presents with   Weakness   Back Pain    Jonasia C Stachowski is a 89 y.o. female.  Pt is a 89 yo female with pmhx significant for dm, htn, and pancreatitis.  Pt is at ALF ([facility]) and was diagnosed with Covid on 7/10 (sx started on 7/7).  She was started on Paxlovid on the 10th. Pt has had a cough and has been very weak.  Pt has severe low back pain.  She was diagnosed with cellulitis on the 10th also and was put on Bactrim.         Home Medications Prior to Admission medications   Medication Sig Start Date End Date Taking? Authorizing Provider  ACCU-CHEK GUIDE test strip  11/08/20   [provider]  acetaminophen (TYLENOL) 500 MG tablet Take by mouth.    [provider]  albuterol (VENTOLIN HFA) 108 (90 Base) MCG/ACT inhaler 1 puff as needed    [provider]  amantadine (SYMMETREL) 100 MG capsule Take 1 capsule (100 mg total) by mouth daily. 02/12/20   Love, Evlyn Kanner, PA-C  ASPERCREME/ALOE 10 % cream Apply topically. 09/14/21   [provider]  ASPIRIN 81 PO SMARTSIG:1 Tablet(s) By Mouth Daily 10/07/21   [provider]  atorvastatin (LIPITOR) 40 MG tablet Take 1 tablet (40 mg total) by mouth daily. 04/13/20   Ihor Austin, NP  azelastine (ASTELIN) 0.1 % nasal spray  06/22/20   [provider]  benzonatate (TESSALON) 100 MG capsule Take 1 capsule (100 mg total) by mouth every 8 (eight) hours. 01/08/22   Darrick Grinder, PA-C  Calcium Carbonate-Vit D-Min (CALCIUM 1200 PO)     [provider]  chlorthalidone (HYGROTON) 25 MG tablet Take 1.5 tablets (37.5 mg total) by mouth daily. 02/12/20   Love, Evlyn Kanner, PA-C  chlorthalidone (HYGROTON) 50 MG tablet Take 1 tablet by mouth daily.    [provider]  CLONIDINE HCL PO     [provider]   Continuous Blood Gluc Receiver (DEXCOM G6 RECEIVER) DEVI 1 each by Does not apply route daily. 10/28/20   Raulkar, Drema Pry, MD  Continuous Blood Gluc Sensor (DEXCOM G6 SENSOR) MISC 1 each by Does not apply route daily. 10/28/20   Raulkar, Drema Pry, MD  Continuous Blood Gluc Transmit (DEXCOM G6 TRANSMITTER) MISC 1 each by Does not apply route daily. 10/28/20   Raulkar, Drema Pry, MD  dapagliflozin propanediol (FARXIGA) 5 MG TABS tablet  09/22/21   [provider]  diclofenac Sodium (VOLTAREN ARTHRITIS PAIN) 1 % GEL Apply 2 g topically 4 (four) times daily. 04/24/22   Raulkar, Drema Pry, MD  diclofenac Sodium (VOLTAREN) 1 % GEL Apply topically. 02/12/20   [provider]  DROPSAFE SAFETY PEN NEEDLES 31G X 6 MM MISC  10/20/20   [provider]  furosemide (LASIX) 20 MG tablet Take 0.5 tablets (10 mg total) by mouth See admin instructions. 02/12/20   Love, Evlyn Kanner, PA-C  HUMALOG KWIKPEN 100 UNIT/ML KwikPen Inject into the skin. 11/01/20   [provider]  hydrALAZINE (APRESOLINE) 100 MG tablet Take 1 tablet (100 mg total) by mouth 3 (three) times daily. 02/12/20   Love, Evlyn Kanner, PA-C  insulin glargine (LANTUS) 100 UNIT/ML injection Inject into the skin. 02/12/20   [provider]  insulin isophane &  regular human KwikPen (HUMULIN 70/30 KWIKPEN) (70-30) 100 UNIT/ML KwikPen INJECT 100 UNITS SUBCUTANEOUSLY BEFORE BREAKFAST THEN INJECT 60 TO 80 UNITS BEFORE LUNCH AND THEN 70 UNITS BEFORE EVENING MEAL AS DIRECTED INJECT THREE TIMES DAILY    [provider]  insulin lispro (HUMALOG) 100 UNIT/ML injection Inject into the skin 3 (three) times daily before meals.    [provider]  insulin NPH-regular Human (NOVOLIN 70/30) (70-30) 100 UNIT/ML injection     [provider]  Insulin Syringe-Needle U-100 31G X 5/16" 1 ML MISC 1 application by Does not apply route 2 (two) times daily. 02/12/20   Love, Evlyn Kanner, PA-C  LANTUS SOLOSTAR 100 UNIT/ML  Solostar Pen Inject into the skin. 11/08/20   [provider]  lidocaine (LIDODERM) 5 % APPLY (1) PATCH TO LOWER BACK AND RIGHT KNEE ONCE DAILY. LEAVE ON 12 HOURS. LEAVE OFF 12 HOURS. 03/29/22   Raulkar, Drema Pry, MD  Lidocaine HCl (ASPERCREME LIDOCAINE) 4 % CREA 1 application    [provider]  loratadine (CLARITIN) 10 MG tablet 1 tablet    [provider]  losartan (COZAAR) 100 MG tablet Take 1 tablet by mouth daily.    [provider]  magnesium oxide (MAG-OX) 400 MG tablet 1 tablet as needed    [provider]  metoprolol (TOPROL-XL) 200 MG 24 hr tablet Take 1 tablet by mouth daily.    [provider]  metoprolol tartrate (LOPRESSOR) 100 MG tablet Take 1 tablet (100 mg total) by mouth 2 (two) times daily. 02/12/20   Love, Evlyn Kanner, PA-C  miconazole (BAZA ANTIFUNGAL) 2 % cream Apply topically 2 (two) times daily. 03/27/22   Raulkar, Drema Pry, MD  pantoprazole (PROTONIX) 40 MG tablet Take 1 tablet (40 mg total) by mouth daily. 02/12/20 02/11/21  Love, Evlyn Kanner, PA-C  polyethylene glycol (MIRALAX / GLYCOLAX) 17 g packet Take 17 g by mouth 2 (two) times daily. 02/12/20   Love, Evlyn Kanner, PA-C  Potassium 99 MG TABS     [provider]  potassium chloride (KLOR-CON) 10 MEQ tablet Take 10 mEq by mouth daily. 07/10/20   [provider]  Pravastatin Sodium (PRAVACHOL PO)     [provider]  QUEtiapine (SEROQUEL) 25 MG tablet Take 0.5 tablets (12.5 mg total) by mouth at bedtime as needed (anxiety). 03/23/20   Jones Bales, NP  senna-docusate (SENOKOT-S) 8.6-50 MG tablet Take 1 tablet by mouth 2 (two) times daily. 02/12/20   Love, Evlyn Kanner, PA-C  triamcinolone cream (KENALOG) 0.1 % Apply topically. 10/01/20   [provider]      Allergies    Amlodipine, Ace inhibitors, Atorvastatin, Canagliflozin, Clonidine hcl, Amlodipine besylate, Atorvastatin calcium, Buprenorphine, Morphine sulfate, and Morphine and codeine     Review of Systems   Review of Systems  Respiratory:  Positive for cough and shortness of breath.   Skin:  Positive for wound.  All other systems reviewed and are negative.   Physical Exam Updated Vital Signs BP (!) 162/58   Pulse 87   Temp 98.7 F (37.1 C) (Oral)   Resp 18   Ht 5\' 6"  (1.676 m)   Wt 99.8 kg   SpO2 98%   BMI 35.51 kg/m  Physical Exam Vitals and nursing note reviewed.  Constitutional:      Appearance: Normal appearance. She is obese.  HENT:     Head: Normocephalic and atraumatic.     Right Ear: External ear normal.     Left Ear:  External ear normal.     Nose: Nose normal.     Mouth/Throat:     Mouth: Mucous membranes are moist.     Pharynx: Oropharynx is clear.  Eyes:     Extraocular Movements: Extraocular movements intact.     Conjunctiva/sclera: Conjunctivae normal.     Pupils: Pupils are equal, round, and reactive to light.  Cardiovascular:     Rate and Rhythm: Normal rate and regular rhythm.     Pulses: Normal pulses.     Heart sounds: Normal heart sounds.  Pulmonary:     Effort: Pulmonary effort is normal.     Breath sounds: Normal breath sounds.  Abdominal:     General: Abdomen is flat. Bowel sounds are normal.     Palpations: Abdomen is soft.  Musculoskeletal:        General: Normal range of motion.     Cervical back: Normal range of motion and neck supple.  Skin:    Capillary Refill: Capillary refill takes less than 2 seconds.     Comments: Large area of cellulitis to low back  Neurological:     General: No focal deficit present.     Mental Status: She is alert and oriented to person, place, and time.  Psychiatric:        Mood and Affect: Mood normal.        Behavior: Behavior normal.     ED Results / Procedures / Treatments   Labs (all labs ordered are listed, but only abnormal results are displayed) Labs Reviewed  CBC WITH DIFFERENTIAL/PLATELET - Abnormal; Notable for the following components:      Result Value   WBC 18.5  (*)    Hemoglobin 10.5 (*)    HCT 33.3 (*)    MCH 25.9 (*)    Neutro Abs 15.6 (*)    Monocytes Absolute 1.9 (*)    Abs Immature Granulocytes 0.14 (*)    All other components within normal limits  COMPREHENSIVE METABOLIC PANEL - Abnormal; Notable for the following components:   Sodium 130 (*)    Chloride 95 (*)    CO2 21 (*)    Glucose, Bld 176 (*)    BUN 51 (*)    Creatinine, Ser 3.24 (*)    Calcium 7.4 (*)    Albumin 2.8 (*)    AST 53 (*)    GFR, Estimated 13 (*)    All other components within normal limits  URINALYSIS, ROUTINE W REFLEX MICROSCOPIC - Abnormal; Notable for the following components:   Color, Urine AMBER (*)    APPearance HAZY (*)    Protein, ur 100 (*)    Bacteria, UA MANY (*)    All other components within normal limits  CBG MONITORING, ED - Abnormal; Notable for the following components:   Glucose-Capillary 154 (*)    All other components within normal limits  CULTURE, BLOOD (ROUTINE X 2)  CULTURE, BLOOD (ROUTINE X 2)  LACTIC ACID, PLASMA    EKG EKG Interpretation Date/Time:  Friday September 08 2022 12:31:35 EDT Ventricular Rate:  76 PR Interval:  166 QRS Duration:  92 QT Interval:  411 QTC Calculation: 463 R Axis:   53  Text Interpretation: Sinus rhythm No significant change since last tracing Confirmed by Jacalyn Lefevre [phone]) on 09/08/2022 2:56:24 PM  Radiology CT ABDOMEN PELVIS WO CONTRAST  Result Date: 09/08/2022 CLINICAL DATA:  Abdominal pain.  Sacral wound. EXAM: CT ABDOMEN AND PELVIS WITHOUT CONTRAST TECHNIQUE: Multidetector CT imaging of the abdomen  and pelvis was performed following the standard protocol without IV contrast. RADIATION DOSE REDUCTION: This exam was performed according to the departmental dose-optimization program which includes automated exposure control, adjustment of the mA and/or kV according to patient size and/or use of iterative reconstruction technique. COMPARISON:  June 11, 2019 FINDINGS: Lower chest: Bibasilar  atelectasis. Hepatobiliary: No focal liver abnormality is seen. Status post cholecystectomy. No biliary dilatation. Pancreas: Unremarkable. No pancreatic ductal dilatation or surrounding inflammatory changes. Spleen: Normal in size without focal abnormality. Adrenals/Urinary Tract: Normal adrenal glands. Right upper pole renal cyst measuring 2.2 cm normal left kidney. Normal bladder. Stomach/Bowel: Stomach is within normal limits. Appendix appears normal. No evidence of bowel wall thickening, distention, or inflammatory changes. Vascular/Lymphatic: Aortic atherosclerosis. No enlarged abdominal or pelvic lymph nodes. Reproductive: Status post hysterectomy. No adnexal masses. Other: Right sacral wound extends from the skin surface to the outer surface of the gluteal musculature. Significant gas collection is seen with surrounding fat stranding. The process does not seem to involve the rectum. Musculoskeletal: No acute or significant osseous findings. IMPRESSION: 1. Right sacral wound extends from the skin surface to the outer surface of the gluteal musculature. Significant gas collection is seen with surrounding fat stranding. The process does not seem to involve the rectum. 2. Aortic atherosclerosis. Aortic Atherosclerosis (ICD10-I70.0). Electronically Signed   By: Ted Mcalpine M.D.   On: 09/08/2022 14:56   DG Chest Portable 1 View  Result Date: 09/08/2022 CLINICAL DATA:  Recent diagnosis of COVID with increasing weakness EXAM: PORTABLE CHEST 1 VIEW COMPARISON:  Chest radiograph dated 01/08/2022 FINDINGS: Normal lung volumes. No focal consolidations. Trace left pleural effusion. No pneumothorax. Enlarged cardiomediastinal silhouette. No acute osseous abnormality. IMPRESSION: 1. Trace left pleural effusion.  No focal consolidations. 2. Cardiomegaly. Electronically Signed   By: Agustin Cree M.D.   On: 09/08/2022 13:38    Procedures Procedures    Medications Ordered in ED Medications  sodium chloride 0.9  % bolus 500 mL (0 mLs Intravenous Stopped 09/08/22 1520)  piperacillin-tazobactam (ZOSYN) IVPB 3.375 g (0 g Intravenous Stopped 09/08/22 1444)  sodium chloride 0.9 % bolus 500 mL (500 mLs Intravenous New Bag/Given 09/08/22 1520)    ED Course/ Medical Decision Making/ A&P                             Medical Decision Making Amount and/or Complexity of Data Reviewed Labs: ordered. Radiology: ordered.  Risk Prescription drug management. Decision regarding hospitalization.   This patient presents to the ED for concern of sob and low back pain, this involves an extensive number of treatment options, and is a complaint that carries with it a high risk of complications and morbidity.  The differential diagnosis includes pna, abscess   Co morbidities that complicate the patient evaluation  dm, htn, and pancreatitis   Additional history obtained:  Additional history obtained from epic chart review External records from outside source obtained and reviewed including EMS report, family   Lab Tests:  I Ordered, and personally interpreted labs.  The pertinent results include:  cbc with wbc elevated at 18.5; cmp nl other than glucose elevated at 176, bun elevated at 51 and cr elevated at 3.24 (bun 28 and cr 1.45 on 01/08/22); ua + protein   Imaging Studies ordered:  I ordered imaging studies including cxr and ct abd/pelvis  I independently visualized and interpreted imaging which showed  CXR: Trace left pleural effusion.  No focal consolidations.  2.  Cardiomegaly.  CT abd/pelvis: Right sacral wound extends from the skin surface to the outer  surface of the gluteal musculature. Significant gas collection is  seen with surrounding fat stranding. The process does not seem to  involve the rectum.  2. Aortic atherosclerosis.    Aortic Atherosclerosis (ICD10-I70.0).   I agree with the radiologist interpretation   Cardiac Monitoring:  The patient was maintained on a cardiac monitor.   I personally viewed and interpreted the cardiac monitored which showed an underlying rhythm of: nsr   Medicines ordered and prescription drug management:  I ordered medication including zosyn  for cellulitis  Reevaluation of the patient after these medicines showed that the patient improved I have reviewed the patients home medicines and have made adjustments as needed   Test Considered:  ct   Critical Interventions:  Abx, fluids   Consultations Obtained:  I requested consultation with the hospitalist  and discussed lab and imaging findings as well as pertinent plan - they will admit   Problem List / ED Course:  Covid-19:  pt has been weak with a poor appetite AKI:  pt given IVFs Cellulitis from sacral wound:  zosyn started.  No rectal involvement   Reevaluation:  After the interventions noted above, I reevaluated the patient and found that they have :improved   Social Determinants of Health:  Lives in ALF   Dispostion:  After consideration of the diagnostic results and the patients response to treatment, I feel that the patent would benefit from admit.    Sheina C Cieslik was evaluated in Emergency Department on 09/08/2022 for the symptoms described in the history of present illness. She was evaluated in the context of the global COVID-19 pandemic, which necessitated consideration that the patient might be at risk for infection with the SARS-CoV-2 virus that causes COVID-19. Institutional protocols and algorithms that pertain to the evaluation of patients at risk for COVID-19 are in a state of rapid change based on information released by regulatory bodies including the CDC and federal and state organizations. These policies and algorithms were followed during the patient's care in the ED.         Final Clinical Impression(s) / ED Diagnoses Final diagnoses:  AKI (acute kidney injury) (HCC)  COVID-19  Cellulitis of multiple sites of buttock    Rx / DC  Orders ED Discharge Orders     None         Jacalyn Lefevre, MD 09/08/22 1528

## 2022-09-08 NOTE — H&P (Addendum)
History and Physical    Patient: [name] [name] ZOX:[id number] DOB: [birth year] DOA: 09/08/2022 DOS: the patient was seen and examined on 09/08/2022 PCP: [name], MD  Patient coming from: ALF/ILF  Chief Complaint:  Chief Complaint  Patient presents with   Weakness   Back Pain    HPI: [name] [name] is a 89 y.o. female with medical history significant of hypertension, diabetes mellitus type 2, prior stroke, pancreatitis, hyperlipidemia, CKD stage III, and morbid obesity presents to the ER from ALF due to severe weakness and lethargy.  Patient was recently diagnosed with COVID, started on Paxlovid, 7/10, on the same day she was diagnosed with cellulitis of the back and was placed on Bactrim.  Daughter at bedside helping with history, she reports patient not eating or drinking well for the past 3 to, days, she had diarrhea and today she was not able to stand up herself to use the bathroom.  Patient reports feeling weak, not hungry and very dry.  She also have cough and shortness of breath.  She denies any urinary symptoms, diarrhea, chest pain, palpitations and dizziness.  Upon ER evaluation she was found to have white count of 18K, GFR down to 13 from 35, SCr 3.24, lactic acid normal, UA not overly concerning for UTI.  Found to have on her sacral area, CT abdomen pelvis was performed , Which did not show any abscess.  Wound extends from the sacrum to the gluteal musculature with some gas collection but no rectal involvement.  She was given a dose of Zosyn in the ED along with IV fluids.   Review of Systems: As mentioned in the history of present illness. All other systems reviewed and are negative.  Past Medical History:  Diagnosis Date   Colon polyp    Diabetes mellitus 1992   Hypertension 1996   Pancreatitis    Past Surgical History:  Procedure Laterality Date   CHOLECYSTECTOMY     COLONOSCOPY     ERCP     PARTIAL HYSTERECTOMY  89years old   POLYPECTOMY     UPPER  GASTROINTESTINAL ENDOSCOPY     WRIST FRACTURE SURGERY  left arm,89years old   Social History:  reports that she has never smoked. She has never used smokeless tobacco. She reports that she does not drink alcohol and does not use drugs.  Allergies  Allergen Reactions   Amlodipine Other (See Comments)    EDEMA   Ace Inhibitors Cough    Other reaction(s): cough  Other Reaction(s): cough   Atorvastatin     Other reaction(s): muscle & joint aches, Other Other reaction(s): muscle & joint aches   Canagliflozin     INVOKANA  Other reaction(s): urinary tract infections  Other Reaction(s): urinary tract infections   Clonidine Hcl     Other reaction(s): Other, Unknown  Other reaction(s): Unknown  Other Reaction(s): Unknown   Amlodipine Besylate     Other reaction(s): Unknown  Other Reaction(s): Unknown   Atorvastatin Calcium     Other reaction(s): muscle & joint aches  Other Reaction(s): muscle & joint aches   Buprenorphine Other (See Comments)   Morphine And Codeine Rash and Other (See Comments)    For long periods of time (??)   Morphine Sulfate Rash    Other reaction(s): rash    History reviewed.   Prior to Admission medications   Medication Sig Start Date End Date Taking? Authorizing Provider  ACCU-CHEK GUIDE test strip  11/08/20   [provider]  acetaminophen (  TYLENOL) 500 MG tablet Take by mouth.    [provider]  albuterol (VENTOLIN HFA) 108 (90 Base) MCG/ACT inhaler 1 puff as needed    [provider]  amantadine (SYMMETREL) 100 MG capsule Take 1 capsule (100 mg total) by mouth daily. 02/12/20   Love, Evlyn Kanner, PA-C  ASPERCREME/ALOE 10 % cream Apply topically. 09/14/21   [provider]  ASPIRIN 81 PO SMARTSIG:1 Tablet(s) By Mouth Daily 10/07/21   [provider]  atorvastatin (LIPITOR) 40 MG tablet Take 1 tablet (40 mg total) by mouth daily. 04/13/20   Ihor Austin, NP  azelastine (ASTELIN) 0.1 % nasal spray  06/22/20    [provider]  benzonatate (TESSALON) 100 MG capsule Take 1 capsule (100 mg total) by mouth every 8 (eight) hours. 01/08/22   Darrick Grinder, PA-C  Calcium Carbonate-Vit D-Min (CALCIUM 1200 PO)     [provider]  chlorthalidone (HYGROTON) 25 MG tablet Take 1.5 tablets (37.5 mg total) by mouth daily. 02/12/20   Love, Evlyn Kanner, PA-C  chlorthalidone (HYGROTON) 50 MG tablet Take 1 tablet by mouth daily.    [provider]  CLONIDINE HCL PO     [provider]  Continuous Blood Gluc Receiver (DEXCOM G6 RECEIVER) DEVI 1 each by Does not apply route daily. 10/28/20   Raulkar, Drema Pry, MD  Continuous Blood Gluc Sensor (DEXCOM G6 SENSOR) MISC 1 each by Does not apply route daily. 10/28/20   Raulkar, Drema Pry, MD  Continuous Blood Gluc Transmit (DEXCOM G6 TRANSMITTER) MISC 1 each by Does not apply route daily. 10/28/20   Raulkar, Drema Pry, MD  dapagliflozin propanediol (FARXIGA) 5 MG TABS tablet  09/22/21   [provider]  diclofenac Sodium (VOLTAREN ARTHRITIS PAIN) 1 % GEL Apply 2 g topically 4 (four) times daily. 04/24/22   Raulkar, Drema Pry, MD  diclofenac Sodium (VOLTAREN) 1 % GEL Apply topically. 02/12/20   [provider]  DROPSAFE SAFETY PEN NEEDLES 31G X 6 MM MISC  10/20/20   [provider]  furosemide (LASIX) 20 MG tablet Take 0.5 tablets (10 mg total) by mouth See admin instructions. 02/12/20   Love, Evlyn Kanner, PA-C  HUMALOG KWIKPEN 100 UNIT/ML KwikPen Inject into the skin. 11/01/20   [provider]  hydrALAZINE (APRESOLINE) 100 MG tablet Take 1 tablet (100 mg total) by mouth 3 (three) times daily. 02/12/20   Love, Evlyn Kanner, PA-C  insulin glargine (LANTUS) 100 UNIT/ML injection Inject into the skin. 02/12/20   [provider]  insulin isophane & regular human KwikPen (HUMULIN 70/30 KWIKPEN) (70-30) 100 UNIT/ML KwikPen INJECT 100 UNITS SUBCUTANEOUSLY BEFORE BREAKFAST THEN INJECT 60 TO 80 UNITS BEFORE LUNCH AND THEN 70  UNITS BEFORE EVENING MEAL AS DIRECTED INJECT THREE TIMES DAILY    [provider]  insulin lispro (HUMALOG) 100 UNIT/ML injection Inject into the skin 3 (three) times daily before meals.    [provider]  insulin NPH-regular Human (NOVOLIN 70/30) (70-30) 100 UNIT/ML injection     [provider]  Insulin Syringe-Needle U-100 31G X 5/16" 1 ML MISC 1 application by Does not apply route 2 (two) times daily. 02/12/20   Love, Evlyn Kanner, PA-C  LANTUS SOLOSTAR 100 UNIT/ML Solostar Pen Inject into the skin. 11/08/20   [provider]  lidocaine (LIDODERM) 5 % APPLY (1) PATCH TO LOWER BACK AND RIGHT KNEE ONCE DAILY. LEAVE ON 12 HOURS. LEAVE OFF 12 HOURS. 03/29/22   Raulkar, Drema Pry, MD  Lidocaine HCl (  ASPERCREME LIDOCAINE) 4 % CREA 1 application    [provider]  loratadine (CLARITIN) 10 MG tablet 1 tablet    [provider]  losartan (COZAAR) 100 MG tablet Take 1 tablet by mouth daily.    [provider]  magnesium oxide (MAG-OX) 400 MG tablet 1 tablet as needed    [provider]  metoprolol (TOPROL-XL) 200 MG 24 hr tablet Take 1 tablet by mouth daily.    [provider]  metoprolol tartrate (LOPRESSOR) 100 MG tablet Take 1 tablet (100 mg total) by mouth 2 (two) times daily. 02/12/20   Love, Evlyn Kanner, PA-C  miconazole (BAZA ANTIFUNGAL) 2 % cream Apply topically 2 (two) times daily. 03/27/22   Raulkar, Drema Pry, MD  pantoprazole (PROTONIX) 40 MG tablet Take 1 tablet (40 mg total) by mouth daily. 02/12/20 02/11/21  Love, Evlyn Kanner, PA-C  polyethylene glycol (MIRALAX / GLYCOLAX) 17 g packet Take 17 g by mouth 2 (two) times daily. 02/12/20   Love, Evlyn Kanner, PA-C  Potassium 99 MG TABS     [provider]  potassium chloride (KLOR-CON) 10 MEQ tablet Take 10 mEq by mouth daily. 07/10/20   [provider]  Pravastatin Sodium (PRAVACHOL PO)     [provider]  QUEtiapine (SEROQUEL) 25 MG tablet Take 0.5  tablets (12.5 mg total) by mouth at bedtime as needed (anxiety). 03/23/20   Jones Bales, NP  senna-docusate (SENOKOT-S) 8.6-50 MG tablet Take 1 tablet by mouth 2 (two) times daily. 02/12/20   Love, Evlyn Kanner, PA-C  triamcinolone cream (KENALOG) 0.1 % Apply topically. 10/01/20   [provider]    Physical Exam: Vitals:   09/08/22 1222 09/08/22 1445 09/08/22 1530 09/08/22 1609  BP:  (!) 162/58 (!) 163/41 (!) 188/70  Pulse:  87 80 84  Resp:  18 18 (!) 22  Temp:    99.3 F (37.4 C)  TempSrc:    Oral  SpO2:  98% 98% 96%  Weight: 99.8 kg     Height: 5\' 6"  (1.676 m)       Constitutional: Ill, calm, comfortable Eyes: PERRL, lids and conjunctivae normal ENMT: Mucous membranes are dry.  Neck: normal, supple, no masses, no thyromegaly Respiratory: clear to auscultation bilaterally, + wheezing, no crackles. Normal respiratory effort. No accessory muscle use.  Cardiovascular: Regular rate and rhythm, no murmurs / rubs / gallops. Bilateral lower extremity edema, trace to 1+ pitting. 2+ pedal pulses.   Abdomen: no tenderness, no masses palpated. No hepatosplenomegaly. Bowel sounds positive.  Musculoskeletal: Generalized muscle weakness. Skin: Large erythematous area sacrum extending to the buttocks, there is a pressure ulcer at the sacrum, area is tender and warm. Neurologic: CN 2-12 grossly intact. Sensation intact, DTR normal. Strength 5/5 in all 4.  Psychiatric: Normal judgment and insight. Alert and oriented x 3. Normal mood.   Data Reviewed: EKG normal sinus rhythm, no changes from prior.  Results are pending, will review when available.  Assessment and Plan: Service: Hospitalist  #Cellulitis from possible sacral pressure ulcer Severe cellulitis from possible sacral ulcer POA, will admit to medical floor for IV antibiotics.  No signs of sepsis at this time.  Monitor telemetry.  Daily wound care, wound care consult.  Keep area clean and dry.  #AKI on CKD stage III AKI in  setting of infectious process, poor oral intake, chorthalidone and NSAID's .  Will continue IV fluid, recheck BMP now and in the morning. If no improvement may need nephrology consult.   #  COVID Supportive treatment, albuterol PRN for wheezing/shortness of breath. Given renal failure will hold Paxlovid  #Hypertension BP slightly elevated, holding losartan, chlorthalidone and Lasix.  Will continue metoprolol hydralazine.  Lopressor as needed for SBP>170.  Monitor blood pressure closely.  #Diabetes mellitus type 2 Start insulin sliding scale, hold Farxiga given renal function.  #Weakness PT/OT.  Advance Care Planning:   Code Status: Full Code   Consults: Pharmacy and wound care   Family Communication: Daughter at bed side   Severity of Illness: The appropriate patient status for this patient is INPATIENT. Inpatient status is judged to be reasonable and necessary in order to provide the required intensity of service to ensure the patient's safety. The patient's presenting symptoms, physical exam findings, and initial radiographic and laboratory data in the context of their chronic comorbidities is felt to place them at high risk for further clinical deterioration. Furthermore, it is not anticipated that the patient will be medically stable for discharge from the hospital within 2 midnights of admission.   * I certify that at the point of admission it is my clinical judgment that the patient will require inpatient hospital care spanning beyond 2 midnights from the point of admission due to high intensity of service, high risk for further deterioration and high frequency of surveillance required.*  Author: Latrelle Dodrill, MD 09/08/2022 4:30 PM  For on call review [email or web address].

## 2022-09-08 NOTE — ED Notes (Signed)
ED TO INPATIENT HANDOFF REPORT  Name/Age/Gender [name] [name] 89 y.o. female  Code Status    Code Status Orders  (From admission, onward)           Start     Ordered   09/08/22 1549  Full code  Continuous       Question:  By:  Answer:  Consent: discussion documented in EHR   09/08/22 1552           Code Status History     Date Active Date Inactive Code Status Order ID Comments User Context   01/17/2020 1457 02/12/2020 1813 Full Code [id number]  Bluford Kaufmann, RN Inpatient   01/16/2020 1651 01/17/2020 1335 DNR [id number]  Jacquelynn Cree, PA-C Inpatient   01/16/2020 1651 01/16/2020 1651 DNR [id number]  Jacquelynn Cree, PA-C Inpatient   01/08/2020 0914 01/16/2020 1629 DNR [id number]  Versie Starks, DO Inpatient   01/04/2020 1436 01/08/2020 0914 Full Code [id number]  Gordy Councilman, MD ED       Home/SNF/Other Skilled nursing facility  Chief Complaint Cellulitis [L03.90]  Level of Care/Admitting Diagnosis ED Disposition     ED Disposition  Admit   Condition  --   Comment  Hospital Area: [facility] [[id number]]  Level of Care: Med-Surg [16]  May admit patient to [facility] or [facility] if equivalent level of care is available:: No  Covid Evaluation: Confirmed COVID Positive  Diagnosis: Cellulitis [244010]  Admitting Physician: Randel Pigg, EDWIN [[id number]]  Attending Physician: Randel Pigg, EDWIN [phone]  Certification:: I certify this patient will need inpatient services for at least 2 midnights  Estimated Length of Stay: 3          Medical History Past Medical History:  Diagnosis Date   Colon polyp    Diabetes mellitus 1992   Hypertension 1996   Pancreatitis     Allergies Allergies  Allergen Reactions   Amlodipine Other (See Comments)    EDEMA and "Allergic," per [facility]   Ace Inhibitors Other (See Comments) and Cough    "Allergic," per [facility]   Atorvastatin Other (See Comments)    Muscle & joint aches and "Allergic,"  per [facility]   Canagliflozin Other (See Comments)    INVOKANA = urinary tract infections and "Allergic," per [facility]   Clonidine Hcl Other (See Comments)    "Allergic," per [facility]   Buprenorphine Other (See Comments)    "Allergic," per [facility]   Lisinopril Other (See Comments)    "Allergic," per [facility]   Morphine And Codeine Rash and Other (See Comments)    For long periods of time (??) and "Allergic," per [facility]    IV Location/Drains/Wounds Patient Lines/Drains/Airways Status     Active Line/Drains/Airways     Name Placement date Placement time Site Days   Peripheral IV 09/08/22 20 G Posterior;Right Forearm 09/08/22  1214  Forearm  less than 1   Wound / Incision (Open or Dehisced) 01/04/20 (MASD) Moisture Associated Skin Damage Abdomen Right;Left MASD [place] 01/04/20  1551  Abdomen  978            Labs/Imaging Results for orders placed or performed during the hospital encounter of 09/08/22 (from the past 48 hour(s))  Urinalysis, Routine w reflex microscopic -Urine, Clean Catch     Status: Abnormal   Collection Time: 09/08/22 12:22 PM  Result Value Ref Range   Color, Urine AMBER (A) YELLOW    Comment: BIOCHEMICALS MAY BE AFFECTED BY COLOR   APPearance HAZY (  A) CLEAR   Specific Gravity, Urine 1.017 1.005 - 1.030   pH 5.0 5.0 - 8.0   Glucose, UA NEGATIVE NEGATIVE mg/dL   Hgb urine dipstick NEGATIVE NEGATIVE   Bilirubin Urine NEGATIVE NEGATIVE   Ketones, ur NEGATIVE NEGATIVE mg/dL   Protein, ur 147 (A) NEGATIVE mg/dL   Nitrite NEGATIVE NEGATIVE   Leukocytes,Ua NEGATIVE NEGATIVE   RBC / HPF 0-5 0 - 5 RBC/hpf   WBC, UA 6-10 0 - 5 WBC/hpf   Bacteria, UA MANY (A) NONE SEEN   Squamous Epithelial / HPF 0-5 0 - 5 /HPF   Hyaline Casts, UA PRESENT     Comment: Performed at [facility], [address], [place], [place] [address]  CBG monitoring, ED     Status: Abnormal   Collection Time: 09/08/22 12:33 PM  Result Value Ref Range   Glucose-Capillary 154 (H) 70 - 99 mg/dL     Comment: Glucose reference range applies only to samples taken after fasting for at least 8 hours.  CBC with Differential     Status: Abnormal   Collection Time: 09/08/22 12:34 PM  Result Value Ref Range   WBC 18.5 (H) 4.0 - 10.5 K/uL   RBC 4.05 3.87 - 5.11 MIL/uL   Hemoglobin 10.5 (L) 12.0 - 15.0 g/dL   HCT 21.3 (L) 08.6 - 57.8 %   MCV 82.2 80.0 - 100.0 fL   MCH 25.9 (L) 26.0 - 34.0 pg   MCHC 31.5 30.0 - 36.0 g/dL   RDW 46.9 62.9 - 52.8 %   Platelets 240 150 - 400 K/uL   nRBC 0.0 0.0 - 0.2 %   Neutrophils Relative % 84 %   Neutro Abs 15.6 (H) 1.7 - 7.7 K/uL   Lymphocytes Relative 5 %   Lymphs Abs 0.8 0.7 - 4.0 K/uL   Monocytes Relative 10 %   Monocytes Absolute 1.9 (H) 0.1 - 1.0 K/uL   Eosinophils Relative 0 %   Eosinophils Absolute 0.0 0.0 - 0.5 K/uL   Basophils Relative 0 %   Basophils Absolute 0.0 0.0 - 0.1 K/uL   Immature Granulocytes 1 %   Abs Immature Granulocytes 0.14 (H) 0.00 - 0.07 K/uL    Comment: Performed at [facility], [address], [place], [place] [address]  Comprehensive metabolic panel     Status: Abnormal   Collection Time: 09/08/22 12:34 PM  Result Value Ref Range   Sodium 130 (L) 135 - 145 mmol/L   Potassium 4.4 3.5 - 5.1 mmol/L   Chloride 95 (L) 98 - 111 mmol/L   CO2 21 (L) 22 - 32 mmol/L   Glucose, Bld 176 (H) 70 - 99 mg/dL    Comment: Glucose reference range applies only to samples taken after fasting for at least 8 hours.   BUN 51 (H) 8 - 23 mg/dL   Creatinine, Ser 4.01 (H) 0.44 - 1.00 mg/dL   Calcium 7.4 (L) 8.9 - 10.3 mg/dL   Total Protein 6.8 6.5 - 8.1 g/dL   Albumin 2.8 (L) 3.5 - 5.0 g/dL   AST 53 (H) 15 - 41 U/L   ALT 26 0 - 44 U/L   Alkaline Phosphatase 73 38 - 126 U/L   Total Bilirubin 0.8 0.3 - 1.2 mg/dL   GFR, Estimated 13 (L) >60 mL/min    Comment: (NOTE) Calculated using the CKD-EPI Creatinine Equation (2021)    Anion gap 14 5 - 15    Comment: Performed at [facility],  [address], [place], [place] [address]  Lactic acid, plasma     Status: None   Collection Time: 09/08/22  1:23 PM  Result Value Ref Range   Lactic Acid, Venous 1.8 0.5 - 1.9 mmol/L    Comment: Performed at [facility], [address] [place], [place] [address]  Basic metabolic panel     Status: Abnormal   Collection Time: 09/08/22  4:05 PM  Result Value Ref Range   Sodium 132 (L) 135 - 145 mmol/L   Potassium 4.3 3.5 - 5.1 mmol/L   Chloride 97 (L) 98 - 111 mmol/L   CO2 22 22 - 32 mmol/L   Glucose, Bld 206 (H) 70 - 99 mg/dL    Comment: Glucose reference range applies only to samples taken after fasting for at least 8 hours.   BUN 50 (H) 8 - 23 mg/dL   Creatinine, Ser 1.47 (H) 0.44 - 1.00 mg/dL   Calcium 7.4 (L) 8.9 - 10.3 mg/dL   GFR, Estimated 15 (L) >60 mL/min    Comment: (NOTE) Calculated using the CKD-EPI Creatinine Equation (2021)    Anion gap 13 5 - 15    Comment: Performed at [facility], [address] [place], [place] [address]  CBG monitoring, ED     Status: Abnormal   Collection Time: 09/08/22  5:43 PM  Result Value Ref Range   Glucose-Capillary 194 (H) 70 - 99 mg/dL    Comment: Glucose reference range applies only to samples taken after fasting for at least 8 hours.   CT ABDOMEN PELVIS WO CONTRAST  Result Date: 09/08/2022 CLINICAL DATA:  Abdominal pain.  Sacral wound. EXAM: CT ABDOMEN AND PELVIS WITHOUT CONTRAST TECHNIQUE: Multidetector CT imaging of the abdomen and pelvis was performed following the standard protocol without IV contrast. RADIATION DOSE REDUCTION: This exam was performed according to the departmental dose-optimization program which includes automated exposure control, adjustment of the mA and/or kV according to patient size and/or use of iterative reconstruction technique. COMPARISON:  June 11, 2019 FINDINGS: Lower chest: Bibasilar atelectasis. Hepatobiliary: No focal liver abnormality is seen. Status post cholecystectomy. No  biliary dilatation. Pancreas: Unremarkable. No pancreatic ductal dilatation or surrounding inflammatory changes. Spleen: Normal in size without focal abnormality. Adrenals/Urinary Tract: Normal adrenal glands. Right upper pole renal cyst measuring 2.2 cm normal left kidney. Normal bladder. Stomach/Bowel: Stomach is within normal limits. Appendix appears normal. No evidence of bowel wall thickening, distention, or inflammatory changes. Vascular/Lymphatic: Aortic atherosclerosis. No enlarged abdominal or pelvic lymph nodes. Reproductive: Status post hysterectomy. No adnexal masses. Other: Right sacral wound extends from the skin surface to the outer surface of the gluteal musculature. Significant gas collection is seen with surrounding fat stranding. The process does not seem to involve the rectum. Musculoskeletal: No acute or significant osseous findings. IMPRESSION: 1. Right sacral wound extends from the skin surface to the outer surface of the gluteal musculature. Significant gas collection is seen with surrounding fat stranding. The process does not seem to involve the rectum. 2. Aortic atherosclerosis. Aortic Atherosclerosis (ICD10-I70.0). Electronically Signed   By: Ted Mcalpine M.D.   On: 09/08/2022 14:56   DG Chest Portable 1 View  Result Date: 09/08/2022 CLINICAL DATA:  Recent diagnosis of COVID with increasing weakness EXAM: PORTABLE CHEST 1 VIEW COMPARISON:  Chest radiograph dated 01/08/2022 FINDINGS: Normal lung volumes. No focal consolidations. Trace left pleural effusion. No pneumothorax. Enlarged cardiomediastinal silhouette. No acute osseous abnormality. IMPRESSION: 1. Trace left pleural effusion.  No  focal consolidations. 2. Cardiomegaly. Electronically Signed   By: Agustin Cree M.D.   On: 09/08/2022 13:38    Pending Labs Unresulted Labs (From admission, onward)     Start     Ordered   09/09/22 0500  Comprehensive metabolic panel  Tomorrow morning,   R        09/08/22 1552   09/09/22  0500  CBC  Tomorrow morning,   R        09/08/22 1552   09/08/22 1652  Hemoglobin A1c  Once,   R       Comments: To assess prior glycemic control    09/08/22 1652   09/08/22 1323  Culture, blood (routine x 2)  BLOOD CULTURE X 2,   R (with STAT occurrences)      09/08/22 1322            Vitals/Pain Today's Vitals   09/08/22 1445 09/08/22 1530 09/08/22 1609 09/08/22 1800  BP: (!) 162/58 (!) 163/41 (!) 188/70 (!) 152/91  Pulse: 87 80 84 80  Resp: 18 18 (!) 22 18  Temp:   99.3 F (37.4 C)   TempSrc:   Oral   SpO2: 98% 98% 96% 97%  Weight:      Height:      PainSc:        Isolation Precautions No active isolations  Medications Medications  hydrALAZINE (APRESOLINE) tablet 100 mg (has no administration in time range)  metoprolol succinate (TOPROL-XL) 24 hr tablet 200 mg (has no administration in time range)  albuterol (VENTOLIN HFA) 108 (90 Base) MCG/ACT inhaler 2 puff (has no administration in time range)  HYDROcodone-acetaminophen (NORCO/VICODIN) 5-325 MG per tablet 1-2 tablet (has no administration in time range)  ondansetron (ZOFRAN) tablet 4 mg (has no administration in time range)    Or  ondansetron (ZOFRAN) injection 4 mg (has no administration in time range)  polyethylene glycol (MIRALAX / GLYCOLAX) packet 17 g (has no administration in time range)  0.9 %  sodium chloride infusion ( Intravenous New Bag/Given 09/08/22 1625)  insulin aspart (novoLOG) injection 0-9 Units (2 Units Subcutaneous Given 09/08/22 1808)  insulin aspart (novoLOG) injection 0-5 Units (has no administration in time range)  insulin aspart (novoLOG) injection 3 Units (3 Units Subcutaneous Not Given 09/08/22 1744)  metoprolol tartrate (LOPRESSOR) injection 5 mg (has no administration in time range)  sodium chloride 0.9 % bolus 500 mL (0 mLs Intravenous Stopped 09/08/22 1520)  piperacillin-tazobactam (ZOSYN) IVPB 3.375 g (0 g Intravenous Stopped 09/08/22 1444)  sodium chloride 0.9 % bolus 500 mL (0 mLs  Intravenous Stopped 09/08/22 1625)    Mobility non-ambulatory   A&O x 4. Incontinent. On room air.

## 2022-09-08 NOTE — ED Triage Notes (Signed)
[name] from [facility]. Patient dx with COVID 7/10 and has had increasing weakness. Patient also reports cough, fevers, and back pain.  Patient has wound on sacrum. Wound cleansed and mepilex in place.

## 2022-09-08 NOTE — ED Notes (Signed)
Lab called to add on A1c 

## 2022-09-08 NOTE — Progress Notes (Signed)
A consult was received from an ED physician for Zosyn per pharmacy dosing.  The patient's profile has been reviewed for ht/wt/allergies/indication/available labs.   A one time order has been placed for Zosyn 3.375g IV x 1.  Further antibiotics/pharmacy consults should be ordered by admitting physician if indicated.                  Johnn Krasowski S. Merilynn Finland, PharmD, BCPS Clinical Staff Pharmacist [email or web address]       Thank you, Pasty Spillers 09/08/2022  1:36 PM

## 2022-09-09 ENCOUNTER — Inpatient Hospital Stay (HOSPITAL_COMMUNITY): Payer: Medicare Other

## 2022-09-09 DIAGNOSIS — L03312 Cellulitis of back [any part except buttock]: Secondary | ICD-10-CM | POA: Diagnosis not present

## 2022-09-09 LAB — CBC
HCT: 31.5 % — ABNORMAL LOW (ref 36.0–46.0)
Hemoglobin: 9.8 g/dL — ABNORMAL LOW (ref 12.0–15.0)
MCH: 26.1 pg (ref 26.0–34.0)
MCHC: 31.1 g/dL (ref 30.0–36.0)
MCV: 83.8 fL (ref 80.0–100.0)
Platelets: 238 10*3/uL (ref 150–400)
RBC: 3.76 MIL/uL — ABNORMAL LOW (ref 3.87–5.11)
RDW: 15.5 % (ref 11.5–15.5)
WBC: 16.7 10*3/uL — ABNORMAL HIGH (ref 4.0–10.5)
nRBC: 0 % (ref 0.0–0.2)

## 2022-09-09 LAB — BLOOD CULTURE ID PANEL (REFLEXED) - BCID2

## 2022-09-09 LAB — BLOOD GAS, ARTERIAL
FIO2: 28 %
O2 Saturation: 99.1 %
pCO2 arterial: 37 mmHg (ref 32–48)
pH, Arterial: 7.42 (ref 7.35–7.45)
pO2, Arterial: 99 mmHg (ref 83–108)

## 2022-09-09 LAB — COMPREHENSIVE METABOLIC PANEL
ALT: 28 U/L (ref 0–44)
AST: 38 U/L (ref 15–41)
Albumin: 2.4 g/dL — ABNORMAL LOW (ref 3.5–5.0)
Alkaline Phosphatase: 79 U/L (ref 38–126)
Anion gap: 14 (ref 5–15)
BUN: 55 mg/dL — ABNORMAL HIGH (ref 8–23)
CO2: 21 mmol/L — ABNORMAL LOW (ref 22–32)
Calcium: 7.3 mg/dL — ABNORMAL LOW (ref 8.9–10.3)
Chloride: 96 mmol/L — ABNORMAL LOW (ref 98–111)
Creatinine, Ser: 2.82 mg/dL — ABNORMAL HIGH (ref 0.44–1.00)
GFR, Estimated: 16 mL/min — ABNORMAL LOW (ref >60)
Glucose, Bld: 259 mg/dL — ABNORMAL HIGH (ref 70–99)
Potassium: 4.4 mmol/L (ref 3.5–5.1)
Sodium: 131 mmol/L — ABNORMAL LOW (ref 135–145)
Total Bilirubin: 1 mg/dL (ref 0.3–1.2)
Total Protein: 6.1 g/dL — ABNORMAL LOW (ref 6.5–8.1)

## 2022-09-09 LAB — HEMOGLOBIN A1C
Hgb A1c MFr Bld: 9.8 % — ABNORMAL HIGH (ref 4.8–5.6)
Mean Plasma Glucose: 235 mg/dL

## 2022-09-09 LAB — GLUCOSE, CAPILLARY
Glucose-Capillary: 270 mg/dL — ABNORMAL HIGH (ref 70–99)
Glucose-Capillary: 268 mg/dL — ABNORMAL HIGH (ref 70–99)

## 2022-09-09 LAB — MRSA NEXT GEN BY PCR, NASAL: MRSA by PCR Next Gen: NOT DETECTED

## 2022-09-09 MED ORDER — FUROSEMIDE 10 MG/ML IJ SOLN
60.0000 mg | Freq: Once | INTRAMUSCULAR | Status: AC
  Administered 2022-09-09: 60 mg via INTRAVENOUS
  Filled 2022-09-09: qty 6

## 2022-09-09 MED ORDER — SODIUM CHLORIDE 0.9 % IV SOLN
2.0000 g | INTRAVENOUS | Status: DC
  Administered 2022-09-09 – 2022-09-15 (×7): 2 g via INTRAVENOUS
  Filled 2022-09-09 (×7): qty 12.5

## 2022-09-09 MED ORDER — METHYLPREDNISOLONE SODIUM SUCC 125 MG IJ SOLR
125.0000 mg | Freq: Once | INTRAMUSCULAR | Status: AC
  Administered 2022-09-09: 125 mg via INTRAVENOUS
  Filled 2022-09-09: qty 2

## 2022-09-09 MED ORDER — INSULIN GLARGINE-YFGN 100 UNIT/ML ~~LOC~~ SOLN
25.0000 [IU] | Freq: Every day | SUBCUTANEOUS | Status: DC
  Administered 2022-09-10: 25 [IU] via SUBCUTANEOUS
  Filled 2022-09-09: qty 0.25

## 2022-09-09 MED ORDER — VANCOMYCIN HCL 750 MG/150ML IV SOLN
750.0000 mg | INTRAVENOUS | Status: DC
  Administered 2022-09-10: 750 mg via INTRAVENOUS
  Filled 2022-09-09: qty 150

## 2022-09-09 MED ORDER — HEPARIN SODIUM (PORCINE) 5000 UNIT/ML IJ SOLN
5000.0000 [IU] | Freq: Three times a day (TID) | INTRAMUSCULAR | Status: DC
  Administered 2022-09-09 – 2022-09-22 (×38): 5000 [IU] via SUBCUTANEOUS
  Filled 2022-09-09 (×38): qty 1

## 2022-09-09 MED ORDER — INSULIN ASPART 100 UNIT/ML IJ SOLN
0.0000 [IU] | Freq: Three times a day (TID) | INTRAMUSCULAR | Status: DC
  Administered 2022-09-09: 11 [IU] via SUBCUTANEOUS
  Administered 2022-09-10: 15 [IU] via SUBCUTANEOUS
  Administered 2022-09-10 (×2): 20 [IU] via SUBCUTANEOUS
  Administered 2022-09-11: 15 [IU] via SUBCUTANEOUS
  Administered 2022-09-11: 11 [IU] via SUBCUTANEOUS
  Administered 2022-09-11 – 2022-09-12 (×2): 15 [IU] via SUBCUTANEOUS
  Administered 2022-09-12: 6 [IU] via SUBCUTANEOUS
  Administered 2022-09-13: 15 [IU] via SUBCUTANEOUS
  Administered 2022-09-13: 4 [IU] via SUBCUTANEOUS
  Administered 2022-09-13: 11 [IU] via SUBCUTANEOUS
  Administered 2022-09-14: 4 [IU] via SUBCUTANEOUS
  Administered 2022-09-14: 7 [IU] via SUBCUTANEOUS
  Administered 2022-09-14: 4 [IU] via SUBCUTANEOUS
  Administered 2022-09-15: 7 [IU] via SUBCUTANEOUS
  Administered 2022-09-15 – 2022-09-16 (×3): 4 [IU] via SUBCUTANEOUS
  Administered 2022-09-16: 11 [IU] via SUBCUTANEOUS
  Administered 2022-09-17 (×3): 7 [IU] via SUBCUTANEOUS
  Administered 2022-09-18: 3 [IU] via SUBCUTANEOUS
  Administered 2022-09-18: 4 [IU] via SUBCUTANEOUS
  Administered 2022-09-19 (×2): 3 [IU] via SUBCUTANEOUS
  Administered 2022-09-20 – 2022-09-21 (×4): 4 [IU] via SUBCUTANEOUS
  Administered 2022-09-22: 3 [IU] via SUBCUTANEOUS

## 2022-09-09 MED ORDER — ALBUTEROL SULFATE (2.5 MG/3ML) 0.083% IN NEBU
3.0000 mL | INHALATION_SOLUTION | RESPIRATORY_TRACT | Status: DC | PRN
  Administered 2022-09-18 – 2022-09-21 (×3): 3 mL via RESPIRATORY_TRACT
  Filled 2022-09-09 (×3): qty 3

## 2022-09-09 MED ORDER — GUAIFENESIN-DM 100-10 MG/5ML PO SYRP
5.0000 mL | ORAL_SOLUTION | ORAL | Status: DC | PRN
  Administered 2022-09-09: 5 mL via ORAL
  Filled 2022-09-09: qty 5

## 2022-09-09 MED ORDER — INSULIN ASPART 100 UNIT/ML IJ SOLN
10.0000 [IU] | Freq: Three times a day (TID) | INTRAMUSCULAR | Status: DC
  Administered 2022-09-09: 10 [IU] via SUBCUTANEOUS

## 2022-09-09 MED ORDER — METRONIDAZOLE 500 MG/100ML IV SOLN
500.0000 mg | Freq: Two times a day (BID) | INTRAVENOUS | Status: DC
  Administered 2022-09-09 – 2022-09-16 (×15): 500 mg via INTRAVENOUS
  Filled 2022-09-09 (×15): qty 100

## 2022-09-09 MED ORDER — IPRATROPIUM-ALBUTEROL 0.5-2.5 (3) MG/3ML IN SOLN
3.0000 mL | Freq: Three times a day (TID) | RESPIRATORY_TRACT | Status: DC
  Administered 2022-09-10 – 2022-09-11 (×4): 3 mL via RESPIRATORY_TRACT
  Filled 2022-09-09 (×4): qty 3

## 2022-09-09 MED ORDER — IPRATROPIUM-ALBUTEROL 0.5-2.5 (3) MG/3ML IN SOLN
3.0000 mL | Freq: Four times a day (QID) | RESPIRATORY_TRACT | Status: DC
  Administered 2022-09-09: 3 mL via RESPIRATORY_TRACT
  Filled 2022-09-09: qty 3

## 2022-09-09 NOTE — Plan of Care (Signed)
  Problem: Skin Integrity: Goal: Risk for impaired skin integrity will decrease Outcome: Progressing   Problem: Safety: Goal: Ability to remain free from injury will improve Outcome: Progressing   Problem: Pain Managment: Goal: General experience of comfort will improve Outcome: Progressing   Problem: Elimination: Goal: Will not experience complications related to urinary retention Outcome: Progressing   Problem: Coping: Goal: Level of anxiety will decrease 09/09/2022 0325 by Thea Gist, RN Outcome: Progressing 09/09/2022 0325 by Thea Gist, RN Outcome: Progressing

## 2022-09-09 NOTE — Consult Note (Addendum)
Reason for Consult: Sacral decubiti Referring Physician: Hanley Ben MD   [name] [name] is an 89 y.o. female.  HPI: Asked see patient for sacral decubiti.  She was admitted with COVID and failure to thrive.  She was found to have a sacral decubiti this been present for at least a month.  There is some drainage and foul smell.  She is currently being treated for COVID in the hospital.  She complains of extreme shortness of breath and pain over her hips.  Her daughter is in the room.  Past Medical History:  Diagnosis Date   Colon polyp    Diabetes mellitus 1992   Hypertension 1996   Pancreatitis     Past Surgical History:  Procedure Laterality Date   CHOLECYSTECTOMY     COLONOSCOPY     ERCP     PARTIAL HYSTERECTOMY  89years old   POLYPECTOMY     UPPER GASTROINTESTINAL ENDOSCOPY     WRIST FRACTURE SURGERY  left arm,88years old    History reviewed. No pertinent family history.  Social History:  reports that she has never smoked. She has never used smokeless tobacco. She reports that she does not drink alcohol and does not use drugs.  Allergies:  Allergies  Allergen Reactions   Amlodipine Other (See Comments)    EDEMA and "Allergic," per MAR   Ace Inhibitors Other (See Comments) and Cough    "Allergic," per MAR   Atorvastatin Other (See Comments)    Muscle & joint aches and "Allergic," per MAR   Canagliflozin Other (See Comments)    INVOKANA = urinary tract infections and "Allergic," per MAR   Clonidine Hcl Other (See Comments)    "Allergic," per MAR   Buprenorphine Other (See Comments)    "Allergic," per MAR   Lisinopril Other (See Comments)    "Allergic," per MAR   Morphine And Codeine Rash and Other (See Comments)    For long periods of time (??) and "Allergic," per MAR    Medications: I have reviewed the patient's current medications.  Results for orders placed or performed during the hospital encounter of 09/08/22 (from the past 48 hour(s))  Urinalysis, Routine w  reflex microscopic -Urine, Clean Catch     Status: Abnormal   Collection Time: 09/08/22 12:22 PM  Result Value Ref Range   Color, Urine AMBER (A) YELLOW    Comment: BIOCHEMICALS MAY BE AFFECTED BY COLOR   APPearance HAZY (A) CLEAR   Specific Gravity, Urine 1.017 1.005 - 1.030   pH 5.0 5.0 - 8.0   Glucose, UA NEGATIVE NEGATIVE mg/dL   Hgb urine dipstick NEGATIVE NEGATIVE   Bilirubin Urine NEGATIVE NEGATIVE   Ketones, ur NEGATIVE NEGATIVE mg/dL   Protein, ur 161 (A) NEGATIVE mg/dL   Nitrite NEGATIVE NEGATIVE   Leukocytes,Ua NEGATIVE NEGATIVE   RBC / HPF 0-5 0 - 5 RBC/hpf   WBC, UA 6-10 0 - 5 WBC/hpf   Bacteria, UA MANY (A) NONE SEEN   Squamous Epithelial / HPF 0-5 0 - 5 /HPF   Hyaline Casts, UA PRESENT     Comment: Performed at [facility], [address] [place], [place] [address]  CBG monitoring, ED     Status: Abnormal   Collection Time: 09/08/22 12:33 PM  Result Value Ref Range   Glucose-Capillary 154 (H) 70 - 99 mg/dL    Comment: Glucose reference range applies only to samples taken after fasting for at least 8 hours.  CBC with Differential  Status: Abnormal   Collection Time: 09/08/22 12:34 PM  Result Value Ref Range   WBC 18.5 (H) 4.0 - 10.5 K/uL   RBC 4.05 3.87 - 5.11 MIL/uL   Hemoglobin 10.5 (L) 12.0 - 15.0 g/dL   HCT 16.1 (L) 09.6 - 04.5 %   MCV 82.2 80.0 - 100.0 fL   MCH 25.9 (L) 26.0 - 34.0 pg   MCHC 31.5 30.0 - 36.0 g/dL   RDW 40.9 81.1 - 91.4 %   Platelets 240 150 - 400 K/uL   nRBC 0.0 0.0 - 0.2 %   Neutrophils Relative % 84 %   Neutro Abs 15.6 (H) 1.7 - 7.7 K/uL   Lymphocytes Relative 5 %   Lymphs Abs 0.8 0.7 - 4.0 K/uL   Monocytes Relative 10 %   Monocytes Absolute 1.9 (H) 0.1 - 1.0 K/uL   Eosinophils Relative 0 %   Eosinophils Absolute 0.0 0.0 - 0.5 K/uL   Basophils Relative 0 %   Basophils Absolute 0.0 0.0 - 0.1 K/uL   Immature Granulocytes 1 %   Abs Immature Granulocytes 0.14 (H) 0.00 - 0.07 K/uL    Comment: Performed at  [facility], [address] [place], [place] [address]  Comprehensive metabolic panel     Status: Abnormal   Collection Time: 09/08/22 12:34 PM  Result Value Ref Range   Sodium 130 (L) 135 - 145 mmol/L   Potassium 4.4 3.5 - 5.1 mmol/L   Chloride 95 (L) 98 - 111 mmol/L   CO2 21 (L) 22 - 32 mmol/L   Glucose, Bld 176 (H) 70 - 99 mg/dL    Comment: Glucose reference range applies only to samples taken after fasting for at least 8 hours.   BUN 51 (H) 8 - 23 mg/dL   Creatinine, Ser 6.21 (H) 0.44 - 1.00 mg/dL   Calcium 7.4 (L) 8.9 - 10.3 mg/dL   Total Protein 6.8 6.5 - 8.1 g/dL   Albumin 2.8 (L) 3.5 - 5.0 g/dL   AST 53 (H) 15 - 41 U/L   ALT 26 0 - 44 U/L   Alkaline Phosphatase 73 38 - 126 U/L   Total Bilirubin 0.8 0.3 - 1.2 mg/dL   GFR, Estimated 13 (L) >60 mL/min    Comment: (NOTE) Calculated using the CKD-EPI Creatinine Equation (2021)    Anion gap 14 5 - 15    Comment: Performed at [facility], [address] [place], [place] [address]  Hemoglobin A1c     Status: Abnormal   Collection Time: 09/08/22 12:34 PM  Result Value Ref Range   Hgb A1c MFr Bld 9.8 (H) 4.8 - 5.6 %    Comment: (NOTE)         Prediabetes: 5.7 - 6.4         Diabetes: >6.4         Glycemic control for adults with diabetes: <7.0    Mean Plasma Glucose 235 mg/dL    Comment: (NOTE) Performed At: [facility] [address] [place], [place] [id number] Jolene Schimke MD MW:[phone]   Lactic acid, plasma     Status: None   Collection Time: 09/08/22  1:23 PM  Result Value Ref Range   Lactic Acid, Venous 1.8 0.5 - 1.9 mmol/L    Comment: Performed at [facility], [address] [place], [place] [address]  Culture, blood (routine x 2)     Status: None (Preliminary result)   Collection Time: 09/08/22  1:28 PM  Specimen: BLOOD  Result Value Ref Range   Specimen Description      BLOOD BLOOD LEFT FOREARM Performed at [facility], [address], [place], [place] [address]    Special Requests      BOTTLES DRAWN AEROBIC AND ANAEROBIC Blood Culture results may not be optimal due to an inadequate volume of blood received in culture bottles Performed at [facility], [address] [place], [place] [address]    Culture      NO GROWTH < 24 HOURS Performed at [facility], [address] [place], [place] [address]    Report Status PENDING   Culture, blood (routine x 2)     Status: None (Preliminary result)   Collection Time: 09/08/22  1:59 PM   Specimen: BLOOD  Result Value Ref Range   Specimen Description      BLOOD BLOOD LEFT HAND Performed at [facility], [address], [place], [place] [address]    Special Requests      BOTTLES DRAWN AEROBIC ONLY Blood Culture results may not be optimal due to an inadequate volume of blood received in culture bottles Performed at [facility], [address], [place], [place] [address]    Culture      NO GROWTH < 24 HOURS Performed at [facility], [address], [place], [place] [address]    Report Status PENDING   Basic metabolic panel     Status: Abnormal   Collection Time: 09/08/22  4:05 PM  Result Value Ref Range   Sodium 132 (L) 135 - 145 mmol/L   Potassium 4.3 3.5 - 5.1 mmol/L   Chloride 97 (L) 98 - 111 mmol/L   CO2 22 22 - 32 mmol/L   Glucose, Bld 206 (H) 70 - 99 mg/dL    Comment: Glucose reference range applies only to samples taken after fasting for at least 8 hours.   BUN 50 (H) 8 - 23 mg/dL   Creatinine, Ser 1.32 (H) 0.44 - 1.00 mg/dL   Calcium 7.4 (L) 8.9 - 10.3 mg/dL   GFR, Estimated 15 (L) >60 mL/min    Comment: (NOTE) Calculated using the CKD-EPI Creatinine Equation (2021)    Anion gap 13 5 - 15    Comment: Performed at [facility], [address] [place], [place] [address]  CBG monitoring, ED     Status: Abnormal   Collection Time: 09/08/22   5:43 PM  Result Value Ref Range   Glucose-Capillary 194 (H) 70 - 99 mg/dL    Comment: Glucose reference range applies only to samples taken after fasting for at least 8 hours.  Glucose, capillary     Status: Abnormal   Collection Time: 09/08/22  9:08 PM  Result Value Ref Range   Glucose-Capillary 245 (H) 70 - 99 mg/dL    Comment: Glucose reference range applies only to samples taken after fasting for at least 8 hours.  MRSA Next Gen by PCR, Nasal     Status: None   Collection Time: 09/08/22 11:13 PM   Specimen: Nasal Mucosa; Nasal Swab  Result Value Ref Range   MRSA by PCR Next Gen NOT DETECTED NOT DETECTED    Comment: (NOTE) The GeneXpert MRSA Assay (FDA approved for NASAL specimens only), is one component of a comprehensive MRSA colonization surveillance program. It is not intended to diagnose MRSA infection nor to guide or monitor treatment for MRSA infections. Test performance is  not FDA approved in patients less than 34 years old. Performed at [facility], [address] [place], [place] [address]   Comprehensive metabolic panel     Status: Abnormal   Collection Time: 09/09/22  5:29 AM  Result Value Ref Range   Sodium 131 (L) 135 - 145 mmol/L   Potassium 4.4 3.5 - 5.1 mmol/L   Chloride 96 (L) 98 - 111 mmol/L   CO2 21 (L) 22 - 32 mmol/L   Glucose, Bld 259 (H) 70 - 99 mg/dL    Comment: Glucose reference range applies only to samples taken after fasting for at least 8 hours.   BUN 55 (H) 8 - 23 mg/dL   Creatinine, Ser 6.04 (H) 0.44 - 1.00 mg/dL   Calcium 7.3 (L) 8.9 - 10.3 mg/dL   Total Protein 6.1 (L) 6.5 - 8.1 g/dL   Albumin 2.4 (L) 3.5 - 5.0 g/dL   AST 38 15 - 41 U/L   ALT 28 0 - 44 U/L   Alkaline Phosphatase 79 38 - 126 U/L   Total Bilirubin 1.0 0.3 - 1.2 mg/dL   GFR, Estimated 16 (L) >60 mL/min    Comment: (NOTE) Calculated using the CKD-EPI Creatinine Equation (2021)    Anion gap 14 5 - 15    Comment: Performed at [facility], [address] [place], [place] [address]  CBC     Status: Abnormal   Collection Time: 09/09/22  5:29 AM  Result Value Ref Range   WBC 16.7 (H) 4.0 - 10.5 K/uL   RBC 3.76 (L) 3.87 - 5.11 MIL/uL   Hemoglobin 9.8 (L) 12.0 - 15.0 g/dL   HCT 11.9 (L) 14.7 - 82.9 %   MCV 83.8 80.0 - 100.0 fL   MCH 26.1 26.0 - 34.0 pg   MCHC 31.1 30.0 - 36.0 g/dL   RDW 56.2 13.0 - 86.5 %   Platelets 238 150 - 400 K/uL   nRBC 0.0 0.0 - 0.2 %    Comment: Performed at [facility], [address] [place], [place] [address]    CT ABDOMEN PELVIS WO CONTRAST  Result Date: 09/08/2022 CLINICAL DATA:  Abdominal pain.  Sacral wound. EXAM: CT ABDOMEN AND PELVIS WITHOUT CONTRAST TECHNIQUE: Multidetector CT imaging of the abdomen and pelvis was performed following the standard protocol without IV contrast. RADIATION DOSE REDUCTION: This exam was performed according to the departmental dose-optimization program which includes automated exposure control, adjustment of the mA and/or kV according to patient size and/or use of iterative reconstruction technique. COMPARISON:  June 11, 2019 FINDINGS: Lower chest: Bibasilar atelectasis. Hepatobiliary: No focal liver abnormality is seen. Status post cholecystectomy. No biliary dilatation. Pancreas: Unremarkable. No pancreatic ductal dilatation or surrounding inflammatory changes. Spleen: Normal in size without focal abnormality. Adrenals/Urinary Tract: Normal adrenal glands. Right upper pole renal cyst measuring 2.2 cm normal left kidney. Normal bladder. Stomach/Bowel: Stomach is within normal limits. Appendix appears normal. No evidence of bowel wall thickening, distention, or inflammatory changes. Vascular/Lymphatic: Aortic atherosclerosis. No enlarged abdominal or pelvic lymph nodes. Reproductive: Status post hysterectomy. No adnexal masses. Other: Right sacral wound extends from the skin surface to the outer surface of the gluteal musculature. Significant  gas collection is seen with surrounding fat stranding. The process does not seem to involve the rectum. Musculoskeletal: No acute or significant osseous findings. IMPRESSION: 1. Right sacral wound extends from the skin surface to the outer surface of the gluteal musculature. Significant gas collection is seen  with surrounding fat stranding. The process does not seem to involve the rectum. 2. Aortic atherosclerosis. Aortic Atherosclerosis (ICD10-I70.0). Electronically Signed   By: Ted Mcalpine M.D.   On: 09/08/2022 14:56   DG Chest Portable 1 View  Result Date: 09/08/2022 CLINICAL DATA:  Recent diagnosis of COVID with increasing weakness EXAM: PORTABLE CHEST 1 VIEW COMPARISON:  Chest radiograph dated 01/08/2022 FINDINGS: Normal lung volumes. No focal consolidations. Trace left pleural effusion. No pneumothorax. Enlarged cardiomediastinal silhouette. No acute osseous abnormality. IMPRESSION: 1. Trace left pleural effusion.  No focal consolidations. 2. Cardiomegaly. Electronically Signed   By: Agustin Cree M.D.   On: 09/08/2022 13:38    Review of Systems  Respiratory:  Positive for cough, shortness of breath and wheezing.   Cardiovascular:  Negative for chest pain.  Genitourinary:  Negative for vaginal discharge.  Musculoskeletal:  Positive for arthralgias, back pain, myalgias and neck pain.  Psychiatric/Behavioral: Negative.     Blood pressure (!) 152/57, pulse 90, temperature 98.4 F (36.9 C), temperature source Oral, resp. rate 18, height 5\' 6"  (1.676 m), weight 101.3 kg, SpO2 100%. Physical Exam Skin:         Comments: Difficult to visualize due to patient's intolerance of being on her side and extreme shortness of breath.  Appears to be a 4 x 5 cm open area over the coccyx.  It is quite deep and unstageable.  There is a foul smell.  There is no cellulitis or crepitance.     Assessment/Plan: Sacral decubiti unstageable but exam hindered by patient's noncompliance due to extreme shortness  of breath and pain  Recommend wound ostomy care nurse evaluation for wound care  Patient is in no condition for any surgical intervention this point in time.  As her COVID clears and her pulmonary status improves, she may benefit from debridement next week.  No acute indication for any intervention this weekend.  Will follow-up on Monday  Lotus Santillo A Willeen Novak 09/09/2022, 12:51 PM   Moderate complexity

## 2022-09-09 NOTE — Evaluation (Signed)
Occupational Therapy Evaluation Patient Details Name: [name] [name] MRN: [id number] DOB: 12-Dec-1932 Today's Date: 09/09/2022   History of Present Illness Pt is an 89yo female admitted for cellutilis due to sacral wound and AKI, found to be COVID+ with severe weakness. PMH: DM, HTN, hx of CVA, HLD, CKD3.   Clinical Impression   Pt from [facility], reports was having assist for ADLs and mobilizing at w/c level (has manual and power w/c). PTA was able to transfer to her chair with RW without assist. Pt currently needing min-max A for ADLs, mod A for bed mobility and mod A+2 for sit to stand transfer with RW. Pt able to stand x3 min for wound dressing change, unable to take steps. Pt with 2/4 DOE with bed mobility and standing, SpO2 90-92% on RA, RN in room and notified. Pt presenting with impairments listed below, will follow acutely. Patient will benefit from continued inpatient follow up therapy, <3 hours/day to maximize safety/ind with ADLs/functional mobility.      Recommendations for follow up therapy are one component of a multi-disciplinary discharge planning process, led by the attending physician.  Recommendations may be updated based on patient status, additional functional criteria and insurance authorization.   Assistance Recommended at Discharge Frequent or constant Supervision/Assistance  Patient can return home with the following Two people to help with walking and/or transfers;A lot of help with bathing/dressing/bathroom;Assistance with cooking/housework;Direct supervision/assist for medications management;Direct supervision/assist for financial management;Assist for transportation;Help with stairs or ramp for entrance    Functional Status Assessment  Patient has had a recent decline in their functional status and demonstrates the ability to make significant improvements in function in a reasonable and predictable amount of time.  Equipment Recommendations  Other (comment)  (defer)    Recommendations for Other Services PT consult     Precautions / Restrictions Precautions Precautions: Fall;Other (comment) (sacral wound, R sided deficits from prior CVA) Restrictions Weight Bearing Restrictions: Yes      Mobility Bed Mobility Overal bed mobility: Needs Assistance Bed Mobility: Supine to Sit, Sit to Supine     Supine to sit: Mod assist Sit to supine: Mod assist   General bed mobility comments: assist for trunk elevation and returning BLE"s into bed    Transfers Overall transfer level: Needs assistance Equipment used: Rolling walker (2 wheels) Transfers: Sit to/from Stand Sit to Stand: Mod assist, +2 physical assistance           General transfer comment: mod +2 to stand with RW, able to stand x3 min for sacral wound dressing change, unable to take steps      Balance Overall balance assessment: Needs assistance Sitting-balance support: Feet supported Sitting balance-Leahy Scale: Fair Sitting balance - Comments: reaches minimally outside BOS without LOB   Standing balance support: During functional activity, Reliant on assistive device for balance Standing balance-Leahy Scale: Poor Standing balance comment: reliant on external support                           ADL either performed or assessed with clinical judgement   ADL Overall ADL's : Needs assistance/impaired Eating/Feeding: Minimal assistance   Grooming: Minimal assistance   Upper Body Bathing: Moderate assistance   Lower Body Bathing: Maximal assistance   Upper Body Dressing : Moderate assistance   Lower Body Dressing: Maximal assistance   Toilet Transfer: Moderate assistance;+2 for physical assistance   Toileting- Clothing Manipulation and Hygiene: Maximal assistance  Functional mobility during ADLs: Moderate assistance;Rolling walker (2 wheels);+2 for physical assistance       Vision   Vision Assessment?: No apparent visual deficits      Perception Perception Perception Tested?: No   Praxis Praxis Praxis tested?: Not tested    Pertinent Vitals/Pain Pain Assessment Pain Assessment: PAINAD Pain Score: 6  Faces Pain Scale: Hurts even more Pain Location: bottom (wound area) Pain Descriptors / Indicators: Discomfort Pain Intervention(s): Limited activity within patient's tolerance, Monitored during session, Repositioned     Hand Dominance Right   Extremity/Trunk Assessment Upper Extremity Assessment Upper Extremity Assessment: Generalized weakness RUE Deficits / Details: hx of CVA, mild ataxic-like movements, decreased grasp, can only flex shoulder ~70* before needing active assist from LUE RUE Coordination: decreased fine motor;decreased gross motor   Lower Extremity Assessment Lower Extremity Assessment: Defer to PT evaluation   Cervical / Trunk Assessment Cervical / Trunk Assessment: Kyphotic   Communication Communication Communication: No difficulties   Cognition Arousal/Alertness: Awake/alert Behavior During Therapy: WFL for tasks assessed/performed Overall Cognitive Status: Within Functional Limits for tasks assessed                                       General Comments  SpO2 low 90's in RA, RN aware and in room    Exercises     Shoulder Instructions      Home Living Family/patient expects to be discharged to:: Assisted living                             Home Equipment: Grab bars - tub/shower;Grab bars - toilet;Shower seat;Other (comment) (higher toilet)   Additional Comments: [facility]      Prior Functioning/Environment Prior Level of Function : Independent/Modified Independent             Mobility Comments: wheelchair level; has transport chair, manual w/c and electric w/c, uses RW to transfer to w/c ADLs Comments: assist for all ADLs        OT Problem List: Decreased strength;Decreased range of motion;Decreased activity tolerance;Impaired  balance (sitting and/or standing);Decreased cognition;Decreased safety awareness;Cardiopulmonary status limiting activity;Impaired UE functional use      OT Treatment/Interventions: Self-care/ADL training;Therapeutic exercise;Energy conservation;DME and/or AE instruction;Therapeutic activities;Patient/family education;Balance training    OT Goals(Current goals can be found in the care plan section) Acute Rehab OT Goals Patient Stated Goal: none stated OT Goal Formulation: With patient Time For Goal Achievement: 09/23/22 Potential to Achieve Goals: Good ADL Goals Pt Will Perform Upper Body Dressing: with min assist;sitting Pt Will Perform Lower Body Dressing: with min assist;sit to/from stand;sitting/lateral leans Pt Will Transfer to Toilet: with min assist;squat pivot transfer;stand pivot transfer;bedside commode Additional ADL Goal #1: pt will perform bed mobility min A in prep for ADLs  OT Frequency: Min 1X/week    Co-evaluation              AM-PAC OT "6 Clicks" Daily Activity     Outcome Measure Help from another person eating meals?: A Little Help from another person taking care of personal grooming?: A Little Help from another person toileting, which includes using toliet, bedpan, or urinal?: A Lot Help from another person bathing (including washing, rinsing, drying)?: A Lot Help from another person to put on and taking off regular upper body clothing?: A Lot Help from another person to put on and taking  off regular lower body clothing?: A Lot 6 Click Score: 14   End of Session Equipment Utilized During Treatment: Gait belt;Rolling walker (2 wheels) Nurse Communication: Mobility status  Activity Tolerance: Patient tolerated treatment well Patient left: in bed;with call bell/phone within reach;with bed alarm set;with nursing/sitter in room;with family/visitor present  OT Visit Diagnosis: Unsteadiness on feet (R26.81);Other abnormalities of gait and mobility (R26.89);Muscle  weakness (generalized) (M62.81)                Time: 1610-9604 OT Time Calculation (min): 35 min Charges:  OT General Charges $OT Visit: 1 Visit OT Evaluation $OT Eval Moderate Complexity: 1 Mod OT Treatments $Self Care/Home Management : 8-22 mins  Carver Fila, OTD, OTR/L SecureChat Preferred Acute Rehab [phone]   Carver Fila Koonce 09/09/2022, 4:26 PM

## 2022-09-09 NOTE — Progress Notes (Signed)
RT was called to access [name] by nursing.  HR=111, RR=26, O2 sat=92% on RA.  Albuterol SVN given for expiratory wheezes.  BBS are very diminished in bases and may be positive for crackles/fluid. Pt. Is using accessory muscles to breath.  Started on 2L of O2 for WOB.  IV fluids running at 100. She had pitting edema in lower extremities.  RN notified and message sent to MD.

## 2022-09-09 NOTE — Progress Notes (Signed)
PROGRESS NOTE    [name] [name]  RUE:[id number] DOB: 06-06-1932 DOA: 09/08/2022 PCP: [name], MD   Brief Narrative:  89 y.o. female with medical history significant of hypertension, diabetes mellitus type 2, prior stroke, pancreatitis, hyperlipidemia, CKD stage IIIb, obesity, chronic back wound presented with worsening weakness, lethargy and back pain.  Patient was apparently diagnosed with COVID-19 on 09/06/2022 at her assisted living facility and was started on Paxlovid and was also started on Bactrim for diagnosis of cellulitis of the back.  On presentation, WBCs was 18.5, creatinine was 3.24.  CT of abdomen and pelvis without contrast showed right sacral wound extending from the skin surface to the outer surface of the gluteal musculature with significant gas collection.  She was started on IV fluids and antibiotics.  Assessment & Plan:   Cellulitis around right buttock deep tissue pressure injury: Present on admission -Patient has had low back/sacral wound for few months now.  Presented with cellulitis getting worse despite being on Bactrim for few days.  Imaging as above. -Currently on broad-spectrum antibiotics.  Follow wound care recommendations.  I will also consult general surgery to see if the patient needs any debridement. -Follow cultures  Leukocytosis -Improving.  Monitor  Acute kidney injury on chronic kidney disease stage IIIb -Possibly from above. Creatinine around 1.4-1.8.  Creatinine 3.04 on presentation.  Improving to 2.82.  Switch IV fluids to normal saline at 100 cc an hour.  Repeat a.m. labs. -Diuretics on hold  Recent diagnosis of COVID-19 -Diagnosed on 09/06/2022.  Continue isolation.  Given renal failure, will hold Paxlovid. -Continue supportive care.  Hyponatremia -Mild.  Monitor.  Continue IV fluids  Anemia of chronic disease -From chronic illnesses.  Hemoglobin stable.  Monitor intermittently.    Mildly elevated AST -Questionable cause.   Resolved  Hypertension -Monitor blood pressure.  Continue hydralazine and metoprolol.  Holding losartan, chlorthalidone and Lasix.  Diabetes mellitus type 2 with hyperglycemia A1c 9.8.  Continue CBGs with SSI and continue NovoLog with meals.  Farxiga on hold  Physical deconditioning -PT eval  Goals of care -Patient is status full code.  Discussed with the patient and daughter at bedside about the guarded prognosis.  They want to be full code for now.  Consult palliative care for goals of care discussion   DVT prophylaxis: Start heparin subcutaneous  code Status: Full Family Communication: Daughter at bedside Disposition Plan: Status is: Inpatient Remains inpatient appropriate because: Of severity of illness    Consultants: General surgery.  Consult palliative care  Procedures: None  Antimicrobials: Cefepime, Flagyl and vancomycin from 09/08/2022 onwards   Subjective: Patient seen and examined at bedside.  Feels slightly better.  Still feels weak.  Denies worsening cough or fever.  No agitation reported.  Objective: Vitals:   09/08/22 1939 09/08/22 2234 09/09/22 0438 09/09/22 0912  BP: (!) 153/52 (!) 149/52 (!) 138/53 (!) 152/57  Pulse: 88 81 78 90  Resp: 18 16 18    Temp: 98.8 F (37.1 C) 98.9 F (37.2 C) 98.4 F (36.9 C)   TempSrc: Oral Oral Oral   SpO2: 95% 99% 93% 100%  Weight:      Height:        Intake/Output Summary (Last 24 hours) at 09/09/2022 1133 Last data filed at 09/09/2022 0506 Gross per 24 hour  Intake 2082.33 ml  Output 100 ml  Net 1982.33 ml   Filed Weights   09/08/22 1222 09/08/22 1939  Weight: 99.8 kg 101.3 kg    Examination:  General exam:  Appears calm and comfortable.  Looks chronically ill and deconditioned.  On room air. Respiratory system: Bilateral decreased breath sounds at bases with scattered crackles Cardiovascular system: S1 & S2 heard, Rate controlled Gastrointestinal system: Abdomen is nondistended, soft and nontender. Normal  bowel sounds heard. Extremities: No cyanosis, clubbing; right lower extremity 2+ pitting edema (chronic as per the daughter at bedside) Central nervous system: Alert and oriented.  Slow to respond, poor historian.  No focal neurological deficits. Moving extremities Skin: Large erythematous area in the buttock region along with pressure ulcer with some tenderness Psychiatry: Flat affect.  Not agitated.     Data Reviewed: I have personally reviewed following labs and imaging studies  CBC: Recent Labs  Lab 09/08/22 1234 09/09/22 0529  WBC 18.5* 16.7*  NEUTROABS 15.6*  --   HGB 10.5* 9.8*  HCT 33.3* 31.5*  MCV 82.2 83.8  PLT 240 238   Basic Metabolic Panel: Recent Labs  Lab 09/08/22 1234 09/08/22 1605 09/09/22 0529  NA 130* 132* 131*  K 4.4 4.3 4.4  CL 95* 97* 96*  CO2 21* 22 21*  GLUCOSE 176* 206* 259*  BUN 51* 50* 55*  CREATININE 3.24* 2.90* 2.82*  CALCIUM 7.4* 7.4* 7.3*   GFR: Estimated Creatinine Clearance: 16.9 mL/min (A) (by C-G formula based on SCr of 2.82 mg/dL (H)). Liver Function Tests: Recent Labs  Lab 09/08/22 1234 09/09/22 0529  AST 53* 38  ALT 26 28  ALKPHOS 73 79  BILITOT 0.8 1.0  PROT 6.8 6.1*  ALBUMIN 2.8* 2.4*   No results for input(s): "LIPASE", "AMYLASE" in the last 168 hours. No results for input(s): "AMMONIA" in the last 168 hours. Coagulation Profile: No results for input(s): "INR", "PROTIME" in the last 168 hours. Cardiac Enzymes: No results for input(s): "CKTOTAL", "CKMB", "CKMBINDEX", "TROPONINI" in the last 168 hours. BNP (last 3 results) No results for input(s): "PROBNP" in the last 8760 hours. HbA1C: Recent Labs    09/08/22 1234  HGBA1C 9.8*   CBG: Recent Labs  Lab 09/08/22 1233 09/08/22 1743 09/08/22 2108  GLUCAP 154* 194* 245*   Lipid Profile: No results for input(s): "CHOL", "HDL", "LDLCALC", "TRIG", "CHOLHDL", "LDLDIRECT" in the last 72 hours. Thyroid Function Tests: No results for input(s): "TSH", "T4TOTAL",  "FREET4", "T3FREE", "THYROIDAB" in the last 72 hours. Anemia Panel: No results for input(s): "VITAMINB12", "FOLATE", "FERRITIN", "TIBC", "IRON", "RETICCTPCT" in the last 72 hours. Sepsis Labs: Recent Labs  Lab 09/08/22 1323  LATICACIDVEN 1.8    Recent Results (from the past 240 hour(s))  Culture, blood (routine x 2)     Status: None (Preliminary result)   Collection Time: 09/08/22  1:28 PM   Specimen: BLOOD  Result Value Ref Range Status   Specimen Description   Final    BLOOD BLOOD LEFT FOREARM Performed at [facility], [address] [place], [place] [address]    Special Requests   Final    BOTTLES DRAWN AEROBIC AND ANAEROBIC Blood Culture results may not be optimal due to an inadequate volume of blood received in culture bottles Performed at [facility], [address], [place], [place] [address]    Culture   Final    NO GROWTH < 24 HOURS Performed at [facility], [address] [place], [place] [address]    Report Status PENDING  Incomplete  Culture, blood (routine x 2)     Status: None (Preliminary result)   Collection Time: 09/08/22  1:59 PM   Specimen: BLOOD  Result Value Ref Range Status   Specimen Description   Final    BLOOD BLOOD LEFT HAND Performed at [facility], [address] [place], [place] [address]    Special Requests   Final    BOTTLES DRAWN AEROBIC ONLY Blood Culture results may not be optimal due to an inadequate volume of blood received in culture bottles Performed at [facility], [address], [place], [place] [address]    Culture   Final    NO GROWTH < 24 HOURS Performed at [facility], [address], [place], [place] [address]    Report Status PENDING  Incomplete  MRSA Next Gen by PCR, Nasal     Status: None   Collection Time: 09/08/22 11:13 PM   Specimen: Nasal Mucosa; Nasal Swab  Result Value Ref Range Status   MRSA by PCR Next Gen NOT  DETECTED NOT DETECTED Final    Comment: (NOTE) The GeneXpert MRSA Assay (FDA approved for NASAL specimens only), is one component of a comprehensive MRSA colonization surveillance program. It is not intended to diagnose MRSA infection nor to guide or monitor treatment for MRSA infections. Test performance is not FDA approved in patients less than 57 years old. Performed at [facility], [address] [place], [place] [address]          Radiology Studies: CT ABDOMEN PELVIS WO CONTRAST  Result Date: 09/08/2022 CLINICAL DATA:  Abdominal pain.  Sacral wound. EXAM: CT ABDOMEN AND PELVIS WITHOUT CONTRAST TECHNIQUE: Multidetector CT imaging of the abdomen and pelvis was performed following the standard protocol without IV contrast. RADIATION DOSE REDUCTION: This exam was performed according to the departmental dose-optimization program which includes automated exposure control, adjustment of the mA and/or kV according to patient size and/or use of iterative reconstruction technique. COMPARISON:  June 11, 2019 FINDINGS: Lower chest: Bibasilar atelectasis. Hepatobiliary: No focal liver abnormality is seen. Status post cholecystectomy. No biliary dilatation. Pancreas: Unremarkable. No pancreatic ductal dilatation or surrounding inflammatory changes. Spleen: Normal in size without focal abnormality. Adrenals/Urinary Tract: Normal adrenal glands. Right upper pole renal cyst measuring 2.2 cm normal left kidney. Normal bladder. Stomach/Bowel: Stomach is within normal limits. Appendix appears normal. No evidence of bowel wall thickening, distention, or inflammatory changes. Vascular/Lymphatic: Aortic atherosclerosis. No enlarged abdominal or pelvic lymph nodes. Reproductive: Status post hysterectomy. No adnexal masses. Other: Right sacral wound extends from the skin surface to the outer surface of the gluteal musculature. Significant gas collection is seen with surrounding fat stranding.  The process does not seem to involve the rectum. Musculoskeletal: No acute or significant osseous findings. IMPRESSION: 1. Right sacral wound extends from the skin surface to the outer surface of the gluteal musculature. Significant gas collection is seen with surrounding fat stranding. The process does not seem to involve the rectum. 2. Aortic atherosclerosis. Aortic Atherosclerosis (ICD10-I70.0). Electronically Signed   By: Ted Mcalpine M.D.   On: 09/08/2022 14:56   DG Chest Portable 1 View  Result Date: 09/08/2022 CLINICAL DATA:  Recent diagnosis of COVID with increasing weakness EXAM: PORTABLE CHEST 1 VIEW COMPARISON:  Chest radiograph dated 01/08/2022 FINDINGS: Normal lung volumes. No focal consolidations. Trace left pleural effusion. No pneumothorax. Enlarged cardiomediastinal silhouette. No acute osseous abnormality. IMPRESSION: 1. Trace left pleural effusion.  No focal consolidations. 2. Cardiomegaly. Electronically Signed   By: Agustin Cree M.D.   On: 09/08/2022 13:38        Scheduled Meds:  hydrALAZINE  100 mg  Oral TID   insulin aspart  0-5 Units Subcutaneous QHS   insulin aspart  0-9 Units Subcutaneous TID WC   insulin aspart  3 Units Subcutaneous TID WC   metoprolol tartrate  100 mg Oral BID   Continuous Infusions:  sodium chloride 100 mL/hr at 09/09/22 0902   ceFEPime (MAXIPIME) IV     metronidazole 500 mg (09/09/22 0907)   [START ON 09/11/2022] vancomycin            Glade Lloyd, MD [facility] 09/09/2022, 11:33 AM

## 2022-09-09 NOTE — Progress Notes (Signed)
PT Cancellation Note  Patient Details Name: [name] [name] MRN: [id number] DOB: 31-Aug-1932   Cancelled Treatment:    Reason Eval/Treat Not Completed: (P) Fatigue/lethargy limiting ability to participate. Checked on pt 3x, pt refusing citing fatigue and "I've done a lot already today, the doctors' turned me over and the occupational therapy just finished; I'll do it tomorrow." Informed patient of the reduced therapy staff on weekends but pt adamant. We will follow up as schedule and pt status allows which will be another day.   Jamesetta Geralds, PT, DPT [facility] Department Office: [phone] Lakeeshia Carley 09/09/2022, 2:33 PM

## 2022-09-09 NOTE — Progress Notes (Signed)
I was asked to evaluate the patient by RT because of tachypnea, use of accessory muscles and wheezing.  Before I evaluated the patient, I have already stopped IV fluids and ordered chest x-ray.  I have also ordered ABG. I evaluated the patient at bedside.  She is awake and conversant but slightly tachypneic with respiratory rate in the 20s otherwise she is nontachycardic and blood pressure remains stable.  She has scattered crackles and wheezing on respiratory exam.  She is not complaining of chest pain.  She already has received albuterol nebulizer at 3:52 PM.  I will put her on scheduled DuoNebs as well.  I will order a dose of IV Lasix and IV Solu-Medrol.  Currently on 2 L oxygen by nasal cannula.  Follow chest x-ray and ABG results.  Plan of care discussed with the patient, daughter at bedside and RT.

## 2022-09-09 NOTE — Progress Notes (Signed)
PHARMACY - PHYSICIAN COMMUNICATION CRITICAL VALUE ALERT - BLOOD CULTURE IDENTIFICATION (BCID)  [name] [name] is an 89 y.o. female who presented to [facility] on 09/08/2022 with a chief complaint of weakness, lethargy  Assessment:  1/4 BCx bottles growing MRSE (suspect chronic decubitus ulcers as source)  Name of physician (or Provider) ContactedHanley Ben  Current antibiotics: Vanc/Cefepime/Flagyl  Changes to prescribed antibiotics recommended: Most likely contam so would not narrow, but if real would be covered by vanc (would need to r/o infective endocarditis in this scenario) Patient is on recommended antibiotics - No changes needed  Results for orders placed or performed during the hospital encounter of 09/08/22  Blood Culture ID Panel (Reflexed) (Collected: 09/08/2022  1:28 PM)  Result Value Ref Range   Enterococcus faecalis NOT DETECTED NOT DETECTED   Enterococcus Faecium NOT DETECTED NOT DETECTED   Listeria monocytogenes NOT DETECTED NOT DETECTED   Staphylococcus species DETECTED (A) NOT DETECTED   Staphylococcus aureus (BCID) NOT DETECTED NOT DETECTED   Staphylococcus epidermidis DETECTED (A) NOT DETECTED   Staphylococcus lugdunensis NOT DETECTED NOT DETECTED   Streptococcus species NOT DETECTED NOT DETECTED   Streptococcus agalactiae NOT DETECTED NOT DETECTED   Streptococcus pneumoniae NOT DETECTED NOT DETECTED   Streptococcus pyogenes NOT DETECTED NOT DETECTED   A.calcoaceticus-baumannii NOT DETECTED NOT DETECTED   Bacteroides fragilis NOT DETECTED NOT DETECTED   Enterobacterales NOT DETECTED NOT DETECTED   Enterobacter cloacae complex NOT DETECTED NOT DETECTED   Escherichia coli NOT DETECTED NOT DETECTED   Klebsiella aerogenes NOT DETECTED NOT DETECTED   Klebsiella oxytoca NOT DETECTED NOT DETECTED   Klebsiella pneumoniae NOT DETECTED NOT DETECTED   Proteus species NOT DETECTED NOT DETECTED   Salmonella species NOT DETECTED NOT DETECTED   Serratia marcescens NOT  DETECTED NOT DETECTED   Haemophilus influenzae NOT DETECTED NOT DETECTED   Neisseria meningitidis NOT DETECTED NOT DETECTED   Pseudomonas aeruginosa NOT DETECTED NOT DETECTED   Stenotrophomonas maltophilia NOT DETECTED NOT DETECTED   Candida albicans NOT DETECTED NOT DETECTED   Candida auris NOT DETECTED NOT DETECTED   Candida glabrata NOT DETECTED NOT DETECTED   Candida krusei NOT DETECTED NOT DETECTED   Candida parapsilosis NOT DETECTED NOT DETECTED   Candida tropicalis NOT DETECTED NOT DETECTED   Cryptococcus neoformans/gattii NOT DETECTED NOT DETECTED   Methicillin resistance mecA/C DETECTED (A) NOT DETECTED    [name] A 09/09/2022  5:55 PM

## 2022-09-09 NOTE — Consult Note (Signed)
WOC Nurse Consult Note: Reason for Consult:right buttock DTPI. See also photo provided by Admitting provider. Wound type:pressure Pressure Injury POA: Yes Measurement:To be obtained by Bedside RN and documented on Nursing Flow Sheet with application of next dressing today Wound ZDG:LOVF purple maroon discoloration and sloughing of epidermis Drainage (amount, consistency, odor) small serous Periwound: ecchymosis  Dressing procedure/placement/frequency:I have provided a mattress replacement and Prevalon boots for pressure redistribution and microclimate management. Turning and repositioning is in place. Topical care will with to cleanse daily and cover with an antimicrobial and nonadherent gauze (xeroform) covered with dry gauze and secured with a silicone foam placed with the "tip" oriented away from the anus.  WOC nursing team will not follow, but will remain available to this patient, the nursing and medical teams.  Please re-consult if needed.  Thank you for inviting [place] to participate in this patient's Plan of Care.  Ladona Mow, MSN, RN, CNS, GNP, Leda Min, [employer], [employer] phone:  [phone]

## 2022-09-09 NOTE — Progress Notes (Signed)
Pharmacy Antibiotic Note  [name] [name] is a 89 y.o. female admitted on 09/08/2022 with  medical history significant of hypertension, diabetes mellitus type 2, prior stroke, pancreatitis, hyperlipidemia, CKD stage III, and morbid obesity presents to the ER from ALF due to severe weakness and lethargy. Pt with severe cellulitis from possible sacral ulcer Pharmacy has been consulted for vancomycin and cefepime.  Plan: Vancomycin 2gm IV x 1 then 750mg  q48h (AUC 492.9, Scr 2.9) Cefepime 2gm IV q24h Follow renal function and clinical course  Height: 5\' 6"  (167.6 cm) Weight: 101.3 kg (223 lb 5.2 oz) IBW/kg (Calculated) : 59.3  Temp (24hrs), Avg:98.9 F (37.2 C), Min:98.7 F (37.1 C), Max:99.3 F (37.4 C)  Recent Labs  Lab 09/08/22 1234 09/08/22 1323 09/08/22 1605  WBC 18.5*  --   --   CREATININE 3.24*  --  2.90*  LATICACIDVEN  --  1.8  --     Estimated Creatinine Clearance: 16.4 mL/min (A) (by C-G formula based on SCr of 2.9 mg/dL (H)).    Allergies  Allergen Reactions   Amlodipine Other (See Comments)    EDEMA and "Allergic," per MAR   Ace Inhibitors Other (See Comments) and Cough    "Allergic," per MAR   Atorvastatin Other (See Comments)    Muscle & joint aches and "Allergic," per MAR   Canagliflozin Other (See Comments)    INVOKANA = urinary tract infections and "Allergic," per MAR   Clonidine Hcl Other (See Comments)    "Allergic," per MAR   Buprenorphine Other (See Comments)    "Allergic," per MAR   Lisinopril Other (See Comments)    "Allergic," per MAR   Morphine And Codeine Rash and Other (See Comments)    For long periods of time (??) and "Allergic," per MAR    Antimicrobials this admission: 7/12 zosyn x 1 7/13 vanc >> 7/13 cefepime >>  Dose adjustments this admission:   Microbiology results: 7/12 BCx:  7/12 MRSA PCR:   Thank you for allowing pharmacy to be a part of this patient's care.  Arley Phenix RPh 09/09/2022, 12:09 AM

## 2022-09-10 DIAGNOSIS — L03312 Cellulitis of back [any part except buttock]: Secondary | ICD-10-CM | POA: Diagnosis not present

## 2022-09-10 DIAGNOSIS — Z7189 Other specified counseling: Secondary | ICD-10-CM

## 2022-09-10 DIAGNOSIS — Z515 Encounter for palliative care: Secondary | ICD-10-CM | POA: Diagnosis not present

## 2022-09-10 DIAGNOSIS — R531 Weakness: Secondary | ICD-10-CM

## 2022-09-10 LAB — COMPREHENSIVE METABOLIC PANEL
ALT: 25 U/L (ref 0–44)
AST: 30 U/L (ref 15–41)
Albumin: 2.4 g/dL — ABNORMAL LOW (ref 3.5–5.0)
Alkaline Phosphatase: 81 U/L (ref 38–126)
Anion gap: 16 — ABNORMAL HIGH (ref 5–15)
BUN: 68 mg/dL — ABNORMAL HIGH (ref 8–23)
CO2: 19 mmol/L — ABNORMAL LOW (ref 22–32)
Calcium: 7.7 mg/dL — ABNORMAL LOW (ref 8.9–10.3)
Chloride: 98 mmol/L (ref 98–111)
Creatinine, Ser: 2.98 mg/dL — ABNORMAL HIGH (ref 0.44–1.00)
GFR, Estimated: 15 mL/min — ABNORMAL LOW (ref >60)
Glucose, Bld: 375 mg/dL — ABNORMAL HIGH (ref 70–99)
Potassium: 4.8 mmol/L (ref 3.5–5.1)
Sodium: 133 mmol/L — ABNORMAL LOW (ref 135–145)
Total Bilirubin: 0.8 mg/dL (ref 0.3–1.2)
Total Protein: 6.1 g/dL — ABNORMAL LOW (ref 6.5–8.1)

## 2022-09-10 LAB — CBC WITH DIFFERENTIAL/PLATELET
Abs Immature Granulocytes: 0.11 10*3/uL — ABNORMAL HIGH (ref 0.00–0.07)
Basophils Absolute: 0 10*3/uL (ref 0.0–0.1)
Basophils Relative: 0 %
Eosinophils Absolute: 0 10*3/uL (ref 0.0–0.5)
Eosinophils Relative: 0 %
HCT: 30.2 % — ABNORMAL LOW (ref 36.0–46.0)
Hemoglobin: 9.5 g/dL — ABNORMAL LOW (ref 12.0–15.0)
Immature Granulocytes: 1 %
Lymphocytes Relative: 4 %
Lymphs Abs: 0.5 10*3/uL — ABNORMAL LOW (ref 0.7–4.0)
MCH: 25.9 pg — ABNORMAL LOW (ref 26.0–34.0)
MCHC: 31.5 g/dL (ref 30.0–36.0)
MCV: 82.3 fL (ref 80.0–100.0)
Monocytes Absolute: 0.3 10*3/uL (ref 0.1–1.0)
Monocytes Relative: 2 %
Neutro Abs: 13.6 10*3/uL — ABNORMAL HIGH (ref 1.7–7.7)
Neutrophils Relative %: 93 %
Platelets: 261 10*3/uL (ref 150–400)
RBC: 3.67 MIL/uL — ABNORMAL LOW (ref 3.87–5.11)
RDW: 15.6 % — ABNORMAL HIGH (ref 11.5–15.5)
WBC: 14.6 10*3/uL — ABNORMAL HIGH (ref 4.0–10.5)
nRBC: 0 % (ref 0.0–0.2)

## 2022-09-10 LAB — C-REACTIVE PROTEIN: CRP: 27 mg/dL — ABNORMAL HIGH (ref <1.0)

## 2022-09-10 LAB — GLUCOSE, CAPILLARY
Glucose-Capillary: 382 mg/dL — ABNORMAL HIGH (ref 70–99)
Glucose-Capillary: 435 mg/dL — ABNORMAL HIGH (ref 70–99)
Glucose-Capillary: 399 mg/dL — ABNORMAL HIGH (ref 70–99)

## 2022-09-10 LAB — MAGNESIUM: Magnesium: 2.1 mg/dL (ref 1.7–2.4)

## 2022-09-10 LAB — BLOOD GAS, ARTERIAL
Acid-base deficit: 0.3 mmol/L (ref 0.0–2.0)
Bicarbonate: 24.1 mmol/L (ref 20.0–28.0)
Patient temperature: 36.6

## 2022-09-10 MED ORDER — INSULIN GLARGINE-YFGN 100 UNIT/ML ~~LOC~~ SOLN
25.0000 [IU] | Freq: Two times a day (BID) | SUBCUTANEOUS | Status: DC
  Administered 2022-09-10 (×2): 25 [IU] via SUBCUTANEOUS
  Filled 2022-09-10 (×3): qty 0.25

## 2022-09-10 MED ORDER — MELATONIN 3 MG PO TABS
3.0000 mg | ORAL_TABLET | Freq: Once | ORAL | Status: AC
  Administered 2022-09-10: 3 mg via ORAL
  Filled 2022-09-10: qty 1

## 2022-09-10 MED ORDER — METHYLPREDNISOLONE SODIUM SUCC 125 MG IJ SOLR
80.0000 mg | Freq: Once | INTRAMUSCULAR | Status: AC
  Administered 2022-09-10: 80 mg via INTRAVENOUS
  Filled 2022-09-10: qty 2

## 2022-09-10 MED ORDER — QUETIAPINE FUMARATE 25 MG PO TABS
12.5000 mg | ORAL_TABLET | Freq: Every evening | ORAL | Status: DC | PRN
  Administered 2022-09-11: 12.5 mg via ORAL
  Filled 2022-09-10 (×3): qty 1

## 2022-09-10 MED ORDER — INSULIN ASPART 100 UNIT/ML IJ SOLN
15.0000 [IU] | Freq: Three times a day (TID) | INTRAMUSCULAR | Status: DC
  Administered 2022-09-10 (×3): 15 [IU] via SUBCUTANEOUS

## 2022-09-10 MED ORDER — FUROSEMIDE 10 MG/ML IJ SOLN
60.0000 mg | Freq: Once | INTRAMUSCULAR | Status: AC
  Administered 2022-09-10: 60 mg via INTRAVENOUS
  Filled 2022-09-10: qty 6

## 2022-09-10 NOTE — Progress Notes (Signed)
PROGRESS NOTE    [name] [name]  BJY:[id number] DOB: 30-Jan-1933 DOA: 09/08/2022 PCP: [name], MD   Brief Narrative:  89 y.o. female with medical history significant of hypertension, diabetes mellitus type 2, prior stroke, pancreatitis, hyperlipidemia, CKD stage IIIb, obesity, chronic back wound presented with worsening weakness, lethargy and back pain.  Patient was apparently diagnosed with COVID-19 on 09/06/2022 at her assisted living facility and was started on Paxlovid and was also started on Bactrim for diagnosis of cellulitis of the back.  On presentation, WBCs was 18.5, creatinine was 3.24.  CT of abdomen and pelvis without contrast showed right sacral wound extending from the skin surface to the outer surface of the gluteal musculature with significant gas collection.  She was started on IV fluids and antibiotics.  General surgery was consulted.  Palliative care also consulted for goals of care discussion.  Assessment & Plan:   Cellulitis around right buttock deep tissue pressure injury: Present on admission Methicillin-resistant coagulase-negative Staphylococcus bacteremia -Patient has had low back/sacral wound for few months now.  Presented with cellulitis getting worse despite being on Bactrim for few days.  Imaging as above. -Currently on broad-spectrum antibiotics.  Follow wound care recommendations.  General surgery following.  No need for any acute intervention this weekend.  Might need debridement next week. -BCID showing methicillin-resistant coagulase-negative Staphylococcus.  Unclear if this is contaminant or is clinically significant since patient has injected decubitus ulcer.  Follow repeat cultures from today.  If has persistent bacteremia, might have to pursue 2D echo.  Continue current antibiotics  Leukocytosis -Improving slightly.  Monitor  Acute kidney injury on chronic kidney disease stage IIIb Possible volume overload -Possibly from above. Creatinine around  1.4-1.8.  Creatinine 3.04 on presentation.  Improving to 2.98 -Patient was on IV fluids on 09/09/2022 but had to be stopped because of worsening tachypnea and patient was given a dose of IV Lasix.  Lower extremity swelling improving with IV Lasix.  Will give another dose of IV Lasix today. If no improvement in kidney function, will need nephrology evaluation  Recent diagnosis of COVID-19 -Diagnosed on 09/06/2022.  Continue isolation.  Given renal failure, will hold Paxlovid. -Continue supportive care. -CRP elevated at 27 possibly from bladder infection and COVID.  Monitor  Hyponatremia -Mild.  Monitor.  IV fluids plan as above.  Anemia of chronic disease -From chronic illnesses.  Hemoglobin stable.  Monitor intermittently.    Mildly elevated AST -Questionable cause.  Resolved  Hypertension -Monitor blood pressure.  Continue hydralazine and metoprolol.  Holding losartan, chlorthalidone and Lasix.    Diabetes mellitus type 2 with hyperglycemia A1c 9.8.  Continue CBGs with SSI and continue NovoLog with meals.  Blood sugars elevated.  Increase long-acting insulin.  [name] on hold  Physical deconditioning -PT eval  Goals of care -Patient is full code.  Discussed with the patient and daughter at bedside about the guarded prognosis on 09/09/2022.  They want to be full code for now.  palliative care discussion pending  DVT prophylaxis: heparin subcutaneous  code Status: Full Family Communication: Daughter at bedside Disposition Plan: Status is: Inpatient Remains inpatient appropriate because: Of severity of illness    Consultants: General surgery.  palliative care  Procedures: None  Antimicrobials: Cefepime, Flagyl and vancomycin from 09/08/2022 onwards   Subjective: Patient seen and examined at bedside.  Feels slightly better but still feels intermittently short of breath with cough.  Feels weak.  Complains of back pain.  No fever or vomiting reported  Objective: Vitals:  09/09/22 1552 09/09/22 2006 09/09/22 2106 09/10/22 0449  BP:   (!) 135/53 (!) 136/57  Pulse:   79 67  Resp:   20 18  Temp:   98.2 F (36.8 C) 98.1 F (36.7 C)  TempSrc:   Oral Oral  SpO2: 92% 93% 96% 98%  Weight:      Height:        Intake/Output Summary (Last 24 hours) at 09/10/2022 0742 Last data filed at 09/10/2022 0600 Gross per 24 hour  Intake 1136.28 ml  Output 1100 ml  Net 36.28 ml   Filed Weights   09/08/22 1222 09/08/22 1939  Weight: 99.8 kg 101.3 kg    Examination:  General: On 2 L oxygen via nasal cannula.  No distress.  Looks chronically ill and deconditioned. ENT/neck: No thyromegaly.  JVD is not elevated  respiratory: Decreased breath sounds at bases bilaterally with some crackles; no wheezing  CVS: S1-S2 heard, rate controlled currently Abdominal: Soft, nontender, slightly distended; no organomegaly, normal bowel sounds are heard Extremities: 2+ right lower extremity edema present; no cyanosis  CNS: Awake and alert.  Slow to respond.  Poor historian.  No focal neurologic deficit.  Moves extremities Lymph: No obvious lymphadenopathy Skin: No obvious ecchymosis/lesions  psych: Mostly flat affect.  Currently not agitated.   Musculoskeletal: No obvious joint swelling/deformity     Data Reviewed: I have personally reviewed following labs and imaging studies  CBC: Recent Labs  Lab 09/08/22 1234 09/09/22 0529 09/10/22 0528  WBC 18.5* 16.7* 14.6*  NEUTROABS 15.6*  --  13.6*  HGB 10.5* 9.8* 9.5*  HCT 33.3* 31.5* 30.2*  MCV 82.2 83.8 82.3  PLT 240 238 261   Basic Metabolic Panel: Recent Labs  Lab 09/08/22 1234 09/08/22 1605 09/09/22 0529 09/10/22 0528  NA 130* 132* 131* 133*  K 4.4 4.3 4.4 4.8  CL 95* 97* 96* 98  CO2 21* 22 21* 19*  GLUCOSE 176* 206* 259* 375*  BUN 51* 50* 55* 68*  CREATININE 3.24* 2.90* 2.82* 2.98*  CALCIUM 7.4* 7.4* 7.3* 7.7*  MG  --   --   --  2.1   GFR: Estimated Creatinine Clearance: 16 mL/min (A) (by C-G formula  based on SCr of 2.98 mg/dL (H)). Liver Function Tests: Recent Labs  Lab 09/08/22 1234 09/09/22 0529 09/10/22 0528  AST 53* 38 30  ALT 26 28 25   ALKPHOS 73 79 81  BILITOT 0.8 1.0 0.8  PROT 6.8 6.1* 6.1*  ALBUMIN 2.8* 2.4* 2.4*   No results for input(s): "LIPASE", "AMYLASE" in the last 168 hours. No results for input(s): "AMMONIA" in the last 168 hours. Coagulation Profile: No results for input(s): "INR", "PROTIME" in the last 168 hours. Cardiac Enzymes: No results for input(s): "CKTOTAL", "CKMB", "CKMBINDEX", "TROPONINI" in the last 168 hours. BNP (last 3 results) No results for input(s): "PROBNP" in the last 8760 hours. HbA1C: Recent Labs    09/08/22 1234  HGBA1C 9.8*   CBG: Recent Labs  Lab 09/08/22 1233 09/08/22 1743 09/08/22 2108 09/09/22 1637 09/09/22 2102  GLUCAP 154* 194* 245* 270* 268*   Lipid Profile: No results for input(s): "CHOL", "HDL", "LDLCALC", "TRIG", "CHOLHDL", "LDLDIRECT" in the last 72 hours. Thyroid Function Tests: No results for input(s): "TSH", "T4TOTAL", "FREET4", "T3FREE", "THYROIDAB" in the last 72 hours. Anemia Panel: No results for input(s): "VITAMINB12", "FOLATE", "FERRITIN", "TIBC", "IRON", "RETICCTPCT" in the last 72 hours. Sepsis Labs: Recent Labs  Lab 09/08/22 1323  LATICACIDVEN 1.8    Recent Results (from the past 240  hour(s))  Culture, blood (routine x 2)     Status: None (Preliminary result)   Collection Time: 09/08/22  1:28 PM   Specimen: BLOOD  Result Value Ref Range Status   Specimen Description   Final    BLOOD BLOOD LEFT FOREARM Performed at [facility], [address] [place], [place] [address]    Special Requests   Final    BOTTLES DRAWN AEROBIC AND ANAEROBIC Blood Culture results may not be optimal due to an inadequate volume of blood received in culture bottles Performed at [facility], [address], [place], [place] [address]    Culture  Setup Time   Final    GRAM  POSITIVE COCCI AEROBIC BOTTLE ONLY CRITICAL RESULT CALLED TO, READ BACK BY AND VERIFIED WITH: Nathanial Millman [facility] [id number] 1730 BY Berline Chough, MT Performed at [facility], [address], [place], [place] [address]    Culture GRAM POSITIVE COCCI  Final   Report Status PENDING  Incomplete  Blood Culture ID Panel (Reflexed)     Status: Abnormal   Collection Time: 09/08/22  1:28 PM  Result Value Ref Range Status   Enterococcus faecalis NOT DETECTED NOT DETECTED Final   Enterococcus Faecium NOT DETECTED NOT DETECTED Final   Listeria monocytogenes NOT DETECTED NOT DETECTED Final   Staphylococcus species DETECTED (A) NOT DETECTED Final    Comment: CRITICAL RESULT CALLED TO, READ BACK BY AND VERIFIED WITH: PHARMD DREW WOFFORD [id number] 1730 BY J RAZZAK, MT    Staphylococcus aureus (BCID) NOT DETECTED NOT DETECTED Final   Staphylococcus epidermidis DETECTED (A) NOT DETECTED Final    Comment: Methicillin (oxacillin) resistant coagulase negative staphylococcus. Possible blood culture contaminant (unless isolated from more than one blood culture draw or clinical case suggests pathogenicity). No antibiotic treatment is indicated for blood  culture contaminants. CRITICAL RESULT CALLED TO, READ BACK BY AND VERIFIED WITH: PHARMD DREW WOFFORD [id number] 1730 BY J RAZZAK, MT    Staphylococcus lugdunensis NOT DETECTED NOT DETECTED Final   Streptococcus species NOT DETECTED NOT DETECTED Final   Streptococcus agalactiae NOT DETECTED NOT DETECTED Final   Streptococcus pneumoniae NOT DETECTED NOT DETECTED Final   Streptococcus pyogenes NOT DETECTED NOT DETECTED Final   A.calcoaceticus-baumannii NOT DETECTED NOT DETECTED Final   Bacteroides fragilis NOT DETECTED NOT DETECTED Final   Enterobacterales NOT DETECTED NOT DETECTED Final   Enterobacter cloacae complex NOT DETECTED NOT DETECTED Final   Escherichia coli NOT DETECTED NOT DETECTED Final   Klebsiella aerogenes NOT DETECTED NOT DETECTED Final    Klebsiella oxytoca NOT DETECTED NOT DETECTED Final   Klebsiella pneumoniae NOT DETECTED NOT DETECTED Final   Proteus species NOT DETECTED NOT DETECTED Final   Salmonella species NOT DETECTED NOT DETECTED Final   Serratia marcescens NOT DETECTED NOT DETECTED Final   Haemophilus influenzae NOT DETECTED NOT DETECTED Final   Neisseria meningitidis NOT DETECTED NOT DETECTED Final   Pseudomonas aeruginosa NOT DETECTED NOT DETECTED Final   Stenotrophomonas maltophilia NOT DETECTED NOT DETECTED Final   Candida albicans NOT DETECTED NOT DETECTED Final   Candida auris NOT DETECTED NOT DETECTED Final   Candida glabrata NOT DETECTED NOT DETECTED Final   Candida krusei NOT DETECTED NOT DETECTED Final   Candida parapsilosis NOT DETECTED NOT DETECTED Final   Candida tropicalis NOT DETECTED NOT DETECTED Final   Cryptococcus neoformans/gattii NOT DETECTED NOT DETECTED Final   Methicillin resistance mecA/C DETECTED (A) NOT DETECTED Final    Comment: CRITICAL RESULT CALLED TO, READ  BACK BY AND VERIFIED WITH: Nathanial Millman [facility] [id number] 1730 BY Berline Chough, MT Performed at [facility], [address], [place], [place] [address]   Culture, blood (routine x 2)     Status: None (Preliminary result)   Collection Time: 09/08/22  1:59 PM   Specimen: BLOOD  Result Value Ref Range Status   Specimen Description   Final    BLOOD BLOOD LEFT HAND Performed at [facility], [address], [place], [place] [address]    Special Requests   Final    BOTTLES DRAWN AEROBIC ONLY Blood Culture results may not be optimal due to an inadequate volume of blood received in culture bottles Performed at [facility], [address] [place], [place] [address]    Culture   Final    NO GROWTH < 24 HOURS Performed at [facility], [address] [place], [place] [address]    Report Status PENDING  Incomplete  MRSA Next Gen by PCR, Nasal     Status: None   Collection Time:  09/08/22 11:13 PM   Specimen: Nasal Mucosa; Nasal Swab  Result Value Ref Range Status   MRSA by PCR Next Gen NOT DETECTED NOT DETECTED Final    Comment: (NOTE) The GeneXpert MRSA Assay (FDA approved for NASAL specimens only), is one component of a comprehensive MRSA colonization surveillance program. It is not intended to diagnose MRSA infection nor to guide or monitor treatment for MRSA infections. Test performance is not FDA approved in patients less than 29 years old. Performed at [facility], [address], [place], [place] [address]          Radiology Studies: DG CHEST PORT 1 VIEW  Result Date: 09/09/2022 CLINICAL DATA:  Dyspnea.  Back pain.  Weakness. EXAM: PORTABLE CHEST 1 VIEW COMPARISON:  09/08/2022 FINDINGS: Atherosclerotic calcification of the aortic arch. Heart size within normal limits for AP projection. Mild atelectasis laterally at the left lung base. Otherwise unremarkable. IMPRESSION: 1. Mild atelectasis laterally at the left lung base. 2.  Aortic Atherosclerosis (ICD10-I70.0). Electronically Signed   By: Gaylyn Rong M.D.   On: 09/09/2022 17:21   CT ABDOMEN PELVIS WO CONTRAST  Result Date: 09/08/2022 CLINICAL DATA:  Abdominal pain.  Sacral wound. EXAM: CT ABDOMEN AND PELVIS WITHOUT CONTRAST TECHNIQUE: Multidetector CT imaging of the abdomen and pelvis was performed following the standard protocol without IV contrast. RADIATION DOSE REDUCTION: This exam was performed according to the departmental dose-optimization program which includes automated exposure control, adjustment of the mA and/or kV according to patient size and/or use of iterative reconstruction technique. COMPARISON:  June 11, 2019 FINDINGS: Lower chest: Bibasilar atelectasis. Hepatobiliary: No focal liver abnormality is seen. Status post cholecystectomy. No biliary dilatation. Pancreas: Unremarkable. No pancreatic ductal dilatation or surrounding inflammatory changes. Spleen:  Normal in size without focal abnormality. Adrenals/Urinary Tract: Normal adrenal glands. Right upper pole renal cyst measuring 2.2 cm normal left kidney. Normal bladder. Stomach/Bowel: Stomach is within normal limits. Appendix appears normal. No evidence of bowel wall thickening, distention, or inflammatory changes. Vascular/Lymphatic: Aortic atherosclerosis. No enlarged abdominal or pelvic lymph nodes. Reproductive: Status post hysterectomy. No adnexal masses. Other: Right sacral wound extends from the skin surface to the outer surface of the gluteal musculature. Significant gas collection is seen with surrounding fat stranding. The process does not seem to involve the rectum. Musculoskeletal: No acute or significant osseous findings. IMPRESSION: 1. Right sacral wound extends from the skin surface to  the outer surface of the gluteal musculature. Significant gas collection is seen with surrounding fat stranding. The process does not seem to involve the rectum. 2. Aortic atherosclerosis. Aortic Atherosclerosis (ICD10-I70.0). Electronically Signed   By: Ted Mcalpine M.D.   On: 09/08/2022 14:56   DG Chest Portable 1 View  Result Date: 09/08/2022 CLINICAL DATA:  Recent diagnosis of COVID with increasing weakness EXAM: PORTABLE CHEST 1 VIEW COMPARISON:  Chest radiograph dated 01/08/2022 FINDINGS: Normal lung volumes. No focal consolidations. Trace left pleural effusion. No pneumothorax. Enlarged cardiomediastinal silhouette. No acute osseous abnormality. IMPRESSION: 1. Trace left pleural effusion.  No focal consolidations. 2. Cardiomegaly. Electronically Signed   By: Agustin Cree M.D.   On: 09/08/2022 13:38        Scheduled Meds:  heparin injection (subcutaneous)  5,000 Units Subcutaneous Q8H   hydrALAZINE  100 mg Oral TID   insulin aspart  0-20 Units Subcutaneous TID WC   insulin aspart  0-5 Units Subcutaneous QHS   insulin aspart  10 Units Subcutaneous TID WC   insulin glargine-yfgn  25 Units  Subcutaneous QHS   ipratropium-albuterol  3 mL Nebulization TID   metoprolol tartrate  100 mg Oral BID   Continuous Infusions:  ceFEPime (MAXIPIME) IV 2 g (09/09/22 1214)   metronidazole 500 mg (09/10/22 0033)   [START ON 09/11/2022] vancomycin            Glade Lloyd, MD [facility] 09/10/2022, 7:42 AM

## 2022-09-10 NOTE — Evaluation (Signed)
Physical Therapy Evaluation Patient Details Name: [name] [name] MRN: [id number] DOB: 06-21-1932 Today's Date: 09/10/2022  History of Present Illness  Pt is an 89yo female admitted for cellutilis due to sacral wound and AKI, found to be COVID+ with severe weakness. PMH: DM, HTN, hx of CVA, HLD, CKD3.  CT abdomen and pelvis with right sacral wound extending from skin surface to the outer surface of gluteal musculature with significant gas collection.  General surgery also consulted and following.  Patient placed on IV fluids and antibiotics.  Wound care also following.  Patient being considered for debridement next week.  Clinical Impression  Pt admitted with above diagnosis.  Pt currently with functional limitations due to the deficits listed below (see PT Problem List). Pt will benefit from acute skilled PT to increase their independence and safety with mobility to allow discharge.  Pt assisted with OOB to recliner.  Pt typically able to transfer to/from w/c at baseline with RW.  Pt reports her w/c has a cushion however power w/c does not.  Pt presents with right buttock wound per notes so educated pt on offloading (pillows positioned in recliner offload right side) as well as repositioning often.  Pt reports she needs more assist then ALF is currently providing and would like to d/c to SNF.  Patient will benefit from continued inpatient follow up therapy, <3 hours/day.          Assistance Recommended at Discharge Intermittent Supervision/Assistance  If plan is discharge home, recommend the following:  Can travel by private vehicle  A lot of help with walking and/or transfers;A lot of help with bathing/dressing/bathroom;Assistance with cooking/housework;Assist for transportation;Help with stairs or ramp for entrance   No    Equipment Recommendations None recommended by PT  Recommendations for Other Services       Functional Status Assessment Patient has had a recent decline in their  functional status and demonstrates the ability to make significant improvements in function in a reasonable and predictable amount of time.     Precautions / Restrictions Precautions Precaution Comments: right sacral wound (possible surgical debridement pending), R hemiparesis from previous CVA Restrictions Weight Bearing Restrictions: No      Mobility  Bed Mobility Overal bed mobility: Needs Assistance Bed Mobility: Supine to Sit     Supine to sit: Min guard, HOB elevated     General bed mobility comments: increased time and effort, utilized elevated HOB and rails to self assist    Transfers Overall transfer level: Needs assistance Equipment used: Rolling walker (2 wheels) Transfers: Sit to/from Stand, Bed to chair/wheelchair/BSC Sit to Stand: Mod assist, +2 physical assistance, From elevated surface   Step pivot transfers: Mod assist, +2 physical assistance       General transfer comment: min/guard to rise from elevated surface; cues for UE weight bearing through RW, pt taking a few steps for turning to sit in recliner and had LE buckling requiring mod assist ; assist to control descent    Ambulation/Gait                  Stairs            Wheelchair Mobility     Tilt Bed    Modified Rankin (Stroke Patients Only)       Balance Overall balance assessment: Needs assistance         Standing balance support: During functional activity, Reliant on assistive device for balance, Bilateral upper extremity supported Standing balance-Leahy Scale: Poor  Pertinent Vitals/Pain Pain Assessment Pain Assessment: Faces Faces Pain Scale: Hurts even more Pain Location: bottom (wound area) Pain Descriptors / Indicators: Discomfort Pain Intervention(s): Monitored during session, Repositioned    Home Living Family/patient expects to be discharged to:: Assisted living                 Home Equipment: Grab  bars - tub/shower;Grab bars - toilet;Shower seat;Other (comment) (higher toilet) Additional Comments: [facility]    Prior Function Prior Level of Function : Independent/Modified Independent             Mobility Comments: wheelchair level; has transport chair, manual w/c and electric w/c, uses RW to transfer to w/c ADLs Comments: assist for all ADLs     Hand Dominance   Dominant Hand: Right    Extremity/Trunk Assessment        Lower Extremity Assessment Lower Extremity Assessment: Generalized weakness;RLE deficits/detail RLE Deficits / Details: residual deficits from previous CVA    Cervical / Trunk Assessment Cervical / Trunk Assessment: Kyphotic  Communication   Communication: No difficulties  Cognition Arousal/Alertness: Awake/alert Behavior During Therapy: WFL for tasks assessed/performed Overall Cognitive Status: Within Functional Limits for tasks assessed                                          General Comments      Exercises     Assessment/Plan    PT Assessment Patient needs continued PT services  PT Problem List Decreased strength;Decreased balance;Decreased mobility;Obesity;Decreased skin integrity;Decreased knowledge of use of DME;Decreased activity tolerance       PT Treatment Interventions DME instruction;Therapeutic exercise;Balance training;Therapeutic activities;Functional mobility training;Patient/family education    PT Goals (Current goals can be found in the Care Plan section)  Acute Rehab PT Goals PT Goal Formulation: With patient Time For Goal Achievement: 09/24/22 Potential to Achieve Goals: Good    Frequency Min 1X/week     Co-evaluation               AM-PAC PT "6 Clicks" Mobility  Outcome Measure Help needed turning from your back to your side while in a flat bed without using bedrails?: A Little Help needed moving from lying on your back to sitting on the side of a flat bed without using  bedrails?: A Little Help needed moving to and from a bed to a chair (including a wheelchair)?: A Lot Help needed standing up from a chair using your arms (e.g., wheelchair or bedside chair)?: A Lot Help needed to walk in hospital room?: Total Help needed climbing 3-5 steps with a railing? : Total 6 Click Score: 12    End of Session Equipment Utilized During Treatment: Gait belt Activity Tolerance: Patient tolerated treatment well Patient left: in chair;with call bell/phone within reach;with chair alarm set;with family/visitor present Nurse Communication: Mobility status PT Visit Diagnosis: Other abnormalities of gait and mobility (R26.89);Muscle weakness (generalized) (M62.81)    Time: 0981-1914 PT Time Calculation (min) (ACUTE ONLY): 29 min   Charges:   PT Evaluation $PT Eval Moderate Complexity: 1 Mod PT Treatments $Therapeutic Activity: 8-22 mins PT General Charges $$ ACUTE PT VISIT: 1 Visit       Thomasene Mohair PT, DPT Physical Therapist Acute Rehabilitation Services Office: [phone]   Kati L Payson 09/10/2022, 1:04 PM

## 2022-09-10 NOTE — Plan of Care (Signed)
  Problem: Coping: Goal: Ability to adjust to condition or change in health will improve Outcome: Progressing   Problem: Education: Goal: Knowledge of General Education information will improve Description: Including pain rating scale, medication(s)/side effects and non-pharmacologic comfort measures Outcome: Progressing   Problem: Clinical Measurements: Goal: Ability to maintain clinical measurements within normal limits will improve Outcome: Progressing Goal: Diagnostic test results will improve Outcome: Progressing

## 2022-09-10 NOTE — Plan of Care (Signed)
  Problem: Education: Goal: Ability to describe self-care measures that may prevent or decrease complications (Diabetes Survival Skills Education) will improve Outcome: Progressing   Problem: Coping: Goal: Ability to adjust to condition or change in health will improve Outcome: Progressing   Problem: Health Behavior/Discharge Planning: Goal: Ability to identify and utilize available resources and services will improve Outcome: Progressing Goal: Ability to manage health-related needs will improve Outcome: Progressing   Problem: Tissue Perfusion: Goal: Adequacy of tissue perfusion will improve Outcome: Progressing   Problem: Skin Integrity: Goal: Risk for impaired skin integrity will decrease Outcome: Progressing   Problem: Metabolic: Goal: Ability to maintain appropriate glucose levels will improve Outcome: Not Progressing   Problem: Skin Integrity: Goal: Risk for impaired skin integrity will decrease Outcome: Not Progressing

## 2022-09-10 NOTE — Consult Note (Signed)
Consultation Note Date: 09/10/2022   Patient Name: [name] [name]  DOB: 03/22/1932  MRN: [id number]  Age / Sex: 89 y.o., female  PCP: [name], MD Referring Physician: Glade Lloyd, MD  Reason for Consultation: Establishing goals of care  HPI/Patient Profile: 89 y.o. female admitted on 09/08/2022.   Clinical Assessment and Goals of Care: 89 year old lady with hypertension diabetes history of stroke pancreatitis stage IIIb chronic kidney disease obesity.  Patient has chronic back 1.  Patient admitted with worsening weakness lethargy and back pain.  Recently diagnosed with COVID-19 on 09-06-2022 at her assisted living facility and was started on Paxlovid and also on Bactrim for back cellulitis. Patient admitted to hospital medicine service.  CT abdomen and pelvis with right sacral wound extending from skin surface to the outer surface of gluteal musculature with significant gas collection.  General surgery also consulted and following.  Patient placed on IV fluids and antibiotics.  Wound care also following.  Patient being considered for debridement next week.  Also found to have bacteremia, to undergo repeat blood cultures and to undergo surface echocardiogram if persistent bacteremia. Palliative consult for ongoing goals of care discussions has been requested. Patient awake alert resting in bed.  Daughter [name] [name] present at bedside. Palliative medicine is specialized medical care for people living with serious illness. It focuses on providing relief from the symptoms and stress of a serious illness. The goal is to improve quality of life for both the patient and the family. Goals of care: Broad aims of medical therapy in relation to the patient's values and preferences. Our aim is to provide medical care aimed at enabling patients to achieve the goals that matter most to them, given the circumstances of  their particular medical situation and their constraints.  Goals wishes and values attempted to be explored.  Discussed with the patient and daughter about scope of current hospitalization.  Brief life review performed.  Patient states that she is feeling better than yesterday.  She is hopeful for ongoing stabilization/recovery.  She does understand she has serious illness with regards to her cellulitis and right buttock deep tissue pressure injury as well as positive blood cultures.  For now, she desires continuation of full code/full scope status.  See below  NEXT OF KIN Son [name] [name] is noted to be healthcare power of attorney agent.  Patient states that he has a COVID and is resting at home. Another daughter [name] [name] is present at the bedside.  Patient has 4 children.  SUMMARY OF RECOMMENDATIONS   Full code-full scope for now.  CODE STATUS and goals of care discussions undertaken with the patient and daughter [name] [name] present at the bedside in detail. Patient not opposed to skilled nursing facility rehabilitation attempt with palliative services to follow on discharge.  Subsequently, she wishes to go home and do her son [name] [name] home after rehab attempt. Continue current mode of care.  Code Status/Advance Care Planning: Full code   Symptom Management:     Palliative Prophylaxis:  Frequent Pain Assessment  Additional Recommendations (Limitations, Scope, Preferences): Full Scope Treatment  Psycho-social/Spiritual:  Desire for further Chaplaincy support:yes Additional Recommendations: Caregiving  Support/Resources  Prognosis:  Unable to determine  Discharge Planning: To Be Determined      Primary Diagnoses: Present on Admission:  Cellulitis   I have reviewed the medical record, interviewed the patient and family, and examined the patient. The following aspects are pertinent.  Past Medical History:  Diagnosis Date   Colon polyp    Diabetes mellitus 1992    Hypertension 1996   Pancreatitis    Social History   Socioeconomic History   Marital status: Widowed    Spouse name: Not on file   Number of children: Not on file   Years of education: Not on file   Highest education level: Not on file  Occupational History   Not on file  Tobacco Use   Smoking status: Never   Smokeless tobacco: Never  Vaping Use   Vaping status: Never Used  Substance and Sexual Activity   Alcohol use: Never   Drug use: Never   Sexual activity: Not on file  Other Topics Concern   Not on file  Social History Narrative   Not on file   Social Determinants of Health   Financial Resource Strain: Low Risk  (06/14/2022)   Received from [facility], [facility]   Overall Financial Resource Strain (CARDIA)    Difficulty of Paying Living Expenses: Not hard at all  Food Insecurity: No Food Insecurity (09/08/2022)   Hunger Vital Sign    Worried About Running Out of Food in the Last Year: Never true    Ran Out of Food in the Last Year: Never true  Transportation Needs: No Transportation Needs (09/08/2022)   PRAPARE - Administrator, Civil Service (Medical): No    Lack of Transportation (Non-Medical): No  Physical Activity: Inactive (05/19/2019)   Received from [facility], [facility]   Exercise Vital Sign    Days of Exercise per Week: 0 days    Minutes of Exercise per Session: 0 min  Stress: No Stress Concern Present (12/05/2021)   Received from [facility], [facility] Occupational [facility] - Occupational Stress Questionnaire    Feeling of Stress : Not at all  Social Connections: Socially Integrated (12/05/2021)   Received from [facility], [facility]   Social Network    How would you rate your social network (family, work, friends)?: Good participation with social networks   History reviewed. No pertinent family history. Scheduled Meds:  furosemide  60 mg Intravenous Once   heparin injection (subcutaneous)   5,000 Units Subcutaneous Q8H   hydrALAZINE  100 mg Oral TID   insulin aspart  0-20 Units Subcutaneous TID WC   insulin aspart  0-5 Units Subcutaneous QHS   insulin aspart  15 Units Subcutaneous TID WC   insulin glargine-yfgn  25 Units Subcutaneous BID   ipratropium-albuterol  3 mL Nebulization TID   methylPREDNISolone (SOLU-MEDROL) injection  80 mg Intravenous Once   metoprolol tartrate  100 mg Oral BID   Continuous Infusions:  ceFEPime (MAXIPIME) IV 2 g (09/09/22 1214)   metronidazole 500 mg (09/10/22 1012)   [START ON 09/11/2022] vancomycin     PRN Meds:.albuterol, guaiFENesin-dextromethorphan, HYDROcodone-acetaminophen, metoprolol tartrate, ondansetron **OR** ondansetron (ZOFRAN) IV, mouth rinse, polyethylene glycol Medications Prior to Admission:  Prior to Admission medications   Medication Sig Start Date End Date Taking? Authorizing Provider  Acetaminophen 500  MG capsule Take 500 mg by mouth every 6 (six) hours as needed (for mild pain).   Yes [provider]  albuterol (VENTOLIN HFA) 108 (90 Base) MCG/ACT inhaler Inhale 2 puffs into the lungs every 6 (six) hours as needed for wheezing or shortness of breath.   Yes [provider]  amantadine (SYMMETREL) 100 MG capsule Take 1 capsule (100 mg total) by mouth daily. 02/12/20  Yes Love, Evlyn Kanner, PA-C  ASPIRIN 81 PO Take 81 mg by mouth in the morning. 10/07/21  Yes [provider]  atorvastatin (LIPITOR) 40 MG tablet Take 1 tablet (40 mg total) by mouth daily. 04/13/20  Yes McCue, Shanda Bumps, NP  chlorthalidone (HYGROTON) 25 MG tablet Take 1.5 tablets (37.5 mg total) by mouth daily. 02/12/20  Yes Love, Evlyn Kanner, PA-C  Continuous Blood Gluc Sensor (DEXCOM G6 SENSOR) MISC 1 each by Does not apply route daily. Patient taking differently: Inject 1 Device into the skin See admin instructions. Place 1 new device into the skin every 10 days 10/28/20  Yes Raulkar, Drema Pry, MD  Diclofenac Sodium (ASPERCREME ARTHRITIS PAIN EX)  Apply 1 application  topically every 12 (twelve) hours as needed (for pain- affected area of the left arm).   Yes [provider]  diclofenac Sodium (VOLTAREN ARTHRITIS PAIN) 1 % GEL Apply 2 g topically 4 (four) times daily. Patient taking differently: Apply 4 g topically See admin instructions. Apply 4 grams to the right knee four times a day 04/24/22  Yes Raulkar, Drema Pry, MD  furosemide (LASIX) 20 MG tablet Take 0.5 tablets (10 mg total) by mouth See admin instructions. Patient taking differently: Take 20 mg by mouth in the morning. 02/12/20  Yes Love, Evlyn Kanner, PA-C  hydrALAZINE (APRESOLINE) 100 MG tablet Take 1 tablet (100 mg total) by mouth 3 (three) times daily. 02/12/20  Yes Love, Evlyn Kanner, PA-C  insulin lispro (HUMALOG) 100 UNIT/ML KwikPen Inject 38-50 Units into the skin See admin instructions. Inject 38-50 units into the skin three times a day before meals, per sliding scale: BGL 70-199 = 38 units; 200-299 = 42 units; 300-399 = 46 units; 400 or greater = 50 units and FAX/CALL MD   Yes [provider]  LANTUS SOLOSTAR 100 UNIT/ML Solostar Pen Inject 34 Units into the skin at bedtime. 11/08/20  Yes [provider]  lidocaine (LIDODERM) 5 % APPLY (1) PATCH TO LOWER BACK AND RIGHT KNEE ONCE DAILY. LEAVE ON 12 HOURS. LEAVE OFF 12 HOURS. Patient taking differently: Place 1 patch onto the skin See admin instructions. Apply 1 patch to the lower back and right knee once a day- on 12 hours/off 12 hours 03/29/22  Yes Raulkar, Drema Pry, MD  Lidocaine HCl (ASPERCREME LIDOCAINE) 4 % CREA Apply 1 application  topically every 6 (six) hours as needed (for pain- right knee).   Yes [provider]  Liniments (BLUE-EMU SUPER STRENGTH EX) Apply 1 application  topically every 6 (six) hours as needed (for pain- right knee).   Yes [provider]  loratadine (CLARITIN) 10 MG tablet Take 10 mg by mouth in the morning.   Yes [provider]  magnesium oxide  (MAG-OX) 400 MG tablet Take 400 mg by mouth in the morning.   Yes [provider]  metoprolol tartrate (LOPRESSOR) 100 MG tablet Take 1 tablet (100 mg total) by mouth 2 (two) times daily. Patient taking differently: Take 100 mg by mouth in the morning and at bedtime. 02/12/20  Yes Love, Evlyn Kanner,  PA-C  miconazole (BAZA ANTIFUNGAL) 2 % cream Apply topically 2 (two) times daily. Patient taking differently: Apply 1 Application topically See admin instructions. Apply to affected area of the right big toe 2 times a day 03/27/22  Yes Raulkar, Drema Pry, MD  Neo-Bacit-Poly-Lidocaine (FIRST AID ANTIBIOTIC) 4 % OINT Apply 1 application  topically See admin instructions. Apply to affected area of the buttocks (topically) at bedtime- then cover with a clean bandage   Yes [provider]  Nirmatrelvir-Ritonavir (PAXLOVID, 150/100, PO) Take 1-2 tablets by mouth See admin instructions. Take one dose (2 tablets of nirmatrelvir and 1 tablet of ritonavir) twice a day for 5 days 09/07/22 09/11/22 Yes [provider]  pantoprazole (PROTONIX) 40 MG tablet Take 1 tablet (40 mg total) by mouth daily. 02/12/20 09/08/22 Yes Love, Evlyn Kanner, PA-C  polyethylene glycol (MIRALAX / GLYCOLAX) 17 g packet Take 17 g by mouth 2 (two) times daily. Patient taking differently: Take 17 g by mouth 2 (two) times daily as needed for mild constipation or moderate constipation. 02/12/20  Yes Love, Evlyn Kanner, PA-C  potassium chloride SA (KLOR-CON M) 20 MEQ tablet Take 20 mEq by mouth 2 (two) times daily.   Yes [provider]  QUEtiapine (SEROQUEL) 25 MG tablet Take 0.5 tablets (12.5 mg total) by mouth at bedtime as needed (anxiety). 03/23/20  Yes Jones Bales, NP  senna-docusate (SENOKOT-S) 8.6-50 MG tablet Take 1 tablet by mouth 2 (two) times daily. 02/12/20  Yes Love, Evlyn Kanner, PA-C  sulfamethoxazole-trimethoprim (BACTRIM DS) 800-160 MG tablet Take 2 tablets by mouth in the morning and at bedtime. 09/06/22  09/13/22 Yes [provider]  triamcinolone cream (KENALOG) 0.1 % Apply 1 Application topically 2 (two) times daily as needed (for rashes or irritation- affected areas). 10/01/20  Yes [provider]  ACCU-CHEK GUIDE test strip  11/08/20   [provider]  benzonatate (TESSALON) 100 MG capsule Take 1 capsule (100 mg total) by mouth every 8 (eight) hours. Patient not taking: Reported on 09/08/2022 01/08/22   Barrie Dunker B, PA-C  Continuous Blood Gluc Receiver (DEXCOM G6 RECEIVER) DEVI 1 each by Does not apply route daily. 10/28/20   Raulkar, Drema Pry, MD  Continuous Blood Gluc Transmit (DEXCOM G6 TRANSMITTER) MISC 1 each by Does not apply route daily. 10/28/20   Raulkar, Drema Pry, MD  DROPSAFE SAFETY PEN NEEDLES 31G X 6 MM MISC  10/20/20   [provider]  Insulin Syringe-Needle U-100 31G X 5/16" 1 ML MISC 1 application by Does not apply route 2 (two) times daily. 02/12/20   Jacquelynn Cree, PA-C   Allergies  Allergen Reactions   Amlodipine Other (See Comments)    EDEMA and "Allergic," per MAR   Ace Inhibitors Other (See Comments) and Cough    "Allergic," per [facility]   Atorvastatin Other (See Comments)    Muscle & joint aches and "Allergic," per MAR   Canagliflozin Other (See Comments)    INVOKANA = urinary tract infections and "Allergic," per MAR   Clonidine Hcl Other (See Comments)    "Allergic," per [facility]   Buprenorphine Other (See Comments)    "Allergic," per MAR   Lisinopril Other (See Comments)    "Allergic," per MAR   Morphine And Codeine Rash and Other (See Comments)    For long periods of time (??) and "Allergic," per [facility]   Review of Systems Denies pain Physical Exam Awake alert Resting comfortably in bed Appears with generalized weakness On supplemental oxygen nasal  cannula Has lower extremity edema Answers questions appropriately  Vital Signs: BP 129/60   Pulse 70   Temp 98.1 F (36.7 C) (Oral)   Resp 18   Ht 5\' 6"  (1.676 m)   Wt 101.3  kg   SpO2 96%   BMI 36.05 kg/m  Pain Scale: 0-10   Pain Score: 0-No pain   SpO2: SpO2: 96 % O2 Device:SpO2: 96 % O2 Flow Rate: .O2 Flow Rate (L/min): 2 L/min  IO: Intake/output summary:  Intake/Output Summary (Last 24 hours) at 09/10/2022 1104 Last data filed at 09/10/2022 1000 Gross per 24 hour  Intake 1256.28 ml  Output 1450 ml  Net -193.72 ml    LBM: Last BM Date : 09/06/22 Baseline Weight: Weight: 99.8 kg Most recent weight: Weight: 101.3 kg     Palliative Assessment/Data:   PPS 50%  Time In:  10 Time Out:  11 Time Total:  60  Greater than 50%  of this time was spent counseling and coordinating care related to the above assessment and plan.  Signed by: Rosalin Hawking, MD   Please contact Palliative Medicine Team phone at [phone] for questions and concerns.  For individual provider: See Loretha Stapler

## 2022-09-10 NOTE — Consult Note (Signed)
WOC Nurse Consult Note: Reason for Consult:right buttock DTPI in evolution. Patient was seen by Dr. Luisa Hart yesterday and Surgery will follow and see next week to see if debridement is possible. Dr Luisa Hart measures wound to be 4cm x 5cm with depth not fully realized due to the presence of nonviable tissue. Wound type:Pressure Pressure Injury POA: Yes  Conservative care orders are in place from my initial evaluation yesterday including topical care with a xeroform gauze (antimicrobial nonadherent), as well as turning and repositioning and placement on a mattress replacement with los air loss feature. As noted by Dr Luisa Hart, the patient cannot tolerate turning on her side for very long due to her respiratory status.Heels are to be floated. Her comorbid conditions include Covid and failure to thrive.  WOC nursing team will not follow, but will remain available to this patient, the nursing and medical teams.  Please re-consult if needed.  Any orders provided by Provider will supercede mine.  Thank you for inviting [place] to participate in this patient's Plan of Care.  Ladona Mow, MSN, RN, CNS, GNP, Leda Min, [employer], [employer] phone:  [phone]

## 2022-09-10 NOTE — TOC Progression Note (Signed)
Transition of Care [facility]) - Progression Note    Patient Details  Name: [name] [name] MRN: [id number] Date of Birth: October 13, 1932  Transition of Care [facility]) CM/SW Contact  Adrian Prows, RN Phone Number: 09/10/2022, 1:22 PM  Clinical Narrative:    Attempted to contact pt's dtr [name] to discuss d/c planning; LVM at [phone]; also attempted to contact intake a [facility], LVM at [phone]; unable to obtain pt residency and level of care; unable to complete TOC assessment.        Expected Discharge Plan and Services                                               Social Determinants of Health (SDOH) Interventions SDOH Screenings   Food Insecurity: No Food Insecurity (09/08/2022)  Housing: Low Risk  (09/08/2022)  Transportation Needs: No Transportation Needs (09/08/2022)  Utilities: Not At Risk (09/08/2022)  Depression (PHQ2-9): Low Risk  (03/27/2022)  Financial Resource Strain: Low Risk  (06/14/2022)   Received from [facility], [facility]  Physical Activity: Inactive (05/19/2019)   Received from [facility], [facility]  Social Connections: Socially Integrated (12/05/2021)   Received from [facility], [facility]  Stress: No Stress Concern Present (12/05/2021)   Received from [facility], [facility]  Tobacco Use: Low Risk  (09/08/2022)    Readmission Risk Interventions     No data to display

## 2022-09-11 ENCOUNTER — Other Ambulatory Visit (HOSPITAL_COMMUNITY): Payer: Medicare Other

## 2022-09-11 ENCOUNTER — Inpatient Hospital Stay (HOSPITAL_COMMUNITY): Payer: Medicare Other

## 2022-09-11 DIAGNOSIS — L03312 Cellulitis of back [any part except buttock]: Secondary | ICD-10-CM | POA: Diagnosis not present

## 2022-09-11 LAB — CBC WITH DIFFERENTIAL/PLATELET
Abs Immature Granulocytes: 0.22 10*3/uL — ABNORMAL HIGH (ref 0.00–0.07)
Basophils Absolute: 0 10*3/uL (ref 0.0–0.1)
Basophils Relative: 0 %
Eosinophils Absolute: 0 10*3/uL (ref 0.0–0.5)
Eosinophils Relative: 0 %
HCT: 30 % — ABNORMAL LOW (ref 36.0–46.0)
Hemoglobin: 9.5 g/dL — ABNORMAL LOW (ref 12.0–15.0)
Immature Granulocytes: 1 %
Lymphocytes Relative: 5 %
Lymphs Abs: 0.7 10*3/uL (ref 0.7–4.0)
MCH: 25.8 pg — ABNORMAL LOW (ref 26.0–34.0)
MCHC: 31.7 g/dL (ref 30.0–36.0)
MCV: 81.5 fL (ref 80.0–100.0)
Monocytes Absolute: 0.7 10*3/uL (ref 0.1–1.0)
Monocytes Relative: 5 %
Neutro Abs: 13.8 10*3/uL — ABNORMAL HIGH (ref 1.7–7.7)
Neutrophils Relative %: 89 %
Platelets: 309 10*3/uL (ref 150–400)
RBC: 3.68 MIL/uL — ABNORMAL LOW (ref 3.87–5.11)
RDW: 15.4 % (ref 11.5–15.5)
WBC: 15.5 10*3/uL — ABNORMAL HIGH (ref 4.0–10.5)
nRBC: 0 % (ref 0.0–0.2)

## 2022-09-11 LAB — CULTURE, BLOOD (ROUTINE X 2)

## 2022-09-11 LAB — GLUCOSE, CAPILLARY
Glucose-Capillary: 242 mg/dL — ABNORMAL HIGH (ref 70–99)
Glucose-Capillary: 347 mg/dL — ABNORMAL HIGH (ref 70–99)
Glucose-Capillary: 302 mg/dL — ABNORMAL HIGH (ref 70–99)
Glucose-Capillary: 316 mg/dL — ABNORMAL HIGH (ref 70–99)
Glucose-Capillary: 316 mg/dL — ABNORMAL HIGH (ref 70–99)
Glucose-Capillary: 350 mg/dL — ABNORMAL HIGH (ref 70–99)
Glucose-Capillary: 271 mg/dL — ABNORMAL HIGH (ref 70–99)
Glucose-Capillary: 246 mg/dL — ABNORMAL HIGH (ref 70–99)

## 2022-09-11 LAB — COMPREHENSIVE METABOLIC PANEL
ALT: 31 U/L (ref 0–44)
AST: 39 U/L (ref 15–41)
Albumin: 2.2 g/dL — ABNORMAL LOW (ref 3.5–5.0)
Alkaline Phosphatase: 74 U/L (ref 38–126)
Anion gap: 13 (ref 5–15)
BUN: 96 mg/dL — ABNORMAL HIGH (ref 8–23)
CO2: 20 mmol/L — ABNORMAL LOW (ref 22–32)
Calcium: 7.6 mg/dL — ABNORMAL LOW (ref 8.9–10.3)
Chloride: 97 mmol/L — ABNORMAL LOW (ref 98–111)
Creatinine, Ser: 3.38 mg/dL — ABNORMAL HIGH (ref 0.44–1.00)
GFR, Estimated: 13 mL/min — ABNORMAL LOW (ref >60)
Glucose, Bld: 301 mg/dL — ABNORMAL HIGH (ref 70–99)
Potassium: 4.2 mmol/L (ref 3.5–5.1)
Sodium: 130 mmol/L — ABNORMAL LOW (ref 135–145)
Total Bilirubin: 0.5 mg/dL (ref 0.3–1.2)
Total Protein: 5.8 g/dL — ABNORMAL LOW (ref 6.5–8.1)

## 2022-09-11 LAB — MAGNESIUM: Magnesium: 2.3 mg/dL (ref 1.7–2.4)

## 2022-09-11 LAB — C-REACTIVE PROTEIN: CRP: 17.8 mg/dL — ABNORMAL HIGH (ref <1.0)

## 2022-09-11 MED ORDER — ALBUMIN HUMAN 25 % IV SOLN
25.0000 g | Freq: Three times a day (TID) | INTRAVENOUS | Status: AC
  Administered 2022-09-11 – 2022-09-12 (×3): 25 g via INTRAVENOUS
  Filled 2022-09-11 (×3): qty 100

## 2022-09-11 MED ORDER — INSULIN ASPART 100 UNIT/ML IJ SOLN
20.0000 [IU] | Freq: Three times a day (TID) | INTRAMUSCULAR | Status: DC
  Administered 2022-09-11 – 2022-09-21 (×14): 20 [IU] via SUBCUTANEOUS

## 2022-09-11 MED ORDER — INSULIN ASPART 100 UNIT/ML IJ SOLN: 20.0000 [IU] | Freq: Three times a day (TID) | INTRAMUSCULAR | Status: DC

## 2022-09-11 MED ORDER — ACETAMINOPHEN 325 MG PO TABS
650.0000 mg | ORAL_TABLET | Freq: Four times a day (QID) | ORAL | Status: DC | PRN
  Administered 2022-09-11 – 2022-09-21 (×3): 650 mg via ORAL
  Filled 2022-09-11 (×3): qty 2

## 2022-09-11 MED ORDER — IPRATROPIUM-ALBUTEROL 0.5-2.5 (3) MG/3ML IN SOLN
3.0000 mL | Freq: Two times a day (BID) | RESPIRATORY_TRACT | Status: DC
  Administered 2022-09-11 – 2022-09-16 (×10): 3 mL via RESPIRATORY_TRACT
  Filled 2022-09-11 (×10): qty 3

## 2022-09-11 MED ORDER — INSULIN GLARGINE-YFGN 100 UNIT/ML ~~LOC~~ SOLN
30.0000 [IU] | Freq: Two times a day (BID) | SUBCUTANEOUS | Status: DC
  Administered 2022-09-11 (×2): 30 [IU] via SUBCUTANEOUS
  Filled 2022-09-11 (×3): qty 0.3

## 2022-09-11 NOTE — Progress Notes (Signed)
Progress Note     Subjective: Patient evaluated with sons at bedside. She reports she is generally sitting all the time currently either in a wheelchair or a lift chair to sleep.   Objective: Vital signs in last 24 hours: Temp:  [97.6 F (36.4 C)-97.8 F (36.6 C)] 97.6 F (36.4 C) (07/15 0507) Pulse Rate:  [58-79] 58 (07/15 0507) Resp:  [18-20] 18 (07/15 0507) BP: (108-134)/(56-114) 108/56 (07/15 0507) SpO2:  [96 %-100 %] 97 % (07/15 0507) Last BM Date : 09/06/22  Intake/Output from previous day: 07/14 0701 - 07/15 0700 In: 720 [P.O.:720] Out: 450 [Urine:450] Intake/Output this shift: No intake/output data recorded.  PE: General: pleasant, WD, obese female who is laying in bed in NAD Heart: regular, rate, and rhythm.  Lungs: On supplemental oxygen 2L via [place].  Respiratory effort nonlabored Abd: soft, NT, ND GU: right gluteal cleft with 3 necrotic appearing lesions that are densely adherent, purulent and malodorous drainage from most medial area, mild surrounding erythema    Lab Results:  Recent Labs    09/10/22 0528 09/11/22 0517  WBC 14.6* 15.5*  HGB 9.5* 9.5*  HCT 30.2* 30.0*  PLT 261 309   BMET Recent Labs    09/10/22 0528 09/11/22 0517  NA 133* 130*  K 4.8 4.2  CL 98 97*  CO2 19* 20*  GLUCOSE 375* 301*  BUN 68* 96*  CREATININE 2.98* 3.38*  CALCIUM 7.7* 7.6*   PT/INR No results for input(s): "LABPROT", "INR" in the last 72 hours. CMP     Component Value Date/Time   NA 130 (L) 09/11/2022 0517   K 4.2 09/11/2022 0517   CL 97 (L) 09/11/2022 0517   CO2 20 (L) 09/11/2022 0517   GLUCOSE 301 (H) 09/11/2022 0517   BUN 96 (H) 09/11/2022 0517   CREATININE 3.38 (H) 09/11/2022 0517   CALCIUM 7.6 (L) 09/11/2022 0517   PROT 5.8 (L) 09/11/2022 0517   ALBUMIN 2.2 (L) 09/11/2022 0517   AST 39 09/11/2022 0517   ALT 31 09/11/2022 0517   ALKPHOS 74 09/11/2022 0517   BILITOT 0.5 09/11/2022 0517   GFRNONAA 13 (L) 09/11/2022 0517   GFRAA 39 (L) 06/11/2019  1436   Lipase     Component Value Date/Time   LIPASE 21 06/11/2019 1436       Studies/Results: DG CHEST PORT 1 VIEW  Result Date: 09/09/2022 CLINICAL DATA:  Dyspnea.  Back pain.  Weakness. EXAM: PORTABLE CHEST 1 VIEW COMPARISON:  09/08/2022 FINDINGS: Atherosclerotic calcification of the aortic arch. Heart size within normal limits for AP projection. Mild atelectasis laterally at the left lung base. Otherwise unremarkable. IMPRESSION: 1. Mild atelectasis laterally at the left lung base. 2.  Aortic Atherosclerosis (ICD10-I70.0). Electronically Signed   By: Gaylyn Rong M.D.   On: 09/09/2022 17:21    Anti-infectives: Anti-infectives (From admission, onward)    Start     Dose/Rate Route Frequency Ordered Stop   09/11/22 0000  vancomycin (VANCOREADY) IVPB 750 mg/150 mL        750 mg 150 mL/hr over 60 Minutes Intravenous Every 48 hours 09/09/22 0010     09/09/22 1200  ceFEPIme (MAXIPIME) 2 g in sodium chloride 0.9 % 100 mL IVPB        2 g 200 mL/hr over 30 Minutes Intravenous Every 24 hours 09/09/22 0010     09/09/22 1000  metroNIDAZOLE (FLAGYL) IVPB 500 mg        500 mg 100 mL/hr over 60 Minutes Intravenous Every 12  hours 09/09/22 0737     09/09/22 0045  vancomycin (VANCOREADY) IVPB 2000 mg/400 mL        2,000 mg 200 mL/hr over 120 Minutes Intravenous  Once 09/08/22 2358 09/09/22 0252   09/08/22 1345  piperacillin-tazobactam (ZOSYN) IVPB 3.375 g        3.375 g 100 mL/hr over 30 Minutes Intravenous  Once 09/08/22 1336 09/08/22 1444        Assessment/Plan  Unstageable Sacral/gluteal wound  - CT 7/12 with above extending into musculature, significant gas and stranding  - wound is not really open on my exam and some necrotic tissue overlying, purulent drainage expressed from most medial eschar which is densely adherent and not able to be removed or debrided at the bedside - WBC 15 from 18 on admit - continue IV abx - this would benefit from debridement in the OR but does not  need to be emergent - can plan for tomorrow  - ECHO pending   FEN: HH/CM diet  VTE: SQH ID: [email or web address]  - per primary -  COVID+ - diagnosed 09/06/22 MRSA bacteremia  AKI on CKD stage IIIb Anemia of chronic disease HTN T2DM Physical deconditioning    LOS: 3 days   I reviewed hospitalist notes, last 24 h vitals and pain scores, last 48 h intake and output, last 24 h labs and trends, and last 24 h imaging results.   Juliet Rude, [facility] Surgery 09/11/2022, 7:56 AM Please see Amion for pager number during day hours 7:00am-4:30pm

## 2022-09-11 NOTE — Plan of Care (Signed)
  Problem: Health Behavior/Discharge Planning: Goal: Ability to identify and utilize available resources and services will improve Outcome: Progressing   Problem: Metabolic: Goal: Ability to maintain appropriate glucose levels will improve Outcome: Progressing   Problem: Nutritional: Goal: Maintenance of adequate nutrition will improve Outcome: Progressing   Problem: Activity: Goal: Risk for activity intolerance will decrease Outcome: Progressing   Problem: Pain Managment: Goal: General experience of comfort will improve Outcome: Progressing   Problem: Skin Integrity: Goal: Risk for impaired skin integrity will decrease Outcome: Progressing   Problem: Education: Goal: Ability to describe self-care measures that may prevent or decrease complications (Diabetes Survival Skills Education) will improve Outcome: Not Progressing Note: Needs reinforcement regarding importance of turning and ambulating for her recovery and skin breakdown prevention.

## 2022-09-11 NOTE — Consult Note (Signed)
Nephrology Consult   Assessment/Recommendations:   AKI on CKD3b -followed by Dr. Signe Colt. BL Cr ~1.5-1.7. AKI likely secondary to ATN from sepsis and soft BPs. Potential recovery hindered by the use of lasix in the last 2 days and hyperglycemia. Agree with holding home chlorthalidone, losartan, farxiga, lasix -renal ultrasound pending. Of note, no obstruction on initial CT -repeat UA -will trial her with albumin 25g q8h x 1 day, hold lasix for now -given age/chronic back wound/functional status would recommend against renal replacement therapy if indicated. Discussed with patient and son at the bedside and she is in agreement (it is her preexisting wish to NOT dialysis if indicated given what she saw her husband go through) Appreciate palliative care's assistance -Avoid nephrotoxic medications including NSAIDs and iodinated intravenous contrast exposure unless the latter is absolutely indicated.  Preferred narcotic agents for pain control are hydromorphone, fentanyl, and methadone. Morphine should not be used. Avoid Baclofen and avoid oral sodium phosphate and magnesium citrate based laxatives / bowel preps. Continue strict Input and Output monitoring. Will monitor the patient closely with you and intervene or adjust therapy as indicated by changes in clinical status/labs   Rt buttock cellulitis MRSA bacteremia -on cefazolin, vanc. Per primary. Surgery following, possible debridement?  COVID19 -supportive care per primary service  Uncontrolled DM2 with hyperglycemia -per primary service  Anemia of chronic disease -transfuse prn for Hgb <7. Avoid IV Fe  HTN -BP acceptable  Hyponatremia -corrected Na for hyperglyc is around 133  Hypoalbuminemia -push protein  Recommendations conveyed to primary service.    [name] 09/11/2022 7:50 AM   _____________________________________________________________________________________   History of Present  Illness: [name] [name] is a/an 89 y.o. female with a past medical history of CKD 3B, DM2, hypertension, chronic back wound, history of CVA, obesity, history of pancreatitis who presents to [facility] with worsening weakness, lethargy, back pain.  She was recently diagnosed with COVID-19 on 7/10 versus 11.  He was started on Paxlovid at that time.  She was also started on Bactrim given diagnosis cellulitis.  Here, she was found to have white count of 18.5, creatinine up to 3.24.  CT abdomen pelvis without contrast showed right sacral wound extending from the skin surface to the outer surface of the gluteal musculature with significant gas collection.  She was initially started on IV fluids and antibiotics.  Given her volume status, she did receive Lasix in the last 2 days.  Creatinine has been uprising since then with creatinine up to 3.38 now. Patient seen and examined bedside. Sons at the bedside. She reports that her swelling is better, feels like she doesn't have any swelling. She reports that she needs to do better with drinking water. No other complaints. UOP thus far 700cc in canister.    Medications:  Current Facility-Administered Medications  Medication Dose Route Frequency Provider Last Rate Last Admin   albuterol (PROVENTIL) (2.5 MG/3ML) 0.083% nebulizer solution 3 mL  3 mL Inhalation Q2H PRN Alekh, Kshitiz, MD       ceFEPIme (MAXIPIME) 2 g in sodium chloride 0.9 % 100 mL IVPB  2 g Intravenous Q24H Maurice March, RPH 200 mL/hr at 09/10/22 1207 2 g at 09/10/22 1207   guaiFENesin-dextromethorphan (ROBITUSSIN DM) 100-10 MG/5ML syrup 5 mL  5 mL Oral Q4H PRN Glade Lloyd, MD   5 mL at 09/09/22 1238   heparin injection 5,000 Units  5,000 Units Subcutaneous Q8H Glade Lloyd, MD   5,000 Units at 09/11/22 0623   hydrALAZINE (  APRESOLINE) tablet 100 mg  100 mg Oral TID Randel Pigg, Dorma Russell, MD   100 mg at 09/10/22 2202   HYDROcodone-acetaminophen (NORCO/VICODIN) 5-325 MG per tablet 1-2 tablet  1-2  tablet Oral Q4H PRN Randel Pigg, Dorma Russell, MD       insulin aspart (novoLOG) injection 0-20 Units  0-20 Units Subcutaneous TID WC Glade Lloyd, MD   15 Units at 09/10/22 1807   insulin aspart (novoLOG) injection 0-5 Units  0-5 Units Subcutaneous QHS Randel Pigg, Dorma Russell, MD   4 Units at 09/10/22 2203   insulin aspart (novoLOG) injection 15 Units  15 Units Subcutaneous TID WC Glade Lloyd, MD   15 Units at 09/10/22 1808   insulin glargine-yfgn (SEMGLEE) injection 25 Units  25 Units Subcutaneous BID Glade Lloyd, MD   25 Units at 09/10/22 2203   ipratropium-albuterol (DUONEB) 0.5-2.5 (3) MG/3ML nebulizer solution 3 mL  3 mL Nebulization TID Glade Lloyd, MD   3 mL at 09/10/22 1945   metoprolol tartrate (LOPRESSOR) injection 5 mg  5 mg Intravenous Q8H PRN Randel Pigg, Dorma Russell, MD       metoprolol tartrate (LOPRESSOR) tablet 100 mg  100 mg Oral BID Randel Pigg, Dorma Russell, MD   100 mg at 09/10/22 2203   metroNIDAZOLE (FLAGYL) IVPB 500 mg  500 mg Intravenous Q12H Alekh, Kshitiz, MD 100 mL/hr at 09/10/22 2209 500 mg at 09/10/22 2209   ondansetron (ZOFRAN) tablet 4 mg  4 mg Oral Q6H PRN Randel Pigg, Dorma Russell, MD       Or   ondansetron [facility]) injection 4 mg  4 mg Intravenous Q6H PRN Randel Pigg, Dorma Russell, MD       Oral care mouth rinse  15 mL Mouth Rinse PRN Randel Pigg, Dorma Russell, MD       polyethylene glycol (MIRALAX / GLYCOLAX) packet 17 g  17 g Oral Daily PRN Randel Pigg, Dorma Russell, MD       QUEtiapine (SEROQUEL) tablet 12.5 mg  12.5 mg Oral QHS PRN Glade Lloyd, MD       vancomycin (VANCOREADY) IVPB 750 mg/150 mL  750 mg Intravenous Q48H Maurice March, RPH 150 mL/hr at 09/10/22 2312 750 mg at 09/10/22 2312     ALLERGIES Amlodipine, Ace inhibitors, Atorvastatin, Canagliflozin, Clonidine hcl, Buprenorphine, Lisinopril, and Morphine and codeine  MEDICAL HISTORY Past Medical History:  Diagnosis Date   Colon polyp    Diabetes mellitus 1992   Hypertension 1996   Pancreatitis      SOCIAL  HISTORY Social History   Socioeconomic History   Marital status: Widowed    Spouse name: Not on file   Number of children: Not on file   Years of education: Not on file   Highest education level: Not on file  Occupational History   Not on file  Tobacco Use   Smoking status: Never   Smokeless tobacco: Never  Vaping Use   Vaping status: Never Used  Substance and Sexual Activity   Alcohol use: Never   Drug use: Never   Sexual activity: Not on file  Other Topics Concern   Not on file  Social History Narrative   Not on file   Social Determinants of Health   Financial Resource Strain: Low Risk  (06/14/2022)   Received from [facility], [facility]   Overall Financial Resource Strain (CARDIA)    Difficulty of Paying Living Expenses: Not hard at all  Food Insecurity: No Food Insecurity (09/08/2022)   Hunger Vital Sign    Worried About Running Out of  Food in the Last Year: Never true    Ran Out of Food in the Last Year: Never true  Transportation Needs: No Transportation Needs (09/08/2022)   PRAPARE - Administrator, Civil Service (Medical): No    Lack of Transportation (Non-Medical): No  Physical Activity: Inactive (05/19/2019)   Received from [facility], [facility]   Exercise Vital Sign    Days of Exercise per Week: 0 days    Minutes of Exercise per Session: 0 min  Stress: No Stress Concern Present (12/05/2021)   Received from [facility], [facility] - Occupational Stress Questionnaire    Feeling of Stress : Not at all  Social Connections: Socially Integrated (12/05/2021)   Received from [facility], [facility]   Social Network    How would you rate your social network (family, work, friends)?: Good participation with social networks  Intimate Partner Violence: Not At Risk (09/08/2022)   Humiliation, Afraid, Rape, and Kick questionnaire    Fear of Current or Ex-Partner: No    Emotionally Abused: No     Physically Abused: No    Sexually Abused: No     FAMILY HISTORY History reviewed. No pertinent family history.   Review of Systems: 12 systems reviewed Otherwise as per HPI, all other systems reviewed and negative  Physical Exam: Vitals:   09/10/22 2038 09/11/22 0507  BP: 134/76 (!) 108/56  Pulse: 69 (!) 58  Resp: 18 18  Temp: 97.8 F (36.6 C) 97.6 F (36.4 C)  SpO2: 97% 97%   No intake/output data recorded.  Intake/Output Summary (Last 24 hours) at 09/11/2022 0750 Last data filed at 09/10/2022 1800 Gross per 24 hour  Intake 720 ml  Output 450 ml  Net 270 ml   General: NAD, deconditioned HEENT: anicteric sclera, oropharynx clear without lesions CV: regular rate, normal rhythm, no murmurs, no gallops, no rubs Lungs: decreased breath sounds bibasilar, normal WOB Abd: soft, non-tender, slightly distended Skin: no visible lesions or rashes Psych: alert, engaged, appropriate mood and affect Musculoskeletal: trace pretibial edema bilaterally Neuro: normal speech, no gross focal deficits, no myoclonic jerking observed  Test Results Reviewed Lab Results  Component Value Date   NA 130 (L) 09/11/2022   K 4.2 09/11/2022   CL 97 (L) 09/11/2022   CO2 20 (L) 09/11/2022   BUN 96 (H) 09/11/2022   CREATININE 3.38 (H) 09/11/2022   CALCIUM 7.6 (L) 09/11/2022   ALBUMIN 2.2 (L) 09/11/2022   PHOS 5.0 (H) 01/15/2020     I have reviewed all relevant outside healthcare records related to the patient's kidney injury.

## 2022-09-11 NOTE — Progress Notes (Signed)
PROGRESS NOTE    CHRISHAUNA MEE  WUJ:[id number] DOB: 1932-11-07 DOA: 09/08/2022 PCP: [name], MD   Brief Narrative:  89 y.o. female with medical history significant of hypertension, diabetes mellitus type 2, prior stroke, pancreatitis, hyperlipidemia, CKD stage IIIb, obesity, chronic back wound presented with worsening weakness, lethargy and back pain.  Patient was apparently diagnosed with COVID-19 on 09/06/2022 at her assisted living facility and was started on Paxlovid and was also started on Bactrim for diagnosis of cellulitis of the back.  On presentation, WBCs was 18.5, creatinine was 3.24.  CT of abdomen and pelvis without contrast showed right sacral wound extending from the skin surface to the outer surface of the gluteal musculature with significant gas collection.  She was started on IV fluids and antibiotics.  General surgery was consulted.  Palliative care also consulted for goals of care discussion.  Assessment & Plan:   Cellulitis around right buttock deep tissue pressure injury: Present on admission Methicillin-resistant coagulase-negative Staphylococcus bacteremia -Patient has had low back/sacral wound for few months now.  Presented with cellulitis getting worse despite being on Bactrim for few days.  Imaging as above. -Currently on broad-spectrum antibiotics.  Follow wound care recommendations.  General surgery following.  No need for any acute intervention this weekend.  Might need debridement next week. -Blood cultures growing Staphylococcus epidermidis in 1 set.  Unclear if this is contaminant or is clinically significant since patient has infected decubitus ulcer.  Follow repeat cultures from 09/10/2022.  Follow 2D.  Continue current antibiotics  Leukocytosis -Worse today at 15.5.  Monitor  Acute kidney injury on chronic kidney disease stage IIIb Possible volume overload -Possibly from above. Creatinine around 1.4-1.8.  Creatinine 3.04 on presentation.  Worsening to  3.38 today.  Will check renal ultrasound.  Patient received IV fluids initially but was discontinued and patient received couple of doses of IV Lasix for volume overload.  Will consult nephrology.    Recent diagnosis of COVID-19 -Diagnosed on 09/06/2022.  Continue isolation.  Given renal failure, will hold Paxlovid. -Continue supportive care. -CRP elevated at 27 possibly from 1 infection and COVID.  Monitor  Hyponatremia -Sodium 130 today.  Monitor.  Anemia of chronic disease -From chronic illnesses.  Hemoglobin stable.  Monitor intermittently.    Mildly elevated AST -Resolved  Hypertension -Monitor blood pressure.  Continue hydralazine and metoprolol.  Holding losartan, chlorthalidone and Lasix.    Diabetes mellitus type 2 with hyperglycemia -A1c 9.8.  Continue CBGs with SSI and continue NovoLog with meals.  Blood sugars still elevated.  Increase long-acting and short acting insulin.  [name] on hold  Physical deconditioning -PT eval  Goals of care -Patient is full code.  Discussed with the patient and daughter at bedside about the guarded prognosis on 09/09/2022.  They want to be full code for now.  palliative care discussion pending  DVT prophylaxis: heparin subcutaneous  code Status: Full Family Communication: Son at bedside Disposition Plan: Status is: Inpatient Remains inpatient appropriate because: Of severity of illness    Consultants: General surgery.  palliative care  Procedures: None  Antimicrobials: Cefepime, Flagyl and vancomycin from 09/08/2022 onwards   Subjective: Patient seen and examined at bedside.  Still complains of intermittent cough and shortness of breath.  No fever, vomiting, chest pain reported.  Feels slightly better but still weak. Objective: Vitals:   09/10/22 1808 09/10/22 1945 09/10/22 2038 09/11/22 0507  BP: 132/74  134/76 (!) 108/56  Pulse:   69 (!) 58  Resp:   18  18  Temp:   97.8 F (36.6 C) 97.6 F (36.4 C)  TempSrc:   Oral    SpO2:  98% 97% 97%  Weight:      Height:        Intake/Output Summary (Last 24 hours) at 09/11/2022 0727 Last data filed at 09/10/2022 1800 Gross per 24 hour  Intake 720 ml  Output 450 ml  Net 270 ml   Filed Weights   09/08/22 1222 09/08/22 1939  Weight: 99.8 kg 101.3 kg    Examination:  General: No acute distress.  Currently on 2 L oxygen via nasal cannula.  Looks chronically ill and deconditioned. ENT/neck: No JVD elevation or palpable thyromegaly noted  respiratory: Bilateral decreased breath sounds at bases with scattered crackles  CVS: Rate mostly controlled; S1 and S2 heard Abdominal: Soft, nontender, mildly distended; no organomegaly, bowel sounds milliard extremities: No clubbing; mild lower extremity edema present, more on the right side CNS: Alert and oriented.  Still slow to respond.  Poor historian.  No obvious deficits noted  lymph: No obvious palpable lymphadenopathy  skin: No obvious petechiae/rashes psych: Not agitated currently.  Affect is flat.   Musculoskeletal: No obvious joint tenderness/erythema     Data Reviewed: I have personally reviewed following labs and imaging studies  CBC: Recent Labs  Lab 09/08/22 1234 09/09/22 0529 09/10/22 0528 09/11/22 0517  WBC 18.5* 16.7* 14.6* 15.5*  NEUTROABS 15.6*  --  13.6* 13.8*  HGB 10.5* 9.8* 9.5* 9.5*  HCT 33.3* 31.5* 30.2* 30.0*  MCV 82.2 83.8 82.3 81.5  PLT 240 238 261 309   Basic Metabolic Panel: Recent Labs  Lab 09/08/22 1234 09/08/22 1605 09/09/22 0529 09/10/22 0528 09/11/22 0517  NA 130* 132* 131* 133* 130*  K 4.4 4.3 4.4 4.8 4.2  CL 95* 97* 96* 98 97*  CO2 21* 22 21* 19* 20*  GLUCOSE 176* 206* 259* 375* 301*  BUN 51* 50* 55* 68* 96*  CREATININE 3.24* 2.90* 2.82* 2.98* 3.38*  CALCIUM 7.4* 7.4* 7.3* 7.7* 7.6*  MG  --   --   --  2.1 2.3   GFR: Estimated Creatinine Clearance: 14.1 mL/min (A) (by C-G formula based on SCr of 3.38 mg/dL (H)). Liver Function Tests: Recent Labs  Lab  09/08/22 1234 09/09/22 0529 09/10/22 0528 09/11/22 0517  AST 53* 38 30 39  ALT 26 28 25 31   ALKPHOS 73 79 81 74  BILITOT 0.8 1.0 0.8 0.5  PROT 6.8 6.1* 6.1* 5.8*  ALBUMIN 2.8* 2.4* 2.4* 2.2*   No results for input(s): "LIPASE", "AMYLASE" in the last 168 hours. No results for input(s): "AMMONIA" in the last 168 hours. Coagulation Profile: No results for input(s): "INR", "PROTIME" in the last 168 hours. Cardiac Enzymes: No results for input(s): "CKTOTAL", "CKMB", "CKMBINDEX", "TROPONINI" in the last 168 hours. BNP (last 3 results) No results for input(s): "PROBNP" in the last 8760 hours. HbA1C: Recent Labs    09/08/22 1234  HGBA1C 9.8*   CBG: Recent Labs  Lab 09/09/22 1637 09/09/22 2102 09/10/22 0814 09/10/22 1142 09/10/22 1253  GLUCAP 270* 268* 382* 435* 399*   Lipid Profile: No results for input(s): "CHOL", "HDL", "LDLCALC", "TRIG", "CHOLHDL", "LDLDIRECT" in the last 72 hours. Thyroid Function Tests: No results for input(s): "TSH", "T4TOTAL", "FREET4", "T3FREE", "THYROIDAB" in the last 72 hours. Anemia Panel: No results for input(s): "VITAMINB12", "FOLATE", "FERRITIN", "TIBC", "IRON", "RETICCTPCT" in the last 72 hours. Sepsis Labs: Recent Labs  Lab 09/08/22 1323  LATICACIDVEN 1.8    Recent Results (  from the past 240 hour(s))  Culture, blood (routine x 2)     Status: Abnormal (Preliminary result)   Collection Time: 09/08/22  1:28 PM   Specimen: BLOOD  Result Value Ref Range Status   Specimen Description   Final    BLOOD BLOOD LEFT FOREARM Performed at [facility], [address], [place], [place] [address]    Special Requests   Final    BOTTLES DRAWN AEROBIC AND ANAEROBIC Blood Culture results may not be optimal due to an inadequate volume of blood received in culture bottles Performed at [facility], [address] [place], [place] [address]    Culture  Setup Time   Final    GRAM POSITIVE COCCI AEROBIC BOTTLE  ONLY CRITICAL RESULT CALLED TO, READ BACK BY AND VERIFIED WITH: PHARMD DREW WOFFORD [id number] 1730 BY J RAZZAK, MT    Culture (A)  Final    STAPHYLOCOCCUS EPIDERMIDIS THE SIGNIFICANCE OF ISOLATING THIS ORGANISM FROM A SINGLE SET OF BLOOD CULTURES WHEN MULTIPLE SETS ARE DRAWN IS UNCERTAIN. PLEASE NOTIFY THE MICROBIOLOGY DEPARTMENT WITHIN ONE WEEK IF SPECIATION AND SENSITIVITIES ARE REQUIRED. Performed at [facility], [address] [place], [place] [address]    Report Status PENDING  Incomplete  Blood Culture ID Panel (Reflexed)     Status: Abnormal   Collection Time: 09/08/22  1:28 PM  Result Value Ref Range Status   Enterococcus faecalis NOT DETECTED NOT DETECTED Final   Enterococcus Faecium NOT DETECTED NOT DETECTED Final   Listeria monocytogenes NOT DETECTED NOT DETECTED Final   Staphylococcus species DETECTED (A) NOT DETECTED Final    Comment: CRITICAL RESULT CALLED TO, READ BACK BY AND VERIFIED WITH: PHARMD DREW WOFFORD [id number] 1730 BY J RAZZAK, MT    Staphylococcus aureus (BCID) NOT DETECTED NOT DETECTED Final   Staphylococcus epidermidis DETECTED (A) NOT DETECTED Final    Comment: Methicillin (oxacillin) resistant coagulase negative staphylococcus. Possible blood culture contaminant (unless isolated from more than one blood culture draw or clinical case suggests pathogenicity). No antibiotic treatment is indicated for blood  culture contaminants. CRITICAL RESULT CALLED TO, READ BACK BY AND VERIFIED WITH: PHARMD DREW WOFFORD [id number] 1730 BY J RAZZAK, MT    Staphylococcus lugdunensis NOT DETECTED NOT DETECTED Final   Streptococcus species NOT DETECTED NOT DETECTED Final   Streptococcus agalactiae NOT DETECTED NOT DETECTED Final   Streptococcus pneumoniae NOT DETECTED NOT DETECTED Final   Streptococcus pyogenes NOT DETECTED NOT DETECTED Final   A.calcoaceticus-baumannii NOT DETECTED NOT DETECTED Final   Bacteroides fragilis NOT DETECTED NOT DETECTED Final    Enterobacterales NOT DETECTED NOT DETECTED Final   Enterobacter cloacae complex NOT DETECTED NOT DETECTED Final   Escherichia coli NOT DETECTED NOT DETECTED Final   Klebsiella aerogenes NOT DETECTED NOT DETECTED Final   Klebsiella oxytoca NOT DETECTED NOT DETECTED Final   Klebsiella pneumoniae NOT DETECTED NOT DETECTED Final   Proteus species NOT DETECTED NOT DETECTED Final   Salmonella species NOT DETECTED NOT DETECTED Final   Serratia marcescens NOT DETECTED NOT DETECTED Final   Haemophilus influenzae NOT DETECTED NOT DETECTED Final   Neisseria meningitidis NOT DETECTED NOT DETECTED Final   Pseudomonas aeruginosa NOT DETECTED NOT DETECTED Final   Stenotrophomonas maltophilia NOT DETECTED NOT DETECTED Final   Candida albicans NOT DETECTED NOT DETECTED Final   Candida auris NOT DETECTED NOT DETECTED Final   Candida glabrata NOT DETECTED NOT DETECTED Final   Candida krusei NOT DETECTED NOT DETECTED Final   Candida parapsilosis  NOT DETECTED NOT DETECTED Final   Candida tropicalis NOT DETECTED NOT DETECTED Final   Cryptococcus neoformans/gattii NOT DETECTED NOT DETECTED Final   Methicillin resistance mecA/C DETECTED (A) NOT DETECTED Final    Comment: CRITICAL RESULT CALLED TO, READ BACK BY AND VERIFIED WITH: Nathanial Millman [facility] [id number] 1730 BY Berline Chough, MT Performed at [facility], [address], [place], [place] [address]   Culture, blood (routine x 2)     Status: None (Preliminary result)   Collection Time: 09/08/22  1:59 PM   Specimen: BLOOD  Result Value Ref Range Status   Specimen Description   Final    BLOOD BLOOD LEFT HAND Performed at [facility], [address], [place], [place] [address]    Special Requests   Final    BOTTLES DRAWN AEROBIC ONLY Blood Culture results may not be optimal due to an inadequate volume of blood received in culture bottles Performed at [facility], [address] [place], [place] [address]     Culture   Final    NO GROWTH 2 DAYS Performed at [facility], [address] [place], [place] [address]    Report Status PENDING  Incomplete  MRSA Next Gen by PCR, Nasal     Status: None   Collection Time: 09/08/22 11:13 PM   Specimen: Nasal Mucosa; Nasal Swab  Result Value Ref Range Status   MRSA by PCR Next Gen NOT DETECTED NOT DETECTED Final    Comment: (NOTE) The GeneXpert MRSA Assay (FDA approved for NASAL specimens only), is one component of a comprehensive MRSA colonization surveillance program. It is not intended to diagnose MRSA infection nor to guide or monitor treatment for MRSA infections. Test performance is not FDA approved in patients less than 76 years old. Performed at [facility], [address], [place], [place] [address]   Culture, blood (Routine X 2) w Reflex to ID Panel     Status: None (Preliminary result)   Collection Time: 09/10/22  5:32 AM   Specimen: BLOOD  Result Value Ref Range Status   Specimen Description   Final    BLOOD BLOOD LEFT ARM Performed at [facility], [address], [place], [place] [address]    Special Requests   Final    Blood Culture adequate volume BOTTLES DRAWN AEROBIC AND ANAEROBIC Performed at [facility], [address] [place], [place] [address]    Culture   Final    NO GROWTH < 12 HOURS Performed at [facility], [address] [place], [place] [address]    Report Status PENDING  Incomplete  Culture, blood (Routine X 2) w Reflex to ID Panel     Status: None (Preliminary result)   Collection Time: 09/10/22  5:32 AM   Specimen: BLOOD  Result Value Ref Range Status   Specimen Description   Final    BLOOD BLOOD LEFT ARM Performed at [facility], [address], [place], [place] [address]    Special Requests   Final    Blood Culture adequate volume BOTTLES DRAWN AEROBIC AND ANAEROBIC Performed at [facility],  [address], [place], [place] [address]    Culture   Final    NO GROWTH < 12 HOURS Performed at [facility], [address] [place], [place] [address]    Report Status PENDING  Incomplete  Radiology Studies: DG CHEST PORT 1 VIEW  Result Date: 09/09/2022 CLINICAL DATA:  Dyspnea.  Back pain.  Weakness. EXAM: PORTABLE CHEST 1 VIEW COMPARISON:  09/08/2022 FINDINGS: Atherosclerotic calcification of the aortic arch. Heart size within normal limits for AP projection. Mild atelectasis laterally at the left lung base. Otherwise unremarkable. IMPRESSION: 1. Mild atelectasis laterally at the left lung base. 2.  Aortic Atherosclerosis (ICD10-I70.0). Electronically Signed   By: Gaylyn Rong M.D.   On: 09/09/2022 17:21        Scheduled Meds:  heparin injection (subcutaneous)  5,000 Units Subcutaneous Q8H   hydrALAZINE  100 mg Oral TID   insulin aspart  0-20 Units Subcutaneous TID WC   insulin aspart  0-5 Units Subcutaneous QHS   insulin aspart  15 Units Subcutaneous TID WC   insulin glargine-yfgn  25 Units Subcutaneous BID   ipratropium-albuterol  3 mL Nebulization TID   metoprolol tartrate  100 mg Oral BID   Continuous Infusions:  ceFEPime (MAXIPIME) IV 2 g (09/10/22 1207)   metronidazole 500 mg (09/10/22 2209)   vancomycin 750 mg (09/10/22 2312)          Glade Lloyd, MD [facility] 09/11/2022, 7:27 AM

## 2022-09-11 NOTE — TOC Initial Note (Addendum)
Transition of Care [facility]) - Initial/Assessment Note    Patient Details  Name: [name] [name] MRN: [id number] Date of Birth: 12/25/1932  [facility] CM/SW Contact:    Otelia Santee, LCSW Phone Number: 09/11/2022, 1:30 PM  Clinical Narrative:                 Pt currently recommended for SNF placement. Pt tested positive for COVID on 7/10. Pt will require 10 day isolation period prior to being able to transfer to SNF.  Attempted to reach pt's daughter, [name]. Voicemail left ,awaiting return call.  Update 4:15pm- Spoke with pt's son, Molly Maduro "Link Snuffer" who shares that pt has been living at [facility]. Family are interested in her transferring to SNF. Families top choice for SNF is [facility], [facility], and [facility]. CSW shared barriers in getting pt placed at some facilities mentioned. Family plan to begin seeking LTC placement at SNF and have been made aware this is something that has to be continued outpatient as pt is unable to remain in hospital while seeking LTC placement.  CSW will begin referring pt to SNF closer to end of COVID isolation period.   Expected Discharge Plan: Skilled Nursing Facility Barriers to Discharge: Continued Medical Work up, SNF Covid   Patient Goals and CMS Choice Patient states their goals for this hospitalization and ongoing recovery are:: Unable to assess CMS Medicare.gov Compare Post Acute Care list provided to:: Patient Choice offered to / list presented to : Patient [place] ownership interest in [facility].provided to:: Patient    Expected Discharge Plan and Services In-house Referral: Clinical Social Work Discharge Planning Services: NA Post Acute Care Choice: Skilled Nursing Facility Living arrangements for the past 2 months: Independent Living Facility                 DME Arranged: N/A DME Agency: NA                  Prior Living Arrangements/Services Living arrangements for the past 2  months: Independent Living Facility Lives with:: Self Patient language and need for interpreter reviewed:: Yes Do you feel safe going back to the place where you live?: Yes      Need for Family Participation in Patient Care: No (Comment) Care giver support system in place?: Yes (comment) Current home services: DME Criminal Activity/Legal Involvement Pertinent to Current Situation/Hospitalization: No - Comment as needed  Activities of Daily Living Home Assistive Devices/Equipment: Wheelchair, Environmental consultant (specify type) ADL Screening (condition at time of admission) Patient's cognitive ability adequate to safely complete daily activities?: No Is the patient deaf or have difficulty hearing?: No Does the patient have difficulty seeing, even when wearing glasses/contacts?: No Does the patient have difficulty concentrating, remembering, or making decisions?: No Patient able to express need for assistance with ADLs?: Yes Does the patient have difficulty dressing or bathing?: Yes Independently performs ADLs?: No Communication: Independent Dressing (OT): Needs assistance Is this a change from baseline?: Pre-admission baseline Grooming: Needs assistance Is this a change from baseline?: Pre-admission baseline Feeding: Needs assistance Is this a change from baseline?: Change from baseline, expected to last <3 days Bathing: Needs assistance Is this a change from baseline?: Pre-admission baseline Toileting: Needs assistance Is this a change from baseline?: Pre-admission baseline In/Out Bed: Needs assistance Is this a change from baseline?: Pre-admission baseline Walks in Home: Needs assistance Is this a change from baseline?: Pre-admission baseline Does the patient have difficulty walking or climbing stairs?: Yes  Weakness of Legs: Both Weakness of Arms/Hands: Both  Permission Sought/Granted   Permission granted to share information with : No              Emotional Assessment    Attitude/Demeanor/Rapport: Unable to Assess Affect (typically observed): Unable to Assess Orientation: : Oriented to Self, Oriented to Place, Oriented to  Time, Oriented to Situation Alcohol / Substance Use: Not Applicable Psych Involvement: No (comment)  Admission diagnosis:  Cellulitis [L03.90] Cellulitis of multiple sites of buttock [L03.317] AKI (acute kidney injury) (HCC) [N17.9] COVID-19 [U07.1] Patient Active Problem List   Diagnosis Date Noted   Cellulitis 09/08/2022   Moderate nonproliferative diabetic retinopathy of both eyes without macular edema associated with type 2 diabetes mellitus (HCC) 07/18/2021   Benign essential HTN    Primary osteoarthritis of right knee    Slow transit constipation    Labile blood pressure    Controlled type 2 diabetes mellitus with hyperglycemia (HCC)    Labile blood glucose    Acute renal failure superimposed on chronic kidney disease (HCC)    Anxiety state    Nontraumatic acute hemorrhage of basal ganglia (HCC) 01/16/2020   Lethargy    Dysphagia, post-stroke    Leukocytosis    CKD (chronic kidney disease), stage III (HCC) 01/10/2020   Morbid obesity (HCC)    Chronic diastolic congestive heart failure (HCC)    Uncontrolled type 2 diabetes mellitus with hyperglycemia (HCC)    Essential hypertension    FUO (fever of unknown origin)    ICH (intracerebral hemorrhage) (HCC) 01/04/2020   Left epiretinal membrane 12/10/2019   Pseudophakia, both eyes 12/10/2019   DIABETES MELLITUS-TYPE II 06/01/2009   HYPERLIPIDEMIA 06/01/2009   HYPERTENSION 06/01/2009   CHOLELITHIASIS 06/01/2009   PANCREATITIS 06/01/2009   PERSONAL HX COLONIC POLYPS 06/01/2009   PCP:  [name], MD Pharmacy:   [facility],  - [address] [place] [address] Phone: [phone] Fax: [phone]     Social Determinants of Health (SDOH) Social History: SDOH Screenings   Food Insecurity: No Food Insecurity (09/08/2022)   Housing: Low Risk  (09/08/2022)  Transportation Needs: No Transportation Needs (09/08/2022)  Utilities: Not At Risk (09/08/2022)  Depression (PHQ2-9): Low Risk  (03/27/2022)  Financial Resource Strain: Low Risk  (06/14/2022)   Received from [facility]  Physical Activity: Inactive (05/19/2019)   Received from [facility], [facility]  Social Connections: Socially Integrated (12/05/2021)   [facility] from [facility], [facility]  Stress: No Stress Concern Present (12/05/2021)   Received from [facility], [facility]  Tobacco Use: Low Risk  (09/08/2022)   SDOH Interventions:     Readmission Risk Interventions     No data to display

## 2022-09-11 NOTE — Progress Notes (Signed)
Mobility Specialist - Progress Note   09/11/22 1453  Oxygen Therapy  SpO2 96 %  O2 Device Nasal Cannula  Mobility  Activity Transferred from bed to chair  Level of Assistance Maximum assist, patient does 25-49% (+2)  Assistive Device Front wheel walker  Distance Ambulated (ft) 2 ft  Activity Response Tolerated well  Mobility Referral Yes  $Mobility charge 1 Mobility  Mobility Specialist Start Time (ACUTE ONLY) 0224  Mobility Specialist Stop Time (ACUTE ONLY) 0249  Mobility Specialist Time Calculation (min) (ACUTE ONLY) 25 min   Pt received in bed and agreeable to transfer to recliner. Pt required MaxA from STS as well as transferring. Pt required +2 assistance throughout entire session. No complaints during session. SpO2% remained at 96% throughout session. Pt to recliner after session w/ nurse & NT in room.    [facility]

## 2022-09-11 NOTE — H&P (View-Only) (Signed)
Progress Note     Subjective: Patient evaluated with sons at bedside. She reports she is generally sitting all the time currently either in a wheelchair or a lift chair to sleep.   Objective: Vital signs in last 24 hours: Temp:  [97.6 F (36.4 C)-97.8 F (36.6 C)] 97.6 F (36.4 C) (07/15 0507) Pulse Rate:  [58-79] 58 (07/15 0507) Resp:  [18-20] 18 (07/15 0507) BP: (108-134)/(56-114) 108/56 (07/15 0507) SpO2:  [96 %-100 %] 97 % (07/15 0507) Last BM Date : 09/06/22  Intake/Output from previous day: 07/14 0701 - 07/15 0700 In: 720 [P.O.:720] Out: 450 [Urine:450] Intake/Output this shift: No intake/output data recorded.  PE: General: pleasant, WD, obese female who is laying in bed in NAD Heart: regular, rate, and rhythm.  Lungs: On supplemental oxygen 2L via [place].  Respiratory effort nonlabored Abd: soft, NT, ND GU: right gluteal cleft with 3 necrotic appearing lesions that are densely adherent, purulent and malodorous drainage from most medial area, mild surrounding erythema    Lab Results:  Recent Labs    09/10/22 0528 09/11/22 0517  WBC 14.6* 15.5*  HGB 9.5* 9.5*  HCT 30.2* 30.0*  PLT 261 309   BMET Recent Labs    09/10/22 0528 09/11/22 0517  NA 133* 130*  K 4.8 4.2  CL 98 97*  CO2 19* 20*  GLUCOSE 375* 301*  BUN 68* 96*  CREATININE 2.98* 3.38*  CALCIUM 7.7* 7.6*   PT/INR No results for input(s): "LABPROT", "INR" in the last 72 hours. CMP     Component Value Date/Time   NA 130 (L) 09/11/2022 0517   K 4.2 09/11/2022 0517   CL 97 (L) 09/11/2022 0517   CO2 20 (L) 09/11/2022 0517   GLUCOSE 301 (H) 09/11/2022 0517   BUN 96 (H) 09/11/2022 0517   CREATININE 3.38 (H) 09/11/2022 0517   CALCIUM 7.6 (L) 09/11/2022 0517   PROT 5.8 (L) 09/11/2022 0517   ALBUMIN 2.2 (L) 09/11/2022 0517   AST 39 09/11/2022 0517   ALT 31 09/11/2022 0517   ALKPHOS 74 09/11/2022 0517   BILITOT 0.5 09/11/2022 0517   GFRNONAA 13 (L) 09/11/2022 0517   GFRAA 39 (L) 06/11/2019  1436   Lipase     Component Value Date/Time   LIPASE 21 06/11/2019 1436       Studies/Results: DG CHEST PORT 1 VIEW  Result Date: 09/09/2022 CLINICAL DATA:  Dyspnea.  Back pain.  Weakness. EXAM: PORTABLE CHEST 1 VIEW COMPARISON:  09/08/2022 FINDINGS: Atherosclerotic calcification of the aortic arch. Heart size within normal limits for AP projection. Mild atelectasis laterally at the left lung base. Otherwise unremarkable. IMPRESSION: 1. Mild atelectasis laterally at the left lung base. 2.  Aortic Atherosclerosis (ICD10-I70.0). Electronically Signed   By: Gaylyn Rong M.D.   On: 09/09/2022 17:21    Anti-infectives: Anti-infectives (From admission, onward)    Start     Dose/Rate Route Frequency Ordered Stop   09/11/22 0000  vancomycin (VANCOREADY) IVPB 750 mg/150 mL        750 mg 150 mL/hr over 60 Minutes Intravenous Every 48 hours 09/09/22 0010     09/09/22 1200  ceFEPIme (MAXIPIME) 2 g in sodium chloride 0.9 % 100 mL IVPB        2 g 200 mL/hr over 30 Minutes Intravenous Every 24 hours 09/09/22 0010     09/09/22 1000  metroNIDAZOLE (FLAGYL) IVPB 500 mg        500 mg 100 mL/hr over 60 Minutes Intravenous Every 12  hours 09/09/22 0737     09/09/22 0045  vancomycin (VANCOREADY) IVPB 2000 mg/400 mL        2,000 mg 200 mL/hr over 120 Minutes Intravenous  Once 09/08/22 2358 09/09/22 0252   09/08/22 1345  piperacillin-tazobactam (ZOSYN) IVPB 3.375 g        3.375 g 100 mL/hr over 30 Minutes Intravenous  Once 09/08/22 1336 09/08/22 1444        Assessment/Plan  Unstageable Sacral/gluteal wound  - CT 7/12 with above extending into musculature, significant gas and stranding  - wound is not really open on my exam and some necrotic tissue overlying, purulent drainage expressed from most medial eschar which is densely adherent and not able to be removed or debrided at the bedside - WBC 15 from 18 on admit - continue IV abx - this would benefit from debridement in the OR but does not  need to be emergent - can plan for tomorrow  - ECHO pending   FEN: HH/CM diet  VTE: SQH ID: [email or web address]  - per primary -  COVID+ - diagnosed 09/06/22 MRSA bacteremia  AKI on CKD stage IIIb Anemia of chronic disease HTN T2DM Physical deconditioning    LOS: 3 days   I reviewed hospitalist notes, last 24 h vitals and pain scores, last 48 h intake and output, last 24 h labs and trends, and last 24 h imaging results.   Juliet Rude, [facility] Surgery 09/11/2022, 7:56 AM Please see Amion for pager number during day hours 7:00am-4:30pm

## 2022-09-11 NOTE — Plan of Care (Signed)
  Problem: Fluid Volume: Goal: Ability to maintain a balanced intake and output will improve Outcome: Progressing   Problem: Education: Goal: Knowledge of General Education information will improve Description: Including pain rating scale, medication(s)/side effects and non-pharmacologic comfort measures Outcome: Progressing   Problem: Clinical Measurements: Goal: Ability to maintain clinical measurements within normal limits will improve Outcome: Progressing   Problem: Metabolic: Goal: Ability to maintain appropriate glucose levels will improve Outcome: Not Progressing

## 2022-09-12 ENCOUNTER — Inpatient Hospital Stay (HOSPITAL_COMMUNITY): Payer: Medicare Other

## 2022-09-12 ENCOUNTER — Encounter (HOSPITAL_COMMUNITY)
Admission: EM | Disposition: A | Discharge status: Skilled Nursing Facility | Payer: Self-pay | Source: Skilled Nursing Facility | Attending: Family Medicine

## 2022-09-12 ENCOUNTER — Inpatient Hospital Stay (HOSPITAL_COMMUNITY): Payer: Medicare Other | Admitting: Certified Registered Nurse Anesthetist

## 2022-09-12 ENCOUNTER — Encounter (HOSPITAL_COMMUNITY): Payer: Self-pay | Admitting: Family Medicine

## 2022-09-12 ENCOUNTER — Other Ambulatory Visit: Payer: Self-pay

## 2022-09-12 DIAGNOSIS — I5032 Chronic diastolic (congestive) heart failure: Secondary | ICD-10-CM | POA: Diagnosis not present

## 2022-09-12 DIAGNOSIS — R7881 Bacteremia: Secondary | ICD-10-CM

## 2022-09-12 DIAGNOSIS — L89139 Pressure ulcer of right lower back, unspecified stage: Secondary | ICD-10-CM | POA: Diagnosis not present

## 2022-09-12 DIAGNOSIS — I96 Gangrene, not elsewhere classified: Secondary | ICD-10-CM

## 2022-09-12 DIAGNOSIS — I130 Hypertensive heart and chronic kidney disease with heart failure and stage 1 through stage 4 chronic kidney disease, or unspecified chronic kidney disease: Secondary | ICD-10-CM

## 2022-09-12 DIAGNOSIS — L03312 Cellulitis of back [any part except buttock]: Secondary | ICD-10-CM | POA: Diagnosis not present

## 2022-09-12 DIAGNOSIS — N1831 Chronic kidney disease, stage 3a: Secondary | ICD-10-CM

## 2022-09-12 HISTORY — PX: INCISION AND DRAINAGE ABSCESS: SHX5864

## 2022-09-12 LAB — CBC WITH DIFFERENTIAL/PLATELET
Abs Immature Granulocytes: 0.33 10*3/uL — ABNORMAL HIGH (ref 0.00–0.07)
Basophils Absolute: 0 10*3/uL (ref 0.0–0.1)
Basophils Relative: 0 %
Eosinophils Absolute: 0 10*3/uL (ref 0.0–0.5)
Eosinophils Relative: 0 %
HCT: 27.6 % — ABNORMAL LOW (ref 36.0–46.0)
Hemoglobin: 9.2 g/dL — ABNORMAL LOW (ref 12.0–15.0)
Immature Granulocytes: 3 %
Lymphocytes Relative: 5 %
Lymphs Abs: 0.6 10*3/uL — ABNORMAL LOW (ref 0.7–4.0)
MCH: 26.3 pg (ref 26.0–34.0)
MCHC: 33.3 g/dL (ref 30.0–36.0)
MCV: 78.9 fL — ABNORMAL LOW (ref 80.0–100.0)
Monocytes Absolute: 0.7 10*3/uL (ref 0.1–1.0)
Monocytes Relative: 6 %
Neutro Abs: 11.7 10*3/uL — ABNORMAL HIGH (ref 1.7–7.7)
Neutrophils Relative %: 86 %
Platelets: 330 10*3/uL (ref 150–400)
RBC: 3.5 MIL/uL — ABNORMAL LOW (ref 3.87–5.11)
RDW: 15.5 % (ref 11.5–15.5)
WBC: 13.4 10*3/uL — ABNORMAL HIGH (ref 4.0–10.5)
nRBC: 0 % (ref 0.0–0.2)

## 2022-09-12 LAB — BASIC METABOLIC PANEL
Anion gap: 12 (ref 5–15)
BUN: 111 mg/dL — ABNORMAL HIGH (ref 8–23)
CO2: 21 mmol/L — ABNORMAL LOW (ref 22–32)
Calcium: 7.4 mg/dL — ABNORMAL LOW (ref 8.9–10.3)
Chloride: 96 mmol/L — ABNORMAL LOW (ref 98–111)
Creatinine, Ser: 3.57 mg/dL — ABNORMAL HIGH (ref 0.44–1.00)
GFR, Estimated: 12 mL/min — ABNORMAL LOW (ref >60)
Glucose, Bld: 263 mg/dL — ABNORMAL HIGH (ref 70–99)
Potassium: 4.2 mmol/L (ref 3.5–5.1)
Sodium: 129 mmol/L — ABNORMAL LOW (ref 135–145)

## 2022-09-12 LAB — C-REACTIVE PROTEIN: CRP: 9.3 mg/dL — ABNORMAL HIGH (ref <1.0)

## 2022-09-12 LAB — CK: Total CK: 219 U/L (ref 38–234)

## 2022-09-12 LAB — ECHOCARDIOGRAM COMPLETE
Area-P 1/2: 3.3 cm2
Height: 66 in
MV M vel: 4.24 m/s
MV Peak grad: 71.9 mmHg
MV VTI: 1.92 cm2
S' Lateral: 3 cm
Single Plane A4C EF: 51.3 %
Weight: 3573.22 oz

## 2022-09-12 LAB — MAGNESIUM: Magnesium: 2.5 mg/dL — ABNORMAL HIGH (ref 1.7–2.4)

## 2022-09-12 LAB — GLUCOSE, CAPILLARY
Glucose-Capillary: 267 mg/dL — ABNORMAL HIGH (ref 70–99)
Glucose-Capillary: 303 mg/dL — ABNORMAL HIGH (ref 70–99)
Glucose-Capillary: 251 mg/dL — ABNORMAL HIGH (ref 70–99)
Glucose-Capillary: 292 mg/dL — ABNORMAL HIGH (ref 70–99)

## 2022-09-12 SURGERY — INCISION AND DRAINAGE, ABSCESS
Anesthesia: General

## 2022-09-12 MED ORDER — INSULIN GLARGINE-YFGN 100 UNIT/ML ~~LOC~~ SOLN
35.0000 [IU] | Freq: Two times a day (BID) | SUBCUTANEOUS | Status: DC
  Administered 2022-09-12 – 2022-09-13 (×3): 35 [IU] via SUBCUTANEOUS
  Filled 2022-09-12 (×4): qty 0.35

## 2022-09-12 MED ORDER — LACTATED RINGERS IV SOLN: INTRAVENOUS | Status: DC

## 2022-09-12 MED ORDER — ONDANSETRON HCL 4 MG/2ML IJ SOLN
INTRAMUSCULAR | Status: DC | PRN
Start: 2022-09-12 — End: 2022-09-12
  Administered 2022-09-12: 4 mg via INTRAVENOUS

## 2022-09-12 MED ORDER — VANCOMYCIN VARIABLE DOSE PER UNSTABLE RENAL FUNCTION (PHARMACIST DOSING): Status: DC

## 2022-09-12 MED ORDER — OXYCODONE HCL 5 MG PO TABS: 5.0000 mg | ORAL_TABLET | ORAL | Status: DC | PRN

## 2022-09-12 MED ORDER — FENTANYL CITRATE PF 50 MCG/ML IJ SOSY
25.0000 ug | PREFILLED_SYRINGE | INTRAMUSCULAR | Status: DC | PRN
  Administered 2022-09-12: 50 ug via INTRAVENOUS

## 2022-09-12 MED ORDER — FENTANYL CITRATE (PF) 100 MCG/2ML IJ SOLN
INTRAMUSCULAR | Status: AC
  Filled 2022-09-12: qty 2

## 2022-09-12 MED ORDER — CHLORHEXIDINE GLUCONATE CLOTH 2 % EX PADS: 6.0000 | MEDICATED_PAD | Freq: Once | CUTANEOUS | Status: DC

## 2022-09-12 MED ORDER — 0.9 % SODIUM CHLORIDE (POUR BTL) OPTIME
TOPICAL | Status: DC | PRN
  Administered 2022-09-12: 1000 mL

## 2022-09-12 MED ORDER — FENTANYL CITRATE PF 50 MCG/ML IJ SOSY
PREFILLED_SYRINGE | INTRAMUSCULAR | Status: AC
  Administered 2022-09-12: 50 ug via INTRAVENOUS
  Filled 2022-09-12: qty 3

## 2022-09-12 MED ORDER — PROPOFOL 10 MG/ML IV BOLUS
INTRAVENOUS | Status: DC | PRN
  Administered 2022-09-12: 80 mg via INTRAVENOUS
  Administered 2022-09-12: 100 mg via INTRAVENOUS

## 2022-09-12 MED ORDER — ACETAMINOPHEN 10 MG/ML IV SOLN: 1000.0000 mg | Freq: Once | INTRAVENOUS | Status: DC | PRN

## 2022-09-12 MED ORDER — BUPIVACAINE HCL 0.25 % IJ SOLN
INTRAMUSCULAR | Status: AC
  Filled 2022-09-12: qty 1

## 2022-09-12 MED ORDER — EPHEDRINE 5 MG/ML INJ
INTRAVENOUS | Status: AC
  Filled 2022-09-12: qty 5

## 2022-09-12 MED ORDER — CALCIUM POLYCARBOPHIL 625 MG PO TABS
625.0000 mg | ORAL_TABLET | Freq: Two times a day (BID) | ORAL | Status: DC
  Administered 2022-09-12 – 2022-09-22 (×10): 625 mg via ORAL
  Filled 2022-09-12 (×21): qty 1

## 2022-09-12 MED ORDER — DEXAMETHASONE SODIUM PHOSPHATE 10 MG/ML IJ SOLN
INTRAMUSCULAR | Status: AC
  Filled 2022-09-12: qty 1

## 2022-09-12 MED ORDER — ACETAMINOPHEN 500 MG PO TABS: 1000.0000 mg | ORAL_TABLET | ORAL | Status: DC

## 2022-09-12 MED ORDER — METHOCARBAMOL 1000 MG/10ML IJ SOLN: 1000.0000 mg | Freq: Four times a day (QID) | INTRAVENOUS | Status: DC | PRN

## 2022-09-12 MED ORDER — ACETAMINOPHEN 650 MG RE SUPP
650.0000 mg | Freq: Four times a day (QID) | RECTAL | Status: DC | PRN
  Administered 2022-09-16: 650 mg via RECTAL
  Filled 2022-09-12: qty 1

## 2022-09-12 MED ORDER — ONDANSETRON HCL 4 MG/2ML IJ SOLN
INTRAMUSCULAR | Status: AC
  Filled 2022-09-12: qty 2

## 2022-09-12 MED ORDER — SUCCINYLCHOLINE CHLORIDE 200 MG/10ML IV SOSY
PREFILLED_SYRINGE | INTRAVENOUS | Status: DC | PRN
  Administered 2022-09-12: 100 mg via INTRAVENOUS

## 2022-09-12 MED ORDER — ROCURONIUM BROMIDE 10 MG/ML (PF) SYRINGE
PREFILLED_SYRINGE | INTRAVENOUS | Status: AC
  Filled 2022-09-12: qty 10

## 2022-09-12 MED ORDER — PERFLUTREN LIPID MICROSPHERE
1.0000 mL | INTRAVENOUS | Status: AC | PRN
  Administered 2022-09-12: 4 mL via INTRAVENOUS

## 2022-09-12 MED ORDER — METHOCARBAMOL 500 MG PO TABS: 1000.0000 mg | ORAL_TABLET | Freq: Four times a day (QID) | ORAL | Status: DC | PRN

## 2022-09-12 MED ORDER — SODIUM CHLORIDE 0.9 % IV SOLN: INTRAVENOUS | Status: DC

## 2022-09-12 MED ORDER — EPHEDRINE SULFATE (PRESSORS) 50 MG/ML IJ SOLN
INTRAMUSCULAR | Status: DC | PRN
  Administered 2022-09-12: 7.5 mg via INTRAVENOUS

## 2022-09-12 MED ORDER — LIDOCAINE 2% (20 MG/ML) 5 ML SYRINGE
INTRAMUSCULAR | Status: DC | PRN
  Administered 2022-09-12: 40 mg via INTRAVENOUS

## 2022-09-12 MED ORDER — DEXAMETHASONE SODIUM PHOSPHATE 4 MG/ML IJ SOLN
INTRAMUSCULAR | Status: DC | PRN
  Administered 2022-09-12: 5 mg via INTRAVENOUS

## 2022-09-12 MED ORDER — FENTANYL CITRATE (PF) 100 MCG/2ML IJ SOLN
INTRAMUSCULAR | Status: DC | PRN
  Administered 2022-09-12 (×2): 50 ug via INTRAVENOUS

## 2022-09-12 MED ORDER — LIDOCAINE HCL (PF) 2 % IJ SOLN
INTRAMUSCULAR | Status: AC
  Filled 2022-09-12: qty 5

## 2022-09-12 MED ORDER — PROPOFOL 10 MG/ML IV BOLUS
INTRAVENOUS | Status: AC
  Filled 2022-09-12: qty 20

## 2022-09-12 MED ORDER — INSULIN ASPART 100 UNIT/ML IJ SOLN
INTRAMUSCULAR | Status: AC
  Filled 2022-09-12: qty 1

## 2022-09-12 MED ORDER — INSULIN ASPART 100 UNIT/ML IJ SOLN
11.0000 [IU] | Freq: Once | INTRAMUSCULAR | Status: AC
  Administered 2022-09-12: 11 [IU] via SUBCUTANEOUS

## 2022-09-12 SURGICAL SUPPLY — 35 items
BAG COUNTER SPONGE SURGICOUNT (BAG) IMPLANT
BAG SPNG CNTER NS LX DISP (BAG)
BLADE SURG 15 STRL LF DISP TIS (BLADE) ×2 IMPLANT
BLADE SURG 15 STRL SS (BLADE) ×1
BLADE SURG SZ11 CARB STEEL (BLADE) ×2 IMPLANT
BRIEF MESH DISP LRG (UNDERPADS AND DIAPERS) ×2 IMPLANT
CNTNR URN SCR LID CUP LEK RST (MISCELLANEOUS) IMPLANT
CONT SPEC 4OZ STRL OR WHT (MISCELLANEOUS) ×2
COVER SURGICAL LIGHT HANDLE (MISCELLANEOUS) ×2 IMPLANT
DRAPE LAPAROTOMY T 102X78X121 (DRAPES) ×2 IMPLANT
ELECT REM PT RETURN 15FT ADLT (MISCELLANEOUS) ×2 IMPLANT
GAUZE 4X4 16PLY ~~LOC~~+RFID DBL (SPONGE) ×2 IMPLANT
GAUZE PAD ABD 8X10 STRL (GAUZE/BANDAGES/DRESSINGS) ×2 IMPLANT
GAUZE SPONGE 4X4 12PLY STRL (GAUZE/BANDAGES/DRESSINGS) ×2 IMPLANT
GLOVE ECLIPSE 8.0 STRL XLNG CF (GLOVE) ×2 IMPLANT
GLOVE INDICATOR 8.0 STRL GRN (GLOVE) ×2 IMPLANT
GOWN STRL REUS W/ TWL XL LVL3 (GOWN DISPOSABLE) ×6 IMPLANT
GOWN STRL REUS W/TWL XL LVL3 (GOWN DISPOSABLE) ×3
KIT BASIN OR (CUSTOM PROCEDURE TRAY) ×2 IMPLANT
KIT TURNOVER KIT A (KITS) IMPLANT
NDL HYPO 22X1.5 SAFETY MO (MISCELLANEOUS) ×2 IMPLANT
NEEDLE HYPO 22X1.5 SAFETY MO (MISCELLANEOUS) ×1 IMPLANT
PACK BASIC VI WITH GOWN DISP (CUSTOM PROCEDURE TRAY) ×2 IMPLANT
PENCIL SMOKE EVACUATOR (MISCELLANEOUS) IMPLANT
SUCTION TUBE FRAZIER 12FR DISP (SUCTIONS) IMPLANT
SURGILUBE 2OZ TUBE FLIPTOP (MISCELLANEOUS) ×2 IMPLANT
SUT CHROMIC 2 0 SH (SUTURE) IMPLANT
SUT VIC AB 2-0 UR6 27 (SUTURE) IMPLANT
SWAB COLLECTION DEVICE MRSA (MISCELLANEOUS) IMPLANT
SWAB CULTURE ESWAB REG 1ML (MISCELLANEOUS) IMPLANT
SYR 20ML LL LF (SYRINGE) ×2 IMPLANT
SYR 3ML LL SCALE MARK (SYRINGE) IMPLANT
SYR BULB IRRIG 60ML STRL (SYRINGE) IMPLANT
TOWEL OR 17X26 10 PK STRL BLUE (TOWEL DISPOSABLE) ×2 IMPLANT
TOWEL OR NON WOVEN STRL DISP B (DISPOSABLE) ×2 IMPLANT

## 2022-09-12 NOTE — Progress Notes (Signed)
OT Cancellation Note  Patient Details Name: [name] [name] MRN: [id number] DOB: [birth year]   Cancelled Treatment:    Reason Eval/Treat Not Completed: Other (comment): Per family, pt to have surgery in about an hour for wound closure. Pt was offered OT services and pt adamantly refused. Pt became agitated when family asked role of OT and OT answered. Pt needed reassurance that OT was just answering family's questions.  Pt agreeable to "see how tomorrow goes".  Theodoro Clock 09/12/2022, 12:14 PM

## 2022-09-12 NOTE — Progress Notes (Signed)
Pharmacy Antibiotic Note  [name] [name] is a 89 y.o. female admitted on 09/08/2022 with  medical history significant of hypertension, diabetes mellitus type 2, prior stroke, pancreatitis, hyperlipidemia, CKD stage III, and morbid obesity presents to the ER from ALF due to severe weakness and lethargy. Pt with severe cellulitis from possible sacral ulcer Pharmacy has been consulted for vancomycin and cefepime.  Plan: Vancomycin dosing per renal function.   Cefepime 2gm IV q24h Follow renal function and clinical course  Height: 5\' 6"  (167.6 cm) Weight: 101.3 kg (223 lb 5.2 oz) IBW/kg (Calculated) : 59.3  Temp (24hrs), Avg:97.8 F (36.6 C), Min:97.5 F (36.4 C), Max:98.2 F (36.8 C)  Recent Labs  Lab 09/08/22 1234 09/08/22 1323 09/08/22 1605 09/09/22 0529 09/10/22 0528 09/11/22 0517 09/12/22 0458  WBC 18.5*  --   --  16.7* 14.6* 15.5* 13.4*  CREATININE 3.24*  --  2.90* 2.82* 2.98* 3.38* 3.57*  LATICACIDVEN  --  1.8  --   --   --   --   --     Estimated Creatinine Clearance: 13.3 mL/min (A) (by C-G formula based on SCr of 3.57 mg/dL (H)).    Allergies  Allergen Reactions   Amlodipine Other (See Comments)    EDEMA and "Allergic," per MAR   Ace Inhibitors Other (See Comments) and Cough    "Allergic," per MAR   Atorvastatin Other (See Comments)    Muscle & joint aches and "Allergic," per MAR   Canagliflozin Other (See Comments)    INVOKANA = urinary tract infections and "Allergic," per MAR   Clonidine Hcl Other (See Comments)    "Allergic," per MAR   Buprenorphine Other (See Comments)    "Allergic," per MAR   Lisinopril Other (See Comments)    "Allergic," per MAR   Morphine And Codeine Rash and Other (See Comments)    For long periods of time (??) and "Allergic," per MAR    Antimicrobials this admission: 7/12 zosyn x 1 7/13 vanc >> 7/13 cefepime / metronidazole >>     Dose adjustments this admission: 7/17 05:00 random level (2 doses given: 2g on 7/14, 750mg  on  7/14):   Microbiology results: 7/10: +COVID 7/12 BCx: 1/4 MRSE 7/12 MRSA PCR: neg 7/14 BCx2: ngtd  Thank you for allowing pharmacy to be a part of this patient's care.  Lynann Beaver PharmD, BCPS [facility] main pharmacy [phone] 09/12/2022 11:09 AM

## 2022-09-12 NOTE — Progress Notes (Signed)
[facility] Progress Note    Assessment/ Plan:   AKI on CKD3b -followed by Dr. Signe Colt. BL Cr ~1.5-1.7. AKI likely secondary to ATN from sepsis and soft BPs. Potential recovery hindered by the use of lasix in the last 2 days and hyperglycemia. Agree with holding home chlorthalidone, losartan, farxiga, lasix -renal ultrasound without obstruction -repeat UA still pending, messaged RN -Cr worsened. Volume status is reasonable today. Can stop albumin and start LR 75cc/hr x 1 day after surgery -given age/chronic back wound/functional status would recommend against renal replacement therapy if indicated. Discussed with patient and son at the bedside and she is in agreement (it is her preexisting wish to NOT dialysis if indicated given what she saw her husband go through) Appreciate palliative care's assistance -Avoid nephrotoxic medications including NSAIDs and iodinated intravenous contrast exposure unless the latter is absolutely indicated.  Preferred narcotic agents for pain control are hydromorphone, fentanyl, and methadone. Morphine should not be used. Avoid Baclofen and avoid oral sodium phosphate and magnesium citrate based laxatives / bowel preps. Continue strict Input and Output monitoring. Will monitor the patient closely with you and intervene or adjust therapy as indicated by changes in clinical status/labs    Rt buttock cellulitis MRSA bacteremia -on cefazolin, vanc. Per primary. Surgery following, going for debridement now   COVID19 -supportive care per primary service   Uncontrolled DM2 with hyperglycemia -per primary service   Anemia of chronic disease -transfuse prn for Hgb <7. Avoid IV Fe   HTN -BP acceptable   Hyponatremia -component of pseudohypoNa secondary to hyperglycemia and hypoNa secondary to AKI. Isotonic fluids as above   Hypoalbuminemia -push protein  Subjective:   Patient seen and examined preop. Going for surgery now. Son at bedside.  Agitated and sad given news about her kidney function.   Objective:   BP (!) 137/49   Pulse 63   Temp 98.2 F (36.8 C) (Oral)   Resp 18   Ht 5\' 6"  (1.676 m)   Wt 101.3 kg   SpO2 100%   BMI 36.05 kg/m   Intake/Output Summary (Last 24 hours) at 09/12/2022 1436 Last data filed at 09/12/2022 1029 Gross per 24 hour  Intake 480 ml  Output 800 ml  Net -320 ml   Weight change:   Physical Exam: Gen: chronically ill appearing CVS: RRR Resp: normal WOB Abd: obese Ext: trace edema b/l Les Neuro: awake, alert  Imaging: ECHOCARDIOGRAM COMPLETE  Result Date: 09/12/2022    ECHOCARDIOGRAM REPORT   Patient Name:   [name] [name] Date of Exam: 09/12/2022 Medical Rec #:  [id number]        Height:       66.0 in Accession #:    [id number]       Weight:       223.3 lb Date of Birth:  May 17, 1932        BSA:          2.096 m Patient Age:    89 years         BP:           132/66 mmHg Patient Gender: F                HR:           63 bpm. Exam Location:  Inpatient Procedure: 2D Echo, Cardiac Doppler, Color Doppler and Intracardiac            Opacification Agent Indications:    Bacteremia  History:  Patient has prior history of Echocardiogram examinations, most                 recent 01/05/2020. Stroke, Signs/Symptoms:Weakness/Lethargy; Risk                 Factors:Hypertension, Diabetes, Dyslipidemia and Obesity. CKD.  Sonographer:    Milda Smart Referring Phys: [id number] [facility]  Sonographer Comments: Patient is obese. Image acquisition challenging due to patient body habitus and Image acquisition challenging due to respiratory motion. IMPRESSIONS  1. Left ventricular ejection fraction, by estimation, is 60 to 65%. The left ventricle has normal function. The left ventricle has no regional wall motion abnormalities. There is mild concentric left ventricular hypertrophy. Left ventricular diastolic parameters are consistent with Grade II diastolic dysfunction (pseudonormalization).  2. Right  ventricular systolic function is normal. The right ventricular size is normal. There is mildly elevated pulmonary artery systolic pressure. The estimated right ventricular systolic pressure is 39.8 mmHg.  3. Left atrial size was moderately dilated.  4. Right atrial size was mild to moderately dilated.  5. The mitral valve is degenerative. Mild mitral valve regurgitation. No evidence of mitral stenosis. Moderate mitral annular calcification.  6. The aortic valve is tricuspid. There is mild calcification of the aortic valve. Aortic valve regurgitation is trivial. Aortic valve sclerosis/calcification is present, without any evidence of aortic stenosis.  7. The inferior vena cava is dilated in size with >50% respiratory variability, suggesting right atrial pressure of 8 mmHg. Conclusion(s)/Recommendation(s): No evidence of valvular vegetations on this transthoracic echocardiogram. Consider a transesophageal echocardiogram to exclude infective endocarditis if clinically indicated. FINDINGS  Left Ventricle: Left ventricular ejection fraction, by estimation, is 60 to 65%. The left ventricle has normal function. The left ventricle has no regional wall motion abnormalities. Definity contrast agent was given IV to delineate the left ventricular  endocardial borders. The left ventricular internal cavity size was normal in size. There is mild concentric left ventricular hypertrophy. Left ventricular diastolic parameters are consistent with Grade II diastolic dysfunction (pseudonormalization). Right Ventricle: The right ventricular size is normal. No increase in right ventricular wall thickness. Right ventricular systolic function is normal. There is mildly elevated pulmonary artery systolic pressure. The tricuspid regurgitant velocity is 2.82  m/s, and with an assumed right atrial pressure of 8 mmHg, the estimated right ventricular systolic pressure is 39.8 mmHg. Left Atrium: Left atrial size was moderately dilated. Right  Atrium: Right atrial size was mild to moderately dilated. Pericardium: There is no evidence of pericardial effusion. Mitral Valve: The mitral valve is degenerative in appearance. Moderate mitral annular calcification. Mild mitral valve regurgitation. No evidence of mitral valve stenosis. MV peak gradient, 9.7 mmHg. The mean mitral valve gradient is 4.0 mmHg. Tricuspid Valve: The tricuspid valve is normal in structure. Tricuspid valve regurgitation is mild . No evidence of tricuspid stenosis. Aortic Valve: The aortic valve is tricuspid. There is mild calcification of the aortic valve. Aortic valve regurgitation is trivial. Aortic valve sclerosis/calcification is present, without any evidence of aortic stenosis. Pulmonic Valve: The pulmonic valve was not well visualized. Pulmonic valve regurgitation is trivial. No evidence of pulmonic stenosis. Aorta: The aortic root is normal in size and structure. Venous: The inferior vena cava is dilated in size with greater than 50% respiratory variability, suggesting right atrial pressure of 8 mmHg. IAS/Shunts: No atrial level shunt detected by color flow Doppler.  LEFT VENTRICLE PLAX 2D LVIDd:         3.90 cm     Diastology LVIDs:  3.00 cm     LV e' medial:    4.47 cm/s LV PW:         1.10 cm     LV E/e' medial:  38.5 LV IVS:        1.10 cm     LV e' lateral:   5.22 cm/s LVOT diam:     2.00 cm     LV E/e' lateral: 33.0 LV SV:         89 LV SV Index:   42 LVOT Area:     3.14 cm  LV Volumes (MOD) LV vol d, MOD A4C: 63.8 ml LV vol s, MOD A4C: 31.1 ml LV SV MOD A4C:     63.8 ml RIGHT VENTRICLE             IVC RV S prime:     10.40 cm/s  IVC diam: 2.10 cm TAPSE (M-mode): 2.3 cm LEFT ATRIUM             Index        RIGHT ATRIUM           Index LA diam:        4.30 cm 2.05 cm/m   RA Area:     18.70 cm LA Vol (A2C):   93.9 ml 44.80 ml/m  RA Volume:   51.80 ml  24.72 ml/m LA Vol (A4C):   93.2 ml 44.47 ml/m LA Biplane Vol: 95.6 ml 45.62 ml/m  AORTIC VALVE LVOT Vmax:   114.00  cm/s LVOT Vmean:  82.100 cm/s LVOT VTI:    0.282 m  AORTA Ao Root diam: 2.70 cm Ao Asc diam:  3.40 cm MITRAL VALVE                TRICUSPID VALVE MV Area (PHT): 3.30 cm     TR Peak grad:   31.8 mmHg MV Area VTI:   1.92 cm     TR Vmax:        282.00 cm/s MV Peak grad:  9.7 mmHg MV Mean grad:  4.0 mmHg     SHUNTS MV Vmax:       1.56 m/s     Systemic VTI:  0.28 m MV Vmean:      91.5 cm/s    Systemic Diam: 2.00 cm MV Decel Time: 230 msec MR Peak grad: 71.9 mmHg MR Vmax:      424.00 cm/s MV E velocity: 172.00 cm/s MV A velocity: 77.90 cm/s MV E/A ratio:  2.21 Arvilla Meres MD Electronically signed by Arvilla Meres MD Signature Date/Time: 09/12/2022/10:37:29 AM    Final    US RENAL  Result Date: 09/11/2022 CLINICAL DATA:  Acute kidney injury EXAM: RENAL / URINARY TRACT ULTRASOUND COMPLETE COMPARISON:  CT 09/08/2022 FINDINGS: Right Kidney: Renal measurements: 11 x 4.3 x 4.5 cm = volume: 112 mL. Diffusely increased parenchymal echotexture suggesting fatty infiltration. No hydronephrosis. Two cysts demonstrated in the upper pole, largest measuring 2 cm diameter. No imaging follow-up is indicated. No solid mass lesions. Left Kidney: Renal measurements: 10.1 x 6.3 x 5.4 cm = volume: 178 mL. Diffusely increased parenchymal echotexture suggesting fatty infiltration. No hydronephrosis. No solid mass. Bladder: Appears normal for degree of bladder distention. Other: None. IMPRESSION: 1. Increased renal parenchymal echotexture bilaterally suggesting chronic medical renal disease. 2. No hydronephrosis. Electronically Signed   By: Burman Nieves M.D.   On: 09/11/2022 21:50    Labs: BMET Recent Labs  Lab 09/08/22 1234 09/08/22 1605 09/09/22 0529 09/10/22 [id number]  09/11/22 0517 09/12/22 0458  NA 130* 132* 131* 133* 130* 129*  K 4.4 4.3 4.4 4.8 4.2 4.2  CL 95* 97* 96* 98 97* 96*  CO2 21* 22 21* 19* 20* 21*  GLUCOSE 176* 206* 259* 375* 301* 263*  BUN 51* 50* 55* 68* 96* 111*  CREATININE 3.24* 2.90* 2.82* 2.98*  3.38* 3.57*  CALCIUM 7.4* 7.4* 7.3* 7.7* 7.6* 7.4*   CBC Recent Labs  Lab 09/08/22 1234 09/09/22 0529 09/10/22 0528 09/11/22 0517 09/12/22 0458  WBC 18.5* 16.7* 14.6* 15.5* 13.4*  NEUTROABS 15.6*  --  13.6* 13.8* 11.7*  HGB 10.5* 9.8* 9.5* 9.5* 9.2*  HCT 33.3* 31.5* 30.2* 30.0* 27.6*  MCV 82.2 83.8 82.3 81.5 78.9*  PLT 240 238 261 309 330    Medications:     acetaminophen  1,000 mg Oral On Call to OR   Chlorhexidine Gluconate Cloth  6 each Topical Once   [MAR Hold] heparin injection (subcutaneous)  5,000 Units Subcutaneous Q8H   [MAR Hold] hydrALAZINE  100 mg Oral TID   [MAR Hold] insulin aspart  0-20 Units Subcutaneous TID WC   [MAR Hold] insulin aspart  0-5 Units Subcutaneous QHS   [MAR Hold] insulin aspart  20 Units Subcutaneous TID WC   [MAR Hold] insulin glargine-yfgn  35 Units Subcutaneous BID   [MAR Hold] ipratropium-albuterol  3 mL Nebulization BID   [MAR Hold] metoprolol tartrate  100 mg Oral BID   [MAR Hold] vancomycin variable dose per unstable renal function (pharmacist dosing)   Does not apply See admin instructions      Anthony Sar, MD [facility] 09/12/2022, 2:36 PM

## 2022-09-12 NOTE — Anesthesia Procedure Notes (Signed)
Procedure Name: Intubation Date/Time: 09/12/2022 2:34 PM  Performed by: Vanessa [place], CRNAPre-anesthesia Checklist: Patient identified, Emergency Drugs available, Suction available and Patient being monitored Patient Re-evaluated:Patient Re-evaluated prior to induction Oxygen Delivery Method: Circle system utilized Preoxygenation: Pre-oxygenation with 100% oxygen Induction Type: IV induction Laryngoscope Size: 2 and Miller Grade View: Grade II Tube type: Oral Tube size: 7.0 mm Number of attempts: 1 Airway Equipment and Method: Stylet Placement Confirmation: ETT inserted through vocal cords under direct vision, positive ETCO2 and breath sounds checked- equal and bilateral Secured at: 22 cm Tube secured with: Tape Dental Injury: Teeth and Oropharynx as per pre-operative assessment

## 2022-09-12 NOTE — Interval H&P Note (Signed)
History and Physical Interval Note:  09/12/2022 12:05 PM  [name] [name]  has presented today for surgery, with the diagnosis of UNSTAGEABLE GLUTEAL PRESSURE WOUND.  The various methods of treatment have been discussed with the patient and family. After consideration of risks, benefits and other options for treatment, the patient has consented to  Procedure(s): INCISION AND DRAINAGE ABSCESS (N/A) as a surgical intervention.  The patient's history has been reviewed, patient examined, no change in status, stable for surgery.  I have reviewed the patient's chart and labs.  Questions were answered to the patient's satisfaction.    The anatomy and physiology of the region was discussed. The pathophysiology of subcutaneous abscess formation with progression to fasciitis & sepsis was discussed.  Need for incision, drainage, debridement discussed.  I stressed good hygiene & need for repeated wound care.  Possible redebridement / reoperation was discussed as well. Possibility of recurrence was discussed.   Risks of bleeding, infection, abscess, leak, injury to other organs, need for repair of tissues / organs, need for further treatment, heart attack, death, and other risks were discussed.  Benefits, alternatives were discussed. I noted a good likelihood this will help address the problem.  Questions answered.  The patient agrees to proceed.   I have re-reviewed the the patient's records, history, medications, and allergies.  I have re-examined the patient.  I again discussed intraoperative plans and goals of post-operative recovery to patient & her family.  They agree to proceed.  KENDRICK REMIGIO  11/04/1932 [id number]  Patient Care Team: [name], MD as PCP - General (Family Medicine)  Patient Active Problem List   Diagnosis Date Noted   Cellulitis 09/08/2022   Moderate nonproliferative diabetic retinopathy of both eyes without macular edema associated with type 2 diabetes mellitus (HCC)  07/18/2021   Benign essential HTN    Primary osteoarthritis of right knee    Slow transit constipation    Labile blood pressure    Controlled type 2 diabetes mellitus with hyperglycemia (HCC)    Labile blood glucose    Acute renal failure superimposed on chronic kidney disease (HCC)    Anxiety state    Nontraumatic acute hemorrhage of basal ganglia (HCC) 01/16/2020   Lethargy    Dysphagia, post-stroke    Leukocytosis    CKD (chronic kidney disease), stage III (HCC) 01/10/2020   Morbid obesity (HCC)    Chronic diastolic congestive heart failure (HCC)    Uncontrolled type 2 diabetes mellitus with hyperglycemia (HCC)    Essential hypertension    FUO (fever of unknown origin)    ICH (intracerebral hemorrhage) (HCC) 01/04/2020   Left epiretinal membrane 12/10/2019   Pseudophakia, both eyes 12/10/2019   DIABETES MELLITUS-TYPE II 06/01/2009   HYPERLIPIDEMIA 06/01/2009   HYPERTENSION 06/01/2009   CHOLELITHIASIS 06/01/2009   PANCREATITIS 06/01/2009   PERSONAL HX COLONIC POLYPS 06/01/2009    Past Medical History:  Diagnosis Date   Colon polyp    Diabetes mellitus 1992   Hypertension 1996   Pancreatitis     Past Surgical History:  Procedure Laterality Date   CHOLECYSTECTOMY     COLONOSCOPY     ERCP     PARTIAL HYSTERECTOMY  89years old   POLYPECTOMY     UPPER GASTROINTESTINAL ENDOSCOPY     WRIST FRACTURE SURGERY  left arm,89years old    Social History   Socioeconomic History   Marital status: Widowed    Spouse name: Not on file   Number of children: Not on file  Years of education: Not on file   Highest education level: Not on file  Occupational History   Not on file  Tobacco Use   Smoking status: Never   Smokeless tobacco: Never  Vaping Use   Vaping status: Never Used  Substance and Sexual Activity   Alcohol use: Never   Drug use: Never   Sexual activity: Not on file  Other Topics Concern   Not on file  Social History Narrative   Not on file   Social  Determinants of Health   Financial Resource Strain: Low Risk  (06/14/2022)   Received from [facility]   Overall Financial Resource Strain (CARDIA)    Difficulty of Paying Living Expenses: Not hard at all  Food Insecurity: No Food Insecurity (09/08/2022)   Hunger Vital Sign    Worried About Running Out of Food in the Last Year: Never true    Ran Out of Food in the Last Year: Never true  Transportation Needs: No Transportation Needs (09/08/2022)   PRAPARE - Administrator, Civil Service (Medical): No    Lack of Transportation (Non-Medical): No  Physical Activity: Inactive (05/19/2019)   Received from [facility]   Exercise Vital Sign    Days of Exercise per Week: 0 days    Minutes of Exercise per Session: 0 min  Stress: No Stress Concern Present (12/05/2021)   Received from [facility] - Occupational Stress Questionnaire    Feeling of Stress : Not at all  Social Connections: Socially Integrated (12/05/2021)   Received from [facility]   Social Network    How would you rate your social network (family, work, friends)?: Good participation with social networks  Intimate Partner Violence: Not At Risk (09/08/2022)   Humiliation, Afraid, Rape, and Kick questionnaire    Fear of Current or Ex-Partner: No    Emotionally Abused: No    Physically Abused: No    Sexually Abused: No    History reviewed. No pertinent family history.  Medications Prior to Admission  Medication Sig Dispense Refill Last Dose   Acetaminophen 500 MG capsule Take 500 mg by mouth every 6 (six) hours as needed (for mild pain).   unk   albuterol (VENTOLIN HFA) 108 (90 Base) MCG/ACT inhaler Inhale 2 puffs into the lungs every 6 (six) hours as needed for wheezing or shortness of breath.   unk   amantadine (SYMMETREL) 100 MG capsule Take 1 capsule (100 mg total) by mouth daily. 30 capsule 0 09/08/2022 at  0800   ASPIRIN 81 PO Take 81 mg by mouth in the morning.   09/08/2022 at 0800   atorvastatin (LIPITOR) 40 MG tablet Take 1 tablet (40 mg total) by mouth daily. 90 tablet 0 09/07/2022 at 2000   chlorthalidone (HYGROTON) 25 MG tablet Take 1.5 tablets (37.5 mg total) by mouth daily. 30 tablet 0 09/08/2022 at 0800   Continuous Blood Gluc Sensor (DEXCOM G6 SENSOR) MISC 1 each by Does not apply route daily. (Patient taking differently: Inject 1 Device into the skin See admin instructions. Place 1 new device into the skin every 10 days) 1 each 3 unk   Diclofenac Sodium (ASPERCREME ARTHRITIS PAIN EX) Apply 1 application  topically every 12 (twelve) hours as needed (for pain- affected area of the left arm).   unk   diclofenac Sodium (VOLTAREN ARTHRITIS PAIN) 1 % GEL Apply 2 g topically 4 (four) times daily. (  Patient taking differently: Apply 4 g topically See admin instructions. Apply 4 grams to the right knee four times a day) 2 g 3 09/07/2022 at 2030   furosemide (LASIX) 20 MG tablet Take 0.5 tablets (10 mg total) by mouth See admin instructions. (Patient taking differently: Take 20 mg by mouth in the morning.) 30 tablet 0 09/08/2022 at 0800   hydrALAZINE (APRESOLINE) 100 MG tablet Take 1 tablet (100 mg total) by mouth 3 (three) times daily. 90 tablet 0 09/08/2022 at 0800   insulin lispro (HUMALOG) 100 UNIT/ML KwikPen Inject 38-50 Units into the skin See admin instructions. Inject 38-50 units into the skin three times a day before meals, per sliding scale: BGL 70-199 = 38 units; 200-299 = 42 units; 300-399 = 46 units; 400 or greater = 50 units and FAX/CALL MD   09/08/2022 at 1130   LANTUS SOLOSTAR 100 UNIT/ML Solostar Pen Inject 34 Units into the skin at bedtime.   09/07/2022 at 2000   lidocaine (LIDODERM) 5 % APPLY (1) PATCH TO LOWER BACK AND RIGHT KNEE ONCE DAILY. LEAVE ON 12 HOURS. LEAVE OFF 12 HOURS. (Patient taking differently: Place 1 patch onto the skin See admin instructions. Apply 1 patch to the lower back and  right knee once a day- on 12 hours/off 12 hours) 30 patch 11 09/07/2022 at 0800   Lidocaine HCl (ASPERCREME LIDOCAINE) 4 % CREA Apply 1 application  topically every 6 (six) hours as needed (for pain- right knee).   unk   Liniments (BLUE-EMU SUPER STRENGTH EX) Apply 1 application  topically every 6 (six) hours as needed (for pain- right knee).   unk   loratadine (CLARITIN) 10 MG tablet Take 10 mg by mouth in the morning.   09/08/2022 at 0800   magnesium oxide (MAG-OX) 400 MG tablet Take 400 mg by mouth in the morning.   09/08/2022 at 0800   metoprolol tartrate (LOPRESSOR) 100 MG tablet Take 1 tablet (100 mg total) by mouth 2 (two) times daily. (Patient taking differently: Take 100 mg by mouth in the morning and at bedtime.) 60 tablet 0 09/07/2022 at 2000   miconazole (BAZA ANTIFUNGAL) 2 % cream Apply topically 2 (two) times daily. (Patient taking differently: Apply 1 Application topically See admin instructions. Apply to affected area of the right big toe 2 times a day) 28.35 g 3 unk   Neo-Bacit-Poly-Lidocaine (FIRST AID ANTIBIOTIC) 4 % OINT Apply 1 application  topically See admin instructions. Apply to affected area of the buttocks (topically) at bedtime- then cover with a clean bandage   09/07/2022 at 2000   [EXPIRED] Nirmatrelvir-Ritonavir (PAXLOVID, 150/100, PO) Take 1-2 tablets by mouth See admin instructions. Take one dose (2 tablets of nirmatrelvir and 1 tablet of ritonavir) twice a day for 5 days   09/08/2022 at 0800   pantoprazole (PROTONIX) 40 MG tablet Take 1 tablet (40 mg total) by mouth daily. 30 tablet 0 09/08/2022 at 0800   polyethylene glycol (MIRALAX / GLYCOLAX) 17 g packet Take 17 g by mouth 2 (two) times daily. (Patient taking differently: Take 17 g by mouth 2 (two) times daily as needed for mild constipation or moderate constipation.) 60 each 0 unk   potassium chloride SA (KLOR-CON M) 20 MEQ tablet Take 20 mEq by mouth 2 (two) times daily.   09/07/2022 at 2000   QUEtiapine (SEROQUEL) 25 MG  tablet Take 0.5 tablets (12.5 mg total) by mouth at bedtime as needed (anxiety). 15 tablet 0 unk   senna-docusate (SENOKOT-S) 8.6-50 MG tablet  Take 1 tablet by mouth 2 (two) times daily. 60 tablet 0 09/08/2022 at 0800   sulfamethoxazole-trimethoprim (BACTRIM DS) 800-160 MG tablet Take 2 tablets by mouth in the morning and at bedtime.   09/08/2022 at 0800   triamcinolone cream (KENALOG) 0.1 % Apply 1 Application topically 2 (two) times daily as needed (for rashes or irritation- affected areas).   unk   ACCU-CHEK GUIDE test strip       benzonatate (TESSALON) 100 MG capsule Take 1 capsule (100 mg total) by mouth every 8 (eight) hours. (Patient not taking: Reported on 09/08/2022) 21 capsule 0 Not Taking   Continuous Blood Gluc Receiver (DEXCOM G6 RECEIVER) DEVI 1 each by Does not apply route daily. 1 each 3    Continuous Blood Gluc Transmit (DEXCOM G6 TRANSMITTER) MISC 1 each by Does not apply route daily. 1 each 0    DROPSAFE SAFETY PEN NEEDLES 31G X 6 MM MISC       Insulin Syringe-Needle U-100 31G X 5/16" 1 ML MISC 1 application by Does not apply route 2 (two) times daily. 100 each 0     Current Facility-Administered Medications  Medication Dose Route Frequency Provider Last Rate Last Admin   acetaminophen (TYLENOL) tablet 650 mg  650 mg Oral Q6H PRN Glade Lloyd, MD   650 mg at 09/11/22 1454   albuterol (PROVENTIL) (2.5 MG/3ML) 0.083% nebulizer solution 3 mL  3 mL Inhalation Q2H PRN Alekh, Kshitiz, MD       ceFEPIme (MAXIPIME) 2 g in sodium chloride 0.9 % 100 mL IVPB  2 g Intravenous Q24H Maurice March, RPH 200 mL/hr at 09/11/22 1230 2 g at 09/11/22 1230   guaiFENesin-dextromethorphan (ROBITUSSIN DM) 100-10 MG/5ML syrup 5 mL  5 mL Oral Q4H PRN Glade Lloyd, MD   5 mL at 09/09/22 1238   heparin injection 5,000 Units  5,000 Units Subcutaneous Q8H Glade Lloyd, MD   5,000 Units at 09/12/22 0555   hydrALAZINE (APRESOLINE) tablet 100 mg  100 mg Oral TID Randel Pigg, Dorma Russell, MD   100 mg at  09/11/22 2153   HYDROcodone-acetaminophen (NORCO/VICODIN) 5-325 MG per tablet 1-2 tablet  1-2 tablet Oral Q4H PRN Randel Pigg, Dorma Russell, MD       insulin aspart (novoLOG) injection 0-20 Units  0-20 Units Subcutaneous TID WC Glade Lloyd, MD   6 Units at 09/12/22 0831   insulin aspart (novoLOG) injection 0-5 Units  0-5 Units Subcutaneous QHS Randel Pigg, Dorma Russell, MD   2 Units at 09/11/22 2152   insulin aspart (novoLOG) injection 20 Units  20 Units Subcutaneous TID WC Glade Lloyd, MD   20 Units at 09/11/22 1627   insulin glargine-yfgn (SEMGLEE) injection 35 Units  35 Units Subcutaneous BID Glade Lloyd, MD   35 Units at 09/12/22 1105   ipratropium-albuterol (DUONEB) 0.5-2.5 (3) MG/3ML nebulizer solution 3 mL  3 mL Nebulization BID Hanley Ben, Kshitiz, MD   3 mL at 09/12/22 0800   metoprolol tartrate (LOPRESSOR) injection 5 mg  5 mg Intravenous Q8H PRN Randel Pigg, Dorma Russell, MD       metoprolol tartrate (LOPRESSOR) tablet 100 mg  100 mg Oral BID Randel Pigg, Dorma Russell, MD   100 mg at 09/11/22 2153   metroNIDAZOLE (FLAGYL) IVPB 500 mg  500 mg Intravenous Q12H Alekh, Kshitiz, MD 100 mL/hr at 09/12/22 1127 500 mg at 09/12/22 1127   ondansetron (ZOFRAN) tablet 4 mg  4 mg Oral Q6H PRN Lenox Ponds, MD       Or   ondansetron (  ZOFRAN) injection 4 mg  4 mg Intravenous Q6H PRN Randel Pigg, Dorma Russell, MD       Oral care mouth rinse  15 mL Mouth Rinse PRN Randel Pigg, Dorma Russell, MD       perflutren lipid microspheres (DEFINITY) IV suspension  1-10 mL Intravenous PRN Weston Brass A, MD   4 mL at 09/12/22 1012   polyethylene glycol (MIRALAX / GLYCOLAX) packet 17 g  17 g Oral Daily PRN Randel Pigg, Dorma Russell, MD       QUEtiapine (SEROQUEL) tablet 12.5 mg  12.5 mg Oral QHS PRN Glade Lloyd, MD   12.5 mg at 09/11/22 2153   vancomycin variable dose per unstable renal function (pharmacist dosing)   Does not apply See admin instructions Shade, Jacqulyn Cane, RPH         Allergies  Allergen Reactions   Amlodipine Other  (See Comments)    EDEMA and "Allergic," per MAR   Ace Inhibitors Other (See Comments) and Cough    "Allergic," per [facility]   Atorvastatin Other (See Comments)    Muscle & joint aches and "Allergic," per MAR   Canagliflozin Other (See Comments)    INVOKANA = urinary tract infections and "Allergic," per MAR   Clonidine Hcl Other (See Comments)    "Allergic," per [facility]   Buprenorphine Other (See Comments)    "Allergic," per MAR   Lisinopril Other (See Comments)    "Allergic," per MAR   Morphine And Codeine Rash and Other (See Comments)    For long periods of time (??) and "Allergic," per MAR    BP (!) 137/49   Pulse 63   Temp 98.2 F (36.8 C) (Oral)   Resp 18   Ht 5\' 6"  (1.676 m)   Wt 101.3 kg   SpO2 100%   BMI 36.05 kg/m   Labs: Results for orders placed or performed during the hospital encounter of 09/08/22 (from the past 48 hour(s))  Glucose, capillary     Status: Abnormal   Collection Time: 09/10/22 12:53 PM  Result Value Ref Range   Glucose-Capillary 399 (H) 70 - 99 mg/dL    Comment: Glucose reference range applies only to samples taken after fasting for at least 8 hours.  Glucose, capillary     Status: Abnormal   Collection Time: 09/10/22  5:09 PM  Result Value Ref Range   Glucose-Capillary 302 (H) 70 - 99 mg/dL    Comment: Glucose reference range applies only to samples taken after fasting for at least 8 hours.  Glucose, capillary     Status: Abnormal   Collection Time: 09/10/22  8:34 PM  Result Value Ref Range   Glucose-Capillary 316 (H) 70 - 99 mg/dL    Comment: Glucose reference range applies only to samples taken after fasting for at least 8 hours.  CBC with Differential/Platelet     Status: Abnormal   Collection Time: 09/11/22  5:17 AM  Result Value Ref Range   WBC 15.5 (H) 4.0 - 10.5 K/uL   RBC 3.68 (L) 3.87 - 5.11 MIL/uL   Hemoglobin 9.5 (L) 12.0 - 15.0 g/dL   HCT 29.5 (L) 62.1 - 30.8 %   MCV 81.5 80.0 - 100.0 fL   MCH 25.8 (L) 26.0 - 34.0 pg   MCHC 31.7  30.0 - 36.0 g/dL   RDW 65.7 84.6 - 96.2 %   Platelets 309 150 - 400 K/uL   nRBC 0.0 0.0 - 0.2 %   Neutrophils Relative % 89 %   Neutro  Abs 13.8 (H) 1.7 - 7.7 K/uL   Lymphocytes Relative 5 %   Lymphs Abs 0.7 0.7 - 4.0 K/uL   Monocytes Relative 5 %   Monocytes Absolute 0.7 0.1 - 1.0 K/uL   Eosinophils Relative 0 %   Eosinophils Absolute 0.0 0.0 - 0.5 K/uL   Basophils Relative 0 %   Basophils Absolute 0.0 0.0 - 0.1 K/uL   Immature Granulocytes 1 %   Abs Immature Granulocytes 0.22 (H) 0.00 - 0.07 K/uL    Comment: Performed at [facility], [address], [place], [place] [address]  Comprehensive metabolic panel     Status: Abnormal   Collection Time: 09/11/22  5:17 AM  Result Value Ref Range   Sodium 130 (L) 135 - 145 mmol/L   Potassium 4.2 3.5 - 5.1 mmol/L   Chloride 97 (L) 98 - 111 mmol/L   CO2 20 (L) 22 - 32 mmol/L   Glucose, Bld 301 (H) 70 - 99 mg/dL    Comment: Glucose reference range applies only to samples taken after fasting for at least 8 hours.   BUN 96 (H) 8 - 23 mg/dL   Creatinine, Ser 6.04 (H) 0.44 - 1.00 mg/dL   Calcium 7.6 (L) 8.9 - 10.3 mg/dL   Total Protein 5.8 (L) 6.5 - 8.1 g/dL   Albumin 2.2 (L) 3.5 - 5.0 g/dL   AST 39 15 - 41 U/L   ALT 31 0 - 44 U/L   Alkaline Phosphatase 74 38 - 126 U/L   Total Bilirubin 0.5 0.3 - 1.2 mg/dL   GFR, Estimated 13 (L) >60 mL/min    Comment: (NOTE) Calculated using the CKD-EPI Creatinine Equation (2021)    Anion gap 13 5 - 15    Comment: Performed at [facility], [address] [place], [place] [address]  Magnesium     Status: None   Collection Time: 09/11/22  5:17 AM  Result Value Ref Range   Magnesium 2.3 1.7 - 2.4 mg/dL    Comment: Performed at [facility], [address] [place], [place] [address]  C-reactive protein     Status: Abnormal   Collection Time: 09/11/22  5:17 AM  Result Value Ref Range   CRP 17.8 (H) <1.0 mg/dL    Comment: Performed at [facility], [address] [place], [place] [address]  Glucose, capillary     Status: Abnormal   Collection Time: 09/11/22  8:15 AM  Result Value Ref Range   Glucose-Capillary 316 (H) 70 - 99 mg/dL    Comment: Glucose reference range applies only to samples taken after fasting for at least 8 hours.  Glucose, capillary     Status: Abnormal   Collection Time: 09/11/22 12:14 PM  Result Value Ref Range   Glucose-Capillary 350 (H) 70 - 99 mg/dL    Comment: Glucose reference range applies only to samples taken after fasting for at least 8 hours.  Glucose, capillary     Status: Abnormal   Collection Time: 09/11/22  4:26 PM  Result Value Ref Range   Glucose-Capillary 271 (H) 70 - 99 mg/dL    Comment: Glucose reference range applies only to samples taken after fasting for at least 8 hours.  Glucose, capillary     Status: Abnormal   Collection Time: 09/11/22  9:37 PM  Result Value Ref Range   Glucose-Capillary 246 (H) 70 - 99 mg/dL    Comment: Glucose reference range applies only to samples taken  after fasting for at least 8 hours.  CBC with Differential/Platelet     Status: Abnormal   Collection Time: 09/12/22  4:58 AM  Result Value Ref Range   WBC 13.4 (H) 4.0 - 10.5 K/uL   RBC 3.50 (L) 3.87 - 5.11 MIL/uL   Hemoglobin 9.2 (L) 12.0 - 15.0 g/dL   HCT 16.1 (L) 09.6 - 04.5 %   MCV 78.9 (L) 80.0 - 100.0 fL   MCH 26.3 26.0 - 34.0 pg   MCHC 33.3 30.0 - 36.0 g/dL   RDW 40.9 81.1 - 91.4 %   Platelets 330 150 - 400 K/uL   nRBC 0.0 0.0 - 0.2 %   Neutrophils Relative % 86 %   Neutro Abs 11.7 (H) 1.7 - 7.7 K/uL   Lymphocytes Relative 5 %   Lymphs Abs 0.6 (L) 0.7 - 4.0 K/uL   Monocytes Relative 6 %   Monocytes Absolute 0.7 0.1 - 1.0 K/uL   Eosinophils Relative 0 %   Eosinophils Absolute 0.0 0.0 - 0.5 K/uL   Basophils Relative 0 %   Basophils Absolute 0.0 0.0 - 0.1 K/uL   Immature Granulocytes 3 %   Abs Immature Granulocytes 0.33 (H) 0.00 - 0.07 K/uL    Comment: Performed at [facility], [address], [place], [place] [address]  Basic metabolic panel     Status: Abnormal   Collection Time: 09/12/22  4:58 AM  Result Value Ref Range   Sodium 129 (L) 135 - 145 mmol/L   Potassium 4.2 3.5 - 5.1 mmol/L   Chloride 96 (L) 98 - 111 mmol/L   CO2 21 (L) 22 - 32 mmol/L   Glucose, Bld 263 (H) 70 - 99 mg/dL    Comment: Glucose reference range applies only to samples taken after fasting for at least 8 hours.   BUN 111 (H) 8 - 23 mg/dL    Comment: RESULT CONFIRMED BY MANUAL DILUTION   Creatinine, Ser 3.57 (H) 0.44 - 1.00 mg/dL   Calcium 7.4 (L) 8.9 - 10.3 mg/dL   GFR, Estimated 12 (L) >60 mL/min    Comment: (NOTE) Calculated using the CKD-EPI Creatinine Equation (2021)    Anion gap 12 5 - 15    Comment: Performed at [facility], [address], [place], [place] [address]  Magnesium     Status: Abnormal   Collection Time: 09/12/22  4:58 AM  Result Value Ref Range   Magnesium 2.5 (H) 1.7 - 2.4 mg/dL    Comment: Performed at [facility], [address] [place], [place] [address]  C-reactive protein     Status: Abnormal   Collection Time: 09/12/22  4:58 AM  Result Value Ref Range   CRP 9.3 (H) <1.0 mg/dL    Comment: Performed at [facility], [address] [place], [place] [address]  CK     Status: None   Collection Time: 09/12/22  4:58 AM  Result Value Ref Range   Total CK 219 38 - 234 U/L    Comment: Performed at [facility], [address], [place], [place] [address]  Glucose, capillary     Status: Abnormal   Collection Time: 09/12/22  7:40 AM  Result Value Ref Range   Glucose-Capillary 267 (H) 70 - 99 mg/dL    Comment: Glucose reference range applies only to samples taken after fasting for at least 8 hours.  Glucose, capillary     Status: Abnormal   Collection  Time: 09/12/22 11:51 AM  Result Value Ref Range   Glucose-Capillary 303 (H) 70 - 99 mg/dL    Comment: Glucose reference  range applies only to samples taken after fasting for at least 8 hours.    Imaging / Studies: ECHOCARDIOGRAM COMPLETE  Result Date: 09/12/2022    ECHOCARDIOGRAM REPORT   Patient Name:   RUFUS CYPERT Date of Exam: 09/12/2022 Medical Rec #:  [id number]        Height:       66.0 in Accession #:    [id number]       Weight:       223.3 lb Date of Birth:  12/27/1932        BSA:          2.096 m Patient Age:    89 years         BP:           132/66 mmHg Patient Gender: F                HR:           63 bpm. Exam Location:  Inpatient Procedure: 2D Echo, Cardiac Doppler, Color Doppler and Intracardiac            Opacification Agent Indications:    Bacteremia  History:        Patient has prior history of Echocardiogram examinations, most                 recent 01/05/2020. Stroke, Signs/Symptoms:Weakness/Lethargy; Risk                 Factors:Hypertension, Diabetes, Dyslipidemia and Obesity. CKD.  Sonographer:    Milda Smart Referring Phys: [id number] [facility]  Sonographer Comments: Patient is obese. Image acquisition challenging due to patient body habitus and Image acquisition challenging due to respiratory motion. IMPRESSIONS  1. Left ventricular ejection fraction, by estimation, is 60 to 65%. The left ventricle has normal function. The left ventricle has no regional wall motion abnormalities. There is mild concentric left ventricular hypertrophy. Left ventricular diastolic parameters are consistent with Grade II diastolic dysfunction (pseudonormalization).  2. Right ventricular systolic function is normal. The right ventricular size is normal. There is mildly elevated pulmonary artery systolic pressure. The estimated right ventricular systolic pressure is 39.8 mmHg.  3. Left atrial size was moderately dilated.  4. Right atrial size was mild to moderately dilated.  5. The mitral valve is degenerative. Mild mitral valve regurgitation. No evidence of mitral stenosis. Moderate mitral annular calcification.  6. The  aortic valve is tricuspid. There is mild calcification of the aortic valve. Aortic valve regurgitation is trivial. Aortic valve sclerosis/calcification is present, without any evidence of aortic stenosis.  7. The inferior vena cava is dilated in size with >50% respiratory variability, suggesting right atrial pressure of 8 mmHg. Conclusion(s)/Recommendation(s): No evidence of valvular vegetations on this transthoracic echocardiogram. Consider a transesophageal echocardiogram to exclude infective endocarditis if clinically indicated. FINDINGS  Left Ventricle: Left ventricular ejection fraction, by estimation, is 60 to 65%. The left ventricle has normal function. The left ventricle has no regional wall motion abnormalities. Definity contrast agent was given IV to delineate the left ventricular  endocardial borders. The left ventricular internal cavity size was normal in size. There is mild concentric left ventricular hypertrophy. Left ventricular diastolic parameters are consistent with Grade II diastolic dysfunction (pseudonormalization). Right Ventricle: The right ventricular size is normal. No increase in right ventricular wall thickness. Right ventricular systolic function is  normal. There is mildly elevated pulmonary artery systolic pressure. The tricuspid regurgitant velocity is 2.82  m/s, and with an assumed right atrial pressure of 8 mmHg, the estimated right ventricular systolic pressure is 39.8 mmHg. Left Atrium: Left atrial size was moderately dilated. Right Atrium: Right atrial size was mild to moderately dilated. Pericardium: There is no evidence of pericardial effusion. Mitral Valve: The mitral valve is degenerative in appearance. Moderate mitral annular calcification. Mild mitral valve regurgitation. No evidence of mitral valve stenosis. MV peak gradient, 9.7 mmHg. The mean mitral valve gradient is 4.0 mmHg. Tricuspid Valve: The tricuspid valve is normal in structure. Tricuspid valve regurgitation is  mild . No evidence of tricuspid stenosis. Aortic Valve: The aortic valve is tricuspid. There is mild calcification of the aortic valve. Aortic valve regurgitation is trivial. Aortic valve sclerosis/calcification is present, without any evidence of aortic stenosis. Pulmonic Valve: The pulmonic valve was not well visualized. Pulmonic valve regurgitation is trivial. No evidence of pulmonic stenosis. Aorta: The aortic root is normal in size and structure. Venous: The inferior vena cava is dilated in size with greater than 50% respiratory variability, suggesting right atrial pressure of 8 mmHg. IAS/Shunts: No atrial level shunt detected by color flow Doppler.  LEFT VENTRICLE PLAX 2D LVIDd:         3.90 cm     Diastology LVIDs:         3.00 cm     LV e' medial:    4.47 cm/s LV PW:         1.10 cm     LV E/e' medial:  38.5 LV IVS:        1.10 cm     LV e' lateral:   5.22 cm/s LVOT diam:     2.00 cm     LV E/e' lateral: 33.0 LV SV:         89 LV SV Index:   42 LVOT Area:     3.14 cm  LV Volumes (MOD) LV vol d, MOD A4C: 63.8 ml LV vol s, MOD A4C: 31.1 ml LV SV MOD A4C:     63.8 ml RIGHT VENTRICLE             IVC RV S prime:     10.40 cm/s  IVC diam: 2.10 cm TAPSE (M-mode): 2.3 cm LEFT ATRIUM             Index        RIGHT ATRIUM           Index LA diam:        4.30 cm 2.05 cm/m   RA Area:     18.70 cm LA Vol (A2C):   93.9 ml 44.80 ml/m  RA Volume:   51.80 ml  24.72 ml/m LA Vol (A4C):   93.2 ml 44.47 ml/m LA Biplane Vol: 95.6 ml 45.62 ml/m  AORTIC VALVE LVOT Vmax:   114.00 cm/s LVOT Vmean:  82.100 cm/s LVOT VTI:    0.282 m  AORTA Ao Root diam: 2.70 cm Ao Asc diam:  3.40 cm MITRAL VALVE                TRICUSPID VALVE MV Area (PHT): 3.30 cm     TR Peak grad:   31.8 mmHg MV Area VTI:   1.92 cm     TR Vmax:        282.00 cm/s MV Peak grad:  9.7 mmHg MV Mean grad:  4.0 mmHg  SHUNTS MV Vmax:       1.56 m/s     Systemic VTI:  0.28 m MV Vmean:      91.5 cm/s    Systemic Diam: 2.00 cm MV Decel Time: 230 msec MR Peak grad:  71.9 mmHg MR Vmax:      424.00 cm/s MV E velocity: 172.00 cm/s MV A velocity: 77.90 cm/s MV E/A ratio:  2.21 Arvilla Meres MD Electronically signed by Arvilla Meres MD Signature Date/Time: 09/12/2022/10:37:29 AM    Final    US RENAL  Result Date: 09/11/2022 CLINICAL DATA:  Acute kidney injury EXAM: RENAL / URINARY TRACT ULTRASOUND COMPLETE COMPARISON:  CT 09/08/2022 FINDINGS: Right Kidney: Renal measurements: 11 x 4.3 x 4.5 cm = volume: 112 mL. Diffusely increased parenchymal echotexture suggesting fatty infiltration. No hydronephrosis. Two cysts demonstrated in the upper pole, largest measuring 2 cm diameter. No imaging follow-up is indicated. No solid mass lesions. Left Kidney: Renal measurements: 10.1 x 6.3 x 5.4 cm = volume: 178 mL. Diffusely increased parenchymal echotexture suggesting fatty infiltration. No hydronephrosis. No solid mass. Bladder: Appears normal for degree of bladder distention. Other: None. IMPRESSION: 1. Increased renal parenchymal echotexture bilaterally suggesting chronic medical renal disease. 2. No hydronephrosis. Electronically Signed   By: Burman Nieves M.D.   On: 09/11/2022 21:50   DG CHEST PORT 1 VIEW  Result Date: 09/09/2022 CLINICAL DATA:  Dyspnea.  Back pain.  Weakness. EXAM: PORTABLE CHEST 1 VIEW COMPARISON:  09/08/2022 FINDINGS: Atherosclerotic calcification of the aortic arch. Heart size within normal limits for AP projection. Mild atelectasis laterally at the left lung base. Otherwise unremarkable. IMPRESSION: 1. Mild atelectasis laterally at the left lung base. 2.  Aortic Atherosclerosis (ICD10-I70.0). Electronically Signed   By: Gaylyn Rong M.D.   On: 09/09/2022 17:21   CT ABDOMEN PELVIS WO CONTRAST  Result Date: 09/08/2022 CLINICAL DATA:  Abdominal pain.  Sacral wound. EXAM: CT ABDOMEN AND PELVIS WITHOUT CONTRAST TECHNIQUE: Multidetector CT imaging of the abdomen and pelvis was performed following the standard protocol without IV contrast.  RADIATION DOSE REDUCTION: This exam was performed according to the departmental dose-optimization program which includes automated exposure control, adjustment of the mA and/or kV according to patient size and/or use of iterative reconstruction technique. COMPARISON:  June 11, 2019 FINDINGS: Lower chest: Bibasilar atelectasis. Hepatobiliary: No focal liver abnormality is seen. Status post cholecystectomy. No biliary dilatation. Pancreas: Unremarkable. No pancreatic ductal dilatation or surrounding inflammatory changes. Spleen: Normal in size without focal abnormality. Adrenals/Urinary Tract: Normal adrenal glands. Right upper pole renal cyst measuring 2.2 cm normal left kidney. Normal bladder. Stomach/Bowel: Stomach is within normal limits. Appendix appears normal. No evidence of bowel wall thickening, distention, or inflammatory changes. Vascular/Lymphatic: Aortic atherosclerosis. No enlarged abdominal or pelvic lymph nodes. Reproductive: Status post hysterectomy. No adnexal masses. Other: Right sacral wound extends from the skin surface to the outer surface of the gluteal musculature. Significant gas collection is seen with surrounding fat stranding. The process does not seem to involve the rectum. Musculoskeletal: No acute or significant osseous findings. IMPRESSION: 1. Right sacral wound extends from the skin surface to the outer surface of the gluteal musculature. Significant gas collection is seen with surrounding fat stranding. The process does not seem to involve the rectum. 2. Aortic atherosclerosis. Aortic Atherosclerosis (ICD10-I70.0). Electronically Signed   By: Ted Mcalpine M.D.   On: 09/08/2022 14:56   DG Chest Portable 1 View  Result Date: 09/08/2022 CLINICAL DATA:  Recent diagnosis of COVID with increasing weakness EXAM: PORTABLE  CHEST 1 VIEW COMPARISON:  Chest radiograph dated 01/08/2022 FINDINGS: Normal lung volumes. No focal consolidations. Trace left pleural effusion. No pneumothorax.  Enlarged cardiomediastinal silhouette. No acute osseous abnormality. IMPRESSION: 1. Trace left pleural effusion.  No focal consolidations. 2. Cardiomegaly. Electronically Signed   By: Agustin Cree M.D.   On: 09/08/2022 13:38     .Ardeth Sportsman, M.D., F.A.C.S. Gastrointestinal and Minimally Invasive Surgery [facility] [address] [place], [place] [address] [phone] Main / Paging  09/12/2022 12:05 PM    Ardeth Sportsman

## 2022-09-12 NOTE — Transfer of Care (Signed)
Immediate Anesthesia Transfer of Care Note  Patient: [name] [name]  Procedure(s) Performed: INCISION AND DRAINAGE ABSCESS  Patient Location: PACU  Anesthesia Type:General  Level of Consciousness: awake, alert , oriented, and patient cooperative  Airway & Oxygen Therapy: Patient Spontanous Breathing and Patient connected to face mask oxygen  Post-op Assessment: Report given to RN and Post -op Vital signs reviewed and stable  Post vital signs: Reviewed and stable  Last Vitals:  Vitals Value Taken Time  BP 131/52 09/12/22 1545  Temp    Pulse 62 09/12/22 1547  Resp 12 09/12/22 1547  SpO2 98 % 09/12/22 1547  Vitals shown include unfiled device data.  Last Pain:  Vitals:   09/12/22 1341  TempSrc:   PainSc: 0-No pain         Complications: No notable events documented.

## 2022-09-12 NOTE — Progress Notes (Signed)
PROGRESS NOTE    [name] [name]  ZOX:[id number] DOB: [birth year] DOA: 09/08/2022 PCP: [name], MD   Brief Narrative:  89 y.o. female with medical history significant of hypertension, diabetes mellitus type 2, prior stroke, pancreatitis, hyperlipidemia, CKD stage IIIb, obesity, chronic back wound presented with worsening weakness, lethargy and back pain.  Patient was apparently diagnosed with COVID-19 on 09/06/2022 at her assisted living facility and was started on Paxlovid and was also started on Bactrim for diagnosis of cellulitis of the back.  On presentation, WBCs was 18.5, creatinine was 3.24.  CT of abdomen and pelvis without contrast showed right sacral wound extending from the skin surface to the outer surface of the gluteal musculature with significant gas collection.  She was started on IV fluids and antibiotics.  General surgery was consulted.  Palliative care also consulted for goals of care discussion.  Nephrology was also subsequently consulted on 09/11/2022 for AKI.  Assessment & Plan:   Cellulitis around right buttock deep tissue pressure injury: Present on admission Methicillin-resistant coagulase-negative Staphylococcus bacteremia -Patient has had low back/sacral wound for few months now.  Presented with cellulitis getting worse despite being on Bactrim for few days.  Imaging as above. -Currently on broad-spectrum antibiotics.  Follow wound care recommendations.  General surgery following.  Planning for possible debridement today. -Blood cultures growing Staphylococcus epidermidis in 1 set.  Unclear if this is contaminant or is clinically significant since patient has infected decubitus ulcer.  Repeat cultures from 09/10/2022 have been negative so far.  2D echo pending.  Continue current antibiotics  Leukocytosis -Slightly improving to 13.4 today.  Monitor  Acute kidney injury on chronic kidney disease stage IIIb Acute metabolic acidosis -Possibly from above. Creatinine  around 1.4-1.8.  Creatinine 3.04 on presentation.  Worsening to 3.57 today.  Renal ultrasound showed no hydronephrosis.  Patient received IV fluids initially but was discontinued and patient received couple of doses of IV Lasix for volume overload.   -Nephrology consulted on 09/11/2022: Patient received few doses of IV albumin.  Follow further nephrology recommendations. -Monitor kidney function  Recent diagnosis of COVID-19 -Diagnosed on 09/06/2022.  Continue isolation.  Given renal failure, Paxlovid held. -Continue supportive care. -Respiratory status currently stable  Hyponatremia -Sodium 129 today.  Nephrology following  Anemia of chronic disease -From chronic illnesses.  Hemoglobin stable.  Monitor intermittently.    Mildly elevated AST -Resolved  Hypertension -Monitor blood pressure.  Continue hydralazine and metoprolol.  Holding losartan, chlorthalidone and Lasix.    Diabetes mellitus type 2 with hyperglycemia -A1c 9.8.  Continue CBGs with SSI and continue NovoLog with meals.  Blood sugars still elevated.  Increase long-acting insulin.  [name] on hold  Physical deconditioning -PT recommending SNF placement.  TOC following  Goals of care -Patient is full code.  Discussed with the patient and daughter at bedside about the guarded prognosis on 09/09/2022.  They want to be full code for now.  palliative care discussion pending  DVT prophylaxis: heparin subcutaneous  code Status: Full Family Communication: Sons at bedside Disposition Plan: Status is: Inpatient Remains inpatient appropriate because: Of severity of illness    Consultants: General surgery.  palliative care.  Nephrology  Procedures: None  Antimicrobials: Cefepime, Flagyl and vancomycin from 09/08/2022 onwards   Subjective: Patient seen and examined at bedside.  No chest pain, vomiting, fever reported.  Still complains of intermittent back pain.   Objective: Vitals:   09/11/22 1937 09/11/22 2046 09/11/22  2152 09/12/22 0617  BP: 136/60   132/66  Pulse: (!) 58  67 (!) 59  Resp: 18   18  Temp: 97.6 F (36.4 C)   98.2 F (36.8 C)  TempSrc: Oral   Oral  SpO2: 99% 100%  100%  Weight:      Height:        Intake/Output Summary (Last 24 hours) at 09/12/2022 0737 Last data filed at 09/11/2022 1800 Gross per 24 hour  Intake 960 ml  Output 800 ml  Net 160 ml   Filed Weights   09/08/22 1222 09/08/22 1939  Weight: 99.8 kg 101.3 kg    Examination:  General: On 2 L oxygen via nasal cannula.  No distress.  Looks chronically ill and deconditioned. ENT/neck: No obvious neck masses or JVD elevation noted  respiratory: Decreased breath sounds at bases with some crackles CVS: S1-S2 heard; mild intermittent bradycardia present  abdominal: Soft, obese, nontender, still distended; no organomegaly, normal bowel sounds are heard  extremities: Bilateral trace lower extremity edema present, more on the right side; no clubbing CNS: Sleepy, wakes up slightly, answers some questions.  Slightly slow to respond.  Poor historian.  No obvious focal deficits noted  lymph: No cervical lymphadenopathy skin: No obvious lesions/ecchymosis psych: Mostly flat affect.  Showing no signs of agitation currently  musculoskeletal: No obvious joint deformity/swelling     Data Reviewed: I have personally reviewed following labs and imaging studies  CBC: Recent Labs  Lab 09/08/22 1234 09/09/22 0529 09/10/22 0528 09/11/22 0517 09/12/22 0458  WBC 18.5* 16.7* 14.6* 15.5* 13.4*  NEUTROABS 15.6*  --  13.6* 13.8* 11.7*  HGB 10.5* 9.8* 9.5* 9.5* 9.2*  HCT 33.3* 31.5* 30.2* 30.0* 27.6*  MCV 82.2 83.8 82.3 81.5 78.9*  PLT 240 238 261 309 330   Basic Metabolic Panel: Recent Labs  Lab 09/08/22 1605 09/09/22 0529 09/10/22 0528 09/11/22 0517 09/12/22 0458  NA 132* 131* 133* 130* 129*  K 4.3 4.4 4.8 4.2 4.2  CL 97* 96* 98 97* 96*  CO2 22 21* 19* 20* 21*  GLUCOSE 206* 259* 375* 301* 263*  BUN 50* 55* 68* 96* 111*   CREATININE 2.90* 2.82* 2.98* 3.38* 3.57*  CALCIUM 7.4* 7.3* 7.7* 7.6* 7.4*  MG  --   --  2.1 2.3 2.5*   GFR: Estimated Creatinine Clearance: 13.3 mL/min (A) (by C-G formula based on SCr of 3.57 mg/dL (H)). Liver Function Tests: Recent Labs  Lab 09/08/22 1234 09/09/22 0529 09/10/22 0528 09/11/22 0517  AST 53* 38 30 39  ALT 26 28 25 31   ALKPHOS 73 79 81 74  BILITOT 0.8 1.0 0.8 0.5  PROT 6.8 6.1* 6.1* 5.8*  ALBUMIN 2.8* 2.4* 2.4* 2.2*   No results for input(s): "LIPASE", "AMYLASE" in the last 168 hours. No results for input(s): "AMMONIA" in the last 168 hours. Coagulation Profile: No results for input(s): "INR", "PROTIME" in the last 168 hours. Cardiac Enzymes: Recent Labs  Lab 09/12/22 0458  CKTOTAL 219   BNP (last 3 results) No results for input(s): "PROBNP" in the last 8760 hours. HbA1C: No results for input(s): "HGBA1C" in the last 72 hours.  CBG: Recent Labs  Lab 09/10/22 2034 09/11/22 0815 09/11/22 1214 09/11/22 1626 09/11/22 2137  GLUCAP 316* 316* 350* 271* 246*   Lipid Profile: No results for input(s): "CHOL", "HDL", "LDLCALC", "TRIG", "CHOLHDL", "LDLDIRECT" in the last 72 hours. Thyroid Function Tests: No results for input(s): "TSH", "T4TOTAL", "FREET4", "T3FREE", "THYROIDAB" in the last 72 hours. Anemia Panel: No results for input(s): "VITAMINB12", "FOLATE", "FERRITIN", "TIBC", "IRON", "RETICCTPCT"  in the last 72 hours. Sepsis Labs: Recent Labs  Lab 09/08/22 1323  LATICACIDVEN 1.8    Recent Results (from the past 240 hour(s))  Culture, blood (routine x 2)     Status: Abnormal   Collection Time: 09/08/22  1:28 PM   Specimen: BLOOD  Result Value Ref Range Status   Specimen Description   Final    BLOOD BLOOD LEFT FOREARM Performed at [facility], [address], [place], [place] [address]    Special Requests   Final    BOTTLES DRAWN AEROBIC AND ANAEROBIC Blood Culture results may not be optimal due to an inadequate volume  of blood received in culture bottles Performed at [facility], [address], [place], [place] [address]    Culture  Setup Time   Final    GRAM POSITIVE COCCI AEROBIC BOTTLE ONLY CRITICAL RESULT CALLED TO, READ BACK BY AND VERIFIED WITH: PHARMD DREW WOFFORD [phone] 1730 BY J RAZZAK, MT    Culture (A)  Final    STAPHYLOCOCCUS EPIDERMIDIS THE SIGNIFICANCE OF ISOLATING THIS ORGANISM FROM A SINGLE SET OF BLOOD CULTURES WHEN MULTIPLE SETS ARE DRAWN IS UNCERTAIN. PLEASE NOTIFY THE MICROBIOLOGY DEPARTMENT WITHIN ONE WEEK IF SPECIATION AND SENSITIVITIES ARE REQUIRED. Performed at [facility], [address], [place], [place] [address]    Report Status 09/11/2022 FINAL  Final  Blood Culture ID Panel (Reflexed)     Status: Abnormal   Collection Time: 09/08/22  1:28 PM  Result Value Ref Range Status   Enterococcus faecalis NOT DETECTED NOT DETECTED Final   Enterococcus Faecium NOT DETECTED NOT DETECTED Final   Listeria monocytogenes NOT DETECTED NOT DETECTED Final   Staphylococcus species DETECTED (A) NOT DETECTED Final    Comment: CRITICAL RESULT CALLED TO, READ BACK BY AND VERIFIED WITH: PHARMD DREW WOFFORD [id number] 1730 BY J RAZZAK, MT    Staphylococcus aureus (BCID) NOT DETECTED NOT DETECTED Final   Staphylococcus epidermidis DETECTED (A) NOT DETECTED Final    Comment: Methicillin (oxacillin) resistant coagulase negative staphylococcus. Possible blood culture contaminant (unless isolated from more than one blood culture draw or clinical case suggests pathogenicity). No antibiotic treatment is indicated for blood  culture contaminants. CRITICAL RESULT CALLED TO, READ BACK BY AND VERIFIED WITH: PHARMD DREW WOFFORD [id number] 1730 BY J RAZZAK, MT    Staphylococcus lugdunensis NOT DETECTED NOT DETECTED Final   Streptococcus species NOT DETECTED NOT DETECTED Final   Streptococcus agalactiae NOT DETECTED NOT DETECTED Final   Streptococcus pneumoniae NOT DETECTED NOT  DETECTED Final   Streptococcus pyogenes NOT DETECTED NOT DETECTED Final   A.calcoaceticus-baumannii NOT DETECTED NOT DETECTED Final   Bacteroides fragilis NOT DETECTED NOT DETECTED Final   Enterobacterales NOT DETECTED NOT DETECTED Final   Enterobacter cloacae complex NOT DETECTED NOT DETECTED Final   Escherichia coli NOT DETECTED NOT DETECTED Final   Klebsiella aerogenes NOT DETECTED NOT DETECTED Final   Klebsiella oxytoca NOT DETECTED NOT DETECTED Final   Klebsiella pneumoniae NOT DETECTED NOT DETECTED Final   Proteus species NOT DETECTED NOT DETECTED Final   Salmonella species NOT DETECTED NOT DETECTED Final   Serratia marcescens NOT DETECTED NOT DETECTED Final   Haemophilus influenzae NOT DETECTED NOT DETECTED Final   Neisseria meningitidis NOT DETECTED NOT DETECTED Final   Pseudomonas aeruginosa NOT DETECTED NOT DETECTED Final   Stenotrophomonas maltophilia NOT DETECTED NOT DETECTED Final   Candida albicans NOT DETECTED NOT DETECTED Final   Candida auris NOT DETECTED NOT DETECTED Final  Candida glabrata NOT DETECTED NOT DETECTED Final   Candida krusei NOT DETECTED NOT DETECTED Final   Candida parapsilosis NOT DETECTED NOT DETECTED Final   Candida tropicalis NOT DETECTED NOT DETECTED Final   Cryptococcus neoformans/gattii NOT DETECTED NOT DETECTED Final   Methicillin resistance mecA/C DETECTED (A) NOT DETECTED Final    Comment: CRITICAL RESULT CALLED TO, READ BACK BY AND VERIFIED WITH: Nathanial Millman [facility] [id number] 1730 BY Berline Chough, MT Performed at [facility], [address], [place], [place] [address]   Culture, blood (routine x 2)     Status: None (Preliminary result)   Collection Time: 09/08/22  1:59 PM   Specimen: BLOOD  Result Value Ref Range Status   Specimen Description   Final    BLOOD BLOOD LEFT HAND Performed at [facility], [address] [place], [place] [address]    Special Requests   Final    BOTTLES DRAWN AEROBIC ONLY Blood Culture  results may not be optimal due to an inadequate volume of blood received in culture bottles Performed at [facility], [address], [place], [place] [address]    Culture   Final    NO GROWTH 3 DAYS Performed at [facility], [address], [place], [place] [address]    Report Status PENDING  Incomplete  MRSA Next Gen by PCR, Nasal     Status: None   Collection Time: 09/08/22 11:13 PM   Specimen: Nasal Mucosa; Nasal Swab  Result Value Ref Range Status   MRSA by PCR Next Gen NOT DETECTED NOT DETECTED Final    Comment: (NOTE) The GeneXpert MRSA Assay (FDA approved for NASAL specimens only), is one component of a comprehensive MRSA colonization surveillance program. It is not intended to diagnose MRSA infection nor to guide or monitor treatment for MRSA infections. Test performance is not FDA approved in patients less than 75 years old. Performed at [facility], [address], [place], [place] [address]   Culture, blood (Routine X 2) w Reflex to ID Panel     Status: None (Preliminary result)   Collection Time: 09/10/22  5:32 AM   Specimen: BLOOD  Result Value Ref Range Status   Specimen Description   Final    BLOOD BLOOD LEFT ARM Performed at [facility], [address] [place], [place] [address]    Special Requests   Final    Blood Culture adequate volume BOTTLES DRAWN AEROBIC AND ANAEROBIC Performed at [facility], [address] [place], [place] [address]    Culture   Final    NO GROWTH 1 DAY Performed at [facility], [address], [place], [place] [address]    Report Status PENDING  Incomplete  Culture, blood (Routine X 2) w Reflex to ID Panel     Status: None (Preliminary result)   Collection Time: 09/10/22  5:32 AM   Specimen: BLOOD  Result Value Ref Range Status   Specimen Description   Final    BLOOD BLOOD LEFT ARM Performed at [facility], [address], [place], [place] [address]    Special Requests   Final    Blood Culture adequate volume BOTTLES DRAWN AEROBIC AND ANAEROBIC Performed at [facility], [address] [place], [place] [address]    Culture   Final    NO GROWTH 1 DAY Performed at [facility], [address], [place],  [place] [address]    Report Status PENDING  Incomplete         Radiology Studies: US RENAL  Result Date: 09/11/2022 CLINICAL DATA:  Acute kidney injury EXAM: RENAL / URINARY TRACT ULTRASOUND COMPLETE COMPARISON:  CT 09/08/2022 FINDINGS: Right Kidney: Renal measurements: 11 x 4.3 x 4.5 cm = volume: 112 mL. Diffusely increased parenchymal echotexture suggesting fatty infiltration. No hydronephrosis. Two cysts demonstrated in the upper pole, largest measuring 2 cm diameter. No imaging follow-up is indicated. No solid mass lesions. Left Kidney: Renal measurements: 10.1 x 6.3 x 5.4 cm = volume: 178 mL. Diffusely increased parenchymal echotexture suggesting fatty infiltration. No hydronephrosis. No solid mass. Bladder: Appears normal for degree of bladder distention. Other: None. IMPRESSION: 1. Increased renal parenchymal echotexture bilaterally suggesting chronic medical renal disease. 2. No hydronephrosis. Electronically Signed   By: Burman Nieves M.D.   On: 09/11/2022 21:50        Scheduled Meds:  heparin injection (subcutaneous)  5,000 Units Subcutaneous Q8H   hydrALAZINE  100 mg Oral TID   insulin aspart  0-20 Units Subcutaneous TID WC   insulin aspart  0-5 Units Subcutaneous QHS   insulin aspart  20 Units Subcutaneous TID WC   insulin glargine-yfgn  30 Units Subcutaneous BID   ipratropium-albuterol  3 mL Nebulization BID   metoprolol tartrate  100 mg Oral BID   Continuous Infusions:  ceFEPime (MAXIPIME) IV 2 g (09/11/22 1230)   metronidazole 500 mg (09/11/22 2203)   vancomycin 750 mg (09/10/22 2312)          Glade Lloyd, MD [facility] 09/12/2022, 7:37 AM

## 2022-09-12 NOTE — Anesthesia Postprocedure Evaluation (Signed)
Anesthesia Post Note  Patient: [name] [name]  Procedure(s) Performed: INCISION AND DRAINAGE ABSCESS     Patient location during evaluation: PACU Anesthesia Type: General Level of consciousness: sedated Pain management: pain level controlled Vital Signs Assessment: post-procedure vital signs reviewed and stable Respiratory status: spontaneous breathing and respiratory function stable Cardiovascular status: stable Postop Assessment: no apparent nausea or vomiting Anesthetic complications: no  No notable events documented.  Last Vitals:  Vitals:   09/12/22 1615 09/12/22 1630  BP: 134/63 (!) 145/57  Pulse: 61 61  Resp: 12 10  Temp:    SpO2: 97% 97%    Last Pain:  Vitals:   09/12/22 1630  TempSrc:   PainSc: Asleep                 Romari Gasparro DANIEL

## 2022-09-12 NOTE — Op Note (Signed)
09/08/2022 - 09/12/2022  3:26 PM  PATIENT:  [name] [name]  89 y.o. female  Patient Care Team: [name], MD as PCP - General (Family Medicine)  PRE-OPERATIVE DIAGNOSIS:  UNSTAGEABLE GLUTEAL PRESSURE WOUND  POST-OPERATIVE DIAGNOSIS:  RIGHT POSTERIOR GLUTEAL DECUBITUS WITH ABSCESS, WET GANGRENE,  & NECROSIS  PROCEDURE:  INCISION AND DRAINAGE ABSCESS WITH EXTENSIVE DEBRIDEMENT  SURGEON:  Ardeth Sportsman, MD  ASSISTANT:  (n/a)   ANESTHESIA:  General endotracheal intubation anesthesia (GETA)  Estimated Blood Loss (EBL):   Total I/O In: 250 [I.V.:250] Out: 10 [Blood:10].   (See anesthesia record)  Delay start of Pharmacological VTE agent (>24hrs) due to concerns of significant anemia, surgical blood loss, or risk of bleeding?:  no  DRAINS: (None)  SPECIMEN: Abscess and subcutaneous tissue/skin for fluid culture, tissue culture, pathology.  COUNTS:  Sponge, needle, & instrument counts CORRECT  PLAN OF CARE: Admit to inpatient   PATIENT DISPOSITION:  PACU - hemodynamically stable.  INDICATION: Pleasant elderly woman with multiple medical problems stays in wheelchair versus bed.  Has had an wound/ulcer on her right buttock near her sacrum for a while.  Came in with COVID.  Concern for more foul smell and drainage from the gluteus.  Recommendation made for operative exploration.  The anatomy and physiology of skin abscesses was discussed. Pathophysiology of SQ abscess, possible progression to fasciitis & sepsis, etc discussed . I stressed good hygiene & wound care. Possible redebridement was discussed as well.   Possibility of recurrence was discussed. Risks, benefits, alternatives were discussed. I noted a good likelihood this will help address the problem. Risks of anesthesia and other risks discussed. Questions answered. The patient is does wish to proceed.   OR FINDINGS: Right gluteal decubitus abscess not at the intergluteal cleft but spreading over the posterior gluteus  towards the posterior axillary line, upper gluteus, inferior gluteus.  Right medial posterior abscess with necrotic subcutaneous tissue fat and skin.  Numerous counterincisions needed done superiorly right lateral and right inferior.  Packed with large Kerlix.  Wound measures 9cm by 7cm.  The spread of necrosis and abscess 20 x 15 cm region requiring 3cm counterincisions x 4.  Intergluteal cleft and left gluteus not involved.  No perirectal/perineal involvement.  CASE DATA:  Type of patient?: LDOW CASE (Surgical Hospitalist [facility] Inpatient)  Status of Case? URGENT Add On  Infection Present At Time Of Surgery (PATOS)?  PUS  DESCRIPTION:   Informed consent was confirmed. The patient received IV antibiotics. The patient underwent general anesthesia without any difficulty.  COVID precautions done.  The patient was positioned prone/jackknife.  SCDs were active during the entire case. The area around the abscess was prepped and draped in a sterile fashion. A surgical timeout confirmed our plan.   I made an incision over the most fluctuant area of the mass.   Upon entering the cavity (organ space), I encountered purulence in the subcutaneous tissues with wet gangrene and necrotic fat. .  I placed my finger into the abscess cavity to break up loculations.  Sent fluid for aerobic and anaerobic culture.  I ended up having to transect the ischemic isthmus of skin to connect the inferomedial and more central right gluteal subcutaneous tissues and opened up to have a larger wound.  Encountered spontaneously draining brackish purulence suspicious for necrotizing fasciitis/Fournier's gangrene.  Sharp dissected to remove all the gangrenous necrotic and purulent tissue trim the edges with sharp scissors to I&O healthy bleeding dermis and skin.  Did finger dissection and  easily carried over 7 cm superiorly and laterally and inferiorly.  Did counterincisions until I came to normal subcutaneous tissue.  Came to the gluteus  muscle.  Fascia and muscle appeared to be viable.  There was no extension across the intergluteal cleft to the left side.  I did do a 3 cm counterincision in the left inner gluteus and investigated the subcutaneous tissue and fascia.  No evidence of infection nor involvement.  We did irrigation and debrided.  The fascia was viable.  I sharply debrided out some necrotic fat with scalpel and scissors.  We sent some of the necrotic gangrenous tissue for tissue culture as well as for pathology.  Continued did sharp debridement of skin with a scssors to allow a 9cm by 7cm open wound.  We took extra care to ensure hemostasis.   The wound was packed with chlorhexidine antibiotic-soaked rolled 6" Kerlix gauze.  Sterile dressings applied.  Patient is being extubated go to recovery room.   We plan to continue IV antibiotics and begin wound care training tomorrow.  I called and attempted to reach both sons and daughter of the patient with no answer.  I made an attempt to locate & reach the desired patient contact to discuss the patient's overall status and my recommendations.  No one is available at this time.  My plans & instructions have been written in the chart.  We will follow closely.  Ardeth Sportsman, M.D., F.A.C.S. Gastrointestinal and Minimally Invasive Surgery [facility] [address], [address] [place], [place] [address] [phone] Main / Paging

## 2022-09-12 NOTE — Anesthesia Preprocedure Evaluation (Addendum)
Anesthesia Evaluation  Patient identified by MRN, date of birth, ID band Patient awake    Reviewed: Allergy & Precautions, NPO status , Patient's Chart, lab work & pertinent test results  Airway Mallampati: II  TM Distance: >3 FB Neck ROM: Full    Dental no notable dental hx.    Pulmonary  COVID+   Pulmonary exam normal        Cardiovascular hypertension, Pt. on medications and Pt. on home beta blockers +CHF   Rhythm:Regular Rate:Normal  ECHO 07/24:  1. Left ventricular ejection fraction, by estimation, is 60 to 65%. The left ventricle has normal function. The left ventricle has no regional wall motion abnormalities. There is mild concentric left ventricular hypertrophy. Left ventricular diastolic parameters are consistent with Grade II diastolic dysfunction (pseudonormalization).  2. Right ventricular systolic function is normal. The right ventricular size is normal. There is mildly elevated pulmonary artery systolic pressure. The estimated right ventricular systolic pressure is 39.8 mmHg.  3. Left atrial size was moderately dilated.  4. Right atrial size was mild to moderately dilated.  5. The mitral valve is degenerative. Mild mitral valve regurgitation. No evidence of mitral stenosis. Moderate mitral annular calcification.  6. The aortic valve is tricuspid. There is mild calcification of the aortic valve. Aortic valve regurgitation is trivial. Aortic valve sclerosis/calcification is present, without any evidence of aortic stenosis.  7. The inferior vena cava is dilated in size with >50% respiratory variability, suggesting right atrial pressure of 8 mmHg.      Neuro/Psych   Anxiety     negative neurological ROS     GI/Hepatic Neg liver ROS,GERD  Medicated,,Gluteal pressure wound   Endo/Other  diabetes, Type 2, Insulin Dependent    Renal/GU   negative genitourinary   Musculoskeletal  (+) Arthritis , Osteoarthritis,     Abdominal Normal abdominal exam  (+)   Peds  Hematology Lab Results      Component                Value               Date                      WBC                      13.4 (H)            09/12/2022                HGB                      9.2 (L)             09/12/2022                HCT                      27.6 (L)            09/12/2022                MCV                      78.9 (L)            09/12/2022                PLT  330                 09/12/2022              Anesthesia Other Findings   Reproductive/Obstetrics                             Anesthesia Physical Anesthesia Plan  ASA: 3  Anesthesia Plan: General   Post-op Pain Management:    Induction: Intravenous  PONV Risk Score and Plan: 3 and Ondansetron, Dexamethasone and Treatment may vary due to age or medical condition  Airway Management Planned: Mask and Oral ETT  Additional Equipment: None  Intra-op Plan:   Post-operative Plan: Extubation in OR  Informed Consent: I have reviewed the patients History and Physical, chart, labs and discussed the procedure including the risks, benefits and alternatives for the proposed anesthesia with the patient or authorized representative who has indicated his/her understanding and acceptance.     Dental advisory given  Plan Discussed with: CRNA  Anesthesia Plan Comments:        Anesthesia Quick Evaluation

## 2022-09-12 NOTE — Plan of Care (Signed)
  Problem: Skin Integrity: Goal: Risk for impaired skin integrity will decrease Outcome: Progressing   Problem: Education: Goal: Knowledge of General Education information will improve Description: Including pain rating scale, medication(s)/side effects and non-pharmacologic comfort measures Outcome: Progressing   Problem: Clinical Measurements: Goal: Ability to maintain clinical measurements within normal limits will improve Outcome: Progressing   Problem: Pain Managment: Goal: General experience of comfort will improve Outcome: Progressing

## 2022-09-13 ENCOUNTER — Encounter (HOSPITAL_COMMUNITY): Payer: Self-pay | Admitting: Surgery

## 2022-09-13 DIAGNOSIS — R410 Disorientation, unspecified: Secondary | ICD-10-CM

## 2022-09-13 DIAGNOSIS — U071 COVID-19: Secondary | ICD-10-CM | POA: Diagnosis not present

## 2022-09-13 DIAGNOSIS — R1310 Dysphagia, unspecified: Secondary | ICD-10-CM

## 2022-09-13 DIAGNOSIS — L0231 Cutaneous abscess of buttock: Secondary | ICD-10-CM

## 2022-09-13 DIAGNOSIS — N1832 Chronic kidney disease, stage 3b: Secondary | ICD-10-CM | POA: Diagnosis not present

## 2022-09-13 DIAGNOSIS — N179 Acute kidney failure, unspecified: Secondary | ICD-10-CM | POA: Insufficient documentation

## 2022-09-13 LAB — COMPREHENSIVE METABOLIC PANEL
ALT: 34 U/L (ref 0–44)
AST: 28 U/L (ref 15–41)
Albumin: 2.8 g/dL — ABNORMAL LOW (ref 3.5–5.0)
Alkaline Phosphatase: 59 U/L (ref 38–126)
Anion gap: 13 (ref 5–15)
BUN: 121 mg/dL — ABNORMAL HIGH (ref 8–23)
CO2: 18 mmol/L — ABNORMAL LOW (ref 22–32)
Calcium: 7.6 mg/dL — ABNORMAL LOW (ref 8.9–10.3)
Chloride: 98 mmol/L (ref 98–111)
Creatinine, Ser: 3.24 mg/dL — ABNORMAL HIGH (ref 0.44–1.00)
GFR, Estimated: 13 mL/min — ABNORMAL LOW (ref >60)
Glucose, Bld: 351 mg/dL — ABNORMAL HIGH (ref 70–99)
Potassium: 4.9 mmol/L (ref 3.5–5.1)
Sodium: 129 mmol/L — ABNORMAL LOW (ref 135–145)
Total Bilirubin: 0.7 mg/dL (ref 0.3–1.2)
Total Protein: 6 g/dL — ABNORMAL LOW (ref 6.5–8.1)

## 2022-09-13 LAB — URINALYSIS, ROUTINE W REFLEX MICROSCOPIC
Bacteria, UA: NONE SEEN
Bilirubin Urine: NEGATIVE
Glucose, UA: NEGATIVE mg/dL
Ketones, ur: NEGATIVE mg/dL
Leukocytes,Ua: NEGATIVE
Nitrite: NEGATIVE
Protein, ur: 30 mg/dL — AB
Specific Gravity, Urine: 1.015 (ref 1.005–1.030)
pH: 5 (ref 5.0–8.0)

## 2022-09-13 LAB — CBC WITH DIFFERENTIAL/PLATELET
Abs Immature Granulocytes: 0.59 10*3/uL — ABNORMAL HIGH (ref 0.00–0.07)
Basophils Absolute: 0.1 10*3/uL (ref 0.0–0.1)
Basophils Relative: 1 %
Eosinophils Absolute: 0 10*3/uL (ref 0.0–0.5)
Eosinophils Relative: 0 %
HCT: 30.5 % — ABNORMAL LOW (ref 36.0–46.0)
Hemoglobin: 9.6 g/dL — ABNORMAL LOW (ref 12.0–15.0)
Immature Granulocytes: 4 %
Lymphocytes Relative: 4 %
Lymphs Abs: 0.6 10*3/uL — ABNORMAL LOW (ref 0.7–4.0)
MCH: 25.7 pg — ABNORMAL LOW (ref 26.0–34.0)
MCHC: 31.5 g/dL (ref 30.0–36.0)
MCV: 81.6 fL (ref 80.0–100.0)
Monocytes Absolute: 0.9 10*3/uL (ref 0.1–1.0)
Monocytes Relative: 6 %
Neutro Abs: 12.5 10*3/uL — ABNORMAL HIGH (ref 1.7–7.7)
Neutrophils Relative %: 85 %
Platelets: 348 10*3/uL (ref 150–400)
RBC: 3.74 MIL/uL — ABNORMAL LOW (ref 3.87–5.11)
RDW: 15.6 % — ABNORMAL HIGH (ref 11.5–15.5)
WBC: 14.7 10*3/uL — ABNORMAL HIGH (ref 4.0–10.5)
nRBC: 0.2 % (ref 0.0–0.2)

## 2022-09-13 LAB — GLUCOSE, CAPILLARY
Glucose-Capillary: 323 mg/dL — ABNORMAL HIGH (ref 70–99)
Glucose-Capillary: 281 mg/dL — ABNORMAL HIGH (ref 70–99)
Glucose-Capillary: 197 mg/dL — ABNORMAL HIGH (ref 70–99)
Glucose-Capillary: 158 mg/dL — ABNORMAL HIGH (ref 70–99)

## 2022-09-13 LAB — MAGNESIUM: Magnesium: 2.5 mg/dL — ABNORMAL HIGH (ref 1.7–2.4)

## 2022-09-13 LAB — CULTURE, BLOOD (ROUTINE X 2): Culture: NO GROWTH

## 2022-09-13 LAB — VANCOMYCIN, RANDOM: Vancomycin Rm: 11 ug/mL

## 2022-09-13 LAB — C-REACTIVE PROTEIN: CRP: 6.5 mg/dL — ABNORMAL HIGH (ref <1.0)

## 2022-09-13 MED ORDER — LACTATED RINGERS IV SOLN: INTRAVENOUS | Status: DC

## 2022-09-13 MED ORDER — HYDRALAZINE HCL 20 MG/ML IJ SOLN
5.0000 mg | Freq: Four times a day (QID) | INTRAMUSCULAR | Status: AC | PRN
  Administered 2022-09-13: 5 mg via INTRAVENOUS
  Filled 2022-09-13: qty 1

## 2022-09-13 MED ORDER — BISACODYL 10 MG RE SUPP
10.0000 mg | Freq: Once | RECTAL | Status: AC
  Administered 2022-09-13: 10 mg via RECTAL
  Filled 2022-09-13: qty 1

## 2022-09-13 MED ORDER — SODIUM CHLORIDE 0.9 % IV SOLN
8.0000 mg/kg | INTRAVENOUS | Status: DC
  Administered 2022-09-15: 600 mg via INTRAVENOUS
  Filled 2022-09-13: qty 12

## 2022-09-13 MED ORDER — SODIUM CHLORIDE 0.9 % IV SOLN
8.0000 mg/kg | Freq: Every day | INTRAVENOUS | Status: AC
  Administered 2022-09-13: 600 mg via INTRAVENOUS
  Filled 2022-09-13: qty 10

## 2022-09-13 MED ORDER — INSULIN GLARGINE-YFGN 100 UNIT/ML ~~LOC~~ SOLN
15.0000 [IU] | Freq: Two times a day (BID) | SUBCUTANEOUS | Status: DC
  Administered 2022-09-14 – 2022-09-16 (×4): 15 [IU] via SUBCUTANEOUS
  Filled 2022-09-13 (×7): qty 0.15

## 2022-09-13 MED ORDER — FENTANYL CITRATE PF 50 MCG/ML IJ SOSY
12.5000 ug | PREFILLED_SYRINGE | Freq: Once | INTRAMUSCULAR | Status: AC
  Administered 2022-09-13: 12.5 ug via INTRAVENOUS
  Filled 2022-09-13: qty 1

## 2022-09-13 NOTE — Plan of Care (Signed)
  Problem: Tissue Perfusion: Goal: Adequacy of tissue perfusion will improve Outcome: Progressing   Problem: Education: Goal: Knowledge of General Education information will improve Description: Including pain rating scale, medication(s)/side effects and non-pharmacologic comfort measures Outcome: Progressing   Problem: Clinical Measurements: Goal: Ability to maintain clinical measurements within normal limits will improve Outcome: Progressing   Problem: Safety: Goal: Ability to remain free from injury will improve Outcome: Progressing

## 2022-09-13 NOTE — Progress Notes (Signed)
OT Cancellation Note  Patient Details Name: [name] [name] MRN: [id number] DOB: 02/10/1933   Cancelled Treatment:    Reason Eval/Treat Not Completed: Patient declined, no reason specified. Pt shook her head to indicate "no" when asked about working with therapy.     Reuben Likes, OTR/L 09/13/2022, 5:18 PM

## 2022-09-13 NOTE — Progress Notes (Signed)
Progress Note   Patient: [name] [name]:[id number] DOB: 07/14/1932 DOA: 09/08/2022     5 DOS: the patient was seen and examined on 09/13/2022   Brief hospital course:   Assessment and Plan: Right posterior gluteal decubitus abscess with wet gangrene and necrosis Status post incision and drainage with extensive debridement 7/16 per general surgery.   Continue broad-spectrum antibiotics.  Continue management per general surgery. Blood culture Staphylococcus epidermidis in 1 set.  Contaminant.  Repeat cultures from 09/10/2022 have been negative so far.     Acute kidney injury on chronic kidney disease stage IIIb Acute metabolic acidosis Presumably secondary to severe infection Creatinine around 1.4-1.8.  Creatinine 3.04 on presentation.   Renal ultrasound showed no hydronephrosis.   Creatinine slightly better today.  Continue management per nephrology.   COVID-19 Diagnosed on 09/06/2022.  Continue isolation.  Given renal failure, Paxlovid held. Continue supportive care. Respiratory status currently stable  Dysphagia Delirium Suspect dysphagia secondary to acute delirium secondary to acute infection, perioperative state. N.p.o. for now.  Speech therapy evaluation tomorrow. IV fluids.  Constipation.  No bowel movement in 1 week. Currently NPO.  Suppository.  If ineffective, enema.   Hyponatremia Mild, stable.  Management per nephrology.  Normocytic anemia Probably secondary to acute illness.  Follow-up as an outpatient.   Essential hypertension Continue hydralazine and metoprolol.     Diabetes mellitus type 2 with hyperglycemia A1c 9.8.  Continue sliding scale insulin, meal coverage, Semglee   Physical deconditioning PT recommending SNF placement.  TOC following   Goals of care Patient is full code.   PMH stroke with RUE and RLE hemiparesis     Subjective:  Feels ok, no complaints Per son at bedside: more confused today, not eating much today, got choked on  evening meal  Per RN: no BM in 7 days. Concern for aspiration this evening. Weaker, harder to transfer to commode. Confused.   Physical Exam: Vitals:   09/13/22 0958 09/13/22 0959 09/13/22 1407 09/13/22 1755  BP: 109/71  (!) 152/54 (!) 155/61  Pulse: (!) 122 65 (!) 58 60  Resp:   12   Temp:   97.7 F (36.5 C)   TempSrc:      SpO2: 94% 100% 100%   Weight:      Height:       Physical Exam Vitals reviewed.  Constitutional:      General: She is not in acute distress.    Appearance: She is not ill-appearing or toxic-appearing.  Cardiovascular:     Rate and Rhythm: Normal rate and regular rhythm.     Heart sounds: No murmur heard. Pulmonary:     Effort: Pulmonary effort is normal. No respiratory distress.     Breath sounds: No wheezing, rhonchi or rales.  Musculoskeletal:     Right lower leg: Edema present.     Left lower leg: No edema.     Comments: Marked RUE, RLE edema  Neurological:     Mental Status: She is alert. She is disoriented.  Psychiatric:        Mood and Affect: Mood normal.        Behavior: Behavior normal.     Data Reviewed: {Tip this will not be part of the note when signed- Document your independent interpretation of telemetry tracing, EKG, lab, Radiology test or any other diagnostic tests. Add any new diagnostic test ordered today.  Family Communication: son and grandson at bedside  Disposition: Status is: Inpatient Remains inpatient appropriate because: abscess  Time spent: 25 minutes  Author: Brendia Sacks, MD 09/13/2022 6:09 PM  For on call review [email or web address].

## 2022-09-13 NOTE — Progress Notes (Addendum)
Progress Note  1 Day Post-Op  Subjective: Patient with some agitation this AM per RN. Son at bedside. Discussed wound care   Objective: Vital signs in last 24 hours: Temp:  [97.4 F (36.3 C)-98.3 F (36.8 C)] 98.3 F (36.8 C) (07/16 1701) Pulse Rate:  [57-122] 65 (07/17 0959) Resp:  [10-18] 17 (07/17 0616) BP: (109-161)/(51-71) 109/71 (07/17 0958) SpO2:  [94 %-100 %] 100 % (07/17 0959) Weight:  [101.3 kg] 101.3 kg (07/16 1341) Last BM Date : 09/06/22  Intake/Output from previous day: 07/16 0701 - 07/17 0700 In: 520 [P.O.:120; I.V.:400] Out: 10 [Blood:10] Intake/Output this shift: No intake/output data recorded.  PE: General: pleasant, WD, obese female who is laying in bed in NAD Lungs: On supplemental oxygen 2L via [place].  Respiratory effort nonlabored Abd: soft, NT, ND GU: right gluteal wound as pictured below, some fibrinous tissue present but minimal purulence, packing removed without complication    Lab Results:  Recent Labs    09/12/22 0458 09/13/22 0537  WBC 13.4* 14.7*  HGB 9.2* 9.6*  HCT 27.6* 30.5*  PLT 330 348   BMET Recent Labs    09/12/22 0458 09/13/22 0537  NA 129* 129*  K 4.2 4.9  CL 96* 98  CO2 21* 18*  GLUCOSE 263* 351*  BUN 111* 121*  CREATININE 3.57* 3.24*  CALCIUM 7.4* 7.6*   PT/INR No results for input(s): "LABPROT", "INR" in the last 72 hours. CMP     Component Value Date/Time   NA 129 (L) 09/13/2022 0537   K 4.9 09/13/2022 0537   CL 98 09/13/2022 0537   CO2 18 (L) 09/13/2022 0537   GLUCOSE 351 (H) 09/13/2022 0537   BUN 121 (H) 09/13/2022 0537   CREATININE 3.24 (H) 09/13/2022 0537   CALCIUM 7.6 (L) 09/13/2022 0537   PROT 6.0 (L) 09/13/2022 0537   ALBUMIN 2.8 (L) 09/13/2022 0537   AST 28 09/13/2022 0537   ALT 34 09/13/2022 0537   ALKPHOS 59 09/13/2022 0537   BILITOT 0.7 09/13/2022 0537   GFRNONAA 13 (L) 09/13/2022 0537   GFRAA 39 (L) 06/11/2019 1436   Lipase     Component Value Date/Time   LIPASE 21 06/11/2019  1436       Studies/Results: ECHOCARDIOGRAM COMPLETE  Result Date: 09/12/2022    ECHOCARDIOGRAM REPORT   Patient Name:   TIMMIA COGBURN Date of Exam: 09/12/2022 Medical Rec #:  [id number]        Height:       66.0 in Accession #:    [id number]       Weight:       223.3 lb Date of Birth:  09/08/1932        BSA:          2.096 m Patient Age:    89 years         BP:           132/66 mmHg Patient Gender: F                HR:           63 bpm. Exam Location:  Inpatient Procedure: 2D Echo, Cardiac Doppler, Color Doppler and Intracardiac            Opacification Agent Indications:    Bacteremia  History:        Patient has prior history of Echocardiogram examinations, most                 recent 01/05/2020.  Stroke, Signs/Symptoms:Weakness/Lethargy; Risk                 Factors:Hypertension, Diabetes, Dyslipidemia and Obesity. CKD.  Sonographer:    Milda Smart Referring Phys: [id number] [facility]  Sonographer Comments: Patient is obese. Image acquisition challenging due to patient body habitus and Image acquisition challenging due to respiratory motion. IMPRESSIONS  1. Left ventricular ejection fraction, by estimation, is 60 to 65%. The left ventricle has normal function. The left ventricle has no regional wall motion abnormalities. There is mild concentric left ventricular hypertrophy. Left ventricular diastolic parameters are consistent with Grade II diastolic dysfunction (pseudonormalization).  2. Right ventricular systolic function is normal. The right ventricular size is normal. There is mildly elevated pulmonary artery systolic pressure. The estimated right ventricular systolic pressure is 39.8 mmHg.  3. Left atrial size was moderately dilated.  4. Right atrial size was mild to moderately dilated.  5. The mitral valve is degenerative. Mild mitral valve regurgitation. No evidence of mitral stenosis. Moderate mitral annular calcification.  6. The aortic valve is tricuspid. There is mild calcification of the  aortic valve. Aortic valve regurgitation is trivial. Aortic valve sclerosis/calcification is present, without any evidence of aortic stenosis.  7. The inferior vena cava is dilated in size with >50% respiratory variability, suggesting right atrial pressure of 8 mmHg. Conclusion(s)/Recommendation(s): No evidence of valvular vegetations on this transthoracic echocardiogram. Consider a transesophageal echocardiogram to exclude infective endocarditis if clinically indicated. FINDINGS  Left Ventricle: Left ventricular ejection fraction, by estimation, is 60 to 65%. The left ventricle has normal function. The left ventricle has no regional wall motion abnormalities. Definity contrast agent was given IV to delineate the left ventricular  endocardial borders. The left ventricular internal cavity size was normal in size. There is mild concentric left ventricular hypertrophy. Left ventricular diastolic parameters are consistent with Grade II diastolic dysfunction (pseudonormalization). Right Ventricle: The right ventricular size is normal. No increase in right ventricular wall thickness. Right ventricular systolic function is normal. There is mildly elevated pulmonary artery systolic pressure. The tricuspid regurgitant velocity is 2.82  m/s, and with an assumed right atrial pressure of 8 mmHg, the estimated right ventricular systolic pressure is 39.8 mmHg. Left Atrium: Left atrial size was moderately dilated. Right Atrium: Right atrial size was mild to moderately dilated. Pericardium: There is no evidence of pericardial effusion. Mitral Valve: The mitral valve is degenerative in appearance. Moderate mitral annular calcification. Mild mitral valve regurgitation. No evidence of mitral valve stenosis. MV peak gradient, 9.7 mmHg. The mean mitral valve gradient is 4.0 mmHg. Tricuspid Valve: The tricuspid valve is normal in structure. Tricuspid valve regurgitation is mild . No evidence of tricuspid stenosis. Aortic Valve: The  aortic valve is tricuspid. There is mild calcification of the aortic valve. Aortic valve regurgitation is trivial. Aortic valve sclerosis/calcification is present, without any evidence of aortic stenosis. Pulmonic Valve: The pulmonic valve was not well visualized. Pulmonic valve regurgitation is trivial. No evidence of pulmonic stenosis. Aorta: The aortic root is normal in size and structure. Venous: The inferior vena cava is dilated in size with greater than 50% respiratory variability, suggesting right atrial pressure of 8 mmHg. IAS/Shunts: No atrial level shunt detected by color flow Doppler.  LEFT VENTRICLE PLAX 2D LVIDd:         3.90 cm     Diastology LVIDs:         3.00 cm     LV e' medial:    4.47 cm/s LV PW:  1.10 cm     LV E/e' medial:  38.5 LV IVS:        1.10 cm     LV e' lateral:   5.22 cm/s LVOT diam:     2.00 cm     LV E/e' lateral: 33.0 LV SV:         89 LV SV Index:   42 LVOT Area:     3.14 cm  LV Volumes (MOD) LV vol d, MOD A4C: 63.8 ml LV vol s, MOD A4C: 31.1 ml LV SV MOD A4C:     63.8 ml RIGHT VENTRICLE             IVC RV S prime:     10.40 cm/s  IVC diam: 2.10 cm TAPSE (M-mode): 2.3 cm LEFT ATRIUM             Index        RIGHT ATRIUM           Index LA diam:        4.30 cm 2.05 cm/m   RA Area:     18.70 cm LA Vol (A2C):   93.9 ml 44.80 ml/m  RA Volume:   51.80 ml  24.72 ml/m LA Vol (A4C):   93.2 ml 44.47 ml/m LA Biplane Vol: 95.6 ml 45.62 ml/m  AORTIC VALVE LVOT Vmax:   114.00 cm/s LVOT Vmean:  82.100 cm/s LVOT VTI:    0.282 m  AORTA Ao Root diam: 2.70 cm Ao Asc diam:  3.40 cm MITRAL VALVE                TRICUSPID VALVE MV Area (PHT): 3.30 cm     TR Peak grad:   31.8 mmHg MV Area VTI:   1.92 cm     TR Vmax:        282.00 cm/s MV Peak grad:  9.7 mmHg MV Mean grad:  4.0 mmHg     SHUNTS MV Vmax:       1.56 m/s     Systemic VTI:  0.28 m MV Vmean:      91.5 cm/s    Systemic Diam: 2.00 cm MV Decel Time: 230 msec MR Peak grad: 71.9 mmHg MR Vmax:      424.00 cm/s MV E velocity: 172.00  cm/s MV A velocity: 77.90 cm/s MV E/A ratio:  2.21 Arvilla Meres MD Electronically signed by Arvilla Meres MD Signature Date/Time: 09/12/2022/10:37:29 AM    Final    US RENAL  Result Date: 09/11/2022 CLINICAL DATA:  Acute kidney injury EXAM: RENAL / URINARY TRACT ULTRASOUND COMPLETE COMPARISON:  CT 09/08/2022 FINDINGS: Right Kidney: Renal measurements: 11 x 4.3 x 4.5 cm = volume: 112 mL. Diffusely increased parenchymal echotexture suggesting fatty infiltration. No hydronephrosis. Two cysts demonstrated in the upper pole, largest measuring 2 cm diameter. No imaging follow-up is indicated. No solid mass lesions. Left Kidney: Renal measurements: 10.1 x 6.3 x 5.4 cm = volume: 178 mL. Diffusely increased parenchymal echotexture suggesting fatty infiltration. No hydronephrosis. No solid mass. Bladder: Appears normal for degree of bladder distention. Other: None. IMPRESSION: 1. Increased renal parenchymal echotexture bilaterally suggesting chronic medical renal disease. 2. No hydronephrosis. Electronically Signed   By: Burman Nieves M.D.   On: 09/11/2022 21:50    Anti-infectives: Anti-infectives (From admission, onward)    Start     Dose/Rate Route Frequency Ordered Stop   09/15/22 1400  DAPTOmycin (CUBICIN) 600 mg in sodium chloride 0.9 % IVPB  8 mg/kg  76.1 kg (Adjusted) 124 mL/hr over 30 Minutes Intravenous Every 48 hours 09/13/22 0857     09/13/22 1000  DAPTOmycin (CUBICIN) 600 mg in sodium chloride 0.9 % IVPB        8 mg/kg  76.1 kg (Adjusted) 124 mL/hr over 30 Minutes Intravenous Daily 09/13/22 0850 09/13/22 1142   09/12/22 1105  vancomycin variable dose per unstable renal function (pharmacist dosing)  Status:  Discontinued         Does not apply See admin instructions 09/12/22 1105 09/13/22 0850   09/11/22 0000  vancomycin (VANCOREADY) IVPB 750 mg/150 mL  Status:  Discontinued        750 mg 150 mL/hr over 60 Minutes Intravenous Every 48 hours 09/09/22 0010 09/12/22 1104    09/09/22 1200  ceFEPIme (MAXIPIME) 2 g in sodium chloride 0.9 % 100 mL IVPB        2 g 200 mL/hr over 30 Minutes Intravenous Every 24 hours 09/09/22 0010     09/09/22 1000  metroNIDAZOLE (FLAGYL) IVPB 500 mg        500 mg 100 mL/hr over 60 Minutes Intravenous Every 12 hours 09/09/22 0737     09/09/22 0045  vancomycin (VANCOREADY) IVPB 2000 mg/400 mL        2,000 mg 200 mL/hr over 120 Minutes Intravenous  Once 09/08/22 2358 09/09/22 0252   09/08/22 1345  piperacillin-tazobactam (ZOSYN) IVPB 3.375 g        3.375 g 100 mL/hr over 30 Minutes Intravenous  Once 09/08/22 1336 09/08/22 1444        Assessment/Plan Stage III gluteal pressure wound S/P I&D 7/16 Dr. Michaell Cowing  - BID dressing changes to largest portion of wound, PRN if soiled with BM - pressure redistribution as able - cultures pending - continue abx - updated one of her sons at the bedside  - will see again Friday   FEN: HH/CM diet  VTE: SQH ID: [email or web address]   - per primary -  COVID+ - diagnosed 09/06/22 MRSA bacteremia  AKI on CKD stage IIIb Anemia of chronic disease HTN T2DM Physical deconditioning    LOS: 5 days     Juliet Rude, [facility] Surgery 09/13/2022, 11:52 AM Please see Amion for pager number during day hours 7:00am-4:30pm

## 2022-09-13 NOTE — Progress Notes (Signed)
Patient was aspirating on her food so swallow evaluation order has been placed and notify to the MD too.

## 2022-09-13 NOTE — Hospital Course (Addendum)
89yow from [facility], PMH including stroke w/ RUE/RLE hemparesis, near bedbound, DM type 2, presented with worsening weakness, lethargy and back pain.  Diagnosed with COVID-19 on 09/06/2022 at ALF and started on Paxlovid. Was also started on Bactrim for diagnosis of cellulitis of the back.  On presentation, WBCs was 18.5, creatinine was 3.24.  CT of abdomen and pelvis without contrast showed right sacral wound extending from the skin surface to the outer surface of the gluteal musculature with significant gas collection.   7/12 Admitted for cellulitis, AKI, COVID. She was started on IV fluids and antibiotics.   7/13 General surgery was consulted.  Conservative management recommended. Respiratory status/SOB impaired exam. 7/14 Palliative care consulted for goals of care: full code, full scope at that time.   7/15 Nephrology consulted for AKI. Not a candidate for RRT. General surgery plans operative exploration  7/16 to OR for extensive debridement.  7/17 general surgery "Prognosis poor in this elderly woman with limited mobility. This decubitus ulcer will never heal and will be a chronic wound.  7/18 encephalopathic. Made DNR. 7/20 remains encephalopathic. Renal function improved. WBC higher. Prognosis guarded, may not survive this hospitalization. 7/23 cephalopathy resolved, progressing towards discharge likely in the next 48 hours.  Consultants General surgery 7/13 Nephrology  Palliative medicine 7/14  Procedures 7/16 INCISION AND DRAINAGE ABSCESS WITH EXTENSIVE DEBRIDEMENT

## 2022-09-13 NOTE — Progress Notes (Signed)
Physical Therapy Treatment Patient Details Name: [name] [name] MRN: [id number] DOB: [birth year] Today's Date: 09/13/2022   History of Present Illness Pt is an 89yo female admitted for cellutilis due to sacral wound and AKI, found to be COVID+ with severe weakness. PMH: DM, HTN, hx of CVA, HLD, CKD3.  CT abdomen and pelvis with right sacral wound extending from skin surface to the outer surface of gluteal musculature with significant gas collection.  General surgery also consulted and following.  Patient placed on IV fluids and antibiotics.  Wound care also following.  Patient being considered for debridement next week.    PT Comments  Pt resides at [facility] and was able to transfer herself to a wheelchair and use her feet to scoot along.  General Comments: eyes shut majority of session.  Did verbalize a few words.  Repeated one sentence, "I wish you would shut up".  Overall appears edenamous throughout.  Currently on 2 lts nasal at 99%.  Visible R UE tone(fingers and wrist flexed)  Required repeat instructions and met with poor participation/willingness. Assisted to EOB was VERY difficult.  General bed mobility comments: required increased assist this session due to level of lethargy.  Assisted to EOB required + 2 Total Assist.  Once upright and midline, pt was able to static sit at Supervision level.  Was hoping this activity would increase her alertness but did not.  Eyes shut most of session.  Daughter stated last BM was " a week ago" so assisted to [facility].  General transfer comment: pt was unable to grasp walker as prior session so needed + 2 side by side assist to partially stand and incomplete/near miss to [facility].  Pt was unable to support herself.  VERY heavy.  EDEMA throughout.  Sat on BSC x 5 min with eyes still shut.  Only spoke a few worlds.  Trial RA remained 94%.  Avg HR 68.  No void/No BM.  Had to call a third person  to get pt off BSC.  Required + 3 assist back to bed.  Positioned LEFT  sidelying and reapplied Prevalone Boots.  Rec MAXI MOVE.  Pt will need ST Rehab at SNF to address mobility and functional decline prior to safely returning home.     Assistance Recommended at Discharge Intermittent Supervision/Assistance  If plan is discharge home, recommend the following:  Can travel by private vehicle    A lot of help with walking and/or transfers;A lot of help with bathing/dressing/bathroom;Assistance with cooking/housework;Assist for transportation;Help with stairs or ramp for entrance   No  Equipment Recommendations  None recommended by PT    Recommendations for Other Services       Precautions / Restrictions Precautions Precautions: Fall Precaution Comments: right sacral wound I&D 09/12/22, R hemiparesis from previous CVA, COVID Restrictions Weight Bearing Restrictions: No     Mobility  Bed Mobility Overal bed mobility: Needs Assistance Bed Mobility: Supine to Sit, Sit to Supine     Supine to sit: Total assist, +2 for physical assistance, +2 for safety/equipment Sit to supine: Total assist, +2 for physical assistance, +2 for safety/equipment   General bed mobility comments: required increased assist this session due to level of lethargy.  Assisted to EOB required + 2 Total Assist.  Once upright and midline, pt was able to static sit at Supervision level.  Eyes shut most of session.    Transfers Overall transfer level: Needs assistance Equipment used: None     Stand pivot transfers: +2 physical  assistance, +2 safety/equipment, From elevated surface, Total assist, Max assist         General transfer comment: pt was unable to grasp walker as prior session so needed + 2 side by side assist to partially stand and incomplete/near miss to [facility].  Pt was unable to support herself.  VERY heavy.  EDEMA throughout.  Sat on BSC x 5 min with eyes still shut.  Only spoke a few worlds.  Trial RA remained 94%.  Avg HR 68.  Required + 3 assist back to bed.  Rec  MAXI MOVE.    Ambulation/Gait                   Stairs             Wheelchair Mobility     Tilt Bed    Modified Rankin (Stroke Patients Only)       Balance                                            Cognition Arousal/Alertness: Lethargic Behavior During Therapy: Flat affect Overall Cognitive Status: Impaired/Different from baseline Area of Impairment: Orientation, Attention, Memory, Following commands, Safety/judgement, Awareness                 Orientation Level: Disoriented to             General Comments: eyes shut majority of session.  Did verbalize a few words.  Repeated one sentence, "I wish you would shut up".  Overall appears edenamous throughout.  Currently on 2 lts nasal at 99%.  Visible R UE tone(fingers and wrist flexed)  Required repeat instructions and met with poor participation/willingness.        Exercises      General Comments        Pertinent Vitals/Pain Pain Assessment Pain Assessment: Faces Faces Pain Scale: Hurts little more Pain Location: bottom (wound area) Pain Descriptors / Indicators: Discomfort, Grimacing Pain Intervention(s): Monitored during session, Repositioned    Home Living                          Prior Function            PT Goals (current goals can now be found in the care plan section) Progress towards PT goals: Progressing toward goals    Frequency    Min 1X/week      PT Plan Current plan remains appropriate    Co-evaluation              AM-PAC PT "6 Clicks" Mobility   Outcome Measure  Help needed turning from your back to your side while in a flat bed without using bedrails?: Total Help needed moving from lying on your back to sitting on the side of a flat bed without using bedrails?: Total Help needed moving to and from a bed to a chair (including a wheelchair)?: Total Help needed standing up from a chair using your arms (e.g., wheelchair or  bedside chair)?: Total Help needed to walk in hospital room?: Total Help needed climbing 3-5 steps with a railing? : Total 6 Click Score: 6    End of Session Equipment Utilized During Treatment: Gait belt Activity Tolerance: Treatment limited secondary to medical complications (Comment) Patient left: in bed;with call bell/phone within reach Nurse Communication: Mobility status PT Visit Diagnosis: Other  abnormalities of gait and mobility (R26.89);Muscle weakness (generalized) (M62.81)     Time: 1610-9604 PT Time Calculation (min) (ACUTE ONLY): 27 min  Charges:    $Therapeutic Activity: 23-37 mins PT General Charges $$ ACUTE PT VISIT: 1 Visit                     Felecia Shelling  PTA Acute  Rehabilitation Services Office M-F          [phone]

## 2022-09-13 NOTE — Progress Notes (Signed)
Pharmacy Antibiotic Note  [name] [name] is a 89 y.o. female admitted on 09/08/2022 with  medical history significant of hypertension, diabetes mellitus type 2, prior stroke, pancreatitis, hyperlipidemia, CKD stage III, and morbid obesity presents to the ER from ALF due to severe weakness and lethargy. Pt with severe gluteal abscess s/p debridement in the OR on 7/16.  Cultures pending.  Scr 3.24 today  Plan: Change vancomycin to daptomycin 600mg  IV q48h Continue Cefepime 2gm IV q24h and metronidazole 500mg  IV q12h Follow renal function, cultures,  and clinical course  Height: 5\' 6"  (167.6 cm) Weight: 101.3 kg (223 lb 5.2 oz) IBW/kg (Calculated) : 59.3  Temp (24hrs), Avg:97.9 F (36.6 C), Min:97.4 F (36.3 C), Max:98.3 F (36.8 C)  Recent Labs  Lab 09/08/22 1323 09/08/22 1605 09/09/22 0529 09/10/22 0528 09/11/22 0517 09/12/22 0458 09/13/22 0537  WBC  --   --  16.7* 14.6* 15.5* 13.4* 14.7*  CREATININE  --    < > 2.82* 2.98* 3.38* 3.57* 3.24*  LATICACIDVEN 1.8  --   --   --   --   --   --   VANCORANDOM  --   --   --   --   --   --  11   < > = values in this interval not displayed.    Estimated Creatinine Clearance: 14.7 mL/min (A) (by C-G formula based on SCr of 3.24 mg/dL (H)).      Antimicrobials this admission: 7/12 zosyn x 1 7/13 vanc >>7/17 7/13 cefepime >> 7/17 daptomycin>> 7/13 metronidazole >>  Dose adjustments this admission:   Microbiology results: 7/12 BCx: staph epi in one set 7/14 BCx: no growth 7/12 MRSA PCR: not detected 7/16 abscess culture from OR - pending  Thank you for allowing pharmacy to be a part of this patient's care.  Celedonio Miyamoto, PharmD, [facility] Clinical Pharmacist Phone [phone]  09/13/2022, 8:54 AM

## 2022-09-13 NOTE — Progress Notes (Signed)
[facility] Progress Note    Assessment/ Plan:   AKI on CKD3b -followed by Dr. Signe Colt. BL Cr ~1.5-1.7. AKI likely secondary to ATN from sepsis and soft BPs. Potential recovery hindered by the use of lasix in the last 2 days and hyperglycemia. Agree with holding home chlorthalidone, losartan, farxiga, lasix -renal ultrasound without obstruction. UA pending -Cr stable. Volume status is reasonable today. C/w fluids: LR 100cc/hr x 1 day -given age/chronic back wound/functional status would recommend against renal replacement therapy if indicated. Discussed with patient and son at the bedside and she is in agreement (it is her preexisting wish to NOT dialysis if indicated given what she saw her husband go through) Appreciate palliative care's assistance -Avoid nephrotoxic medications including NSAIDs and iodinated intravenous contrast exposure unless the latter is absolutely indicated.  Preferred narcotic agents for pain control are hydromorphone, fentanyl, and methadone. Morphine should not be used. Avoid Baclofen and avoid oral sodium phosphate and magnesium citrate based laxatives / bowel preps. Continue strict Input and Output monitoring. Will monitor the patient closely with you and intervene or adjust therapy as indicated by changes in clinical status/labs    Rt buttock cellulitis MRSA bacteremia -on cefepime, dapt, flagyl. Per primary. Surgery following, s/p debridement 7/16   COVID19 -supportive care per primary service   Uncontrolled DM2 with hyperglycemia -per primary service   Anemia of chronic disease -transfuse prn for Hgb <7. Avoid IV Fe. Hgb 9.6   HTN -BP acceptable   Hyponatremia -component of pseudohypoNa secondary to hyperglycemia and hypoNa secondary to AKI. Isotonic fluids as above   Hypoalbuminemia -push protein  Discussed with RN. Discussed with [name] (daughter) bedside and Link Snuffer (son) over speakerphone.  Anthony Sar, MD [facility]  Subjective:   No acute events. Denies any chest pain, SOB. Daughter reports that her swelling is actually better   Objective:   BP 109/71   Pulse 65   Temp 98.3 F (36.8 C)   Resp 17   Ht 5\' 6"  (1.676 m)   Wt 101.3 kg   SpO2 100%   BMI 36.05 kg/m   Intake/Output Summary (Last 24 hours) at 09/13/2022 1303 Last data filed at 09/12/2022 2143 Gross per 24 hour  Intake 520 ml  Output 10 ml  Net 510 ml   Weight change:   Physical Exam: Gen: chronically ill appearing CVS: RRR Resp: normal WOB Abd: obese Ext: very trace edema b/l Les, b/l UE swelling (rt>lt, RUE with PIV) Neuro: awake, alert  Imaging: ECHOCARDIOGRAM COMPLETE  Result Date: 09/12/2022    ECHOCARDIOGRAM REPORT   Patient Name:   [name] [name] Date of Exam: 09/12/2022 Medical Rec #:  [id number]        Height:       66.0 in Accession #:    [id number]       Weight:       223.3 lb Date of Birth:  16-Jan-1933        BSA:          2.096 m Patient Age:    89 years         BP:           132/66 mmHg Patient Gender: F                HR:           63 bpm. Exam Location:  Inpatient Procedure: 2D Echo, Cardiac Doppler, Color Doppler and Intracardiac  Opacification Agent Indications:    Bacteremia  History:        Patient has prior history of Echocardiogram examinations, most                 recent 01/05/2020. Stroke, Signs/Symptoms:Weakness/Lethargy; Risk                 Factors:Hypertension, Diabetes, Dyslipidemia and Obesity. CKD.  Sonographer:    Milda Smart Referring Phys: [id number] [facility]  Sonographer Comments: Patient is obese. Image acquisition challenging due to patient body habitus and Image acquisition challenging due to respiratory motion. IMPRESSIONS  1. Left ventricular ejection fraction, by estimation, is 60 to 65%. The left ventricle has normal function. The left ventricle has no regional wall motion abnormalities. There is mild concentric left ventricular hypertrophy. Left ventricular diastolic  parameters are consistent with Grade II diastolic dysfunction (pseudonormalization).  2. Right ventricular systolic function is normal. The right ventricular size is normal. There is mildly elevated pulmonary artery systolic pressure. The estimated right ventricular systolic pressure is 39.8 mmHg.  3. Left atrial size was moderately dilated.  4. Right atrial size was mild to moderately dilated.  5. The mitral valve is degenerative. Mild mitral valve regurgitation. No evidence of mitral stenosis. Moderate mitral annular calcification.  6. The aortic valve is tricuspid. There is mild calcification of the aortic valve. Aortic valve regurgitation is trivial. Aortic valve sclerosis/calcification is present, without any evidence of aortic stenosis.  7. The inferior vena cava is dilated in size with >50% respiratory variability, suggesting right atrial pressure of 8 mmHg. Conclusion(s)/Recommendation(s): No evidence of valvular vegetations on this transthoracic echocardiogram. Consider a transesophageal echocardiogram to exclude infective endocarditis if clinically indicated. FINDINGS  Left Ventricle: Left ventricular ejection fraction, by estimation, is 60 to 65%. The left ventricle has normal function. The left ventricle has no regional wall motion abnormalities. Definity contrast agent was given IV to delineate the left ventricular  endocardial borders. The left ventricular internal cavity size was normal in size. There is mild concentric left ventricular hypertrophy. Left ventricular diastolic parameters are consistent with Grade II diastolic dysfunction (pseudonormalization). Right Ventricle: The right ventricular size is normal. No increase in right ventricular wall thickness. Right ventricular systolic function is normal. There is mildly elevated pulmonary artery systolic pressure. The tricuspid regurgitant velocity is 2.82  m/s, and with an assumed right atrial pressure of 8 mmHg, the estimated right ventricular  systolic pressure is 39.8 mmHg. Left Atrium: Left atrial size was moderately dilated. Right Atrium: Right atrial size was mild to moderately dilated. Pericardium: There is no evidence of pericardial effusion. Mitral Valve: The mitral valve is degenerative in appearance. Moderate mitral annular calcification. Mild mitral valve regurgitation. No evidence of mitral valve stenosis. MV peak gradient, 9.7 mmHg. The mean mitral valve gradient is 4.0 mmHg. Tricuspid Valve: The tricuspid valve is normal in structure. Tricuspid valve regurgitation is mild . No evidence of tricuspid stenosis. Aortic Valve: The aortic valve is tricuspid. There is mild calcification of the aortic valve. Aortic valve regurgitation is trivial. Aortic valve sclerosis/calcification is present, without any evidence of aortic stenosis. Pulmonic Valve: The pulmonic valve was not well visualized. Pulmonic valve regurgitation is trivial. No evidence of pulmonic stenosis. Aorta: The aortic root is normal in size and structure. Venous: The inferior vena cava is dilated in size with greater than 50% respiratory variability, suggesting right atrial pressure of 8 mmHg. IAS/Shunts: No atrial level shunt detected by color flow Doppler.  LEFT VENTRICLE PLAX 2D LVIDd:  3.90 cm     Diastology LVIDs:         3.00 cm     LV e' medial:    4.47 cm/s LV PW:         1.10 cm     LV E/e' medial:  38.5 LV IVS:        1.10 cm     LV e' lateral:   5.22 cm/s LVOT diam:     2.00 cm     LV E/e' lateral: 33.0 LV SV:         89 LV SV Index:   42 LVOT Area:     3.14 cm  LV Volumes (MOD) LV vol d, MOD A4C: 63.8 ml LV vol s, MOD A4C: 31.1 ml LV SV MOD A4C:     63.8 ml RIGHT VENTRICLE             IVC RV S prime:     10.40 cm/s  IVC diam: 2.10 cm TAPSE (M-mode): 2.3 cm LEFT ATRIUM             Index        RIGHT ATRIUM           Index LA diam:        4.30 cm 2.05 cm/m   RA Area:     18.70 cm LA Vol (A2C):   93.9 ml 44.80 ml/m  RA Volume:   51.80 ml  24.72 ml/m LA Vol (A4C):    93.2 ml 44.47 ml/m LA Biplane Vol: 95.6 ml 45.62 ml/m  AORTIC VALVE LVOT Vmax:   114.00 cm/s LVOT Vmean:  82.100 cm/s LVOT VTI:    0.282 m  AORTA Ao Root diam: 2.70 cm Ao Asc diam:  3.40 cm MITRAL VALVE                TRICUSPID VALVE MV Area (PHT): 3.30 cm     TR Peak grad:   31.8 mmHg MV Area VTI:   1.92 cm     TR Vmax:        282.00 cm/s MV Peak grad:  9.7 mmHg MV Mean grad:  4.0 mmHg     SHUNTS MV Vmax:       1.56 m/s     Systemic VTI:  0.28 m MV Vmean:      91.5 cm/s    Systemic Diam: 2.00 cm MV Decel Time: 230 msec MR Peak grad: 71.9 mmHg MR Vmax:      424.00 cm/s MV E velocity: 172.00 cm/s MV A velocity: 77.90 cm/s MV E/A ratio:  2.21 Arvilla Meres MD Electronically signed by Arvilla Meres MD Signature Date/Time: 09/12/2022/10:37:29 AM    Final    US RENAL  Result Date: 09/11/2022 CLINICAL DATA:  Acute kidney injury EXAM: RENAL / URINARY TRACT ULTRASOUND COMPLETE COMPARISON:  CT 09/08/2022 FINDINGS: Right Kidney: Renal measurements: 11 x 4.3 x 4.5 cm = volume: 112 mL. Diffusely increased parenchymal echotexture suggesting fatty infiltration. No hydronephrosis. Two cysts demonstrated in the upper pole, largest measuring 2 cm diameter. No imaging follow-up is indicated. No solid mass lesions. Left Kidney: Renal measurements: 10.1 x 6.3 x 5.4 cm = volume: 178 mL. Diffusely increased parenchymal echotexture suggesting fatty infiltration. No hydronephrosis. No solid mass. Bladder: Appears normal for degree of bladder distention. Other: None. IMPRESSION: 1. Increased renal parenchymal echotexture bilaterally suggesting chronic medical renal disease. 2. No hydronephrosis. Electronically Signed   By: Burman Nieves M.D.   On: 09/11/2022 21:50  Labs: BMET Recent Labs  Lab 09/08/22 1234 09/08/22 1605 09/09/22 0529 09/10/22 0528 09/11/22 0517 09/12/22 0458 09/13/22 0537  NA 130* 132* 131* 133* 130* 129* 129*  K 4.4 4.3 4.4 4.8 4.2 4.2 4.9  CL 95* 97* 96* 98 97* 96* 98  CO2 21* 22 21*  19* 20* 21* 18*  GLUCOSE 176* 206* 259* 375* 301* 263* 351*  BUN 51* 50* 55* 68* 96* 111* 121*  CREATININE 3.24* 2.90* 2.82* 2.98* 3.38* 3.57* 3.24*  CALCIUM 7.4* 7.4* 7.3* 7.7* 7.6* 7.4* 7.6*   CBC Recent Labs  Lab 09/10/22 0528 09/11/22 0517 09/12/22 0458 09/13/22 0537  WBC 14.6* 15.5* 13.4* 14.7*  NEUTROABS 13.6* 13.8* 11.7* 12.5*  HGB 9.5* 9.5* 9.2* 9.6*  HCT 30.2* 30.0* 27.6* 30.5*  MCV 82.3 81.5 78.9* 81.6  PLT 261 309 330 348    Medications:     heparin injection (subcutaneous)  5,000 Units Subcutaneous Q8H   hydrALAZINE  100 mg Oral TID   insulin aspart  0-20 Units Subcutaneous TID WC   insulin aspart  0-5 Units Subcutaneous QHS   insulin aspart  20 Units Subcutaneous TID WC   insulin glargine-yfgn  35 Units Subcutaneous BID   ipratropium-albuterol  3 mL Nebulization BID   metoprolol tartrate  100 mg Oral BID   polycarbophil  625 mg Oral BID      Anthony Sar, MD  [facility] 09/13/2022, 1:03 PM

## 2022-09-14 DIAGNOSIS — R410 Disorientation, unspecified: Secondary | ICD-10-CM | POA: Diagnosis not present

## 2022-09-14 DIAGNOSIS — U071 COVID-19: Secondary | ICD-10-CM | POA: Diagnosis not present

## 2022-09-14 DIAGNOSIS — N179 Acute kidney failure, unspecified: Secondary | ICD-10-CM | POA: Diagnosis not present

## 2022-09-14 DIAGNOSIS — L0231 Cutaneous abscess of buttock: Secondary | ICD-10-CM | POA: Diagnosis not present

## 2022-09-14 LAB — CBC
HCT: 31 % — ABNORMAL LOW (ref 36.0–46.0)
Hemoglobin: 9.7 g/dL — ABNORMAL LOW (ref 12.0–15.0)
MCH: 25.3 pg — ABNORMAL LOW (ref 26.0–34.0)
MCHC: 31.3 g/dL (ref 30.0–36.0)
MCV: 80.9 fL (ref 80.0–100.0)
Platelets: 426 10*3/uL — ABNORMAL HIGH (ref 150–400)
RBC: 3.83 MIL/uL — ABNORMAL LOW (ref 3.87–5.11)
RDW: 16 % — ABNORMAL HIGH (ref 11.5–15.5)
WBC: 17.8 10*3/uL — ABNORMAL HIGH (ref 4.0–10.5)
nRBC: 1.2 % — ABNORMAL HIGH (ref 0.0–0.2)

## 2022-09-14 LAB — GLUCOSE, CAPILLARY
Glucose-Capillary: 198 mg/dL — ABNORMAL HIGH (ref 70–99)
Glucose-Capillary: 217 mg/dL — ABNORMAL HIGH (ref 70–99)
Glucose-Capillary: 198 mg/dL — ABNORMAL HIGH (ref 70–99)
Glucose-Capillary: 182 mg/dL — ABNORMAL HIGH (ref 70–99)
Glucose-Capillary: 178 mg/dL — ABNORMAL HIGH (ref 70–99)

## 2022-09-14 LAB — BLOOD GAS, ARTERIAL
Acid-base deficit: 1.2 mmol/L (ref 0.0–2.0)
Bicarbonate: 23.6 mmol/L (ref 20.0–28.0)
Drawn by: 29503
O2 Saturation: 99.1 %
Patient temperature: 37
pCO2 arterial: 39 mmHg (ref 32–48)
pH, Arterial: 7.39 (ref 7.35–7.45)
pO2, Arterial: 140 mmHg — ABNORMAL HIGH (ref 83–108)

## 2022-09-14 LAB — BASIC METABOLIC PANEL
Anion gap: 11 (ref 5–15)
BUN: 110 mg/dL — ABNORMAL HIGH (ref 8–23)
CO2: 21 mmol/L — ABNORMAL LOW (ref 22–32)
Calcium: 7.9 mg/dL — ABNORMAL LOW (ref 8.9–10.3)
Chloride: 104 mmol/L (ref 98–111)
Creatinine, Ser: 2.55 mg/dL — ABNORMAL HIGH (ref 0.44–1.00)
GFR, Estimated: 18 mL/min — ABNORMAL LOW (ref >60)
Glucose, Bld: 211 mg/dL — ABNORMAL HIGH (ref 70–99)
Potassium: 4.4 mmol/L (ref 3.5–5.1)
Sodium: 136 mmol/L (ref 135–145)

## 2022-09-14 LAB — CK: Total CK: 116 U/L (ref 38–234)

## 2022-09-14 LAB — SURGICAL PATHOLOGY

## 2022-09-14 MED ORDER — FLUCONAZOLE IN SODIUM CHLORIDE 200-0.9 MG/100ML-% IV SOLN
200.0000 mg | Freq: Every day | INTRAVENOUS | Status: DC
  Administered 2022-09-15 – 2022-09-19 (×5): 200 mg via INTRAVENOUS
  Filled 2022-09-14 (×7): qty 100

## 2022-09-14 MED ORDER — HYDROMORPHONE HCL 1 MG/ML IJ SOLN
0.5000 mg | INTRAMUSCULAR | Status: DC | PRN
  Administered 2022-09-14 – 2022-09-15 (×3): 0.5 mg via INTRAVENOUS
  Filled 2022-09-14 (×3): qty 0.5

## 2022-09-14 MED ORDER — METOPROLOL TARTRATE 5 MG/5ML IV SOLN
5.0000 mg | Freq: Three times a day (TID) | INTRAVENOUS | Status: DC
  Administered 2022-09-14 – 2022-09-15 (×4): 5 mg via INTRAVENOUS
  Filled 2022-09-14 (×4): qty 5

## 2022-09-14 MED ORDER — HYDRALAZINE HCL 20 MG/ML IJ SOLN
5.0000 mg | Freq: Four times a day (QID) | INTRAMUSCULAR | Status: DC | PRN
  Administered 2022-09-14 – 2022-09-16 (×2): 5 mg via INTRAVENOUS
  Filled 2022-09-14 (×2): qty 1

## 2022-09-14 MED ORDER — LACTATED RINGERS IV SOLN: INTRAVENOUS | Status: AC

## 2022-09-14 MED ORDER — FLUCONAZOLE IN SODIUM CHLORIDE 400-0.9 MG/200ML-% IV SOLN
400.0000 mg | Freq: Once | INTRAVENOUS | Status: AC
  Administered 2022-09-14: 400 mg via INTRAVENOUS
  Filled 2022-09-14: qty 200

## 2022-09-14 NOTE — Evaluation (Signed)
Clinical/Bedside Swallow Evaluation Patient Details  Name: [name] [name] MRN: [id number] Date of Birth: Oct 03, 1932  Today's Date: 09/14/2022 Time: SLP Start Time (ACUTE ONLY): 1240 SLP Stop Time (ACUTE ONLY): 1252 SLP Time Calculation (min) (ACUTE ONLY): 12 min  Past Medical History:  Past Medical History:  Diagnosis Date   Colon polyp    Diabetes mellitus 1992   Hypertension 1996   Pancreatitis    Past Surgical History:  Past Surgical History:  Procedure Laterality Date   CHOLECYSTECTOMY     COLONOSCOPY     ERCP     INCISION AND DRAINAGE ABSCESS N/A 09/12/2022   Procedure: INCISION AND DRAINAGE ABSCESS;  Surgeon: Karie Soda, MD;  Location: [facility];  Service: General;  Laterality: N/A;   PARTIAL HYSTERECTOMY  89years old   POLYPECTOMY     UPPER GASTROINTESTINAL ENDOSCOPY     WRIST FRACTURE SURGERY  left arm,89years old   HPI:  89 y.o. female admitted on 09/08/2022 with  medical history significant of hypertension, diabetes mellitus type 2, prior stroke, pancreatitis, hyperlipidemia, CKD stage III, and morbid obesity presents to the ER from ALF due to severe weakness and lethargy. Pt with severe gluteal abscess s/p debridement in the OR on 7/16.  She began having AMS on 7/17 and has been lethargic and overtly coughing on secretions and with po.  Prior MBS completed in 2021 after CVA  (left thalamic hematoma) revealed no aspiration or penetration, swallow trigger at vallecular space, minimal retention cleared with ddry swallow. BSE ordered.   Family denies pt having issues with swallowing prior to admit.    Assessment / Plan / Recommendation  Clinical Impression  Patient greeted lying in bed with son present.  She was sleeping and required max cues to wake and only followed single step direction x2 (to open mouth) with total verbal/visual cues and repetition.  Upon examination of oral cavity, she is noted to have severe white coating in oral cavity including tongue, bilateral buccal  region, palate - consistent with severe oral candidiasis.  Pt admits to lingual discomfort and odynophagia.  Daughter arrived and reports pt told her that she was feeling "crystals" on her tongue prior to admit - that abated after admission.    Today pt allowed SLP to place *push* 1/4 inch size ice chip into her oral cavity. She retained in anterior area with severely delayed swallow followed by explosive coughing with erythemic face and obvious discomfort.  At this time Ms Driskill is not appropriate for any po intake - Recommend strict npo - if pt asks for water - advised family they could do 1/4 inch of water with pt fully alert and upright.  Asked RN to set up oral suction for emergent use.  Communicated with MD and pt now started on Diflucan. Per his wishes, SLP will follow up Saturday 7/20 unless MD contact our department to indicate she is ready for speech swallow treatment before then.  Anticipate it may take a few days for Diflucan to her candidiasis.  Son and daughter educated and agreeable to plan. Spoke to Felton on the phone and relayed the information re: care plan and she advised she would inform her brother.   Perhaps with improved mentation and Diflucan treatment pt's swallow will improve to allow po intake. Thanks for this consult. SLP Visit Diagnosis: Dysphagia, oropharyngeal phase (R13.12)    Aspiration Risk  Severe aspiration risk;Risk for inadequate nutrition/hydration    Diet Recommendation NPO    Medication Administration: Via alternative means  Other  Recommendations Oral Care Recommendations: Other (Comment);Oral care QID (as pt will allow and md indicates)    Recommendations for follow up therapy are one component of a multi-disciplinary discharge planning process, led by the attending physician.  Recommendations may be updated based on patient status, additional functional criteria and insurance authorization.  Follow up Recommendations Skilled nursing-short term rehab (<3  hours/day)      Assistance Recommended at Discharge    Functional Status Assessment Patient has had a recent decline in their functional status and/or demonstrates limited ability to make significant improvements in function in a reasonable and predictable amount of time  Frequency and Duration min 1 x/week  1 week       Prognosis Prognosis for improved oropharyngeal function: Fair Barriers to Reach Goals: Other (Comment) (current mentation)      Swallow Study   General Date of Onset: 09/14/22 HPI: 89 y.o. female admitted on 09/08/2022 with  medical history significant of hypertension, diabetes mellitus type 2, prior stroke, pancreatitis, hyperlipidemia, CKD stage III, and morbid obesity presents to the ER from ALF due to severe weakness and lethargy. Pt with severe gluteal abscess s/p debridement in the OR on 7/16.  She began having AMS on 7/17 and has been lethargic and overtly coughing on secretions and with po.  Prior MBS completed in 2021 after CVA  (left thalamic hematoma) revealed no aspiration or penetration, swallow trigger at vallecular space, minimal retention cleared with ddry swallow. BSE ordered. Type of Study: Bedside Swallow Evaluation Diet Prior to this Study: NPO Temperature Spikes Noted: No Respiratory Status: Nasal cannula History of Recent Intubation: No Behavior/Cognition: Doesn't follow directions;Lethargic/Drowsy;Requires cueing (inconsistently following only a single one-step directions with repetition and visual cue) Oral Care Completed by SLP: Other (Comment) (pt resistant to SLP attempting any po or oral care) Oral Cavity - Dentition: Adequate natural dentition;Other (Comment) (some missing dentition) Self-Feeding Abilities: Total assist Patient Positioning: Upright in bed Baseline Vocal Quality: Low vocal intensity Volitional Cough: Cognitively unable to elicit Volitional Swallow: Unable to elicit    Oral/Motor/Sensory Function Overall Oral Motor/Sensory  Function: Generalized oral weakness (pt able to seal her lips closed to try to prevent tiny ice chip placement)   Ice Chips Ice chips: Impaired Presentation: Spoon Oral Phase Impairments: Reduced labial seal;Reduced lingual movement/coordination;Poor awareness of bolus Oral Phase Functional Implications: Oral holding;Prolonged oral transit Pharyngeal Phase Impairments: Suspected delayed Swallow;Cough - Immediate   Thin Liquid Thin Liquid: Not tested    Nectar Thick Nectar Thick Liquid: Not tested   Honey Thick Honey Thick Liquid: Not tested   Puree Puree: Not tested   Solid     Solid: Not tested      Chales Abrahams 09/14/2022,2:33 PM  Rolena Infante, MS [facility] SLP Acute Rehab Services Office [phone]

## 2022-09-14 NOTE — NC FL2 (Signed)
[place] MEDICAID FL2 LEVEL OF CARE FORM     IDENTIFICATION  Patient Name: [name] [name] Birthdate: 07/20/1932 Sex: female Admission Date (Current Location): 09/08/2022  [facility] and [place] Number:  Producer, television/film/video and Address:  [facility],  [address] [place], [place] [address]      Provider Number: [id number]  Attending Physician Name and Address:  Standley Brooking, MD  Relative Name and Phone Number:  Genesis Novosad [phone]    Current Level of Care: Hospital Recommended Level of Care: Skilled Nursing Facility Prior Approval Number:    Date Approved/Denied:   PASRR Number: [id number]  Discharge Plan: SNF    Current Diagnoses: Patient Active Problem List   Diagnosis Date Noted   AKI (acute kidney injury) (HCC) 09/13/2022   COVID-19 virus infection 09/13/2022   Delirium 09/13/2022   Gluteal abscess 09/08/2022   Moderate nonproliferative diabetic retinopathy of both eyes without macular edema associated with type 2 diabetes mellitus (HCC) 07/18/2021   Benign essential HTN    Primary osteoarthritis of right knee    Slow transit constipation    Labile blood pressure    Controlled type 2 diabetes mellitus with hyperglycemia (HCC)    Labile blood glucose    Acute renal failure superimposed on chronic kidney disease (HCC)    Anxiety state    Nontraumatic acute hemorrhage of basal ganglia (HCC) 01/16/2020   Lethargy    Dysphagia    Leukocytosis    Chronic kidney disease (CKD), stage III (moderate) (HCC) 01/10/2020   Morbid obesity (HCC)    Chronic diastolic congestive heart failure (HCC)    Uncontrolled type 2 diabetes mellitus with hyperglycemia (HCC)    Essential hypertension    FUO (fever of unknown origin)    ICH (intracerebral hemorrhage) (HCC) 01/04/2020   Left epiretinal membrane 12/10/2019   Pseudophakia, both eyes 12/10/2019   DIABETES MELLITUS-TYPE II 06/01/2009   HYPERLIPIDEMIA 06/01/2009   HYPERTENSION 06/01/2009    CHOLELITHIASIS 06/01/2009   PANCREATITIS 06/01/2009   PERSONAL HX COLONIC POLYPS 06/01/2009    Orientation RESPIRATION BLADDER Height & Weight     Self  Normal Incontinent Weight: 101.3 kg Height:  5\' 6"  (167.6 cm)  BEHAVIORAL SYMPTOMS/MOOD NEUROLOGICAL BOWEL NUTRITION STATUS      Incontinent Diet (See discharge summary)  AMBULATORY STATUS COMMUNICATION OF NEEDS Skin   Total Care Verbally Other (Comment), PU Stage and Appropriate Care (Buttocks Right Unstageable - Full thickness tissue loss in which the base of the injury is covered by slough)                       Personal Care Assistance Level of Assistance  Bathing, Feeding, Dressing Bathing Assistance: Maximum assistance Feeding assistance: Limited assistance Dressing Assistance: Maximum assistance     Functional Limitations Info  Sight, Hearing, Speech Sight Info: Adequate Hearing Info: Adequate Speech Info: Adequate    SPECIAL CARE FACTORS FREQUENCY  PT (By licensed PT), OT (By licensed OT)     PT Frequency: 5x/wk OT Frequency: 5x/wk            Contractures Contractures Info: Not present    Additional Factors Info  Code Status, Allergies Code Status Info: DNR Allergies Info: Amlodipine, Ace Inhibitors, Atorvastatin, Canagliflozin, Clonidine Hcl, Buprenorphine, Lisinopril, Morphine And Codeine           Current Medications (09/14/2022):  This is the current hospital active medication list Current Facility-Administered Medications  Medication Dose Route Frequency Provider Last Rate Last Admin  acetaminophen (TYLENOL) suppository 650 mg  650 mg Rectal Q6H PRN Karie Soda, MD       acetaminophen (TYLENOL) tablet 650 mg  650 mg Oral Q6H PRN Karie Soda, MD   650 mg at 09/11/22 1454   albuterol (PROVENTIL) (2.5 MG/3ML) 0.083% nebulizer solution 3 mL  3 mL Inhalation Q2H PRN Karie Soda, MD       ceFEPIme (MAXIPIME) 2 g in sodium chloride 0.9 % 100 mL IVPB  2 g Intravenous Q24H Karie Soda, MD 200  mL/hr at 09/14/22 1152 2 g at 09/14/22 1152   [START ON 09/15/2022] DAPTOmycin (CUBICIN) 600 mg in sodium chloride 0.9 % IVPB  8 mg/kg (Adjusted) Intravenous Q48H Standley Brooking, MD       fluconazole (DIFLUCAN) IVPB 400 mg  400 mg Intravenous Once Standley Brooking, MD 100 mL/hr at 09/14/22 1510 400 mg at 09/14/22 1510   Followed by   Melene Muller ON 09/15/2022] fluconazole (DIFLUCAN) IVPB 200 mg  200 mg Intravenous Daily Standley Brooking, MD       guaiFENesin-dextromethorphan (ROBITUSSIN DM) 100-10 MG/5ML syrup 5 mL  5 mL Oral Q4H PRN Karie Soda, MD   5 mL at 09/09/22 1238   heparin injection 5,000 Units  5,000 Units Subcutaneous Trixie Deis, MD   5,000 Units at 09/14/22 1307   hydrALAZINE (APRESOLINE) injection 5 mg  5 mg Intravenous Q6H PRN Standley Brooking, MD   5 mg at 09/14/22 1610   hydrALAZINE (APRESOLINE) tablet 100 mg  100 mg Oral TID Karie Soda, MD   100 mg at 09/13/22 1756   HYDROmorphone (DILAUDID) injection 0.5 mg  0.5 mg Intravenous Q4H PRN Standley Brooking, MD   0.5 mg at 09/14/22 1306   insulin aspart (novoLOG) injection 0-20 Units  0-20 Units Subcutaneous TID WC Karie Soda, MD   4 Units at 09/14/22 1303   insulin aspart (novoLOG) injection 0-5 Units  0-5 Units Subcutaneous Laurena Slimmer, MD   3 Units at 09/12/22 2137   insulin aspart (novoLOG) injection 20 Units  20 Units Subcutaneous TID WC Karie Soda, MD   20 Units at 09/13/22 1249   insulin glargine-yfgn (SEMGLEE) injection 15 Units  15 Units Subcutaneous BID Standley Brooking, MD   15 Units at 09/14/22 1030   ipratropium-albuterol (DUONEB) 0.5-2.5 (3) MG/3ML nebulizer solution 3 mL  3 mL Nebulization BID Karie Soda, MD   3 mL at 09/14/22 1003   lactated ringers infusion   Intravenous Continuous Anthony Sar, MD 75 mL/hr at 09/14/22 1314 New Bag at 09/14/22 1314   methocarbamol (ROBAXIN) tablet 1,000 mg  1,000 mg Oral Q6H PRN Karie Soda, MD       metoprolol tartrate (LOPRESSOR) injection 5 mg  5  mg Intravenous Q8H Standley Brooking, MD   5 mg at 09/14/22 1308   metroNIDAZOLE (FLAGYL) IVPB 500 mg  500 mg Intravenous Catha Gosselin, MD   Stopped at 09/14/22 1100   ondansetron (ZOFRAN) tablet 4 mg  4 mg Oral Q6H PRN Karie Soda, MD       Or   ondansetron [facility]) injection 4 mg  4 mg Intravenous Q6H PRN Karie Soda, MD       Oral care mouth rinse  15 mL Mouth Rinse PRN Gross, Viviann Spare, MD       polycarbophil (FIBERCON) tablet 625 mg  625 mg Oral BID Karie Soda, MD   625 mg at 09/13/22 0944   polyethylene glycol (MIRALAX / GLYCOLAX) packet 17 g  17 g Oral Daily PRN Karie Soda, MD   17 g at 09/13/22 0944   QUEtiapine (SEROQUEL) tablet 12.5 mg  12.5 mg Oral QHS PRN Karie Soda, MD   12.5 mg at 09/11/22 2153     Discharge Medications: Please see discharge summary for a list of discharge medications.  Relevant Imaging Results:  Relevant Lab Results:   Additional Information SSN: [id number]  Otelia Santee, LCSW

## 2022-09-14 NOTE — Progress Notes (Signed)
Progress Note   Patient: [name] [name]:[id number] DOB: 1932-04-27 DOA: 09/08/2022     6 DOS: the patient was seen and examined on 09/14/2022   Brief hospital course: 89yow from [facility], PMH including stroke w/ RUE/RLE hemparesis, near bedbound, DM type 2, presented with worsening weakness, lethargy and back pain.  Diagnosed with COVID-19 on 09/06/2022 at ALF and started on Paxlovid. Was also started on Bactrim for diagnosis of cellulitis of the back.  On presentation, WBCs was 18.5, creatinine was 3.24.  CT of abdomen and pelvis without contrast showed right sacral wound extending from the skin surface to the outer surface of the gluteal musculature with significant gas collection.   7/12 Admitted for cellulitis, AKI, COVID. She was started on IV fluids and antibiotics.   7/13 General surgery was consulted.  Conservative management recommended. Respiratory status/SOB impaired exam. 7/14 Palliative care consulted for goals of care: full code, full scope at that time.   7/15 Nephrology consulted for AKI. Not a candidate for RRT. General surgery plans operative exploration  7/16 to OR for extensive debridement.  7/17 general surgery "Prognosis poor in this elderly woman with limited mobility. This decubitus ulcer will never heal and will be a chronic wound.  7/18 encephalopathic. Made DNR.  Consultants General surgery 7/13 Nephrology  Palliative medicine 7/14  Procedures 7/16 INCISION AND DRAINAGE ABSCESS WITH EXTENSIVE DEBRIDEMENT   Assessment and Plan: Right posterior gluteal decubitus abscess with wet gangrene and necrosis Status post incision and drainage with extensive debridement 7/16 per general surgery.  Blood culture Staphylococcus epidermidis in 1 set.  Contaminant.  Repeat cultures from 09/10/2022 have been negative so far.   Continue broad-spectrum antibiotics.   Continue management per general surgery. Have asked surgery to discuss with family.   Acute kidney  injury on chronic kidney disease stage IIIb Acute metabolic acidosis Thought secondary to ATN from infection, soft BPs, Lasix. Treated with albumein. Creatinine baseline 1.4-1.8.  Creatinine 3.04 on presentation.   Renal ultrasound showed no hydronephrosis.   Creatinine 3.57 > 3.24 > 2.55   Dysphagia Acute delirium Acute metabolic encephalopathy Suspect dysphagia secondary to acute delirium secondary to acute infection, perioperative state. N.p.o. for now.  Speech therapy evaluation.  Continue IV fluids. More encephalopathic today.  Check ABG to rule out CO2 retention.  Suspect encephalopathy is related to infection and uremia.  COVID-19 Diagnosed on 09/06/2022.  Continue isolation.  Given renal failure, Paxlovid held. Continue supportive care. Respiratory status remains stable    Constipation.  No bowel movement in 1 week. Currently NPO.  Suppository ineffective.  Repeat today.  Enema of can cooperate if needed.   Hyponatremia Resolved.   Normocytic anemia Probably secondary to acute illness.  Follow-up as an outpatient.   Essential hypertension Stable.  Continue hydralazine and metoprolol.     Diabetes mellitus type 2 with hyperglycemia A1c 9.8.   Stable.  Continue sliding scale insulin, meal coverage, Semglee   Physical deconditioning PT recommending SNF placement.  TOC following   Goals of care DNR now. See separate note   PMH stroke with RUE and RLE hemiparesis  More encephalopathic today.  Renal function a bit better but leukocytosis worse.  Concern condition is worsening despite surgery and antibiotics.  She may not recover from this infection and insult to her kidneys.  Discussed in detail with family.  Will ask surgery to see patient today to rule evaluate wound and discuss with family.  Prognosis guarded.  May not survive this hospitalization.  Subjective:  RR called by RN More confused this AM, vitals stable Patient answers some simple questions but history  is limited and unreliable, denies pain/SOB Son, daughter at bedside  Physical Exam: Vitals:   09/13/22 2009 09/13/22 2017 09/14/22 0619 09/14/22 0902  BP:  (!) 188/55 (!) 188/60 (!) 182/59  Pulse:  65 83 83  Resp:  17 18   Temp:   98.4 F (36.9 C)   TempSrc:   Oral   SpO2: 94% 99% 97% 98%  Weight:      Height:       Physical Exam Vitals reviewed.  Constitutional:      General: She is not in acute distress.    Appearance: She is ill-appearing (chronically). She is not toxic-appearing.  Cardiovascular:     Rate and Rhythm: Normal rate and regular rhythm.     Heart sounds: No murmur heard. Pulmonary:     Effort: Pulmonary effort is normal. No respiratory distress.     Breath sounds: No wheezing, rhonchi or rales.  Abdominal:     Palpations: Abdomen is soft.  Musculoskeletal:     Right lower leg: Edema present.     Left lower leg: No edema.     Comments: RUE edema again noted  Neurological:     Mental Status: She is alert. She is disoriented.  Psychiatric:     Comments: Confused, cannot assess mood or behavior     Data Reviewed: CBG stable Creatinine 3.24 > 2.55 WBC 14.7 > 17.8 Hgb stable at 9.7  Family Communication: son, daughter at bedside  Disposition: Status is: Inpatient Remains inpatient appropriate because: severe infection, AKI     Time spent: 35 minutes  Author: Brendia Sacks, MD 09/14/2022 10:18 AM  For on call review [email or web address].

## 2022-09-14 NOTE — Progress Notes (Addendum)
[name] [name] is a 89 y.o. female admitted on 09/08/2022 with  medical history significant of hypertension, diabetes mellitus type 2, prior stroke, pancreatitis, hyperlipidemia, CKD stage III, and morbid obesity presents to the ER from ALF due to severe weakness and lethargy. Pt with severe gluteal abscess s/p debridement in the OR on 7/16.    On 7/18, fluconazole is being added for esophageal candidiasis     Plan: Daptomycin 600mg  IV q48h Continue Cefepime 2gm IV q24h and metronidazole 500mg  IV q12h Fluconazole 400 mg IV x1 then 200 mg IV daily for a total of 14 day  Follow renal function, cultures,  and clinical course   Height: 5\' 6"  (167.6 cm) Weight: 101.3 kg (223 lb 5.2 oz) IBW/kg (Calculated) : 59.3   Temp (24hrs), Avg:97.9 F (36.6 C), Min:97.4 F (36.3 C), Max:98.3 F (36.8 C)   Last Labs           Recent Labs  Lab 09/08/22 1323 09/08/22 1605 09/09/22 0529 09/10/22 0528 09/11/22 0517 09/12/22 0458 09/13/22 0537  WBC  --   --  16.7* 14.6* 15.5* 13.4* 14.7*  CREATININE  --    < > 2.82* 2.98* 3.38* 3.57* 3.24*  LATICACIDVEN 1.8  --   --   --   --   --   --   VANCORANDOM  --   --   --   --   --   --  11   < > = values in this interval not displayed.      Estimated Creatinine Clearance: 14.7 mL/min (A) (by C-G formula based on SCr of 3.24 mg/dL (H)).         Antimicrobials this admission: 7/12 zosyn x 1 7/13 vanc >>7/17 7/13 cefepime >> 7/17 daptomycin>> 7/13 metronidazole >> 7/18 fluconazole >>    Dose adjustments this admission:     Microbiology results: 7/12 BCx: staph epi in one set 7/14 BCx: no growth 7/12 MRSA PCR: not detected 7/16 abscess culture from OR: NGTD   Adalberto Cole, PharmD, BCPS 09/14/2022 1:57 PM

## 2022-09-14 NOTE — Progress Notes (Signed)
OT Cancellation Note  Patient Details Name: [name] [name] MRN: [id number] DOB: 1932/09/30   Cancelled Treatment:    Reason Eval/Treat Not Completed: Medical issues which prohibited therapy. Spoke with daughter outside of pt's room. Pt unable to participate in OT session this date. Pt is not eating and has been lethargic. Was unable to participate in speech swallow test. Pt has not been able to participate with OT since initial evaluation on 7/13. Pt has either refused or been experiencing medical issues.  Recommend OT signing off at this time until pt is medically able to participate. Please re-order OT when appropriate and a re-evaluation will be completed.   Limmie Patricia, OTR/L,CBIS  Supplemental OT - [facility] Secure Chat Preferred   09/14/2022, 4:24 PM

## 2022-09-14 NOTE — Plan of Care (Signed)
  Problem: Education: Goal: Ability to describe self-care measures that may prevent or decrease complications (Diabetes Survival Skills Education) will improve Outcome: Progressing Goal: Individualized Educational Video(s) Outcome: Progressing   Problem: Fluid Volume: Goal: Ability to maintain a balanced intake and output will improve Outcome: Progressing   Problem: Health Behavior/Discharge Planning: Goal: Ability to identify and utilize available resources and services will improve Outcome: Progressing Goal: Ability to manage health-related needs will improve Outcome: Progressing   Problem: Metabolic: Goal: Ability to maintain appropriate glucose levels will improve Outcome: Progressing   Problem: Nutritional: Goal: Maintenance of adequate nutrition will improve Outcome: Progressing Goal: Progress toward achieving an optimal weight will improve Outcome: Progressing   Problem: Skin Integrity: Goal: Risk for impaired skin integrity will decrease Outcome: Progressing   Problem: Tissue Perfusion: Goal: Adequacy of tissue perfusion will improve Outcome: Progressing   Problem: Education: Goal: Knowledge of General Education information will improve Description: Including pain rating scale, medication(s)/side effects and non-pharmacologic comfort measures Outcome: Progressing   Problem: Health Behavior/Discharge Planning: Goal: Ability to manage health-related needs will improve Outcome: Progressing   Problem: Clinical Measurements: Goal: Diagnostic test results will improve Outcome: Progressing Goal: Cardiovascular complication will be avoided Outcome: Progressing   Problem: Activity: Goal: Risk for activity intolerance will decrease Outcome: Progressing   Problem: Nutrition: Goal: Adequate nutrition will be maintained Outcome: Progressing   Problem: Elimination: Goal: Will not experience complications related to urinary retention Outcome: Progressing    Problem: Pain Managment: Goal: General experience of comfort will improve Outcome: Progressing   Problem: Safety: Goal: Ability to remain free from injury will improve Outcome: Progressing   Problem: Skin Integrity: Goal: Risk for impaired skin integrity will decrease Outcome: Progressing

## 2022-09-14 NOTE — Progress Notes (Signed)
  Daily Progress Note   Patient Name: [name] [name]       Date: 09/14/2022 DOB: 12/10/1932  Age: 89 y.o. MRN#: [id number] Attending Physician: Standley Brooking, MD Primary Care Physician: [name], MD Admit Date: 09/08/2022 Length of Stay: 6 days  Discussed care with primary hospitalist today. Dr. Irene Limbo has already engaged in GOC conversations with family this morning. Noted PMT would not visit at this time as did not want to overwhelm family. Did discuss geriatric recommendations regarding patient's delirium. Noted PMT would continue to follow along with patient's journey. Please reach out if acute needs arise. Thank you.   Alvester Morin, DO Palliative Care Provider PMT # [phone]

## 2022-09-14 NOTE — Progress Notes (Signed)
[facility] Progress Note    Assessment/ Plan:   AKI on CKD3b -followed by Dr. Signe Colt. BL Cr ~1.5-1.7. AKI likely secondary to ATN from sepsis and soft BPs. Potential recovery hindered by the use of lasix in the last 2 days and hyperglycemia. Agree with holding home chlorthalidone, losartan, farxiga, lasix -renal ultrasound without obstruction. UA pending -Cr down to 2.55. Volume status is reasonable today. C/w fluids: LR 75cc/hr x 1 day especially since she is not eating any drinking as much -given age/chronic back wound/functional status would recommend against renal replacement therapy if indicated. Discussed with patient and son at the bedside and she is in agreement (it is her preexisting wish to NOT dialysis if indicated given what she saw her husband go through) Appreciate palliative care's assistance -Avoid nephrotoxic medications including NSAIDs and iodinated intravenous contrast exposure unless the latter is absolutely indicated.  Preferred narcotic agents for pain control are hydromorphone, fentanyl, and methadone. Morphine should not be used. Avoid Baclofen and avoid oral sodium phosphate and magnesium citrate based laxatives / bowel preps. Continue strict Input and Output monitoring. Will monitor the patient closely with you and intervene or adjust therapy as indicated by changes in clinical status/labs    Rt buttock cellulitis MRSA bacteremia -on cefepime, dapt, flagyl. Per primary. Surgery following, s/p debridement 7/16. WBC trending up   COVID19 -supportive care per primary service   Uncontrolled DM2 with hyperglycemia -per primary service   Anemia of chronic disease -transfuse prn for Hgb <7. Avoid IV Fe. Hgb 9.7-stable   HTN -BP acceptable   Hyponatremia -component of pseudohypoNa secondary to hyperglycemia and hypoNa secondary to AKI. Isotonic fluids as above. Resolved   Hypoalbuminemia -push protein  Discussed with RN. Discussed with son at the  bedside.  Anthony Sar, MD [facility]  Subjective:   Not as interactive anymore, concern for aspiration. Now DNR. Seems to be making more urine now. UOP charted ~0.7L.   Objective:   BP (!) 182/59   Pulse 83   Temp 98.4 F (36.9 C) (Oral)   Resp 18   Ht 5\' 6"  (1.676 m)   Wt 101.3 kg   SpO2 98%   BMI 36.05 kg/m   Intake/Output Summary (Last 24 hours) at 09/14/2022 1237 Last data filed at 09/14/2022 1610 Gross per 24 hour  Intake 3688.17 ml  Output 750 ml  Net 2938.17 ml   Weight change:   Physical Exam: Gen: chronically ill appearing CVS: RRR Resp: normal WOB Abd: obese Ext: trace edema b/l Le Neuro: drowsy  Imaging: No results found.  Labs: BMET Recent Labs  Lab 09/08/22 1605 09/09/22 0529 09/10/22 0528 09/11/22 0517 09/12/22 0458 09/13/22 0537 09/14/22 0815  NA 132* 131* 133* 130* 129* 129* 136  K 4.3 4.4 4.8 4.2 4.2 4.9 4.4  CL 97* 96* 98 97* 96* 98 104  CO2 22 21* 19* 20* 21* 18* 21*  GLUCOSE 206* 259* 375* 301* 263* 351* 211*  BUN 50* 55* 68* 96* 111* 121* 110*  CREATININE 2.90* 2.82* 2.98* 3.38* 3.57* 3.24* 2.55*  CALCIUM 7.4* 7.3* 7.7* 7.6* 7.4* 7.6* 7.9*   CBC Recent Labs  Lab 09/10/22 0528 09/11/22 0517 09/12/22 0458 09/13/22 0537 09/14/22 0815  WBC 14.6* 15.5* 13.4* 14.7* 17.8*  NEUTROABS 13.6* 13.8* 11.7* 12.5*  --   HGB 9.5* 9.5* 9.2* 9.6* 9.7*  HCT 30.2* 30.0* 27.6* 30.5* 31.0*  MCV 82.3 81.5 78.9* 81.6 80.9  PLT 261 309 330 348 426*    Medications:  heparin injection (subcutaneous)  5,000 Units Subcutaneous Q8H   hydrALAZINE  100 mg Oral TID   insulin aspart  0-20 Units Subcutaneous TID WC   insulin aspart  0-5 Units Subcutaneous QHS   insulin aspart  20 Units Subcutaneous TID WC   insulin glargine-yfgn  15 Units Subcutaneous BID   ipratropium-albuterol  3 mL Nebulization BID   metoprolol tartrate  5 mg Intravenous Q8H   polycarbophil  625 mg Oral BID      Anthony Sar, MD [facility] 09/14/2022, 12:37 PM

## 2022-09-14 NOTE — TOC Progression Note (Signed)
Transition of Care [facility]) - Progression Note    Patient Details  Name: [name] [name] MRN: [id number] Date of Birth: Aug 21, 1932  Transition of Care [facility]) CM/SW Contact  Otelia Santee, LCSW Phone Number: 09/14/2022, 3:42 PM  Clinical Narrative:    Referrals have been sent out for SNF. Currently awaiting bed offers. Pt will be able to transfer to SNF following isolation period and pending bed offer and insurance authorization.    Expected Discharge Plan: Skilled Nursing Facility Barriers to Discharge: Continued Medical Work up, SNF Covid  Expected Discharge Plan and Services In-house Referral: Clinical Social Work Discharge Planning Services: NA Post Acute Care Choice: Skilled Nursing Facility Living arrangements for the past 2 months: Independent Living Facility                 DME Arranged: N/A DME Agency: NA                   Social Determinants of Health (SDOH) Interventions SDOH Screenings   Food Insecurity: No Food Insecurity (09/08/2022)  Housing: Low Risk  (09/08/2022)  Transportation Needs: No Transportation Needs (09/08/2022)  Utilities: Not At Risk (09/08/2022)  Depression (PHQ2-9): Low Risk  (03/27/2022)  Financial Resource Strain: Low Risk  (06/14/2022)   Received from [facility], [facility]  Physical Activity: Inactive (05/19/2019)   Received from [facility], [facility]  Social Connections: Socially Integrated (12/05/2021)   Received from [facility], [facility]  Stress: No Stress Concern Present (12/05/2021)   Received from [facility], [facility]  Tobacco Use: Low Risk  (09/12/2022)    Readmission Risk Interventions     No data to display

## 2022-09-14 NOTE — IPAL (Signed)
  Interdisciplinary Goals of Care Family Meeting   Date carried out: 09/14/2022  Location of the meeting: Bedside  Member's involved: Physician, Family Member or next of kin, and Other: RR RN  Durable Power of Insurance risk surveyor: son Link Snuffer    Discussion: We discussed goals of care for [name] [name] .  We discussed worsening condition, encephalopathy, acute kidney injury, severe infection.  Family has elected DNR status, DNI, no dialysis.  Code status: CODE STATUS updated to DNR.  No escalation of care warranted at the present time.  Disposition: Continue current acute care  Time spent for the meeting: 15 minutes    Brendia Sacks, MD  09/14/2022, 10:24 AM

## 2022-09-15 DIAGNOSIS — N179 Acute kidney failure, unspecified: Secondary | ICD-10-CM | POA: Diagnosis not present

## 2022-09-15 DIAGNOSIS — U071 COVID-19: Secondary | ICD-10-CM | POA: Diagnosis not present

## 2022-09-15 DIAGNOSIS — R410 Disorientation, unspecified: Secondary | ICD-10-CM | POA: Diagnosis not present

## 2022-09-15 DIAGNOSIS — L0231 Cutaneous abscess of buttock: Secondary | ICD-10-CM | POA: Diagnosis not present

## 2022-09-15 LAB — CBC
HCT: 30.9 % — ABNORMAL LOW (ref 36.0–46.0)
Hemoglobin: 9.7 g/dL — ABNORMAL LOW (ref 12.0–15.0)
MCH: 26 pg (ref 26.0–34.0)
MCHC: 31.4 g/dL (ref 30.0–36.0)
MCV: 82.8 fL (ref 80.0–100.0)
Platelets: 405 10*3/uL — ABNORMAL HIGH (ref 150–400)
RBC: 3.73 MIL/uL — ABNORMAL LOW (ref 3.87–5.11)
RDW: 16 % — ABNORMAL HIGH (ref 11.5–15.5)
WBC: 18.1 10*3/uL — ABNORMAL HIGH (ref 4.0–10.5)
nRBC: 2.4 % — ABNORMAL HIGH (ref 0.0–0.2)

## 2022-09-15 LAB — BASIC METABOLIC PANEL
Anion gap: 10 (ref 5–15)
BUN: 85 mg/dL — ABNORMAL HIGH (ref 8–23)
CO2: 23 mmol/L (ref 22–32)
Calcium: 8 mg/dL — ABNORMAL LOW (ref 8.9–10.3)
Chloride: 106 mmol/L (ref 98–111)
Creatinine, Ser: 1.88 mg/dL — ABNORMAL HIGH (ref 0.44–1.00)
GFR, Estimated: 26 mL/min — ABNORMAL LOW (ref >60)
Glucose, Bld: 237 mg/dL — ABNORMAL HIGH (ref 70–99)
Potassium: 4.6 mmol/L (ref 3.5–5.1)
Sodium: 139 mmol/L (ref 135–145)

## 2022-09-15 LAB — CULTURE, BLOOD (ROUTINE X 2)
Culture: NO GROWTH
Culture: NO GROWTH
Special Requests: ADEQUATE
Special Requests: ADEQUATE

## 2022-09-15 LAB — AEROBIC/ANAEROBIC CULTURE W GRAM STAIN (SURGICAL/DEEP WOUND): Gram Stain: NONE SEEN

## 2022-09-15 LAB — GLUCOSE, CAPILLARY
Glucose-Capillary: 213 mg/dL — ABNORMAL HIGH (ref 70–99)
Glucose-Capillary: 199 mg/dL — ABNORMAL HIGH (ref 70–99)
Glucose-Capillary: 174 mg/dL — ABNORMAL HIGH (ref 70–99)
Glucose-Capillary: 204 mg/dL — ABNORMAL HIGH (ref 70–99)

## 2022-09-15 MED ORDER — HYDROMORPHONE HCL 1 MG/ML IJ SOLN: 0.2500 mg | INTRAMUSCULAR | Status: DC | PRN

## 2022-09-15 MED ORDER — HYDROMORPHONE HCL 1 MG/ML IJ SOLN
0.2500 mg | INTRAMUSCULAR | Status: DC | PRN
  Administered 2022-09-17 – 2022-09-22 (×6): 0.25 mg via INTRAVENOUS
  Filled 2022-09-15 (×7): qty 0.5

## 2022-09-15 MED ORDER — METOPROLOL TARTRATE 5 MG/5ML IV SOLN
10.0000 mg | Freq: Three times a day (TID) | INTRAVENOUS | Status: DC
  Administered 2022-09-15 – 2022-09-18 (×9): 10 mg via INTRAVENOUS
  Filled 2022-09-15 (×9): qty 10

## 2022-09-15 NOTE — Progress Notes (Signed)
[name] NEPHROLOG[facility] PROGRESS NOTE  Assessment/ Plan:  #AKI on CKD3b, followed by Dr. Signe Colt. BL Cr ~1.5-1.7. AKI likely secondary to ATN from sepsis and soft BPs. Potential recovery hindered by the use of lasix and hyperglycemia. Agree with holding home chlorthalidone, losartan, farxiga, lasix -renal ultrasound without obstruction.  -Cr down to 2.55 on 7/18. Pending lab result from today. -She is NPO and vol is ok therfore agrre with fluid to complete 24 hrs till this afternoon.  -given age/chronic back wound/functional status would recommend against renal replacement therapy if indicated. It seems like her preexisting wish is not to do dialysis if indicated given what she saw her husband go through. Appreciate palliative care's assistance. Discussed with her son and daughter today. -Avoid nephrotoxic medications including NSAIDs and iodinated intravenous contrast exposure unless the latter is absolutely indicated.  Continue strict Input and Output monitoring. Will monitor the patient closely with you and intervene or adjust therapy as indicated by changes in clinical status/labs    # Rt buttock cellulitis MRSA bacteremia -on cefepime, dapt, flagyl. Per primary. Surgery following, s/p debridement 7/16.    #COVID19 -supportive care per primary service  # Anemia of chronic disease -transfuse prn for Hgb <7. Avoid IV Fe. Hgb 9.7-stable   HTN -BP acceptable, on hydralazine.   #Hyponatremia -component of pseudohypoNa secondary to hyperglycemia and hypoNa secondary to AKI. Isotonic fluids as above. Resolved   # AMS/acute metabolic encephalopathy: opens eyes but really not waking up this am. On dilaudid, psych meds etc. Recommend to minimize pain meds and sedatives. Noted high BUN, expect to improve.   Subjective:  Seen and examined. Urine output around 700 cc. Opens eye but somnolent. Her son and daughter at the bedside. No other new event.  Objective Vital signs in last 24  hours: Vitals:   09/14/22 1404 09/14/22 1825 09/14/22 1939 09/15/22 0602  BP: (!) 156/51  (!) 153/57 138/61  Pulse: 82  86 88  Resp: 14  20   Temp: 97.6 F (36.4 C)  98.2 F (36.8 C) 98.3 F (36.8 C)  TempSrc: Oral  Oral Axillary  SpO2: 94% 93% 93% 98%  Weight:      Height:       Weight change:   Intake/Output Summary (Last 24 hours) at 09/15/2022 0901 Last data filed at 09/15/2022 0819 Gross per 24 hour  Intake 728.61 ml  Output 1050 ml  Net -321.39 ml       Labs: RENAL PANEL Recent Labs  Lab 09/08/22 1234 09/08/22 1605 09/09/22 0529 09/10/22 0528 09/11/22 0517 09/12/22 0458 09/13/22 0537 09/14/22 0815  NA 130*   < > 131* 133* 130* 129* 129* 136  K 4.4   < > 4.4 4.8 4.2 4.2 4.9 4.4  CL 95*   < > 96* 98 97* 96* 98 104  CO2 21*   < > 21* 19* 20* 21* 18* 21*  GLUCOSE 176*   < > 259* 375* 301* 263* 351* 211*  BUN 51*   < > 55* 68* 96* 111* 121* 110*  CREATININE 3.24*   < > 2.82* 2.98* 3.38* 3.57* 3.24* 2.55*  CALCIUM 7.4*   < > 7.3* 7.7* 7.6* 7.4* 7.6* 7.9*  MG  --   --   --  2.1 2.3 2.5* 2.5*  --   ALBUMIN 2.8*  --  2.4* 2.4* 2.2*  --  2.8*  --    < > = values in this interval not displayed.    Liver Function Tests:  Recent Labs  Lab 09/10/22 0528 09/11/22 0517 09/13/22 0537  AST 30 39 28  ALT 25 31 34  ALKPHOS 81 74 59  BILITOT 0.8 0.5 0.7  PROT 6.1* 5.8* 6.0*  ALBUMIN 2.4* 2.2* 2.8*   No results for input(s): "LIPASE", "AMYLASE" in the last 168 hours. No results for input(s): "AMMONIA" in the last 168 hours. CBC: Recent Labs    09/11/22 0517 09/12/22 0458 09/13/22 0537 09/14/22 0815 09/15/22 0719  HGB 9.5* 9.2* 9.6* 9.7* 9.7*  MCV 81.5 78.9* 81.6 80.9 82.8    Cardiac Enzymes: Recent Labs  Lab 09/12/22 0458 09/14/22 0815  CKTOTAL 219 116   CBG: Recent Labs  Lab 09/14/22 0920 09/14/22 1205 09/14/22 1650 09/14/22 2058 09/15/22 0739  GLUCAP 198* 198* 182* 178* 213*    Iron Studies: No results for input(s): "IRON", "TIBC",  "TRANSFERRIN", "FERRITIN" in the last 72 hours. Studies/Results: No results found.  Medications: Infusions:  ceFEPime (MAXIPIME) IV 2 g (09/14/22 1152)   DAPTOmycin (CUBICIN) 600 mg in sodium chloride 0.9 % IVPB     fluconazole (DIFLUCAN) IV     lactated ringers 75 mL/hr at 09/14/22 1314   metronidazole 500 mg (09/14/22 2130)    Scheduled Medications:  heparin injection (subcutaneous)  5,000 Units Subcutaneous Q8H   hydrALAZINE  100 mg Oral TID   insulin aspart  0-20 Units Subcutaneous TID WC   insulin aspart  0-5 Units Subcutaneous QHS   insulin aspart  20 Units Subcutaneous TID WC   insulin glargine-yfgn  15 Units Subcutaneous BID   ipratropium-albuterol  3 mL Nebulization BID   metoprolol tartrate  5 mg Intravenous Q8H   polycarbophil  625 mg Oral BID    have reviewed scheduled and prn medications.  Physical Exam: General:NAD, comfortable, open eye but somnolent. Heart:RRR, s1s2 nl Lungs:clear b/l, no crackle Abdomen:soft, Non-tender, non-distended Extremities:No edema Neurology: Somnolent   [name] [name] [name] [name] 09/15/2022,9:01 AM  LOS: 7 days

## 2022-09-15 NOTE — Progress Notes (Signed)
Progress Note   Patient: [name] [name] BJY:[id number] DOB: May 10, 1932 DOA: 09/08/2022     7 DOS: the patient was seen and examined on 09/15/2022   Brief hospital course: 89yow from [facility], PMH including stroke w/ RUE/RLE hemparesis, near bedbound, DM type 2, presented with worsening weakness, lethargy and back pain.  Diagnosed with COVID-19 on 09/06/2022 at ALF and started on Paxlovid. Was also started on Bactrim for diagnosis of cellulitis of the back.  On presentation, WBCs was 18.5, creatinine was 3.24.  CT of abdomen and pelvis without contrast showed right sacral wound extending from the skin surface to the outer surface of the gluteal musculature with significant gas collection.   7/12 Admitted for cellulitis, AKI, COVID. She was started on IV fluids and antibiotics.   7/13 General surgery was consulted.  Conservative management recommended. Respiratory status/SOB impaired exam. 7/14 Palliative care consulted for goals of care: full code, full scope at that time.   7/15 Nephrology consulted for AKI. Not a candidate for RRT. General surgery plans operative exploration  7/16 to OR for extensive debridement.  7/17 general surgery "Prognosis poor in this elderly woman with limited mobility. This decubitus ulcer will never heal and will be a chronic wound.  7/18 encephalopathic. Made DNR.  Consultants General surgery 7/13 Nephrology  Palliative medicine 7/14  Procedures 7/16 INCISION AND DRAINAGE ABSCESS WITH EXTENSIVE DEBRIDEMENT   Assessment and Plan: Right posterior gluteal decubitus abscess with wet gangrene and necrosis Sepsis ruled out Status post incision and drainage with extensive debridement 7/16 per general surgery.  Blood culture Staphylococcus epidermidis in 1 set.  Contaminant.  Repeat cultures from 09/10/2022 have been negative so far.   Continue broad-spectrum antibiotics.   Continue management per general surgery.  Will follow cultures and treat several more  days.   Acute kidney injury on chronic kidney disease stage IIIb Acute metabolic acidosis Thought secondary to ATN from infection, soft BPs, Lasix. Treated with albumin. Creatinine baseline 1.4-1.8.  Creatinine 3.04 on presentation.   Renal ultrasound showed no hydronephrosis.   Creatinine 3.57 > 3.24 > 2.55 > 1.88   Dysphagia Acute delirium Acute metabolic encephalopathy Suspect dysphagia secondary to acute delirium secondary to acute infection, perioperative state. N.p.o. for now.  Speech therapy evaluation appreciated, will repeat 7/20.  Continue IV fluids. Remains encephalopathic today.  ABG reassuring.  Suspect encephalopathy is related to infection and uremia.   COVID-19 Diagnosed on 09/06/2022.  Continue isolation.  Given renal failure, Paxlovid held. Continue supportive care. Respiratory status remains stable    Constipation.    Resolving.    Hyponatremia Resolved.   Normocytic anemia Probably secondary to acute illness.  Follow-up as an outpatient.   Essential hypertension Stable.  Continue hydralazine and metoprolol when  able to take PO. Metoprolol scheduled IV.   Diabetes mellitus type 2 with hyperglycemia A1c 9.8.   Stable.  Continue sliding scale insulin, meal coverage, Semglee   Physical deconditioning PT recommending SNF placement.  TOC following   Goals of care DNR    PMH stroke with RUE and RLE hemiparesis   Prognosis remains guarded.     Subjective:  Per family seems to be ok, responds a bit Still confused  Physical Exam: Vitals:   09/14/22 1939 09/15/22 0602 09/15/22 0931 09/15/22 1253  BP: (!) 153/57 138/61  (!) 153/60  Pulse: 86 88  96  Resp: 20     Temp: 98.2 F (36.8 C) 98.3 F (36.8 C)  98.7 F (37.1 C)  TempSrc:  Oral Axillary  Oral  SpO2: 93% 98% 96% 95%  Weight:      Height:       Physical Exam Vitals reviewed.  Constitutional:      General: She is not in acute distress.    Appearance: She is not ill-appearing or  toxic-appearing.  Cardiovascular:     Rate and Rhythm: Normal rate and regular rhythm.     Heart sounds: No murmur heard. Pulmonary:     Effort: Pulmonary effort is normal. No respiratory distress.     Breath sounds: No wheezing, rhonchi or rales.  Psychiatric:     Comments: Cannot assess mood or affect. Confused.     Data Reviewed: UOP 700 BM x1 Creatinine 3.57 > 3.24 > 2.55 > 1.88   Family Communication: family at bedside  Disposition: Status is: Inpatient Remains inpatient appropriate because: infection, AKI, acute metabolic encephalopathy     Time spent: 35 minutes  Author: Brendia Sacks, MD 09/15/2022 4:58 PM  For on call review [email or web address].

## 2022-09-15 NOTE — Progress Notes (Signed)
Progress Note  3 Days Post-Op  Subjective: Patient with increased lethargy and encephalopathy since 7/17. Family at bedside for dressing change.   Objective: Vital signs in last 24 hours: Temp:  [97.6 F (36.4 C)-98.3 F (36.8 C)] 98.3 F (36.8 C) (07/19 0602) Pulse Rate:  [82-88] 88 (07/19 0602) Resp:  [14-20] 20 (07/18 1939) BP: (138-156)/(51-61) 138/61 (07/19 0602) SpO2:  [93 %-98 %] 96 % (07/19 0931) Last BM Date : 09/14/22  Intake/Output from previous day: 07/18 0701 - 07/19 0700 In: 728.6 [I.V.:529.2; IV Piggyback:199.4] Out: 700 [Urine:700] Intake/Output this shift: Total I/O In: -  Out: 350 [Urine:350]  PE: General: WD, obese female who is laying in bed in NAD Lungs: Respiratory effort nonlabored Abd: soft, NT, ND GU: right gluteal wound as pictured below, some increased fibrinous exudate in base but no clinical signs of worsening or uncontrolled infection, skin changes related to pressure and moisture in gluteal cleft    Lab Results:  Recent Labs    09/14/22 0815 09/15/22 0719  WBC 17.8* 18.1*  HGB 9.7* 9.7*  HCT 31.0* 30.9*  PLT 426* 405*   BMET Recent Labs    09/14/22 0815 09/15/22 0719  NA 136 139  K 4.4 4.6  CL 104 106  CO2 21* 23  GLUCOSE 211* 237*  BUN 110* 85*  CREATININE 2.55* 1.88*  CALCIUM 7.9* 8.0*   PT/INR No results for input(s): "LABPROT", "INR" in the last 72 hours. CMP     Component Value Date/Time   NA 139 09/15/2022 0719   K 4.6 09/15/2022 0719   CL 106 09/15/2022 0719   CO2 23 09/15/2022 0719   GLUCOSE 237 (H) 09/15/2022 0719   BUN 85 (H) 09/15/2022 0719   CREATININE 1.88 (H) 09/15/2022 0719   CALCIUM 8.0 (L) 09/15/2022 0719   PROT 6.0 (L) 09/13/2022 0537   ALBUMIN 2.8 (L) 09/13/2022 0537   AST 28 09/13/2022 0537   ALT 34 09/13/2022 0537   ALKPHOS 59 09/13/2022 0537   BILITOT 0.7 09/13/2022 0537   GFRNONAA 26 (L) 09/15/2022 0719   GFRAA 39 (L) 06/11/2019 1436   Lipase     Component Value Date/Time    LIPASE 21 06/11/2019 1436       Studies/Results: No results found.  Anti-infectives: Anti-infectives (From admission, onward)    Start     Dose/Rate Route Frequency Ordered Stop   09/15/22 1400  DAPTOmycin (CUBICIN) 600 mg in sodium chloride 0.9 % IVPB        8 mg/kg  76.1 kg (Adjusted) 124 mL/hr over 30 Minutes Intravenous Every 48 hours 09/13/22 0857     09/15/22 1000  fluconazole (DIFLUCAN) IVPB 200 mg       Placed in "Followed by" Linked Group   200 mg 100 mL/hr over 60 Minutes Intravenous Daily 09/14/22 1354 09/28/22 0959   09/14/22 1445  fluconazole (DIFLUCAN) IVPB 400 mg       Placed in "Followed by" Linked Group   400 mg 100 mL/hr over 120 Minutes Intravenous  Once 09/14/22 1354 09/14/22 1710   09/13/22 1000  DAPTOmycin (CUBICIN) 600 mg in sodium chloride 0.9 % IVPB        8 mg/kg  76.1 kg (Adjusted) 124 mL/hr over 30 Minutes Intravenous Daily 09/13/22 0850 09/13/22 1435   09/12/22 1105  vancomycin variable dose per unstable renal function (pharmacist dosing)  Status:  Discontinued         Does not apply See admin instructions 09/12/22 1105 09/13/22 0850  09/11/22 0000  vancomycin (VANCOREADY) IVPB 750 mg/150 mL  Status:  Discontinued        750 mg 150 mL/hr over 60 Minutes Intravenous Every 48 hours 09/09/22 0010 09/12/22 1104   09/09/22 1200  ceFEPIme (MAXIPIME) 2 g in sodium chloride 0.9 % 100 mL IVPB        2 g 200 mL/hr over 30 Minutes Intravenous Every 24 hours 09/09/22 0010     09/09/22 1000  metroNIDAZOLE (FLAGYL) IVPB 500 mg        500 mg 100 mL/hr over 60 Minutes Intravenous Every 12 hours 09/09/22 0737     09/09/22 0045  vancomycin (VANCOREADY) IVPB 2000 mg/400 mL        2,000 mg 200 mL/hr over 120 Minutes Intravenous  Once 09/08/22 2358 09/09/22 0252   09/08/22 1345  piperacillin-tazobactam (ZOSYN) IVPB 3.375 g        3.375 g 100 mL/hr over 30 Minutes Intravenous  Once 09/08/22 1336 09/08/22 1444        Assessment/Plan Stage III gluteal  pressure wound S/P I&D 7/16 Dr. Michaell Cowing  - BID dressing changes to largest portion of wound, PRN if soiled with BM - pressure redistribution as able - cultures with NGTD - fine to complete 5 days further of abx, but if cxs with no growth after reincubation would recommend discontinuation from a wound standpoint - updated both of her sons at the bedside  - will see again Monday   FEN: HH/CM diet  VTE: SQH ID: cefepime/daptomycin/flagyl   - per primary -  COVID+ - diagnosed 09/06/22 MRSA bacteremia  AKI on CKD stage IIIb Anemia of chronic disease HTN T2DM Physical deconditioning    LOS: 7 days    Juliet Rude, [facility] 09/15/2022, 11:31 AM Please see Amion for pager number during day hours 7:00am-4:30pm

## 2022-09-15 NOTE — Plan of Care (Signed)
  Problem: Health Behavior/Discharge Planning: Goal: Ability to identify and utilize available resources and services will improve Outcome: Progressing Goal: Ability to manage health-related needs will improve Outcome: Progressing   Problem: Skin Integrity: Goal: Risk for impaired skin integrity will decrease Outcome: Progressing   Problem: Coping: Goal: Ability to adjust to condition or change in health will improve Outcome: Not Progressing   Problem: Fluid Volume: Goal: Ability to maintain a balanced intake and output will improve Outcome: Not Progressing   Problem: Metabolic: Goal: Ability to maintain appropriate glucose levels will improve Outcome: Not Progressing   Problem: Nutritional: Goal: Maintenance of adequate nutrition will improve Outcome: Not Progressing Goal: Progress toward achieving an optimal weight will improve Outcome: Not Progressing   Problem: Tissue Perfusion: Goal: Adequacy of tissue perfusion will improve Outcome: Not Progressing   Problem: Clinical Measurements: Goal: Diagnostic test results will improve Outcome: Not Progressing   Problem: Activity: Goal: Risk for activity intolerance will decrease Outcome: Not Progressing

## 2022-09-15 NOTE — TOC Progression Note (Signed)
Transition of Care [facility]) - Progression Note    Patient Details  Name: [name] [name] MRN: [id number] Date of Birth: 1933/02/27  [facility]) CM/SW Contact  Otelia Santee, LCSW Phone Number: 09/15/2022, 12:30 PM  Clinical Narrative:    Left voicemail with pt's son Link Snuffer, to review bed offer. Will await return call.    Expected Discharge Plan: Skilled Nursing Facility Barriers to Discharge: Continued Medical Work up, SNF Covid  Expected Discharge Plan and Services In-house Referral: Clinical Social Work Discharge Planning Services: NA Post Acute Care Choice: Skilled Nursing Facility Living arrangements for the past 2 months: Independent Living Facility                 DME Arranged: N/A DME Agency: NA                   Social Determinants of Health (SDOH) Interventions SDOH Screenings   Food Insecurity: No Food Insecurity (09/08/2022)  Housing: Low Risk  (09/08/2022)  Transportation Needs: No Transportation Needs (09/08/2022)  Utilities: Not At Risk (09/08/2022)  Depression (PHQ2-9): Low Risk  (03/27/2022)  Financial Resource Strain: Low Risk  (06/14/2022)   Received from [facility], [facility]  Physical Activity: Inactive (05/19/2019)   Received from [facility], [facility]  Social Connections: Socially Integrated (12/05/2021)   Received from [facility], [facility]  Stress: No Stress Concern Present (12/05/2021)   Received from [facility], [facility]  Tobacco Use: Low Risk  (09/12/2022)    Readmission Risk Interventions     No data to display

## 2022-09-16 ENCOUNTER — Inpatient Hospital Stay (HOSPITAL_COMMUNITY): Payer: Medicare Other

## 2022-09-16 DIAGNOSIS — R1310 Dysphagia, unspecified: Secondary | ICD-10-CM | POA: Diagnosis not present

## 2022-09-16 DIAGNOSIS — G9341 Metabolic encephalopathy: Secondary | ICD-10-CM

## 2022-09-16 DIAGNOSIS — L0231 Cutaneous abscess of buttock: Secondary | ICD-10-CM | POA: Diagnosis not present

## 2022-09-16 DIAGNOSIS — N179 Acute kidney failure, unspecified: Secondary | ICD-10-CM | POA: Diagnosis not present

## 2022-09-16 DIAGNOSIS — R410 Disorientation, unspecified: Secondary | ICD-10-CM | POA: Diagnosis not present

## 2022-09-16 LAB — GLUCOSE, CAPILLARY
Glucose-Capillary: 261 mg/dL — ABNORMAL HIGH (ref 70–99)
Glucose-Capillary: 194 mg/dL — ABNORMAL HIGH (ref 70–99)
Glucose-Capillary: 97 mg/dL (ref 70–99)
Glucose-Capillary: 108 mg/dL — ABNORMAL HIGH (ref 70–99)
Glucose-Capillary: 137 mg/dL — ABNORMAL HIGH (ref 70–99)

## 2022-09-16 LAB — CBC
HCT: 31.3 % — ABNORMAL LOW (ref 36.0–46.0)
Hemoglobin: 9.6 g/dL — ABNORMAL LOW (ref 12.0–15.0)
MCH: 25.7 pg — ABNORMAL LOW (ref 26.0–34.0)
MCHC: 30.7 g/dL (ref 30.0–36.0)
MCV: 83.9 fL (ref 80.0–100.0)
Platelets: 382 10*3/uL (ref 150–400)
RBC: 3.73 MIL/uL — ABNORMAL LOW (ref 3.87–5.11)
RDW: 16.7 % — ABNORMAL HIGH (ref 11.5–15.5)
WBC: 21.4 10*3/uL — ABNORMAL HIGH (ref 4.0–10.5)
nRBC: 2 % — ABNORMAL HIGH (ref 0.0–0.2)

## 2022-09-16 LAB — AEROBIC/ANAEROBIC CULTURE W GRAM STAIN (SURGICAL/DEEP WOUND): Gram Stain: NONE SEEN

## 2022-09-16 LAB — BASIC METABOLIC PANEL
Anion gap: 10 (ref 5–15)
BUN: 58 mg/dL — ABNORMAL HIGH (ref 8–23)
CO2: 23 mmol/L (ref 22–32)
Calcium: 8.2 mg/dL — ABNORMAL LOW (ref 8.9–10.3)
Chloride: 108 mmol/L (ref 98–111)
Creatinine, Ser: 1.42 mg/dL — ABNORMAL HIGH (ref 0.44–1.00)
GFR, Estimated: 36 mL/min — ABNORMAL LOW (ref >60)
Glucose, Bld: 276 mg/dL — ABNORMAL HIGH (ref 70–99)
Potassium: 4.7 mmol/L (ref 3.5–5.1)
Sodium: 141 mmol/L (ref 135–145)

## 2022-09-16 LAB — MAGNESIUM: Magnesium: 2.2 mg/dL (ref 1.7–2.4)

## 2022-09-16 MED ORDER — OSMOLITE 1.2 CAL PO LIQD
1000.0000 mL | ORAL | Status: DC
  Filled 2022-09-16 (×3): qty 1000

## 2022-09-16 MED ORDER — INSULIN GLARGINE-YFGN 100 UNIT/ML ~~LOC~~ SOLN
20.0000 [IU] | Freq: Two times a day (BID) | SUBCUTANEOUS | Status: DC
  Administered 2022-09-17 – 2022-09-21 (×8): 20 [IU] via SUBCUTANEOUS
  Filled 2022-09-16 (×10): qty 0.2

## 2022-09-16 MED ORDER — SODIUM CHLORIDE 0.9 % IV SOLN
2.0000 g | Freq: Two times a day (BID) | INTRAVENOUS | Status: DC
  Administered 2022-09-16: 2 g via INTRAVENOUS
  Filled 2022-09-16: qty 12.5

## 2022-09-16 MED ORDER — HYDRALAZINE HCL 20 MG/ML IJ SOLN
5.0000 mg | Freq: Four times a day (QID) | INTRAMUSCULAR | Status: DC
  Administered 2022-09-16 – 2022-09-18 (×9): 5 mg via INTRAVENOUS
  Filled 2022-09-16 (×9): qty 1

## 2022-09-16 MED ORDER — PIPERACILLIN-TAZOBACTAM 3.375 G IVPB
3.3750 g | Freq: Three times a day (TID) | INTRAVENOUS | Status: DC
  Administered 2022-09-16 – 2022-09-18 (×5): 3.375 g via INTRAVENOUS
  Filled 2022-09-16 (×6): qty 50

## 2022-09-16 MED ORDER — SODIUM CHLORIDE 0.9 % IV SOLN
8.0000 mg/kg | Freq: Every day | INTRAVENOUS | Status: DC
  Administered 2022-09-16 – 2022-09-17 (×2): 600 mg via INTRAVENOUS
  Filled 2022-09-16 (×3): qty 12

## 2022-09-16 NOTE — Progress Notes (Signed)
   09/15/22 2002  Assess: MEWS Score  Temp 99 F (37.2 C)  BP (!) 157/66  MAP (mmHg) 91  Pulse Rate (!) 108  Resp 18  SpO2 96 %  O2 Device Room Air  Assess: MEWS Score  MEWS Temp 0  MEWS Systolic 0  MEWS Pulse 1  MEWS RR 0  MEWS LOC 1  MEWS Score 2  MEWS Score Color Yellow  Assess: if the MEWS score is Yellow or Red  Were vital signs taken at a resting state? Yes  Focused Assessment No change from prior assessment  Does the patient meet 2 or more of the SIRS criteria? No  MEWS guidelines implemented  Yes, yellow  Treat  MEWS Interventions Considered administering scheduled or prn medications/treatments as ordered  Take Vital Signs  Increase Vital Sign Frequency  Yellow: Q2hr x1, continue Q4hrs until patient remains green for 12hrs  Escalate  MEWS: Escalate Yellow: Discuss with charge nurse and consider notifying provider and/or RRT  Notify: Charge Nurse/RN  Name of Charge Nurse/RN Notified Location manager  Provider Notification  Provider Name/Title A. Virgel Manifold NP  Date Provider Notified 09/15/22  Time Provider Notified 2010  Method of Notification Page  Notification Reason Other (Comment) (yellow mews)  Assess: SIRS CRITERIA  SIRS Temperature  0  SIRS Pulse 1  SIRS Respirations  0  SIRS WBC 0  SIRS Score Sum  1   Yellow MEWS due to HR and LOC. Provider on call advised to continue to monitor for now and continue to avoid sedating medications if possible. Mick Sell RN

## 2022-09-16 NOTE — TOC Progression Note (Signed)
Transition of Care [facility]) - Progression Note    Patient Details  Name: [name] [name] MRN: [id number] Date of Birth: [birth year]  Transition of Care [facility]) CM/SW Contact  Otelia Santee, LCSW Phone Number: 09/16/2022, 3:34 PM  Clinical Narrative:    Spoke with pt's son and DIL via t/c to review bed offer. Pt only has one bed offer for [facility]. Pt family are not wanting to accept as they do not believe the pt would receive quality care. Pt's family have mention [facility]. [facility], and [facility] as alternatives. CSW shared that [facility] declined and [facility] typically only accept pt's from their own facilities. Pt's family are interested in private paying if they are able to have better SNF options.  TOC continuing to seek placement for pt.    Expected Discharge Plan: Skilled Nursing Facility Barriers to Discharge: Continued Medical Work up, SNF Covid  Expected Discharge Plan and Services In-house Referral: Clinical Social Work Discharge Planning Services: NA Post Acute Care Choice: Skilled Nursing Facility Living arrangements for the past 2 months: Independent Living Facility                 DME Arranged: N/A DME Agency: NA                   Social Determinants of Health (SDOH) Interventions SDOH Screenings   Food Insecurity: No Food Insecurity (09/08/2022)  Housing: Low Risk  (09/08/2022)  Transportation Needs: No Transportation Needs (09/08/2022)  Utilities: Not At Risk (09/08/2022)  Depression (PHQ2-9): Low Risk  (03/27/2022)  Financial Resource Strain: Low Risk  (06/14/2022)   Received from [facility], [facility]  Physical Activity: Inactive (05/19/2019)   Received from [facility], [facility]  Social Connections: Socially Integrated (12/05/2021)   Received from [facility], [facility]  Stress: No Stress Concern Present (12/05/2021)   Received from [facility], [facility]  Tobacco Use: Low Risk  (09/12/2022)    Readmission Risk  Interventions     No data to display

## 2022-09-16 NOTE — Progress Notes (Signed)
[facility] NEPHROLOGY PROGRESS NOTE  Assessment/ Plan:  #AKI on CKD3b, followed by Dr. Signe Colt. BL Cr ~1.5-1.7. AKI likely secondary to ATN from sepsis and soft BPs. Potential recovery hindered by the use of lasix and hyperglycemia. Agree with holding home chlorthalidone, losartan, farxiga, lasix -renal ultrasound without obstruction.  -Cr down to 1.42 today which is her baseline.  Off of IV fluid. -Avoid nephrotoxic medications including NSAIDs and iodinated intravenous contrast exposure unless the latter is absolutely indicated.  Continue strict Input and Output monitoring.   # Rt buttock cellulitis MRSA bacteremia -on cefepime, dapt, flagyl. Per primary. Surgery following, s/p debridement 7/16.    #COVID19 -supportive care per primary service  # Anemia of chronic disease -transfuse prn for Hgb <7. Avoid IV Fe. Hgb 9.7-stable   HTN -Continue current antihypertensive, pain management.  Monitor BP.  Avoid hypotensive episode.  #Hyponatremia: Resolved   # AMS/acute metabolic encephalopathy: opens eyes but really not waking up. Recommend to minimize pain meds and sedatives.  BUN level significantly improved to 58 today.  Sign off, please call us back with question.  She will follow-up with Dr. Signe Colt after discharge from the hospital.  I have discussed with the patient's son and daughter today.  Subjective:  Seen and examined. Urine output 2.9 L.  Opens eye but somnolent. Her son and daughter at the bedside. No other new event.  Objective Vital signs in last 24 hours: Vitals:   09/15/22 1751 09/15/22 2002 09/16/22 0449 09/16/22 0817  BP:  (!) 157/66 (!) 178/65   Pulse:  (!) 108    Resp:  18 16   Temp:  99 F (37.2 C) (!) 97.4 F (36.3 C)   TempSrc:  Oral Oral   SpO2: 93% 96% 96% 97%  Weight:      Height:       Weight change:   Intake/Output Summary (Last 24 hours) at 09/16/2022 1022 Last data filed at 09/16/2022 0500 Gross per 24 hour  Intake 0 ml  Output  2600 ml  Net -2600 ml       Labs: RENAL PANEL Recent Labs  Lab 09/10/22 0528 09/11/22 0517 09/12/22 0458 09/13/22 0537 09/14/22 0815 09/15/22 0719 09/16/22 0541  NA 133* 130* 129* 129* 136 139 141  K 4.8 4.2 4.2 4.9 4.4 4.6 4.7  CL 98 97* 96* 98 104 106 108  CO2 19* 20* 21* 18* 21* 23 23  GLUCOSE 375* 301* 263* 351* 211* 237* 276*  BUN 68* 96* 111* 121* 110* 85* 58*  CREATININE 2.98* 3.38* 3.57* 3.24* 2.55* 1.88* 1.42*  CALCIUM 7.7* 7.6* 7.4* 7.6* 7.9* 8.0* 8.2*  MG 2.1 2.3 2.5* 2.5*  --   --  2.2  ALBUMIN 2.4* 2.2*  --  2.8*  --   --   --     Liver Function Tests: Recent Labs  Lab 09/10/22 0528 09/11/22 0517 09/13/22 0537  AST 30 39 28  ALT 25 31 34  ALKPHOS 81 74 59  BILITOT 0.8 0.5 0.7  PROT 6.1* 5.8* 6.0*  ALBUMIN 2.4* 2.2* 2.8*   No results for input(s): "LIPASE", "AMYLASE" in the last 168 hours. No results for input(s): "AMMONIA" in the last 168 hours. CBC: Recent Labs    09/12/22 0458 09/13/22 0537 09/14/22 0815 09/15/22 0719 09/16/22 0541  HGB 9.2* 9.6* 9.7* 9.7* 9.6*  MCV 78.9* 81.6 80.9 82.8 83.9    Cardiac Enzymes: Recent Labs  Lab 09/12/22 0458 09/14/22 0815  CKTOTAL 219 116   CBG:  Recent Labs  Lab 09/15/22 0739 09/15/22 1141 09/15/22 1720 09/15/22 2047 09/16/22 0747  GLUCAP 213* 199* 174* 204* 261*    Iron Studies: No results for input(s): "IRON", "TIBC", "TRANSFERRIN", "FERRITIN" in the last 72 hours. Studies/Results: No results found.  Medications: Infusions:  ceFEPime (MAXIPIME) IV 2 g (09/15/22 1244)   DAPTOmycin (CUBICIN) 600 mg in sodium chloride 0.9 % IVPB 600 mg (09/15/22 1504)   fluconazole (DIFLUCAN) IV 200 mg (09/15/22 1010)   metronidazole 500 mg (09/15/22 2254)    Scheduled Medications:  heparin injection (subcutaneous)  5,000 Units Subcutaneous Q8H   hydrALAZINE  100 mg Oral TID   insulin aspart  0-20 Units Subcutaneous TID WC   insulin aspart  0-5 Units Subcutaneous QHS   insulin aspart  20 Units  Subcutaneous TID WC   insulin glargine-yfgn  15 Units Subcutaneous BID   ipratropium-albuterol  3 mL Nebulization BID   metoprolol tartrate  10 mg Intravenous Q8H   polycarbophil  625 mg Oral BID    have reviewed scheduled and prn medications.  Physical Exam: General:NAD, comfortable, open eye but somnolent. Heart:RRR, s1s2 nl Lungs:clear b/l, no crackle Abdomen:soft, Non-tender, non-distended Extremities:No edema Neurology: Somnolent   Orlene Salmons Prasad Millie Forde 09/16/2022,10:22 AM  LOS: 8 days

## 2022-09-16 NOTE — Progress Notes (Signed)
Speech Language Pathology Treatment: Dysphagia  Patient Details Name: [name] [name] MRN: [id number] DOB: 08-11-1932 Today's Date: 09/16/2022 Time: 4098-1191 SLP Time Calculation (min) (ACUTE ONLY): 8 min  Assessment / Plan / Recommendation Clinical Impression  Visited pt and daughter at bedside. Pt quite lethargic, opens eys to voice and makes some restless movement but does not respond verbally or follow commands. Pt would not allow oral exam to look for improvement with thrush signs. SLP elicited pt response with a wet sponge. Pt did not accept sealed mouth tight and shook head no. Did not respond to offers of water or ice. Recommend ongoing NPO given mentation. MD aware  HPI HPI: 89 y.o. female admitted on 09/08/2022 with  medical history significant of hypertension, diabetes mellitus type 2, prior stroke, pancreatitis, hyperlipidemia, CKD stage III, and morbid obesity presents to the ER from ALF due to severe weakness and lethargy. Pt with severe gluteal abscess s/p debridement in the OR on 7/16.  She began having AMS on 7/17 and has been lethargic and overtly coughing on secretions and with po.  Prior MBS completed in 2021 after CVA  (left thalamic hematoma) revealed no aspiration or penetration, swallow trigger at vallecular space, minimal retention cleared with ddry swallow. BSE ordered.      SLP Plan  Continue with current plan of care      Recommendations for follow up therapy are one component of a multi-disciplinary discharge planning process, led by the attending physician.  Recommendations may be updated based on patient status, additional functional criteria and insurance authorization.    Recommendations  Diet recommendations: NPO                  Oral care QID           Continue with current plan of care     Teddrick Mallari, Riley Nearing  09/16/2022, 1:15 PM

## 2022-09-16 NOTE — Progress Notes (Signed)
Progress Note   Patient: [name] [name] [id number] DOB: 08-20-1932 DOA: 09/08/2022     8 DOS: the patient was seen and examined on 09/16/2022   Brief hospital course: 89yow from [facility], PMH including stroke w/ RUE/RLE hemparesis, near bedbound, DM type 2, presented with worsening weakness, lethargy and back pain.  Diagnosed with COVID-19 on 09/06/2022 at ALF and started on Paxlovid. Was also started on Bactrim for diagnosis of cellulitis of the back.  On presentation, WBCs was 18.5, creatinine was 3.24.  CT of abdomen and pelvis without contrast showed right sacral wound extending from the skin surface to the outer surface of the gluteal musculature with significant gas collection.   7/12 Admitted for cellulitis, AKI, COVID. She was started on IV fluids and antibiotics.   7/13 General surgery was consulted.  Conservative management recommended. Respiratory status/SOB impaired exam. 7/14 Palliative care consulted for goals of care: full code, full scope at that time.   7/15 Nephrology consulted for AKI. Not a candidate for RRT. General surgery plans operative exploration  7/16 to OR for extensive debridement.  7/17 general surgery "Prognosis poor in this elderly woman with limited mobility. This decubitus ulcer will never heal and will be a chronic wound.  7/18 encephalopathic. Made DNR. 7/20 remains encephalopathic. Renal function improved. WBC higher. Prognosis guarded, may not survive this hospitalization.  Consultants General surgery 7/13 Nephrology  Palliative medicine 7/14  Procedures 7/16 INCISION AND DRAINAGE ABSCESS WITH EXTENSIVE DEBRIDEMENT   Assessment and Plan: Right posterior gluteal decubitus abscess with wet gangrene and necrosis Sepsis ruled out Status post incision and drainage with extensive debridement 7/16 per general surgery.  Blood culture Staphylococcus epidermidis in 1 set.  Contaminant.  Repeat cultures from 09/10/2022 no growth, final Wound cultures:  rare Candida albicans, pending Continue broad-spectrum antibiotics.   Continue wound care     Acute kidney injury on chronic kidney disease stage IIIb Acute metabolic acidosis Thought secondary to ATN from infection, soft BPs, Lasix. Treated with albumin. Creatinine baseline 1.4-1.8.  Creatinine 3.04 on presentation.   Renal ultrasound showed no hydronephrosis.   Creatinine 3.57 > 3.24 > 2.55 > 1.88 > 1.42. Now at baseline.   Dysphagia Acute delirium Acute metabolic encephalopathy Suspect dysphagia secondary to acute delirium secondary to acute infection, perioperative state. Remains encephalopathic, etiology unclear.  ABG was reassuring.  Leukocytosis worse.  Discussed with Dr. Michaell Cowing general surgery, does not think related to wound.  Patient has been afebrile with no signs or symptoms of new or worsening infection. Remain n.p.o., patient not alert enough to participate with speech therapy evaluation today. Will need to trial tube feeds as desired by family. Considering medication effect -- doubt analgesia as symptoms seem out of proportion to minimal doses of hydromorphone. Suspect cefepime may be culprit. Discussed w/ pharmacy, will stop and change to Zosyn. Reluctant to change given renal function, but cefepime seems most likely cause at this point and given WBC elevation need to continue abx. Will check CT head but doubt stroke.   COVID-19 Diagnosed on 09/06/2022.  Continue isolation.  Given renal failure, Paxlovid held. Continue supportive care. Respiratory status remains stable    Constipation.    Continue bowel regimen.  No food intake over the last 48 hours.   Hyponatremia Resolved.   Normocytic anemia Probably secondary to acute illness.  Hgb stable Follow-up as an outpatient.   Essential hypertension Labile, elevated today.  Oral hydralazine and metoprolol on hold.   Continue metoprolol scheduled IV. Start hydralazine  scheduled IV   Diabetes mellitus type 2 with  hyperglycemia A1c 9.8.   CBG a bit high.  Continue sliding scale insulin, meal coverage, increase Semglee   Physical deconditioning PT recommending SNF placement.  TOC following   Goals of care DNR    PMH stroke with RUE and RLE hemiparesis  Prognosis remains guarded.  Discussed with daughter at bedside.  Patient may not survive this hospitalization.  Will check chest x-ray and adjust antibiotics.  Continue to follow closely.    Subjective:  Patient does not offer any history  Per daughter at bedside: patient opens eyes, seems to recognize her, but not really talking to her. Still confused and lethargic.  Physical Exam: Vitals:   09/15/22 2002 09/16/22 0449 09/16/22 0817 09/16/22 1214  BP: (!) 157/66 (!) 178/65  (!) 196/79  Pulse: (!) 108   (!) 102  Resp: 18 16  20   Temp: 99 F (37.2 C) (!) 97.4 F (36.3 C)  98.1 F (36.7 C)  TempSrc: Oral Oral    SpO2: 96% 96% 97% 94%  Weight:      Height:       Physical Exam Vitals reviewed.  Constitutional:      General: She is not in acute distress.    Appearance: She is ill-appearing. She is not toxic-appearing.     Comments: Eyes open, looks at examiner, moves left hand a bit, tries to respond verbally, but not intelligible.   Cardiovascular:     Rate and Rhythm: Normal rate and regular rhythm.     Heart sounds: No murmur heard. Pulmonary:     Effort: Pulmonary effort is normal. No respiratory distress.     Breath sounds: No wheezing, rhonchi or rales.  Musculoskeletal:     Right lower leg: Edema present.     Comments: RUE edema  Neurological:     Comments: RUE, RLE weakness at baseline  Psychiatric:     Comments: Confused, moves left had a little bit to voice, does not answer questions    Data Reviewed: Hypertensive, tachycardic at times UOP 2950 CBG stable BUN 85 > 58 Creatinine 1.88 > 1.42 WBC 18.1 > 21.4  Family Communication: daughter at bedside  Disposition: Status is: Inpatient Remains inpatient  appropriate because: remains severely ill     Time spent: 35 minutes  Author: Brendia Sacks, MD 09/16/2022 1:19 PM  For on call review [email or web address].

## 2022-09-17 DIAGNOSIS — L0231 Cutaneous abscess of buttock: Secondary | ICD-10-CM | POA: Diagnosis not present

## 2022-09-17 DIAGNOSIS — G929 Unspecified toxic encephalopathy: Secondary | ICD-10-CM | POA: Diagnosis not present

## 2022-09-17 DIAGNOSIS — N179 Acute kidney failure, unspecified: Secondary | ICD-10-CM | POA: Diagnosis not present

## 2022-09-17 DIAGNOSIS — U071 COVID-19: Secondary | ICD-10-CM | POA: Diagnosis not present

## 2022-09-17 LAB — GLUCOSE, CAPILLARY
Glucose-Capillary: 167 mg/dL — ABNORMAL HIGH (ref 70–99)
Glucose-Capillary: 186 mg/dL — ABNORMAL HIGH (ref 70–99)
Glucose-Capillary: 208 mg/dL — ABNORMAL HIGH (ref 70–99)
Glucose-Capillary: 224 mg/dL — ABNORMAL HIGH (ref 70–99)
Glucose-Capillary: 248 mg/dL — ABNORMAL HIGH (ref 70–99)
Glucose-Capillary: 249 mg/dL — ABNORMAL HIGH (ref 70–99)

## 2022-09-17 LAB — CBC
HCT: 30 % — ABNORMAL LOW (ref 36.0–46.0)
Hemoglobin: 9.2 g/dL — ABNORMAL LOW (ref 12.0–15.0)
MCH: 26 pg (ref 26.0–34.0)
MCHC: 30.7 g/dL (ref 30.0–36.0)
MCV: 84.7 fL (ref 80.0–100.0)
Platelets: 359 10*3/uL (ref 150–400)
RBC: 3.54 MIL/uL — ABNORMAL LOW (ref 3.87–5.11)
RDW: 17.3 % — ABNORMAL HIGH (ref 11.5–15.5)
WBC: 17.8 10*3/uL — ABNORMAL HIGH (ref 4.0–10.5)
nRBC: 1 % — ABNORMAL HIGH (ref 0.0–0.2)

## 2022-09-17 LAB — BASIC METABOLIC PANEL
Anion gap: 14 (ref 5–15)
BUN: 42 mg/dL — ABNORMAL HIGH (ref 8–23)
CO2: 23 mmol/L (ref 22–32)
Calcium: 8.3 mg/dL — ABNORMAL LOW (ref 8.9–10.3)
Chloride: 111 mmol/L (ref 98–111)
Creatinine, Ser: 1.34 mg/dL — ABNORMAL HIGH (ref 0.44–1.00)
GFR, Estimated: 38 mL/min — ABNORMAL LOW (ref >60)
Glucose, Bld: 252 mg/dL — ABNORMAL HIGH (ref 70–99)
Potassium: 4.4 mmol/L (ref 3.5–5.1)
Sodium: 148 mmol/L — ABNORMAL HIGH (ref 135–145)

## 2022-09-17 MED ORDER — LACTATED RINGERS IV SOLN: INTRAVENOUS | Status: DC

## 2022-09-17 MED ORDER — SALINE SPRAY 0.65 % NA SOLN
1.0000 | NASAL | Status: DC | PRN
  Filled 2022-09-17: qty 44

## 2022-09-17 NOTE — Progress Notes (Signed)
   09/17/22 0412  Assess: MEWS Score  Temp 97.9 F (36.6 C)  BP (!) 149/61  MAP (mmHg) 86  Pulse Rate 95  Resp 20  Level of Consciousness Responds to Pain  SpO2 91 %  O2 Device Room Air  Assess: MEWS Score  MEWS Temp 0  MEWS Systolic 0  MEWS Pulse 0  MEWS RR 0  MEWS LOC 2  MEWS Score 2  MEWS Score Color Yellow  Assess: if the MEWS score is Yellow or Red  Were vital signs taken at a resting state? Yes  Focused Assessment No change from prior assessment  Does the patient meet 2 or more of the SIRS criteria? No  MEWS guidelines implemented  Yes, yellow  Treat  MEWS Interventions Considered administering scheduled or prn medications/treatments as ordered  Take Vital Signs  Increase Vital Sign Frequency  Yellow: Q2hr x1, continue Q4hrs until patient remains green for 12hrs  Escalate  MEWS: Escalate Yellow: Discuss with charge nurse and consider notifying provider and/or RRT  Notify: Charge Nurse/RN  Name of Charge Nurse/RN Notified Vera RN  Assess: SIRS CRITERIA  SIRS Temperature  0  SIRS Pulse 1  SIRS Respirations  0  SIRS WBC 0  SIRS Score Sum  1

## 2022-09-17 NOTE — Progress Notes (Signed)
Progress Note   Patient: [name] [name] UJW:[id number] DOB: Apr 21, 1932 DOA: 09/08/2022     9 DOS: the patient was seen and examined on 09/17/2022   Brief hospital course: 89yow from [facility], PMH including stroke w/ RUE/RLE hemparesis, near bedbound, DM type 2, presented with worsening weakness, lethargy and back pain.  Diagnosed with COVID-19 on 09/06/2022 at ALF and started on Paxlovid. Was also started on Bactrim for diagnosis of cellulitis of the back.  On presentation, WBCs was 18.5, creatinine was 3.24.  CT of abdomen and pelvis without contrast showed right sacral wound extending from the skin surface to the outer surface of the gluteal musculature with significant gas collection.   7/12 Admitted for cellulitis, AKI, COVID. She was started on IV fluids and antibiotics.   7/13 General surgery was consulted.  Conservative management recommended. Respiratory status/SOB impaired exam. 7/14 Palliative care consulted for goals of care: full code, full scope at that time.   7/15 Nephrology consulted for AKI. Not a candidate for RRT. General surgery plans operative exploration  7/16 to OR for extensive debridement.  7/17 general surgery "Prognosis poor in this elderly woman with limited mobility. This decubitus ulcer will never heal and will be a chronic wound.  7/18 encephalopathic. Made DNR. 7/20 remains encephalopathic. Renal function improved. WBC higher. Prognosis guarded, may not survive this hospitalization.  Consultants General surgery 7/13 Nephrology  Palliative medicine 7/14  Procedures 7/16 INCISION AND DRAINAGE ABSCESS WITH EXTENSIVE DEBRIDEMENT   Assessment and Plan: Right posterior gluteal decubitus abscess with wet gangrene and necrosis Sepsis ruled out Status post incision and drainage with extensive debridement 7/16 per general surgery.  Blood culture Staphylococcus epidermidis in 1 set.  Contaminant.  Repeat blood cultures from 09/10/2022 no growth, final Wound  cultures: rare Candida, bacteroides ovatus, fragilis; beta lactamase positive Changed abx to Zosyn 7/20. Continue fluconazole. Continue wound care     Acute kidney injury on chronic kidney disease stage IIIb Acute metabolic acidosis Creatinine baseline 1.4-1.8.  Creatinine 3.04 on presentation.  Secondary to ATN from infection, soft BPs, Lasix. Treated with albumin. Renal ultrasound showed no hydronephrosis.   Creatinine 1.42 >1.34. Has recovered to baseline   Dysphagia Acute delirium Acute toxic encephalopathy Suspect dysphagia secondary to acute delirium secondary to acute infection, perioperative state. Mentation much better today, awake, alert, follows commands and responds appropriately.  Suspect encephalopathy secondary to cefepime. CT head 7/20 negative Continue supportive care.   COVID-19 -- resolved Diagnosed on 09/06/2022.  Continue isolation.  Given renal failure, Paxlovid held. Continue supportive care. Respiratory status remains stable    Constipation.    Continue bowel regimen.  No food intake over the last 48 hours.   Hyponatremia -- resolved   Normocytic anemia Probably secondary to acute illness.  Hgb stable Follow-up as an outpatient.   Essential hypertension Labile, but better today.  Oral hydralazine and metoprolol on hold.   Continue metoprolol and hydralazine scheduled IV.   Diabetes mellitus type 2 with hyperglycemia A1c 9.8.   CBG better today.  Continue sliding scale insulin, meal coverage, Semglee   Physical deconditioning PT recommending SNF placement.  TOC following   Goals of care DNR    PMH stroke with RUE and RLE hemiparesis   Remains afebrile, WBC 21.4 > 17.8, CT head negative, hypertensive, renal function continues to improve, CBG better controlled.  Better today.  Can hold off on tube feeds as I think her mentation will allow for feeding trials within the next 24 hours.  Continue IV hydration.    Subjective:  Better today "I feel  better" Daughter and granddaughter at bedside Patient now more awake and talkative, following commands  Physical Exam: Vitals:   09/16/22 2013 09/17/22 0412 09/17/22 0500 09/17/22 0820  BP: (!) 188/78 (!) 149/61  (!) 189/71  Pulse: 96 95  92  Resp: 18 20  18   Temp: 98.9 F (37.2 C) 97.9 F (36.6 C)  100 F (37.8 C)  TempSrc:      SpO2: 90% 91%  94%  Weight:   135.2 kg   Height:       Physical Exam Vitals reviewed.  Constitutional:      General: She is not in acute distress.    Appearance: She is not ill-appearing or toxic-appearing.  Cardiovascular:     Rate and Rhythm: Normal rate and regular rhythm.     Heart sounds: No murmur heard. Pulmonary:     Effort: Pulmonary effort is normal. No respiratory distress.     Breath sounds: No wheezing, rhonchi or rales.  Musculoskeletal:     Right lower leg: Edema present.     Comments: Moves left arm and leg to command  Neurological:     Mental Status: She is alert.  Psychiatric:        Mood and Affect: Mood normal.        Behavior: Behavior normal.     Comments: Speech slow and slurred, but follows commands and responds appropriately   Data Reviewed: Creatinine better WBC decreased Hgb stable Wound culture: rare Candida, bacteroides ovatus, fragilis; beta lactamase positive CT head no acute changes CXR vascular congestion  Family Communication: daughter and granddaughter at bedside, son Link Snuffer by telephone  Disposition: Status is: Inpatient Remains inpatient appropriate because: slow recovery from severe infection. Acute toxic encephalopathy.     Time spent: 20 minutes  Author: Brendia Sacks, MD 09/17/2022 1:33 PM  For on call review [email or web address].

## 2022-09-17 NOTE — Progress Notes (Signed)
Speech Language Pathology Treatment: Dysphagia  Patient Details Name: TIYAH ZELENAK MRN: [id number] DOB: Jan 21, 1933 Today's Date: 09/17/2022 Time: 0102-7253 SLP Time Calculation (min) (ACUTE ONLY): 13 min  Assessment / Plan / Recommendation Clinical Impression  Pt more alert and responsive today, but fatigued during session; had lots of family visiting. Pt needs heavy verbal dn tactile cues to be aware of PO trials, seal lips, initiate swallow, cough on command etc. There were instances of anterior spillage, prolonged mastication of puree and coughing with trials of thin liquids. Pts tongue is much improved, last remnants of candida and dry secretions slough off with oral care. Pt noted to continue to mouth breathe and left nare occluded. Suggest humidified O2 or medical air to open nasal passage and reduce mouth breathing and open mouth posture during oral intake. Pt may continue ice and puree when alert, but avoid water via teaspoon or otherwise since she is coughing on that. May need instrumental testing if signs of aspiration persist. Will f/u for further diet advancement when appropriate.    HPI HPI: 89 y.o. female admitted on 09/08/2022 with  medical history significant of hypertension, diabetes mellitus type 2, prior stroke, pancreatitis, hyperlipidemia, CKD stage III, and morbid obesity presents to the ER from ALF due to severe weakness and lethargy. Pt with severe gluteal abscess s/p debridement in the OR on 7/16.  She began having AMS on 7/17 and has been lethargic and overtly coughing on secretions and with po.  Prior MBS completed in 2021 after CVA  (left thalamic hematoma) revealed no aspiration or penetration, swallow trigger at vallecular space, minimal retention cleared with ddry swallow. BSE ordered.      SLP Plan  Continue with current plan of care      Recommendations for follow up therapy are one component of a multi-disciplinary discharge planning process, led by the  attending physician.  Recommendations may be updated based on patient status, additional functional criteria and insurance authorization.    Recommendations  Diet recommendations: Other(comment);NPO (ice chips and puree)                  Oral care QID           Continue with current plan of care     Ilma Achee, Riley Nearing  09/17/2022, 2:54 PM

## 2022-09-17 NOTE — Progress Notes (Signed)
Pharmacy Antibiotic Note  [name] [name] is a 89 y.o. female admitted on 09/08/2022 with  medical history significant of hypertension, diabetes mellitus type 2, prior stroke, pancreatitis, hyperlipidemia, CKD stage III, and morbid obesity presents to the ER from ALF due to severe weakness and lethargy. Pt with severe gluteal abscess s/p debridement in the OR on 7/16. SCr elevated, but has slowly been improving.  7/20 Pt with ongoing encephalopathy. D/w MD, concern related to medication - ? cefepime.  Plan: -Increase daptomycin frequency to q24h (600 mg) -Change cefepime/flagyl to Zosyn 3.375 g IV q8h extended infusion -Continue fluconazole 200 mg IV q24h -Continue to follow renal function, cultures and clinical course for dose adjustments and de-escalation as indicated  Height: 5\' 6"  (167.6 cm) Weight: 135.2 kg (298 lb 1 oz) IBW/kg (Calculated) : 59.3  Temp (24hrs), Avg:98.6 F (37 C), Min:97.9 F (36.6 C), Max:100 F (37.8 C)  Recent Labs  Lab 09/13/22 0537 09/14/22 0815 09/15/22 0719 09/16/22 0541 09/17/22 0604  WBC 14.7* 17.8* 18.1* 21.4* 17.8*  CREATININE 3.24* 2.55* 1.88* 1.42* 1.34*  VANCORANDOM 11  --   --   --   --     Estimated Creatinine Clearance: 41.9 mL/min (A) (by C-G formula based on SCr of 1.34 mg/dL (H)).      Antimicrobials this admission: 7/12 zosyn x 1 7/13 vanc >>7/17 7/13 cefepime >> 7/20 7/13 metronidazole >> 7/20 7/17 daptomycin >> 7/20 zosyn >>  Microbiology results: 7/12 BCx: staph epi in one set 7/14 BCx: no growth 7/12 MRSA PCR: not detected 7/16 abscess culture from OR: candida albicans, bacteroides ovatus/xylanisolvens and fragilis  Thank you for allowing pharmacy to be a part of this patient's care.  Pricilla Riffle, PharmD, BCPS Clinical Pharmacist 09/17/2022 10:06 AM

## 2022-09-17 NOTE — Plan of Care (Signed)
  Problem: Elimination: Goal: Will not experience complications related to urinary retention Outcome: Progressing   Problem: Pain Managment: Goal: General experience of comfort will improve Outcome: Progressing   Problem: Safety: Goal: Ability to remain free from injury will improve Outcome: Progressing   Problem: Coping: Goal: Level of anxiety will decrease Outcome: Not Progressing   Problem: Skin Integrity: Goal: Risk for impaired skin integrity will decrease Outcome: Not Progressing

## 2022-09-17 NOTE — Progress Notes (Signed)
Initial Nutrition Assessment  DOCUMENTATION CODES:   Morbid obesity  INTERVENTION:  - Advance diet as tolerated.   NUTRITION DIAGNOSIS:   Inadequate oral intake related to inability to eat as evidenced by NPO status.  GOAL:   Patient will meet greater than or equal to 90% of their needs  MONITOR:   Diet advancement  REASON FOR ASSESSMENT:   Consult Assessment of nutrition requirement/status, Enteral/tube feeding initiation and management  ASSESSMENT:   89 y.o. female admits related to weakness and back pain. PMH includes: DM, HTN, pancreatitis. Pt is currently receiving medical management related to right posterior gluteal decubitus abscess with wet gangrene and necrosis.  Meds reviewed:  sliding scale insulin, semglee, fibercon. Labs reviewed: Na high, BUN/Creatinine elevated. IVF: LR @ 75 mL/hr.   Per EMR, unable to assess orientation level. Pt also with expressive aphasia. MD consult to initiate EN. No feeding tube in place. Pt has been NPO x4 days. Spoke with RN, who reports that the pt's family wants to give her some time prior to placing feeding tube. TF orders in chart. RD will discontinue as patient has no access. Pt has been in the hospital, per EMR pt was on a diet for the first 4 days of admission and was eating 50-100% of her meals. No significant wt loss per record. RD will continue to monitor for diet advancement. If unable to advance diet within 24-48 hrs, consider NG tube placement.   NUTRITION - FOCUSED PHYSICAL EXAM:  Remote assessment.   Diet Order:   Diet Order             Diet NPO time specified  Diet effective now                   EDUCATION NEEDS:   Not appropriate for education at this time  Skin:  Skin Assessment: Skin Integrity Issues: Skin Integrity Issues:: Unstageable Unstageable: R buttocks  Last BM:  7/18  Height:   Ht Readings from Last 1 Encounters:  09/12/22 5\' 6"  (1.676 m)    Weight:   Wt Readings from Last 1  Encounters:  09/17/22 135.2 kg    Ideal Body Weight:     BMI:  Body mass index is 48.11 kg/m.  Estimated Nutritional Needs:   Kcal:  2200-2500 kcals  Protein:  115-145 gm  Fluid:  >/= 2.2 L  Bethann Humble, RD, LDN, CNSC.

## 2022-09-18 ENCOUNTER — Encounter: Payer: Medicare Other | Admitting: Physical Medicine and Rehabilitation

## 2022-09-18 DIAGNOSIS — L0231 Cutaneous abscess of buttock: Secondary | ICD-10-CM | POA: Diagnosis not present

## 2022-09-18 DIAGNOSIS — N179 Acute kidney failure, unspecified: Secondary | ICD-10-CM | POA: Diagnosis not present

## 2022-09-18 DIAGNOSIS — U071 COVID-19: Secondary | ICD-10-CM | POA: Diagnosis not present

## 2022-09-18 DIAGNOSIS — G929 Unspecified toxic encephalopathy: Secondary | ICD-10-CM | POA: Diagnosis not present

## 2022-09-18 LAB — GLUCOSE, CAPILLARY
Glucose-Capillary: 185 mg/dL — ABNORMAL HIGH (ref 70–99)
Glucose-Capillary: 147 mg/dL — ABNORMAL HIGH (ref 70–99)
Glucose-Capillary: 77 mg/dL (ref 70–99)
Glucose-Capillary: 132 mg/dL — ABNORMAL HIGH (ref 70–99)

## 2022-09-18 LAB — CBC
HCT: 28.3 % — ABNORMAL LOW (ref 36.0–46.0)
Hemoglobin: 8.8 g/dL — ABNORMAL LOW (ref 12.0–15.0)
MCH: 26 pg (ref 26.0–34.0)
MCHC: 31.1 g/dL (ref 30.0–36.0)
MCV: 83.5 fL (ref 80.0–100.0)
Platelets: 377 10*3/uL (ref 150–400)
RBC: 3.39 MIL/uL — ABNORMAL LOW (ref 3.87–5.11)
RDW: 18 % — ABNORMAL HIGH (ref 11.5–15.5)
WBC: 17.5 10*3/uL — ABNORMAL HIGH (ref 4.0–10.5)
nRBC: 0.3 % — ABNORMAL HIGH (ref 0.0–0.2)

## 2022-09-18 LAB — BASIC METABOLIC PANEL
Anion gap: 9 (ref 5–15)
BUN: 33 mg/dL — ABNORMAL HIGH (ref 8–23)
CO2: 28 mmol/L (ref 22–32)
Calcium: 8.1 mg/dL — ABNORMAL LOW (ref 8.9–10.3)
Chloride: 110 mmol/L (ref 98–111)
Creatinine, Ser: 1.22 mg/dL — ABNORMAL HIGH (ref 0.44–1.00)
GFR, Estimated: 43 mL/min — ABNORMAL LOW (ref >60)
Glucose, Bld: 190 mg/dL — ABNORMAL HIGH (ref 70–99)
Potassium: 3.5 mmol/L (ref 3.5–5.1)
Sodium: 147 mmol/L — ABNORMAL HIGH (ref 135–145)

## 2022-09-18 MED ORDER — LIDOCAINE 5 % EX PTCH
1.0000 | MEDICATED_PATCH | Freq: Every day | CUTANEOUS | Status: DC
  Administered 2022-09-18 – 2022-09-22 (×5): 1 via TRANSDERMAL
  Filled 2022-09-18 (×5): qty 1

## 2022-09-18 MED ORDER — QUETIAPINE FUMARATE 25 MG PO TABS
12.5000 mg | ORAL_TABLET | Freq: Every evening | ORAL | Status: DC | PRN
  Administered 2022-09-19 – 2022-09-21 (×3): 12.5 mg via ORAL
  Filled 2022-09-18 (×3): qty 1

## 2022-09-18 MED ORDER — SODIUM CHLORIDE 0.9 % IV SOLN
3.0000 g | Freq: Four times a day (QID) | INTRAVENOUS | Status: DC
  Administered 2022-09-18 – 2022-09-20 (×9): 3 g via INTRAVENOUS
  Filled 2022-09-18 (×9): qty 8

## 2022-09-18 MED ORDER — HYDRALAZINE HCL 50 MG PO TABS
100.0000 mg | ORAL_TABLET | Freq: Three times a day (TID) | ORAL | Status: DC
  Administered 2022-09-18 – 2022-09-22 (×11): 100 mg via ORAL
  Filled 2022-09-18 (×11): qty 2

## 2022-09-18 MED ORDER — AMANTADINE HCL 100 MG PO CAPS
100.0000 mg | ORAL_CAPSULE | Freq: Every day | ORAL | Status: DC
  Administered 2022-09-18 – 2022-09-22 (×5): 100 mg via ORAL
  Filled 2022-09-18 (×5): qty 1

## 2022-09-18 MED ORDER — METOPROLOL TARTRATE 50 MG PO TABS
100.0000 mg | ORAL_TABLET | Freq: Two times a day (BID) | ORAL | Status: DC
  Administered 2022-09-18 – 2022-09-22 (×9): 100 mg via ORAL
  Filled 2022-09-18 (×8): qty 2

## 2022-09-18 MED ORDER — COLLAGENASE 250 UNIT/GM EX OINT
TOPICAL_OINTMENT | Freq: Every day | CUTANEOUS | Status: DC
  Administered 2022-09-19: 1 via TOPICAL
  Filled 2022-09-18: qty 30

## 2022-09-18 MED ORDER — GLYCERIN (LAXATIVE) 2 G RE SUPP
1.0000 | Freq: Once | RECTAL | Status: AC
  Administered 2022-09-18: 1 via RECTAL
  Filled 2022-09-18: qty 1

## 2022-09-18 MED ORDER — DEXTROSE 5 % IV SOLN: INTRAVENOUS | Status: AC

## 2022-09-18 MED ORDER — HYDRALAZINE HCL 20 MG/ML IJ SOLN
5.0000 mg | Freq: Four times a day (QID) | INTRAMUSCULAR | Status: DC | PRN
  Administered 2022-09-21 (×2): 5 mg via INTRAVENOUS
  Filled 2022-09-18 (×2): qty 1

## 2022-09-18 NOTE — Progress Notes (Signed)
Patient son called complaining the patient was in pain. Patient assessed and c/o pain all over and moaning d/t discomfort. Patient was given prescribed prn medication and son then became agitated and reports the patient may have just need to get on the bedpan d/t complaints of abdominal pain now. Son advised that pain is subjective and the patient reported she was hurting all over and that is why I medicated her. Son became very belligerent and came out of the room yelling and screaming in my face with verbal insults. Charge Nurse (Rupinder) made aware and care transferred to her.

## 2022-09-18 NOTE — Progress Notes (Signed)
  Daily Progress Note   Patient Name: [name] [name]       Date: 09/18/2022 DOB: 1932/07/31  Age: 89 y.o. MRN#: [id number] Attending Physician: Standley Brooking, MD Primary Care Physician: [name], MD Admit Date: 09/08/2022 Length of Stay: 10 days  Discussed care with primary hospitalist today. Hospitalist has been engaging family to assist with complex medical decision making. Currently patient is DNR, continue appropraite medical care. Plan is for patient to go to rehab once medically stable. Hospitalist will reach out if acute palliative medicine needs develop. Thank you.   Alvester Morin, DO Palliative Care Provider PMT # [phone]

## 2022-09-18 NOTE — Progress Notes (Signed)
Progress Note  6 Days Post-Op  Subjective: Pt more alert and getting cleared for diet today. Daughter at bedside.   Objective: Vital signs in last 24 hours: Temp:  [98 F (36.7 C)-98.4 F (36.9 C)] 98.2 F (36.8 C) (07/22 0623) Pulse Rate:  [87-96] 95 (07/22 0623) Resp:  [17-20] 20 (07/22 0623) BP: (161-176)/(61-80) 176/77 (07/22 0623) SpO2:  [94 %-95 %] 95 % (07/22 0623) Weight:  [409 kg] 136 kg (07/22 0525) Last BM Date : 09/14/22  Intake/Output from previous day: 07/21 0701 - 07/22 0700 In: 988.3 [I.V.:626.3; IV Piggyback:362] Out: 1050 [Urine:1050] Intake/Output this shift: No intake/output data recorded.  PE: General: WD, obese female who is laying in bed in NAD Lungs: Respiratory effort nonlabored Abd: soft, NT, ND GU: right gluteal wound with some increased fibrinous exudate in base but no clinical signs of worsening or uncontrolled infection, skin changes related to pressure and moisture in gluteal cleft, counter incisions without cellulitis or drainage    Lab Results:  Recent Labs    09/17/22 0604 09/18/22 0459  WBC 17.8* 17.5*  HGB 9.2* 8.8*  HCT 30.0* 28.3*  PLT 359 377   BMET Recent Labs    09/17/22 0604 09/18/22 0459  NA 148* 147*  K 4.4 3.5  CL 111 110  CO2 23 28  GLUCOSE 252* 190*  BUN 42* 33*  CREATININE 1.34* 1.22*  CALCIUM 8.3* 8.1*   PT/INR No results for input(s): "LABPROT", "INR" in the last 72 hours. CMP     Component Value Date/Time   NA 147 (H) 09/18/2022 0459   K 3.5 09/18/2022 0459   CL 110 09/18/2022 0459   CO2 28 09/18/2022 0459   GLUCOSE 190 (H) 09/18/2022 0459   BUN 33 (H) 09/18/2022 0459   CREATININE 1.22 (H) 09/18/2022 0459   CALCIUM 8.1 (L) 09/18/2022 0459   PROT 6.0 (L) 09/13/2022 0537   ALBUMIN 2.8 (L) 09/13/2022 0537   AST 28 09/13/2022 0537   ALT 34 09/13/2022 0537   ALKPHOS 59 09/13/2022 0537   BILITOT 0.7 09/13/2022 0537   GFRNONAA 43 (L) 09/18/2022 0459   GFRAA 39 (L) 06/11/2019 1436   Lipase      Component Value Date/Time   LIPASE 21 06/11/2019 1436       Studies/Results: CT HEAD WO CONTRAST ( )  Result Date: 09/16/2022 CLINICAL DATA:  Acute encephalopathy.  COVID positive. EXAM: CT HEAD WITHOUT CONTRAST TECHNIQUE: Contiguous axial images were obtained from the base of the skull through the vertex without intravenous contrast. RADIATION DOSE REDUCTION: This exam was performed according to the departmental dose-optimization program which includes automated exposure control, adjustment of the mA and/or kV according to patient size and/or use of iterative reconstruction technique. COMPARISON:  Head CT 04/08/2020.  MRI head 01/05/2020. FINDINGS: Brain: No evidence of acute infarction, hemorrhage, hydrocephalus, extra-axial collection or mass lesion/mass effect. Again seen is mild diffuse atrophy. There is moderate patchy periventricular and deep white matter hypodensity similar to the prior study. Extra-axial calcified mass in the right frontal region represents calcified meningioma measuring 9 by 7 mm, unchanged from prior. Vascular: Atherosclerotic calcifications are present within the cavernous internal carotid arteries. Skull: Normal. Negative for fracture or focal lesion. Sinuses/Orbits: No acute finding. Other: None. IMPRESSION: 1. No acute intracranial process. 2. Stable atrophy and chronic microvascular ischemic changes of the white matter. Electronically Signed   By: Darliss Cheney M.D.   On: 09/16/2022 21:27   DG CHEST PORT 1 VIEW  Result Date: 09/16/2022 CLINICAL  DATA:  Leukocytosis and weakness, initial encounter EXAM: PORTABLE CHEST 1 VIEW COMPARISON:  09/09/2022 FINDINGS: Cardiac shadow is stable. Aortic calcifications are again seen. Central vascular congestion is noted without significant edema. Persistent left basilar atelectasis is noted. No new focal abnormality is seen. IMPRESSION: Vascular congestion and left basilar atelectasis Electronically Signed   By: Alcide Clever M.D.    On: 09/16/2022 17:04    Anti-infectives: Anti-infectives (From admission, onward)    Start     Dose/Rate Route Frequency Ordered Stop   09/18/22 1230  Ampicillin-Sulbactam (UNASYN) 3 g in sodium chloride 0.9 % 100 mL IVPB        3 g 200 mL/hr over 30 Minutes Intravenous Every 6 hours 09/18/22 1139     09/16/22 2200  piperacillin-tazobactam (ZOSYN) IVPB 3.375 g  Status:  Discontinued        3.375 g 12.5 mL/hr over 240 Minutes Intravenous Every 8 hours 09/16/22 1345 09/18/22 1139   09/16/22 1400  DAPTOmycin (CUBICIN) 600 mg in sodium chloride 0.9 % IVPB  Status:  Discontinued        8 mg/kg  76.1 kg (Adjusted) 124 mL/hr over 30 Minutes Intravenous Daily 09/16/22 1042 09/18/22 1139   09/16/22 1100  ceFEPIme (MAXIPIME) 2 g in sodium chloride 0.9 % 100 mL IVPB  Status:  Discontinued        2 g 200 mL/hr over 30 Minutes Intravenous Every 12 hours 09/16/22 1042 09/16/22 1333   09/15/22 1400  DAPTOmycin (CUBICIN) 600 mg in sodium chloride 0.9 % IVPB  Status:  Discontinued        8 mg/kg  76.1 kg (Adjusted) 124 mL/hr over 30 Minutes Intravenous Every 48 hours 09/13/22 0857 09/16/22 1042   09/15/22 1000  fluconazole (DIFLUCAN) IVPB 200 mg       Placed in "Followed by" Linked Group   200 mg 100 mL/hr over 60 Minutes Intravenous Daily 09/14/22 1354 09/28/22 0959   09/14/22 1445  fluconazole (DIFLUCAN) IVPB 400 mg       Placed in "Followed by" Linked Group   400 mg 100 mL/hr over 120 Minutes Intravenous  Once 09/14/22 1354 09/14/22 1710   09/13/22 1000  DAPTOmycin (CUBICIN) 600 mg in sodium chloride 0.9 % IVPB        8 mg/kg  76.1 kg (Adjusted) 124 mL/hr over 30 Minutes Intravenous Daily 09/13/22 0850 09/13/22 1435   09/12/22 1105  vancomycin variable dose per unstable renal function (pharmacist dosing)  Status:  Discontinued         Does not apply See admin instructions 09/12/22 1105 09/13/22 0850   09/11/22 0000  vancomycin (VANCOREADY) IVPB 750 mg/150 mL  Status:  Discontinued         750 mg 150 mL/hr over 60 Minutes Intravenous Every 48 hours 09/09/22 0010 09/12/22 1104   09/09/22 1200  ceFEPIme (MAXIPIME) 2 g in sodium chloride 0.9 % 100 mL IVPB  Status:  Discontinued        2 g 200 mL/hr over 30 Minutes Intravenous Every 24 hours 09/09/22 0010 09/16/22 1042   09/09/22 1000  metroNIDAZOLE (FLAGYL) IVPB 500 mg  Status:  Discontinued        500 mg 100 mL/hr over 60 Minutes Intravenous Every 12 hours 09/09/22 0737 09/16/22 1345   09/09/22 0045  vancomycin (VANCOREADY) IVPB 2000 mg/400 mL        2,000 mg 200 mL/hr over 120 Minutes Intravenous  Once 09/08/22 2358 09/09/22 0252   09/08/22 1345  piperacillin-tazobactam (ZOSYN)  IVPB 3.375 g        3.375 g 100 mL/hr over 30 Minutes Intravenous  Once 09/08/22 1336 09/08/22 1444        Assessment/Plan  Stage III gluteal pressure wound S/P I&D 7/16 Dr. Michaell Cowing  - BID dressing changes to largest portion of wound, PRN if soiled with BM - pressure redistribution as able - cultures with rare candida, rare bacteroides - no further abx needed from surgical standpoint - increased fibrinous tissue in would base today - PT hydrotherapy ordered, will start santyl as well but I do not think this needs further operative debridement - updated her daughter at the bedside  - will see again Thursday    FEN: D2 diet  VTE: SQH ID: [email or web address], diflucan    - per primary -  COVID+ - diagnosed 09/06/22 MRSA bacteremia  AKI on CKD stage IIIb Anemia of chronic disease HTN T2DM Physical deconditioning   LOS: 10 days      Juliet Rude, [facility] 09/18/2022, 1:16 PM Please see Amion for pager number during day hours 7:00am-4:30pm

## 2022-09-18 NOTE — Progress Notes (Signed)
Speech Language Pathology Treatment: Dysphagia  Patient Details Name: MARGO LAMA MRN: [id number] DOB: October 10, 1932 Today's Date: 09/18/2022 Time: 0945-1000 SLP Time Calculation (min) (ACUTE ONLY): 15 min  Assessment / Plan / Recommendation Clinical Impression  Patient seen by SLP for skilled treatment focused on dysphagia goals. Patient was awake, alert and daughter in law present. Patient is hopeful to be able to have some PO's other than ice and PRN applesauce. As compared to SLP session documented on previous date, today patient is fully awake and alert, able to follow commands and hold cup and feed self cup sips of water. Prior to PO's, SLP assisted patient with oral care and aside from mildly dry lips and oral mucosa, no secretions present. Patient drank small sips of water from cup, exhibiting suspected delayed swallow initiation but no immediate coughing or throat clearing. She exhibited prolonged mastication and bolus formation with piece of graham cracker but only trace residuals in oral cavity post initial swallow and full clearance of oral residuals after she took sip of water. She exhibited a couple instances of delayed coughing following PO intake but difficult to determine if this is a cough from aspiration. Per daughter in law, following inpatient rehab in 2021, patient was living at an ALF and eating regular consistency foods. SLP recommending initiate PO diet of Dys 2 (minced/chopped), thin liquids and will follow for toleration.    HPI HPI: 89 y.o. female admitted on 09/08/2022 with  medical history significant of hypertension, diabetes mellitus type 2, prior stroke, pancreatitis, hyperlipidemia, CKD stage III, and morbid obesity presents to the ER from ALF due to severe weakness and lethargy. Pt with severe gluteal abscess s/p debridement in the OR on 7/16.  She began having AMS on 7/17 and has been lethargic and overtly coughing on secretions and with po.  Prior MBS completed in 2021  after CVA  (left thalamic hematoma) revealed no aspiration or penetration, swallow trigger at vallecular space, minimal retention cleared with ddry swallow. BSE ordered.      SLP Plan  Continue with current plan of care      Recommendations for follow up therapy are one component of a multi-disciplinary discharge planning process, led by the attending physician.  Recommendations may be updated based on patient status, additional functional criteria and insurance authorization.    Recommendations  Diet recommendations: Dysphagia 2 (fine chop);Thin liquid Liquids provided via: Cup;Straw Medication Administration: Whole meds with puree Supervision: Patient able to self feed;Full supervision/cueing for compensatory strategies;Staff to assist with self feeding Compensations: Slow rate;Small sips/bites;Lingual sweep for clearance of pocketing Postural Changes and/or Swallow Maneuvers: Seated upright 90 degrees                  Oral care BID   Frequent or constant Supervision/Assistance Dysphagia, oropharyngeal phase (R13.12)     Continue with current plan of care    Angela Nevin, MA, CCC-SLP Speech Therapy

## 2022-09-18 NOTE — Progress Notes (Signed)
Progress Note   Patient: [name] [name] ZOX:[id number] DOB: 1932-03-27 DOA: 09/08/2022     10 DOS: the patient was seen and examined on 09/18/2022   Brief hospital course: 89yow from [facility], PMH including stroke w/ RUE/RLE hemparesis, near bedbound, DM type 2, presented with worsening weakness, lethargy and back pain.  Diagnosed with COVID-19 on 09/06/2022 at ALF and started on Paxlovid. Was also started on Bactrim for diagnosis of cellulitis of the back.  On presentation, WBCs was 18.5, creatinine was 3.24.  CT of abdomen and pelvis without contrast showed right sacral wound extending from the skin surface to the outer surface of the gluteal musculature with significant gas collection.   7/12 Admitted for cellulitis, AKI, COVID. She was started on IV fluids and antibiotics.   7/13 General surgery was consulted.  Conservative management recommended. Respiratory status/SOB impaired exam. 7/14 Palliative care consulted for goals of care: full code, full scope at that time.   7/15 Nephrology consulted for AKI. Not a candidate for RRT. General surgery plans operative exploration  7/16 to OR for extensive debridement.  7/17 general surgery "Prognosis poor in this elderly woman with limited mobility. This decubitus ulcer will never heal and will be a chronic wound.  7/18 encephalopathic. Made DNR. 7/20 remains encephalopathic. Renal function improved. WBC higher. Prognosis guarded, may not survive this hospitalization.  Consultants General surgery 7/13 Nephrology  Palliative medicine 7/14  Procedures 7/16 INCISION AND DRAINAGE ABSCESS WITH EXTENSIVE DEBRIDEMENT   Assessment and Plan: Right posterior gluteal decubitus abscess with wet gangrene and necrosis Sepsis ruled out Status post incision and drainage with extensive debridement 7/16 per general surgery.  Blood culture Staphylococcus epidermidis in 1 set.  Contaminant.  Repeat blood cultures from 09/10/2022 no growth, final Wound  cultures: rare Candida, bacteroides ovatus, fragilis; beta lactamase positive Changed abx to Zosyn 7/20. Continue fluconazole. Can narrow to Unasyn Continue wound care. WBC without sig change, 17.8 > 17.5   Acute kidney injury on chronic kidney disease stage IIIb Acute metabolic acidosis Creatinine baseline 1.4-1.8.  Creatinine 3.04 on presentation.  Secondary to ATN from infection, soft BPs, Lasix. Treated with albumin. Renal ultrasound showed no hydronephrosis.   Creatinine 1.42 >1.34>1.22. Has recovered to baseline   Dysphagia Acute delirium Acute toxic encephalopathy Dysphagia secondary to acute delirium secondary to acute infection, perioperative state. Mentation clearing rapidly, ADR to Cefepime CT head 7/20 negative Continue supportive care.  Hypernatremia D5W. BMP in AM.   COVID-19 -- resolved Diagnosed on 09/06/2022.  Continue isolation.  Given renal failure, Paxlovid held. Continue supportive care. Respiratory status remains stable    Constipation.    Continue bowel regimen.  No food intake over the last 48 hours.   Hyponatremia -- resolved   Normocytic anemia Probably secondary to acute illness.  Hgb stable Follow-up as an outpatient.   Essential hypertension Labile, but stable.  Oral hydralazine and metoprolol on hold.   Continue metoprolol and hydralazine scheduled IV.   Diabetes mellitus type 2 with hyperglycemia A1c 9.8.   CBG stable.  Continue sliding scale insulin, meal coverage, Semglee   Physical deconditioning PT recommending SNF placement.  TOC following   Goals of care DNR    PMH stroke with RUE and RLE hemiparesis   Much better today.      Subjective:  Spilled her juice, frustrated  Physical Exam: Vitals:   09/18/22 0525 09/18/22 0623 09/18/22 1300 09/18/22 1326  BP:  (!) 176/77 (!) 142/73   Pulse:  95 71  Resp:  20    Temp:  98.2 F (36.8 C) 98 F (36.7 C)   TempSrc:   Oral   SpO2:  95% 96% 95%  Weight: 136 kg     Height:        Physical Exam Vitals reviewed.  Constitutional:      General: She is not in acute distress.    Appearance: She is not ill-appearing or toxic-appearing.     Comments: Sitting up in chair  Cardiovascular:     Rate and Rhythm: Normal rate and regular rhythm.     Heart sounds: No murmur heard. Pulmonary:     Effort: Pulmonary effort is normal. No respiratory distress.     Breath sounds: No wheezing, rhonchi or rales.  Neurological:     Mental Status: She is alert.  Psychiatric:        Mood and Affect: Mood normal.        Behavior: Behavior normal.     Comments: Very alert and speech clearer today.     Data Reviewed: CBG stable Creatinine trending down WBC stable 17.5 Hgb stable 8.8  Family Communication: none present  Disposition: Status is: Inpatient Remains inpatient appropriate because: infection     Time spent: 30 minutes  Author: Brendia Sacks, MD 09/18/2022 4:59 PM  For on call review [email or web address].

## 2022-09-18 NOTE — Progress Notes (Signed)
Physical Therapy Treatment Patient Details Name: [name] [name] MRN: [id number] DOB: [birth year] Today's Date: 09/18/2022   History of Present Illness Pt is an 89yo female admitted for cellutilis due to sacral wound and AKI, found to be COVID+ with severe weakness. PMH: DM, HTN, hx of CVA, HLD, CKD3.  CT abdomen and pelvis with right sacral wound extending from skin surface to the outer surface of gluteal musculature with significant gas collection.  General surgery also consulted and following.  Patient placed on IV fluids and antibiotics.  Wound care also following.  Patient being considered for debridement next week.    PT Comments  General Comments: increased alertness this session but remains confused to current situation.  Following repeat functional commands.  Interacting with more complete sentences this session.  "I get out of bed one the left". Assisted OOB to recliner was difficult.  General bed mobility comments: supine to sit to EOB required increased effort and time using + 2 assist pt no more that 10% self able.  No functioanl use R UIE which appear VERY edenamous.  Poor sitting balance.  Max posterior lean.  Poor self correction to midline.  Max c/o weakness/fatigue. General transfer comment: pt was unable to properly grasp walker with either hand, so side by side + 2 assist to rise from elevated bed then incomplete pivot to recliner requiring Total assist to scoot hips back.  Used STEDY to perform one sit to stand to place [employer] under pt requiring + 3 assist to safely perform.  Pt unable to support her weight.  Profoundly weak and edematous.  Rec MAXI MOVE LIFT.  Pt will need ST Rehab at SNF to address mobility and functional decline prior to safely returning home.     Assistance Recommended at Discharge Intermittent Supervision/Assistance  If plan is discharge home, recommend the following:  Can travel by private vehicle    A lot of help with walking and/or transfers;A  lot of help with bathing/dressing/bathroom;Assistance with cooking/housework;Assist for transportation;Help with stairs or ramp for entrance   No  Equipment Recommendations  None recommended by PT    Recommendations for Other Services       Precautions / Restrictions Precautions Precautions: Fall Precaution Comments: right sacral wound I&D 09/12/22, R hemiparesis from previous CVA, COVID Restrictions Weight Bearing Restrictions: No     Mobility  Bed Mobility Overal bed mobility: Needs Assistance Bed Mobility: Supine to Sit     Supine to sit: Total assist, +2 for physical assistance, +2 for safety/equipment     General bed mobility comments: supine to sit to EOB required increased effort and time using + 2 assist pt no more that 10% self able.  No functioanl use R UIE which appear VERY edenamous.  Poor sitting balance.  Max posterior lean.  Poor self correction to midline.  Max c/o weakness/fatigue.    Transfers Overall transfer level: Needs assistance Equipment used: None Transfers: Sit to/from Stand, Bed to chair/wheelchair/BSC Sit to Stand: Max assist Stand pivot transfers: +2 physical assistance, +2 safety/equipment, From elevated surface, Total assist, Max assist         General transfer comment: pt was unable to properly grasp walker with either hand, so side by side + 2 assist to rise from elevated bed then incomplete pivot to recliner requiring Total assist to scoot hips back.  Used STEDY to perform one sit to stand to place [employer] under pt requiring + 3 assist to safely perform.  Pt unable  to support her weight.  Rec MAXI MOVE LIFT.    Ambulation/Gait                   Stairs             Wheelchair Mobility     Tilt Bed    Modified Rankin (Stroke Patients Only)       Balance                                            Cognition Arousal/Alertness: Awake/alert Behavior During Therapy: Flat affect Overall  Cognitive Status: No family/caregiver present to determine baseline cognitive functioning                                 General Comments: increased alertness this session but remains confused to current situation.  Following repeat functional commands.  Interacting with more complete sentences this session.  "I get out of bed one the left".        Exercises      General Comments        Pertinent Vitals/Pain Pain Assessment Pain Assessment: Faces Faces Pain Scale: Hurts little more Pain Location: bottom (wound area) Pain Descriptors / Indicators: Discomfort, Grimacing Pain Intervention(s): Monitored during session, Repositioned    Home Living                          Prior Function            PT Goals (current goals can now be found in the care plan section) Progress towards PT goals: Progressing toward goals    Frequency    Min 1X/week      PT Plan Current plan remains appropriate    Co-evaluation              AM-PAC PT "6 Clicks" Mobility   Outcome Measure  Help needed turning from your back to your side while in a flat bed without using bedrails?: Total Help needed moving from lying on your back to sitting on the side of a flat bed without using bedrails?: Total Help needed moving to and from a bed to a chair (including a wheelchair)?: Total Help needed standing up from a chair using your arms (e.g., wheelchair or bedside chair)?: Total Help needed to walk in hospital room?: Total Help needed climbing 3-5 steps with a railing? : Total 6 Click Score: 6    End of Session Equipment Utilized During Treatment: Gait belt Activity Tolerance: Treatment limited secondary to medical complications (Comment) Patient left: in chair;with call bell/phone within reach Nurse Communication: Mobility status;Need for lift equipment PT Visit Diagnosis: Other abnormalities of gait and mobility (R26.89);Muscle weakness (generalized) (M62.81)      Time: 1333-1400 PT Time Calculation (min) (ACUTE ONLY): 27 min  Charges:    $Therapeutic Activity: 23-37 mins PT General Charges $$ ACUTE PT VISIT: 1 Visit                     {Jamere Stidham  PTA Acute  Rehabilitation Services Office M-F          [phone]

## 2022-09-18 NOTE — Progress Notes (Signed)
Physical Therapy Treatment Patient Details Name: [name] [name] MRN: [id number] DOB: 1932/05/11 Today's Date: 09/18/2022   History of Present Illness Pt is an 89yo female admitted for cellutilis due to sacral wound and AKI, found to be COVID+ with severe weakness. PMH: DM, HTN, hx of CVA, HLD, CKD3.  CT abdomen and pelvis with right sacral wound extending from skin surface to the outer surface of gluteal musculature with significant gas collection.  General surgery also consulted and following.  Patient placed on IV fluids and antibiotics.  Wound care also following.  Patient being considered for debridement next week.    PT Comments  Called to room to assist pt back to bed via [employer].  Rolled to left to place bed pan.  Assisted by 2 nurses.      Assistance Recommended at Discharge Intermittent Supervision/Assistance  If plan is discharge home, recommend the following:  Can travel by private vehicle    A lot of help with walking and/or transfers;A lot of help with bathing/dressing/bathroom;Assistance with cooking/housework;Assist for transportation;Help with stairs or ramp for entrance   No  Equipment Recommendations  None recommended by PT    Recommendations for Other Services       Precautions / Restrictions Precautions Precautions: Fall Precaution Comments: right sacral wound I&D 09/12/22, R hemiparesis from previous CVA, COVID Restrictions Weight Bearing Restrictions: No           Cognition Arousal/Alertness: Awake/alert Behavior During Therapy: Flat affect Overall Cognitive Status: No family/caregiver present to determine baseline cognitive functioning                                 General Comments: increased alertness this session but remains confused to current situation.  Following repeat functional commands.  Interacting with more complete sentences this session.  "I get out of bed one the left".        Exercises      General Comments         Pertinent Vitals/Pain Pain Assessment Pain Assessment: Faces Faces Pain Scale: Hurts little more Pain Location: bottom (wound area) Pain Descriptors / Indicators: Discomfort, Grimacing Pain Intervention(s): Monitored during session, Repositioned    Home Living                          Prior Function            PT Goals (current goals can now be found in the care plan section) Progress towards PT goals: Progressing toward goals    Frequency    Min 1X/week      PT Plan Current plan remains appropriate    Co-evaluation              AM-PAC PT "6 Clicks" Mobility   Outcome Measure  Help needed turning from your back to your side while in a flat bed without using bedrails?: Total Help needed moving from lying on your back to sitting on the side of a flat bed without using bedrails?: Total Help needed moving to and from a bed to a chair (including a wheelchair)?: Total Help needed standing up from a chair using your arms (e.g., wheelchair or bedside chair)?: Total Help needed to walk in hospital room?: Total Help needed climbing 3-5 steps with a railing? : Total 6 Click Score: 6    End of Session Equipment Utilized During Treatment: Gait belt Activity Tolerance: Treatment limited  secondary to medical complications (Comment) Patient left: in bed Nurse Communication: Mobility status;Need for lift equipment PT Visit Diagnosis: Other abnormalities of gait and mobility (R26.89);Muscle weakness (generalized) (M62.81)     Time: 4696-2952 PT Time Calculation (min) (ACUTE ONLY): 20 min  Charges:    $Therapeutic Activity: 8-22 mins PT General Charges $$ ACUTE PT VISIT: 1 Visit                    Felecia Shelling  PTA Acute  Rehabilitation Services Office M-F          [phone]

## 2022-09-19 DIAGNOSIS — R1310 Dysphagia, unspecified: Secondary | ICD-10-CM | POA: Diagnosis not present

## 2022-09-19 DIAGNOSIS — L0231 Cutaneous abscess of buttock: Secondary | ICD-10-CM | POA: Diagnosis not present

## 2022-09-19 DIAGNOSIS — G929 Unspecified toxic encephalopathy: Secondary | ICD-10-CM | POA: Diagnosis not present

## 2022-09-19 DIAGNOSIS — N179 Acute kidney failure, unspecified: Secondary | ICD-10-CM | POA: Diagnosis not present

## 2022-09-19 LAB — GLUCOSE, CAPILLARY
Glucose-Capillary: 144 mg/dL — ABNORMAL HIGH (ref 70–99)
Glucose-Capillary: 118 mg/dL — ABNORMAL HIGH (ref 70–99)
Glucose-Capillary: 139 mg/dL — ABNORMAL HIGH (ref 70–99)
Glucose-Capillary: 130 mg/dL — ABNORMAL HIGH (ref 70–99)

## 2022-09-19 MED ORDER — SENNA 8.6 MG PO TABS
1.0000 | ORAL_TABLET | Freq: Every day | ORAL | Status: DC
  Administered 2022-09-19 – 2022-09-21 (×2): 8.6 mg via ORAL
  Filled 2022-09-19 (×3): qty 1

## 2022-09-19 MED ORDER — POLYETHYLENE GLYCOL 3350 17 G PO PACK
17.0000 g | PACK | Freq: Two times a day (BID) | ORAL | Status: DC
  Administered 2022-09-19 – 2022-09-20 (×3): 17 g via ORAL
  Filled 2022-09-19 (×4): qty 1

## 2022-09-19 NOTE — Progress Notes (Addendum)
   09/19/22 1030  Hydrotherapy evaluation  Subjective Assessment  Subjective That's cold  Patient and Family Stated Goals to make wound better- son  Date of Onset  (present on adm)  Prior Treatments  (unsure)  Pressure Injury 09/19/22 Sacrum Right Unstageable - Full thickness tissue loss in which the base of the injury is covered by slough (yellow, tan, gray, green or brown) and/or eschar (tan, brown or black) in the wound bed. large  sacral wound with necroti  Date First Assessed: 09/19/22   Location: Sacrum  Location Orientation: Right  Staging: Unstageable - Full thickness tissue loss in which the base of the injury is covered by slough (yellow, tan, gray, green or brown) and/or eschar (tan, brown or blac...  Dressing Type Foam - Lift dressing to assess site every shift;Gauze (Comment);Santyl;Moist to moist  Dressing Clean, Dry, Intact  Dressing Change Frequency Twice a day  Site / Wound Assessment Brown  % Wound base Red or Granulating 0%  % Wound base Other/Granulation Tissue (Comment) 100%  Peri-wound Assessment Maceration  Wound Length (cm) 6 cm  Wound Width (cm) 4 cm  Wound Depth (cm) 3 cm  Wound Surface Area (cm^2) 24 cm^2  Wound Volume (cm^3) 72 cm^3  Tunneling (cm) 7 at 5:00, 4 at 12:00,  Margins Unattached edges (unapproximated)  Drainage Amount Moderate  Drainage Description Odor - foul;Serosanguineous;Purulent  Treatment Debridement (Selective);Hydrotherapy (Pulse lavage);Packing (Saline gauze)  Hydrotherapy  Pulsed Lavage with Suction (psi) 12 psi  Pulsed Lavage with Suction - Normal Saline Used 1000 mL  Pulsed Lavage Tip Tip with splash shield  Pulsed lavage therapy - wound location sacrum, righ glute  Selective Debridement (non-excisional)  Selective Debridement (non-excisional) - Location sacrum  Selective Debridement (non-excisional) - Tools Used Forceps;Scissors  Selective Debridement (non-excisional) - Tissue Removed slough, necrotic  Wound Therapy -  Assess/Plan/Recommendations  Wound Therapy - Clinical Statement The  patient admitted 7/29/24with a  wound located at scarum to right uttock. The wound is 100% slough/ necrotic tissue. The patient was premedicated for pain  prior to  hydrotherapy treatment. The patient resides in ALF, transfers to [facility] at baseline. The patient will benenfit from Hydrotherapy to decrease nonviable  tissue and decrease  bioburden effects of wound. Hydrotherapy to pe berformed 2 x week with dressing change.  Wound Therapy - Functional Problem List WC bound  Factors Delaying/Impairing Wound Healing Immobility;Incontinence;Multiple medical problems  Hydrotherapy Plan Debridement;Dressing change;Patient/family education;Pulsatile lavage with suction  Wound Therapy - Frequency 2X / week  Wound Therapy - Current Recommendations Case manager/social work  Wound Therapy - Follow Up Recommendations f/u pulsed lavage with suction;f/u selective debridement  Wound Therapy Goals - Improve the function of patient's integumentary system by progressing the wound(s) through the phases of wound healing by:  Decrease Necrotic Tissue to 50  Decrease Necrotic Tissue - Progress Goal set today  Increase Granulation Tissue to 50  Increase Granulation Tissue - Progress Goal set today  Additional Wound Therapy Goal reposition in bed  Additional Wound Therapy Goal - Progress Goal set today  Time For Goal Achievement 2 weeks  Wound Therapy - Potential for Goals Fair  Blanchard Kelch PT Acute Rehabilitation Services Office [phone] Weekend [phone] Eval, deb< 20 CM2, dr x 1

## 2022-09-19 NOTE — Progress Notes (Signed)
Speech Language Pathology Treatment: Dysphagia  Patient Details Name: [name] [name] MRN: [id number] DOB: 1932-03-09 Today's Date: 09/19/2022 Time: 4098-1191 SLP Time Calculation (min) (ACUTE ONLY): 27 min  Assessment / Plan / Recommendation Clinical Impression  Patient seen to address dysphagia goals. She is fully alert sitting upright in bed requesting to be wiped down. RN reports patient tolerating intake but poor consumption. Patient indicates that she thinks she would eat more if she had her diet advanced to allow solid foods. Note significant hoarseness which patient endorses occurred during her prior COVID occurrence. Observed patient with intake including graham crackers, pudding, and water. Minimal labial retention on the right of graham cracker but adequate oral clearance. No indication of aspiration across all p.o. trials. Patient continues with a few small ulcerations on the roof of her mouth but denies discomfort. Recommend to advance diet to dysphagia 3 and thin to help maximize intake and to compensate for her baseline mild oral deficits and her ulceration. Advised patient to rinse and expectorate or swallow with water after meals and avoid acidic, hot temperature, spicy, or hard foods that may cause discomfort. No evidence of discomfort with all p.o. observed today. [name] [name] has made excellent progress with her swallowing goals and using teach back was able to verbalize her need to consume high-prot intake to maximize nutritional benefit to facilitate wound healing. Patient states she will eat yogurt. SLP shared picture of patient's mouth/ulcerations with MD via secure chat and with RN. Thank for allowing SLP to help with pt's care plan.     HPI HPI: 89 y.o. female admitted on 09/08/2022 with  medical history significant of hypertension, diabetes mellitus type 2, prior stroke, pancreatitis, hyperlipidemia, CKD stage III, and morbid obesity presents to the ER from ALF due to severe  weakness and lethargy. Pt with severe gluteal abscess s/p debridement in the OR on 7/16.  She began having AMS on 7/17 and has been lethargic and overtly coughing on secretions and with po.  Prior MBS completed in 2021 after CVA  (left thalamic hematoma) revealed no aspiration or penetration, swallow trigger at vallecular space, minimal retention cleared with ddry swallow. BSE ordered.      SLP Plan  Continue with current plan of care      Recommendations for follow up therapy are one component of a multi-disciplinary discharge planning process, led by the attending physician.  Recommendations may be updated based on patient status, additional functional criteria and insurance authorization.    Recommendations  Diet recommendations: Dysphagia 3 (mechanical soft);Thin liquid Liquids provided via: Cup;Straw Medication Administration: Other (Comment) (as tolerated) Supervision: Patient able to self feed;Intermittent supervision to cue for compensatory strategies Compensations: Slow rate;Small sips/bites;Lingual sweep for clearance of pocketing Postural Changes and/or Swallow Maneuvers: Seated upright 90 degrees (upright as able)                  Oral care BID   Frequent or constant Supervision/Assistance Dysphagia, oropharyngeal phase (R13.12)     Continue with current plan of care      [name] [name]  09/19/2022, 2:23 PM   [name] Infante, MS [facility] SLP Acute Rehab Services Office [phone]  Media Information   Document Information  Photos    09/19/2022 13:57  Attached To:  Hospital Encounter on 09/08/22  Source Information  Hassel Neth, CCC-SLP  [facility]  Document History    Few ulcerations on palate - hard and soft - pt denies discomfort

## 2022-09-19 NOTE — Progress Notes (Addendum)
Progress Note   Patient: [name] [name] ZDG:[id number] DOB: 10-10-1932 DOA: 09/08/2022     11 DOS: the patient was seen and examined on 09/19/2022   Brief hospital course: 89yow from [facility], PMH including stroke w/ RUE/RLE hemparesis, near bedbound, DM type 2, presented with worsening weakness, lethargy and back pain.  Diagnosed with COVID-19 on 09/06/2022 at ALF and started on Paxlovid. Was also started on Bactrim for diagnosis of cellulitis of the back.  On presentation, WBCs was 18.5, creatinine was 3.24.  CT of abdomen and pelvis without contrast showed right sacral wound extending from the skin surface to the outer surface of the gluteal musculature with significant gas collection.   7/12 Admitted for cellulitis, AKI, COVID. She was started on IV fluids and antibiotics.   7/13 General surgery was consulted.  Conservative management recommended. Respiratory status/SOB impaired exam. 7/14 Palliative care consulted for goals of care: full code, full scope at that time.   7/15 Nephrology consulted for AKI. Not a candidate for RRT. General surgery plans operative exploration  7/16 to OR for extensive debridement.  7/17 general surgery "Prognosis poor in this elderly woman with limited mobility. This decubitus ulcer will never heal and will be a chronic wound.  7/18 encephalopathic. Made DNR. 7/20 remains encephalopathic. Renal function improved. WBC higher. Prognosis guarded, may not survive this hospitalization. 7/23 cephalopathy resolved, progressing towards discharge likely in the next 48 hours.  Consultants General surgery 7/13 Nephrology  Palliative medicine 7/14  Procedures 7/16 INCISION AND DRAINAGE ABSCESS WITH EXTENSIVE DEBRIDEMENT   Assessment and Plan: Right posterior gluteal decubitus abscess with wet gangrene and necrosis Sepsis ruled out Status post incision and drainage with extensive debridement 7/16 per general surgery.  Blood culture Staphylococcus epidermidis  in 1 set.  Contaminant.  Repeat blood cultures from 09/10/2022 no growth, final Wound cultures: rare Candida, bacteroides ovatus, fragilis; beta lactamase positive Changed abx to Zosyn 7/20. Continue fluconazole. Can narrow to Unasyn.  Would complete a few more days of antibiotics and stop. Continue wound care. WBC without sig change, 17.8 > 17.5.  Follow-up CBC in about a week.   Acute kidney injury on chronic kidney disease stage IIIb Acute metabolic acidosis Creatinine baseline 1.4-1.8.  Creatinine 3.04 on presentation.  Secondary to ATN from infection, soft BPs, Lasix. Treated with albumin. Renal ultrasound showed no hydronephrosis.   Creatinine 1.42 >1.34>1.22. Has recovered to baseline   Dysphagia Oral candidiasis Acute delirium Acute toxic encephalopathy Dysphagia secondary to acute delirium secondary to acute infection, perioperative state.  Now recovering with clearing of mentation. Mentation clearing rapidly, ADR to Cefepime CT head 7/20 negative Continue supportive care.  Doing quite well now. Oral candidiasis improving. Continue fluconazole.   Hypernatremia Check MPN AM.   COVID-19 -- resolved Diagnosed on 09/06/2022.  Continue isolation.  Given renal failure, Paxlovid held. Soft.    Constipation.    Continue bowel regimen.  Did not eat for several days so not surprising last stool was on 7/18.  However now eating over the last 24 hours would expect bowel movement in the next 1 to 2 days.   Hyponatremia -- resolved   Normocytic anemia Probably secondary to acute illness.  Hgb stable Follow-up as an outpatient.  Repeat CBC in AM.   Essential hypertension Mild elevated but stable.  Continue oral hydralazine and metoprolol.   Diabetes mellitus type 2 with hyperglycemia A1c 9.8.   CBG stable.  Continue sliding scale insulin, meal coverage, Semglee   Physical deconditioning PT recommending  SNF placement.  TOC following   Goals of care DNR    PMH stroke with RUE  and RLE hemiparesis  Overall much improved.  Likely need to SNF in 48 hours.    Subjective:  Feels better today  Physical Exam: Vitals:   09/18/22 1326 09/18/22 1957 09/19/22 0554 09/19/22 1041  BP:  (!) 179/66 (!) 152/59 (!) 155/50  Pulse:  94 79 76  Resp:  (!) 22 18 15   Temp:  98.2 F (36.8 C) 98.2 F (36.8 C) 98.6 F (37 C)  TempSrc:  Oral Oral Oral  SpO2: 95% 95% 91% 90%  Weight:      Height:       Physical Exam Vitals reviewed.  Constitutional:      General: She is not in acute distress.    Appearance: She is not ill-appearing or toxic-appearing.  Cardiovascular:     Rate and Rhythm: Normal rate and regular rhythm.     Heart sounds: No murmur heard. Pulmonary:     Effort: Pulmonary effort is normal. No respiratory distress.     Breath sounds: No wheezing, rhonchi or rales.  Neurological:     Mental Status: She is alert.  Psychiatric:        Mood and Affect: Mood normal.        Behavior: Behavior normal.     Data Reviewed: CBG stable  Family Communication: daughter at bedside  Disposition: Status is: Inpatient Remains inpatient appropriate because: recovering from encephalopathy and infection     Time spent: 20 minutes  Author: Brendia Sacks, MD 09/19/2022 7:07 PM  For on call review [email or web address].

## 2022-09-19 NOTE — TOC Progression Note (Signed)
Transition of Care [facility]) - Progression Note    Patient Details  Name: [name] [name] MRN: [id number] Date of Birth: [birth year]  Transition of Care [facility]) CM/SW Contact  Otelia Santee, LCSW Phone Number: 09/19/2022, 3:32 PM  Clinical Narrative:    Spoke with pt's son and shared that [facility] is able to extend bed offer under private pay as pt's insurance is out-of-network. Pt's son accepting of this information. Pt' son wanting to know if [facility], [facility], or [facility] has availability.  Contacted : [facility]- denied due to wound [facility]- Left VM [facility]- Admissions is out of office until tomorrow.     Expected Discharge Plan: Skilled Nursing Facility Barriers to Discharge: Continued Medical Work up, SNF Covid  Expected Discharge Plan and Services In-house Referral: Clinical Social Work Discharge Planning Services: NA Post Acute Care Choice: Skilled Nursing Facility Living arrangements for the past 2 months: Independent Living Facility                 DME Arranged: N/A DME Agency: NA                   Social Determinants of Health (SDOH) Interventions SDOH Screenings   Food Insecurity: No Food Insecurity (09/08/2022)  Housing: Low Risk  (09/08/2022)  Transportation Needs: No Transportation Needs (09/08/2022)  Utilities: Not At Risk (09/08/2022)  Depression (PHQ2-9): Low Risk  (03/27/2022)  Financial Resource Strain: Low Risk  (06/14/2022)   Received from [facility], [facility]  Physical Activity: Inactive (05/19/2019)   Received from [facility], [facility]  Social Connections: Socially Integrated (12/05/2021)   Received from [facility], [facility]  Stress: No Stress Concern Present (12/05/2021)   Received from [facility], [facility]  Tobacco Use: Low Risk  (09/12/2022)    Readmission Risk Interventions     No data to display

## 2022-09-20 DIAGNOSIS — L0231 Cutaneous abscess of buttock: Secondary | ICD-10-CM | POA: Diagnosis not present

## 2022-09-20 LAB — CBC
HCT: 27.9 % — ABNORMAL LOW (ref 36.0–46.0)
Hemoglobin: 8.5 g/dL — ABNORMAL LOW (ref 12.0–15.0)
MCH: 26.5 pg (ref 26.0–34.0)
MCHC: 30.5 g/dL (ref 30.0–36.0)
MCV: 86.9 fL (ref 80.0–100.0)
Platelets: 272 10*3/uL (ref 150–400)
RBC: 3.21 MIL/uL — ABNORMAL LOW (ref 3.87–5.11)
RDW: 18.9 % — ABNORMAL HIGH (ref 11.5–15.5)
WBC: 10.4 10*3/uL (ref 4.0–10.5)
nRBC: 0 % (ref 0.0–0.2)

## 2022-09-20 LAB — GLUCOSE, CAPILLARY
Glucose-Capillary: 160 mg/dL — ABNORMAL HIGH (ref 70–99)
Glucose-Capillary: 176 mg/dL — ABNORMAL HIGH (ref 70–99)
Glucose-Capillary: 176 mg/dL — ABNORMAL HIGH (ref 70–99)
Glucose-Capillary: 53 mg/dL — ABNORMAL LOW (ref 70–99)
Glucose-Capillary: 81 mg/dL (ref 70–99)
Glucose-Capillary: 135 mg/dL — ABNORMAL HIGH (ref 70–99)

## 2022-09-20 LAB — BASIC METABOLIC PANEL
Anion gap: 10 (ref 5–15)
BUN: 21 mg/dL (ref 8–23)
CO2: 28 mmol/L (ref 22–32)
Calcium: 7.7 mg/dL — ABNORMAL LOW (ref 8.9–10.3)
Chloride: 107 mmol/L (ref 98–111)
Creatinine, Ser: 1.21 mg/dL — ABNORMAL HIGH (ref 0.44–1.00)
GFR, Estimated: 43 mL/min — ABNORMAL LOW (ref >60)
Glucose, Bld: 151 mg/dL — ABNORMAL HIGH (ref 70–99)
Potassium: 3.5 mmol/L (ref 3.5–5.1)
Sodium: 145 mmol/L (ref 135–145)

## 2022-09-20 MED ORDER — FLUCONAZOLE 200 MG PO TABS
200.0000 mg | ORAL_TABLET | Freq: Every day | ORAL | Status: DC
  Administered 2022-09-20 – 2022-09-22 (×3): 200 mg via ORAL
  Filled 2022-09-20 (×3): qty 1

## 2022-09-20 MED ORDER — ORAL CARE MOUTH RINSE: 15.0000 mL | OROMUCOSAL | Status: DC | PRN

## 2022-09-20 NOTE — Plan of Care (Signed)

## 2022-09-20 NOTE — TOC Progression Note (Addendum)
Transition of Care [facility]) - Progression Note    Patient Details  Name: [name] [name] MRN: [id number] Date of Birth: 1932/03/29  Transition of Care [facility]) CM/SW Contact  Otelia Santee, LCSW Phone Number: 09/20/2022, 1:58 PM  Clinical Narrative:    Spoke with [facility] who share this pt's insurance is out of network and private pay is $592 for room and board and there will be additional fees for therapy services.  Attempted to reach [facility]. Provided family with number to reach out to.  Pt's family is leaning towards pt going to [facility] for SNF if they do not hear back from [facility].  [facility] would be able to accept pt to their facility on Friday.   Expected Discharge Plan: Skilled Nursing Facility Barriers to Discharge: Continued Medical Work up, SNF Covid  Expected Discharge Plan and Services In-house Referral: Clinical Social Work Discharge Planning Services: NA Post Acute Care Choice: Skilled Nursing Facility Living arrangements for the past 2 months: Independent Living Facility                 DME Arranged: N/A DME Agency: NA                   Social Determinants of Health (SDOH) Interventions SDOH Screenings   Food Insecurity: No Food Insecurity (09/08/2022)  Housing: Low Risk  (09/08/2022)  Transportation Needs: No Transportation Needs (09/08/2022)  Utilities: Not At Risk (09/08/2022)  Depression (PHQ2-9): Low Risk  (03/27/2022)  Financial Resource Strain: Low Risk  (06/14/2022)   Received from [facility], [facility]  Physical Activity: Inactive (05/19/2019)   Received from [facility], [facility]  Social Connections: Socially Integrated (12/05/2021)   Received from [facility], [facility]  Stress: No Stress Concern Present (12/05/2021)   Received from [facility], [facility]  Tobacco Use: Low Risk  (09/12/2022)    Readmission Risk Interventions     No data to display

## 2022-09-20 NOTE — Progress Notes (Signed)
PROGRESS NOTE    [name] [name]  WJX:[id number] DOB: 04/20/1932 DOA: 09/08/2022 PCP: [name], MD   Brief Narrative:  89 y.o. female with medical history significant of hypertension, diabetes mellitus type 2, prior stroke, pancreatitis, hyperlipidemia, CKD stage IIIb, obesity, chronic back wound presented with worsening weakness, lethargy and back pain.  Patient was apparently diagnosed with COVID-19 on 09/06/2022 at her assisted living facility and was started on Paxlovid and was also started on Bactrim for diagnosis of cellulitis of the back.  On presentation, WBCs was 18.5, creatinine was 3.24.  CT of abdomen and pelvis without contrast showed right sacral wound extending from the skin surface to the outer surface of the gluteal musculature with significant gas collection.  She was started on IV fluids and antibiotics.  General surgery was consulted.  Palliative care also consulted for goals of care discussion.  Nephrology was consulted for AKI.  She underwent I&D and extensive debridement on 09/12/2022 by general surgery.  She had issues with encephalopathy: CODE STATUS switched to DNR.  Kidney function has improved and nephrology has signed off.  Encephalopathy improving.  PT recommending SNF placement.  Assessment & Plan:   Right posterior gluteal decubitus abscess with wet gangrene and necrosis Methicillin-resistant coagulase-negative Staphylococcus bacteremia: Possibly contaminant -Status post incision and drainage with extensive debridement 7/16 per general surgery. Blood culture Staphylococcus epidermidis in 1 set. Contaminant. Repeat blood cultures from 09/10/2022 no growth, final  -Wound cultures: rare Candida, bacteroides ovatus, fragilis; beta lactamase positive.  -Currently on Unasyn.  Continue wound care as per general surgery recommendations.  General surgery recommended to stop antibiotics on 09/18/2022. -DC Unasyn today.  Leukocytosis -Resolved  Acute kidney injury on  chronic kidney disease stage IIIb Acute metabolic acidosis: Resolved -Creatinine around 1.4-1.8.  Creatinine 3.04 on presentation.  Peaked to 3.57 during this hospitalization.  Renal ultrasound was negative for hydronephrosis. -Nephrology evaluated the patient and has subsequently signed off. -Renal function back to baseline; creatinine 1.21 today.  Acute delirium/acute metabolic and toxic encephalopathy -Patient was in encephalopathic for few days but mental status is gradually improving.  CT head on 09/16/2022 negative. -Continue supportive care.  Fall precautions.  Delirium precautions.  Dysphagia Oral candidiasis -Improving.  Continue Diflucan.  Diet as per SLP recommendations: Dysphagia 3.  Recent diagnosis of COVID-19 -Diagnosed on 09/06/2022.  Completed isolation and is currently off of isolation.  Hypernatremia -Improved.  Monitor.  Anemia of chronic disease -From chronic illnesses.  Hemoglobin stable.  Monitor intermittently.    Mildly elevated AST -Resolved  Hypertension -Monitor blood pressure.  Continue hydralazine and metoprolol.  Holding losartan, chlorthalidone and Lasix.    Diabetes mellitus type 2 with hyperglycemia -A1c 9.8.  Continue CBGs with SSI, long-acting insulin and continue NovoLog with meals.  Blood sugars currently stable. [name] on hold  Physical deconditioning -PT recommending SNF placement.  TOC following.  Goals of care -CODE STATUS has been changed to DNR during this hospitalization.  Might benefit from outpatient palliative care follow-up  Past medical history of stroke with right upper extremity and right lower extremity hemiparesis  Morbid obesity -Outpatient follow-up  DVT prophylaxis: heparin subcutaneous  code Status: DNR Family Communication: Daughter at bedside Disposition Plan: Status is: Inpatient Remains inpatient appropriate because: Of severity of illness.  Need for SNF patient.  Possibly ready for discharge in 24 to 48  hours.    Consultants: General surgery.  palliative care.  Nephrology  Procedures: None  Antimicrobials:  Anti-infectives (From admission, onward)    Start  Dose/Rate Route Frequency Ordered Stop   09/20/22 1000  fluconazole (DIFLUCAN) tablet 200 mg        200 mg Oral Daily 09/20/22 0820 09/28/22 0959   09/18/22 1230  Ampicillin-Sulbactam (UNASYN) 3 g in sodium chloride 0.9 % 100 mL IVPB        3 g 200 mL/hr over 30 Minutes Intravenous Every 6 hours 09/18/22 1139     09/16/22 2200  piperacillin-tazobactam (ZOSYN) IVPB 3.375 g  Status:  Discontinued        3.375 g 12.5 mL/hr over 240 Minutes Intravenous Every 8 hours 09/16/22 1345 09/18/22 1139   09/16/22 1400  DAPTOmycin (CUBICIN) 600 mg in sodium chloride 0.9 % IVPB  Status:  Discontinued        8 mg/kg  76.1 kg (Adjusted) 124 mL/hr over 30 Minutes Intravenous Daily 09/16/22 1042 09/18/22 1139   09/16/22 1100  ceFEPIme (MAXIPIME) 2 g in sodium chloride 0.9 % 100 mL IVPB  Status:  Discontinued        2 g 200 mL/hr over 30 Minutes Intravenous Every 12 hours 09/16/22 1042 09/16/22 1333   09/15/22 1400  DAPTOmycin (CUBICIN) 600 mg in sodium chloride 0.9 % IVPB  Status:  Discontinued        8 mg/kg  76.1 kg (Adjusted) 124 mL/hr over 30 Minutes Intravenous Every 48 hours 09/13/22 0857 09/16/22 1042   09/15/22 1000  fluconazole (DIFLUCAN) IVPB 200 mg  Status:  Discontinued       Placed in "Followed by" Linked Group   200 mg 100 mL/hr over 60 Minutes Intravenous Daily 09/14/22 1354 09/20/22 0820   09/14/22 1445  fluconazole (DIFLUCAN) IVPB 400 mg       Placed in "Followed by" Linked Group   400 mg 100 mL/hr over 120 Minutes Intravenous  Once 09/14/22 1354 09/14/22 1710   09/13/22 1000  DAPTOmycin (CUBICIN) 600 mg in sodium chloride 0.9 % IVPB        8 mg/kg  76.1 kg (Adjusted) 124 mL/hr over 30 Minutes Intravenous Daily 09/13/22 0850 09/13/22 1435   09/12/22 1105  vancomycin variable dose per unstable renal function (pharmacist  dosing)  Status:  Discontinued         Does not apply See admin instructions 09/12/22 1105 09/13/22 0850   09/11/22 0000  vancomycin (VANCOREADY) IVPB 750 mg/150 mL  Status:  Discontinued        750 mg 150 mL/hr over 60 Minutes Intravenous Every 48 hours 09/09/22 0010 09/12/22 1104   09/09/22 1200  ceFEPIme (MAXIPIME) 2 g in sodium chloride 0.9 % 100 mL IVPB  Status:  Discontinued        2 g 200 mL/hr over 30 Minutes Intravenous Every 24 hours 09/09/22 0010 09/16/22 1042   09/09/22 1000  metroNIDAZOLE (FLAGYL) IVPB 500 mg  Status:  Discontinued        500 mg 100 mL/hr over 60 Minutes Intravenous Every 12 hours 09/09/22 0737 09/16/22 1345   09/09/22 0045  vancomycin (VANCOREADY) IVPB 2000 mg/400 mL        2,000 mg 200 mL/hr over 120 Minutes Intravenous  Once 09/08/22 2358 09/09/22 0252   09/08/22 1345  piperacillin-tazobactam (ZOSYN) IVPB 3.375 g        3.375 g 100 mL/hr over 30 Minutes Intravenous  Once 09/08/22 1336 09/08/22 1444        Subjective: Patient seen and examined at bedside.  No fever, agitation, seizures or vomiting reported.  Still complains of some sore throat.  Daughter at bedside.   Objective: Vitals:   09/20/22 0000 09/20/22 0500 09/20/22 0603 09/20/22 1024  BP:   (!) 144/52 (!) 169/61  Pulse:   73 76  Resp: (!) 24  16   Temp:   98.6 F (37 C)   TempSrc:      SpO2:   92%   Weight:  (!) 137.9 kg    Height:        Intake/Output Summary (Last 24 hours) at 09/20/2022 1204 Last data filed at 09/20/2022 1050 Gross per 24 hour  Intake 1196.97 ml  Output 400 ml  Net 796.97 ml   Filed Weights   09/17/22 0500 09/18/22 0525 09/20/22 0500  Weight: 135.2 kg 136 kg (!) 137.9 kg    Examination:  General: On room air.  No distress.  Looks chronically ill and deconditioned. ENT/neck: No neck masses or elevated JVD noted  respiratory: Decreased breath sounds at bases bilaterally with some crackles  CVS: S1 and S2 are heard; rate controlled mostly Abdominal: Soft,  morbidly obese, nontender, distended slightly; no organomegaly, normal bowel sounds are heard  extremities: Bilateral lower extremity edema present; no cyanosis  CNS: Awake and alert.  Still slightly slow to respond.  Answers some questions.  Poor historian.  No obvious deficits noted  lymph: No obvious cervical lymphadenopathy  skin: No obvious ecchymosis/lesions  psych: Flat affect.  Not agitated.   Musculoskeletal: No obvious joint swelling/tenderness    Data Reviewed: I have personally reviewed following labs and imaging studies  CBC: Recent Labs  Lab 09/15/22 0719 09/16/22 0541 09/17/22 0604 09/18/22 0459 09/20/22 0616  WBC 18.1* 21.4* 17.8* 17.5* 10.4  HGB 9.7* 9.6* 9.2* 8.8* 8.5*  HCT 30.9* 31.3* 30.0* 28.3* 27.9*  MCV 82.8 83.9 84.7 83.5 86.9  PLT 405* 382 359 377 272   Basic Metabolic Panel: Recent Labs  Lab 09/15/22 0719 09/16/22 0541 09/17/22 0604 09/18/22 0459 09/20/22 0616  NA 139 141 148* 147* 145  K 4.6 4.7 4.4 3.5 3.5  CL 106 108 111 110 107  CO2 23 23 23 28 28   GLUCOSE 237* 276* 252* 190* 151*  BUN 85* 58* 42* 33* 21  CREATININE 1.88* 1.42* 1.34* 1.22* 1.21*  CALCIUM 8.0* 8.2* 8.3* 8.1* 7.7*  MG  --  2.2  --   --   --    GFR: Estimated Creatinine Clearance: 46.9 mL/min (A) (by C-G formula based on SCr of 1.21 mg/dL (H)). Liver Function Tests: No results for input(s): "AST", "ALT", "ALKPHOS", "BILITOT", "PROT", "ALBUMIN" in the last 168 hours.  No results for input(s): "LIPASE", "AMYLASE" in the last 168 hours. No results for input(s): "AMMONIA" in the last 168 hours. Coagulation Profile: No results for input(s): "INR", "PROTIME" in the last 168 hours. Cardiac Enzymes: Recent Labs  Lab 09/14/22 0815  CKTOTAL 116   BNP (last 3 results) No results for input(s): "PROBNP" in the last 8760 hours. HbA1C: No results for input(s): "HGBA1C" in the last 72 hours.  CBG: Recent Labs  Lab 09/19/22 0721 09/19/22 1125 09/19/22 1638 09/19/22 2135  09/20/22 0727  GLUCAP 144* 118* 139* 130* 160*   Lipid Profile: No results for input(s): "CHOL", "HDL", "LDLCALC", "TRIG", "CHOLHDL", "LDLDIRECT" in the last 72 hours. Thyroid Function Tests: No results for input(s): "TSH", "T4TOTAL", "FREET4", "T3FREE", "THYROIDAB" in the last 72 hours. Anemia Panel: No results for input(s): "VITAMINB12", "FOLATE", "FERRITIN", "TIBC", "IRON", "RETICCTPCT" in the last 72 hours. Sepsis Labs: No results for input(s): "PROCALCITON", "LATICACIDVEN" in the last 168  hours.   Recent Results (from the past 240 hour(s))  Aerobic/Anaerobic Culture w Gram Stain (surgical/deep wound)     Status: None   Collection Time: 09/12/22  3:05 PM   Specimen: Path Tissue  Result Value Ref Range Status   Specimen Description   Final    ABSCESS Performed at [facility], [address], [place], [place] [address]    Special Requests   Final    RIGHT GLUTEAL Performed at [facility], [address] [place], [place] [address]    Gram Stain NO WBC SEEN NO ORGANISMS SEEN   Final   Culture   Final    RARE CANDIDA ALBICANS RARE BACTEROIDES OVATUS/XYLANISOLVENS RARE BACTEROIDES FRAGILIS BETA LACTAMASE POSITIVE Performed at [facility], [address] [place], [place] [address]    Report Status 09/16/2022 FINAL  Final  Aerobic/Anaerobic Culture w Gram Stain (surgical/deep wound)     Status: None   Collection Time: 09/12/22  3:26 PM   Specimen: Buttocks; Abscess  Result Value Ref Range Status   Specimen Description   Final    ABSCESS Performed at [facility], [address], [place], [place] [address]    Special Requests   Final    R GLUTEAL Performed at [facility], [address], [place], [place] [address]    Gram Stain NO WBC SEEN NO ORGANISMS SEEN   Final   Culture   Final    RARE CANDIDA ALBICANS RARE BACTEROIDES OVATUS/XYLANISOLVENS RARE BACTEROIDES FRAGILIS BETA  LACTAMASE POSITIVE Performed at [facility], [address] [place], [place] [address]    Report Status 09/16/2022 FINAL  Final         Radiology Studies: No results found.      Scheduled Meds:  amantadine  100 mg Oral Daily   collagenase   Topical Daily   fluconazole  200 mg Oral Daily   heparin injection (subcutaneous)  5,000 Units Subcutaneous Q8H   hydrALAZINE  100 mg Oral TID   insulin aspart  0-20 Units Subcutaneous TID WC   insulin aspart  0-5 Units Subcutaneous QHS   insulin aspart  20 Units Subcutaneous TID WC   insulin glargine-yfgn  20 Units Subcutaneous BID   lidocaine  1 patch Transdermal Daily   metoprolol tartrate  100 mg Oral BID   polycarbophil  625 mg Oral BID   polyethylene glycol  17 g Oral BID   senna  1 tablet Oral QHS   Continuous Infusions:  ampicillin-sulbactam (UNASYN) IV 3 g (09/20/22 1131)          Glade Lloyd, MD [facility] 09/20/2022, 12:04 PM

## 2022-09-20 NOTE — Plan of Care (Signed)
  Problem: Coping: Goal: Ability to adjust to condition or change in health will improve Outcome: Progressing   Problem: Health Behavior/Discharge Planning: Goal: Ability to identify and utilize available resources and services will improve Outcome: Progressing   Problem: Skin Integrity: Goal: Risk for impaired skin integrity will decrease Outcome: Progressing   Problem: Tissue Perfusion: Goal: Adequacy of tissue perfusion will improve Outcome: Progressing   Problem: Education: Goal: Knowledge of General Education information will improve Description: Including pain rating scale, medication(s)/side effects and non-pharmacologic comfort measures Outcome: Progressing   Problem: Health Behavior/Discharge Planning: Goal: Ability to manage health-related needs will improve Outcome: Progressing   Problem: Clinical Measurements: Goal: Ability to maintain clinical measurements within normal limits will improve Outcome: Progressing   Problem: Pain Managment: Goal: General experience of comfort will improve Outcome: Progressing   Problem: Safety: Goal: Ability to remain free from injury will improve Outcome: Progressing   Problem: Skin Integrity: Goal: Risk for impaired skin integrity will decrease Outcome: Progressing   Problem: Metabolic: Goal: Ability to maintain appropriate glucose levels will improve Outcome: Not Progressing

## 2022-09-21 DIAGNOSIS — L0231 Cutaneous abscess of buttock: Secondary | ICD-10-CM | POA: Diagnosis not present

## 2022-09-21 DIAGNOSIS — N179 Acute kidney failure, unspecified: Secondary | ICD-10-CM | POA: Diagnosis not present

## 2022-09-21 LAB — GLUCOSE, CAPILLARY
Glucose-Capillary: 161 mg/dL — ABNORMAL HIGH (ref 70–99)
Glucose-Capillary: 165 mg/dL — ABNORMAL HIGH (ref 70–99)
Glucose-Capillary: 77 mg/dL (ref 70–99)
Glucose-Capillary: 113 mg/dL — ABNORMAL HIGH (ref 70–99)
Glucose-Capillary: 147 mg/dL — ABNORMAL HIGH (ref 70–99)

## 2022-09-21 MED ORDER — INSULIN GLARGINE-YFGN 100 UNIT/ML ~~LOC~~ SOLN
10.0000 [IU] | Freq: Two times a day (BID) | SUBCUTANEOUS | Status: DC
  Administered 2022-09-21 – 2022-09-22 (×2): 10 [IU] via SUBCUTANEOUS
  Filled 2022-09-21 (×3): qty 0.1

## 2022-09-21 MED ORDER — INSULIN ASPART 100 UNIT/ML IJ SOLN
5.0000 [IU] | Freq: Three times a day (TID) | INTRAMUSCULAR | Status: DC
  Administered 2022-09-21 – 2022-09-22 (×2): 5 [IU] via SUBCUTANEOUS

## 2022-09-21 NOTE — Progress Notes (Addendum)
Physical Therapy Treatment Patient Details Name: [name] [name] MRN: [id number] DOB: November 10, 1932 Today's Date: 09/21/2022   History of Present Illness Pt is an 89yo female admitted for cellutilis due to sacral wound and AKI, found to be COVID+ with severe weakness. PMH: DM, HTN, hx of CVA, HLD, CKD3.  CT abdomen and pelvis with right sacral wound extending from skin surface to the outer surface of gluteal musculature with significant gas collection.  General surgery also consulted and following.  Patient placed on IV fluids and antibiotics.  Wound care also following.  Patient being considered for debridement next week.    PT Comments  General Comments: increased alertness.  Speech is difficult to undersdtand at times.  Soft.  Weak.  Following all commands. Son present during session.  Assisted to EOB was very difficult.  General bed mobility comments: supine to sit to EOB required increased effort and time using + 2 assist pt no more that 10% self able.  No functioanl use R UIE which appear VERY edenamous.  Poor sitting balance.  Max posterior lean.  Poor self correction to midline.  Max c/o weakness/fatigue. General transfer comment: assisted OOB to recliner via "Bear Hug" 1/4 pivot.  Pt profoundly weak.  Unable to support her weight.  Performed another sit to stand due to BM.  Static standing ability < 30 seconds with full Therapist support. Rec Maxi Move for back to bed. Performed chair level TE's (see below).  Positioned to comfort.  Blue chair cushion. Pt is from ALF level but will need ST Rehab at SNF.      Assistance Recommended at Discharge Intermittent Supervision/Assistance  If plan is discharge home, recommend the following:  Can travel by private vehicle    A lot of help with walking and/or transfers;A lot of help with bathing/dressing/bathroom;Assistance with cooking/housework;Assist for transportation;Help with stairs or ramp for entrance   No  Equipment Recommendations  None  recommended by PT    Recommendations for Other Services       Precautions / Restrictions Precautions Precautions: Fall Precaution Comments: right sacral wound I&D 09/12/22, R hemiparesis from previous CVA, COVID Restrictions Weight Bearing Restrictions: No     Mobility  Bed Mobility Overal bed mobility: Needs Assistance Bed Mobility: Supine to Sit     Supine to sit: Total assist, +2 for physical assistance, +2 for safety/equipment     General bed mobility comments: supine to sit to EOB required increased effort and time using + 2 assist pt no more that 10% self able.  No functioanl use R UIE which appear VERY edenamous.  Poor sitting balance.  Max posterior lean.  Poor self correction to midline.  Max c/o weakness/fatigue.    Transfers Overall transfer level: Needs assistance   Transfers: Sit to/from Stand Sit to Stand: Max assist, +2 physical assistance, +2 safety/equipment Stand pivot transfers: +2 physical assistance, +2 safety/equipment, From elevated surface, Total assist, Max assist         General transfer comment: assisted OOB to recliner via "Bear Hug" 1/4 pivot.  Pt profoundly weak.  Unable to support her weight.  Performed another sit to stand due to BM.  Rec Maxi Move for back to bed.    Ambulation/Gait               General Gait Details: currently Non Amb   Stairs             Wheelchair Mobility     Tilt Bed    Modified Rankin (  Stroke Patients Only)       Balance                                            Cognition Arousal/Alertness: Awake/alert Behavior During Therapy: WFL for tasks assessed/performed Overall Cognitive Status: Within Functional Limits for tasks assessed                                 General Comments: increased alertness.  Speech is difficult to undersdtand at times.  Soft.  Weak.  Following all commands.        Exercises  Chair level TE's 5 reps X 2 LAQ's 5 reps x 2 Knee  ups 10 reps Towel squeezes 5 reps x 2 ABD sit ups    General Comments        Pertinent Vitals/Pain Pain Assessment Pain Assessment: Faces Faces Pain Scale: Hurts a little bit Pain Location: bottom (wound area) Pain Descriptors / Indicators: Discomfort, Grimacing Pain Intervention(s): Monitored during session    Home Living                          Prior Function            PT Goals (current goals can now be found in the care plan section)      Frequency    Min 1X/week      PT Plan Current plan remains appropriate    Co-evaluation              AM-PAC PT "6 Clicks" Mobility   Outcome Measure  Help needed turning from your back to your side while in a flat bed without using bedrails?: Total Help needed moving from lying on your back to sitting on the side of a flat bed without using bedrails?: Total Help needed moving to and from a bed to a chair (including a wheelchair)?: Total Help needed standing up from a chair using your arms (e.g., wheelchair or bedside chair)?: Total Help needed to walk in hospital room?: Total Help needed climbing 3-5 steps with a railing? : Total 6 Click Score: 6    End of Session Equipment Utilized During Treatment: Gait belt Activity Tolerance: Treatment limited secondary to medical complications (Comment) Patient left: in chair Nurse Communication: Mobility status;Need for lift equipment PT Visit Diagnosis: Other abnormalities of gait and mobility (R26.89);Muscle weakness (generalized) (M62.81)     Time: 9528-4132 PT Time Calculation (min) (ACUTE ONLY): 27 min  Charges:    $Therapeutic Exercise: 8-22 mins $Therapeutic Activity: 8-22 mins PT General Charges $$ ACUTE PT VISIT: 1 Visit                     Felecia Shelling  PTA Acute  Rehabilitation Services Office M-F          [phone]

## 2022-09-21 NOTE — Care Management Important Message (Signed)
Important Message  Patient Details IM Letter given. Name: [name] [name] MRN: [id number] Date of Birth: Aug 15, 1932   Medicare Important Message Given:  Yes     Caren Macadam 09/21/2022, 2:15 PM

## 2022-09-21 NOTE — Plan of Care (Signed)

## 2022-09-21 NOTE — Progress Notes (Addendum)
   09/21/22 1600  SLP Visit Information  SLP Received On 09/21/22  Reason Eval/Treat Not Completed Other (comment) (pt working with staff at this time, getting transferred using lift and getting cleaned up, will continue efforts -)   [name] Infante, MS [facility] SLP Acute Rehab Services Office [phone]

## 2022-09-21 NOTE — Discharge Instructions (Signed)
Wet to Dry WOUND CARE: - Change dressing twice daily - Supplies: sterile saline, kerlex, scissors, ABD pads, tape , Santyl Remove dressing and all packing carefully, moistening with sterile saline as needed to avoid packing/internal dressing sticking to the wound. 2.   Clean edges of skin around the wound with water/gauze, making sure there is no tape debris or leakage left on skin that could cause skin irritation or breakdown. 3.   Apply Santyl to areas of fibrinous tissue. Dampen and clean kerlex with sterile saline and pack wound from wound base to skin level, making sure to take note of any possible areas of wound tracking, tunneling and packing appropriately. Wound can be packed loosely. Trim kerlex to size if a whole kerlex is not required. 4.   Cover wound with a dry ABD pad and secure with tape.  5.   Write the date/time on the dry dressing/tape to better track when the last dressing change occurred. - apply any skin protectant/powder if recommended by clinician to protect skin/skin folds. - change dressing as needed if leakage occurs, wound gets contaminated, or patient requests to shower. - You may shower daily with wound open and following the shower the wound should be dried and a clean dressing placed.  - Medical grade tape as well as packing supplies can be found at [employer] on [address] or [employer] on [address]. The remaining supplies can be found at your local drug store, [employer] etc.

## 2022-09-21 NOTE — Plan of Care (Signed)
  Problem: Coping: Goal: Ability to adjust to condition or change in health will improve Outcome: Progressing   Problem: Metabolic: Goal: Ability to maintain appropriate glucose levels will improve Outcome: Progressing   Problem: Nutritional: Goal: Maintenance of adequate nutrition will improve Outcome: Progressing   Problem: Skin Integrity: Goal: Risk for impaired skin integrity will decrease Outcome: Progressing   Problem: Tissue Perfusion: Goal: Adequacy of tissue perfusion will improve Outcome: Progressing   Problem: Clinical Measurements: Goal: Ability to maintain clinical measurements within normal limits will improve Outcome: Progressing Goal: Will remain free from infection Outcome: Progressing   Problem: Nutrition: Goal: Adequate nutrition will be maintained Outcome: Progressing   Problem: Coping: Goal: Level of anxiety will decrease Outcome: Progressing   Problem: Pain Managment: Goal: General experience of comfort will improve Outcome: Progressing   Problem: Skin Integrity: Goal: Risk for impaired skin integrity will decrease Outcome: Progressing

## 2022-09-21 NOTE — Progress Notes (Signed)
Patient ID: [name] [name], female   DOB: 1933/01/02, 89 y.o.   MRN: [id number] [facility] Progress Note  9 Days Post-Op  Subjective: CC-  Daughter at bedside. Pain improving.  Objective: Vital signs in last 24 hours: Temp:  [98 F (36.7 C)-98.2 F (36.8 C)] 98.1 F (36.7 C) (07/25 1301) Pulse Rate:  [61-73] 71 (07/25 1301) Resp:  [16-18] 18 (07/25 1301) BP: (141-179)/(35-104) 157/104 (07/25 1301) SpO2:  [93 %-95 %] 93 % (07/25 1310) Last BM Date : 09/20/22  Intake/Output from previous day: 07/24 0701 - 07/25 0700 In: 520 [P.O.:520] Out: -  Intake/Output this shift: Total I/O In: 360 [P.O.:360] Out: -   PE: General: WD, obese female who is laying in bed in NAD Lungs: Respiratory effort nonlabored GU: right gluteal wound pictured below with some improvement of fibrinous exudate and slight increase in granulation tissue in base, no purulent drainage noted and no cellulitis, counter incisions without cellulitis or drainage   Lab Results:  Recent Labs    09/20/22 0616  WBC 10.4  HGB 8.5*  HCT 27.9*  PLT 272   BMET Recent Labs    09/20/22 0616  NA 145  K 3.5  CL 107  CO2 28  GLUCOSE 151*  BUN 21  CREATININE 1.21*  CALCIUM 7.7*   PT/INR No results for input(s): "LABPROT", "INR" in the last 72 hours. CMP     Component Value Date/Time   NA 145 09/20/2022 0616   K 3.5 09/20/2022 0616   CL 107 09/20/2022 0616   CO2 28 09/20/2022 0616   GLUCOSE 151 (H) 09/20/2022 0616   BUN 21 09/20/2022 0616   CREATININE 1.21 (H) 09/20/2022 0616   CALCIUM 7.7 (L) 09/20/2022 0616   PROT 6.0 (L) 09/13/2022 0537   ALBUMIN 2.8 (L) 09/13/2022 0537   AST 28 09/13/2022 0537   ALT 34 09/13/2022 0537   ALKPHOS 59 09/13/2022 0537   BILITOT 0.7 09/13/2022 0537   GFRNONAA 43 (L) 09/20/2022 0616   GFRAA 39 (L) 06/11/2019 1436   Lipase     Component Value Date/Time   LIPASE 21 06/11/2019 1436       Studies/Results: No results  found.  Anti-infectives: Anti-infectives (From admission, onward)    Start     Dose/Rate Route Frequency Ordered Stop   09/20/22 1000  fluconazole (DIFLUCAN) tablet 200 mg        200 mg Oral Daily 09/20/22 0820 09/28/22 0959   09/18/22 1230  Ampicillin-Sulbactam (UNASYN) 3 g in sodium chloride 0.9 % 100 mL IVPB  Status:  Discontinued        3 g 200 mL/hr over 30 Minutes Intravenous Every 6 hours 09/18/22 1139 09/20/22 1257   09/16/22 2200  piperacillin-tazobactam (ZOSYN) IVPB 3.375 g  Status:  Discontinued        3.375 g 12.5 mL/hr over 240 Minutes Intravenous Every 8 hours 09/16/22 1345 09/18/22 1139   09/16/22 1400  DAPTOmycin (CUBICIN) 600 mg in sodium chloride 0.9 % IVPB  Status:  Discontinued        8 mg/kg  76.1 kg (Adjusted) 124 mL/hr over 30 Minutes Intravenous Daily 09/16/22 1042 09/18/22 1139   09/16/22 1100  ceFEPIme (MAXIPIME) 2 g in sodium chloride 0.9 % 100 mL IVPB  Status:  Discontinued        2 g 200 mL/hr over 30 Minutes Intravenous Every 12 hours 09/16/22 1042 09/16/22 1333   09/15/22 1400  DAPTOmycin (CUBICIN) 600 mg in sodium chloride 0.9 % IVPB  Status:  Discontinued        8 mg/kg  76.1 kg (Adjusted) 124 mL/hr over 30 Minutes Intravenous Every 48 hours 09/13/22 0857 09/16/22 1042   09/15/22 1000  fluconazole (DIFLUCAN) IVPB 200 mg  Status:  Discontinued       Placed in "Followed by" Linked Group   200 mg 100 mL/hr over 60 Minutes Intravenous Daily 09/14/22 1354 09/20/22 0820   09/14/22 1445  fluconazole (DIFLUCAN) IVPB 400 mg       Placed in "Followed by" Linked Group   400 mg 100 mL/hr over 120 Minutes Intravenous  Once 09/14/22 1354 09/14/22 1710   09/13/22 1000  DAPTOmycin (CUBICIN) 600 mg in sodium chloride 0.9 % IVPB        8 mg/kg  76.1 kg (Adjusted) 124 mL/hr over 30 Minutes Intravenous Daily 09/13/22 0850 09/13/22 1435   09/12/22 1105  vancomycin variable dose per unstable renal function (pharmacist dosing)  Status:  Discontinued         Does not  apply See admin instructions 09/12/22 1105 09/13/22 0850   09/11/22 0000  vancomycin (VANCOREADY) IVPB 750 mg/150 mL  Status:  Discontinued        750 mg 150 mL/hr over 60 Minutes Intravenous Every 48 hours 09/09/22 0010 09/12/22 1104   09/09/22 1200  ceFEPIme (MAXIPIME) 2 g in sodium chloride 0.9 % 100 mL IVPB  Status:  Discontinued        2 g 200 mL/hr over 30 Minutes Intravenous Every 24 hours 09/09/22 0010 09/16/22 1042   09/09/22 1000  metroNIDAZOLE (FLAGYL) IVPB 500 mg  Status:  Discontinued        500 mg 100 mL/hr over 60 Minutes Intravenous Every 12 hours 09/09/22 0737 09/16/22 1345   09/09/22 0045  vancomycin (VANCOREADY) IVPB 2000 mg/400 mL        2,000 mg 200 mL/hr over 120 Minutes Intravenous  Once 09/08/22 2358 09/09/22 0252   09/08/22 1345  piperacillin-tazobactam (ZOSYN) IVPB 3.375 g        3.375 g 100 mL/hr over 30 Minutes Intravenous  Once 09/08/22 1336 09/08/22 1444        Assessment/Plan Stage III gluteal pressure wound S/P I&D 7/16 Dr. Michaell Cowing  - wound stable/slowly improving. No indication for further surgical debridement - continue BID dressing changes (santyl with wet to dry) to largest portion of wound, PRN if soiled with BM - pressure redistribution as able - cultures with rare candida, rare bacteroides - no further abx needed from surgical standpoint - Plan for one more session of hydrotherapy in the morning, then discharging to SNF. Patient needs follow up with the wound center.  - updated her daughter at the bedside   - we will sign off, call with questions or concerns  FEN: D2 diet  VTE: SQH ID: unasyn, diflucan    - per primary -  COVID+ - diagnosed 09/06/22 MRSA bacteremia  AKI on CKD stage IIIb Anemia of chronic disease HTN T2DM Physical deconditioning     LOS: 13 days    Franne Forts, PA-C [facility] 09/21/2022, 2:00 PM Please see Amion for pager number during day hours 7:00am-4:30pm

## 2022-09-21 NOTE — Progress Notes (Signed)
PROGRESS NOTE    [name] [name]  RUE:[id number] DOB: 01-15-1933 DOA: 09/08/2022 PCP: [name], MD   Brief Narrative:  89 y.o. female with medical history significant of hypertension, diabetes mellitus type 2, prior stroke, pancreatitis, hyperlipidemia, CKD stage IIIb, obesity, chronic back wound presented with worsening weakness, lethargy and back pain.  Patient was apparently diagnosed with COVID-19 on 09/06/2022 at her assisted living facility and was started on Paxlovid and was also started on Bactrim for diagnosis of cellulitis of the back.  On presentation, WBCs was 18.5, creatinine was 3.24.  CT of abdomen and pelvis without contrast showed right sacral wound extending from the skin surface to the outer surface of the gluteal musculature with significant gas collection.  She was started on IV fluids and antibiotics.  General surgery was consulted.  Palliative care also consulted for goals of care discussion.  Nephrology was consulted for AKI.  She underwent I&D and extensive debridement on 09/12/2022 by general surgery.  She had issues with encephalopathy: CODE STATUS switched to DNR.  Kidney function has improved and nephrology has signed off.  Encephalopathy improving.  PT recommending SNF placement.  Assessment & Plan:   Right posterior gluteal decubitus abscess with wet gangrene and necrosis Methicillin-resistant coagulase-negative Staphylococcus bacteremia: Possibly contaminant -Status post incision and drainage with extensive debridement 7/16 per general surgery. Blood culture Staphylococcus epidermidis in 1 set. Contaminant. Repeat blood cultures from 09/10/2022 no growth, final  -Wound cultures: rare Candida, bacteroides ovatus, fragilis; beta lactamase positive.  -Continue wound care as per general surgery recommendations.  General surgery recommended to stop antibiotics on 09/18/2022. -DC'd Unasyn on 09/20/2022  Leukocytosis -Resolved  Acute kidney injury on chronic kidney  disease stage IIIb Acute metabolic acidosis: Resolved -Creatinine around 1.4-1.8.  Creatinine 3.04 on presentation.  Peaked to 3.57 during this hospitalization.  Renal ultrasound was negative for hydronephrosis. -Nephrology evaluated the patient and has subsequently signed off. -Renal function back to baseline; creatinine 1.21 on 09/20/2022  Acute delirium/acute metabolic and toxic encephalopathy -Patient was in encephalopathic for few days but mental status is gradually improving.  CT head on 09/16/2022 negative. -Continue supportive care.  Fall precautions.  Delirium precautions.  Dysphagia Oral candidiasis -Improving.  Continue Diflucan.  Diet as per SLP recommendations: Dysphagia 3.  Recent diagnosis of COVID-19 -Diagnosed on 09/06/2022.  Completed isolation and is currently off of isolation.  Hypernatremia -Improved.  Monitor.  Anemia of chronic disease -From chronic illnesses.  Hemoglobin stable.  Monitor intermittently.    Mildly elevated AST -Resolved  Hypertension -Monitor blood pressure.  Continue hydralazine and metoprolol.  Holding losartan, chlorthalidone and Lasix.    Diabetes mellitus type 2 with hyperglycemia and hypoglycemia -A1c 9.8.  Continue CBGs with SSI.  Decrease long-acting insulin and NovoLog with meals.  Blood sugars intermittently on the lower side.[name] on hold  Physical deconditioning -PT recommending SNF placement.  TOC following.  Goals of care -CODE STATUS has been changed to DNR during this hospitalization.  Might benefit from outpatient palliative care follow-up  Past medical history of stroke with right upper extremity and right lower extremity hemiparesis  Morbid obesity -Outpatient follow-up  DVT prophylaxis: heparin subcutaneous  code Status: DNR Family Communication: Daughter at bedside Disposition Plan: Status is: Inpatient Remains inpatient appropriate because: Of severity of illness.  Need for SNF patient.    Consultants:  General surgery.  palliative care.  Nephrology  Procedures: None  Antimicrobials:  Anti-infectives (From admission, onward)    Start     Dose/Rate Route  Frequency Ordered Stop   09/20/22 1000  fluconazole (DIFLUCAN) tablet 200 mg        200 mg Oral Daily 09/20/22 0820 09/28/22 0959   09/18/22 1230  Ampicillin-Sulbactam (UNASYN) 3 g in sodium chloride 0.9 % 100 mL IVPB  Status:  Discontinued        3 g 200 mL/hr over 30 Minutes Intravenous Every 6 hours 09/18/22 1139 09/20/22 1257   09/16/22 2200  piperacillin-tazobactam (ZOSYN) IVPB 3.375 g  Status:  Discontinued        3.375 g 12.5 mL/hr over 240 Minutes Intravenous Every 8 hours 09/16/22 1345 09/18/22 1139   09/16/22 1400  DAPTOmycin (CUBICIN) 600 mg in sodium chloride 0.9 % IVPB  Status:  Discontinued        8 mg/kg  76.1 kg (Adjusted) 124 mL/hr over 30 Minutes Intravenous Daily 09/16/22 1042 09/18/22 1139   09/16/22 1100  ceFEPIme (MAXIPIME) 2 g in sodium chloride 0.9 % 100 mL IVPB  Status:  Discontinued        2 g 200 mL/hr over 30 Minutes Intravenous Every 12 hours 09/16/22 1042 09/16/22 1333   09/15/22 1400  DAPTOmycin (CUBICIN) 600 mg in sodium chloride 0.9 % IVPB  Status:  Discontinued        8 mg/kg  76.1 kg (Adjusted) 124 mL/hr over 30 Minutes Intravenous Every 48 hours 09/13/22 0857 09/16/22 1042   09/15/22 1000  fluconazole (DIFLUCAN) IVPB 200 mg  Status:  Discontinued       Placed in "Followed by" Linked Group   200 mg 100 mL/hr over 60 Minutes Intravenous Daily 09/14/22 1354 09/20/22 0820   09/14/22 1445  fluconazole (DIFLUCAN) IVPB 400 mg       Placed in "Followed by" Linked Group   400 mg 100 mL/hr over 120 Minutes Intravenous  Once 09/14/22 1354 09/14/22 1710   09/13/22 1000  DAPTOmycin (CUBICIN) 600 mg in sodium chloride 0.9 % IVPB        8 mg/kg  76.1 kg (Adjusted) 124 mL/hr over 30 Minutes Intravenous Daily 09/13/22 0850 09/13/22 1435   09/12/22 1105  vancomycin variable dose per unstable renal function  (pharmacist dosing)  Status:  Discontinued         Does not apply See admin instructions 09/12/22 1105 09/13/22 0850   09/11/22 0000  vancomycin (VANCOREADY) IVPB 750 mg/150 mL  Status:  Discontinued        750 mg 150 mL/hr over 60 Minutes Intravenous Every 48 hours 09/09/22 0010 09/12/22 1104   09/09/22 1200  ceFEPIme (MAXIPIME) 2 g in sodium chloride 0.9 % 100 mL IVPB  Status:  Discontinued        2 g 200 mL/hr over 30 Minutes Intravenous Every 24 hours 09/09/22 0010 09/16/22 1042   09/09/22 1000  metroNIDAZOLE (FLAGYL) IVPB 500 mg  Status:  Discontinued        500 mg 100 mL/hr over 60 Minutes Intravenous Every 12 hours 09/09/22 0737 09/16/22 1345   09/09/22 0045  vancomycin (VANCOREADY) IVPB 2000 mg/400 mL        2,000 mg 200 mL/hr over 120 Minutes Intravenous  Once 09/08/22 2358 09/09/22 0252   09/08/22 1345  piperacillin-tazobactam (ZOSYN) IVPB 3.375 g        3.375 g 100 mL/hr over 30 Minutes Intravenous  Once 09/08/22 1336 09/08/22 1444        Subjective: Patient seen and examined at bedside.  No seizures, agitation, fever or vomiting reported.  Feels slightly better.  Objective: Vitals:   09/20/22 1812 09/20/22 1929 09/20/22 2133 09/21/22 0634  BP: (!) 141/35 (!) 150/63 (!) 178/62 (!) 177/59  Pulse: 70 73 61 65  Resp:  16  16  Temp:  98 F (36.7 C)  98.2 F (36.8 C)  TempSrc:      SpO2:  94%  93%  Weight:      Height:        Intake/Output Summary (Last 24 hours) at 09/21/2022 0738 Last data filed at 09/20/2022 2129 Gross per 24 hour  Intake 520 ml  Output --  Net 520 ml   Filed Weights   09/17/22 0500 09/18/22 0525 09/20/22 0500  Weight: 135.2 kg 136 kg (!) 137.9 kg    Examination:  General: Currently on room air.  No acute distress currently.  Chronically ill and deconditioned looking.  Elderly female lying in bed. ENT/neck: No thyromegaly.  JVD is not elevated  respiratory: Bilateral decreased breath sounds at bases with scattered crackles  CVS: S1-S2  heard, rate controlled currently Abdominal: Soft, morbidly obese, nontender, slightly distended; no organomegaly, bowel sounds normally heard Extremities: Trace lower extremity edema; no clubbing CNS: Alert; still very slow to respond but answers some questions.  No focal neurologic deficit.  Moves extremities Lymph: No obvious lymphadenopathy Skin: No obvious rashes/petechiae psych: Not agitated currently.  Mostly flat affect. musculoskeletal: No obvious joint erythema/deformity     Data Reviewed: I have personally reviewed following labs and imaging studies  CBC: Recent Labs  Lab 09/15/22 0719 09/16/22 0541 09/17/22 0604 09/18/22 0459 09/20/22 0616  WBC 18.1* 21.4* 17.8* 17.5* 10.4  HGB 9.7* 9.6* 9.2* 8.8* 8.5*  HCT 30.9* 31.3* 30.0* 28.3* 27.9*  MCV 82.8 83.9 84.7 83.5 86.9  PLT 405* 382 359 377 272   Basic Metabolic Panel: Recent Labs  Lab 09/15/22 0719 09/16/22 0541 09/17/22 0604 09/18/22 0459 09/20/22 0616  NA 139 141 148* 147* 145  K 4.6 4.7 4.4 3.5 3.5  CL 106 108 111 110 107  CO2 23 23 23 28 28   GLUCOSE 237* 276* 252* 190* 151*  BUN 85* 58* 42* 33* 21  CREATININE 1.88* 1.42* 1.34* 1.22* 1.21*  CALCIUM 8.0* 8.2* 8.3* 8.1* 7.7*  MG  --  2.2  --   --   --    GFR: Estimated Creatinine Clearance: 46.9 mL/min (A) (by C-G formula based on SCr of 1.21 mg/dL (H)). Liver Function Tests: No results for input(s): "AST", "ALT", "ALKPHOS", "BILITOT", "PROT", "ALBUMIN" in the last 168 hours.  No results for input(s): "LIPASE", "AMYLASE" in the last 168 hours. No results for input(s): "AMMONIA" in the last 168 hours. Coagulation Profile: No results for input(s): "INR", "PROTIME" in the last 168 hours. Cardiac Enzymes: Recent Labs  Lab 09/14/22 0815  CKTOTAL 116   BNP (last 3 results) No results for input(s): "PROBNP" in the last 8760 hours. HbA1C: No results for input(s): "HGBA1C" in the last 72 hours.  CBG: Recent Labs  Lab 09/20/22 1205 09/20/22 1318  09/20/22 1713 09/20/22 1741 09/20/22 2111  GLUCAP 176* 176* 53* 81 135*   Lipid Profile: No results for input(s): "CHOL", "HDL", "LDLCALC", "TRIG", "CHOLHDL", "LDLDIRECT" in the last 72 hours. Thyroid Function Tests: No results for input(s): "TSH", "T4TOTAL", "FREET4", "T3FREE", "THYROIDAB" in the last 72 hours. Anemia Panel: No results for input(s): "VITAMINB12", "FOLATE", "FERRITIN", "TIBC", "IRON", "RETICCTPCT" in the last 72 hours. Sepsis Labs: No results for input(s): "PROCALCITON", "LATICACIDVEN" in the last 168 hours.   Recent Results (from the past  240 hour(s))  Aerobic/Anaerobic Culture w Gram Stain (surgical/deep wound)     Status: None   Collection Time: 09/12/22  3:05 PM   Specimen: Path Tissue  Result Value Ref Range Status   Specimen Description   Final    ABSCESS Performed at [facility], [address] [place], [place] [address]    Special Requests   Final    RIGHT GLUTEAL Performed at [facility], [address] [place], [place] [address]    Gram Stain NO WBC SEEN NO ORGANISMS SEEN   Final   Culture   Final    RARE CANDIDA ALBICANS RARE BACTEROIDES OVATUS/XYLANISOLVENS RARE BACTEROIDES FRAGILIS BETA LACTAMASE POSITIVE Performed at [facility], [address] [place], [place] [address]    Report Status 09/16/2022 FINAL  Final  Aerobic/Anaerobic Culture w Gram Stain (surgical/deep wound)     Status: None   Collection Time: 09/12/22  3:26 PM   Specimen: Buttocks; Abscess  Result Value Ref Range Status   Specimen Description   Final    ABSCESS Performed at [facility], [address], [place], [place] [address]    Special Requests   Final    R GLUTEAL Performed at [facility], [address], [place], [place] [address]    Gram Stain NO WBC SEEN NO ORGANISMS SEEN   Final   Culture   Final    RARE CANDIDA ALBICANS RARE BACTEROIDES OVATUS/XYLANISOLVENS RARE  BACTEROIDES FRAGILIS BETA LACTAMASE POSITIVE Performed at [facility], [address], [place], [place] [address]    Report Status 09/16/2022 FINAL  Final         Radiology Studies: No results found.      Scheduled Meds:  amantadine  100 mg Oral Daily   collagenase   Topical Daily   fluconazole  200 mg Oral Daily   heparin injection (subcutaneous)  5,000 Units Subcutaneous Q8H   hydrALAZINE  100 mg Oral TID   insulin aspart  0-20 Units Subcutaneous TID WC   insulin aspart  0-5 Units Subcutaneous QHS   insulin aspart  20 Units Subcutaneous TID WC   insulin glargine-yfgn  20 Units Subcutaneous BID   lidocaine  1 patch Transdermal Daily   metoprolol tartrate  100 mg Oral BID   polycarbophil  625 mg Oral BID   polyethylene glycol  17 g Oral BID   senna  1 tablet Oral QHS   Continuous Infusions:          Glade Lloyd, MD [facility] 09/21/2022, 7:38 AM

## 2022-09-22 DIAGNOSIS — L0231 Cutaneous abscess of buttock: Secondary | ICD-10-CM | POA: Diagnosis not present

## 2022-09-22 LAB — GLUCOSE, CAPILLARY
Glucose-Capillary: 128 mg/dL — ABNORMAL HIGH (ref 70–99)
Glucose-Capillary: 142 mg/dL — ABNORMAL HIGH (ref 70–99)

## 2022-09-22 MED ORDER — FLUCONAZOLE 200 MG PO TABS: 200.0000 mg | ORAL_TABLET | Freq: Every day | ORAL | 0 refills | Status: DC

## 2022-09-22 MED ORDER — COLLAGENASE 250 UNIT/GM EX OINT: TOPICAL_OINTMENT | Freq: Every day | CUTANEOUS | Status: DC

## 2022-09-22 MED ORDER — LANTUS SOLOSTAR 100 UNIT/ML ~~LOC~~ SOPN: 10.0000 [IU] | PEN_INJECTOR | Freq: Two times a day (BID) | SUBCUTANEOUS | Status: AC

## 2022-09-22 MED ORDER — POLYETHYLENE GLYCOL 3350 17 G PO PACK: 17.0000 g | PACK | Freq: Two times a day (BID) | ORAL | Status: AC | PRN

## 2022-09-22 NOTE — Discharge Summary (Signed)
Physician Discharge Summary  KERBI DUGUE GMW:[id number] DOB: [birth year] DOA: 09/08/2022  PCP: [name], MD  Admit date: 09/08/2022 Discharge date: 09/22/2022  Admitted From: [facility] Disposition: SNF  Recommendations for Outpatient Follow-up:  Follow up with SNF provider at earliest convenience with repeat CBC/BMP in the next few days Outpatient follow-up with wound care center.  Outpatient follow-up with general surgery if needed.  Discharge wound care and pain management as per general surgery Outpatient follow-up with nephrology Recommend outpatient evaluation and follow-up by palliative care if condition worsens Follow up in ED if symptoms worsen or new appear   Home Health: No Equipment/Devices: None  Discharge Condition: Guarded CODE STATUS: DNR Diet recommendation: Heart healthy/carb modified/diet as per SLP recommendations  Brief/Interim Summary: 89 y.o. female with medical history significant of hypertension, diabetes mellitus type 2, prior stroke, pancreatitis, hyperlipidemia, CKD stage IIIb, obesity, chronic back wound presented with worsening weakness, lethargy and back pain.  Patient was apparently diagnosed with COVID-19 on 09/06/2022 at her assisted living facility and was started on Paxlovid and was also started on Bactrim for diagnosis of cellulitis of the back.  On presentation, WBCs was 18.5, creatinine was 3.24.  CT of abdomen and pelvis without contrast showed right sacral wound extending from the skin surface to the outer surface of the gluteal musculature with significant gas collection.  She was started on IV fluids and antibiotics.  General surgery was consulted.  Palliative care also consulted for goals of care discussion.  Nephrology was consulted for AKI.  She underwent I&D and extensive debridement on 09/12/2022 by general surgery.  She had issues with encephalopathy: CODE STATUS switched to DNR.  Kidney function has improved and nephrology has signed off.   Encephalopathy improving.  PT recommending SNF placement.  She will be discharged to SNF once bed is available.  Recommend outpatient follow-up with palliative care if condition does not improve.  Discharge Diagnoses:   Right posterior gluteal decubitus abscess with wet gangrene and necrosis Methicillin-resistant coagulase-negative Staphylococcus bacteremia: Possibly contaminant -Status post incision and drainage with extensive debridement 7/16 per general surgery. Blood culture Staphylococcus epidermidis in 1 set. Contaminant. Repeat blood cultures from 09/10/2022 no growth, final  -Wound cultures: rare Candida, bacteroides ovatus, fragilis; beta lactamase positive.  -Continue wound care as per general surgery recommendations.  General surgery recommended to stop antibiotics on 09/18/2022. -DC'd Unasyn on 09/20/2022 -Patient also had hydrotherapy during this hospitalization.  General surgery has cleared the patient for discharge to SNF today after 1 more session of hydrotherapy today.  General surgery recommends outpatient follow-up with wound center.  Discharge wound care and pain management as per general surgery recommendations. -Discharge patient to SNF today.   Leukocytosis -Resolved   Acute kidney injury on chronic kidney disease stage IIIb Acute metabolic acidosis: Resolved -Creatinine around 1.4-1.8.  Creatinine 3.04 on presentation.  Peaked to 3.57 during this hospitalization.  Renal ultrasound was negative for hydronephrosis. -Nephrology evaluated the patient and has subsequently signed off. -Renal function back to baseline; creatinine 1.21 on 09/20/2022.  Monitor intermittently as an outpatient.   Acute delirium/acute metabolic and toxic encephalopathy -Patient was in encephalopathic for few days but mental status is gradually improving.  CT head on 09/16/2022 negative. -Continue supportive care.  Fall precautions.  Delirium precautions.   Dysphagia Oral candidiasis -Improving.   Continue Diflucan.  Diet as per SLP recommendations: Dysphagia 3.  Discharge patient on 5 more days of oral Diflucan.   Recent diagnosis of COVID-19 -Diagnosed on 09/06/2022.  Completed isolation  and is currently off of isolation.   Hypernatremia -Improved.  Monitor intermittently as an outpatient.   Anemia of chronic disease -From chronic illnesses.  Hemoglobin stable.  Monitor intermittently intermittently as an outpatient.     Mildly elevated AST -Resolved   Hypertension -Monitor blood pressure.  Continue hydralazine and metoprolol.  Holding losartan, chlorthalidone and Lasix.  Outpatient follow-up.   Diabetes mellitus type 2 with hyperglycemia and hypoglycemia -A1c 9.8.  Carb modified diet.  Blood sugars intermittently on the lower side.  Continue decreased dose of long-acting insulin on discharge. [name] on hold   Physical deconditioning -PT recommending SNF placement.     Goals of care -CODE STATUS has been changed to DNR during this hospitalization.  Might benefit from outpatient palliative care follow-up   Past medical history of stroke with right upper extremity and right lower extremity hemiparesis   Morbid obesity -Outpatient follow-up  Discharge Instructions  Discharge Instructions     AMB referral to wound care center   Complete by: As directed    Diet - low sodium heart healthy   Complete by: As directed    Diet Carb Modified   Complete by: As directed    Discharge wound care:   Complete by: As directed    As per General surgery recommendations   Increase activity slowly   Complete by: As directed       Allergies as of 09/22/2022       Reactions   Amlodipine Other (See Comments)   EDEMA and "Allergic," per MAR   Ace Inhibitors Other (See Comments), Cough   "Allergic," per [facility]   Atorvastatin Other (See Comments)   Muscle & joint aches and "Allergic," per [facility]   Canagliflozin Other (See Comments)   INVOKANA = urinary tract infections and  "Allergic," per [facility]   Clonidine Hcl Other (See Comments)   "Allergic," per [facility]   Buprenorphine Other (See Comments)   "Allergic," per [facility]   Lisinopril Other (See Comments)   "Allergic," per [facility]   Morphine And Codeine Rash, Other (See Comments)   For long periods of time (??) and "Allergic," per [facility]        Medication List     STOP taking these medications    Accu-Chek Guide test strip Generic drug: glucose blood   benzonatate 100 MG capsule Commonly known as: TESSALON   chlorthalidone 25 MG tablet Commonly known as: HYGROTON   First Aid Antibiotic 4 % Oint   furosemide 20 MG tablet Commonly known as: LASIX   insulin lispro 100 UNIT/ML KwikPen Commonly known as: HUMALOG   magnesium oxide 400 MG tablet Commonly known as: MAG-OX   PAXLOVID (150/100) PO   potassium chloride SA 20 MEQ tablet Commonly known as: KLOR-CON M   sulfamethoxazole-trimethoprim 800-160 MG tablet Commonly known as: BACTRIM DS       TAKE these medications    Acetaminophen 500 MG capsule Take 500 mg by mouth every 6 (six) hours as needed (for mild pain).   albuterol 108 (90 Base) MCG/ACT inhaler Commonly known as: VENTOLIN HFA Inhale 2 puffs into the lungs every 6 (six) hours as needed for wheezing or shortness of breath.   amantadine 100 MG capsule Commonly known as: SYMMETREL Take 1 capsule (100 mg total) by mouth daily.   Aspercreme Lidocaine 4 % Crea Generic drug: Lidocaine HCl Apply 1 application  topically every 6 (six) hours as needed (for pain- right knee).   ASPIRIN 81 PO Take 81 mg by mouth in  the morning.   atorvastatin 40 MG tablet Commonly known as: Lipitor Take 1 tablet (40 mg total) by mouth daily.   BLUE-EMU SUPER STRENGTH EX Apply 1 application  topically every 6 (six) hours as needed (for pain- right knee).   collagenase 250 UNIT/GM ointment Commonly known as: SANTYL Apply topically daily. Apply to right gluteal wound base   Dexcom G6 Receiver Devi 1  each by Does not apply route daily.   Dexcom G6 Sensor Misc 1 each by Does not apply route daily. What changed:  how much to take how to take this when to take this additional instructions   Dexcom G6 Transmitter Misc 1 each by Does not apply route daily.   ASPERCREME ARTHRITIS PAIN EX Apply 1 application  topically every 12 (twelve) hours as needed (for pain- affected area of the left arm). What changed: Another medication with the same name was changed. Make sure you understand how and when to take each.   diclofenac Sodium 1 % Gel Commonly known as: Voltaren Arthritis Pain Apply 2 g topically 4 (four) times daily. What changed:  how much to take when to take this additional instructions   DropSafe Safety Pen Needles 31G X 6 MM Misc Generic drug: Insulin Pen Needle   fluconazole 200 MG tablet Commonly known as: DIFLUCAN Take 1 tablet (200 mg total) by mouth daily. Start taking on: September 23, 2022   hydrALAZINE 100 MG tablet Commonly known as: APRESOLINE Take 1 tablet (100 mg total) by mouth 3 (three) times daily.   Insulin Syringe-Needle U-100 31G X 5/16" 1 ML Misc 1 application by Does not apply route 2 (two) times daily.   Lantus SoloStar 100 UNIT/ML Solostar Pen Generic drug: insulin glargine Inject 10 Units into the skin 2 (two) times daily. What changed:  how much to take when to take this   lidocaine 5 % Commonly known as: LIDODERM APPLY (1) PATCH TO LOWER BACK AND RIGHT KNEE ONCE DAILY. LEAVE ON 12 HOURS. LEAVE OFF 12 HOURS. What changed: See the new instructions.   loratadine 10 MG tablet Commonly known as: CLARITIN Take 10 mg by mouth in the morning.   metoprolol tartrate 100 MG tablet Commonly known as: LOPRESSOR Take 1 tablet (100 mg total) by mouth 2 (two) times daily. What changed: when to take this   miconazole 2 % cream Commonly known as: Baza Antifungal Apply topically 2 (two) times daily. What changed:  how much to take when to take  this additional instructions   pantoprazole 40 MG tablet Commonly known as: Protonix Take 1 tablet (40 mg total) by mouth daily.   polyethylene glycol 17 g packet Commonly known as: MIRALAX / GLYCOLAX Take 17 g by mouth 2 (two) times daily as needed for mild constipation or moderate constipation.   QUEtiapine 25 MG tablet Commonly known as: SEROQUEL Take 0.5 tablets (12.5 mg total) by mouth at bedtime as needed (anxiety).   senna-docusate 8.6-50 MG tablet Commonly known as: Senokot-S Take 1 tablet by mouth 2 (two) times daily.   triamcinolone cream 0.1 % Commonly known as: KENALOG Apply 1 Application topically 2 (two) times daily as needed (for rashes or irritation- affected areas).               Discharge Care Instructions  (From admission, onward)           Start     Ordered   09/22/22 0000  Discharge wound care:       Comments: As  per General surgery recommendations   09/22/22 1006            Follow-up Information     [facility]             . Call.   Contact information: [address] [place] [address] [phone]        Bufford Buttner, MD. Schedule an appointment as soon as possible for a visit.   Specialty: Nephrology Contact information: [address] [place] [address] [phone]                Allergies  Allergen Reactions   Amlodipine Other (See Comments)    EDEMA and "Allergic," per [facility]   Ace Inhibitors Other (See Comments) and Cough    "Allergic," per [facility]   Atorvastatin Other (See Comments)    Muscle & joint aches and "Allergic," per [facility]   Canagliflozin Other (See Comments)    INVOKANA = urinary tract infections and "Allergic," per [facility]   Clonidine Hcl Other (See Comments)    "Allergic," per [facility]   Buprenorphine Other (See Comments)    "Allergic," per [facility]   Lisinopril Other (See Comments)    "Allergic," per [facility]   Morphine And Codeine Rash and  Other (See Comments)    For long periods of time (??) and "Allergic," per [facility]    Consultations: General surgery. palliative care. Nephrology    Procedures/Studies: CT HEAD WO CONTRAST ( )  Result Date: 09/16/2022 CLINICAL DATA:  Acute encephalopathy.  COVID positive. EXAM: CT HEAD WITHOUT CONTRAST TECHNIQUE: Contiguous axial images were obtained from the base of the skull through the vertex without intravenous contrast. RADIATION DOSE REDUCTION: This exam was performed according to the departmental dose-optimization program which includes automated exposure control, adjustment of the mA and/or kV according to patient size and/or use of iterative reconstruction technique. COMPARISON:  Head CT 04/08/2020.  MRI head 01/05/2020. FINDINGS: Brain: No evidence of acute infarction, hemorrhage, hydrocephalus, extra-axial collection or mass lesion/mass effect. Again seen is mild diffuse atrophy. There is moderate patchy periventricular and deep white matter hypodensity similar to the prior study. Extra-axial calcified mass in the right frontal region represents calcified meningioma measuring 9 by 7 mm, unchanged from prior. Vascular: Atherosclerotic calcifications are present within the cavernous internal carotid arteries. Skull: Normal. Negative for fracture or focal lesion. Sinuses/Orbits: No acute finding. Other: None. IMPRESSION: 1. No acute intracranial process. 2. Stable atrophy and chronic microvascular ischemic changes of the white matter. Electronically Signed   By: Darliss Cheney M.D.   On: 09/16/2022 21:27   DG CHEST PORT 1 VIEW  Result Date: 09/16/2022 CLINICAL DATA:  Leukocytosis and weakness, initial encounter EXAM: PORTABLE CHEST 1 VIEW COMPARISON:  09/09/2022 FINDINGS: Cardiac shadow is stable. Aortic calcifications are again seen. Central vascular congestion is noted without significant edema. Persistent left basilar atelectasis is noted. No new focal abnormality is seen. IMPRESSION: Vascular  congestion and left basilar atelectasis Electronically Signed   By: Alcide Clever M.D.   On: 09/16/2022 17:04   ECHOCARDIOGRAM COMPLETE  Result Date: 09/12/2022    ECHOCARDIOGRAM REPORT   Patient Name:   [name] [name] Date of Exam: 09/12/2022 Medical Rec #:  [id number]        Height:       66.0 in Accession #:    [id number]       Weight:       223.3 lb Date of Birth:  03-Dec-1932  BSA:          2.096 m Patient Age:    89 years         BP:           132/66 mmHg Patient Gender: F                HR:           63 bpm. Exam Location:  Inpatient Procedure: 2D Echo, Cardiac Doppler, Color Doppler and Intracardiac            Opacification Agent Indications:    Bacteremia  History:        Patient has prior history of Echocardiogram examinations, most                 recent 01/05/2020. Stroke, Signs/Symptoms:Weakness/Lethargy; Risk                 Factors:Hypertension, Diabetes, Dyslipidemia and Obesity. CKD.  Sonographer:    Milda Smart Referring Phys: [id number] [facility]  Sonographer Comments: Patient is obese. Image acquisition challenging due to patient body habitus and Image acquisition challenging due to respiratory motion. IMPRESSIONS  1. Left ventricular ejection fraction, by estimation, is 60 to 65%. The left ventricle has normal function. The left ventricle has no regional wall motion abnormalities. There is mild concentric left ventricular hypertrophy. Left ventricular diastolic parameters are consistent with Grade II diastolic dysfunction (pseudonormalization).  2. Right ventricular systolic function is normal. The right ventricular size is normal. There is mildly elevated pulmonary artery systolic pressure. The estimated right ventricular systolic pressure is 39.8 mmHg.  3. Left atrial size was moderately dilated.  4. Right atrial size was mild to moderately dilated.  5. The mitral valve is degenerative. Mild mitral valve regurgitation. No evidence of mitral stenosis. Moderate mitral annular  calcification.  6. The aortic valve is tricuspid. There is mild calcification of the aortic valve. Aortic valve regurgitation is trivial. Aortic valve sclerosis/calcification is present, without any evidence of aortic stenosis.  7. The inferior vena cava is dilated in size with >50% respiratory variability, suggesting right atrial pressure of 8 mmHg. Conclusion(s)/Recommendation(s): No evidence of valvular vegetations on this transthoracic echocardiogram. Consider a transesophageal echocardiogram to exclude infective endocarditis if clinically indicated. FINDINGS  Left Ventricle: Left ventricular ejection fraction, by estimation, is 60 to 65%. The left ventricle has normal function. The left ventricle has no regional wall motion abnormalities. Definity contrast agent was given IV to delineate the left ventricular  endocardial borders. The left ventricular internal cavity size was normal in size. There is mild concentric left ventricular hypertrophy. Left ventricular diastolic parameters are consistent with Grade II diastolic dysfunction (pseudonormalization). Right Ventricle: The right ventricular size is normal. No increase in right ventricular wall thickness. Right ventricular systolic function is normal. There is mildly elevated pulmonary artery systolic pressure. The tricuspid regurgitant velocity is 2.82  m/s, and with an assumed right atrial pressure of 8 mmHg, the estimated right ventricular systolic pressure is 39.8 mmHg. Left Atrium: Left atrial size was moderately dilated. Right Atrium: Right atrial size was mild to moderately dilated. Pericardium: There is no evidence of pericardial effusion. Mitral Valve: The mitral valve is degenerative in appearance. Moderate mitral annular calcification. Mild mitral valve regurgitation. No evidence of mitral valve stenosis. MV peak gradient, 9.7 mmHg. The mean mitral valve gradient is 4.0 mmHg. Tricuspid Valve: The tricuspid valve is normal in structure. Tricuspid  valve regurgitation is mild . No evidence of tricuspid stenosis. Aortic Valve: The  aortic valve is tricuspid. There is mild calcification of the aortic valve. Aortic valve regurgitation is trivial. Aortic valve sclerosis/calcification is present, without any evidence of aortic stenosis. Pulmonic Valve: The pulmonic valve was not well visualized. Pulmonic valve regurgitation is trivial. No evidence of pulmonic stenosis. Aorta: The aortic root is normal in size and structure. Venous: The inferior vena cava is dilated in size with greater than 50% respiratory variability, suggesting right atrial pressure of 8 mmHg. IAS/Shunts: No atrial level shunt detected by color flow Doppler.  LEFT VENTRICLE PLAX 2D LVIDd:         3.90 cm     Diastology LVIDs:         3.00 cm     LV e' medial:    4.47 cm/s LV PW:         1.10 cm     LV E/e' medial:  38.5 LV IVS:        1.10 cm     LV e' lateral:   5.22 cm/s LVOT diam:     2.00 cm     LV E/e' lateral: 33.0 LV SV:         89 LV SV Index:   42 LVOT Area:     3.14 cm  LV Volumes (MOD) LV vol d, MOD A4C: 63.8 ml LV vol s, MOD A4C: 31.1 ml LV SV MOD A4C:     63.8 ml RIGHT VENTRICLE             IVC RV S prime:     10.40 cm/s  IVC diam: 2.10 cm TAPSE (M-mode): 2.3 cm LEFT ATRIUM             Index        RIGHT ATRIUM           Index LA diam:        4.30 cm 2.05 cm/m   RA Area:     18.70 cm LA Vol (A2C):   93.9 ml 44.80 ml/m  RA Volume:   51.80 ml  24.72 ml/m LA Vol (A4C):   93.2 ml 44.47 ml/m LA Biplane Vol: 95.6 ml 45.62 ml/m  AORTIC VALVE LVOT Vmax:   114.00 cm/s LVOT Vmean:  82.100 cm/s LVOT VTI:    0.282 m  AORTA Ao Root diam: 2.70 cm Ao Asc diam:  3.40 cm MITRAL VALVE                TRICUSPID VALVE MV Area (PHT): 3.30 cm     TR Peak grad:   31.8 mmHg MV Area VTI:   1.92 cm     TR Vmax:        282.00 cm/s MV Peak grad:  9.7 mmHg MV Mean grad:  4.0 mmHg     SHUNTS MV Vmax:       1.56 m/s     Systemic VTI:  0.28 m MV Vmean:      91.5 cm/s    Systemic Diam: 2.00 cm MV Decel Time:  230 msec MR Peak grad: 71.9 mmHg MR Vmax:      424.00 cm/s MV E velocity: 172.00 cm/s MV A velocity: 77.90 cm/s MV E/A ratio:  2.21 Arvilla Meres MD Electronically signed by Arvilla Meres MD Signature Date/Time: 09/12/2022/10:37:29 AM    Final    US RENAL  Result Date: 09/11/2022 CLINICAL DATA:  Acute kidney injury EXAM: RENAL / URINARY TRACT ULTRASOUND COMPLETE COMPARISON:  CT 09/08/2022 FINDINGS: Right Kidney: Renal measurements: 11 x 4.3  x 4.5 cm = volume: 112 mL. Diffusely increased parenchymal echotexture suggesting fatty infiltration. No hydronephrosis. Two cysts demonstrated in the upper pole, largest measuring 2 cm diameter. No imaging follow-up is indicated. No solid mass lesions. Left Kidney: Renal measurements: 10.1 x 6.3 x 5.4 cm = volume: 178 mL. Diffusely increased parenchymal echotexture suggesting fatty infiltration. No hydronephrosis. No solid mass. Bladder: Appears normal for degree of bladder distention. Other: None. IMPRESSION: 1. Increased renal parenchymal echotexture bilaterally suggesting chronic medical renal disease. 2. No hydronephrosis. Electronically Signed   By: Burman Nieves M.D.   On: 09/11/2022 21:50   DG CHEST PORT 1 VIEW  Result Date: 09/09/2022 CLINICAL DATA:  Dyspnea.  Back pain.  Weakness. EXAM: PORTABLE CHEST 1 VIEW COMPARISON:  09/08/2022 FINDINGS: Atherosclerotic calcification of the aortic arch. Heart size within normal limits for AP projection. Mild atelectasis laterally at the left lung base. Otherwise unremarkable. IMPRESSION: 1. Mild atelectasis laterally at the left lung base. 2.  Aortic Atherosclerosis (ICD10-I70.0). Electronically Signed   By: Gaylyn Rong M.D.   On: 09/09/2022 17:21   CT ABDOMEN PELVIS WO CONTRAST  Result Date: 09/08/2022 CLINICAL DATA:  Abdominal pain.  Sacral wound. EXAM: CT ABDOMEN AND PELVIS WITHOUT CONTRAST TECHNIQUE: Multidetector CT imaging of the abdomen and pelvis was performed following the standard protocol  without IV contrast. RADIATION DOSE REDUCTION: This exam was performed according to the departmental dose-optimization program which includes automated exposure control, adjustment of the mA and/or kV according to patient size and/or use of iterative reconstruction technique. COMPARISON:  June 11, 2019 FINDINGS: Lower chest: Bibasilar atelectasis. Hepatobiliary: No focal liver abnormality is seen. Status post cholecystectomy. No biliary dilatation. Pancreas: Unremarkable. No pancreatic ductal dilatation or surrounding inflammatory changes. Spleen: Normal in size without focal abnormality. Adrenals/Urinary Tract: Normal adrenal glands. Right upper pole renal cyst measuring 2.2 cm normal left kidney. Normal bladder. Stomach/Bowel: Stomach is within normal limits. Appendix appears normal. No evidence of bowel wall thickening, distention, or inflammatory changes. Vascular/Lymphatic: Aortic atherosclerosis. No enlarged abdominal or pelvic lymph nodes. Reproductive: Status post hysterectomy. No adnexal masses. Other: Right sacral wound extends from the skin surface to the outer surface of the gluteal musculature. Significant gas collection is seen with surrounding fat stranding. The process does not seem to involve the rectum. Musculoskeletal: No acute or significant osseous findings. IMPRESSION: 1. Right sacral wound extends from the skin surface to the outer surface of the gluteal musculature. Significant gas collection is seen with surrounding fat stranding. The process does not seem to involve the rectum. 2. Aortic atherosclerosis. Aortic Atherosclerosis (ICD10-I70.0). Electronically Signed   By: Ted Mcalpine M.D.   On: 09/08/2022 14:56   DG Chest Portable 1 View  Result Date: 09/08/2022 CLINICAL DATA:  Recent diagnosis of COVID with increasing weakness EXAM: PORTABLE CHEST 1 VIEW COMPARISON:  Chest radiograph dated 01/08/2022 FINDINGS: Normal lung volumes. No focal consolidations. Trace left pleural  effusion. No pneumothorax. Enlarged cardiomediastinal silhouette. No acute osseous abnormality. IMPRESSION: 1. Trace left pleural effusion.  No focal consolidations. 2. Cardiomegaly. Electronically Signed   By: Agustin Cree M.D.   On: 09/08/2022 13:38      Subjective: Patient seen and examined at bedside.  Awake, answers some questions but still slow to respond and poor historian.  No fever, vomiting, agitation reported.  Patient feels slightly better.  Daughter present at bedside.  Oral intake remains poor as per the daughter at bedside. Discharge Exam: Vitals:   09/21/22 2300 09/22/22 0606  BP: (!) 168/65 Marland Kitchen)  157/78  Pulse: 66 71  Resp:  16  Temp:  99.4 F (37.4 C)  SpO2:  92%    General: Pt is wake, answers some questions but still slow to respond and poor historian.  Elderly female lying in bed.  On room air. Cardiovascular: rate controlled, S1/S2 + Respiratory: bilateral decreased breath sounds at bases with some scattered crackles Abdominal: Soft, morbidly obese, NT, ND, bowel sounds + Extremities: Mild lower extremity edema present bilaterally, more on the right side; no cyanosis    The results of significant diagnostics from this hospitalization (including imaging, microbiology, ancillary and laboratory) are listed below for reference.     Microbiology: Recent Results (from the past 240 hour(s))  Aerobic/Anaerobic Culture w Gram Stain (surgical/deep wound)     Status: None   Collection Time: 09/12/22  3:05 PM   Specimen: Path Tissue  Result Value Ref Range Status   Specimen Description   Final    ABSCESS Performed at [facility], [address], [place], [place] [address]    Special Requests   Final    RIGHT GLUTEAL Performed at [facility], [address], [place], [place] [address]    Gram Stain NO WBC SEEN NO ORGANISMS SEEN   Final   Culture   Final    RARE CANDIDA ALBICANS RARE BACTEROIDES OVATUS/XYLANISOLVENS RARE  BACTEROIDES FRAGILIS BETA LACTAMASE POSITIVE Performed at [facility], [address] [place], [place] [address]    Report Status 09/16/2022 FINAL  Final  Aerobic/Anaerobic Culture w Gram Stain (surgical/deep wound)     Status: None   Collection Time: 09/12/22  3:26 PM   Specimen: Buttocks; Abscess  Result Value Ref Range Status   Specimen Description   Final    ABSCESS Performed at [facility], [address], [place], [place] [address]    Special Requests   Final    R GLUTEAL Performed at [facility], [address] [place], [place] [address]    Gram Stain NO WBC SEEN NO ORGANISMS SEEN   Final   Culture   Final    RARE CANDIDA ALBICANS RARE BACTEROIDES OVATUS/XYLANISOLVENS RARE BACTEROIDES FRAGILIS BETA LACTAMASE POSITIVE Performed at [facility], [address] [place], [place] [address]    Report Status 09/16/2022 FINAL  Final     Labs: BNP (last 3 results) No results for input(s): "BNP" in the last 8760 hours. Basic Metabolic Panel: Recent Labs  Lab 09/16/22 0541 09/17/22 0604 09/18/22 0459 09/20/22 0616  NA 141 148* 147* 145  K 4.7 4.4 3.5 3.5  CL 108 111 110 107  CO2 23 23 28 28   GLUCOSE 276* 252* 190* 151*  BUN 58* 42* 33* 21  CREATININE 1.42* 1.34* 1.22* 1.21*  CALCIUM 8.2* 8.3* 8.1* 7.7*  MG 2.2  --   --   --    Liver Function Tests: No results for input(s): "AST", "ALT", "ALKPHOS", "BILITOT", "PROT", "ALBUMIN" in the last 168 hours. No results for input(s): "LIPASE", "AMYLASE" in the last 168 hours. No results for input(s): "AMMONIA" in the last 168 hours. CBC: Recent Labs  Lab 09/16/22 0541 09/17/22 0604 09/18/22 0459 09/20/22 0616  WBC 21.4* 17.8* 17.5* 10.4  HGB 9.6* 9.2* 8.8* 8.5*  HCT 31.3* 30.0* 28.3* 27.9*  MCV 83.9 84.7 83.5 86.9  PLT 382 359 377 272   Cardiac Enzymes: No results for input(s): "CKTOTAL", "CKMB", "CKMBINDEX", "TROPONINI" in the last 168 hours. BNP: Invalid  input(s): "POCBNP" CBG: Recent Labs  Lab 09/21/22 1127 09/21/22 1548 09/21/22 2046 09/21/22 2258 09/22/22 0751  GLUCAP 165* 77 113* 147* 128*   D-Dimer No results for input(s): "DDIMER" in the last 72 hours. Hgb A1c No results for input(s): "HGBA1C" in the last 72 hours. Lipid Profile No results for input(s): "CHOL", "HDL", "LDLCALC", "TRIG", "CHOLHDL", "LDLDIRECT" in the last 72 hours. Thyroid function studies No results for input(s): "TSH", "T4TOTAL", "T3FREE", "THYROIDAB" in the last 72 hours.  Invalid input(s): "FREET3" Anemia work up No results for input(s): "VITAMINB12", "FOLATE", "FERRITIN", "TIBC", "IRON", "RETICCTPCT" in the last 72 hours. Urinalysis    Component Value Date/Time   COLORURINE YELLOW 09/13/2022 1652   APPEARANCEUR CLEAR 09/13/2022 1652   LABSPEC 1.015 09/13/2022 1652   PHURINE 5.0 09/13/2022 1652   GLUCOSEU NEGATIVE 09/13/2022 1652   HGBUR SMALL (A) 09/13/2022 1652   BILIRUBINUR NEGATIVE 09/13/2022 1652   KETONESUR NEGATIVE 09/13/2022 1652   PROTEINUR 30 (A) 09/13/2022 1652   UROBILINOGEN 0.2 07/23/2009 1015   NITRITE NEGATIVE 09/13/2022 1652   LEUKOCYTESUR NEGATIVE 09/13/2022 1652   Sepsis Labs Recent Labs  Lab 09/16/22 0541 09/17/22 0604 09/18/22 0459 09/20/22 0616  WBC 21.4* 17.8* 17.5* 10.4   Microbiology Recent Results (from the past 240 hour(s))  Aerobic/Anaerobic Culture w Gram Stain (surgical/deep wound)     Status: None   Collection Time: 09/12/22  3:05 PM   Specimen: Path Tissue  Result Value Ref Range Status   Specimen Description   Final    ABSCESS Performed at [facility], [address] [place], [place] [address]    Special Requests   Final    RIGHT GLUTEAL Performed at [facility], [address], [place], [place] [address]    Gram Stain NO WBC SEEN NO ORGANISMS SEEN   Final   Culture   Final    RARE CANDIDA ALBICANS RARE BACTEROIDES OVATUS/XYLANISOLVENS RARE BACTEROIDES  FRAGILIS BETA LACTAMASE POSITIVE Performed at [facility], [address] [place], [place] [address]    Report Status 09/16/2022 FINAL  Final  Aerobic/Anaerobic Culture w Gram Stain (surgical/deep wound)     Status: None   Collection Time: 09/12/22  3:26 PM   Specimen: Buttocks; Abscess  Result Value Ref Range Status   Specimen Description   Final    ABSCESS Performed at [facility], [address] [place], [place] [address]    Special Requests   Final    R GLUTEAL Performed at [facility], [address], [place], [place] [address]    Gram Stain NO WBC SEEN NO ORGANISMS SEEN   Final   Culture   Final    RARE CANDIDA ALBICANS RARE BACTEROIDES OVATUS/XYLANISOLVENS RARE BACTEROIDES FRAGILIS BETA LACTAMASE POSITIVE Performed at [facility], [address], [place], [place] [address]    Report Status 09/16/2022 FINAL  Final     Time coordinating discharge: 35 minutes  SIGNED:   Glade Lloyd, MD  [facility] 09/22/2022, 10:06 AM

## 2022-09-22 NOTE — Progress Notes (Addendum)
   09/22/22 1130 Hydrotherapy note  Subjective Assessment  Patient and Family Stated Goals to get off the mattress, it is a board under me  Date of Onset  (present on admission)  Prior Treatments dressing change  Pressure Injury 09/19/22 Sacrum Right Unstageable - Full thickness tissue loss in which the base of the injury is covered by slough (yellow, tan, gray, green or brown) and/or eschar (tan, brown or black) in the wound bed. large  sacral wound with necroti  Date First Assessed: 09/19/22   Location: Sacrum  Location Orientation: Right  Staging: Unstageable - Full thickness tissue loss in which the base of the injury is covered by slough (yellow, tan, gray, green or brown) and/or eschar (tan, brown or blac...  Dressing Type Foam - Lift dressing to assess site every shift;Gauze (Comment)  Dressing Clean, Dry, Intact  Dressing Change Frequency Twice a day  Site / Wound Assessment Friable  % Wound base Red or Granulating 0%  % Wound base Other/Granulation Tissue (Comment) 100%  Peri-wound Assessment Maceration;Excoriated;Erythema (non-blanchable)  Wound Length (cm) 6 cm  Wound Width (cm) 4 cm  Wound Depth (cm) 3 cm  Wound Surface Area (cm^2) 24 cm^2  Wound Volume (cm^3) 72 cm^3  Tunneling (cm) 7 (a 500,)  Undermining (cm) 4 (at 1200)  Margins Unattached edges (unapproximated)  Drainage Amount Moderate  Drainage Description Purulent  Treatment Debridement (Selective);Hydrotherapy (Pulse lavage);Packing (Saline gauze) (santyl)  Hydrotherapy  Pulsed Lavage with Suction (psi) 12 psi  Pulsed Lavage with Suction - Normal Saline Used 1000 mL  Pulsed Lavage Tip Tip with splash shield  Pulsed lavage therapy - wound location sacrum, righ glute  Selective Debridement (non-excisional)  Selective Debridement (non-excisional) - Location sacrum  Selective Debridement (non-excisional) - Tools Used Forceps;Scissors  Selective Debridement (non-excisional) - Tissue Removed slough, necrotic   Wound Therapy - Assess/Plan/Recommendations  Wound Therapy - Clinical Statement the patient provided with pain medication and tolerated PLS well. Large amount of dark slough, with purulent drainge in wound. Debridement of some necrotic tissue achieved. Patient and daughter report that the mattress is not supportive. Patient found with a pillow and the air chair cushion under buttocks in supine in bed. Mattress determined to not be inflating. After hydro treatment, Portable personnel, Derek, called to the room and determined the matteress air had failed. Patient transferred to a new air mattress/bed with subjective relief of the pressure under buttocks. Patient is to DC to SNF today. he patient reports that th  Wound Therapy - Functional Problem List WC bound, able to pivot to trnasfer with extensive support.  Factors Delaying/Impairing Wound Healing Immobility;Incontinence;Multiple medical problems  Hydrotherapy Plan Debridement;Dressing change;Patient/family education;Pulsatile lavage with suction  Wound Therapy - Frequency 2X / week  Wound Therapy - Current Recommendations Case manager/social work  Wound Therapy - Follow Up Recommendations f/u pulsed lavage with suction;f/u selective debridement  Wound Therapy Goals - Improve the function of patient's integumentary system by progressing the wound(s) through the phases of wound healing by:  Decrease Necrotic Tissue to 50  Decrease Necrotic Tissue - Progress Progressing toward goal  Increase Granulation Tissue to 50  Increase Granulation Tissue - Progress Progressing toward goal  Additional Wound Therapy Goal reposition in bed  Additional Wound Therapy Goal - Progress Progressing toward goal  Time For Goal Achievement 2 weeks  Wound Therapy - Potential for Goals Fair   Blanchard Kelch PT Acute Rehabilitation Services Office [phone] Weekend pager-[phone]  Deb<20cm,gun and tip, dress x1

## 2022-09-22 NOTE — Progress Notes (Signed)
I called and spoke with Joni Reining at [facility]. Report given on patient, all questions answered. Joni Reining aware [facility] has been called for transport.

## 2022-09-22 NOTE — TOC Transition Note (Signed)
Transition of Care [facility]) - CM/SW Discharge Note   Patient Details  Name: [name] [name] MRN: [id number] Date of Birth: [birth year]  Transition of Care [facility]) CM/SW Contact:  Otelia Santee, LCSW Phone Number: 09/22/2022, 10:55 AM   Clinical Narrative:    Pt to transfer to [facility] for ST SNF placement. Pt will be going to room 102. RN to call report to [phone]. DC packet made and placed at RN station. Spoke with pt's son Link Snuffer who is agreeable to discharge plan. [facility] called at 10:35am for transportation. Per [facility] eta is between 11:30am and 12:30pm.    Final next level of care: Skilled Nursing Facility Barriers to Discharge: Barriers Resolved   Patient Goals and CMS Choice CMS Medicare.gov Compare Post Acute Care list provided to:: Patient Represenative (must comment) Choice offered to / list presented to : Adult Children  Discharge Placement     Existing PASRR number confirmed : 09/14/22          Patient [facility] bed at: [facility] Patient to be transferred to facility by: [facility] Name of family member notified: Ray Church Patient and family notified of of transfer: 09/22/22  Discharge Plan and Services Additional resources added to the After Visit Summary for   In-house Referral: Clinical Social Work Discharge Planning Services: NA Post Acute Care Choice: Skilled Nursing Facility          DME Arranged: N/A DME Agency: NA                  Social Determinants of Health (SDOH) Interventions SDOH Screenings   Food Insecurity: No Food Insecurity (09/08/2022)  Housing: Low Risk  (09/08/2022)  Transportation Needs: No Transportation Needs (09/08/2022)  Utilities: Not At Risk (09/08/2022)  Depression (PHQ2-9): Low Risk  (03/27/2022)  Financial Resource Strain: Low Risk  (06/14/2022)   Received from [facility], [facility]  Physical Activity: Inactive (05/19/2019)   Received from [facility], [facility]  Social Connections: Socially Integrated  (12/05/2021)   Received from [facility], [facility]  Stress: No Stress Concern Present (12/05/2021)   Received from [facility], [facility]  Tobacco Use: Low Risk  (09/12/2022)     Readmission Risk Interventions     No data to display

## 2022-10-27 ENCOUNTER — Encounter
Payer: Medicare Other | Attending: Physical Medicine and Rehabilitation | Admitting: Physical Medicine and Rehabilitation

## 2022-10-27 DIAGNOSIS — M25561 Pain in right knee: Secondary | ICD-10-CM | POA: Diagnosis not present

## 2022-10-27 NOTE — Progress Notes (Signed)
Subjective:    Patient ID: [name] [name], female    DOB: 02/20/1933, 89 y.o.   MRN: [id number]  HPI: [name] [name] is a 89 y.o. female who is here for HFU appointment regarding her Nontraumatic acute hemorrhage of basal ganglia, Essential Hypertension, Primary Osteoarthritis of Right Knee and  Controlled Type 2 Diabetes Mellitus with hyperglycemia with long-term current insulin use. [name] [name] was brought to [facility] via EMS on 01/04/2020 after she collapsed in church during service, while standing. Neurology consulted.  CT Head WO Contrast:  IMPRESSION: 1.3 x 2.7 x 1.5 cm acute parenchymal hemorrhage centered within the left thalamus and basal ganglia. Local mass effect with partial effacement of the third ventricle and 2 mm rightward midline shift at the level of the septum pellucidum.   Mild cerebral atrophy with moderate chronic small vessel ischemic disease.   Known chronic pontine lacunar infarct.   7 mm focus of lobular dural-based calcification versus incidental small calcified meningioma overlying the anterior right frontal lobe.   IMPRESSION: Left thalamic hemorrhage unchanged from earlier today. No new hemorrhage or hydrocephalus.   Moderate chronic microvascular ischemic change in the white matter.  MR Head WO Contrast:  IMPRESSION: 1. No visible mass or vascular lesion underlying the left thalamic hematoma. 2. Extensive chronic small vessel ischemia. 3. Intracranial atherosclerosis.  [name] [name] was admitted to inpatient Rehabilitation on 01/16/2020 and discharged to [facility] on 02/12/2020. She is receiving outpatient therapy with a contracted agency at [facility] per social work discharge summary Lurena Joiner, [place] LCSW.  She states she has pain in her right eye, no drainage noted. She will F/U with her PCP.  Also reports right knee pain requesting cortisone injection, she will be scheduled with Dr Allena Katz, she verbalizes understanding.  She rated her pain on The Health and History 0. Also reports she has a good appetite.    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  She was in sales  The Arletta Bale has benefitted her greatly initially but the last injection did not provide any relief She has also been having more swelling in the right knee She was able to do her daily functions -COVID really slowed her prior progress -she has been having the EMLA cream -BP elevated today and patient says it usually is 140 systolic Voltaren gel has already been helping.  She has a BM one every other day.   1) Fall Feeling wonderful, but fell once when she was transferring to go to the bathroom  2) Right leg heaviness: -leg has felt heavy since last steroid injection -no longer walking  3) Left knee pain: -wound doctor said she is not a candidate at this time for the shots -the voltaren gel has been helpful  Pain Inventory Average Pain 7 Pain Right Now 0 My pain is aching  In the last 24 hours, has pain interfered with the following? General activity 0 Relation with others 0 Enjoyment of life 0 What TIME of day is your pain at its worst? varies Sleep (in general) Good  Pain is worse with: walking and standing Pain improves with: rest and injections Relief from Meds:  na  No family history on file. Social History   Socioeconomic History   Marital status: Widowed    Spouse name: Not on file   Number of children: Not on file   Years of education: Not on file  Highest education level: Not on file  Occupational History   Not on file  Tobacco Use   Smoking status: Never   Smokeless tobacco: Never  Vaping Use   Vaping status: Never Used  Substance and Sexual Activity   Alcohol use: Never   Drug use: Never   Sexual activity: Not on file  Other Topics Concern   Not on file  Social History Narrative   Not on file   Social Determinants of  Health   Financial Resource Strain: Low Risk  (06/14/2022)   Received from [facility], [facility]   Overall Financial Resource Strain (CARDIA)    Difficulty of Paying Living Expenses: Not hard at all  Food Insecurity: No Food Insecurity (09/08/2022)   Hunger Vital Sign    Worried About Running Out of Food in the Last Year: Never true    Ran Out of Food in the Last Year: Never true  Transportation Needs: No Transportation Needs (09/08/2022)   PRAPARE - Administrator, Civil Service (Medical): No    Lack of Transportation (Non-Medical): No  Physical Activity: Inactive (05/19/2019)   Received from [facility], [facility]   Exercise Vital Sign    Days of Exercise per Week: 0 days    Minutes of Exercise per Session: 0 min  Stress: No Stress Concern Present (12/05/2021)   Received from [facility], [facility] of Occupational Health - Occupational Stress Questionnaire    Feeling of Stress : Not at all  Social Connections: Socially Integrated (12/05/2021)   Received from [facility], [facility]   Social Network    How would you rate your social network (family, work, friends)?: Good participation with social networks   Past Surgical History:  Procedure Laterality Date   CHOLECYSTECTOMY     COLONOSCOPY     ERCP     INCISION AND DRAINAGE ABSCESS N/A 09/12/2022   Procedure: INCISION AND DRAINAGE ABSCESS;  Surgeon: Karie Soda, MD;  Location: [facility];  Service: General;  Laterality: N/A;   PARTIAL HYSTERECTOMY  89years old   POLYPECTOMY     UPPER GASTROINTESTINAL ENDOSCOPY     WRIST FRACTURE SURGERY  left arm,89years old   Past Surgical History:  Procedure Laterality Date   CHOLECYSTECTOMY     COLONOSCOPY     ERCP     INCISION AND DRAINAGE ABSCESS N/A 09/12/2022   Procedure: INCISION AND DRAINAGE ABSCESS;  Surgeon: Karie Soda, MD;  Location: [facility];  Service: General;  Laterality: N/A;   PARTIAL HYSTERECTOMY  89years old    POLYPECTOMY     UPPER GASTROINTESTINAL ENDOSCOPY     WRIST FRACTURE SURGERY  left arm,89years old   Past Medical History:  Diagnosis Date   Colon polyp    Diabetes mellitus 1992   Hypertension 1996   Pancreatitis    There were no vitals taken for this visit.  Opioid Risk Score:   Fall Risk Score:  `1  Depression screen PHQ 2/9     03/27/2022    1:50 PM 01/16/2022    1:23 PM 10/13/2021   11:33 AM 07/12/2021   11:43 AM 04/05/2021   12:56 PM 01/13/2021   11:28 AM 10/28/2020   11:00 AM  Depression screen PHQ 2/9  Decreased Interest 0 0 0 0 0 0 0  Down, Depressed, Hopeless 0 0 0 0 0 0 0  PHQ - 2 Score 0 0 0 0 0 0 0  No family history on file. Social History   Socioeconomic History   Marital status: Widowed    Spouse name: Not on file   Number of children: Not on file   Years of education: Not on file   Highest education level: Not on file  Occupational History   Not on file  Tobacco Use   Smoking status: Never   Smokeless tobacco: Never  Vaping Use   Vaping status: Never Used  Substance and Sexual Activity   Alcohol use: Never   Drug use: Never   Sexual activity: Not on file  Other Topics Concern   Not on file  Social History Narrative   Not on file   Social Determinants of Health   Financial Resource Strain: Low Risk  (06/14/2022)   Received from [facility], [facility]   Overall Financial Resource Strain (CARDIA)    Difficulty of Paying Living Expenses: Not hard at all  Food Insecurity: No Food Insecurity (09/08/2022)   Hunger Vital Sign    Worried About Running Out of Food in the Last Year: Never true    Ran Out of Food in the Last Year: Never true  Transportation Needs: No Transportation Needs (09/08/2022)   PRAPARE - Administrator, Civil Service (Medical): No    Lack of Transportation (Non-Medical): No  Physical Activity: Inactive (05/19/2019)   Received from [facility], [facility]   Exercise Vital Sign    Days of  Exercise per Week: 0 days    Minutes of Exercise per Session: 0 min  Stress: No Stress Concern Present (12/05/2021)   Received from [facility], [facility] of Occupational Health - Occupational Stress Questionnaire    Feeling of Stress : Not at all  Social Connections: Socially Integrated (12/05/2021)   Received from [facility], [facility]   Social Network    How would you rate your social network (family, work, friends)?: Good participation with social networks   Past Surgical History:  Procedure Laterality Date   CHOLECYSTECTOMY     COLONOSCOPY     ERCP     INCISION AND DRAINAGE ABSCESS N/A 09/12/2022   Procedure: INCISION AND DRAINAGE ABSCESS;  Surgeon: Karie Soda, MD;  Location: [facility];  Service: General;  Laterality: N/A;   PARTIAL HYSTERECTOMY  89years old   POLYPECTOMY     UPPER GASTROINTESTINAL ENDOSCOPY     WRIST FRACTURE SURGERY  left arm,89years old   Past Medical History:  Diagnosis Date   Colon polyp    Diabetes mellitus 1992   Hypertension 1996   Pancreatitis    There were no vitals taken for this visit.  Opioid Risk Score:   Fall Risk Score:  `1  Depression screen PHQ 2/9     03/27/2022    1:50 PM 01/16/2022    1:23 PM 10/13/2021   11:33 AM 07/12/2021   11:43 AM 04/05/2021   12:56 PM 01/13/2021   11:28 AM 10/28/2020   11:00 AM  Depression screen PHQ 2/9  Decreased Interest 0 0 0 0 0 0 0  Down, Depressed, Hopeless 0 0 0 0 0 0 0  PHQ - 2 Score 0 0 0 0 0 0 0    Review of Systems  Constitutional: Negative.   HENT: Negative.    Eyes: Negative.   Respiratory: Negative.    Cardiovascular: Negative.        Limb swelling , right arm  Gastrointestinal: Negative.   Endocrine: Negative.  High blood sugar  Genitourinary: Negative.   Musculoskeletal:  Positive for back pain and gait problem.       Right knee pain  Allergic/Immunologic: Negative.   Neurological:  Positive for weakness and numbness.  Hematological:  Negative.   Psychiatric/Behavioral: Negative.    All other systems reviewed and are negative.      Objective:   Physical Exam Vitals and nursing note reviewed. BP 177/71 Constitutional:      Appearance: Normal appearance.  Cardiovascular:     Rate and Rhythm: Normal rate and regular rhythm.     Pulses: Normal pulses.     Heart sounds: Normal heart sounds.  Pulmonary:     Effort: Pulmonary effort is normal.     Breath sounds: Normal breath sounds.  Musculoskeletal:     Cervical back: Normal range of motion and neck supple.     Comments: Normal Muscle Bulk and Muscle Testing Reveals:  Upper Extremities: Full ROM and Muscle Strength 5/5 Lower Extremities: Full ROM and Muscle Strength 5/5 Arrived in wheelchair  Right knee TTP and swollen Skin:    General: Skin is warm and dry.  Neurological:     Mental Status: She is alert and oriented to person, place, and time.  Psychiatric:        Mood and Affect: Mood normal.        Behavior: Behavior normal.         Assessment & Plan:  1.Nontraumatic acute hemorrhage of basal ganglia: Continue Outpatient Therapy.  She has a scheduled appointment with Neurology.   2.Essential Hypertension: Continue current medication regimen. PCP following. Continue to monitor.  -BP is 177/77 today.  -discussed semglutide, ozempic -discussed that in facility BP is usually much better controlled -Advised checking BP daily at home and logging results to bring into follow-up appointment with PCP and myself. -Reviewed BP meds today.  -Advised regarding healthy foods that can help lower blood pressure and provided with a list: 1) citrus foods- high in vitamins and minerals 2) salmon and other fatty fish - reduces inflammation and oxylipins 3) swiss chard (leafy green)- high level of nitrates 4) pumpkin seeds- one of the best natural sources of magnesium 5) Beans and lentils- high in fiber, magnesium, and potassium 6) Berries- high in flavonoids 7)  Amaranth (whole grain, can be cooked similarly to rice and oats)- high in magnesium and fiber 8) Pistachios- even more effective at reducing BP than other nuts 9) Carrots- high in phenolic compounds that relax blood vessels and reduce inflammation 10) Celery- contain phthalides that relax tissues of arterial walls 11) Tomatoes- can also improve cholesterol and reduce risk of heart disease 12) Broccoli- good source of magnesium, calcium, and potassium 13) Greek yogurt: high in potassium and calcium 14) Herbs and spices: Celery seed, cilantro, saffron, lemongrass, black cumin, ginseng, cinnamon, cardamom, sweet basil, and ginger 15) Chia and flax seeds- also help to lower cholesterol and blood sugar 16) Beets- high levels of nitrates that relax blood vessels  17) spinach and bananas- high in potassium  -Provided lise of supplements that can help with hypertension:  1) magnesium: one high quality brand is Bioptemizers since it contains all 7 types of magnesium, otherwise over the counter magnesium gluconate 400mg  is a good option 2) B vitamins 3) vitamin D 4) potassium 5) CoQ10 6) L-arginine 7) Vitamin C 8) Beetroot -Educated that goal BP is 120/80. -Made goal to incorporate some of the above foods into diet.     3.Controlled Type 2 Diabetes  Mellitus with hyperglycemia with long-term current insulin use. PCP following.   5. Aphasia: recommended continuing speech, educated that progress can continue in the first 2 years 6. Impaired mobility: start taking daily walks outside.  7. Constipation:  -Provided list of following foods that help with constipation and highlighted a few: 1) prunes- contain high amounts of fiber.  2) apples- has a form of dietary fiber called pectin that accelerates stool movement and increases beneficial gut bacteria 3) pears- in addition to fiber, also high in fructose and sorbitol which have laxative effect 4) figs- contain an enzyme ficin which helps to speed  colonic transit 5) kiwis- contain an enzyme actinidin that improves gut motility and reduces constipation 6) oranges- rich in pectin (like apples) 7) grapefruits- contain a flavanol naringenin which has a laxative effect 8) vegetables- rich in fiber and also great sources of folate, vitamin C, and K 9) artichoke- high in inulin, prebiotic great for the microbiome 10) chicory- increases stool frequency and softness (can be added to coffee) 11) rhubarb- laxative effect 12) sweet potato- high fiber 13) beans, peas, and lentils- contain both soluble and insoluble fiber 14) chia seeds- improves intestinal health and gut flora 15) flaxseeds- laxative effect 16) whole grain rye bread- high in fiber 17) oat bran- high in soluble and insoluble fiber 18) kefir- softens stools -recommended to try at least one of these foods every day.  -drink 6-8 glasses of water per day -walk regularly, especially after meals.   4) Right knee pain: -discussed trying next Zilretta injection under fluoroscopic guidance with Dr. Benjie Karvonen  -Discussed Zynex heating/cooling blanket and Nexwave but patient defers -prescribed red light therapy -discussed that pain has improved -continue lidocaine patches -discussed semglutide -continue voltaren gel 4x/day  5) Hypoglycemic episodes: -discussed that she does not feel very right when she drops low  5 minutes spent in discussion of the [facility] as an option of her knee pain, she tried the knee brace and it was too restrictive, discussed alternative olive oil with the voltaren gel.

## 2022-10-28 ENCOUNTER — Encounter (HOSPITAL_COMMUNITY): Payer: Self-pay | Admitting: Emergency Medicine

## 2022-10-28 ENCOUNTER — Inpatient Hospital Stay (HOSPITAL_COMMUNITY)
Admission: EM | Admit: 2022-10-28 | Discharge: 2022-11-04 | DRG: 291 | Disposition: A | Discharge status: Home Health | Payer: Medicare Other | Attending: Family Medicine | Admitting: Family Medicine

## 2022-10-28 ENCOUNTER — Emergency Department (HOSPITAL_COMMUNITY): Payer: Medicare Other

## 2022-10-28 ENCOUNTER — Other Ambulatory Visit: Payer: Self-pay

## 2022-10-28 DIAGNOSIS — Z90711 Acquired absence of uterus with remaining cervical stump: Secondary | ICD-10-CM | POA: Diagnosis not present

## 2022-10-28 DIAGNOSIS — N1832 Chronic kidney disease, stage 3b: Secondary | ICD-10-CM | POA: Diagnosis present

## 2022-10-28 DIAGNOSIS — Z8673 Personal history of transient ischemic attack (TIA), and cerebral infarction without residual deficits: Secondary | ICD-10-CM

## 2022-10-28 DIAGNOSIS — E1122 Type 2 diabetes mellitus with diabetic chronic kidney disease: Secondary | ICD-10-CM | POA: Diagnosis present

## 2022-10-28 DIAGNOSIS — Z79899 Other long term (current) drug therapy: Secondary | ICD-10-CM | POA: Diagnosis not present

## 2022-10-28 DIAGNOSIS — R0902 Hypoxemia: Secondary | ICD-10-CM | POA: Diagnosis present

## 2022-10-28 DIAGNOSIS — J9601 Acute respiratory failure with hypoxia: Secondary | ICD-10-CM | POA: Diagnosis present

## 2022-10-28 DIAGNOSIS — Z7982 Long term (current) use of aspirin: Secondary | ICD-10-CM | POA: Diagnosis not present

## 2022-10-28 DIAGNOSIS — I2489 Other forms of acute ischemic heart disease: Secondary | ICD-10-CM | POA: Diagnosis present

## 2022-10-28 DIAGNOSIS — Z794 Long term (current) use of insulin: Secondary | ICD-10-CM | POA: Diagnosis not present

## 2022-10-28 DIAGNOSIS — L0231 Cutaneous abscess of buttock: Secondary | ICD-10-CM | POA: Diagnosis present

## 2022-10-28 DIAGNOSIS — Z66 Do not resuscitate: Secondary | ICD-10-CM | POA: Diagnosis present

## 2022-10-28 DIAGNOSIS — I10 Essential (primary) hypertension: Secondary | ICD-10-CM | POA: Diagnosis present

## 2022-10-28 DIAGNOSIS — N183 Chronic kidney disease, stage 3 unspecified: Secondary | ICD-10-CM | POA: Diagnosis present

## 2022-10-28 DIAGNOSIS — E1165 Type 2 diabetes mellitus with hyperglycemia: Secondary | ICD-10-CM | POA: Diagnosis present

## 2022-10-28 DIAGNOSIS — I5043 Acute on chronic combined systolic (congestive) and diastolic (congestive) heart failure: Secondary | ICD-10-CM | POA: Diagnosis not present

## 2022-10-28 DIAGNOSIS — I5033 Acute on chronic diastolic (congestive) heart failure: Secondary | ICD-10-CM | POA: Diagnosis present

## 2022-10-28 DIAGNOSIS — Z885 Allergy status to narcotic agent status: Secondary | ICD-10-CM

## 2022-10-28 DIAGNOSIS — J45901 Unspecified asthma with (acute) exacerbation: Secondary | ICD-10-CM | POA: Diagnosis present

## 2022-10-28 DIAGNOSIS — E785 Hyperlipidemia, unspecified: Secondary | ICD-10-CM | POA: Diagnosis present

## 2022-10-28 DIAGNOSIS — Z888 Allergy status to other drugs, medicaments and biological substances status: Secondary | ICD-10-CM

## 2022-10-28 DIAGNOSIS — I130 Hypertensive heart and chronic kidney disease with heart failure and stage 1 through stage 4 chronic kidney disease, or unspecified chronic kidney disease: Secondary | ICD-10-CM | POA: Diagnosis present

## 2022-10-28 DIAGNOSIS — J189 Pneumonia, unspecified organism: Secondary | ICD-10-CM | POA: Diagnosis present

## 2022-10-28 DIAGNOSIS — I509 Heart failure, unspecified: Principal | ICD-10-CM

## 2022-10-28 LAB — BASIC METABOLIC PANEL
Anion gap: 9 (ref 5–15)
BUN: 21 mg/dL (ref 8–23)
CO2: 26 mmol/L (ref 22–32)
Calcium: 7.7 mg/dL — ABNORMAL LOW (ref 8.9–10.3)
Chloride: 104 mmol/L (ref 98–111)
Creatinine, Ser: 1.05 mg/dL — ABNORMAL HIGH (ref 0.44–1.00)
GFR, Estimated: 51 mL/min — ABNORMAL LOW (ref >60)
Glucose, Bld: 190 mg/dL — ABNORMAL HIGH (ref 70–99)
Potassium: 4.2 mmol/L (ref 3.5–5.1)
Sodium: 139 mmol/L (ref 135–145)

## 2022-10-28 LAB — BLOOD GAS, VENOUS
Acid-Base Excess: 5.1 mmol/L — ABNORMAL HIGH (ref 0.0–2.0)
Bicarbonate: 32.2 mmol/L — ABNORMAL HIGH (ref 20.0–28.0)
O2 Saturation: 57.4 %
Patient temperature: 37
pCO2, Ven: 61 mmHg — ABNORMAL HIGH (ref 44–60)
pH, Ven: 7.33 (ref 7.25–7.43)
pO2, Ven: 37 mmHg (ref 32–45)

## 2022-10-28 LAB — TROPONIN I (HIGH SENSITIVITY)
Troponin I (High Sensitivity): 24 ng/L — ABNORMAL HIGH (ref <18)
Troponin I (High Sensitivity): 23 ng/L — ABNORMAL HIGH (ref <18)

## 2022-10-28 LAB — CBC
HCT: 31.5 % — ABNORMAL LOW (ref 36.0–46.0)
Hemoglobin: 9.4 g/dL — ABNORMAL LOW (ref 12.0–15.0)
MCH: 25.6 pg — ABNORMAL LOW (ref 26.0–34.0)
MCHC: 29.8 g/dL — ABNORMAL LOW (ref 30.0–36.0)
MCV: 85.8 fL (ref 80.0–100.0)
Platelets: 261 10*3/uL (ref 150–400)
RBC: 3.67 MIL/uL — ABNORMAL LOW (ref 3.87–5.11)
RDW: 20.9 % — ABNORMAL HIGH (ref 11.5–15.5)
WBC: 5.3 10*3/uL (ref 4.0–10.5)
nRBC: 0 % (ref 0.0–0.2)

## 2022-10-28 LAB — GLUCOSE, CAPILLARY
Glucose-Capillary: 215 mg/dL — ABNORMAL HIGH (ref 70–99)
Glucose-Capillary: 229 mg/dL — ABNORMAL HIGH (ref 70–99)

## 2022-10-28 LAB — BRAIN NATRIURETIC PEPTIDE: B Natriuretic Peptide: 728.8 pg/mL — ABNORMAL HIGH (ref 0.0–100.0)

## 2022-10-28 MED ORDER — FUROSEMIDE 10 MG/ML IJ SOLN
20.0000 mg | Freq: Once | INTRAMUSCULAR | Status: AC
  Administered 2022-10-28: 20 mg via INTRAVENOUS
  Filled 2022-10-28: qty 4

## 2022-10-28 MED ORDER — DOCUSATE SODIUM 100 MG PO CAPS
100.0000 mg | ORAL_CAPSULE | Freq: Two times a day (BID) | ORAL | Status: DC
  Administered 2022-10-28 – 2022-10-30 (×4): 100 mg via ORAL
  Filled 2022-10-28 (×4): qty 1

## 2022-10-28 MED ORDER — ALBUTEROL SULFATE (2.5 MG/3ML) 0.083% IN NEBU
2.5000 mg | INHALATION_SOLUTION | RESPIRATORY_TRACT | Status: DC | PRN
  Administered 2022-10-29 – 2022-11-03 (×4): 2.5 mg via RESPIRATORY_TRACT
  Filled 2022-10-28 (×4): qty 3

## 2022-10-28 MED ORDER — ACETAMINOPHEN 650 MG RE SUPP: 650.0000 mg | Freq: Four times a day (QID) | RECTAL | Status: DC | PRN

## 2022-10-28 MED ORDER — METHYLPREDNISOLONE SODIUM SUCC 125 MG IJ SOLR
125.0000 mg | INTRAMUSCULAR | Status: AC
  Administered 2022-10-28: 125 mg via INTRAVENOUS
  Filled 2022-10-28: qty 2

## 2022-10-28 MED ORDER — LIDOCAINE 5 % EX PTCH
2.0000 | MEDICATED_PATCH | CUTANEOUS | Status: DC
  Administered 2022-10-29 – 2022-11-04 (×7): 2 via TRANSDERMAL
  Filled 2022-10-28 (×7): qty 2

## 2022-10-28 MED ORDER — OXYCODONE HCL 5 MG PO TABS
5.0000 mg | ORAL_TABLET | ORAL | Status: DC | PRN
  Administered 2022-10-30 – 2022-11-04 (×7): 5 mg via ORAL
  Filled 2022-10-28 (×9): qty 1

## 2022-10-28 MED ORDER — ACETAMINOPHEN 325 MG PO TABS
650.0000 mg | ORAL_TABLET | Freq: Four times a day (QID) | ORAL | Status: DC | PRN
  Administered 2022-10-29 – 2022-11-03 (×12): 650 mg via ORAL
  Filled 2022-10-28 (×12): qty 2

## 2022-10-28 MED ORDER — SODIUM CHLORIDE 0.9 % IV SOLN
2.0000 g | INTRAVENOUS | Status: DC
  Administered 2022-10-28: 2 g via INTRAVENOUS
  Filled 2022-10-28: qty 20

## 2022-10-28 MED ORDER — INSULIN ASPART 100 UNIT/ML IJ SOLN
0.0000 [IU] | Freq: Three times a day (TID) | INTRAMUSCULAR | Status: DC
  Administered 2022-10-28: 5 [IU] via SUBCUTANEOUS
  Administered 2022-10-29: 15 [IU] via SUBCUTANEOUS
  Administered 2022-10-29: 8 [IU] via SUBCUTANEOUS
  Administered 2022-10-29: 5 [IU] via SUBCUTANEOUS
  Administered 2022-10-30 (×2): 3 [IU] via SUBCUTANEOUS
  Administered 2022-10-30: 5 [IU] via SUBCUTANEOUS
  Administered 2022-10-31 (×2): 3 [IU] via SUBCUTANEOUS
  Administered 2022-11-01: 5 [IU] via SUBCUTANEOUS
  Administered 2022-11-01: 2 [IU] via SUBCUTANEOUS
  Administered 2022-11-01 – 2022-11-03 (×4): 3 [IU] via SUBCUTANEOUS
  Administered 2022-11-03: 2 [IU] via SUBCUTANEOUS
  Administered 2022-11-03: 3 [IU] via SUBCUTANEOUS
  Administered 2022-11-04: 2 [IU] via SUBCUTANEOUS

## 2022-10-28 MED ORDER — BISACODYL 5 MG PO TBEC: 5.0000 mg | DELAYED_RELEASE_TABLET | Freq: Every day | ORAL | Status: DC | PRN

## 2022-10-28 MED ORDER — ONDANSETRON HCL 4 MG/2ML IJ SOLN: 4.0000 mg | Freq: Four times a day (QID) | INTRAMUSCULAR | Status: DC | PRN

## 2022-10-28 MED ORDER — ASPIRIN 81 MG PO TBEC
81.0000 mg | DELAYED_RELEASE_TABLET | Freq: Every day | ORAL | Status: DC
  Administered 2022-10-29 – 2022-11-04 (×7): 81 mg via ORAL
  Filled 2022-10-28 (×7): qty 1

## 2022-10-28 MED ORDER — ATORVASTATIN CALCIUM 40 MG PO TABS
40.0000 mg | ORAL_TABLET | Freq: Every day | ORAL | Status: DC
  Administered 2022-10-29 – 2022-11-04 (×7): 40 mg via ORAL
  Filled 2022-10-28 (×7): qty 1

## 2022-10-28 MED ORDER — QUETIAPINE FUMARATE 25 MG PO TABS
12.5000 mg | ORAL_TABLET | Freq: Every evening | ORAL | Status: DC | PRN
  Administered 2022-10-28 – 2022-11-03 (×6): 12.5 mg via ORAL
  Filled 2022-10-28 (×6): qty 1

## 2022-10-28 MED ORDER — AMANTADINE HCL 100 MG PO CAPS
100.0000 mg | ORAL_CAPSULE | Freq: Every day | ORAL | Status: DC
  Administered 2022-10-29 – 2022-11-04 (×7): 100 mg via ORAL
  Filled 2022-10-28 (×7): qty 1

## 2022-10-28 MED ORDER — HYDRALAZINE HCL 20 MG/ML IJ SOLN
5.0000 mg | INTRAMUSCULAR | Status: DC | PRN
  Administered 2022-10-30 – 2022-10-31 (×2): 5 mg via INTRAVENOUS
  Filled 2022-10-28 (×3): qty 1

## 2022-10-28 MED ORDER — ONDANSETRON HCL 4 MG PO TABS: 4.0000 mg | ORAL_TABLET | Freq: Four times a day (QID) | ORAL | Status: DC | PRN

## 2022-10-28 MED ORDER — HYDRALAZINE HCL 50 MG PO TABS
100.0000 mg | ORAL_TABLET | Freq: Three times a day (TID) | ORAL | Status: DC
  Administered 2022-10-28 – 2022-11-04 (×18): 100 mg via ORAL
  Filled 2022-10-28 (×20): qty 2

## 2022-10-28 MED ORDER — ALBUTEROL SULFATE (2.5 MG/3ML) 0.083% IN NEBU
10.0000 mg/h | INHALATION_SOLUTION | Freq: Once | RESPIRATORY_TRACT | Status: AC
  Administered 2022-10-28: 10 mg/h via RESPIRATORY_TRACT
  Filled 2022-10-28: qty 12
  Filled 2022-10-28: qty 3

## 2022-10-28 MED ORDER — METHOCARBAMOL 500 MG PO TABS
250.0000 mg | ORAL_TABLET | Freq: Every evening | ORAL | Status: DC | PRN
  Administered 2022-10-29 – 2022-11-02 (×5): 250 mg via ORAL
  Filled 2022-10-28 (×7): qty 1

## 2022-10-28 MED ORDER — FUROSEMIDE 10 MG/ML IJ SOLN
40.0000 mg | Freq: Two times a day (BID) | INTRAMUSCULAR | Status: DC
  Administered 2022-10-28: 40 mg via INTRAVENOUS
  Filled 2022-10-28 (×2): qty 4

## 2022-10-28 MED ORDER — INSULIN GLARGINE-YFGN 100 UNIT/ML ~~LOC~~ SOLN
17.0000 [IU] | Freq: Two times a day (BID) | SUBCUTANEOUS | Status: DC
  Administered 2022-10-28: 17 [IU] via SUBCUTANEOUS
  Filled 2022-10-28 (×4): qty 0.17

## 2022-10-28 MED ORDER — SODIUM CHLORIDE 0.9% FLUSH
3.0000 mL | Freq: Two times a day (BID) | INTRAVENOUS | Status: DC
  Administered 2022-10-28 – 2022-11-04 (×14): 3 mL via INTRAVENOUS

## 2022-10-28 MED ORDER — ENOXAPARIN SODIUM 30 MG/0.3ML IJ SOSY
30.0000 mg | PREFILLED_SYRINGE | INTRAMUSCULAR | Status: DC
  Administered 2022-10-28 – 2022-10-30 (×3): 30 mg via SUBCUTANEOUS
  Filled 2022-10-28 (×4): qty 0.3

## 2022-10-28 MED ORDER — TRAZODONE HCL 50 MG PO TABS
25.0000 mg | ORAL_TABLET | Freq: Every evening | ORAL | Status: DC | PRN
  Filled 2022-10-28: qty 1

## 2022-10-28 MED ORDER — METOPROLOL TARTRATE 100 MG PO TABS
100.0000 mg | ORAL_TABLET | Freq: Two times a day (BID) | ORAL | Status: DC
  Administered 2022-10-28 – 2022-11-02 (×10): 100 mg via ORAL
  Filled 2022-10-28 (×10): qty 1

## 2022-10-28 MED ORDER — LORAZEPAM 2 MG/ML IJ SOLN
0.5000 mg | Freq: Once | INTRAMUSCULAR | Status: AC
  Administered 2022-10-28: 0.5 mg via INTRAVENOUS
  Filled 2022-10-28: qty 1

## 2022-10-28 MED ORDER — INSULIN ASPART 100 UNIT/ML IJ SOLN
0.0000 [IU] | Freq: Every day | INTRAMUSCULAR | Status: DC
  Administered 2022-10-28 – 2022-10-30 (×3): 2 [IU] via SUBCUTANEOUS

## 2022-10-28 MED ORDER — SENNOSIDES-DOCUSATE SODIUM 8.6-50 MG PO TABS
1.0000 | ORAL_TABLET | Freq: Two times a day (BID) | ORAL | Status: DC
  Administered 2022-10-28 – 2022-11-04 (×14): 1 via ORAL
  Filled 2022-10-28 (×14): qty 1

## 2022-10-28 MED ORDER — POLYETHYLENE GLYCOL 3350 17 G PO PACK
17.0000 g | PACK | Freq: Two times a day (BID) | ORAL | Status: DC | PRN
  Administered 2022-10-30 – 2022-10-31 (×2): 17 g via ORAL
  Filled 2022-10-28 (×2): qty 1

## 2022-10-28 MED ORDER — SODIUM CHLORIDE 0.9 % IV SOLN
500.0000 mg | INTRAVENOUS | Status: DC
  Administered 2022-10-28: 500 mg via INTRAVENOUS
  Filled 2022-10-28 (×2): qty 5

## 2022-10-28 MED ORDER — PANTOPRAZOLE SODIUM 40 MG PO TBEC
40.0000 mg | DELAYED_RELEASE_TABLET | Freq: Every day | ORAL | Status: DC
  Administered 2022-10-29 – 2022-11-04 (×7): 40 mg via ORAL
  Filled 2022-10-28 (×7): qty 1

## 2022-10-28 NOTE — Progress Notes (Signed)
Approximately 1604-- Pt arrived to [id number] and transferred to stretcher from pt bed. Pt drowsy and breathing irregularly, appearing short of breath. RT at bedside and placed pt on BIPAP. Pt has a personal wound vac attached for a sacral wound present. Difficult to assess pt's orientation d/t drowsiness. VS obtained and pt attached to telemetry. Pt's family arrived at bedside.

## 2022-10-28 NOTE — H&P (Signed)
History and Physical    Patient: [name] [name] VHQ:[id number] DOB: 1932-08-18 DOA: 10/28/2022 DOS: the patient was seen and examined on 10/28/2022 PCP: [name], MD  Patient coming from: Home - lives with son and DIL; NOK: Nyriah, [place], [phone]   Chief Complaint: SOB  HPI: [name] [name] is a 89 y.o. female with medical history significant of DM, CVA, HLD, stage 3b CKD, obesity, and HTN presenting with SOB. She was admitted from 7/12-26 for R gluteal abscess with wet gangrene and necrosis with MRSA coag negative Staph bacteremia.  She underwent I&D and extensive debridement with surgery, on Unasyn through 7/24.  She underwent hydrotherapy and was discharged to [facility] for rehab. During the hospitalization, she was taken off her usual Lasix after she developed AKI with creatinine up to 3.57 from baseline 1.4-1.8.  She was discharged from rehab to home with family 3 days ago and initially was doing well but subsequently developed SOB that was worsening through today.  Also with progressive LE edema.  She went to PM&R yesterday and had CXR ordered which showed mild LLL PNA; she was started on Augmentin and Azithromycin but came to the ER today when symptoms worsened.  She is DNR and would not agree to HD but is open to treating the treatable and to BIPAP if needed.    ER Course:  CHF exacerbation.  1 week with progressive SOB.  Hospitalized 1 month ago, told to dc Lasix.  LE edema, given nebs with EMS.  88% on RA, given Solumedrol, still wheezing.  Placing on BIPAP now.  Giving Ativan.     Review of Systems: As mentioned in the history of present illness. All other systems reviewed and are negative.  Markedly limited by sedation from Ativan and BIPAP. Past Medical History:  Diagnosis Date   Colon polyp    Diabetes mellitus 1992   Hypertension 1996   Pancreatitis    Past Surgical History:  Procedure Laterality Date   CHOLECYSTECTOMY     COLONOSCOPY     ERCP      INCISION AND DRAINAGE ABSCESS N/A 09/12/2022   Procedure: INCISION AND DRAINAGE ABSCESS;  Surgeon: Karie Soda, MD;  Location: [facility];  Service: General;  Laterality: N/A;   PARTIAL HYSTERECTOMY  89years old   POLYPECTOMY     UPPER GASTROINTESTINAL ENDOSCOPY     WRIST FRACTURE SURGERY  left arm,89years old   Social History:  reports that she has never smoked. She has never used smokeless tobacco. She reports that she does not drink alcohol and does not use drugs.  Allergies  Allergen Reactions   Amlodipine Other (See Comments)    EDEMA and "Allergic," per [facility]  States she can take.    Ace Inhibitors Other (See Comments) and Cough    "Allergic," per [facility]   Atorvastatin Other (See Comments)    Muscle & joint aches and "Allergic," per [facility]  States can take.    Canagliflozin Other (See Comments)    INVOKANA = urinary tract infections and "Allergic," per [facility]   Clonidine Hcl Other (See Comments)    "Allergic," per [facility]   Buprenorphine Other (See Comments)    "Allergic," per [facility]   Lisinopril Other (See Comments)    "Allergic," per [facility]   Morphine And Codeine Rash and Other (See Comments)    For long periods of time (??) and "Allergic," per [facility]    History reviewed. No pertinent family history.  Prior to Admission medications   Medication Sig  Start Date End Date Taking? Authorizing Provider  acetaminophen (TYLENOL) 500 MG tablet Take 1,000 mg by mouth 2 (two) times daily.   Yes [provider]  albuterol (VENTOLIN HFA) 108 (90 Base) MCG/ACT inhaler Inhale 2 puffs into the lungs every 6 (six) hours as needed for wheezing or shortness of breath.   Yes [provider]  amantadine (SYMMETREL) 100 MG capsule Take 1 capsule (100 mg total) by mouth daily. 02/12/20  Yes Love, Evlyn Kanner, PA-C  amLODipine (NORVASC) 2.5 MG tablet Take 2.5 mg by mouth daily.   Yes [provider]  amoxicillin-clavulanate (AUGMENTIN) 875-125 MG tablet Take 1 tablet by mouth 2 (two) times  daily. 10/27/22 11/06/22 Yes [provider]  ASPIRIN 81 PO Take 81 mg by mouth in the morning. 10/07/21  Yes [provider]  atorvastatin (LIPITOR) 40 MG tablet Take 1 tablet (40 mg total) by mouth daily. 04/13/20  Yes McCue, Shanda Bumps, NP  azithromycin (ZITHROMAX) 500 MG tablet Take 500 mg by mouth once. 10/27/22 10/30/22 Yes [provider]  B Complex-C (B-COMPLEX WITH VITAMIN C) tablet Take 1 tablet by mouth daily.   Yes [provider]  bisacodyl 5 MG EC tablet Take 5 mg by mouth daily as needed for moderate constipation.   Yes [provider]  diclofenac Sodium (VOLTAREN ARTHRITIS PAIN) 1 % GEL Apply 2 g topically 4 (four) times daily. Patient taking differently: Apply 4 g topically 3 (three) times daily. Right knee 04/24/22  Yes Raulkar, Drema Pry, MD  ferrous sulfate 325 (65 FE) MG tablet Take 325 mg by mouth every other day.   Yes [provider]  Glucerna (GLUCERNA) LIQD Take 237 mLs by mouth daily.   Yes [provider]  hydrALAZINE (APRESOLINE) 100 MG tablet Take 1 tablet (100 mg total) by mouth 3 (three) times daily. 02/12/20  Yes Love, Evlyn Kanner, PA-C  insulin lispro (HUMALOG) 100 UNIT/ML injection Inject 0-10 Units into the skin 4 (four) times daily.   Yes [provider]  LANTUS SOLOSTAR 100 UNIT/ML Solostar Pen Inject 10 Units into the skin 2 (two) times daily. Patient taking differently: Inject 17 Units into the skin 2 (two) times daily. 09/22/22  Yes Alekh, Kshitiz, MD  lidocaine (LIDODERM) 5 % APPLY (1) PATCH TO LOWER BACK AND RIGHT KNEE ONCE DAILY. LEAVE ON 12 HOURS. LEAVE OFF 12 HOURS. Patient taking differently: Place 1 patch onto the skin See admin instructions. Apply 1 patch to the lower back and right knee once a day- on 12 hours/off 12 hours 03/29/22  Yes Raulkar, Drema Pry, MD  methocarbamol (ROBAXIN) 500 MG tablet Take 250 mg by mouth at bedtime as needed for muscle spasms.   Yes [provider]   metoprolol tartrate (LOPRESSOR) 100 MG tablet Take 1 tablet (100 mg total) by mouth 2 (two) times daily. 02/12/20  Yes Love, Evlyn Kanner, PA-C  pantoprazole (PROTONIX) 40 MG tablet Take 1 tablet (40 mg total) by mouth daily. 02/12/20 10/28/22 Yes Love, Evlyn Kanner, PA-C  polyethylene glycol (MIRALAX / GLYCOLAX) 17 g packet Take 17 g by mouth 2 (two) times daily as needed for mild constipation or moderate constipation. 09/22/22  Yes Glade Lloyd, MD  QUEtiapine (SEROQUEL) 25 MG tablet Take 0.5 tablets (12.5 mg total) by mouth at bedtime as needed (anxiety). 03/23/20  Yes Jones Bales, NP  senna-docusate (SENOKOT-S) 8.6-50 MG tablet Take 1 tablet by mouth 2 (two) times daily.   Yes [provider]  sodium hypochlorite (DAKIN'S 1/4  STRENGTH) 0.125 % SOLN Irrigate with 1 Application as directed every Monday, Wednesday, and Friday.   Yes [provider]  traMADol (ULTRAM) 50 MG tablet Take 50 mg by mouth at bedtime as needed for moderate pain.   Yes [provider]  traMADol-acetaminophen (ULTRACET) 37.5-325 MG tablet Take 1 tablet by mouth 2 (two) times daily.   Yes [provider]  cefTRIAXone (ROCEPHIN) IVPB Inject into the vein once. Patient not taking: Reported on 10/28/2022    [provider]  Continuous Blood Gluc Receiver (DEXCOM G6 RECEIVER) DEVI 1 each by Does not apply route daily. 10/28/20   Raulkar, Drema Pry, MD  Continuous Blood Gluc Sensor (DEXCOM G6 SENSOR) MISC 1 each by Does not apply route daily. Patient taking differently: Inject 1 Device into the skin See admin instructions. Place 1 new device into the skin every 10 days 10/28/20   Raulkar, Drema Pry, MD  Continuous Blood Gluc Transmit (DEXCOM G6 TRANSMITTER) MISC 1 each by Does not apply route daily. 10/28/20   Raulkar, Drema Pry, MD  Insulin Syringe-Needle U-100 31G X 5/16" 1 ML MISC 1 application by Does not apply route 2 (two) times daily. 02/12/20   Jacquelynn Cree, PA-C    Physical  Exam: Vitals:   10/28/22 1445 10/28/22 1500 10/28/22 1553 10/28/22 1604  BP: (!) 172/64 (!) 138/53  (!) 182/66  Pulse: 73 70 81 75  Resp: 12 16 (!) 21 20  Temp:    98.7 F (37.1 C)  TempSrc:    Axillary  SpO2: 100% 100% 100% 100%   General:  Appears calm and comfortable and is in NAD, on BIPAP, somnolent Eyes:  normal lids, iris ENT:  grossly normal hearing, lips; BIPAP in place Neck:  no LAD, masses or thyromegaly Cardiovascular:  RRR, no m/r/g. 2-3+ taut LE edema.  Respiratory:   CTA bilaterally with no wheezes/rales/rhonchi.  Normal respiratory effort on BIPAP. Abdomen:  soft, NT, ND Skin:  no rash or induration seen on limited exam; chronic stasis dermatitis of LE Musculoskeletal:  grossly normal tone BUE/BLE, no bony abnormality Psychiatric:  blunted mood and affect, speech limited by sedation and BIPAP Neurologic:  unable to effectively perform   Radiological Exams on Admission: Independently reviewed - see discussion in A/P where applicable  DG Chest 2 View  Result Date: 10/28/2022 CLINICAL DATA:  Shortness of breath and wheezing. Recently diagnosed with pneumonia EXAM: CHEST - 2 VIEW COMPARISON:  09/16/2022 FINDINGS: AP and lateral views. Midline trachea. Mild cardiomegaly. Atherosclerosis in the transverse aorta. Small bilateral pleural effusions. No pneumothorax. Mild interstitial edema. No lobar consolidation. Numerous leads and wires project over the chest. Mild similar bibasilar atelectasis. IMPRESSION: Similar appearance of congestive heart failure and small bilateral pleural effusions. Aortic Atherosclerosis (ICD10-I70.0). Electronically Signed   By: Jeronimo Greaves M.D.   On: 10/28/2022 12:13    EKG: Independently reviewed.  NSR with rate 73; nonspecific ST changes with no evidence of acute ischemia   Labs on Admission: I have personally reviewed the available labs and imaging studies at the time of the admission.  Pertinent labs:    VBG: 7.33/61/37 Glucose  190 BUN 21/Creatinine 1.05/GFR 51 BNP 728.8 HS troponin 24, 23 WBC 5.3 Hgb 9.4   Assessment and Plan: Principal Problem:   Acute on chronic diastolic CHF (congestive heart failure) (HCC) Active Problems:   Dyslipidemia   Morbid obesity (HCC)   Uncontrolled type 2 diabetes mellitus with hyperglycemia (HCC)   Essential hypertension   Chronic kidney disease (  CKD), stage III (moderate) (HCC)   Gluteal abscess   Left lower lobe pneumonia   Acute on chronic diastolic CHF -Patient with known h/o chronic diastolic CHF presenting with worsening SOB -CXR consistent with pulmonary edema -Mildly elevated BNP without known baseline -With elevated BNP and abnl CXR, acute decompensated CHF seems probable as diagnosis -Will admit, as per the Emergency HF Mortality Risk Grade.  The patient has: severe pulmonary edema requiring BIPAP; cardiology requests admission to [facility] -Recent Echo (7/16) with preserved EF and grade 2 diastolic dysfunction -Will continue ASA -CHF order set utilized -Was given Lasix 40 mg x 1 in ER and will repeat with 40 mg IV BID -cardiology consulted -She was started on BIPAP in the ER, will continue but once more stable can transition to [place] O2 if tolerated -Mildly elevated HS troponin is likely related to demand ischemia; doubt ACS based on symptoms  R gluteal abscess with necrosis -s/p I&D, debridement -Has wound vac but there are concerns that this is not working -Wound care consulted  CAP -Current presentation is less consistent with PNA but she was seen by PCP yesterday and CXR with LLL PNA -Possibly complex presentation with both -Continue abx - hold Augmentin and PO Azithro and transition to Ceftriaxone and IV Azithro for now  Stage 3b CKD -Appears to be stable at this time -Attempt to avoid nephrotoxic medications -Recheck BMP in AM   HTN -Continue home meds - hydralazine, metoprolol -Will also add prn hydralazine  HLD -Continue Lipitor  DM -Last  A1c was 9.8, poor control -Continue glargine -Will cover with moderate-scale SSI for now  Morbid obesity -Increased morbidity/mortality risk -Weight loss should be encouraged -Outpatient PCP/bariatric medicine f/u encouraged   DNR -I have discussed code status with the patient's son; she would not desire resuscitation and would prefer to die a natural death should that situation arise. -She has a gold out of facility DNR form at the bedside      Advance Care Planning:   Code Status: Limited: Do not attempt resuscitation (DNR) -DNR-LIMITED -Do Not Intubate/DNI    Consults: Cardiology; CHF navigator; PT/OT; nutrition; TOC team  DVT Prophylaxis: Lovenox  Family Communication: Son was present throughout evaluation  Severity of Illness: The appropriate patient status for this patient is INPATIENT. Inpatient status is judged to be reasonable and necessary in order to provide the required intensity of service to ensure the patient's safety. The patient's presenting symptoms, physical exam findings, and initial radiographic and laboratory data in the context of their chronic comorbidities is felt to place them at high risk for further clinical deterioration. Furthermore, it is not anticipated that the patient will be medically stable for discharge from the hospital within 2 midnights of admission.   * I certify that at the point of admission it is my clinical judgment that the patient will require inpatient hospital care spanning beyond 2 midnights from the point of admission due to high intensity of service, high risk for further deterioration and high frequency of surveillance required.*  Author: Jonah Blue, MD 10/28/2022 4:26 PM  For on call review [email or web address].

## 2022-10-28 NOTE — ED Triage Notes (Signed)
Pt from home via [facility] with reports of SHOB. Pt was given 2 duonebs en route. Ems reports pt recently dx with pneumonia.

## 2022-10-28 NOTE — ED Provider Notes (Signed)
[facility] AT [facility] Provider Note   CSN: [id number] Arrival date & time: 10/28/22  1049     History  Chief Complaint  Patient presents with   Shortness of Breath    [name] [name] is a 89 y.o. female with medical history of hypertension, diabetes, pancreatitis, nontraumatic acute hemorrhage of basal ganglia.  Patient presents to ED for evaluation of shortness of breath.  Patient states that for the last 1 week she has felt progressively more short of breath.  She states that she has a history of asthma and has been spending a lot of time outside recently.  She reports that she went to her PCPs office "last night" and was diagnosed with pneumonia and "given a shot".  She denies being sent home with oral antibiotics.  EMS was called to her house this morning due to her shortness of breath.  On EMS arrival, the patient was noted to be wheezing and hypoxic.  Patient was placed on oxygen, given 2 duo nebulizers en route.  On arrival, the patient continues to have full-field wheezing.  Oxygen saturation at rest is 97% while sitting in bed.  Patient denies chest pain.  She is endorsing leg swelling that she states began after she was discharged in the hospital.  She was discharged in the hospital 1 month ago on 7/26.  Patient reports that she has not taken her Lasix since this time she was advised not to.  She denies nausea, vomiting or fevers at home.   Shortness of Breath Associated symptoms: cough and wheezing   Associated symptoms: no abdominal pain, no chest pain, no fever and no vomiting        Home Medications Prior to Admission medications   Medication Sig Start Date End Date Taking? Authorizing Provider  acetaminophen (TYLENOL) 500 MG tablet Take 1,000 mg by mouth 2 (two) times daily.   Yes [provider]  albuterol (VENTOLIN HFA) 108 (90 Base) MCG/ACT inhaler Inhale 2 puffs into the lungs every 6 (six) hours as needed for wheezing or  shortness of breath.   Yes [provider]  amantadine (SYMMETREL) 100 MG capsule Take 1 capsule (100 mg total) by mouth daily. 02/12/20  Yes Love, Evlyn Kanner, PA-C  amLODipine (NORVASC) 2.5 MG tablet Take 2.5 mg by mouth daily.   Yes [provider]  amoxicillin-clavulanate (AUGMENTIN) 875-125 MG tablet Take 1 tablet by mouth 2 (two) times daily. 10/27/22 11/06/22 Yes [provider]  ASPIRIN 81 PO Take 81 mg by mouth in the morning. 10/07/21  Yes [provider]  atorvastatin (LIPITOR) 40 MG tablet Take 1 tablet (40 mg total) by mouth daily. 04/13/20  Yes McCue, Shanda Bumps, NP  azithromycin (ZITHROMAX) 500 MG tablet Take 500 mg by mouth once. 10/27/22 10/30/22 Yes [provider]  B Complex-C (B-COMPLEX WITH VITAMIN C) tablet Take 1 tablet by mouth daily.   Yes [provider]  bisacodyl 5 MG EC tablet Take 5 mg by mouth daily as needed for moderate constipation.   Yes [provider]  diclofenac Sodium (VOLTAREN ARTHRITIS PAIN) 1 % GEL Apply 2 g topically 4 (four) times daily. Patient taking differently: Apply 4 g topically 3 (three) times daily. Right knee 04/24/22  Yes Raulkar, Drema Pry, MD  ferrous sulfate 325 (65 FE) MG tablet Take 325 mg by mouth every other day.   Yes [provider]  Glucerna (GLUCERNA) LIQD Take 237 mLs by mouth daily.   Yes [provider]  hydrALAZINE (APRESOLINE) 100 MG tablet Take 1 tablet (100 mg total) by mouth 3 (three) times daily. 02/12/20  Yes Love, Evlyn Kanner, PA-C  insulin lispro (HUMALOG) 100 UNIT/ML injection Inject 0-10 Units into the skin 4 (four) times daily.   Yes [provider]  LANTUS SOLOSTAR 100 UNIT/ML Solostar Pen Inject 10 Units into the skin 2 (two) times daily. Patient taking differently: Inject 17 Units into the skin 2 (two) times daily. 09/22/22  Yes Alekh, Kshitiz, MD  lidocaine (LIDODERM) 5 % APPLY (1) PATCH TO LOWER BACK AND RIGHT KNEE ONCE DAILY. LEAVE ON 12 HOURS.  LEAVE OFF 12 HOURS. Patient taking differently: Place 1 patch onto the skin See admin instructions. Apply 1 patch to the lower back and right knee once a day- on 12 hours/off 12 hours 03/29/22  Yes Raulkar, Drema Pry, MD  methocarbamol (ROBAXIN) 500 MG tablet Take 250 mg by mouth at bedtime as needed for muscle spasms.   Yes [provider]  metoprolol tartrate (LOPRESSOR) 100 MG tablet Take 1 tablet (100 mg total) by mouth 2 (two) times daily. 02/12/20  Yes Love, Evlyn Kanner, PA-C  pantoprazole (PROTONIX) 40 MG tablet Take 1 tablet (40 mg total) by mouth daily. 02/12/20 10/28/22 Yes Love, Evlyn Kanner, PA-C  polyethylene glycol (MIRALAX / GLYCOLAX) 17 g packet Take 17 g by mouth 2 (two) times daily as needed for mild constipation or moderate constipation. 09/22/22  Yes Glade Lloyd, MD  QUEtiapine (SEROQUEL) 25 MG tablet Take 0.5 tablets (12.5 mg total) by mouth at bedtime as needed (anxiety). 03/23/20  Yes Jones Bales, NP  senna-docusate (SENOKOT-S) 8.6-50 MG tablet Take 1 tablet by mouth 2 (two) times daily.   Yes [provider]  sodium hypochlorite (DAKIN'S 1/4 STRENGTH) 0.125 % SOLN Irrigate with 1 Application as directed every Monday, Wednesday, and Friday.   Yes [provider]  traMADol (ULTRAM) 50 MG tablet Take 50 mg by mouth at bedtime as needed for moderate pain.   Yes [provider]  traMADol-acetaminophen (ULTRACET) 37.5-325 MG tablet Take 1 tablet by mouth 2 (two) times daily.   Yes [provider]  cefTRIAXone (ROCEPHIN) IVPB Inject into the vein once. Patient not taking: Reported on 10/28/2022    [provider]  Continuous Blood Gluc Receiver (DEXCOM G6 RECEIVER) DEVI 1 each by Does not apply route daily. 10/28/20   Raulkar, Drema Pry, MD  Continuous Blood Gluc Sensor (DEXCOM G6 SENSOR) MISC 1 each by Does not apply route daily. Patient taking differently: Inject 1 Device into the skin See admin instructions. Place 1 new device into  the skin every 10 days 10/28/20   Raulkar, Drema Pry, MD  Continuous Blood Gluc Transmit (DEXCOM G6 TRANSMITTER) MISC 1 each by Does not apply route daily. 10/28/20   Raulkar, Drema Pry, MD  Insulin Syringe-Needle U-100 31G X 5/16" 1 ML MISC 1 application by Does not apply route 2 (two) times daily. 02/12/20   Love, Evlyn Kanner, PA-C      Allergies    Amlodipine, Ace inhibitors, Atorvastatin, Canagliflozin, Clonidine hcl, Buprenorphine, Lisinopril, and Morphine and codeine    Review of Systems   Review of Systems  Constitutional:  Negative for fever.  Respiratory:  Positive for cough, shortness of breath and wheezing.   Cardiovascular:  Positive for leg swelling. Negative for chest pain.  Gastrointestinal:  Negative for abdominal pain, nausea and vomiting.  Genitourinary:  Negative for dysuria.  Neurological:  Positive for light-headedness.  All  other systems reviewed and are negative.   Physical Exam Updated Vital Signs BP (!) 125/114   Pulse 78   Temp 97.7 F (36.5 C) (Oral)   Resp 17   SpO2 100%  Physical Exam Vitals and nursing note reviewed.  Constitutional:      General: She is not in acute distress.    Appearance: Normal appearance. She is not ill-appearing, toxic-appearing or diaphoretic.  HENT:     Head: Normocephalic and atraumatic.     Nose: Nose normal.     Mouth/Throat:     Mouth: Mucous membranes are moist.     Pharynx: Oropharynx is clear.  Eyes:     Extraocular Movements: Extraocular movements intact.     Conjunctiva/sclera: Conjunctivae normal.     Pupils: Pupils are equal, round, and reactive to light.  Cardiovascular:     Rate and Rhythm: Normal rate and regular rhythm.  Pulmonary:     Effort: Pulmonary effort is normal.     Breath sounds: Wheezing present.  Abdominal:     General: Abdomen is flat. Bowel sounds are normal.     Palpations: Abdomen is soft.     Tenderness: There is no abdominal tenderness.  Musculoskeletal:     Cervical back: Normal range  of motion and neck supple. No tenderness.     Right lower leg: Edema present.     Left lower leg: Edema present.  Skin:    General: Skin is warm and dry.     Capillary Refill: Capillary refill takes less than 2 seconds.  Neurological:     General: No focal deficit present.     Mental Status: She is alert and oriented to person, place, and time.     ED Results / Procedures / Treatments   Labs (all labs ordered are listed, but only abnormal results are displayed) Labs Reviewed  CBC - Abnormal; Notable for the following components:      Result Value   RBC 3.67 (*)    Hemoglobin 9.4 (*)    HCT 31.5 (*)    MCH 25.6 (*)    MCHC 29.8 (*)    RDW 20.9 (*)    All other components within normal limits  BASIC METABOLIC PANEL - Abnormal; Notable for the following components:   Glucose, Bld 190 (*)    Creatinine, Ser 1.05 (*)    Calcium 7.7 (*)    GFR, Estimated 51 (*)    All other components within normal limits  BRAIN NATRIURETIC PEPTIDE - Abnormal; Notable for the following components:   B Natriuretic Peptide 728.8 (*)    All other components within normal limits  BLOOD GAS, VENOUS - Abnormal; Notable for the following components:   pCO2, Ven 61 (*)    Bicarbonate 32.2 (*)    Acid-Base Excess 5.1 (*)    All other components within normal limits  TROPONIN I (HIGH SENSITIVITY) - Abnormal; Notable for the following components:   Troponin I (High Sensitivity) 24 (*)    All other components within normal limits  TROPONIN I (HIGH SENSITIVITY) - Abnormal; Notable for the following components:   Troponin I (High Sensitivity) 23 (*)    All other components within normal limits    EKG EKG Interpretation Date/Time:  Saturday October 28 2022 11:11:34 EDT Ventricular Rate:  73 PR Interval:  153 QRS Duration:  97 QT Interval:  414 QTC Calculation: 457 R Axis:   59  Text Interpretation: Sinus rhythm Low voltage, precordial leads Borderline repolarization abnormality Confirmed  by  Kristine Royal [phone]) on 10/28/2022 11:39:13 AM  Radiology DG Chest 2 View  Result Date: 10/28/2022 CLINICAL DATA:  Shortness of breath and wheezing. Recently diagnosed with pneumonia EXAM: CHEST - 2 VIEW COMPARISON:  09/16/2022 FINDINGS: AP and lateral views. Midline trachea. Mild cardiomegaly. Atherosclerosis in the transverse aorta. Small bilateral pleural effusions. No pneumothorax. Mild interstitial edema. No lobar consolidation. Numerous leads and wires project over the chest. Mild similar bibasilar atelectasis. IMPRESSION: Similar appearance of congestive heart failure and small bilateral pleural effusions. Aortic Atherosclerosis (ICD10-I70.0). Electronically Signed   By: Jeronimo Greaves M.D.   On: 10/28/2022 12:13    Procedures .Critical Care  Performed by: Al Decant, PA-C Authorized by: Al Decant, PA-C   Critical care provider statement:    Critical care time (minutes):  75   Critical care time was exclusive of:  Separately billable procedures and treating other patients   Critical care was necessary to treat or prevent imminent or life-threatening deterioration of the following conditions:  Respiratory failure   Critical care was time spent personally by me on the following activities:  Blood draw for specimens, development of treatment plan with patient or surrogate, discussions with primary provider, discussions with consultants, evaluation of patient's response to treatment, examination of patient, interpretation of cardiac output measurements, obtaining history from patient or surrogate, review of old charts, re-evaluation of patient's condition, pulse oximetry, ordering and review of radiographic studies, ordering and review of laboratory studies and ordering and performing treatments and interventions   I assumed direction of critical care for this patient from another provider in my specialty: no     Care discussed with: admitting provider      Medications  Ordered in ED Medications  albuterol (PROVENTIL) (2.5 MG/3ML) 0.083% nebulizer solution (10 mg/hr Nebulization Given 10/28/22 1135)  methylPREDNISolone sodium succinate (SOLU-MEDROL) 125 mg/2 mL injection 125 mg (125 mg Intravenous Given 10/28/22 1134)  furosemide (LASIX) injection 20 mg (20 mg Intravenous Given 10/28/22 1134)  furosemide (LASIX) injection 20 mg (20 mg Intravenous Given 10/28/22 1330)  LORazepam (ATIVAN) injection 0.5 mg (0.5 mg Intravenous Given 10/28/22 1356)    ED Course/ Medical Decision Making/ A&P Clinical Course as of 10/28/22 1437  Sat Oct 28, 2022  1119 Rocephin IM, azith and augmentin [CG]  1120 60 to 65% in July [CG]    Clinical Course User Index [CG] Al Decant, PA-C   Medical Decision Making Amount and/or Complexity of Data Reviewed Labs: ordered. Radiology: ordered.  Risk Prescription drug management.   89 year old female presents to the ED for evaluation.  Please see HPI for further details.  On examination patient is afebrile and nontachycardic.  The patient is audibly wheezing, she is hypoxic to 89% on room air.  Abdomen is soft and compressible throughout.  Neurological examination at baseline, alert and oriented.  The patient does have 2+ pitting edema bilaterally to her lower extremities.  Patient initially provided 125 Solu-Medrol, continuous DuoNeb at 10 mg/h.  Patient also provided 20 mg Lasix IV.  Will collect CBC, BMP, BNP, troponin, chest x-ray, EKG, VBG.  Patient CBC shows no leukocytosis, hemoglobin 9.4 which is baseline.  The patient metabolic panel shows baseline creatinine, no electrolyte derangement, glucose 198, anion gap 9.  Troponin 24, 23 with delta.  BNP elevated to 728.  VBG shows elevated bicarb, elevated pCO2.  Chest x-ray shows small bilateral pleural effusions, no consolidations.  EKG nonischemic.  Patient provided another 20 mg of Lasix.  On reassessment after  continuous duo nebulizer patient continues to wheeze.   Patient oxygen saturation 100%.  Discussed with attending, we will transition her to BiPAP at this time.  Will provide 0.5 mg of Ativan.  Discussed patient case with Dr. Ophelia Charter, [facility] hospitalist, who has agreed to admit patient due to new oxygen requirement as well as CHF exacerbation.   Final Clinical Impression(s) / ED Diagnoses Final diagnoses:  Acute on chronic congestive heart failure, unspecified heart failure type [facility])  Hypoxia    Rx / DC Orders ED Discharge Orders     None         Clent Ridges 10/28/22 1438    Wynetta Fines, MD 10/28/22 [phone]

## 2022-10-28 NOTE — Progress Notes (Signed)
   10/28/22 1408  BiPAP/CPAP/SIPAP  BiPAP/CPAP/SIPAP Pt Type Adult  BiPAP/CPAP/SIPAP V60 (Pt is tolerating the bipap at this time, son at bedside.)  Mask Type Full face mask  Mask Size Medium  Set Rate (S)  18 breaths/min  IPAP (S)  10 cmH20  EPAP (S)  5 cmH2O  FiO2 (%) (S)  35 %  Flow Rate 1 lpm  Minute Ventilation 8.4  Leak 0  Peak Inspiratory Pressure (PIP) 16  Tidal Volume (Vt) 465  Patient Home Equipment No  Auto Titrate No  Press High Alarm 45 cmH2O  Press Low Alarm 8 cmH2O  Nasal massage performed No (comment)  CPAP/SIPAP surface wiped down Yes  Oxygen Percent 35 %  BiPAP/CPAP /SiPAP Vitals  Pulse Rate 74  Resp 16  SpO2 100 %  Bilateral Breath Sounds Diminished;Fine crackles  MEWS Score/Color  MEWS Score 0  MEWS Score Color Green

## 2022-10-28 NOTE — Progress Notes (Signed)
   10/28/22 1345  BiPAP/CPAP/SIPAP  $ Non-Invasive Ventilator  Non-Invasive Vent Set Up;Non-Invasive Vent Initial  $ Face Mask Medium Yes  BiPAP/CPAP/SIPAP Pt Type Adult  BiPAP/CPAP/SIPAP (S)  V60 (Pt couldn't tolerate the bipap at this time, she stated she couldn't breathe, she felt like she was suffocating, son at the bedside.  I reassured the pt and placed her back on [address] [place].  Notified RN and PA.)  Mask Type Full face mask  Mask Size Medium  Set Rate (S)  16 breaths/min  IPAP (S)  10 cmH20  EPAP (S)  5 cmH2O  FiO2 (%) (S)  35 %  BiPAP/CPAP /SiPAP Vitals  Pulse Rate 78  Resp 17  SpO2 100 %  MEWS Score/Color  MEWS Score 0  MEWS Score Color Green

## 2022-10-28 NOTE — ED Notes (Signed)
[facility] called. 

## 2022-10-28 NOTE — Plan of Care (Signed)
  Problem: Education: Goal: Knowledge of General Education information will improve Description: Including pain rating scale, medication(s)/side effects and non-pharmacologic comfort measures Outcome: Progressing   Problem: Clinical Measurements: Goal: Will remain free from infection Outcome: Progressing   Problem: Activity: Goal: Risk for activity intolerance will decrease Outcome: Not Progressing   Problem: Nutrition: Goal: Adequate nutrition will be maintained Outcome: Not Progressing

## 2022-10-28 NOTE — Consult Note (Signed)
Cardiology Consultation   Patient ID: ABRYANA [name] MRN: [id number]; DOB: 08/05/1932  Admit date: 10/28/2022 Date of Consult: 10/28/2022  PCP:  [name] Harries, MD   [facility] Providers Cardiologist:  None        Patient Profile:   [name] [name] is a 89 y.o. female with a hx of hypertension, diabetes who is being seen 10/28/2022 for the evaluation of diastolic heart failure exacerbation at the request of [name] [name].  History of Present Illness:   History from chart review as patient is on BiPAP.  [name] [name] with a past history of diabetes, CVA, hyperlipidemia, CKD stage IIIb, obesity, hypertension who presented to the emergency room with shortness of breath.  She was discharged from the hospital with a gluteal abscess and wet gangrene with MRSA coag negative staph bacteremia.  She underwent an I&D with surgery and has been on Unasyn.  During her hospitalization, she was taken off of her IC Lasix due to acute renal failure.  She was discharged from rehab to home 3 days ago.  She has been having progressive shortness of breath with significant lower extremity edema.  She had a chest x-ray yesterday that was concerning for left lower lobe pneumonia and was started on Augmentin and azithromycin.  She presented to the emergency room due to worsening symptoms.  She was started on BiPAP.  She was significantly hypoxic on presentation in the emergency room, but this has improved on BiPAP.  She has received Lasix IV though no charted urine output.   Past Medical History:  Diagnosis Date   Colon polyp    Diabetes mellitus 1992   Hypertension 1996   Pancreatitis     Past Surgical History:  Procedure Laterality Date   CHOLECYSTECTOMY     COLONOSCOPY     ERCP     INCISION AND DRAINAGE ABSCESS N/A 09/12/2022   Procedure: INCISION AND DRAINAGE ABSCESS;  Surgeon: [name] Soda, MD;  Location: [facility];  Service: General;  Laterality: N/A;   PARTIAL HYSTERECTOMY  89years old    POLYPECTOMY     UPPER GASTROINTESTINAL ENDOSCOPY     WRIST FRACTURE SURGERY  left arm,89years old     Home Medications:  Prior to Admission medications   Medication Sig Start Date End Date Taking? Authorizing Provider  acetaminophen (TYLENOL) 500 MG tablet Take 1,000 mg by mouth 2 (two) times daily.   Yes [provider]  albuterol (VENTOLIN HFA) 108 (90 Base) MCG/ACT inhaler Inhale 2 puffs into the lungs every 6 (six) hours as needed for wheezing or shortness of breath.   Yes [provider]  amantadine (SYMMETREL) 100 MG capsule Take 1 capsule (100 mg total) by mouth daily. 02/12/20  Yes Love, [name] Kanner, PA-C  amLODipine (NORVASC) 2.5 MG tablet Take 2.5 mg by mouth daily.   Yes [provider]  amoxicillin-clavulanate (AUGMENTIN) 875-125 MG tablet Take 1 tablet by mouth 2 (two) times daily. 10/27/22 11/06/22 Yes [provider]  ASPIRIN 81 PO Take 81 mg by mouth in the morning. 10/07/21  Yes [provider]  atorvastatin (LIPITOR) 40 MG tablet Take 1 tablet (40 mg total) by mouth daily. 04/13/20  Yes McCue, [name] Bumps, NP  azithromycin (ZITHROMAX) 500 MG tablet Take 500 mg by mouth once. 10/27/22 10/30/22 Yes [provider]  B Complex-C (B-COMPLEX WITH VITAMIN C) tablet Take 1 tablet by mouth daily.   Yes [provider]  bisacodyl 5 MG EC tablet Take 5 mg by mouth daily  as needed for moderate constipation.   Yes [provider]  diclofenac Sodium (VOLTAREN ARTHRITIS PAIN) 1 % GEL Apply 2 g topically 4 (four) times daily. Patient taking differently: Apply 4 g topically 3 (three) times daily. Right knee 04/24/22  Yes [name] [name], [name] Pry, MD  ferrous sulfate 325 (65 FE) MG tablet Take 325 mg by mouth every other day.   Yes [provider]  Glucerna (GLUCERNA) LIQD Take 237 mLs by mouth daily.   Yes [provider]  hydrALAZINE (APRESOLINE) 100 MG tablet Take 1 tablet (100 mg total) by mouth 3 (three) times daily.  02/12/20  Yes Love, [name] Kanner, PA-C  insulin lispro (HUMALOG) 100 UNIT/ML injection Inject 0-10 Units into the skin 4 (four) times daily.   Yes [provider]  LANTUS SOLOSTAR 100 UNIT/ML Solostar Pen Inject 10 Units into the skin 2 (two) times daily. Patient taking differently: Inject 17 Units into the skin 2 (two) times daily. 09/22/22  Yes [name], Kshitiz, MD  lidocaine (LIDODERM) 5 % APPLY (1) PATCH TO LOWER BACK AND RIGHT KNEE ONCE DAILY. LEAVE ON 12 HOURS. LEAVE OFF 12 HOURS. Patient taking differently: Place 1 patch onto the skin See admin instructions. Apply 1 patch to the lower back and right knee once a day- on 12 hours/off 12 hours 03/29/22  Yes [name] [name], [name] Pry, MD  methocarbamol (ROBAXIN) 500 MG tablet Take 250 mg by mouth at bedtime as needed for muscle spasms.   Yes [provider]  metoprolol tartrate (LOPRESSOR) 100 MG tablet Take 1 tablet (100 mg total) by mouth 2 (two) times daily. 02/12/20  Yes Love, [name] Kanner, PA-C  pantoprazole (PROTONIX) 40 MG tablet Take 1 tablet (40 mg total) by mouth daily. 02/12/20 10/28/22 Yes Love, [name] Kanner, PA-C  polyethylene glycol (MIRALAX / GLYCOLAX) 17 g packet Take 17 g by mouth 2 (two) times daily as needed for mild constipation or moderate constipation. 09/22/22  Yes [name] Lloyd, MD  QUEtiapine (SEROQUEL) 25 MG tablet Take 0.5 tablets (12.5 mg total) by mouth at bedtime as needed (anxiety). 03/23/20  Yes [name] Bales, NP  senna-docusate (SENOKOT-S) 8.6-50 MG tablet Take 1 tablet by mouth 2 (two) times daily.   Yes [provider]  sodium hypochlorite (DAKIN'S 1/4 STRENGTH) 0.125 % SOLN Irrigate with 1 Application as directed every Monday, Wednesday, and Friday.   Yes [provider]  traMADol (ULTRAM) 50 MG tablet Take 50 mg by mouth at bedtime as needed for moderate pain.   Yes [provider]  traMADol-acetaminophen (ULTRACET) 37.5-325 MG tablet Take 1 tablet by mouth 2 (two) times daily.   Yes  [provider]  Continuous Blood Gluc Receiver (DEXCOM G6 RECEIVER) DEVI 1 each by Does not apply route daily. 10/28/20   [name] [name], [name] Pry, MD  Continuous Blood Gluc Sensor (DEXCOM G6 SENSOR) MISC 1 each by Does not apply route daily. Patient taking differently: Inject 1 Device into the skin See admin instructions. Place 1 new device into the skin every 10 days 10/28/20   [name] [name], [name] Pry, MD  Continuous Blood Gluc Transmit (DEXCOM G6 TRANSMITTER) MISC 1 each by Does not apply route daily. 10/28/20   [name] [name], [name] Pry, MD  Insulin Syringe-Needle U-100 31G X 5/16" 1 ML MISC 1 application by Does not apply route 2 (two) times daily. 02/12/20   Jacquelynn Cree, PA-C    Inpatient Medications: Scheduled Meds:  [START ON 10/29/2022] amantadine  100 mg Oral Daily   [START ON 10/29/2022] aspirin  EC  81 mg Oral q AM   [START ON 10/29/2022] atorvastatin  40 mg Oral Daily   docusate sodium  100 mg Oral BID   enoxaparin (LOVENOX) injection  30 mg Subcutaneous Q24H   furosemide  40 mg Intravenous BID   hydrALAZINE  100 mg Oral TID   [START ON 10/29/2022] insulin aspart  0-15 Units Subcutaneous TID WC   insulin aspart  0-5 Units Subcutaneous QHS   insulin glargine-yfgn  17 Units Subcutaneous BID   lidocaine  1 patch Transdermal See admin instructions   metoprolol tartrate  100 mg Oral BID   [START ON 10/29/2022] pantoprazole  40 mg Oral Daily   senna-docusate  1 tablet Oral BID   sodium chloride flush  3 mL Intravenous Q12H   Continuous Infusions:  azithromycin     cefTRIAXone (ROCEPHIN)  IV     PRN Meds: acetaminophen **OR** acetaminophen, bisacodyl, hydrALAZINE, methocarbamol, ondansetron **OR** ondansetron (ZOFRAN) IV, oxyCODONE, polyethylene glycol, QUEtiapine, traZODone  Allergies:    Allergies  Allergen Reactions   Amlodipine Other (See Comments)    EDEMA and "Allergic," per [facility]  States she can take.    Ace Inhibitors Other (See Comments) and Cough    "Allergic," per MAR    Atorvastatin Other (See Comments)    Muscle & joint aches and "Allergic," per [facility]  States can take.    Canagliflozin Other (See Comments)    INVOKANA = urinary tract infections and "Allergic," per MAR   Clonidine Hcl Other (See Comments)    "Allergic," per MAR   Buprenorphine Other (See Comments)    "Allergic," per MAR   Lisinopril Other (See Comments)    "Allergic," per MAR   Morphine And Codeine Rash and Other (See Comments)    For long periods of time (??) and "Allergic," per [facility]    Social History:   Social History   Socioeconomic History   Marital status: Widowed    Spouse name: Not on file   Number of children: Not on file   Years of education: Not on file   Highest education level: Not on file  Occupational History   Not on file  Tobacco Use   Smoking status: Never   Smokeless tobacco: Never  Vaping Use   Vaping status: Never Used  Substance and Sexual Activity   Alcohol use: Never   Drug use: Never   Sexual activity: Not on file  Other Topics Concern   Not on file  Social History Narrative   Not on file   Social Determinants of Health   Financial Resource Strain: Low Risk  (06/14/2022)   Received from [facility], [facility]   Overall Financial Resource Strain (CARDIA)    Difficulty of Paying Living Expenses: Not hard at all  Food Insecurity: No Food Insecurity (09/08/2022)   Hunger Vital Sign    Worried About Running Out of Food in the Last Year: Never true    Ran Out of Food in the Last Year: Never true  Transportation Needs: No Transportation Needs (09/08/2022)   PRAPARE - Administrator, Civil Service (Medical): No    Lack of Transportation (Non-Medical): No  Physical Activity: Inactive (05/19/2019)   Received from [facility], [facility]   Exercise Vital Sign    Days of Exercise per Week: 0 days    Minutes of Exercise per Session: 0 min  Stress: No Stress Concern Present (12/05/2021)   Received from [facility], [facility]    [place]  [employer] - Occupational Stress Questionnaire    Feeling of Stress : Not at all  Social Connections: Socially Integrated (12/05/2021)   Received from [facility], [facility]   Social Network    How would you rate your social network (family, work, friends)?: Good participation with social networks  Intimate Partner Violence: Not At Risk (09/08/2022)   Humiliation, Afraid, Rape, and Kick questionnaire    Fear of Current or Ex-Partner: No    Emotionally Abused: No    Physically Abused: No    Sexually Abused: No    Family History:   History reviewed. No pertinent family history.   ROS:  Please see the history of present illness.   All other ROS reviewed and negative.     Physical Exam/Data:   Vitals:   10/28/22 1445 10/28/22 1500 10/28/22 1553 10/28/22 1604  BP: (!) 172/64 (!) 138/53  (!) 182/66  Pulse: 73 70 81 75  Resp: 12 16 (!) 21 20  Temp:    98.7 F (37.1 C)  TempSrc:    Axillary  SpO2: 100% 100% 100% 100%   No intake or output data in the 24 hours ending 10/28/22 1611    09/22/2022    6:06 AM 09/20/2022    5:00 AM 09/18/2022    5:25 AM  Last 3 Weights  Weight (lbs) 295 lb 304 lb 299 lb 13.2 oz  Weight (kg) 133.811 kg 137.893 kg 136 kg     There is no height or weight on file to calculate BMI.  General:  Well nourished, well developed, on BiPAP HEENT: normal Cardiac:  normal S1, S2; RRR; no murmur  Lungs: Crackles bilaterally Abd: soft, nontender, no hepatomegaly  Ext: 2+ Musculoskeletal:  No deformities, BUE and BLE strength normal and equal Skin: warm and dry  Neuro:  CNs 2-12 intact, no focal abnormalities noted Psych:  Normal affect   EKG:  The EKG was personally reviewed and demonstrates: Sinus rhythm Telemetry:  Telemetry was personally reviewed and demonstrates: Sinus rhythm  Relevant CV Studies: TTE 09/12/2022  1. Left ventricular ejection fraction, by estimation, is 60 to 65%. The  left ventricle has normal  function. The left ventricle has no regional  wall motion abnormalities. There is mild concentric left ventricular  hypertrophy. Left ventricular diastolic  parameters are consistent with Grade II diastolic dysfunction  (pseudonormalization).   2. Right ventricular systolic function is normal. The right ventricular  size is normal. There is mildly elevated pulmonary artery systolic  pressure. The estimated right ventricular systolic pressure is 39.8 mmHg.   3. Left atrial size was moderately dilated.   4. Right atrial size was mild to moderately dilated.   5. The mitral valve is degenerative. Mild mitral valve regurgitation. No  evidence of mitral stenosis. Moderate mitral annular calcification.   6. The aortic valve is tricuspid. There is mild calcification of the  aortic valve. Aortic valve regurgitation is trivial. Aortic valve  sclerosis/calcification is present, without any evidence of aortic  stenosis.   7. The inferior vena cava is dilated in size with >50% respiratory  variability, suggesting right atrial pressure of 8 mmHg.   Laboratory Data:  High Sensitivity Troponin:   Recent Labs  Lab 10/28/22 1148 10/28/22 1337  TROPONINIHS 24* 23*     Chemistry Recent Labs  Lab 10/28/22 1147  NA 139  K 4.2  CL 104  CO2 26  GLUCOSE 190*  BUN 21  CREATININE 1.05*  CALCIUM 7.7*  GFRNONAA 51*  ANIONGAP 9    No results for input(s): "PROT", "ALBUMIN", "AST", "ALT", "ALKPHOS", "BILITOT" in the last 168 hours. Lipids No results for input(s): "CHOL", "TRIG", "HDL", "LABVLDL", "LDLCALC", "CHOLHDL" in the last 168 hours.  Hematology Recent Labs  Lab 10/28/22 1147  WBC 5.3  RBC 3.67*  HGB 9.4*  HCT 31.5*  MCV 85.8  MCH 25.6*  MCHC 29.8*  RDW 20.9*  PLT 261   Thyroid No results for input(s): "TSH", "FREET4" in the last 168 hours.  BNP Recent Labs  Lab 10/28/22 1147  BNP 728.8*    DDimer No results for input(s): "DDIMER" in the last 168  hours.   Radiology/Studies:  DG Chest 2 View  Result Date: 10/28/2022 CLINICAL DATA:  Shortness of breath and wheezing. Recently diagnosed with pneumonia EXAM: CHEST - 2 VIEW COMPARISON:  09/16/2022 FINDINGS: AP and lateral views. Midline trachea. Mild cardiomegaly. Atherosclerosis in the transverse aorta. Small bilateral pleural effusions. No pneumothorax. Mild interstitial edema. No lobar consolidation. Numerous leads and wires project over the chest. Mild similar bibasilar atelectasis. IMPRESSION: Similar appearance of congestive heart failure and small bilateral pleural effusions. Aortic Atherosclerosis (ICD10-I70.0). Electronically Signed   By: Jeronimo Greaves M.D.   On: 10/28/2022 12:13     Assessment and Plan:   Acute on chronic diastolic heart failure: Patient currently on BiPAP.  She has been given IV Lasix.  She has significant lower extremity edema and pleural effusion and pulmonary edema on chest x-ray.  BNP significantly elevated.  Agree with continued diuresis.  Monitor weights and strict INO.  Agree with 40 mg twice daily Lasix.  Kidney function is improved from 1 month ago.  Has had surgeries revision and elevation likely due to volume overload. Hypertension: Blood pressure significantly elevated.  Heavan Francom add as needed hydralazine to admission orders. Hyperlipidemia: Continue Lipitor Diabetes: Plan per primary team.   Risk Assessment/Risk Scores:        New York Heart Association (NYHA) Functional Class NYHA Class III        For questions or updates, please contact [facility] Please consult [email or web address] for contact info under    Signed, Briget Shaheed Jorja Loa, MD  10/28/2022 4:11 PM

## 2022-10-29 DIAGNOSIS — I5033 Acute on chronic diastolic (congestive) heart failure: Secondary | ICD-10-CM

## 2022-10-29 LAB — BASIC METABOLIC PANEL
Anion gap: 14 (ref 5–15)
BUN: 19 mg/dL (ref 8–23)
CO2: 26 mmol/L (ref 22–32)
Calcium: 8.1 mg/dL — ABNORMAL LOW (ref 8.9–10.3)
Chloride: 101 mmol/L (ref 98–111)
Creatinine, Ser: 1.25 mg/dL — ABNORMAL HIGH (ref 0.44–1.00)
GFR, Estimated: 41 mL/min — ABNORMAL LOW (ref >60)
Glucose, Bld: 367 mg/dL — ABNORMAL HIGH (ref 70–99)
Potassium: 4.4 mmol/L (ref 3.5–5.1)
Sodium: 141 mmol/L (ref 135–145)

## 2022-10-29 LAB — CBC
HCT: 33 % — ABNORMAL LOW (ref 36.0–46.0)
Hemoglobin: 9.9 g/dL — ABNORMAL LOW (ref 12.0–15.0)
MCH: 25.4 pg — ABNORMAL LOW (ref 26.0–34.0)
MCHC: 30 g/dL (ref 30.0–36.0)
MCV: 84.8 fL (ref 80.0–100.0)
Platelets: 268 10*3/uL (ref 150–400)
RBC: 3.89 MIL/uL (ref 3.87–5.11)
RDW: 20.8 % — ABNORMAL HIGH (ref 11.5–15.5)
WBC: 3 10*3/uL — ABNORMAL LOW (ref 4.0–10.5)
nRBC: 0 % (ref 0.0–0.2)

## 2022-10-29 LAB — ALBUMIN: Albumin: 2.2 g/dL — ABNORMAL LOW (ref 3.5–5.0)

## 2022-10-29 LAB — GLUCOSE, CAPILLARY
Glucose-Capillary: 351 mg/dL — ABNORMAL HIGH (ref 70–99)
Glucose-Capillary: 288 mg/dL — ABNORMAL HIGH (ref 70–99)
Glucose-Capillary: 203 mg/dL — ABNORMAL HIGH (ref 70–99)
Glucose-Capillary: 223 mg/dL — ABNORMAL HIGH (ref 70–99)

## 2022-10-29 MED ORDER — DAKINS (1/4 STRENGTH) 0.125 % EX SOLN
Freq: Two times a day (BID) | CUTANEOUS | Status: DC
  Filled 2022-10-29: qty 473

## 2022-10-29 MED ORDER — VITAMIN C 500 MG PO TABS
500.0000 mg | ORAL_TABLET | Freq: Every day | ORAL | Status: DC
  Administered 2022-10-29 – 2022-11-04 (×7): 500 mg via ORAL
  Filled 2022-10-29 (×7): qty 1

## 2022-10-29 MED ORDER — INSULIN ASPART 100 UNIT/ML IJ SOLN
3.0000 [IU] | Freq: Three times a day (TID) | INTRAMUSCULAR | Status: DC
  Administered 2022-10-29 – 2022-11-04 (×17): 3 [IU] via SUBCUTANEOUS

## 2022-10-29 MED ORDER — FUROSEMIDE 10 MG/ML IJ SOLN: 60.0000 mg | Freq: Two times a day (BID) | INTRAMUSCULAR | Status: DC

## 2022-10-29 MED ORDER — INSULIN GLARGINE-YFGN 100 UNIT/ML ~~LOC~~ SOLN
25.0000 [IU] | Freq: Two times a day (BID) | SUBCUTANEOUS | Status: DC
  Administered 2022-10-29 – 2022-11-04 (×13): 25 [IU] via SUBCUTANEOUS
  Filled 2022-10-29 (×14): qty 0.25

## 2022-10-29 MED ORDER — IPRATROPIUM-ALBUTEROL 0.5-2.5 (3) MG/3ML IN SOLN
3.0000 mL | Freq: Four times a day (QID) | RESPIRATORY_TRACT | Status: DC
  Administered 2022-10-29 – 2022-10-31 (×8): 3 mL via RESPIRATORY_TRACT
  Filled 2022-10-29 (×7): qty 3

## 2022-10-29 MED ORDER — GUAIFENESIN ER 600 MG PO TB12
600.0000 mg | ORAL_TABLET | Freq: Two times a day (BID) | ORAL | Status: DC
  Administered 2022-10-29 – 2022-11-04 (×13): 600 mg via ORAL
  Filled 2022-10-29 (×13): qty 1

## 2022-10-29 MED ORDER — GLUCERNA SHAKE PO LIQD
237.0000 mL | Freq: Three times a day (TID) | ORAL | Status: DC
  Administered 2022-10-29 – 2022-11-04 (×12): 237 mL via ORAL

## 2022-10-29 MED ORDER — FUROSEMIDE 40 MG PO TABS
40.0000 mg | ORAL_TABLET | Freq: Two times a day (BID) | ORAL | Status: DC
  Administered 2022-10-29: 40 mg via ORAL
  Filled 2022-10-29: qty 1

## 2022-10-29 MED ORDER — FUROSEMIDE 10 MG/ML IJ SOLN
60.0000 mg | INTRAMUSCULAR | Status: DC
  Administered 2022-10-29 – 2022-11-01 (×7): 60 mg via INTRAVENOUS
  Filled 2022-10-29 (×7): qty 6

## 2022-10-29 MED ORDER — ZINC SULFATE 220 (50 ZN) MG PO CAPS
220.0000 mg | ORAL_CAPSULE | Freq: Every day | ORAL | Status: DC
  Administered 2022-10-29 – 2022-11-04 (×7): 220 mg via ORAL
  Filled 2022-10-29 (×7): qty 1

## 2022-10-29 MED ORDER — DICLOFENAC SODIUM 1 % EX GEL
4.0000 g | Freq: Three times a day (TID) | CUTANEOUS | Status: DC
  Administered 2022-10-29 – 2022-10-30 (×4): 4 g via TOPICAL
  Filled 2022-10-29: qty 100

## 2022-10-29 MED ORDER — POTASSIUM CHLORIDE CRYS ER 20 MEQ PO TBCR
40.0000 meq | EXTENDED_RELEASE_TABLET | Freq: Once | ORAL | Status: AC
  Administered 2022-10-29: 40 meq via ORAL
  Filled 2022-10-29: qty 2

## 2022-10-29 MED ORDER — ADULT MULTIVITAMIN W/MINERALS CH
1.0000 | ORAL_TABLET | Freq: Every day | ORAL | Status: DC
  Administered 2022-10-29 – 2022-11-04 (×7): 1 via ORAL
  Filled 2022-10-29 (×7): qty 1

## 2022-10-29 NOTE — Evaluation (Signed)
Physical Therapy Evaluation Patient Details Name: [name] [name] MRN: [id number] DOB: [birth year] Today's Date: 10/29/2022  History of Present Illness  Pt is 89 year old presented to [facility] on  10/28/22 for SOB and progressive edema. Pt with acute on chronic diastolic chf. Pt also with rt gluteal abscess with necrosis that has wound VAC in place.  PMH - chf, DM, CVA CKD, htn, obesity  Clinical Impression  Pt admitted with above diagnosis and presents to PT with functional limitations due to deficits listed below (See PT problem list). Pt needs skilled PT to maximize independence and safety. Pt with excellent family support and they want pt to return home with son instead of going to SNF. Pt has equipment at home including a Stedy type sit to stand lift. Will focus on bed mobility and transfers to lessen burden of care at home. .          If plan is discharge home, recommend the following: Two people to help with walking and/or transfers;Two people to help with bathing/dressing/bathroom;Assistance with cooking/housework;Assist for transportation;Help with stairs or ramp for entrance   Can travel by private vehicle        Equipment Recommendations None recommended by PT  Recommendations for Other Services       Functional Status Assessment Patient has had a recent decline in their functional status and demonstrates the ability to make significant improvements in function in a reasonable and predictable amount of time.     Precautions / Restrictions Precautions Precautions: Fall      Mobility  Bed Mobility Overal bed mobility: Needs Assistance Bed Mobility: Supine to Sit, Sit to Sidelying     Supine to sit: +2 for physical assistance, Mod assist, Used rails, HOB elevated   Sit to sidelying: +2 for physical assistance, Mod assist General bed mobility comments: Assist to bring legs off, elevate trunk into sitting and bring hips to EOB. Assist to lower trunk and bring legs back up into  bed.    Transfers Overall transfer level: Needs assistance Equipment used: Ambulation equipment used Transfers: Sit to/from Stand Sit to Stand: +2 physical assistance, Mod assist           General transfer comment: Used bed pad to assist hips up with Stedy. Transfer via Lift Equipment: Stedy  Ambulation/Gait             Pre-gait activities: Stood with Stedy x 2 for 15-20 sec with +2 min assist    Stairs            Wheelchair Mobility     Tilt Bed    Modified Rankin (Stroke Patients Only)       Balance Overall balance assessment: Needs assistance Sitting-balance support: No upper extremity supported, Feet supported Sitting balance-Leahy Scale: Fair     Standing balance support: Bilateral upper extremity supported, During functional activity, Reliant on assistive device for balance Standing balance-Leahy Scale: Poor Standing balance comment: Stedy and +2 min for static standing                             Pertinent Vitals/Pain Pain Assessment Pain Assessment: Faces Faces Pain Scale: Hurts even more Pain Location: rt knee Pain Descriptors / Indicators: Grimacing, Guarding Pain Intervention(s): Limited activity within patient's tolerance, Monitored during session, Premedicated before session (lidocaine patch)    Home Living Family/patient expects to be discharged to:: Private residence Living Arrangements: Children Available Help at Discharge: Family;Available 24 hours/day  Home Equipment: Grab bars - tub/shower;Grab bars - toilet;Shower seat;Hospital bed;Other (comment);Wheelchair - manual;Wheelchair - Banker (2 wheels) [name])      Prior Function Prior Level of Function : Needs assist             Mobility Comments: Since DC home from SNF a few days ago pt using Stedy to stand with assist and for transfers.       Extremity/Trunk Assessment   Upper Extremity Assessment Upper  Extremity Assessment: Defer to OT evaluation    Lower Extremity Assessment Lower Extremity Assessment: Generalized weakness       Communication   Communication Communication: Other (comment);Hearing impairment (low volume)  Cognition Arousal: Alert Behavior During Therapy: WFL for tasks assessed/performed Overall Cognitive Status: Within Functional Limits for tasks assessed                                          General Comments General comments (skin integrity, edema, etc.): VSS on 2L O2    Exercises     Assessment/Plan    PT Assessment Patient needs continued PT services  PT Problem List Decreased strength;Decreased activity tolerance;Decreased balance;Decreased mobility;Cardiopulmonary status limiting activity;Obesity;Pain       PT Treatment Interventions DME instruction;Gait training;Functional mobility training;Therapeutic activities;Therapeutic exercise;Balance training;Patient/family education    PT Goals (Current goals can be found in the Care Plan section)  Acute Rehab PT Goals Patient Stated Goal: go home PT Goal Formulation: With patient/family Time For Goal Achievement: 11/12/22 Potential to Achieve Goals: Fair    Frequency Min 1X/week     Co-evaluation PT/OT/SLP Co-Evaluation/Treatment: Yes Reason for Co-Treatment: For patient/therapist safety PT goals addressed during session: Mobility/safety with mobility;Balance         AM-PAC PT "6 Clicks" Mobility  Outcome Measure Help needed turning from your back to your side while in a flat bed without using bedrails?: Total Help needed moving from lying on your back to sitting on the side of a flat bed without using bedrails?: Total Help needed moving to and from a bed to a chair (including a wheelchair)?: Total Help needed standing up from a chair using your arms (e.g., wheelchair or bedside chair)?: Total Help needed to walk in hospital room?: Total Help needed climbing 3-5 steps  with a railing? : Total 6 Click Score: 6    End of Session Equipment Utilized During Treatment: Oxygen Activity Tolerance: Patient limited by fatigue Patient left: in bed;with call bell/phone within reach;with bed alarm set;with family/visitor present Nurse Communication: Mobility status;Need for lift equipment PT Visit Diagnosis: Other abnormalities of gait and mobility (R26.89);Muscle weakness (generalized) (M62.81);Difficulty in walking, not elsewhere classified (R26.2);Pain Pain - Right/Left: Right Pain - part of body: Knee    Time: 9604-5409 PT Time Calculation (min) (ACUTE ONLY): 26 min   Charges:   PT Evaluation $PT Eval Moderate Complexity: 1 Mod   PT General Charges $$ ACUTE PT VISIT: 1 Visit         [facility] PT Acute Rehabilitation Services Office [phone]   Angelina Ok [facility] 10/29/2022, 4:01 PM

## 2022-10-29 NOTE — Consult Note (Signed)
WOC Nurse Consult Note: Reason for Consult:Right gluteal abscess with recent I & D.  Arrived with NPWT in place.  Bedside RN changed VAC dressing 10/29/2022 evening.  Measurements were obtained and wound bed assessed.  Wound type: infectious, s/p debridement Pressure Injury POA: Yes Measurement: see flow sheet Wound bed: RN reports is red and clean Drainage (amount, consistency, odor) serosanguinous  Periwound: intact  Dressing procedure/placement/frequency:  NPWT (VAC) dressing changed by bedside staff 10/29/2022.  Will change Tuesday and then will be twice weekly while inpatient. Six pieces black foam reportedly used due to presence of large wound surrounded by four smaller wounds Will follow.  Mike Gip MSN, RN, FNP-BC CWON Wound, Ostomy, Continence Nurse Outpatient [facility] [phone] Pager [phone]

## 2022-10-29 NOTE — Progress Notes (Signed)
Rounding Note    Patient Name: AUBREANA GOLDE Date of Encounter: 10/29/2022  [facility] Cardiologist: None   Subjective   -1.4 L.  BiPAP is off.  Feeling mildly improved.  Oxygen turned down to 1 L.  O2 sats at 97%.  Inpatient Medications    Scheduled Meds:  amantadine  100 mg Oral Daily   aspirin EC  81 mg Oral Daily   atorvastatin  40 mg Oral Daily   diclofenac Sodium  4 g Topical TID   docusate sodium  100 mg Oral BID   enoxaparin (LOVENOX) injection  30 mg Subcutaneous Q24H   furosemide  40 mg Intravenous BID   hydrALAZINE  100 mg Oral TID   insulin aspart  0-15 Units Subcutaneous TID WC   insulin aspart  0-5 Units Subcutaneous QHS   insulin aspart  3 Units Subcutaneous TID WC   insulin glargine-yfgn  25 Units Subcutaneous BID   lidocaine  2 patch Transdermal Q24H   metoprolol tartrate  100 mg Oral BID   pantoprazole  40 mg Oral Daily   senna-docusate  1 tablet Oral BID   sodium chloride flush  3 mL Intravenous Q12H   Continuous Infusions:  azithromycin 500 mg (10/28/22 1922)   cefTRIAXone (ROCEPHIN)  IV Stopped (10/28/22 1851)   PRN Meds: acetaminophen **OR** acetaminophen, albuterol, bisacodyl, hydrALAZINE, methocarbamol, ondansetron **OR** ondansetron (ZOFRAN) IV, oxyCODONE, polyethylene glycol, QUEtiapine, traZODone   Vital Signs    Vitals:   10/29/22 0447 10/29/22 0516 10/29/22 0746 10/29/22 0811  BP: (!) 159/66  (!) 173/57   Pulse: 68  73 73  Resp: 16  15 17   Temp: 97.6 F (36.4 C)  98.1 F (36.7 C)   TempSrc: Oral  Oral   SpO2: 100%  100% 100%  Weight:  98.8 kg      Intake/Output Summary (Last 24 hours) at 10/29/2022 0924 Last data filed at 10/29/2022 0452 Gross per 24 hour  Intake 300 ml  Output 1720 ml  Net -1420 ml      10/29/2022    5:16 AM 09/22/2022    6:06 AM 09/20/2022    5:00 AM  Last 3 Weights  Weight (lbs) 217 lb 13 oz 295 lb 304 lb  Weight (kg) 98.8 kg 133.811 kg 137.893 kg      Telemetry    Sinus rhythm-  Personally Reviewed  ECG    None new- Personally Reviewed  Physical Exam   GEN: No acute distress.   Neck: No JVD Cardiac: RRR, no murmurs, rubs, or gallops.  Respiratory: Diffuse wheezing GI: Soft, nontender, non-distended  MS: 1-2+ edema; No deformity. Neuro:  Nonfocal  Psych: Normal affect   Labs    High Sensitivity Troponin:   Recent Labs  Lab 10/28/22 1148 10/28/22 1337  TROPONINIHS 24* 23*     Chemistry Recent Labs  Lab 10/28/22 1147 10/29/22 0332  NA 139 141  K 4.2 4.4  CL 104 101  CO2 26 26  GLUCOSE 190* 367*  BUN 21 19  CREATININE 1.05* 1.25*  CALCIUM 7.7* 8.1*  GFRNONAA 51* 41*  ANIONGAP 9 14    Lipids No results for input(s): "CHOL", "TRIG", "HDL", "LABVLDL", "LDLCALC", "CHOLHDL" in the last 168 hours.  Hematology Recent Labs  Lab 10/28/22 1147 10/29/22 0332  WBC 5.3 3.0*  RBC 3.67* 3.89  HGB 9.4* 9.9*  HCT 31.5* 33.0*  MCV 85.8 84.8  MCH 25.6* 25.4*  MCHC 29.8* 30.0  RDW 20.9* 20.8*  PLT 261 268  Thyroid No results for input(s): "TSH", "FREET4" in the last 168 hours.  BNP Recent Labs  Lab 10/28/22 1147  BNP 728.8*    DDimer No results for input(s): "DDIMER" in the last 168 hours.   Radiology    DG Chest 2 View  Result Date: 10/28/2022 CLINICAL DATA:  Shortness of breath and wheezing. Recently diagnosed with pneumonia EXAM: CHEST - 2 VIEW COMPARISON:  09/16/2022 FINDINGS: AP and lateral views. Midline trachea. Mild cardiomegaly. Atherosclerosis in the transverse aorta. Small bilateral pleural effusions. No pneumothorax. Mild interstitial edema. No lobar consolidation. Numerous leads and wires project over the chest. Mild similar bibasilar atelectasis. IMPRESSION: Similar appearance of congestive heart failure and small bilateral pleural effusions. Aortic Atherosclerosis (ICD10-I70.0). Electronically Signed   By: Jeronimo Greaves M.D.   On: 10/28/2022 12:13    Cardiac Studies   TTE 09/12/22  1. Left ventricular ejection fraction, by  estimation, is 60 to 65%. The  left ventricle has normal function. The left ventricle has no regional  wall motion abnormalities. There is mild concentric left ventricular  hypertrophy. Left ventricular diastolic  parameters are consistent with Grade II diastolic dysfunction  (pseudonormalization).   2. Right ventricular systolic function is normal. The right ventricular  size is normal. There is mildly elevated pulmonary artery systolic  pressure. The estimated right ventricular systolic pressure is 39.8 mmHg.   3. Left atrial size was moderately dilated.   4. Right atrial size was mild to moderately dilated.   5. The mitral valve is degenerative. Mild mitral valve regurgitation. No  evidence of mitral stenosis. Moderate mitral annular calcification.   6. The aortic valve is tricuspid. There is mild calcification of the  aortic valve. Aortic valve regurgitation is trivial. Aortic valve  sclerosis/calcification is present, without any evidence of aortic  stenosis.   7. The inferior vena cava is dilated in size with >50% respiratory  variability, suggesting right atrial pressure of 8 mmHg.   Patient Profile     89 y.o. female history of diabetes, CVA, hypertension, CKD stage IIIb presented to hospital with shortness of breath and diastolic heart failure.  Assessment & Plan    1.  Acute on chronic diastolic heart failure: Net -1.4 L since admission.  She has done well with the IV Lasix.  Creatinine mildly elevated today.  Vasiliy Mccarry switch her to p.o. Lasix.  2.  Hypertension: Pressure significantly elevated.  Marcus Schwandt stop metoprolol and start carvedilol.  May need amlodipine as well.  3.  Hyperlipidemia: Continue Lipitor  4.  Diabetes: Plan per primary team  5.  Asthma: Has significant wheezing throughout her lung fields.  Abrian Hanover need respiratory therapy.     For questions or updates, please contact [facility] Please consult [email or web address] for contact info under         Signed, Tequia Wolman Jorja Loa, MD  10/29/2022, 9:24 AM

## 2022-10-29 NOTE — Progress Notes (Signed)
Patient in no respiratory distress at this time. BiPAP is on stand-by at bedside.

## 2022-10-29 NOTE — Plan of Care (Signed)

## 2022-10-29 NOTE — Progress Notes (Signed)
PROGRESS NOTE    [name] [name]  VHQ:[id number] DOB: 1932/09/09 DOA: 10/28/2022 PCP: [name], MD  89/F with history of chronic diastolic CHF, type 2 diabetes mellitus, CVA, dyslipidemia, CKD 3B, obesity, hypertension was admitted 7/12-26 for right gluteal abscess with wet gangrene and necrosis complicated by coag negative staph bacteremia, underwent I&D and debridement per surgery, completed Unasyn 7/24 went to SNF for rehab with wound VAC.  While at SNF she was taken off Lasix on account of AKI, creatinine trended up to 3.5, subsequently discharged home from rehab 3 days ago, developed worsening shortness of breath and progressive edema In the ED tachypneic hypoxic, placed on BiPAP, labs with BNP 728, creatinine 1.05, hemoglobin 9.4,, chest x-ray with CHF and small pleural effusions  Subjective: -Breathing a little better today  Assessment and Plan:  Acute on chronic diastolic CHF - she is massively volume overloaded has 2-3+ edema extending to abdomen, thighs -Required BiPAP on admission, now off -Recent echo 7/24 noted EF of 60-65% with grade 2 diastolic dysfunction and normal RV -Taken off diuretics 3 weeks ago at [facility] on account of AKI -Cards consulting, continue IV Lasix today -Add Jardiance and/or Aldactone tomorrow if kidney function tolerates -Check albumin, suspect third spacing   R gluteal abscess with necrosis -s/p I&D, debridement -Has wound vac but there are concerns that this is not working -Wound care consult   ?CAP -Clinically do not suspect pneumonia, suspect this is all fluid overload, afebrile without leukocytosis, discontinue antibiotics and monitor   Stage 3b CKD -Appears to be stable at this time -Stable, avoid hypotension and nephrotoxic meds   HTN -Continue home meds - hydralazine, metoprolol -Will also add prn hydralazine   HLD -Continue Lipitor   DM -Last A1c was 9.8, poor control -Increase glargine, add meal coverage   Morbid  obesity -Increased morbidity/mortality risk -Weight loss should be encouraged -Outpatient PCP/bariatric medicine f/u encouraged      Advance Care Planning: DNR   Consults: Cardiology; CHF navigator; PT/OT; nutrition; TOC team   DVT Prophylaxis: Lovenox Family Communication: Family at bedside Disposition Plan: May need short-term rehab  Consultants:    Procedures:   Antimicrobials:    Objective: Vitals:   10/29/22 0516 10/29/22 0746 10/29/22 0811 10/29/22 0935  BP:  (!) 173/57    Pulse:  73 73 72  Resp:  15 17 20   Temp:  98.1 F (36.7 C)    TempSrc:  Oral    SpO2:  100% 100% 96%  Weight: 98.8 kg       Intake/Output Summary (Last 24 hours) at 10/29/2022 1001 Last data filed at 10/29/2022 0452 Gross per 24 hour  Intake 300 ml  Output 1720 ml  Net -1420 ml   Filed Weights   10/29/22 0516  Weight: 98.8 kg    Examination:  General exam: Morbidly obese chronically ill female sitting up in bed, AAOx3 HEENT: Positive JVD CVS: S1-S2, regular rhythm Lungs: Decreased breath sounds at the bases Abdomen: Obese, nontender, bowel sounds present, abdominal wall edema, flank edema Extremities: 2+ edema extending to upper thighs Sacrum, wound VAC noted small open wound at the medial edge Psychiatry:  Mood & affect appropriate.     Data Reviewed:   CBC: Recent Labs  Lab 10/28/22 1147 10/29/22 0332  WBC 5.3 3.0*  HGB 9.4* 9.9*  HCT 31.5* 33.0*  MCV 85.8 84.8  PLT 261 268   Basic Metabolic Panel: Recent Labs  Lab 10/28/22 1147 10/29/22 0332  NA 139 141  K  4.2 4.4  CL 104 101  CO2 26 26  GLUCOSE 190* 367*  BUN 21 19  CREATININE 1.05* 1.25*  CALCIUM 7.7* 8.1*   GFR: Estimated Creatinine Clearance: 36.9 mL/min (A) (by C-G formula based on SCr of 1.25 mg/dL (H)). Liver Function Tests: No results for input(s): "AST", "ALT", "ALKPHOS", "BILITOT", "PROT", "ALBUMIN" in the last 168 hours. No results for input(s): "LIPASE", "AMYLASE" in the last 168 hours. No  results for input(s): "AMMONIA" in the last 168 hours. Coagulation Profile: No results for input(s): "INR", "PROTIME" in the last 168 hours. Cardiac Enzymes: No results for input(s): "CKTOTAL", "CKMB", "CKMBINDEX", "TROPONINI" in the last 168 hours. BNP (last 3 results) No results for input(s): "PROBNP" in the last 8760 hours. HbA1C: No results for input(s): "HGBA1C" in the last 72 hours. CBG: Recent Labs  Lab 10/28/22 1714 10/28/22 2029 10/29/22 0607  GLUCAP 215* 229* 351*   Lipid Profile: No results for input(s): "CHOL", "HDL", "LDLCALC", "TRIG", "CHOLHDL", "LDLDIRECT" in the last 72 hours. Thyroid Function Tests: No results for input(s): "TSH", "T4TOTAL", "FREET4", "T3FREE", "THYROIDAB" in the last 72 hours. Anemia Panel: No results for input(s): "VITAMINB12", "FOLATE", "FERRITIN", "TIBC", "IRON", "RETICCTPCT" in the last 72 hours. Urine analysis:    Component Value Date/Time   COLORURINE YELLOW 09/13/2022 1652   APPEARANCEUR CLEAR 09/13/2022 1652   LABSPEC 1.015 09/13/2022 1652   PHURINE 5.0 09/13/2022 1652   GLUCOSEU NEGATIVE 09/13/2022 1652   HGBUR SMALL (A) 09/13/2022 1652   BILIRUBINUR NEGATIVE 09/13/2022 1652   KETONESUR NEGATIVE 09/13/2022 1652   PROTEINUR 30 (A) 09/13/2022 1652   UROBILINOGEN 0.2 07/23/2009 1015   NITRITE NEGATIVE 09/13/2022 1652   LEUKOCYTESUR NEGATIVE 09/13/2022 1652   Sepsis Labs: @LABRCNTIP (procalcitonin:4,lacticidven:4)  )No results found for this or any previous visit (from the past 240 hour(s)).   Radiology Studies: DG Chest 2 View  Result Date: 10/28/2022 CLINICAL DATA:  Shortness of breath and wheezing. Recently diagnosed with pneumonia EXAM: CHEST - 2 VIEW COMPARISON:  09/16/2022 FINDINGS: AP and lateral views. Midline trachea. Mild cardiomegaly. Atherosclerosis in the transverse aorta. Small bilateral pleural effusions. No pneumothorax. Mild interstitial edema. No lobar consolidation. Numerous leads and wires project over the  chest. Mild similar bibasilar atelectasis. IMPRESSION: Similar appearance of congestive heart failure and small bilateral pleural effusions. Aortic Atherosclerosis (ICD10-I70.0). Electronically Signed   By: Jeronimo Greaves M.D.   On: 10/28/2022 12:13     Scheduled Meds:  amantadine  100 mg Oral Daily   aspirin EC  81 mg Oral Daily   atorvastatin  40 mg Oral Daily   diclofenac Sodium  4 g Topical TID   docusate sodium  100 mg Oral BID   enoxaparin (LOVENOX) injection  30 mg Subcutaneous Q24H   furosemide  60 mg Intravenous BID   hydrALAZINE  100 mg Oral TID   insulin aspart  0-15 Units Subcutaneous TID WC   insulin aspart  0-5 Units Subcutaneous QHS   insulin aspart  3 Units Subcutaneous TID WC   insulin glargine-yfgn  25 Units Subcutaneous BID   lidocaine  2 patch Transdermal Q24H   metoprolol tartrate  100 mg Oral BID   pantoprazole  40 mg Oral Daily   potassium chloride  40 mEq Oral Once   senna-docusate  1 tablet Oral BID   sodium chloride flush  3 mL Intravenous Q12H   Continuous Infusions:  azithromycin 500 mg (10/28/22 1922)   cefTRIAXone (ROCEPHIN)  IV Stopped (10/28/22 1851)     LOS: 1 day  Time spent:    Zannie Cove, MD [facility]   10/29/2022, 10:01 AM

## 2022-10-29 NOTE — Plan of Care (Signed)

## 2022-10-29 NOTE — Evaluation (Signed)
Occupational Therapy Evaluation Patient Details Name: [name] [name] MRN: [id number] DOB: 08/23/1932 Today's Date: 10/29/2022   History of Present Illness Pt is 89 year old presented to [facility] on  10/28/22 for SOB and progressive edema. Pt with acute on chronic diastolic chf. Pt also with rt gluteal abscess with necrosis that has wound VAC in place.  PMH - chf, DM, CVA CKD, htn, obesity   Clinical Impression   Pt admitted to [facility] approx. 3 days post discharge from SNF with decreased activity tolerance, B UE generalized weakness, R UE edematous hand and wrist, decreased balance during functional tasks, and decreased safety and independence with ADLs, bed mobility during/in preparation for functional tasks, and functional transfers. Pt currently demonstrates ability to complete UB ADLs with Set up to Mod assist, LB ADLs with Max +2 to Total assist +2, and functional transfers with Mod assist +2 with use of Stedy lift. Pt will benefit from acute skilled OT services to address deficits outlined below and increase safety and independence with ADLs and functional transfers with use of Stedy-style sit/stand lift to maximize rehab potential and improve pt safety in discharging to son's home rather than SNF. Pt has excellent family support and needed equipment in the home, including Stedy-style lift. Post acute discharge, pt will benefit from continued skilled OT services in the home to maximize rehab potential.       If plan is discharge home, recommend the following: Two people to help with walking and/or transfers;Two people to help with bathing/dressing/bathroom;Assistance with cooking/housework;Assistance with feeding;Direct supervision/assist for medications management;Direct supervision/assist for financial management;Assist for transportation;Help with stairs or ramp for entrance (Set up for self feeding)    Functional Status Assessment  Patient has had a recent decline in their functional status and  demonstrates the ability to make significant improvements in function in a reasonable and predictable amount of time.  Equipment Recommendations  None recommended by OT (Already has needed equipment)    Recommendations for Other Services       Precautions / Restrictions Precautions Precautions: Fall Restrictions Weight Bearing Restrictions: No      Mobility Bed Mobility Overal bed mobility: Needs Assistance Bed Mobility: Supine to Sit, Sit to Sidelying     Supine to sit: +2 for physical assistance, Mod assist, Used rails, HOB elevated   Sit to sidelying: +2 for physical assistance, Mod assist General bed mobility comments: Assist to bring legs off, elevate trunk into sitting and bring hips to EOB. Assist to lower trunk and bring legs back up into bed.    Transfers Overall transfer level: Needs assistance Equipment used: Ambulation equipment used Transfers: Sit to/from Stand Sit to Stand: +2 physical assistance, Mod assist           General transfer comment: Used bed pad to assist hips up with Stedy. Transfer via Lift Equipment: Stedy    Balance Overall balance assessment: Needs assistance Sitting-balance support: No upper extremity supported, Feet supported Sitting balance-Leahy Scale: Fair     Standing balance support: Bilateral upper extremity supported, During functional activity, Reliant on assistive device for balance Standing balance-Leahy Scale: Poor Standing balance comment: Stedy and +2 min for static standing                           ADL either performed or assessed with clinical judgement   ADL Overall ADL's : Needs assistance/impaired Eating/Feeding: Set up;Bed level   Grooming: Contact guard assist;Minimal assistance;Bed level   Upper  Body Bathing: Moderate assistance;Bed level;Maximal assistance   Lower Body Bathing: Maximal assistance;+2 for physical assistance;Bed level   Upper Body Dressing : Moderate assistance;Bed level    Lower Body Dressing: Maximal assistance;+2 for physical assistance;Bed level   Toilet Transfer: Moderate assistance;+2 for physical assistance;BSC/3in1 (Stedy lift)   Toileting- Clothing Manipulation and Hygiene: Total assistance;+2 for physical assistance;Sit to/from stand (with use of Stedy lift)         General ADL Comments: Pt fatiguing quickly with participation in functional tasks.     Vision         Perception         Praxis         Pertinent Vitals/Pain Pain Assessment Pain Assessment: Faces Faces Pain Scale: Hurts even more Pain Location: rt knee Pain Descriptors / Indicators: Grimacing, Guarding Pain Intervention(s): Limited activity within patient's tolerance, Monitored during session, Premedicated before session (lidocaince patch)     Extremity/Trunk Assessment Upper Extremity Assessment Upper Extremity Assessment: Generalized weakness;Right hand dominant;RUE deficits/detail RUE Deficits / Details: generalized weakness Right > Left with history of CVA with residual Right sided weakness; decreased shoulder ROM; AROM shoulder flexion to approx. 50 degrees; PROM shoulder flexion to appox. 90 degrees; mildly edematous hand and wrist RUE Sensation: decreased light touch;decreased proprioception RUE Coordination: decreased fine motor;decreased gross motor   Lower Extremity Assessment Lower Extremity Assessment: Defer to PT evaluation       Communication Communication Communication: Other (comment);Hearing impairment (low volume)   Cognition Arousal: Alert Behavior During Therapy: WFL for tasks assessed/performed Overall Cognitive Status: Impaired/Different from baseline Area of Impairment: Awareness, Problem solving                           Awareness: Emergent Problem Solving: Slow processing       General Comments  VSS on 2L continuous O2 through nasal cannula throughout session. two daughters and granddaughter present throughout  session.    Exercises     Shoulder Instructions      Home Living Family/patient expects to be discharged to:: Private residence Living Arrangements: Children Available Help at Discharge: Family;Available 24 hours/day Type of Home: House                       Home Equipment: Grab bars - tub/shower;Grab bars - toilet;Shower seat;Hospital bed;Other (comment);Wheelchair - manual;Wheelchair - Banker (2 wheels) (Stedy-style lift)   Additional Comments: Additional information regarding son's home layout needed. Pt previously lived at [facility], was recently in SNF, and will be discharging to son's home.      Prior Functioning/Environment Prior Level of Function : Needs assist             Mobility Comments: Since DC home from SNF a few days ago pt using Stedy to stand with assist and for transfers. ADLs Comments: Since DC home from SNF, pt requiring Set up for self feeding and grooming, Mod to Max assist for UB dressing/bathing, and Max to Total assist for LB dressing/bathing. Pt reports, prior to hospitalization leading short-term, pt with Independent to Supervision for ADLs.        OT Problem List: Decreased strength;Decreased activity tolerance;Impaired balance (sitting and/or standing);Decreased coordination;Decreased knowledge of use of DME or AE      OT Treatment/Interventions: Self-care/ADL training;Therapeutic exercise;DME and/or AE instruction;Therapeutic activities;Patient/family education;Balance training    OT Goals(Current goals can be found in the care plan section) Acute Rehab OT Goals Patient  Stated Goal: To get stronger and return home with family support OT Goal Formulation: With patient/family Time For Goal Achievement: 11/12/22 Potential to Achieve Goals: Good ADL Goals Pt Will Perform Grooming: with supervision;sitting Pt Will Perform Upper Body Bathing: with contact guard assist;sitting Pt Will Perform Upper  Body Dressing: with contact guard assist;sitting Pt Will Transfer to Toilet: bedside commode (with assist +1 with use of Stedy lift)  OT Frequency: Min 1X/week    Co-evaluation PT/OT/SLP Co-Evaluation/Treatment: Yes Reason for Co-Treatment: For patient/therapist safety PT goals addressed during session: Mobility/safety with mobility;Balance OT goals addressed during session: ADL's and self-care;Strengthening/ROM      AM-PAC OT "6 Clicks" Daily Activity     Outcome Measure Help from another person eating meals?: A Little Help from another person taking care of personal grooming?: A Little Help from another person toileting, which includes using toliet, bedpan, or urinal?: Total Help from another person bathing (including washing, rinsing, drying)?: A Lot Help from another person to put on and taking off regular upper body clothing?: A Lot Help from another person to put on and taking off regular lower body clothing?: A Lot 6 Click Score: 13   End of Session Equipment Utilized During Treatment: Oxygen;Other (comment) [name]) Nurse Communication: Mobility status;Need for lift equipment  Activity Tolerance: Patient tolerated treatment well;Patient limited by fatigue Patient left: in bed;with call bell/phone within reach;with bed alarm set;with family/visitor present  OT Visit Diagnosis: Unsteadiness on feet (R26.81);Other abnormalities of gait and mobility (R26.89);Muscle weakness (generalized) (M62.81);Ataxia, unspecified (R27.0);Other (comment) (Decreased activity tolerance)                Time: 1400-1426 OT Time Calculation (min): 26 min Charges:  OT General Charges $OT Visit: 1 Visit OT Evaluation $OT Eval Moderate Complexity: 1 Mod  [address] OTR/L, MA Acute Rehab [phone]   Lendon Colonel 10/29/2022, 6:15 PM

## 2022-10-29 NOTE — Progress Notes (Signed)
Initial Nutrition Assessment  DOCUMENTATION CODES:   Obesity unspecified  INTERVENTION:  - Add Glucerna Shake po TID, each supplement provides 220 kcal and 10 grams of protein  - Add MVI q day.   - Add Vit C daily.   - Add Zinc x14 days.   NUTRITION DIAGNOSIS:   Increased nutrient needs related to wound healing as evidenced by estimated needs.  GOAL:   Patient will meet greater than or equal to 90% of their needs  MONITOR:   PO intake, Supplement acceptance  REASON FOR ASSESSMENT:   Consult Assessment of nutrition requirement/status  ASSESSMENT:   89 y.o. female admits related to SOB. PMH includes: DM, HTN, pancreatitis. Pt is currently receiving medical management related to acute on chronic diastolic heart failure.  Meds reviewed: lipitor, colace, lasix, sliding scale insulin, semglee, senokot. Labs reviewed: WDL. FS BG 229-351 mg/dL.   RD unable to reach pt via phone. Pt is oriented x3. No PO intakes since admit. Pt with increased nutrient needs related to wound healing. RD will add Glucerna with meals and continue to monitor PO intakes.   NUTRITION - FOCUSED PHYSICAL EXAM:  Remote assessment.  Diet Order:   Diet Order             Diet heart healthy/carb modified Room service appropriate? Yes; Fluid consistency: Thin; Fluid restriction: 1500 mL Fluid  Diet effective now                   EDUCATION NEEDS:   Not appropriate for education at this time  Skin:  Skin Assessment: Skin Integrity Issues: Skin Integrity Issues:: Unstageable Unstageable: R sacrum  Last BM:  8/30  Height:   Ht Readings from Last 1 Encounters:  09/12/22 5\' 6"  (1.676 m)    Weight:   Wt Readings from Last 1 Encounters:  10/29/22 98.8 kg    Ideal Body Weight:     BMI:  Body mass index is 35.16 kg/m.  Estimated Nutritional Needs:   Kcal:  1900-2200 kcals  Protein:  95-110 gm  Fluid:  </= 1.5 L  Bethann Humble, RD, LDN, CNSC.

## 2022-10-29 NOTE — Plan of Care (Signed)

## 2022-10-30 DIAGNOSIS — I10 Essential (primary) hypertension: Secondary | ICD-10-CM

## 2022-10-30 DIAGNOSIS — J9601 Acute respiratory failure with hypoxia: Secondary | ICD-10-CM

## 2022-10-30 DIAGNOSIS — I5033 Acute on chronic diastolic (congestive) heart failure: Secondary | ICD-10-CM | POA: Diagnosis not present

## 2022-10-30 LAB — CBC
HCT: 37.1 % (ref 36.0–46.0)
Hemoglobin: 11.2 g/dL — ABNORMAL LOW (ref 12.0–15.0)
MCH: 25.2 pg — ABNORMAL LOW (ref 26.0–34.0)
MCHC: 30.2 g/dL (ref 30.0–36.0)
MCV: 83.4 fL (ref 80.0–100.0)
Platelets: 291 10*3/uL (ref 150–400)
RBC: 4.45 MIL/uL (ref 3.87–5.11)
RDW: 21.2 % — ABNORMAL HIGH (ref 11.5–15.5)
WBC: 9.1 10*3/uL (ref 4.0–10.5)
nRBC: 0 % (ref 0.0–0.2)

## 2022-10-30 LAB — COMPREHENSIVE METABOLIC PANEL
ALT: 16 U/L (ref 0–44)
AST: 27 U/L (ref 15–41)
Albumin: 2.5 g/dL — ABNORMAL LOW (ref 3.5–5.0)
Alkaline Phosphatase: 70 U/L (ref 38–126)
Anion gap: 18 — ABNORMAL HIGH (ref 5–15)
BUN: 21 mg/dL (ref 8–23)
CO2: 29 mmol/L (ref 22–32)
Calcium: 8.5 mg/dL — ABNORMAL LOW (ref 8.9–10.3)
Chloride: 96 mmol/L — ABNORMAL LOW (ref 98–111)
Creatinine, Ser: 1.09 mg/dL — ABNORMAL HIGH (ref 0.44–1.00)
GFR, Estimated: 49 mL/min — ABNORMAL LOW (ref >60)
Glucose, Bld: 169 mg/dL — ABNORMAL HIGH (ref 70–99)
Potassium: 3.8 mmol/L (ref 3.5–5.1)
Sodium: 143 mmol/L (ref 135–145)
Total Bilirubin: 0.6 mg/dL (ref 0.3–1.2)
Total Protein: 6.4 g/dL — ABNORMAL LOW (ref 6.5–8.1)

## 2022-10-30 LAB — GLUCOSE, CAPILLARY
Glucose-Capillary: 185 mg/dL — ABNORMAL HIGH (ref 70–99)
Glucose-Capillary: 199 mg/dL — ABNORMAL HIGH (ref 70–99)
Glucose-Capillary: 226 mg/dL — ABNORMAL HIGH (ref 70–99)
Glucose-Capillary: 247 mg/dL — ABNORMAL HIGH (ref 70–99)

## 2022-10-30 MED ORDER — ALBUMIN HUMAN 25 % IV SOLN
25.0000 g | Freq: Four times a day (QID) | INTRAVENOUS | Status: AC
  Administered 2022-10-30 (×2): 25 g via INTRAVENOUS
  Filled 2022-10-30 (×2): qty 100

## 2022-10-30 MED ORDER — POLYETHYLENE GLYCOL 3350 17 G PO PACK: 17.0000 g | PACK | Freq: Every day | ORAL | Status: DC | PRN

## 2022-10-30 MED ORDER — BISACODYL 10 MG RE SUPP
10.0000 mg | Freq: Once | RECTAL | Status: DC
  Filled 2022-10-30: qty 1

## 2022-10-30 MED ORDER — FUROSEMIDE 10 MG/ML IJ SOLN
40.0000 mg | Freq: Once | INTRAMUSCULAR | Status: AC
  Administered 2022-10-30: 40 mg via INTRAVENOUS
  Filled 2022-10-30: qty 4

## 2022-10-30 MED ORDER — POTASSIUM CHLORIDE CRYS ER 20 MEQ PO TBCR
20.0000 meq | EXTENDED_RELEASE_TABLET | Freq: Once | ORAL | Status: AC
  Administered 2022-10-30: 20 meq via ORAL
  Filled 2022-10-30: qty 1

## 2022-10-30 MED ORDER — SPIRONOLACTONE 12.5 MG HALF TABLET: 12.5000 mg | ORAL_TABLET | Freq: Every day | ORAL | Status: DC

## 2022-10-30 MED ORDER — SPIRONOLACTONE 25 MG PO TABS
25.0000 mg | ORAL_TABLET | Freq: Every day | ORAL | Status: DC
  Administered 2022-10-30 – 2022-11-04 (×6): 25 mg via ORAL
  Filled 2022-10-30 (×6): qty 1

## 2022-10-30 MED ORDER — DICLOFENAC SODIUM 1 % EX GEL
2.0000 g | Freq: Three times a day (TID) | CUTANEOUS | Status: DC
  Administered 2022-10-30 – 2022-11-04 (×15): 2 g via TOPICAL
  Filled 2022-10-30: qty 100

## 2022-10-30 NOTE — Progress Notes (Signed)
PROGRESS NOTE    [name] [name]  WUJ:[id number] DOB: Apr 23, 1932 DOA: 10/28/2022 PCP: [name], MD  89/F with history of chronic diastolic CHF, type 2 diabetes mellitus, CVA, dyslipidemia, CKD 3B, obesity, hypertension was admitted 7/12-26 for right gluteal abscess with wet gangrene and necrosis complicated by coag negative staph bacteremia, underwent I&D and debridement per surgery, completed Unasyn 7/24 went to SNF for rehab with wound VAC.  While at SNF she was taken off Lasix on account of AKI, creatinine trended up to 3.5, subsequently discharged home from rehab 3 days ago, developed worsening shortness of breath and progressive edema In the ED tachypneic hypoxic, placed on BiPAP, labs with BNP 728, creatinine 1.05, hemoglobin 9.4,, chest x-ray with CHF and small pleural effusions  Subjective: -Required BiPAP again overnight  Assessment and Plan:  Acute on chronic diastolic CHF - she is significantly volume overloaded has 2+ edema extending to abdomen, thighs -Required BiPAP on admission, and last night -Recent echo 7/24 noted EF of 60-65% with grade 2 diastolic dysfunction and normal RV -Taken off diuretics 3 weeks ago at [facility] on account of AKI -Cards consulting, continue IV Lasix she is 4.2 L negative, add IV albumin X 1 to 2 days -Add Aldactone -Albumin is 2.2-2.5   R gluteal abscess with necrosis -s/p I&D, debridement -Continue wound VAC, WOC consulted   Asthma -I suspect her wheezes cardiac, remains massively volume overloaded and 15 to 20 pounds weight gain -Will continue DuoNebs regardless, no indication for steroids now   Stage 3b CKD -Appears to be stable at this time -Stable, avoid hypotension and nephrotoxic meds   HTN -Continue home meds - hydralazine, metoprolol -Will also add prn hydralazine   HLD -Continue Lipitor   DM -Last A1c was 9.8, poor control -Increase glargine, add meal coverage   Morbid obesity -Increased morbidity/mortality  risk -Weight loss should be encouraged -Outpatient PCP/bariatric medicine f/u encouraged      Advance Care Planning: DNR  DVT Prophylaxis: Lovenox Family Communication: Family at bedside Disposition Plan: May need short-term rehab  Consultants:    Procedures:   Antimicrobials:    Objective: Vitals:   10/30/22 0458 10/30/22 0545 10/30/22 0620 10/30/22 0756  BP:    (!) 177/65  Pulse:  83 89 85  Resp:   (!) 26   Temp:    98.7 F (37.1 C)  TempSrc:    Axillary  SpO2:  94% 96% 97%  Weight: 104.8 kg       Intake/Output Summary (Last 24 hours) at 10/30/2022 1131 Last data filed at 10/30/2022 9562 Gross per 24 hour  Intake 500 ml  Output 4205 ml  Net -3705 ml   Filed Weights   10/29/22 0516 10/30/22 0458  Weight: 98.8 kg 104.8 kg    Examination:  General exam: Morbidly obese chronically ill female sitting up in bed, AAOx3 HEENT: Positive JVD CVS: S1-S2, regular rhythm Lungs: Improving air movement, wheezes noted, Rales in lower lung Abdomen: Obese, nontender, bowel sounds present, abdominal wall and flank edema Extremities:2+ edema extending to upper thighs Sacrum, wound VAC noted small open wound at the medial edge Psychiatry:  Mood & affect appropriate.     Data Reviewed:   CBC: Recent Labs  Lab 10/28/22 1147 10/29/22 0332 10/30/22 0813  WBC 5.3 3.0* 9.1  HGB 9.4* 9.9* 11.2*  HCT 31.5* 33.0* 37.1  MCV 85.8 84.8 83.4  PLT 261 268 291   Basic Metabolic Panel: Recent Labs  Lab 10/28/22 1147 10/29/22 0332 10/30/22 0813  NA 139 141 143  K 4.2 4.4 3.8  CL 104 101 96*  CO2 26 26 29   GLUCOSE 190* 367* 169*  BUN 21 19 21   CREATININE 1.05* 1.25* 1.09*  CALCIUM 7.7* 8.1* 8.5*   GFR: Estimated Creatinine Clearance: 43.6 mL/min (A) (by C-G formula based on SCr of 1.09 mg/dL (H)). Liver Function Tests: Recent Labs  Lab 10/29/22 0332 10/30/22 0813  AST  --  27  ALT  --  16  ALKPHOS  --  70  BILITOT  --  0.6  PROT  --  6.4*  ALBUMIN 2.2* 2.5*    No results for input(s): "LIPASE", "AMYLASE" in the last 168 hours. No results for input(s): "AMMONIA" in the last 168 hours. Coagulation Profile: No results for input(s): "INR", "PROTIME" in the last 168 hours. Cardiac Enzymes: No results for input(s): "CKTOTAL", "CKMB", "CKMBINDEX", "TROPONINI" in the last 168 hours. BNP (last 3 results) No results for input(s): "PROBNP" in the last 8760 hours. HbA1C: No results for input(s): "HGBA1C" in the last 72 hours. CBG: Recent Labs  Lab 10/29/22 0607 10/29/22 1129 10/29/22 1739 10/29/22 2031 10/30/22 0622  GLUCAP 351* 288* 203* 223* 185*   Lipid Profile: No results for input(s): "CHOL", "HDL", "LDLCALC", "TRIG", "CHOLHDL", "LDLDIRECT" in the last 72 hours. Thyroid Function Tests: No results for input(s): "TSH", "T4TOTAL", "FREET4", "T3FREE", "THYROIDAB" in the last 72 hours. Anemia Panel: No results for input(s): "VITAMINB12", "FOLATE", "FERRITIN", "TIBC", "IRON", "RETICCTPCT" in the last 72 hours. Urine analysis:    Component Value Date/Time   COLORURINE YELLOW 09/13/2022 1652   APPEARANCEUR CLEAR 09/13/2022 1652   LABSPEC 1.015 09/13/2022 1652   PHURINE 5.0 09/13/2022 1652   GLUCOSEU NEGATIVE 09/13/2022 1652   HGBUR SMALL (A) 09/13/2022 1652   BILIRUBINUR NEGATIVE 09/13/2022 1652   KETONESUR NEGATIVE 09/13/2022 1652   PROTEINUR 30 (A) 09/13/2022 1652   UROBILINOGEN 0.2 07/23/2009 1015   NITRITE NEGATIVE 09/13/2022 1652   LEUKOCYTESUR NEGATIVE 09/13/2022 1652   Sepsis Labs: @LABRCNTIP (procalcitonin:4,lacticidven:4)  )No results found for this or any previous visit (from the past 240 hour(s)).   Radiology Studies: DG Chest 2 View  Result Date: 10/28/2022 CLINICAL DATA:  Shortness of breath and wheezing. Recently diagnosed with pneumonia EXAM: CHEST - 2 VIEW COMPARISON:  09/16/2022 FINDINGS: AP and lateral views. Midline trachea. Mild cardiomegaly. Atherosclerosis in the transverse aorta. Small bilateral pleural  effusions. No pneumothorax. Mild interstitial edema. No lobar consolidation. Numerous leads and wires project over the chest. Mild similar bibasilar atelectasis. IMPRESSION: Similar appearance of congestive heart failure and small bilateral pleural effusions. Aortic Atherosclerosis (ICD10-I70.0). Electronically Signed   By: Jeronimo Greaves M.D.   On: 10/28/2022 12:13     Scheduled Meds:  amantadine  100 mg Oral Daily   ascorbic acid  500 mg Oral Daily   aspirin EC  81 mg Oral Daily   atorvastatin  40 mg Oral Daily   bisacodyl  10 mg Rectal Once   diclofenac Sodium  4 g Topical TID   enoxaparin (LOVENOX) injection  30 mg Subcutaneous Q24H   feeding supplement (GLUCERNA SHAKE)  237 mL Oral TID BM   furosemide  60 mg Intravenous 2 times per day   guaiFENesin  600 mg Oral BID   hydrALAZINE  100 mg Oral TID   insulin aspart  0-15 Units Subcutaneous TID WC   insulin aspart  0-5 Units Subcutaneous QHS   insulin aspart  3 Units Subcutaneous TID WC   insulin glargine-yfgn  25 Units Subcutaneous BID  ipratropium-albuterol  3 mL Nebulization QID   lidocaine  2 patch Transdermal Q24H   metoprolol tartrate  100 mg Oral BID   multivitamin with minerals  1 tablet Oral Daily   pantoprazole  40 mg Oral Daily   senna-docusate  1 tablet Oral BID   sodium chloride flush  3 mL Intravenous Q12H   zinc sulfate  220 mg Oral Daily   Continuous Infusions:  albumin human       LOS: 2 days    Time spent:    Zannie Cove, MD [facility]   10/30/2022, 11:31 AM

## 2022-10-30 NOTE — Progress Notes (Signed)
   10/30/22 0040  BiPAP/CPAP/SIPAP  BiPAP/CPAP/SIPAP Pt Type Adult  BiPAP/CPAP/SIPAP SERVO  Mask Type Full face mask  Mask Size Medium  Set Rate 14 breaths/min  Respiratory Rate 28 breaths/min  IPAP 12 cmH20  EPAP 5 cmH2O  PEEP 5 cmH20  FiO2 (%) 40 %  Leak 20  Peak Inspiratory Pressure (PIP) 15  Patient Home Equipment No  Auto Titrate No  Press High Alarm 25 cmH2O  BiPAP/CPAP /SiPAP Vitals  Pulse Rate 85  Resp (!) 28  SpO2 100 %  Bilateral Breath Sounds Expiratory wheezes;Diminished  MEWS Score/Color  MEWS Score 3  MEWS Score Color Yellow   Patient placed on BiPAP at this time due to increase WOB. Breathing treatment given inline also. Patient breathing improved after being placed on BiPAP. RN aware.

## 2022-10-30 NOTE — Progress Notes (Signed)
Patient taken off BiPAP per patient request. Patient off for 40 minutes until requesting to be placed back on due to difficulty breathing. Patient with relief once placed back on BiPAP. RN aware.

## 2022-10-30 NOTE — Consult Note (Signed)
WOC Nurse wound follow up Wound type: Right gluteal abscess, NPWT in place.  Measurement:will assess at next dressing change.  Dressing procedure/placement/frequency: Switched from home unit to hospital unit.  Seal achieved at 125 mmHg.  Patient sitting up on side of bed with daughter at bedside.  Home unit packed in patient belongings bag and instructed it can go home until day of discharge for safekeeping.  Will change Tuesday and Friday this week.   Mike Gip MSN, RN, FNP-BC CWON Wound, Ostomy, Continence Nurse Outpatient [facility] [phone] Pager [phone]

## 2022-10-30 NOTE — Plan of Care (Signed)

## 2022-10-30 NOTE — Progress Notes (Signed)
Physical Therapy Treatment Patient Details Name: [name] [name] MRN: [id number] DOB: 1932-05-24 Today's Date: 10/30/2022   History of Present Illness Pt is 89 year old presented to [facility] on  10/28/22 for SOB and progressive edema. Pt with acute on chronic diastolic chf. Pt also with rt gluteal abscess with necrosis that has wound VAC in place.  PMH - chf, DM, CVA CKD, htn, obesity    PT Comments  Pt received sitting in the recliner and agreeable to session. Pt able to tolerate 3 standing trials from recliner to stedy progressing from mod A to CGA. Pt able to demonstrate increased standing tolerance and ability to march with stedy support. Pt continues to be limited by fatigue and DOE requiring frequent rest breaks. Pt able to perform seated exercises with cues for technique with encouragement to perform independently for increased strengthening. Pt continues to benefit from PT services to progress toward functional mobility goals.     If plan is discharge home, recommend the following: Two people to help with walking and/or transfers;Two people to help with bathing/dressing/bathroom;Assistance with cooking/housework;Assist for transportation;Help with stairs or ramp for entrance   Can travel by private vehicle        Equipment Recommendations  None recommended by PT    Recommendations for Other Services       Precautions / Restrictions Precautions Precautions: Fall Restrictions Weight Bearing Restrictions: No     Mobility  Bed Mobility               General bed mobility comments: Pt beginning and ending session in recliner    Transfers Overall transfer level: Needs assistance Equipment used: Ambulation equipment used Transfers: Sit to/from Stand Sit to Stand: Mod assist, Contact guard assist           General transfer comment: STS from recliner to stedy x3 with mod A during first trial progressing to CGA for last 2 trials. Transfer via Lift Equipment:  Stedy  Ambulation/Gait             Pre-gait activities: marching in stedy x2 trials        Balance Overall balance assessment: Needs assistance Sitting-balance support: Feet supported, Single extremity supported Sitting balance-Leahy Scale: Fair Sitting balance - Comments: sitting in recliner   Standing balance support: Bilateral upper extremity supported, During functional activity, Reliant on assistive device for balance Standing balance-Leahy Scale: Poor Standing balance comment: with stedy support and CGA for safety                            Cognition Arousal: Alert Behavior During Therapy: WFL for tasks assessed/performed Overall Cognitive Status: Within Functional Limits for tasks assessed                                 General Comments: some expressive issues intermittently, but appropriate responses and command following        Exercises General Exercises - Lower Extremity Ankle Circles/Pumps: AROM, Seated, Both, 10 reps Quad Sets: AROM, Seated, Both, 5 reps Long Arc Quad: AROM, Seated, Both, 10 reps Hip Flexion/Marching: AROM, Seated, Both, 10 reps    General Comments General comments (skin integrity, edema, etc.): Pt on 5L O2 throughout session with pt demonstrating DOE requiring frequent rest breaks      Pertinent Vitals/Pain Pain Assessment Pain Assessment: Faces Faces Pain Scale: Hurts little more Pain Location: generalized with mobility  Pain Descriptors / Indicators: Grimacing, Guarding Pain Intervention(s): Monitored during session, Repositioned     PT Goals (current goals can now be found in the care plan section) Acute Rehab PT Goals Patient Stated Goal: go home PT Goal Formulation: With patient/family Time For Goal Achievement: 11/12/22 Potential to Achieve Goals: Fair Progress towards PT goals: Progressing toward goals    Frequency    Min 1X/week      PT Plan         AM-PAC PT "6 Clicks" Mobility    Outcome Measure  Help needed turning from your back to your side while in a flat bed without using bedrails?: Total Help needed moving from lying on your back to sitting on the side of a flat bed without using bedrails?: Total Help needed moving to and from a bed to a chair (including a wheelchair)?: A Lot Help needed standing up from a chair using your arms (e.g., wheelchair or bedside chair)?: A Little Help needed to walk in hospital room?: Total Help needed climbing 3-5 steps with a railing? : Total 6 Click Score: 9    End of Session Equipment Utilized During Treatment: Oxygen;Gait belt Activity Tolerance: Patient limited by fatigue;Patient tolerated treatment well Patient left: in chair;with family/visitor present;with call bell/phone within reach Nurse Communication: Mobility status;Need for lift equipment PT Visit Diagnosis: Other abnormalities of gait and mobility (R26.89);Muscle weakness (generalized) (M62.81);Difficulty in walking, not elsewhere classified (R26.2);Pain     Time: 8413-2440 PT Time Calculation (min) (ACUTE ONLY): 34 min  Charges:    $Therapeutic Exercise: 8-22 mins $Therapeutic Activity: 8-22 mins PT General Charges $$ ACUTE PT VISIT: 1 Visit                     Johny Shock, PTA Acute Rehabilitation Services Secure Chat Preferred  Office:([phone]    Johny Shock 10/30/2022, 3:29 PM

## 2022-10-30 NOTE — Progress Notes (Addendum)
Rounding Note    Patient Name: [name] [name] Date of Encounter: 10/30/2022  [facility] Cardiologist: None   Subjective   Net negative 2.9L yesterday, negative 4.3L on admission.  Creatinine stable at 1.09.  BP 177/65.  She is on 8 L HFNC this morning.  Reports dyspnea improving but remains short of breath.  Denies any chest pain.  Inpatient Medications    Scheduled Meds:  amantadine  100 mg Oral Daily   ascorbic acid  500 mg Oral Daily   aspirin EC  81 mg Oral Daily   atorvastatin  40 mg Oral Daily   bisacodyl  10 mg Rectal Once   diclofenac Sodium  4 g Topical TID   docusate sodium  100 mg Oral BID   enoxaparin (LOVENOX) injection  30 mg Subcutaneous Q24H   feeding supplement (GLUCERNA SHAKE)  237 mL Oral TID BM   furosemide  60 mg Intravenous 2 times per day   guaiFENesin  600 mg Oral BID   hydrALAZINE  100 mg Oral TID   insulin aspart  0-15 Units Subcutaneous TID WC   insulin aspart  0-5 Units Subcutaneous QHS   insulin aspart  3 Units Subcutaneous TID WC   insulin glargine-yfgn  25 Units Subcutaneous BID   ipratropium-albuterol  3 mL Nebulization QID   lidocaine  2 patch Transdermal Q24H   metoprolol tartrate  100 mg Oral BID   multivitamin with minerals  1 tablet Oral Daily   pantoprazole  40 mg Oral Daily   senna-docusate  1 tablet Oral BID   sodium chloride flush  3 mL Intravenous Q12H   zinc sulfate  220 mg Oral Daily   Continuous Infusions:  albumin human     PRN Meds: acetaminophen **OR** acetaminophen, albuterol, hydrALAZINE, methocarbamol, ondansetron **OR** ondansetron (ZOFRAN) IV, oxyCODONE, polyethylene glycol, QUEtiapine, traZODone   Vital Signs    Vitals:   10/30/22 0458 10/30/22 0545 10/30/22 0620 10/30/22 0756  BP:    (!) 177/65  Pulse:  83 89 85  Resp:   (!) 26   Temp:    98.7 F (37.1 C)  TempSrc:    Axillary  SpO2:  94% 96% 97%  Weight: 104.8 kg       Intake/Output Summary (Last 24 hours) at 10/30/2022 0934 Last data  filed at 10/30/2022 3244 Gross per 24 hour  Intake 500 ml  Output 4205 ml  Net -3705 ml      10/30/2022    4:58 AM 10/29/2022    5:16 AM 09/22/2022    6:06 AM  Last 3 Weights  Weight (lbs) 231 lb 217 lb 13 oz 295 lb  Weight (kg) 104.781 kg 98.8 kg 133.811 kg      Telemetry    Sinus rhythm- Personally Reviewed  ECG    None new- Personally Reviewed  Physical Exam   GEN: No acute distress.   Neck: +JVD Cardiac: RRR, no murmurs, rubs, or gallops.  Respiratory: Diffuse wheezing GI: Soft, nontender, non-distended  MS: 1-2+ edema; No deformity. Neuro:  Nonfocal  Psych: Normal affect   Labs    High Sensitivity Troponin:   Recent Labs  Lab 10/28/22 1148 10/28/22 1337  TROPONINIHS 24* 23*     Chemistry Recent Labs  Lab 10/28/22 1147 10/29/22 0332 10/30/22 0813  NA 139 141 143  K 4.2 4.4 3.8  CL 104 101 96*  CO2 26 26 29   GLUCOSE 190* 367* 169*  BUN 21 19 21   CREATININE 1.05* 1.25*  1.09*  CALCIUM 7.7* 8.1* 8.5*  PROT  --   --  6.4*  ALBUMIN  --  2.2* 2.5*  AST  --   --  27  ALT  --   --  16  ALKPHOS  --   --  70  BILITOT  --   --  0.6  GFRNONAA 51* 41* 49*  ANIONGAP 9 14 18*    Lipids No results for input(s): "CHOL", "TRIG", "HDL", "LABVLDL", "LDLCALC", "CHOLHDL" in the last 168 hours.  Hematology Recent Labs  Lab 10/28/22 1147 10/29/22 0332 10/30/22 0813  WBC 5.3 3.0* 9.1  RBC 3.67* 3.89 4.45  HGB 9.4* 9.9* 11.2*  HCT 31.5* 33.0* 37.1  MCV 85.8 84.8 83.4  MCH 25.6* 25.4* 25.2*  MCHC 29.8* 30.0 30.2  RDW 20.9* 20.8* 21.2*  PLT 261 268 291   Thyroid No results for input(s): "TSH", "FREET4" in the last 168 hours.  BNP Recent Labs  Lab 10/28/22 1147  BNP 728.8*    DDimer No results for input(s): "DDIMER" in the last 168 hours.   Radiology    DG Chest 2 View  Result Date: 10/28/2022 CLINICAL DATA:  Shortness of breath and wheezing. Recently diagnosed with pneumonia EXAM: CHEST - 2 VIEW COMPARISON:  09/16/2022 FINDINGS: AP and lateral views.  Midline trachea. Mild cardiomegaly. Atherosclerosis in the transverse aorta. Small bilateral pleural effusions. No pneumothorax. Mild interstitial edema. No lobar consolidation. Numerous leads and wires project over the chest. Mild similar bibasilar atelectasis. IMPRESSION: Similar appearance of congestive heart failure and small bilateral pleural effusions. Aortic Atherosclerosis (ICD10-I70.0). Electronically Signed   By: Jeronimo Greaves M.D.   On: 10/28/2022 12:13    Cardiac Studies   TTE 09/12/22  1. Left ventricular ejection fraction, by estimation, is 60 to 65%. The  left ventricle has normal function. The left ventricle has no regional  wall motion abnormalities. There is mild concentric left ventricular  hypertrophy. Left ventricular diastolic  parameters are consistent with Grade II diastolic dysfunction  (pseudonormalization).   2. Right ventricular systolic function is normal. The right ventricular  size is normal. There is mildly elevated pulmonary artery systolic  pressure. The estimated right ventricular systolic pressure is 39.8 mmHg.   3. Left atrial size was moderately dilated.   4. Right atrial size was mild to moderately dilated.   5. The mitral valve is degenerative. Mild mitral valve regurgitation. No  evidence of mitral stenosis. Moderate mitral annular calcification.   6. The aortic valve is tricuspid. There is mild calcification of the  aortic valve. Aortic valve regurgitation is trivial. Aortic valve  sclerosis/calcification is present, without any evidence of aortic  stenosis.   7. The inferior vena cava is dilated in size with >50% respiratory  variability, suggesting right atrial pressure of 8 mmHg.   Patient Profile     89 y.o. female history of diabetes, CVA, hypertension, CKD stage IIIb presented to hospital with shortness of breath and diastolic heart failure.  Assessment & Plan    Acute on chronic diastolic heart failure: Echo 09/12/2022 showed EF 60 to 65%,  grade 2 diastolic dysfunction, normal RV function, moderate biatrial enlargement.  Presented with volume overload, BNP 729.  Low albumin also contributing to volume overload -Diuresing well, continue IV Lasix 60 mg twice daily.  Acute hypoxic respiratory failure: Likely multifactorial with acute on chronic diastolic heart failure and asthma exacerbation contributing.  Continue IV Lasix as above.  Treatment of asthma per primary team.  Hypertension: BP elevated this  morning but had soft BP overnight, will monitor.  Continue metoprolol 100 mg twice daily and hydralazine 100 mg 3 times daily.  Hyperlipidemia: Continue Lipitor  Diabetes: A1c 9.8, management per primary team      For questions or updates, please contact [facility] Please consult [email or web address] for contact info under        Signed, Little Ishikawa, MD  10/30/2022, 9:34 AM

## 2022-10-31 ENCOUNTER — Encounter: Payer: Medicare Other | Admitting: Physical Medicine and Rehabilitation

## 2022-10-31 DIAGNOSIS — I10 Essential (primary) hypertension: Secondary | ICD-10-CM | POA: Diagnosis not present

## 2022-10-31 DIAGNOSIS — I5033 Acute on chronic diastolic (congestive) heart failure: Secondary | ICD-10-CM | POA: Diagnosis not present

## 2022-10-31 DIAGNOSIS — J9601 Acute respiratory failure with hypoxia: Secondary | ICD-10-CM | POA: Diagnosis not present

## 2022-10-31 LAB — BASIC METABOLIC PANEL
Anion gap: 12 (ref 5–15)
BUN: 21 mg/dL (ref 8–23)
CO2: 34 mmol/L — ABNORMAL HIGH (ref 22–32)
Calcium: 8.4 mg/dL — ABNORMAL LOW (ref 8.9–10.3)
Chloride: 96 mmol/L — ABNORMAL LOW (ref 98–111)
Creatinine, Ser: 1.26 mg/dL — ABNORMAL HIGH (ref 0.44–1.00)
GFR, Estimated: 41 mL/min — ABNORMAL LOW (ref >60)
Glucose, Bld: 130 mg/dL — ABNORMAL HIGH (ref 70–99)
Potassium: 3.6 mmol/L (ref 3.5–5.1)
Sodium: 142 mmol/L (ref 135–145)

## 2022-10-31 LAB — GLUCOSE, CAPILLARY
Glucose-Capillary: 112 mg/dL — ABNORMAL HIGH (ref 70–99)
Glucose-Capillary: 179 mg/dL — ABNORMAL HIGH (ref 70–99)
Glucose-Capillary: 169 mg/dL — ABNORMAL HIGH (ref 70–99)
Glucose-Capillary: 180 mg/dL — ABNORMAL HIGH (ref 70–99)

## 2022-10-31 LAB — HEMOGLOBIN AND HEMATOCRIT, BLOOD
HCT: 31.7 % — ABNORMAL LOW (ref 36.0–46.0)
Hemoglobin: 9.7 g/dL — ABNORMAL LOW (ref 12.0–15.0)

## 2022-10-31 LAB — MAGNESIUM: Magnesium: 1.4 mg/dL — ABNORMAL LOW (ref 1.7–2.4)

## 2022-10-31 MED ORDER — IPRATROPIUM-ALBUTEROL 0.5-2.5 (3) MG/3ML IN SOLN
3.0000 mL | Freq: Three times a day (TID) | RESPIRATORY_TRACT | Status: DC
  Administered 2022-10-31 – 2022-11-02 (×6): 3 mL via RESPIRATORY_TRACT
  Filled 2022-10-31 (×6): qty 3

## 2022-10-31 MED ORDER — METOLAZONE 5 MG PO TABS
5.0000 mg | ORAL_TABLET | Freq: Once | ORAL | Status: AC
  Administered 2022-10-31: 5 mg via ORAL
  Filled 2022-10-31: qty 1

## 2022-10-31 MED ORDER — POTASSIUM CHLORIDE CRYS ER 20 MEQ PO TBCR
40.0000 meq | EXTENDED_RELEASE_TABLET | Freq: Two times a day (BID) | ORAL | Status: AC
  Administered 2022-10-31 (×2): 40 meq via ORAL
  Filled 2022-10-31 (×2): qty 2

## 2022-10-31 MED ORDER — ALBUMIN HUMAN 25 % IV SOLN
25.0000 g | Freq: Four times a day (QID) | INTRAVENOUS | Status: AC
  Administered 2022-10-31 (×2): 25 g via INTRAVENOUS
  Filled 2022-10-31 (×2): qty 100

## 2022-10-31 NOTE — Progress Notes (Addendum)
Physical Therapy Treatment Patient Details Name: [name] [name] MRN: [id number] DOB: October 14, 1932 Today's Date: 10/31/2022   History of Present Illness Pt is 89 year old presented to [facility] on  10/28/22 for SOB and progressive edema. Pt with acute on chronic diastolic chf. Pt also with rt gluteal abscess with necrosis that has wound VAC in place.  PMH - chf, DM, CVA CKD, htn, obesity    PT Comments  Pt tolerated treatment session well today. Initial complaints of R knee pain at rest, however, pt did not have symptoms during the session. Worked on LE strength and ability to transfer using Stedy and MinAx2 to CGAx2 for safety. Pt was able to barely clear B feet off the ground with weight shifting in standing. Pt had increased difficulty bearing weight on R LE to lift L foot. Pt is progressing well towards goals. Acute PT to follow.    If plan is discharge home, recommend the following: Two people to help with walking and/or transfers;Two people to help with bathing/dressing/bathroom;Assistance with cooking/housework;Assist for transportation;Help with stairs or ramp for entrance   Can travel by private vehicle        Equipment Recommendations  None recommended by PT    Recommendations for Other Services       Precautions / Restrictions Precautions Precautions: Fall Restrictions Weight Bearing Restrictions: No     Mobility  Bed Mobility Overal bed mobility:  (pt in chair upon arrival, returned to chair)                  Transfers Overall transfer level: Needs assistance Equipment used: Ambulation equipment used   Sit to Stand: Min assist, Contact guard assist, +2 safety/equipment           General transfer comment: initial STS from recliner to stedy with MinA x2 for safety, assistance for initial rise, CGA x2 for safety for last 2 trials. Cues for upright posture Transfer via Lift Equipment: Stedy      Balance Overall balance assessment: Needs  assistance Sitting-balance support: Feet supported Sitting balance-Leahy Scale: Fair Sitting balance - Comments: sitting in recliner   Standing balance support: Bilateral upper extremity supported, During functional activity, Reliant on assistive device for balance Standing balance-Leahy Scale: Poor Standing balance comment: with stedy support and CGA for safety                            Cognition Arousal: Alert Behavior During Therapy: WFL for tasks assessed/performed Overall Cognitive Status: Within Functional Limits for tasks assessed                                          Exercises General Exercises - Lower Extremity Ankle Circles/Pumps: AROM, 15 reps, Both, Seated Long Arc Quad: AROM, Seated, Both, 15 reps Hip Flexion/Marching: AROM, Seated, Both, 10 reps Other Exercises Other Exercises: B weight shifting in standing with stedy x8; B marching in standing with minimal foot clearance x3    General Comments General comments (skin integrity, edema, etc.): VSS on 3 L [place]      Pertinent Vitals/Pain Pain Assessment Pain Assessment: Faces Faces Pain Scale: Hurts little more Pain Location: R knee Pain Descriptors / Indicators: Grimacing, Guarding Pain Intervention(s): Limited activity within patient's tolerance, Monitored during session, Repositioned     PT Goals (current goals can now be found in the  care plan section) Acute Rehab PT Goals Patient Stated Goal: go home PT Goal Formulation: With patient/family Time For Goal Achievement: 11/12/22 Potential to Achieve Goals: Fair Progress towards PT goals: Progressing toward goals    Frequency    Min 1X/week       AM-PAC PT "6 Clicks" Mobility   Outcome Measure  Help needed turning from your back to your side while in a flat bed without using bedrails?: None Help needed moving from lying on your back to sitting on the side of a flat bed without using bedrails?: None Help needed moving  to and from a bed to a chair (including a wheelchair)?: A Lot Help needed standing up from a chair using your arms (e.g., wheelchair or bedside chair)?: A Little Help needed to walk in hospital room?: Total Help needed climbing 3-5 steps with a railing? : Total 6 Click Score: 15    End of Session Equipment Utilized During Treatment: Oxygen Activity Tolerance: Patient limited by fatigue;Patient tolerated treatment well Patient left: in chair;with family/visitor present;with call bell/phone within reach (no chair alarm upon entry, family in room to ensure pt doesn't get up without staff assist) Nurse Communication: Mobility status PT Visit Diagnosis: Other abnormalities of gait and mobility (R26.89);Muscle weakness (generalized) (M62.81);Difficulty in walking, not elsewhere classified (R26.2)     Time: 2952-8413 PT Time Calculation (min) (ACUTE ONLY): 29 min  Charges:    $Therapeutic Exercise: 8-22 mins $Therapeutic Activity: 8-22 mins PT General Charges $$ ACUTE PT VISIT: 1 Visit                     Hilton Cork, PT, DPT Secure Chat Preferred  Rehab Office [phone]    Arturo Morton Brion Aliment 10/31/2022, 3:34 PM

## 2022-10-31 NOTE — TOC Initial Note (Addendum)
Transition of Care [facility]) - Initial/Assessment Note    Patient Details  Name: [name] [name] MRN: [id number] Date of Birth: 01/03/1933  Transition of Care [facility]) CM/SW Contact:    Leone Haven, RN Phone Number: 10/31/2022, 6:09 PM  Clinical Narrative:                 From home with family,  has PCP and insurance on file, states has no HH services in place at this time she has all DME needed.  States family member will transport them home at [employer] and family is support system, states [facility] [place].  Pta wheelchair.  Per pt eval rec HHPT, HHOT.  NCM spoke with daughter in room she called her brother and he wanted to know what other options does patient have at home.  NCM informed him that she has HH , private duty care.  He was saying that she is two plus ast.  She also has a wound vac that is changed on MWF.  Son states that Cindie Laroche was working on setting up [facility] services  for [employer], HHPT, HHOT for patient when she came from [facility].     Expected Discharge Plan: Home w Home Health Services Barriers to Discharge: Continued Medical Work up   Patient Goals and CMS Choice Patient states their goals for this hospitalization and ongoing recovery are:: return home   Choice offered to / list presented to : NA      Expected Discharge Plan and Services In-house Referral: NA Discharge Planning Services: CM Consult Post Acute Care Choice: Home Health Living arrangements for the past 2 months: Single Family Home                 DME Arranged: N/A DME Agency: NA       HH Arranged: PT, OT          Prior Living Arrangements/Services Living arrangements for the past 2 months: Single Family Home Lives with:: Adult Children Patient language and need for interpreter reviewed:: Yes Do you feel safe going back to the place where you live?: Yes      Need for Family Participation in Patient Care: Yes (Comment) Care giver support system in place?: Yes  (comment) Current home services: DME (wheelchair, electric w/chair, a lift chair, home oxygen 2 liters with adapt) Criminal Activity/Legal Involvement Pertinent to Current Situation/Hospitalization: No - Comment as needed  Activities of Daily Living      Permission Sought/Granted Permission sought to share information with : Case Manager Permission granted to share information with : Yes, Verbal Permission Granted              Emotional Assessment Appearance:: Appears stated age     Orientation: : Oriented to Self, Oriented to Place, Oriented to  Time, Oriented to Situation Alcohol / Substance Use: Not Applicable Psych Involvement: No (comment)  Admission diagnosis:  Hypoxia [R09.02] Acute on chronic combined systolic (congestive) and diastolic (congestive) heart failure (HCC) [I50.43] Acute on chronic congestive heart failure, unspecified heart failure type (HCC) [I50.9] Patient Active Problem List   Diagnosis Date Noted   Acute respiratory failure with hypoxia (HCC) 10/30/2022   Acute on chronic diastolic CHF (congestive heart failure) (HCC) 10/28/2022   Left lower lobe pneumonia 10/28/2022   Toxic encephalopathy 09/17/2022   AKI (acute kidney injury) (HCC) 09/13/2022   COVID-19 virus infection 09/13/2022   Delirium 09/13/2022   Gluteal abscess 09/08/2022   Moderate nonproliferative diabetic retinopathy of  both eyes without macular edema associated with type 2 diabetes mellitus (HCC) 07/18/2021   Benign essential HTN    Primary osteoarthritis of right knee    Slow transit constipation    Labile blood pressure    Controlled type 2 diabetes mellitus with hyperglycemia (HCC)    Labile blood glucose    Acute renal failure superimposed on chronic kidney disease (HCC)    Anxiety state    Nontraumatic acute hemorrhage of basal ganglia (HCC) 01/16/2020   Lethargy    Dysphagia    Leukocytosis    Chronic kidney disease (CKD), stage III (moderate) (HCC) 01/10/2020   Morbid  obesity (HCC)    Chronic diastolic congestive heart failure (HCC)    Uncontrolled type 2 diabetes mellitus with hyperglycemia (HCC)    Essential hypertension    FUO (fever of unknown origin)    ICH (intracerebral hemorrhage) (HCC) 01/04/2020   Left epiretinal membrane 12/10/2019   Pseudophakia, both eyes 12/10/2019   DIABETES MELLITUS-TYPE II 06/01/2009   Dyslipidemia 06/01/2009   HYPERTENSION 06/01/2009   CHOLELITHIASIS 06/01/2009   PANCREATITIS 06/01/2009   PERSONAL HX COLONIC POLYPS 06/01/2009   PCP:  [name], MD Pharmacy:   [facility], [place] - [address] [place] [address] Phone: [phone] Fax: [phone]  [id number] [place], [place] - [address] [place] [address] Phone: [phone] Fax: [phone]     Social Determinants of Health (SDOH) Social History: SDOH Screenings   Food Insecurity: No Food Insecurity (09/08/2022)  Housing: Low Risk  (09/08/2022)  Transportation Needs: No Transportation Needs (09/08/2022)  Utilities: Not At Risk (09/08/2022)  Depression (PHQ2-9): Low Risk  (03/27/2022)  Financial Resource Strain: Low Risk  (06/14/2022)   Received from [facility], [facility]  Physical Activity: Inactive (05/19/2019)   Received from [facility], [facility]  Social Connections: Socially Integrated (12/05/2021)   Received from [facility]  Stress: No Stress Concern Present (12/05/2021)   Received from [facility], [facility]  Tobacco Use: Low Risk  (10/28/2022)   SDOH Interventions:     Readmission Risk Interventions    10/31/2022    6:04 PM  Readmission Risk Prevention Plan  Transportation Screening Complete  Medication Review (RN Care Manager) Complete  HRI or Home Care Consult Complete

## 2022-10-31 NOTE — Progress Notes (Signed)
Progress Note  Patient Name: [name] [name] Date of Encounter: 10/31/2022  Primary Cardiologist: None   Subjective   Patient seen examined at her bedside. She was sitting in her chair with her family.  She offers no complaints at this time.  Inpatient Medications    Scheduled Meds:  amantadine  100 mg Oral Daily   ascorbic acid  500 mg Oral Daily   aspirin EC  81 mg Oral Daily   atorvastatin  40 mg Oral Daily   bisacodyl  10 mg Rectal Once   diclofenac Sodium  2 g Topical TID   enoxaparin (LOVENOX) injection  30 mg Subcutaneous Q24H   feeding supplement (GLUCERNA SHAKE)  237 mL Oral TID BM   furosemide  60 mg Intravenous 2 times per day   guaiFENesin  600 mg Oral BID   hydrALAZINE  100 mg Oral TID   insulin aspart  0-15 Units Subcutaneous TID WC   insulin aspart  0-5 Units Subcutaneous QHS   insulin aspart  3 Units Subcutaneous TID WC   insulin glargine-yfgn  25 Units Subcutaneous BID   ipratropium-albuterol  3 mL Nebulization QID   lidocaine  2 patch Transdermal Q24H   metoprolol tartrate  100 mg Oral BID   multivitamin with minerals  1 tablet Oral Daily   pantoprazole  40 mg Oral Daily   senna-docusate  1 tablet Oral BID   sodium chloride flush  3 mL Intravenous Q12H   spironolactone  25 mg Oral Daily   zinc sulfate  220 mg Oral Daily   Continuous Infusions:  albumin human 25 g (10/31/22 0834)   PRN Meds: acetaminophen **OR** acetaminophen, albuterol, hydrALAZINE, methocarbamol, ondansetron **OR** ondansetron (ZOFRAN) IV, oxyCODONE, polyethylene glycol, QUEtiapine, traZODone   Vital Signs    Vitals:   10/30/22 2337 10/31/22 0348 10/31/22 0454 10/31/22 0900  BP: (!) 139/59 (!) 170/84    Pulse: 76 78    Resp:  19  18  Temp: (!) 97.2 F (36.2 C) 98.2 F (36.8 C)    TempSrc: Axillary Oral    SpO2: 98% 100%    Weight:   104.3 kg     Intake/Output Summary (Last 24 hours) at 10/31/2022 0953 Last data filed at 10/31/2022 0856 Gross per 24 hour  Intake 300 ml   Output 1115 ml  Net -815 ml   Filed Weights   10/29/22 0516 10/30/22 0458 10/31/22 0454  Weight: 98.8 kg 104.8 kg 104.3 kg    Telemetry     - Personally Reviewed  ECG     - Personally Reviewed  Physical Exam    General: Comfortable Head: Atraumatic, normal size  Eyes: PEERLA, EOMI  Neck: Supple, normal JVD Cardiac: Normal S1, S2; RRR; no murmurs, rubs, or gallops Lungs: Clear to auscultation bilaterally Abd: Soft, nontender, no hepatomegaly  Ext: warm, no edema Musculoskeletal: No deformities, BUE and BLE strength normal and equal Skin: Warm and dry, no rashes   Neuro: Alert and oriented to person, place, time, and situation, CNII-XII grossly intact, no focal deficits  Psych: Normal mood and affect   Labs    Chemistry Recent Labs  Lab 10/29/22 0332 10/30/22 0813 10/31/22 0410  NA 141 143 142  K 4.4 3.8 3.6  CL 101 96* 96*  CO2 26 29 34*  GLUCOSE 367* 169* 130*  BUN 19 21 21   CREATININE 1.25* 1.09* 1.26*  CALCIUM 8.1* 8.5* 8.4*  PROT  --  6.4*  --   ALBUMIN 2.2* 2.5*  --  AST  --  27  --   ALT  --  16  --   ALKPHOS  --  70  --   BILITOT  --  0.6  --   GFRNONAA 41* 49* 41*  ANIONGAP 14 18* 12     Hematology Recent Labs  Lab 10/28/22 1147 10/29/22 0332 10/30/22 0813 10/31/22 0410  WBC 5.3 3.0* 9.1  --   RBC 3.67* 3.89 4.45  --   HGB 9.4* 9.9* 11.2* 9.7*  HCT 31.5* 33.0* 37.1 31.7*  MCV 85.8 84.8 83.4  --   MCH 25.6* 25.4* 25.2*  --   MCHC 29.8* 30.0 30.2  --   RDW 20.9* 20.8* 21.2*  --   PLT 261 268 291  --     Cardiac EnzymesNo results for input(s): "TROPONINI" in the last 168 hours. No results for input(s): "TROPIPOC" in the last 168 hours.   BNP Recent Labs  Lab 10/28/22 1147  BNP 728.8*     DDimer No results for input(s): "DDIMER" in the last 168 hours.   Radiology    No results found.  Cardiac Studies   TTE 09/12/2022 IMPRESSIONS   1. Left ventricular ejection fraction, by estimation, is 60 to 65%. The left ventricle has  normal function. The left ventricle has no regional wall motion abnormalities. There is mild concentric left ventricular  hypertrophy. Left ventricular diastolic parameters are consistent with Grade II diastolic dysfunction  (pseudonormalization).   2. Right ventricular systolic function is normal. The right ventricular size is normal. There is mildly elevated pulmonary artery systolic pressure. The estimated right ventricular systolic pressure is 39.8 mmHg.   3. Left atrial size was moderately dilated.   4. Right atrial size was mild to moderately dilated.   5. The mitral valve is degenerative. Mild mitral valve regurgitation. No vidence of mitral stenosis. Moderate mitral annular calcification.   6. The aortic valve is tricuspid. There is mild calcification of the  aortic valve. Aortic valve regurgitation is trivial. Aortic valve  sclerosis/calcification is present, without any evidence of aortic  stenosis.   7. The inferior vena cava is dilated in size with >50% respiratory  variability, suggesting right atrial pressure of 8 mmHg.   Conclusion(s)/Recommendation(s): No evidence of valvular vegetations on  this transthoracic echocardiogram. Consider a transesophageal  echocardiogram to exclude infective endocarditis if clinically indicated.   FINDINGS   Left Ventricle: Left ventricular ejection fraction, by estimation, is 60  to 65%. The left ventricle has normal function. The left ventricle has no  regional wall motion abnormalities. Definity contrast agent was given IV  to delineate the left ventricular   endocardial borders. The left ventricular internal cavity size was normal  in size. There is mild concentric left ventricular hypertrophy. Left  ventricular diastolic parameters are consistent with Grade II diastolic  dysfunction (pseudonormalization).   Right Ventricle: The right ventricular size is normal. No increase in  right ventricular wall thickness. Right ventricular systolic  function is  normal. There is mildly elevated pulmonary artery systolic pressure. The  tricuspid regurgitant velocity is 2.82   m/s, and with an assumed right atrial pressure of 8 mmHg, the estimated  right ventricular systolic pressure is 39.8 mmHg.   Left Atrium: Left atrial size was moderately dilated.   Right Atrium: Right atrial size was mild to moderately dilated.   Pericardium: There is no evidence of pericardial effusion.   Mitral Valve: The mitral valve is degenerative in appearance. Moderate  mitral annular calcification. Mild mitral  valve regurgitation. No evidence  of mitral valve stenosis. MV peak gradient, 9.7 mmHg. The mean mitral  valve gradient is 4.0 mmHg.   Tricuspid Valve: The tricuspid valve is normal in structure. Tricuspid  valve regurgitation is mild . No evidence of tricuspid stenosis.   Aortic Valve: The aortic valve is tricuspid. There is mild calcification  of the aortic valve. Aortic valve regurgitation is trivial. Aortic valve  sclerosis/calcification is present, without any evidence of aortic  stenosis.   Pulmonic Valve: The pulmonic valve was not well visualized. Pulmonic valve  regurgitation is trivial. No evidence of pulmonic stenosis.   Aorta: The aortic root is normal in size and structure.   Venous: The inferior vena cava is dilated in size with greater than 50%  respiratory variability, suggesting right atrial pressure of 8 mmHg.   IAS/Shunts: No atrial level shunt detected by color flow Doppler.    Patient Profile     89 y.o. female diabetes mellitus, CVA, hypertension, CKD stage IIIb has been admitted for acute on chronic diastolic heart failure  Assessment & Plan  Acute on chronic diastolic heart failure Hypoxic respiratory failure Hypertension Hyperlipidemia Diabetes mellitus  Clinically she appears to be volume overloaded.  Will continue her Lasix at this time.  Had a negative balance. Her respiratory failure seems to be  improving.  Blood pressure is elevated.  Will adjust her antihypertensive medications with increasing her hydralazine to 150 mg every 8 hr Wound VAC is being managed by the primary team.   For questions or updates, please contact [facility] Please consult [email or web address] for contact info under Cardiology/STEMI.      Signed, Thomasene Ripple, DO  10/31/2022, 9:53 AM

## 2022-10-31 NOTE — Progress Notes (Signed)
PROGRESS NOTE    [name] [name]  ZOX:[id number] DOB: 1932/04/27 DOA: 10/28/2022 PCP: [name], MD  89/F with history of chronic diastolic CHF, type 2 diabetes mellitus, CVA, dyslipidemia, CKD 3B, obesity, hypertension was admitted 7/12-26 for right gluteal abscess with wet gangrene and necrosis complicated by coag negative staph bacteremia, underwent I&D and debridement per surgery, completed Unasyn 7/24 went to SNF for rehab with wound VAC.  While at SNF she was taken off Lasix on account of AKI, creatinine trended up to 3.5, subsequently discharged home from rehab 3 days ago, developed worsening shortness of breath and progressive edema In the ED tachypneic hypoxic, placed on BiPAP, labs with BNP 728, creatinine 1.05, hemoglobin 9.4,, chest x-ray with CHF and small pleural effusions -Slowly improving on diuretics, cards following  Subjective: -No events overnight, had a better night yesterday, still short of breath, slowly improving  Assessment and Plan:  Acute on chronic diastolic CHF - she is significantly volume overloaded has 2+ edema extending to abdomen, thighs -Required BiPAP on admission, and last night -Recent echo 7/24 noted EF of 60-65% with grade 2 diastolic dysfunction and normal RV -Taken off diuretics 3 weeks ago at [facility] on account of AKI -Cards following, remains massively volume overloaded, 5.8 L negative, continue IV Lasix with albumin today, Aldactone -Metolazone x 1 BMP in a.m.   R gluteal abscess with necrosis -s/p I&D, debridement -Continue wound VAC, WOC consulted   Asthma -I suspect her wheezes are cardiac, remains massively volume overloaded and 15 to 20 pounds weight gain -continue DuoNebs regardless, no indication for steroids now   Stage 3b CKD -Appears to be stable at this time -Stable, avoid hypotension and nephrotoxic meds   HTN -Continue home meds - hydralazine, metoprolol -Diuretics   HLD -Continue Lipitor   DM -Last A1c was 9.8, poor  control -Increase glargine, add meal coverage   Morbid obesity -Increased morbidity/mortality risk -Weight loss should be encouraged -Outpatient PCP/bariatric medicine f/u encouraged      Advance Care Planning: DNR  DVT Prophylaxis: Lovenox Family Communication: Family at bedside Disposition Plan: SNF likely in 3 to 4 days  Consultants:    Procedures:   Antimicrobials:    Objective: Vitals:   10/30/22 2337 10/31/22 0348 10/31/22 0454 10/31/22 0900  BP: (!) 139/59 (!) 170/84    Pulse: 76 78    Resp:  19  18  Temp: (!) 97.2 F (36.2 C) 98.2 F (36.8 C)    TempSrc: Axillary Oral    SpO2: 98% 100%    Weight:   104.3 kg     Intake/Output Summary (Last 24 hours) at 10/31/2022 1001 Last data filed at 10/31/2022 0856 Gross per 24 hour  Intake 300 ml  Output 1115 ml  Net -815 ml   Filed Weights   10/29/22 0516 10/30/22 0458 10/31/22 0454  Weight: 98.8 kg 104.8 kg 104.3 kg    Examination:  General exam: Morbidly obese chronically ill female sitting up in bed, AAOx3 HEENT: Positive JVD CVS: S1-S2, regular rhythm Lungs: Improving air movement, improved wheezes, Rales in lower lungs Abdomen: Soft, obese, nontender, flank edema Extremities: 2+ edema extending to upper thighs Sacrum, wound VAC noted  Psychiatry:  Mood & affect appropriate.     Data Reviewed:   CBC: Recent Labs  Lab 10/28/22 1147 10/29/22 0332 10/30/22 0813 10/31/22 0410  WBC 5.3 3.0* 9.1  --   HGB 9.4* 9.9* 11.2* 9.7*  HCT 31.5* 33.0* 37.1 31.7*  MCV 85.8 84.8 83.4  --  PLT 261 268 291  --    Basic Metabolic Panel: Recent Labs  Lab 10/28/22 1147 10/29/22 0332 10/30/22 0813 10/31/22 0410  NA 139 141 143 142  K 4.2 4.4 3.8 3.6  CL 104 101 96* 96*  CO2 26 26 29  34*  GLUCOSE 190* 367* 169* 130*  BUN 21 19 21 21   CREATININE 1.05* 1.25* 1.09* 1.26*  CALCIUM 7.7* 8.1* 8.5* 8.4*  MG  --   --   --  1.4*   GFR: Estimated Creatinine Clearance: 37.7 mL/min (A) (by C-G formula based on  SCr of 1.26 mg/dL (H)). Liver Function Tests: Recent Labs  Lab 10/29/22 0332 10/30/22 0813  AST  --  27  ALT  --  16  ALKPHOS  --  70  BILITOT  --  0.6  PROT  --  6.4*  ALBUMIN 2.2* 2.5*   No results for input(s): "LIPASE", "AMYLASE" in the last 168 hours. No results for input(s): "AMMONIA" in the last 168 hours. Coagulation Profile: No results for input(s): "INR", "PROTIME" in the last 168 hours. Cardiac Enzymes: No results for input(s): "CKTOTAL", "CKMB", "CKMBINDEX", "TROPONINI" in the last 168 hours. BNP (last 3 results) No results for input(s): "PROBNP" in the last 8760 hours. HbA1C: No results for input(s): "HGBA1C" in the last 72 hours. CBG: Recent Labs  Lab 10/30/22 0622 10/30/22 1203 10/30/22 1536 10/30/22 2113 10/31/22 0614  GLUCAP 185* 199* 226* 247* 112*   Lipid Profile: No results for input(s): "CHOL", "HDL", "LDLCALC", "TRIG", "CHOLHDL", "LDLDIRECT" in the last 72 hours. Thyroid Function Tests: No results for input(s): "TSH", "T4TOTAL", "FREET4", "T3FREE", "THYROIDAB" in the last 72 hours. Anemia Panel: No results for input(s): "VITAMINB12", "FOLATE", "FERRITIN", "TIBC", "IRON", "RETICCTPCT" in the last 72 hours. Urine analysis:    Component Value Date/Time   COLORURINE YELLOW 09/13/2022 1652   APPEARANCEUR CLEAR 09/13/2022 1652   LABSPEC 1.015 09/13/2022 1652   PHURINE 5.0 09/13/2022 1652   GLUCOSEU NEGATIVE 09/13/2022 1652   HGBUR SMALL (A) 09/13/2022 1652   BILIRUBINUR NEGATIVE 09/13/2022 1652   KETONESUR NEGATIVE 09/13/2022 1652   PROTEINUR 30 (A) 09/13/2022 1652   UROBILINOGEN 0.2 07/23/2009 1015   NITRITE NEGATIVE 09/13/2022 1652   LEUKOCYTESUR NEGATIVE 09/13/2022 1652   Sepsis Labs: @LABRCNTIP (procalcitonin:4,lacticidven:4)  )No results found for this or any previous visit (from the past 240 hour(s)).   Radiology Studies: No results found.   Scheduled Meds:  amantadine  100 mg Oral Daily   ascorbic acid  500 mg Oral Daily    aspirin EC  81 mg Oral Daily   atorvastatin  40 mg Oral Daily   bisacodyl  10 mg Rectal Once   diclofenac Sodium  2 g Topical TID   enoxaparin (LOVENOX) injection  30 mg Subcutaneous Q24H   feeding supplement (GLUCERNA SHAKE)  237 mL Oral TID BM   furosemide  60 mg Intravenous 2 times per day   guaiFENesin  600 mg Oral BID   hydrALAZINE  100 mg Oral TID   insulin aspart  0-15 Units Subcutaneous TID WC   insulin aspart  0-5 Units Subcutaneous QHS   insulin aspart  3 Units Subcutaneous TID WC   insulin glargine-yfgn  25 Units Subcutaneous BID   ipratropium-albuterol  3 mL Nebulization QID   lidocaine  2 patch Transdermal Q24H   metoprolol tartrate  100 mg Oral BID   multivitamin with minerals  1 tablet Oral Daily   pantoprazole  40 mg Oral Daily   senna-docusate  1 tablet  Oral BID   sodium chloride flush  3 mL Intravenous Q12H   spironolactone  25 mg Oral Daily   zinc sulfate  220 mg Oral Daily   Continuous Infusions:  albumin human 25 g (10/31/22 0834)     LOS: 3 days    Time spent:    Zannie Cove, MD [facility]   10/31/2022, 10:01 AM

## 2022-10-31 NOTE — Consult Note (Signed)
WOC Nurse Consult Note: patient had surgery by Dr. Michaell Cowing 09/12/2022 for debridement of unstageable PI R posterior gluteal; was discharged to [facility] and the physician there placed patient on NPWT Reason for Consult: change NPWT R posterior gluteal wound  Wound type: full thickness post debridement as above  Pressure Injury POA: NA  Measurement: R gluteal wound 5 cm x 6 cm x 2.5 cm with 3.5 cm undermining from 5-6 o'clock; has 4 adjacent surgical incisions l buttock full thickness 0.5 cm x 2 cm x 0.3 cm 100% pink moist; sacral area 0.5 cm x 2 cm x 0.8 cm 100% pink moist; R lateral buttock 0.5 cm x 1.5 cm x 0.5 cm 100% pink moist; R lower buttock 1 cm x 1 cm x 0.8 cm 100% pink and moist  Drainage (amount, consistency, odor) minimal serosanguinous  Periwound: intact  Dressing procedure/placement/frequency: Removed old NPWT dressing Cleansed wound with normal saline Periwound skin protected with skin barrier wipe or window framed with drape Skin protected to the R hip with VAC drape for foam bridge; was able to bridge sacral wound and R buttock wound to primary wound, filled L buttock wound and R lower buttock wound with silver hydrofiber [facility] [place] [phone]) that can be changed with NPWT dressing change on Friday.   Filled wound with  1 piece black foam to R gluteal wound, bridge to R hip, bridge to sacral incision and  R upper buttock incision which were both filled with a small piece of black foam Sealed NPWT dressing at HG  Patient received PO pain medication per bedside nurse prior to dressing change Patient tolerated procedure poorly, difficult for patient to lie flat due to respiratory issues; patient tearful at end of procedure in spite of PO pain medications   WOC nurse will continue to provide NPWT dressing changes due to the complexity of the dressing change; next dressing change Friday 11/03/2022.  2 medium vac kits at bedside.     Per family they have an appointment with wound care  center at [facility] 11/10/2022.   Thank you,    Priscella Mann MSN, RN-BC, [employer] [phone]

## 2022-10-31 NOTE — Progress Notes (Signed)
Heart Failure Navigator Progress Note  Assessed for Heart & Vascular TOC clinic readiness.  Patient does not meet criteria due EF is 60-65% and per Dr. Jomarie Longs.   Navigator will sign off at this time.   Olin Gurski,RN, BSN,MSN Heart Failure Nurse Navigator. Contact by secure chat only.

## 2022-11-01 DIAGNOSIS — I10 Essential (primary) hypertension: Secondary | ICD-10-CM | POA: Diagnosis not present

## 2022-11-01 DIAGNOSIS — J9601 Acute respiratory failure with hypoxia: Secondary | ICD-10-CM | POA: Diagnosis not present

## 2022-11-01 DIAGNOSIS — I5033 Acute on chronic diastolic (congestive) heart failure: Secondary | ICD-10-CM | POA: Diagnosis not present

## 2022-11-01 LAB — BASIC METABOLIC PANEL
Anion gap: 13 (ref 5–15)
BUN: 20 mg/dL (ref 8–23)
CO2: 34 mmol/L — ABNORMAL HIGH (ref 22–32)
Calcium: 8.3 mg/dL — ABNORMAL LOW (ref 8.9–10.3)
Chloride: 94 mmol/L — ABNORMAL LOW (ref 98–111)
Creatinine, Ser: 1.09 mg/dL — ABNORMAL HIGH (ref 0.44–1.00)
GFR, Estimated: 49 mL/min — ABNORMAL LOW (ref >60)
Glucose, Bld: 196 mg/dL — ABNORMAL HIGH (ref 70–99)
Potassium: 3.9 mmol/L (ref 3.5–5.1)
Sodium: 141 mmol/L (ref 135–145)

## 2022-11-01 LAB — GLUCOSE, CAPILLARY
Glucose-Capillary: 151 mg/dL — ABNORMAL HIGH (ref 70–99)
Glucose-Capillary: 230 mg/dL — ABNORMAL HIGH (ref 70–99)
Glucose-Capillary: 145 mg/dL — ABNORMAL HIGH (ref 70–99)
Glucose-Capillary: 194 mg/dL — ABNORMAL HIGH (ref 70–99)

## 2022-11-01 MED ORDER — ENOXAPARIN SODIUM 40 MG/0.4ML IJ SOSY
40.0000 mg | PREFILLED_SYRINGE | INTRAMUSCULAR | Status: DC
  Administered 2022-11-01 – 2022-11-03 (×3): 40 mg via SUBCUTANEOUS
  Filled 2022-11-01 (×3): qty 0.4

## 2022-11-01 MED ORDER — POTASSIUM CHLORIDE CRYS ER 20 MEQ PO TBCR
40.0000 meq | EXTENDED_RELEASE_TABLET | Freq: Once | ORAL | Status: AC
  Administered 2022-11-01: 40 meq via ORAL
  Filled 2022-11-01: qty 2

## 2022-11-01 MED ORDER — FUROSEMIDE 10 MG/ML IJ SOLN
80.0000 mg | INTRAMUSCULAR | Status: DC
  Administered 2022-11-01 – 2022-11-03 (×4): 80 mg via INTRAVENOUS
  Filled 2022-11-01 (×4): qty 8

## 2022-11-01 NOTE — Plan of Care (Signed)
  Problem: Education: Goal: Knowledge of General Education information will improve Description: Including pain rating scale, medication(s)/side effects and non-pharmacologic comfort measures Outcome: Progressing   Problem: Health Behavior/Discharge Planning: Goal: Ability to manage health-related needs will improve Outcome: Progressing   Problem: Clinical Measurements: Goal: Ability to maintain clinical measurements within normal limits will improve Outcome: Progressing Goal: Will remain free from infection Outcome: Progressing Goal: Diagnostic test results will improve Outcome: Progressing Goal: Respiratory complications will improve Outcome: Progressing Goal: Cardiovascular complication will be avoided Outcome: Progressing   Problem: Activity: Goal: Risk for activity intolerance will decrease Outcome: Progressing   Problem: Nutrition: Goal: Adequate nutrition will be maintained Outcome: Progressing   Problem: Coping: Goal: Level of anxiety will decrease Outcome: Progressing   Problem: Elimination: Goal: Will not experience complications related to bowel motility Outcome: Progressing Goal: Will not experience complications related to urinary retention Outcome: Progressing   Problem: Pain Managment: Goal: General experience of comfort will improve Outcome: Progressing   Problem: Safety: Goal: Ability to remain free from injury will improve Outcome: Progressing   Problem: Skin Integrity: Goal: Risk for impaired skin integrity will decrease Outcome: Progressing   Problem: Education: Goal: Ability to demonstrate management of disease process will improve Outcome: Progressing Goal: Ability to verbalize understanding of medication therapies will improve Outcome: Progressing Goal: Individualized Educational Video(s) Outcome: Progressing   Problem: Activity: Goal: Capacity to carry out activities will improve Outcome: Progressing   Problem: Cardiac: Goal:  Ability to achieve and maintain adequate cardiopulmonary perfusion will improve Outcome: Progressing   Problem: Activity: Goal: Ability to tolerate increased activity will improve Outcome: Progressing   Problem: Clinical Measurements: Goal: Ability to maintain a body temperature in the normal range will improve Outcome: Progressing   Problem: Respiratory: Goal: Ability to maintain adequate ventilation will improve Outcome: Progressing Goal: Ability to maintain a clear airway will improve Outcome: Progressing   

## 2022-11-01 NOTE — Plan of Care (Signed)

## 2022-11-01 NOTE — Progress Notes (Signed)
Occupational Therapy Treatment Patient Details Name: [name] [name] MRN: [id number] DOB: Jul 23, 1932 Today's Date: 11/01/2022   History of present illness Pt is 89 year old presented to [facility] on  10/28/22 for SOB and progressive edema. Pt with acute on chronic diastolic chf. Pt also with rt gluteal abscess with necrosis that has wound VAC in place.  PMH - chf, DM, CVA CKD, htn, obesity   OT comments  OT instructed pt in B UE therapeutic exercises with AROM and yellow theraputty with R UE and yellow theraband and yellow theraputty with L UE. OT also instructed pt in techniques for increased safety and independence with ADLs and bed mobility in preparation for functional tasks. Pt currently demonstrates ability to complete UB ADLs with Set up to Mod assist, LB ADLs with Max assist +2 for safety, supine to sit transfers with Min assist and increased time, and sit-stand transfers with Min-Mod assist with standing tolerance limited by pain in and difficulty bearing weight through R LE. Pt participated well and is making progress toward goals. Pt will benefit from continued acute skilled OT services. Post acute discharge, pt will benefit from continued skilled OT services in the home to maximize rehab potential.       If plan is discharge home, recommend the following:  Two people to help with walking and/or transfers;Two people to help with bathing/dressing/bathroom;Assistance with cooking/housework;Direct supervision/assist for medications management;Direct supervision/assist for financial management;Assist for transportation;Help with stairs or ramp for entrance;Assistance with feeding (Set up for self feeding)   Equipment Recommendations  None recommended by OT (Already has needed equipment)    Recommendations for Other Services      Precautions / Restrictions Precautions Precautions: Fall Restrictions Weight Bearing Restrictions: No       Mobility Bed Mobility Overal bed mobility: Needs  Assistance Bed Mobility: Supine to Sit     Supine to sit: Min assist, HOB elevated, Used rails (with increased time)     General bed mobility comments: Assist to bring R LE to EOB and R hip to square at EOB    Transfers Overall transfer level: Needs assistance     Sit to Stand: Min assist, From elevated surface, Mod assist           General transfer comment: Brief sit/stand transfer at EOB bed in simulated ADL tasks. Pt with difficulty bearing weight through R LE. Recommend +2 for safety during funcitonal tasks and for addressing standing tolerance.     Balance Overall balance assessment: Needs assistance Sitting-balance support: No upper extremity supported, Feet supported Sitting balance-Leahy Scale: Fair                                     ADL either performed or assessed with clinical judgement   ADL Overall ADL's : Needs assistance/impaired Eating/Feeding: Set up;Sitting   Grooming: Contact guard assist;Minimal assistance;Sitting   Upper Body Bathing: Moderate assistance;Sitting   Lower Body Bathing: Maximal assistance;Sit to/from stand;+2 for safety/equipment   Upper Body Dressing : Minimal assistance;Cueing for compensatory techniques;Sitting   Lower Body Dressing: Maximal assistance;Cueing for compensatory techniques;Sit to/from stand;+2 for safety/equipment                 General ADL Comments: Pt continues to present with decreased activity tolerance but demonstrates imporved overall activity tolerance as compared to last skilled OT session.    Extremity/Trunk Assessment Upper Extremity Assessment Upper Extremity Assessment: Generalized weakness;Right  hand dominant;RUE deficits/detail;LUE deficits/detail RUE Deficits / Details: generalized weakness Right > Left with history of CVA with residual Right sided weakness; decreased shoulder ROM; AROM shoulder flexion to approx. 50 degrees; PROM shoulder flexion to appox. 90 degrees; mildly  edematous hand and wrist RUE Sensation: decreased light touch;decreased proprioception RUE Coordination: decreased fine motor;decreased gross motor LUE Deficits / Details: generalized weakness worse on R   Lower Extremity Assessment Lower Extremity Assessment: Defer to PT evaluation        Vision       Perception     Praxis      Cognition Arousal: Alert Behavior During Therapy: WFL for tasks assessed/performed Overall Cognitive Status: Within Functional Limits for tasks assessed                                 General Comments: Mildly slow processing otherwise WFL.        Exercises Exercises: General Upper Extremity General Exercises - Upper Extremity Shoulder Flexion: AROM, Right, Theraband, Left, Strengthening, 10 reps, Seated (at EOB) Theraband Level (Shoulder Flexion): Level 1 (Yellow) Shoulder Extension: AROM, Right, Theraband, Left, Strengthening, 10 reps, Seated (at EOB) Theraband Level (Shoulder Extension): Level 1 (Yellow) Shoulder Horizontal ABduction: AAROM, Right, AROM, Theraband, Left, Strengthening, 10 reps, Seated (at EOB) Theraband Level (Shoulder Horizontal Abduction): Level 1 (Yellow) Shoulder Horizontal ADduction: AAROM, Right, AROM, Theraband, Left, Strengthening, 10 reps, Seated (at EOB) Theraband Level (Shoulder Horizontal Adduction): Level 1 (Yellow) Elbow Flexion: AROM, Right, Theraband, Left, Strengthening, 10 reps, Seated (at EOB) Theraband Level (Elbow Flexion): Level 1 (Yellow) Elbow Extension: AROM, Right, Theraband, Left, Strengthening, 10 reps, Seated (at EOB) Theraband Level (Elbow Extension): Level 1 (Yellow) Digit Composite Flexion: AROM, Strengthening, Other (comment), Both, 10 reps, Seated (theraputty - yellow; at EOB) Composite Extension: AROM, Strengthening, Both, Other (comment), 10 reps (Theraputty- yellow; at EOB)    Shoulder Instructions       General Comments VSS on RA throughout session. Pt's daughter present  at end of session. RN present during a portion of session.    Pertinent Vitals/ Pain       Pain Assessment Pain Assessment: Faces Faces Pain Scale: Hurts little more Pain Location: R knee Pain Descriptors / Indicators: Grimacing, Guarding Pain Intervention(s): Limited activity within patient's tolerance, Monitored during session, Repositioned  Home Living                                          Prior Functioning/Environment              Frequency  Min 1X/week        Progress Toward Goals  OT Goals(current goals can now be found in the care plan section)  Progress towards OT goals: Progressing toward goals  Acute Rehab OT Goals Patient Stated Goal: To be out of bed as much as possible and to continue getting stronger  Plan      Co-evaluation                 AM-PAC OT "6 Clicks" Daily Activity     Outcome Measure   Help from another person eating meals?: A Little Help from another person taking care of personal grooming?: A Little Help from another person toileting, which includes using toliet, bedpan, or urinal?: A Lot Help from another person bathing (including washing, rinsing,  drying)?: A Lot Help from another person to put on and taking off regular upper body clothing?: A Little Help from another person to put on and taking off regular lower body clothing?: A Lot 6 Click Score: 15    End of Session    OT Visit Diagnosis: Unsteadiness on feet (R26.81);Other abnormalities of gait and mobility (R26.89);Muscle weakness (generalized) (M62.81);Ataxia, unspecified (R27.0);Other (comment) (Decreased activity tolerance)   Activity Tolerance Patient tolerated treatment well   Patient Left in bed;with call bell/phone within reach;with family/visitor present;Other (comment) (sitting EOB)   Nurse Communication Mobility status;Other (comment) (Pt sitting EOB with daughter present.)        Time: 1610-9604 OT Time Calculation (min): 33  min  Charges: OT General Charges $OT Visit: 1 Visit OT Treatments $Self Care/Home Management : 8-22 mins $Therapeutic Exercise: 8-22 mins  Greig Altergott "Orson Eva., OTR/L, MA Acute Rehab [phone]   Lendon Colonel 11/01/2022, 3:36 PM

## 2022-11-01 NOTE — Care Management Important Message (Signed)
Important Message  Patient Details  Name: [name] [name] MRN: [id number] Date of Birth: [birth year]   Medicare Important Message Given:  Yes     [place], Stadtler 11/01/2022, 9:18 AM

## 2022-11-01 NOTE — Progress Notes (Addendum)
PROGRESS NOTE    GORGEOUS JANISSE  BJY:[id number] DOB: 1933-02-14 DOA: 10/28/2022 PCP: [name], MD  89/F with history of chronic diastolic CHF, type 2 diabetes mellitus, CVA, dyslipidemia, CKD 3B, obesity, hypertension was admitted 7/12-26 for right gluteal abscess with wet gangrene and necrosis complicated by coag negative staph bacteremia, underwent I&D and debridement per surgery, completed Unasyn 7/24 went to SNF for rehab with wound VAC.  While at SNF she was taken off Lasix on account of AKI, creatinine trended up to 3.5, subsequently discharged home from rehab 3 days ago, developed worsening shortness of breath and progressive edema In the ED tachypneic hypoxic, placed on BiPAP, labs with BNP 728, creatinine 1.05, hemoglobin 9.4,, chest x-ray with CHF and small pleural effusions -Slowly improving on diuretics, cards following  Subjective: -Feels better overall, down to 2 to 3 L O2  Assessment and Plan:  Acute on chronic diastolic CHF -Was significantly volume overloaded has 3+ edema extending to abdomen, thighs -Required BiPAP on admission -Recent echo 7/24 noted EF of 60-65% with grade 2 diastolic dysfunction and normal RV -Taken off diuretics 3 weeks ago at [facility] on account of AKI -Cards following, remains significantly volume overloaded, she is 7.2 L negative, weight down 11 LB  -Given albumin with IV Lasix for 2 days, continue IV Lasix increased dose, continue Aldactone -BMP in a.m., may need repeat metolazone this week   R gluteal abscess with necrosis -s/p I&D, debridement -Continue wound VAC, WOC consulted   Asthma -I suspect her wheezes are cardiac, remains significantly volume overloaded  > 20 pounds weight gain -continue DuoNebs regardless, no indication for steroids now   Stage 3b CKD -Appears to be stable at this time -Stable, avoid hypotension and nephrotoxic meds   HTN -Continue home meds - hydralazine, metoprolol -Diuretics   HLD -Continue Lipitor    DM -Last A1c was 9.8, poor control -Increase glargine, continue meal coverage   Morbid obesity -Increased morbidity/mortality risk -Weight loss should be encouraged -Outpatient PCP/bariatric medicine f/u encouraged      Advance Care Planning: DNR  DVT Prophylaxis: Lovenox Family Communication: Family at bedside Disposition Plan: Plan for home with home health services  Consultants:    Procedures:   Antimicrobials:    Objective: Vitals:   11/01/22 0415 11/01/22 0425 11/01/22 0810 11/01/22 0815  BP: (!) 150/38 (!) 148/46  (!) 161/57  Pulse: 70 70 72 77  Resp: 17  18 20   Temp: (!) 97.5 F (36.4 C)   99.1 F (37.3 C)  TempSrc: Oral   Oral  SpO2: 100% 100%  99%  Weight: 99.8 kg       Intake/Output Summary (Last 24 hours) at 11/01/2022 1127 Last data filed at 11/01/2022 0915 Gross per 24 hour  Intake 802.12 ml  Output 2231 ml  Net -1428.88 ml   Filed Weights   10/30/22 0458 10/31/22 0454 11/01/22 0415  Weight: 104.8 kg 104.3 kg 99.8 kg    Examination:  General exam: Morbidly obese chronically ill female sitting up in the recliner, AAOx3 HEENT: Positive JVD CVS: S1-S2, regular rhythm Lungs: Improving air movement, rare wheezes, basilar Rales Abdomen: Soft, nontender, bowel sounds present Extremities: 2+ edema upper thighs Sacrum, wound VAC noted  Psychiatry:  Mood & affect appropriate.     Data Reviewed:   CBC: Recent Labs  Lab 10/28/22 1147 10/29/22 0332 10/30/22 0813 10/31/22 0410  WBC 5.3 3.0* 9.1  --   HGB 9.4* 9.9* 11.2* 9.7*  HCT 31.5* 33.0* 37.1 31.7*  MCV 85.8 84.8 83.4  --   PLT 261 268 291  --    Basic Metabolic Panel: Recent Labs  Lab 10/28/22 1147 10/29/22 0332 10/30/22 0813 10/31/22 0410 11/01/22 0915  NA 139 141 143 142 141  K 4.2 4.4 3.8 3.6 3.9  CL 104 101 96* 96* 94*  CO2 26 26 29  34* 34*  GLUCOSE 190* 367* 169* 130* 196*  BUN 21 19 21 21 20   CREATININE 1.05* 1.25* 1.09* 1.26* 1.09*  CALCIUM 7.7* 8.1* 8.5* 8.4* 8.3*   MG  --   --   --  1.4*  --    GFR: Estimated Creatinine Clearance: 42.5 mL/min (A) (by C-G formula based on SCr of 1.09 mg/dL (H)). Liver Function Tests: Recent Labs  Lab 10/29/22 0332 10/30/22 0813  AST  --  27  ALT  --  16  ALKPHOS  --  70  BILITOT  --  0.6  PROT  --  6.4*  ALBUMIN 2.2* 2.5*   No results for input(s): "LIPASE", "AMYLASE" in the last 168 hours. No results for input(s): "AMMONIA" in the last 168 hours. Coagulation Profile: No results for input(s): "INR", "PROTIME" in the last 168 hours. Cardiac Enzymes: No results for input(s): "CKTOTAL", "CKMB", "CKMBINDEX", "TROPONINI" in the last 168 hours. BNP (last 3 results) No results for input(s): "PROBNP" in the last 8760 hours. HbA1C: No results for input(s): "HGBA1C" in the last 72 hours. CBG: Recent Labs  Lab 10/31/22 1135 10/31/22 1521 10/31/22 2125 11/01/22 0601 11/01/22 1109  GLUCAP 179* 169* 180* 151* 230*   Lipid Profile: No results for input(s): "CHOL", "HDL", "LDLCALC", "TRIG", "CHOLHDL", "LDLDIRECT" in the last 72 hours. Thyroid Function Tests: No results for input(s): "TSH", "T4TOTAL", "FREET4", "T3FREE", "THYROIDAB" in the last 72 hours. Anemia Panel: No results for input(s): "VITAMINB12", "FOLATE", "FERRITIN", "TIBC", "IRON", "RETICCTPCT" in the last 72 hours. Urine analysis:    Component Value Date/Time   COLORURINE YELLOW 09/13/2022 1652   APPEARANCEUR CLEAR 09/13/2022 1652   LABSPEC 1.015 09/13/2022 1652   PHURINE 5.0 09/13/2022 1652   GLUCOSEU NEGATIVE 09/13/2022 1652   HGBUR SMALL (A) 09/13/2022 1652   BILIRUBINUR NEGATIVE 09/13/2022 1652   KETONESUR NEGATIVE 09/13/2022 1652   PROTEINUR 30 (A) 09/13/2022 1652   UROBILINOGEN 0.2 07/23/2009 1015   NITRITE NEGATIVE 09/13/2022 1652   LEUKOCYTESUR NEGATIVE 09/13/2022 1652   Sepsis Labs: @LABRCNTIP (procalcitonin:4,lacticidven:4)  )No results found for this or any previous visit (from the past 240 hour(s)).   Radiology Studies: No  results found.   Scheduled Meds:  amantadine  100 mg Oral Daily   ascorbic acid  500 mg Oral Daily   aspirin EC  81 mg Oral Daily   atorvastatin  40 mg Oral Daily   bisacodyl  10 mg Rectal Once   diclofenac Sodium  2 g Topical TID   enoxaparin (LOVENOX) injection  40 mg Subcutaneous Q24H   feeding supplement (GLUCERNA SHAKE)  237 mL Oral TID BM   furosemide  80 mg Intravenous 2 times per day   guaiFENesin  600 mg Oral BID   hydrALAZINE  100 mg Oral TID   insulin aspart  0-15 Units Subcutaneous TID WC   insulin aspart  0-5 Units Subcutaneous QHS   insulin aspart  3 Units Subcutaneous TID WC   insulin glargine-yfgn  25 Units Subcutaneous BID   ipratropium-albuterol  3 mL Nebulization TID   lidocaine  2 patch Transdermal Q24H   metoprolol tartrate  100 mg Oral BID   multivitamin  with minerals  1 tablet Oral Daily   pantoprazole  40 mg Oral Daily   potassium chloride  40 mEq Oral Once   senna-docusate  1 tablet Oral BID   sodium chloride flush  3 mL Intravenous Q12H   spironolactone  25 mg Oral Daily   zinc sulfate  220 mg Oral Daily   Continuous Infusions:     LOS: 4 days    Time spent:    Zannie Cove, MD [facility]   11/01/2022, 11:27 AM

## 2022-11-01 NOTE — Progress Notes (Signed)
Progress Note  Patient Name: [name] [name] Date of Encounter: 11/01/2022  Primary Cardiologist: None   Subjective   Patient seen examined at her bedside. She was sitting in her chair with her family.  She offers no complaints at this time.  Inpatient Medications    Scheduled Meds:  amantadine  100 mg Oral Daily   ascorbic acid  500 mg Oral Daily   aspirin EC  81 mg Oral Daily   atorvastatin  40 mg Oral Daily   bisacodyl  10 mg Rectal Once   diclofenac Sodium  2 g Topical TID   enoxaparin (LOVENOX) injection  40 mg Subcutaneous Q24H   feeding supplement (GLUCERNA SHAKE)  237 mL Oral TID BM   furosemide  60 mg Intravenous 2 times per day   guaiFENesin  600 mg Oral BID   hydrALAZINE  100 mg Oral TID   insulin aspart  0-15 Units Subcutaneous TID WC   insulin aspart  0-5 Units Subcutaneous QHS   insulin aspart  3 Units Subcutaneous TID WC   insulin glargine-yfgn  25 Units Subcutaneous BID   ipratropium-albuterol  3 mL Nebulization TID   lidocaine  2 patch Transdermal Q24H   metoprolol tartrate  100 mg Oral BID   multivitamin with minerals  1 tablet Oral Daily   pantoprazole  40 mg Oral Daily   senna-docusate  1 tablet Oral BID   sodium chloride flush  3 mL Intravenous Q12H   spironolactone  25 mg Oral Daily   zinc sulfate  220 mg Oral Daily   Continuous Infusions:   PRN Meds: acetaminophen **OR** acetaminophen, albuterol, hydrALAZINE, methocarbamol, ondansetron **OR** ondansetron (ZOFRAN) IV, oxyCODONE, polyethylene glycol, QUEtiapine, traZODone   Vital Signs    Vitals:   11/01/22 0415 11/01/22 0425 11/01/22 0810 11/01/22 0815  BP: (!) 150/38 (!) 148/46  (!) 161/57  Pulse: 70 70 72 77  Resp: 17  18 20   Temp: (!) 97.5 F (36.4 C)   99.1 F (37.3 C)  TempSrc: Oral   Oral  SpO2: 100% 100%  99%  Weight: 99.8 kg       Intake/Output Summary (Last 24 hours) at 11/01/2022 1022 Last data filed at 11/01/2022 0915 Gross per 24 hour  Intake 802.12 ml  Output 2231 ml   Net -1428.88 ml   Filed Weights   10/30/22 0458 10/31/22 0454 11/01/22 0415  Weight: 104.8 kg 104.3 kg 99.8 kg    Telemetry     - Personally Reviewed  ECG     - Personally Reviewed  Physical Exam    General: Comfortable Head: Atraumatic, normal size  Eyes: PEERLA, EOMI  Neck: Supple, normal JVD Cardiac: Normal S1, S2; RRR; no murmurs, rubs, or gallops Lungs: Clear to auscultation bilaterally Abd: Soft, nontender, no hepatomegaly  Ext: warm,+2 lower extremity edema Musculoskeletal: No deformities, BUE and BLE strength normal and equal Skin: Warm and dry, no rashes   Neuro: Alert and oriented to person, place, time, and situation, CNII-XII grossly intact, no focal deficits  Psych: Normal mood and affect   Labs    Chemistry Recent Labs  Lab 10/29/22 0332 10/30/22 0813 10/31/22 0410 11/01/22 0915  NA 141 143 142 141  K 4.4 3.8 3.6 3.9  CL 101 96* 96* 94*  CO2 26 29 34* 34*  GLUCOSE 367* 169* 130* 196*  BUN 19 21 21 20   CREATININE 1.25* 1.09* 1.26* 1.09*  CALCIUM 8.1* 8.5* 8.4* 8.3*  PROT  --  6.4*  --   --  ALBUMIN 2.2* 2.5*  --   --   AST  --  27  --   --   ALT  --  16  --   --   ALKPHOS  --  70  --   --   BILITOT  --  0.6  --   --   GFRNONAA 41* 49* 41* 49*  ANIONGAP 14 18* 12 13     Hematology Recent Labs  Lab 10/28/22 1147 10/29/22 0332 10/30/22 0813 10/31/22 0410  WBC 5.3 3.0* 9.1  --   RBC 3.67* 3.89 4.45  --   HGB 9.4* 9.9* 11.2* 9.7*  HCT 31.5* 33.0* 37.1 31.7*  MCV 85.8 84.8 83.4  --   MCH 25.6* 25.4* 25.2*  --   MCHC 29.8* 30.0 30.2  --   RDW 20.9* 20.8* 21.2*  --   PLT 261 268 291  --     Cardiac EnzymesNo results for input(s): "TROPONINI" in the last 168 hours. No results for input(s): "TROPIPOC" in the last 168 hours.   BNP Recent Labs  Lab 10/28/22 1147  BNP 728.8*     DDimer No results for input(s): "DDIMER" in the last 168 hours.   Radiology    No results found.  Cardiac Studies   TTE 09/12/2022 IMPRESSIONS    1. Left ventricular ejection fraction, by estimation, is 60 to 65%. The left ventricle has normal function. The left ventricle has no regional wall motion abnormalities. There is mild concentric left ventricular  hypertrophy. Left ventricular diastolic parameters are consistent with Grade II diastolic dysfunction  (pseudonormalization).   2. Right ventricular systolic function is normal. The right ventricular size is normal. There is mildly elevated pulmonary artery systolic pressure. The estimated right ventricular systolic pressure is 39.8 mmHg.   3. Left atrial size was moderately dilated.   4. Right atrial size was mild to moderately dilated.   5. The mitral valve is degenerative. Mild mitral valve regurgitation. No vidence of mitral stenosis. Moderate mitral annular calcification.   6. The aortic valve is tricuspid. There is mild calcification of the  aortic valve. Aortic valve regurgitation is trivial. Aortic valve  sclerosis/calcification is present, without any evidence of aortic  stenosis.   7. The inferior vena cava is dilated in size with >50% respiratory  variability, suggesting right atrial pressure of 8 mmHg.   Conclusion(s)/Recommendation(s): No evidence of valvular vegetations on  this transthoracic echocardiogram. Consider a transesophageal  echocardiogram to exclude infective endocarditis if clinically indicated.   FINDINGS   Left Ventricle: Left ventricular ejection fraction, by estimation, is 60  to 65%. The left ventricle has normal function. The left ventricle has no  regional wall motion abnormalities. Definity contrast agent was given IV  to delineate the left ventricular   endocardial borders. The left ventricular internal cavity size was normal  in size. There is mild concentric left ventricular hypertrophy. Left  ventricular diastolic parameters are consistent with Grade II diastolic  dysfunction (pseudonormalization).   Right Ventricle: The right ventricular  size is normal. No increase in  right ventricular wall thickness. Right ventricular systolic function is  normal. There is mildly elevated pulmonary artery systolic pressure. The  tricuspid regurgitant velocity is 2.82   m/s, and with an assumed right atrial pressure of 8 mmHg, the estimated  right ventricular systolic pressure is 39.8 mmHg.   Left Atrium: Left atrial size was moderately dilated.   Right Atrium: Right atrial size was mild to moderately dilated.   Pericardium: There  is no evidence of pericardial effusion.   Mitral Valve: The mitral valve is degenerative in appearance. Moderate  mitral annular calcification. Mild mitral valve regurgitation. No evidence  of mitral valve stenosis. MV peak gradient, 9.7 mmHg. The mean mitral  valve gradient is 4.0 mmHg.   Tricuspid Valve: The tricuspid valve is normal in structure. Tricuspid  valve regurgitation is mild . No evidence of tricuspid stenosis.   Aortic Valve: The aortic valve is tricuspid. There is mild calcification  of the aortic valve. Aortic valve regurgitation is trivial. Aortic valve  sclerosis/calcification is present, without any evidence of aortic  stenosis.   Pulmonic Valve: The pulmonic valve was not well visualized. Pulmonic valve  regurgitation is trivial. No evidence of pulmonic stenosis.   Aorta: The aortic root is normal in size and structure.   Venous: The inferior vena cava is dilated in size with greater than 50%  respiratory variability, suggesting right atrial pressure of 8 mmHg.   IAS/Shunts: No atrial level shunt detected by color flow Doppler.    Patient Profile     89 y.o. female diabetes mellitus, CVA, hypertension, CKD stage IIIb has been admitted for acute on chronic diastolic heart failure  Assessment & Plan  Acute on chronic diastolic heart failure Hypoxic respiratory failure Hypertension Hyperlipidemia Diabetes mellitus  Clinically she appears to have significantly fluid overload,  she is currently on Lasix 60 mg twice a day we will continue this for now.  We transition to p.o. diuretics she would benefit from torsemide . kidney function stable.  She would benefit from increasing her antihypertensive. Her allergy to ACE inhibitor is only cough, therefore we can do a trial of an ARB.   For questions or updates, please contact [facility] Please consult [email or web address] for contact info under Cardiology/STEMI.      Signed, Almeter Westhoff, DO  11/01/2022, 10:22 AM

## 2022-11-02 DIAGNOSIS — I5033 Acute on chronic diastolic (congestive) heart failure: Secondary | ICD-10-CM | POA: Diagnosis not present

## 2022-11-02 LAB — CBC
HCT: 33 % — ABNORMAL LOW (ref 36.0–46.0)
Hemoglobin: 10.2 g/dL — ABNORMAL LOW (ref 12.0–15.0)
MCH: 25.3 pg — ABNORMAL LOW (ref 26.0–34.0)
MCHC: 30.9 g/dL (ref 30.0–36.0)
MCV: 81.9 fL (ref 80.0–100.0)
Platelets: 231 10*3/uL (ref 150–400)
RBC: 4.03 MIL/uL (ref 3.87–5.11)
RDW: 20.2 % — ABNORMAL HIGH (ref 11.5–15.5)
WBC: 4.9 10*3/uL (ref 4.0–10.5)
nRBC: 0 % (ref 0.0–0.2)

## 2022-11-02 LAB — BASIC METABOLIC PANEL
Anion gap: 12 (ref 5–15)
BUN: 21 mg/dL (ref 8–23)
CO2: 34 mmol/L — ABNORMAL HIGH (ref 22–32)
Calcium: 8.3 mg/dL — ABNORMAL LOW (ref 8.9–10.3)
Chloride: 94 mmol/L — ABNORMAL LOW (ref 98–111)
Creatinine, Ser: 1.22 mg/dL — ABNORMAL HIGH (ref 0.44–1.00)
GFR, Estimated: 43 mL/min — ABNORMAL LOW (ref >60)
Glucose, Bld: 138 mg/dL — ABNORMAL HIGH (ref 70–99)
Potassium: 4.1 mmol/L (ref 3.5–5.1)
Sodium: 140 mmol/L (ref 135–145)

## 2022-11-02 LAB — GLUCOSE, CAPILLARY
Glucose-Capillary: 112 mg/dL — ABNORMAL HIGH (ref 70–99)
Glucose-Capillary: 154 mg/dL — ABNORMAL HIGH (ref 70–99)
Glucose-Capillary: 132 mg/dL — ABNORMAL HIGH (ref 70–99)
Glucose-Capillary: 158 mg/dL — ABNORMAL HIGH (ref 70–99)

## 2022-11-02 MED ORDER — CARVEDILOL 25 MG PO TABS
25.0000 mg | ORAL_TABLET | Freq: Two times a day (BID) | ORAL | Status: DC
  Administered 2022-11-02 – 2022-11-03 (×2): 25 mg via ORAL
  Filled 2022-11-02 (×2): qty 1

## 2022-11-02 MED ORDER — GUAIFENESIN-DM 100-10 MG/5ML PO SYRP
5.0000 mL | ORAL_SOLUTION | ORAL | Status: DC | PRN
  Administered 2022-11-02 – 2022-11-03 (×2): 5 mL via ORAL
  Filled 2022-11-02 (×2): qty 5

## 2022-11-02 MED ORDER — MAGNESIUM SULFATE 4 GM/100ML IV SOLN
4.0000 g | Freq: Once | INTRAVENOUS | Status: AC
  Administered 2022-11-02: 4 g via INTRAVENOUS
  Filled 2022-11-02: qty 100

## 2022-11-02 MED ORDER — HYDROMORPHONE HCL 1 MG/ML IJ SOLN
0.5000 mg | INTRAMUSCULAR | Status: DC | PRN
  Administered 2022-11-02: 0.5 mg via INTRAVENOUS
  Filled 2022-11-02: qty 0.5

## 2022-11-02 NOTE — Progress Notes (Signed)
PROGRESS NOTE    [name] [name]  FAO:[id number] DOB: November 26, 1932 DOA: 10/28/2022 PCP: [name], MD   Brief Narrative:  This 89 yrs old Female with history of chronic diastolic CHF, type 2 diabetes mellitus, CVA, dyslipidemia, CKD 3B, obesity, hypertension was admitted from 7/12- 7/26 for right gluteal abscess with wet gangrene and necrosis complicated by coag negative staph bacteremia, underwent I&D and debridement per surgery, completed Unasyn  on 7/24 then she went to SNF for rehab with wound VAC.  While at SNF she was taken off Lasix on account of AKI, creatinine trended up to 3.5, subsequently discharged home from rehab 3 days ago, developed worsening shortness of breath and progressive edema. In the ED tachypneic, hypoxic, placed on BiPAP, labs with BNP 728, creatinine 1.05, hemoglobin 9.4, chest x-ray with CHF and small pleural effusions -Slowly improving on diuretics, cards following   Assessment & Plan:   Principal Problem:   Acute on chronic diastolic CHF (congestive heart failure) (HCC) Active Problems:   Dyslipidemia   Morbid obesity (HCC)   Uncontrolled type 2 diabetes mellitus with hyperglycemia (HCC)   Essential hypertension   Chronic kidney disease (CKD), stage III (moderate) (HCC)   Gluteal abscess   Left lower lobe pneumonia   Acute respiratory failure with hypoxia (HCC)   Acute on chronic diastolic CHF: -She was significantly volume overloaded,  has 3+ edema extending to abdomen, thighs. -She required BiPAP on admission. -Recent echo 7/24 noted LVEF of 60-65% with grade 2 diastolic dysfunction and normal RV -She was taken off diuretics 3 weeks ago at [facility] on account of AKI. -Cardiology following, remains significantly volume overloaded, She is 7.2 L negative, weight down 11 LB  -Given albumin with IV Lasix for 2 days, Continue IV Lasix increased dose, Continue Aldactone. -BMP in a.m., may need repeat metolazone this week. -Cardiology states continue IV Lasix  for 1 more day.   R gluteal abscess with necrosis: -s/p I&D, debridement. -Continue wound VAC, WOC consulted.   Asthma: - Suspect her wheezes are cardiac, remains significantly volume overloaded  > 20 pounds weight gain - Continue DuoNebs regardless, no indication for steroids now.   CKD Stage 3b: -Appears to be stable at this time. -Stable, avoid hypotension and nephrotoxic meds.   Essential HTN: -Continue home meds - hydralazine, metoprolol -Continue Diuretics   Hyperlipidemia: -Continue Lipitor   DM: -Last HbA1c was 9.8, poor control -Increase glargine, continue meal coverage.   Morbid obesity -Increased morbidity/mortality risk -Weight loss should be encouraged -Outpatient PCP/bariatric medicine f/u encouraged .   DVT prophylaxis:  Lovenox Code Status: Full code Family Communication: Family at bed side. Disposition Plan:   Status is: Inpatient Remains inpatient appropriate because: Still appears fluid overloaded.  Requiring IV Lasix  Consultants:  Cardiology  Procedures:   Antimicrobials:  Anti-infectives (From admission, onward)    Start     Dose/Rate Route Frequency Ordered Stop   10/28/22 1830  azithromycin (ZITHROMAX) 500 mg in sodium chloride 0.9 % 250 mL IVPB  Status:  Discontinued        500 mg 250 mL/hr over 60 Minutes Intravenous Every 24 hours 10/28/22 1610 10/29/22 1004   10/28/22 1800  cefTRIAXone (ROCEPHIN) 2 g in sodium chloride 0.9 % 100 mL IVPB  Status:  Discontinued        2 g 200 mL/hr over 30 Minutes Intravenous Every 24 hours 10/28/22 1610 10/29/22 1004      Subjective: Patient seen and examined at bedside.  Overnight events noted. She  still has significant pedal edema despite being getting Lasix. She denies any shortness of breath.  Renal functions are improving  Objective: Vitals:   11/02/22 0450 11/02/22 0726 11/02/22 0902 11/02/22 1110  BP: (!) 155/61 (!) 155/54  (!) 115/49  Pulse: 75 77  79  Resp: 16 18  18   Temp: 98.8 F  (37.1 C) (!) 97.4 F (36.3 C)    TempSrc: Oral Oral    SpO2: 96% 100% 98% 100%  Weight: 94.8 kg       Intake/Output Summary (Last 24 hours) at 11/02/2022 1419 Last data filed at 11/02/2022 1257 Gross per 24 hour  Intake 453 ml  Output 1180 ml  Net -727 ml   Filed Weights   10/31/22 0454 11/01/22 0415 11/02/22 0450  Weight: 104.3 kg 99.8 kg 94.8 kg    Examination:  General exam: Appears calm and comfortable, morbidly obese, deconditioned Respiratory system: Decreased breath sounds, crackles+. Respiratory effort normal.  RR 16 Cardiovascular system: S1 & S2 heard, RRR.  No murmur.  No pedal edema. Gastrointestinal system: Abdomen is nondistended, soft and nontender. Normal bowel sounds heard. Central nervous system: Alert and oriented x 3. No focal neurological deficits. Extremities: Edema++, no cyanosis, no clubbing  Skin: No rashes, lesions or ulcers Psychiatry: Judgement and insight appear normal. Mood & affect appropriate.     Data Reviewed: I have personally reviewed following labs and imaging studies  CBC: Recent Labs  Lab 10/28/22 1147 10/29/22 0332 10/30/22 0813 10/31/22 0410 11/02/22 0259  WBC 5.3 3.0* 9.1  --  4.9  HGB 9.4* 9.9* 11.2* 9.7* 10.2*  HCT 31.5* 33.0* 37.1 31.7* 33.0*  MCV 85.8 84.8 83.4  --  81.9  PLT 261 268 291  --  231   Basic Metabolic Panel: Recent Labs  Lab 10/29/22 0332 10/30/22 0813 10/31/22 0410 11/01/22 0915 11/02/22 0259  NA 141 143 142 141 140  K 4.4 3.8 3.6 3.9 4.1  CL 101 96* 96* 94* 94*  CO2 26 29 34* 34* 34*  GLUCOSE 367* 169* 130* 196* 138*  BUN 19 21 21 20 21   CREATININE 1.25* 1.09* 1.26* 1.09* 1.22*  CALCIUM 8.1* 8.5* 8.4* 8.3* 8.3*  MG  --   --  1.4*  --   --    GFR: Estimated Creatinine Clearance: 37 mL/min (A) (by C-G formula based on SCr of 1.22 mg/dL (H)). Liver Function Tests: Recent Labs  Lab 10/29/22 0332 10/30/22 0813  AST  --  27  ALT  --  16  ALKPHOS  --  70  BILITOT  --  0.6  PROT  --  6.4*   ALBUMIN 2.2* 2.5*   No results for input(s): "LIPASE", "AMYLASE" in the last 168 hours. No results for input(s): "AMMONIA" in the last 168 hours. Coagulation Profile: No results for input(s): "INR", "PROTIME" in the last 168 hours. Cardiac Enzymes: No results for input(s): "CKTOTAL", "CKMB", "CKMBINDEX", "TROPONINI" in the last 168 hours. BNP (last 3 results) No results for input(s): "PROBNP" in the last 8760 hours. HbA1C: No results for input(s): "HGBA1C" in the last 72 hours. CBG: Recent Labs  Lab 11/01/22 1109 11/01/22 1531 11/01/22 2109 11/02/22 0601 11/02/22 1127  GLUCAP 230* 145* 194* 112* 154*   Lipid Profile: No results for input(s): "CHOL", "HDL", "LDLCALC", "TRIG", "CHOLHDL", "LDLDIRECT" in the last 72 hours. Thyroid Function Tests: No results for input(s): "TSH", "T4TOTAL", "FREET4", "T3FREE", "THYROIDAB" in the last 72 hours. Anemia Panel: No results for input(s): "VITAMINB12", "FOLATE", "FERRITIN", "TIBC", "IRON", "RETICCTPCT"  in the last 72 hours. Sepsis Labs: No results for input(s): "PROCALCITON", "LATICACIDVEN" in the last 168 hours.  No results found for this or any previous visit (from the past 240 hour(s)).   Radiology Studies: No results found.  Scheduled Meds:  amantadine  100 mg Oral Daily   ascorbic acid  500 mg Oral Daily   aspirin EC  81 mg Oral Daily   atorvastatin  40 mg Oral Daily   bisacodyl  10 mg Rectal Once   diclofenac Sodium  2 g Topical TID   enoxaparin (LOVENOX) injection  40 mg Subcutaneous Q24H   feeding supplement (GLUCERNA SHAKE)  237 mL Oral TID BM   furosemide  80 mg Intravenous 2 times per day   guaiFENesin  600 mg Oral BID   hydrALAZINE  100 mg Oral TID   insulin aspart  0-15 Units Subcutaneous TID WC   insulin aspart  0-5 Units Subcutaneous QHS   insulin aspart  3 Units Subcutaneous TID WC   insulin glargine-yfgn  25 Units Subcutaneous BID   lidocaine  2 patch Transdermal Q24H   metoprolol tartrate  100 mg Oral BID    multivitamin with minerals  1 tablet Oral Daily   pantoprazole  40 mg Oral Daily   senna-docusate  1 tablet Oral BID   sodium chloride flush  3 mL Intravenous Q12H   spironolactone  25 mg Oral Daily   zinc sulfate  220 mg Oral Daily   Continuous Infusions:   LOS: 5 days    Time spent: 50 MINS    Willeen Niece, MD [facility]   If 7PM-7AM, please contact night-coverage

## 2022-11-02 NOTE — TOC Progression Note (Signed)
Transition of Care [facility]) - Progression Note    Patient Details  Name: [name] [name] MRN: [id number] Date of Birth: 03/06/1932  Transition of Care [facility]) CM/SW Contact  Lockie Pares, RN Phone Number: 11/02/2022, 8:34 AM  Clinical Narrative:    Patient is confirmed to be active with [facility] for PT OT RN. Spoke with Angie at [employer].   Expected Discharge Plan: Home w Home Health Services Barriers to Discharge: Continued Medical Work up  Expected Discharge Plan and Services In-house Referral: NA Discharge Planning Services: CM Consult Post Acute Care Choice: Home Health Living arrangements for the past 2 months: Single Family Home                 DME Arranged: N/A DME Agency: NA       HH Arranged: PT, OT           Social Determinants of Health (SDOH) Interventions SDOH Screenings   Food Insecurity: No Food Insecurity (09/08/2022)  Housing: Low Risk  (09/08/2022)  Transportation Needs: No Transportation Needs (09/08/2022)  Utilities: Not At Risk (09/08/2022)  Depression (PHQ2-9): Low Risk  (03/27/2022)  Financial Resource Strain: Low Risk  (06/14/2022)   Received from [facility], [facility]  Physical Activity: Inactive (05/19/2019)   Received from [facility], [facility]  Social Connections: Socially Integrated (12/05/2021)   Received from [facility], [facility]  Stress: No Stress Concern Present (12/05/2021)   Received from [facility], [facility]  Tobacco Use: Low Risk  (10/28/2022)    Readmission Risk Interventions    10/31/2022    6:04 PM  Readmission Risk Prevention Plan  Transportation Screening Complete  Medication Review (RN Care Manager) Complete  HRI or Home Care Consult Complete

## 2022-11-02 NOTE — Progress Notes (Signed)
Physical Therapy Treatment Patient Details Name: [name] [name] MRN: [id number] DOB: Dec 10, 1932 Today's Date: 11/02/2022   History of Present Illness Pt is 89 year old presented to [facility] on  10/28/22 for SOB and progressive edema. Pt with acute on chronic diastolic chf. Pt also with rt gluteal abscess with necrosis that has wound VAC in place.  PMH - chf, DM, CVA CKD, htn, obesity    PT Comments  Pt reports decreased pain in R knee today and was agreeable to PT session. Pt continues to need MinA to CGA for sit/stand transfers with stedy. Pt fatigues quickly and needs frequent rest breaks. Pt is able to bear weight on B LE with weight shifts, but was unable to lift LE. Pt continues to progress well towards goals. Acute PT to follow.     If plan is discharge home, recommend the following: Two people to help with bathing/dressing/bathroom;Assistance with cooking/housework;Assist for transportation;Help with stairs or ramp for entrance;A lot of help with walking and/or transfers           Precautions / Restrictions Precautions Precautions: Fall Restrictions Weight Bearing Restrictions: No     Mobility  Bed Mobility Overal bed mobility:  (pt in chair upon arrival, returned to chair)                  Transfers Overall transfer level: Needs assistance Equipment used: Ambulation equipment used Transfers: Sit to/from Stand (x4) Sit to Stand: Min assist (x2 MinA, x2 CGA)           General transfer comment: Pt uses L UE to bring R UE to stedy bar, decreased speed throughout stand, needs MinA on 2/4 trials for initial rise Transfer via Lift Equipment: Stedy  Ambulation/Gait             Pre-gait activities: B weight-shifts in stedy x2 trials, x5 B per trial       Balance Overall balance assessment: Needs assistance Sitting-balance support: Feet supported, No upper extremity supported Sitting balance-Leahy Scale: Fair Sitting balance - Comments: sitting in recliner    Standing balance support: Bilateral upper extremity supported, During functional activity, Reliant on assistive device for balance Standing balance-Leahy Scale: Poor Standing balance comment: with stedy support and CGA for safety         Cognition Arousal: Alert Behavior During Therapy: WFL for tasks assessed/performed Overall Cognitive Status: Within Functional Limits for tasks assessed             Exercises General Exercises - Lower Extremity Ankle Circles/Pumps: AROM, Both, Seated, 10 reps Long Arc Quad: AROM, Seated, Both, 10 reps Hip Flexion/Marching: AROM, Seated, Both, 10 reps    General Comments General comments (skin integrity, edema, etc.): VSS on 2L [place] throughout session. SpO2 96-98      Pertinent Vitals/Pain Pain Assessment Pain Assessment: Faces Faces Pain Scale: Hurts a little bit Pain Location: R knee Pain Descriptors / Indicators: Grimacing, Guarding Pain Intervention(s): Monitored during session, Limited activity within patient's tolerance, Repositioned    Home Living Family/patient expects to be discharged to:: Private residence Living Arrangements: Children            Prior Function        PT Goals (current goals can now be found in the care plan section) Acute Rehab PT Goals Patient Stated Goal: go home PT Goal Formulation: With patient/family Time For Goal Achievement: 11/12/22 Potential to Achieve Goals: Fair    Frequency    Min 1X/week  AM-PAC PT "6 Clicks" Mobility   Outcome Measure  Help needed turning from your back to your side while in a flat bed without using bedrails?: None Help needed moving from lying on your back to sitting on the side of a flat bed without using bedrails?: A Little Help needed moving to and from a bed to a chair (including a wheelchair)?: A Little Help needed standing up from a chair using your arms (e.g., wheelchair or bedside chair)?: A Little Help needed to walk in hospital room?:  Total Help needed climbing 3-5 steps with a railing? : Total 6 Click Score: 15    End of Session Equipment Utilized During Treatment: Gait belt;Oxygen (2L [place]) Activity Tolerance: Patient limited by fatigue;Patient tolerated treatment well Patient left: in chair;with family/visitor present;with call bell/phone within reach (no chair alarm upon entry, family present to ensure pt does not get up without staff present) Nurse Communication: Mobility status PT Visit Diagnosis: Other abnormalities of gait and mobility (R26.89);Muscle weakness (generalized) (M62.81);Difficulty in walking, not elsewhere classified (R26.2)     Time: 1030-1100 PT Time Calculation (min) (ACUTE ONLY): 30 min  Charges:    $Therapeutic Exercise: 8-22 mins $Therapeutic Activity: 8-22 mins PT General Charges $$ ACUTE PT VISIT: 1 Visit                     Hilton Cork, PT, DPT Secure Chat Preferred  Rehab Office [phone]    Arturo Morton Brion Aliment 11/02/2022, 2:21 PM

## 2022-11-02 NOTE — Plan of Care (Signed)
  Problem: Education: Goal: Knowledge of General Education information will improve Description: Including pain rating scale, medication(s)/side effects and non-pharmacologic comfort measures Outcome: Progressing   Problem: Health Behavior/Discharge Planning: Goal: Ability to manage health-related needs will improve Outcome: Progressing   Problem: Clinical Measurements: Goal: Ability to maintain clinical measurements within normal limits will improve Outcome: Progressing Goal: Will remain free from infection Outcome: Progressing Goal: Diagnostic test results will improve Outcome: Progressing Goal: Respiratory complications will improve Outcome: Progressing Goal: Cardiovascular complication will be avoided Outcome: Progressing   Problem: Activity: Goal: Risk for activity intolerance will decrease Outcome: Progressing   Problem: Nutrition: Goal: Adequate nutrition will be maintained Outcome: Progressing   Problem: Coping: Goal: Level of anxiety will decrease Outcome: Progressing   Problem: Elimination: Goal: Will not experience complications related to bowel motility Outcome: Progressing Goal: Will not experience complications related to urinary retention Outcome: Progressing   Problem: Pain Managment: Goal: General experience of comfort will improve Outcome: Progressing   Problem: Safety: Goal: Ability to remain free from injury will improve Outcome: Progressing   Problem: Skin Integrity: Goal: Risk for impaired skin integrity will decrease Outcome: Progressing   Problem: Education: Goal: Ability to demonstrate management of disease process will improve Outcome: Progressing Goal: Ability to verbalize understanding of medication therapies will improve Outcome: Progressing Goal: Individualized Educational Video(s) Outcome: Progressing   Problem: Activity: Goal: Capacity to carry out activities will improve Outcome: Progressing   Problem: Cardiac: Goal:  Ability to achieve and maintain adequate cardiopulmonary perfusion will improve Outcome: Progressing   Problem: Activity: Goal: Ability to tolerate increased activity will improve Outcome: Progressing   Problem: Clinical Measurements: Goal: Ability to maintain a body temperature in the normal range will improve Outcome: Progressing   Problem: Respiratory: Goal: Ability to maintain adequate ventilation will improve Outcome: Progressing Goal: Ability to maintain a clear airway will improve Outcome: Progressing   

## 2022-11-02 NOTE — Progress Notes (Addendum)
Rounding Note    Patient Name: [name] [name] Date of Encounter: 11/02/2022  [facility] Cardiologist: None   Subjective   No acute overnight events. She feeling like she is breathing better but is till on 2L of O2 via nasal cannula. She continues to have lower extremity edema but it is much better than before. No chest pain.  Inpatient Medications    Scheduled Meds:  amantadine  100 mg Oral Daily   ascorbic acid  500 mg Oral Daily   aspirin EC  81 mg Oral Daily   atorvastatin  40 mg Oral Daily   bisacodyl  10 mg Rectal Once   diclofenac Sodium  2 g Topical TID   enoxaparin (LOVENOX) injection  40 mg Subcutaneous Q24H   feeding supplement (GLUCERNA SHAKE)  237 mL Oral TID BM   furosemide  80 mg Intravenous 2 times per day   guaiFENesin  600 mg Oral BID   hydrALAZINE  100 mg Oral TID   insulin aspart  0-15 Units Subcutaneous TID WC   insulin aspart  0-5 Units Subcutaneous QHS   insulin aspart  3 Units Subcutaneous TID WC   insulin glargine-yfgn  25 Units Subcutaneous BID   lidocaine  2 patch Transdermal Q24H   metoprolol tartrate  100 mg Oral BID   multivitamin with minerals  1 tablet Oral Daily   pantoprazole  40 mg Oral Daily   senna-docusate  1 tablet Oral BID   sodium chloride flush  3 mL Intravenous Q12H   spironolactone  25 mg Oral Daily   zinc sulfate  220 mg Oral Daily   Continuous Infusions:  PRN Meds: acetaminophen **OR** acetaminophen, albuterol, hydrALAZINE, HYDROmorphone (DILAUDID) injection, methocarbamol, ondansetron **OR** ondansetron (ZOFRAN) IV, oxyCODONE, polyethylene glycol, QUEtiapine, traZODone   Vital Signs    Vitals:   11/02/22 0036 11/02/22 0450 11/02/22 0726 11/02/22 0902  BP: (!) 135/50 (!) 155/61 (!) 155/54   Pulse: 73 75 77   Resp: 18 16 18    Temp: (!) 97.5 F (36.4 C) 98.8 F (37.1 C) (!) 97.4 F (36.3 C)   TempSrc: Oral Oral Oral   SpO2: 99% 96% 100% 98%  Weight:  94.8 kg      Intake/Output Summary (Last 24  hours) at 11/02/2022 0915 Last data filed at 11/02/2022 4098 Gross per 24 hour  Intake 423 ml  Output 2580 ml  Net -2157 ml      11/02/2022    4:50 AM 11/01/2022    4:15 AM 10/31/2022    4:54 AM  Last 3 Weights  Weight (lbs) 209 lb 220 lb 230 lb  Weight (kg) 94.802 kg 99.791 kg 104.327 kg      Telemetry    Normal sinus rhythm with rates in the 70s to 80s. One short run of PAT. - Personally Reviewed  ECG    No new ECG tracing today. - Personally Reviewed  Physical Exam   GEN: Obese Caucasian female resting comfortably in no acute distress.  Currently on 2L of O2 via nasal cannula. Neck: No JVD. Cardiac: RRR. No murmurs, rubs, or gallops.  Respiratory: No increased work of breathing. Diminished breaths sounds in bilateral bases with rhonchi. No significant crackles. GI: Soft, non-distended, and non-tender. MS: 2+ pitting edema of bilateral lower extremities (right > left). No deformity. Skin: Warm and dry. Neuro:  No focal deficits. Psych: Normal affect. Responds appropriately.   Labs    High Sensitivity Troponin:   Recent Labs  Lab 10/28/22 1148  10/28/22 1337  TROPONINIHS 24* 23*     Chemistry Recent Labs  Lab 10/29/22 0332 10/30/22 0813 10/31/22 0410 11/01/22 0915 11/02/22 0259  NA 141 143 142 141 140  K 4.4 3.8 3.6 3.9 4.1  CL 101 96* 96* 94* 94*  CO2 26 29 34* 34* 34*  GLUCOSE 367* 169* 130* 196* 138*  BUN 19 21 21 20 21   CREATININE 1.25* 1.09* 1.26* 1.09* 1.22*  CALCIUM 8.1* 8.5* 8.4* 8.3* 8.3*  MG  --   --  1.4*  --   --   PROT  --  6.4*  --   --   --   ALBUMIN 2.2* 2.5*  --   --   --   AST  --  27  --   --   --   ALT  --  16  --   --   --   ALKPHOS  --  70  --   --   --   BILITOT  --  0.6  --   --   --   GFRNONAA 41* 49* 41* 49* 43*  ANIONGAP 14 18* 12 13 12     Lipids No results for input(s): "CHOL", "TRIG", "HDL", "LABVLDL", "LDLCALC", "CHOLHDL" in the last 168 hours.  Hematology Recent Labs  Lab 10/29/22 0332 10/30/22 0813 10/31/22 0410  11/02/22 0259  WBC 3.0* 9.1  --  4.9  RBC 3.89 4.45  --  4.03  HGB 9.9* 11.2* 9.7* 10.2*  HCT 33.0* 37.1 31.7* 33.0*  MCV 84.8 83.4  --  81.9  MCH 25.4* 25.2*  --  25.3*  MCHC 30.0 30.2  --  30.9  RDW 20.8* 21.2*  --  20.2*  PLT 268 291  --  231   Thyroid No results for input(s): "TSH", "FREET4" in the last 168 hours.  BNP Recent Labs  Lab 10/28/22 1147  BNP 728.8*    DDimer No results for input(s): "DDIMER" in the last 168 hours.   Radiology    No results found.  Cardiac Studies   Echocardiogram 09/12/2022: Impressions: 1. Left ventricular ejection fraction, by estimation, is 60 to 65%. The  left ventricle has normal function. The left ventricle has no regional  wall motion abnormalities. There is mild concentric left ventricular  hypertrophy. Left ventricular diastolic  parameters are consistent with Grade II diastolic dysfunction  (pseudonormalization).   2. Right ventricular systolic function is normal. The right ventricular  size is normal. There is mildly elevated pulmonary artery systolic  pressure. The estimated right ventricular systolic pressure is 39.8 mmHg.   3. Left atrial size was moderately dilated.   4. Right atrial size was mild to moderately dilated.   5. The mitral valve is degenerative. Mild mitral valve regurgitation. No  evidence of mitral stenosis. Moderate mitral annular calcification.   6. The aortic valve is tricuspid. There is mild calcification of the  aortic valve. Aortic valve regurgitation is trivial. Aortic valve  sclerosis/calcification is present, without any evidence of aortic  stenosis.   7. The inferior vena cava is dilated in size with >50% respiratory  variability, suggesting right atrial pressure of 8 mmHg.   Conclusion(s)/Recommendation(s): No evidence of valvular vegetations on  this transthoracic echocardiogram. Consider a transesophageal  echocardiogram to exclude infective endocarditis if clinically indicated.     Patient Profile     89 y.o. female with a history of chronic diastolic CHF, hypertension, hyperlipidemia, type 2 diabetes mellitus, CKD stage III, CVA, recent right gluteal abscess in  08/2022, and obesity. She was recently admitted in 08/2022 for a right gluteal abscess with wet gangrene and necrosis with MRSA coag negative Staph bacteremia which was treated with I&D and extensive debridement, antibiotics, and hydrotherapy. Hospitalization was complicated by AKI with creatinine peaking at 3.57 and home Lasix was stopped. She was discharged to [facility] for rehab. She presented back to [facility] ED on 10/28/2022 for further evaluation of shortness of breath and progressive lower extremity edema after only being home from SNF for 3 days. She was admitted for acute on chronic CHF and Cardiology consulted for assistance.  Assessment & Plan    Acute on Chronic Diastolic CHF Patient presented with progressive shortness of breath and lower extremity edema. BNP elevated at 728. Chest x-ray showed mild cardiomegaly with small bilateral pleural effusions and mild interstitial edema consistent with CHF. Echo in 08/2022 showed LVEF of 60-65% with mild LVH and grade 2 diastolic dysfunction, normal RV size and function, mild MR, and mildly elevated PASP of 39. . She has been diuresed with IV Lasix which was increased to 80mg  twice daily yesterday. Documented urinary output of 2.58 L yesterday and net negative 9.285 L this admission. Weight is 209 lbs but suspect this may not be accurate (down from 220 lbs yesterday and 230 lbs on 9/4).  Renal function stable. - Still looks volume overloaded. - Continue IV Lasix 80mg  twice daily for now. - Continue Spironolactone 25mg  daily. - Will hold off on SGLT2 inhibitor given recent gluteal abscess.  - Continue to monitor daily weights, strict I/Os, and renal function.  Hypertension BP elevated. Systolic BP mostly in the 150s to 160s. - Continue Spironolactone  25mg  daily. - Continue Hydralazine 100mg  three times daily. - Will start Coreg 3.125mg  twice daily.  Hyperlipidemia - Continue Lipitor 40mg  daily.  CKD Stage III Creatinine 1.05 on admission. Baseline around 1.2 to 1.3. - Creatinine slightly up from 1.0 yesterday to 1.22 today. - Continue to monitor closely with diuresis.  Otherwise, per primary team: - Right gluteal abscess with necrosis - Type 2 diabetes mellitus - Asthma - Obesity  For questions or updates, please contact [facility] Please consult [email or web address] for contact info under     Signed, Corrin Parker, PA-C  11/02/2022, 9:15 AM    Patient seen and examined, note reviewed with the signed Advanced Practice Provider. I personally reviewed laboratory data, imaging studies and relevant notes. I independently examined the patient and formulated the important aspects of the plan. I have personally discussed the plan with the patient and/or family. Comments or changes to the note/plan are indicated below.  Acute on chronic diastolic heart failure  Hypertension  Hyperlipidemia  CKD Stage III   Continue IV Lasix today, still is clinically fluid overloaded.  She is also hypertensive,stop lopressor and start coreg 25 mg BID   Thomasene Ripple DO, MS [facility] Attending Cardiologist [facility]  [address] [place], [place] [address] [phone] Website: [email or web address]

## 2022-11-03 DIAGNOSIS — I5033 Acute on chronic diastolic (congestive) heart failure: Secondary | ICD-10-CM | POA: Diagnosis not present

## 2022-11-03 LAB — CBC
HCT: 32.9 % — ABNORMAL LOW (ref 36.0–46.0)
Hemoglobin: 10.1 g/dL — ABNORMAL LOW (ref 12.0–15.0)
MCH: 24.7 pg — ABNORMAL LOW (ref 26.0–34.0)
MCHC: 30.7 g/dL (ref 30.0–36.0)
MCV: 80.4 fL (ref 80.0–100.0)
Platelets: 283 10*3/uL (ref 150–400)
RBC: 4.09 MIL/uL (ref 3.87–5.11)
RDW: 20.2 % — ABNORMAL HIGH (ref 11.5–15.5)
WBC: 5.8 10*3/uL (ref 4.0–10.5)
nRBC: 0 % (ref 0.0–0.2)

## 2022-11-03 LAB — GLUCOSE, CAPILLARY
Glucose-Capillary: 140 mg/dL — ABNORMAL HIGH (ref 70–99)
Glucose-Capillary: 198 mg/dL — ABNORMAL HIGH (ref 70–99)
Glucose-Capillary: 159 mg/dL — ABNORMAL HIGH (ref 70–99)
Glucose-Capillary: 164 mg/dL — ABNORMAL HIGH (ref 70–99)

## 2022-11-03 LAB — BASIC METABOLIC PANEL
Anion gap: 17 — ABNORMAL HIGH (ref 5–15)
BUN: 22 mg/dL (ref 8–23)
CO2: 32 mmol/L (ref 22–32)
Calcium: 8.2 mg/dL — ABNORMAL LOW (ref 8.9–10.3)
Chloride: 90 mmol/L — ABNORMAL LOW (ref 98–111)
Creatinine, Ser: 1.4 mg/dL — ABNORMAL HIGH (ref 0.44–1.00)
GFR, Estimated: 36 mL/min — ABNORMAL LOW (ref >60)
Glucose, Bld: 146 mg/dL — ABNORMAL HIGH (ref 70–99)
Potassium: 3.5 mmol/L (ref 3.5–5.1)
Sodium: 139 mmol/L (ref 135–145)

## 2022-11-03 LAB — MAGNESIUM: Magnesium: 2.4 mg/dL (ref 1.7–2.4)

## 2022-11-03 LAB — PHOSPHORUS: Phosphorus: 4.4 mg/dL (ref 2.5–4.6)

## 2022-11-03 LAB — BRAIN NATRIURETIC PEPTIDE: B Natriuretic Peptide: 440.7 pg/mL — ABNORMAL HIGH (ref 0.0–100.0)

## 2022-11-03 MED ORDER — CARVEDILOL 12.5 MG PO TABS
12.5000 mg | ORAL_TABLET | Freq: Two times a day (BID) | ORAL | Status: DC
  Administered 2022-11-03 – 2022-11-04 (×2): 12.5 mg via ORAL
  Filled 2022-11-03 (×2): qty 1

## 2022-11-03 MED ORDER — POTASSIUM CHLORIDE CRYS ER 20 MEQ PO TBCR
40.0000 meq | EXTENDED_RELEASE_TABLET | Freq: Once | ORAL | Status: AC
  Administered 2022-11-03: 40 meq via ORAL
  Filled 2022-11-03: qty 2

## 2022-11-03 MED ORDER — TORSEMIDE 20 MG PO TABS
20.0000 mg | ORAL_TABLET | Freq: Every day | ORAL | Status: DC
  Administered 2022-11-04: 20 mg via ORAL
  Filled 2022-11-03: qty 1

## 2022-11-03 MED ORDER — METHOCARBAMOL 500 MG PO TABS
250.0000 mg | ORAL_TABLET | Freq: Three times a day (TID) | ORAL | Status: DC | PRN
  Administered 2022-11-03 – 2022-11-04 (×3): 250 mg via ORAL
  Filled 2022-11-03 (×2): qty 1

## 2022-11-03 NOTE — Progress Notes (Addendum)
Rounding Note    Patient Name: [name] [name] Date of Encounter: 11/03/2022  [facility] Cardiologist: None   Subjective   Patient's BP dropped last night to as low as 85/57. Most recent BP 112/59 which is much lower than it was throughout the day yesterday. She is feeling very weak this morning and is more somnolent. She also reports feeling a little lightheaded. She denies any chest pain or significant shortness of breath. She is still on 2L of O2 because she feels like she breaths better with this. O2 sats in the high 90s.   Inpatient Medications    Scheduled Meds:  amantadine  100 mg Oral Daily   ascorbic acid  500 mg Oral Daily   aspirin EC  81 mg Oral Daily   atorvastatin  40 mg Oral Daily   bisacodyl  10 mg Rectal Once   carvedilol  25 mg Oral BID WC   diclofenac Sodium  2 g Topical TID   enoxaparin (LOVENOX) injection  40 mg Subcutaneous Q24H   feeding supplement (GLUCERNA SHAKE)  237 mL Oral TID BM   furosemide  80 mg Intravenous 2 times per day   guaiFENesin  600 mg Oral BID   hydrALAZINE  100 mg Oral TID   insulin aspart  0-15 Units Subcutaneous TID WC   insulin aspart  0-5 Units Subcutaneous QHS   insulin aspart  3 Units Subcutaneous TID WC   insulin glargine-yfgn  25 Units Subcutaneous BID   lidocaine  2 patch Transdermal Q24H   multivitamin with minerals  1 tablet Oral Daily   pantoprazole  40 mg Oral Daily   senna-docusate  1 tablet Oral BID   sodium chloride flush  3 mL Intravenous Q12H   spironolactone  25 mg Oral Daily   zinc sulfate  220 mg Oral Daily   Continuous Infusions:  PRN Meds: acetaminophen **OR** acetaminophen, albuterol, guaiFENesin-dextromethorphan, hydrALAZINE, HYDROmorphone (DILAUDID) injection, methocarbamol, ondansetron **OR** ondansetron (ZOFRAN) IV, oxyCODONE, polyethylene glycol, QUEtiapine, traZODone   Vital Signs    Vitals:   11/02/22 2132 11/03/22 0014 11/03/22 0433 11/03/22 0755  BP: (!) 114/44 (!) 141/45 (!)  145/42 (!) 112/59  Pulse:  74 75 81  Resp:  17 18 19   Temp:  98 F (36.7 C) 97.7 F (36.5 C) 98.1 F (36.7 C)  TempSrc:  Oral Oral Oral  SpO2:  100% 98% 93%  Weight:   90.7 kg     Intake/Output Summary (Last 24 hours) at 11/03/2022 0917 Last data filed at 11/03/2022 0612 Gross per 24 hour  Intake 530 ml  Output 1190 ml  Net -660 ml      11/03/2022    4:33 AM 11/02/2022    4:50 AM 11/01/2022    4:15 AM  Last 3 Weights  Weight (lbs) 200 lb 209 lb 220 lb  Weight (kg) 90.719 kg 94.802 kg 99.791 kg      Telemetry    Normal sinus rhythm with rates in the 70s. - Personally Reviewed  ECG    No new ECG tracing today. - Personally Reviewed  Physical Exam   GEN: Obese Caucasian female resting comfortably in no acute distress.  Currently on 2L of O2 via nasal cannula. Neck: No JVD. Cardiac: RRR. No murmurs, rubs, or gallops.  Respiratory: No increased work of breathing. Diminished breaths sounds in bilateral bases with rhonchi/ wheezing No significant crackles. GI: Soft, non-distended, and non-tender. MS: 2+ pitting edema of bilateral lower extremities (right chronically > left).  No deformity. Skin: Warm and dry. Neuro:  No focal deficits. More somnolent today. Psych: Normal affect. Responds appropriately.   Labs    High Sensitivity Troponin:   Recent Labs  Lab 10/28/22 1148 10/28/22 1337  TROPONINIHS 24* 23*     Chemistry Recent Labs  Lab 10/29/22 0332 10/30/22 0813 10/31/22 0410 11/01/22 0915 11/02/22 0259 11/03/22 0332  NA 141 143 142 141 140 139  K 4.4 3.8 3.6 3.9 4.1 3.5  CL 101 96* 96* 94* 94* 90*  CO2 26 29 34* 34* 34* 32  GLUCOSE 367* 169* 130* 196* 138* 146*  BUN 19 21 21 20 21 22   CREATININE 1.25* 1.09* 1.26* 1.09* 1.22* 1.40*  CALCIUM 8.1* 8.5* 8.4* 8.3* 8.3* 8.2*  MG  --   --  1.4*  --   --  2.4  PROT  --  6.4*  --   --   --   --   ALBUMIN 2.2* 2.5*  --   --   --   --   AST  --  27  --   --   --   --   ALT  --  16  --   --   --   --   ALKPHOS  --   70  --   --   --   --   BILITOT  --  0.6  --   --   --   --   GFRNONAA 41* 49* 41* 49* 43* 36*  ANIONGAP 14 18* 12 13 12  17*    Lipids No results for input(s): "CHOL", "TRIG", "HDL", "LABVLDL", "LDLCALC", "CHOLHDL" in the last 168 hours.  Hematology Recent Labs  Lab 10/30/22 0813 10/31/22 0410 11/02/22 0259 11/03/22 0332  WBC 9.1  --  4.9 5.8  RBC 4.45  --  4.03 4.09  HGB 11.2* 9.7* 10.2* 10.1*  HCT 37.1 31.7* 33.0* 32.9*  MCV 83.4  --  81.9 80.4  MCH 25.2*  --  25.3* 24.7*  MCHC 30.2  --  30.9 30.7  RDW 21.2*  --  20.2* 20.2*  PLT 291  --  231 283   Thyroid No results for input(s): "TSH", "FREET4" in the last 168 hours.  BNP Recent Labs  Lab 10/28/22 1147  BNP 728.8*    DDimer No results for input(s): "DDIMER" in the last 168 hours.   Radiology    No results found.  Cardiac Studies   Echocardiogram 09/12/2022: Impressions: 1. Left ventricular ejection fraction, by estimation, is 60 to 65%. The  left ventricle has normal function. The left ventricle has no regional  wall motion abnormalities. There is mild concentric left ventricular  hypertrophy. Left ventricular diastolic  parameters are consistent with Grade II diastolic dysfunction  (pseudonormalization).   2. Right ventricular systolic function is normal. The right ventricular  size is normal. There is mildly elevated pulmonary artery systolic  pressure. The estimated right ventricular systolic pressure is 39.8 mmHg.   3. Left atrial size was moderately dilated.   4. Right atrial size was mild to moderately dilated.   5. The mitral valve is degenerative. Mild mitral valve regurgitation. No  evidence of mitral stenosis. Moderate mitral annular calcification.   6. The aortic valve is tricuspid. There is mild calcification of the  aortic valve. Aortic valve regurgitation is trivial. Aortic valve  sclerosis/calcification is present, without any evidence of aortic  stenosis.   7. The inferior vena cava is  dilated in size with >50% respiratory  variability, suggesting right atrial pressure of 8 mmHg.   Conclusion(s)/Recommendation(s): No evidence of valvular vegetations on  this transthoracic echocardiogram. Consider a transesophageal  echocardiogram to exclude infective endocarditis if clinically indicated.   Patient Profile     89 y.o. female with a history of chronic diastolic CHF, hypertension, hyperlipidemia, type 2 diabetes mellitus, CKD stage III, CVA, recent right gluteal abscess in 08/2022, and obesity. She was recently admitted in 08/2022 for a right gluteal abscess with wet gangrene and necrosis with MRSA coag negative Staph bacteremia which was treated with I&D and extensive debridement, antibiotics, and hydrotherapy. Hospitalization was complicated by AKI with creatinine peaking at 3.57 and home Lasix was stopped. She was discharged to [facility] for rehab. She presented back to the [facility] ED on 10/28/2022 for further evaluation of shortness of breath and progressive lower extremity edema after only being home from SNF for 3 days. She was admitted for acute on chronic CHF and Cardiology consulted for assistance.   Assessment & Plan    Acute on Chronic Diastolic CHF Patient presented with progressive shortness of breath and lower extremity edema. BNP elevated at 728. Chest x-ray showed mild cardiomegaly with small bilateral pleural effusions and mild interstitial edema consistent with CHF. Echo in 08/2022 showed LVEF of 60-65% with mild LVH and grade 2 diastolic dysfunction, normal RV size and function, mild MR, and mildly elevated PASP of 39. . She has been diuresed with IV Lasix which was increased to 80mg  twice daily yesterday. Documented urinary output of 2.19 L yesterday and net negative 9.9 L this admission. Weight is 200 lbs today which is down 9 lbs from yesterday and 30 since 9/2 (question whether this is accurate). Renal function has been slowly increasing the last couple of  days and is 1.4 today.  - Still still has lower extremity edema and lung sound very course with rhonchi and wheezing. Wonder whether she would benefit from a breathing treatment. - Will repeat BNP to help guide [place] with diuresis but will hold IV Lasix for now given hypotension last night, weakness/ lightheadedness today, rising creatinine, and documented weight loss. Will discuss switching to Torsemide with MD. - Continue Spironolactone 25mg  daily. - Will hold off on SGLT2 inhibitor given recent gluteal abscess.  - Continue to monitor daily weights, strict I/Os, and renal function.   Hypertension Patient has been hypertensive throughout most of admission (systolic BP mostly in the 150s to 160s) until last night when BP dropped yesterday to as low as 85/57. Most recent BP is 112/59.  - She appears more somnolent today and states she feels weak and a little lightheaded. No syncope. - Will decrease Coreg to 12.5mg  twice daily.  - Will hold IV Lasix as above. - Continue Spironolactone 25mg  daily. - Continue Hydralazine 100mg  three times daily.   Hyperlipidemia - Continue Lipitor 40mg  daily.   CKD Stage III Creatinine 1.05 on admission. Baseline around 1.2 to 1.3. - Creatinine has been slowly increasing the last couple of days: 1.09 >> 1.22 >> 1.40. - Continue to monitor closely with diuresis.   Otherwise, per primary team: - Right gluteal abscess with necrosis - Type 2 diabetes mellitus - Asthma - Obesity  For questions or updates, please contact [facility] Please consult [email or web address] for contact info under    Signed, Corrin Parker, PA-C  11/03/2022, 9:17 AM    Patient seen and examined, note reviewed with the signed Advanced Practice Provider. I personally reviewed laboratory data, imaging studies and relevant  notes. I independently examined the patient and formulated the important aspects of the plan. I have personally discussed the plan with the patient and/or family.  Comments or changes to the note/plan are indicated below.  Patient seen and examined at her bedside. Family member at bedside. Events overnight noted with transient low blood pressure- has improved.  For me this morning systolics is in the 140s. Will decrease Coreg to 12.5 mg twice daily, hold Lasix for today.  Okay with continuing spironolactone as well as her Aldactone.  Start torsemide 20 mg tomorrow. Continue the Lipitor.    Thomasene Ripple DO, MS [facility] Attending Cardiologist [facility]  [address] [place], [place] [address] [phone] Website: [email or web address]

## 2022-11-03 NOTE — Consult Note (Signed)
WOC Nurse wound follow up;  Wound type: R gluteal full thickness post debridement 08/2022  Measurement:see 10/31/2022 note  Wound bed:R gluteal wound 90% pink, 10% loose slough  4 puncture sites one at sacrum, L glute, R mid buttock and R lower buttock (all filled with silver hydrofiber).   Drainage (amount, consistency, odor) minimal serosanguinous  Periwound: intact  Dressing procedure/placement/frequency: Removed old NPWT dressing Cleansed wound with normal saline Periwound skin protected with skin barrier wipe  Patient prefers not to have trac pad bridge to hip  Filled wound with 1  piece of black foam  Sealed NPWT dressing at HG  Patient received po pain medication (Tylenol) per bedside nurse prior to dressing change Patient tolerated procedure well  WOC nurse will continue to provide NPWT dressing changes M-W-F due to the complexity of the dressing change; next change Monday 11/06/2022.  1 Medium black foam kit in room.    Thank you,    Priscella Mann MSN, RN-BC, [employer] [phone]

## 2022-11-03 NOTE — Progress Notes (Signed)
PROGRESS NOTE    [name] [name]  ZOX:[id number] DOB: 06/08/1932 DOA: 10/28/2022 PCP: [name], MD   Brief Narrative:  This 89 yrs old Female with history of chronic diastolic CHF, type 2 diabetes mellitus, CVA, dyslipidemia, CKD 3B, obesity, hypertension was admitted from 7/12- 7/26 for right gluteal abscess with wet gangrene and necrosis complicated by coag negative staph bacteremia, underwent I&D and debridement per surgery, completed Unasyn  on 7/24 then she went to SNF for rehab with wound VAC.  While at SNF she was taken off Lasix on account of AKI, creatinine trended up to 3.5, subsequently discharged home from rehab 3 days ago, developed worsening shortness of breath and progressive edema. In the ED tachypneic, hypoxic, placed on BiPAP, labs with BNP 728, creatinine 1.05, hemoglobin 9.4, chest x-ray with CHF and small pleural effusions -Slowly improving on diuretics, cards following   Assessment & Plan:   Principal Problem:   Acute on chronic diastolic CHF (congestive heart failure) (HCC) Active Problems:   Dyslipidemia   Morbid obesity (HCC)   Uncontrolled type 2 diabetes mellitus with hyperglycemia (HCC)   Essential hypertension   Chronic kidney disease (CKD), stage III (moderate) (HCC)   Gluteal abscess   Left lower lobe pneumonia   Acute respiratory failure with hypoxia (HCC)   Acute on chronic diastolic CHF: -She was significantly volume overloaded,  has 3+ edema extending to abdomen, thighs. -She required BiPAP on admission. -Recent echo 7/24 noted LVEF of 60-65% with grade 2 diastolic dysfunction and normal RV -She was taken off diuretics 3 weeks ago at [facility] on account of AKI. -Cardiology following, remains significantly volume overloaded, She is 7.2 L negative, weight down 11 LB  -Given albumin with IV Lasix for 2 days, Continue IV Lasix increased dose, Continue Aldactone. -Cardiology states Hold lasix today given low blood pressure last night, resume torsemide  tomorrow.   R gluteal abscess with necrosis: -s/p I&D, debridement. -Continue wound VAC, WOC consulted.   Asthma: - Suspect her wheezes are cardiac, remains significantly volume overloaded  > 20 pounds weight gain - Continue DuoNebs regardless, no indication for steroids now.   CKD Stage 3b: -Creatinine has been slowly increasing. -Stable, avoid hypotension and nephrotoxic meds. Closely monitor serum creatinine with diuresis.   Essential HTN: -Continue hydralazine 100 mg 3 times daily -Continue spironolactone 25 mg daily. -Reduce Coreg to 12.5 mg twice daily -Hold IV Lasix for today.   Hyperlipidemia: -Continue Lipitor 40 mg daily.   DM: -Last HbA1c was 9.8, poor control -Increase glargine, continue meal coverage.   Morbid obesity -Increased morbidity/mortality risk -Weight loss should be encouraged -Outpatient PCP/bariatric medicine f/u encouraged .   DVT prophylaxis:  Lovenox Code Status: Full code Family Communication: Family at bed side. Disposition Plan:   Status is: Inpatient Remains inpatient appropriate because: Still appears fluid overloaded.  Consultants:  Cardiology  Procedures:   Antimicrobials:  Anti-infectives (From admission, onward)    Start     Dose/Rate Route Frequency Ordered Stop   10/28/22 1830  azithromycin (ZITHROMAX) 500 mg in sodium chloride 0.9 % 250 mL IVPB  Status:  Discontinued        500 mg 250 mL/hr over 60 Minutes Intravenous Every 24 hours 10/28/22 1610 10/29/22 1004   10/28/22 1800  cefTRIAXone (ROCEPHIN) 2 g in sodium chloride 0.9 % 100 mL IVPB  Status:  Discontinued        2 g 200 mL/hr over 30 Minutes Intravenous Every 24 hours 10/28/22 1610 10/29/22 1004  Subjective: Patient seen and examined at bedside.  Overnight events noted. She still has significant pedal edema despite getting diuresis. She denies any shortness of breath.  Renal functions are trending up.  Objective: Vitals:   11/03/22 0014 11/03/22 0433  11/03/22 0755 11/03/22 1107  BP: (!) 141/45 (!) 145/42 (!) 112/59 (!) 145/49  Pulse: 74 75 81 77  Resp: 17 18 19 18   Temp: 98 F (36.7 C) 97.7 F (36.5 C) 98.1 F (36.7 C) 97.6 F (36.4 C)  TempSrc: Oral Oral Oral Oral  SpO2: 100% 98% 93% 92%  Weight:  90.7 kg      Intake/Output Summary (Last 24 hours) at 11/03/2022 1409 Last data filed at 11/03/2022 1331 Gross per 24 hour  Intake 440 ml  Output 1890 ml  Net -1450 ml   Filed Weights   11/01/22 0415 11/02/22 0450 11/03/22 0433  Weight: 99.8 kg 94.8 kg 90.7 kg    Examination:  General exam: Appears comfortable, morbidly obese, deconditioned. Respiratory system: Decreased breath sounds.Respiratory effort normal.  RR 16 Cardiovascular system: S1 & S2 heard, RRR.  No murmur.  No pedal edema. Gastrointestinal system: Abdomen is nondistended, soft and nontender. Normal bowel sounds heard. Central nervous system: Alert and oriented x 3. No focal neurological deficits. Extremities: Edema++, no cyanosis, no clubbing  Skin: No rashes, lesions or ulcers Psychiatry: Judgement and insight appear normal. Mood & affect appropriate.     Data Reviewed: I have personally reviewed following labs and imaging studies  CBC: Recent Labs  Lab 10/28/22 1147 10/29/22 0332 10/30/22 0813 10/31/22 0410 11/02/22 0259 11/03/22 0332  WBC 5.3 3.0* 9.1  --  4.9 5.8  HGB 9.4* 9.9* 11.2* 9.7* 10.2* 10.1*  HCT 31.5* 33.0* 37.1 31.7* 33.0* 32.9*  MCV 85.8 84.8 83.4  --  81.9 80.4  PLT 261 268 291  --  231 283   Basic Metabolic Panel: Recent Labs  Lab 10/30/22 0813 10/31/22 0410 11/01/22 0915 11/02/22 0259 11/03/22 0332  NA 143 142 141 140 139  K 3.8 3.6 3.9 4.1 3.5  CL 96* 96* 94* 94* 90*  CO2 29 34* 34* 34* 32  GLUCOSE 169* 130* 196* 138* 146*  BUN 21 21 20 21 22   CREATININE 1.09* 1.26* 1.09* 1.22* 1.40*  CALCIUM 8.5* 8.4* 8.3* 8.3* 8.2*  MG  --  1.4*  --   --  2.4  PHOS  --   --   --   --  4.4   GFR: Estimated Creatinine Clearance:  31.5 mL/min (A) (by C-G formula based on SCr of 1.4 mg/dL (H)). Liver Function Tests: Recent Labs  Lab 10/29/22 0332 10/30/22 0813  AST  --  27  ALT  --  16  ALKPHOS  --  70  BILITOT  --  0.6  PROT  --  6.4*  ALBUMIN 2.2* 2.5*   No results for input(s): "LIPASE", "AMYLASE" in the last 168 hours. No results for input(s): "AMMONIA" in the last 168 hours. Coagulation Profile: No results for input(s): "INR", "PROTIME" in the last 168 hours. Cardiac Enzymes: No results for input(s): "CKTOTAL", "CKMB", "CKMBINDEX", "TROPONINI" in the last 168 hours. BNP (last 3 results) No results for input(s): "PROBNP" in the last 8760 hours. HbA1C: No results for input(s): "HGBA1C" in the last 72 hours. CBG: Recent Labs  Lab 11/02/22 1127 11/02/22 1553 11/02/22 2053 11/03/22 0540 11/03/22 1104  GLUCAP 154* 132* 158* 140* 198*   Lipid Profile: No results for input(s): "CHOL", "HDL", "LDLCALC", "TRIG", "CHOLHDL", "LDLDIRECT"  in the last 72 hours. Thyroid Function Tests: No results for input(s): "TSH", "T4TOTAL", "FREET4", "T3FREE", "THYROIDAB" in the last 72 hours. Anemia Panel: No results for input(s): "VITAMINB12", "FOLATE", "FERRITIN", "TIBC", "IRON", "RETICCTPCT" in the last 72 hours. Sepsis Labs: No results for input(s): "PROCALCITON", "LATICACIDVEN" in the last 168 hours.  No results found for this or any previous visit (from the past 240 hour(s)).   Radiology Studies: No results found.  Scheduled Meds:  amantadine  100 mg Oral Daily   ascorbic acid  500 mg Oral Daily   aspirin EC  81 mg Oral Daily   atorvastatin  40 mg Oral Daily   carvedilol  12.5 mg Oral BID WC   diclofenac Sodium  2 g Topical TID   enoxaparin (LOVENOX) injection  40 mg Subcutaneous Q24H   feeding supplement (GLUCERNA SHAKE)  237 mL Oral TID BM   guaiFENesin  600 mg Oral BID   hydrALAZINE  100 mg Oral TID   insulin aspart  0-15 Units Subcutaneous TID WC   insulin aspart  0-5 Units Subcutaneous QHS    insulin aspart  3 Units Subcutaneous TID WC   insulin glargine-yfgn  25 Units Subcutaneous BID   lidocaine  2 patch Transdermal Q24H   multivitamin with minerals  1 tablet Oral Daily   pantoprazole  40 mg Oral Daily   senna-docusate  1 tablet Oral BID   sodium chloride flush  3 mL Intravenous Q12H   spironolactone  25 mg Oral Daily   [START ON 11/04/2022] torsemide  20 mg Oral Daily   zinc sulfate  220 mg Oral Daily   Continuous Infusions:   LOS: 6 days    Time spent: 35 MINS    Willeen Niece, MD [facility]   If 7PM-7AM, please contact night-coverage

## 2022-11-04 DIAGNOSIS — I10 Essential (primary) hypertension: Secondary | ICD-10-CM | POA: Diagnosis not present

## 2022-11-04 DIAGNOSIS — I5033 Acute on chronic diastolic (congestive) heart failure: Secondary | ICD-10-CM | POA: Diagnosis not present

## 2022-11-04 DIAGNOSIS — N1832 Chronic kidney disease, stage 3b: Secondary | ICD-10-CM

## 2022-11-04 DIAGNOSIS — J9601 Acute respiratory failure with hypoxia: Secondary | ICD-10-CM | POA: Diagnosis not present

## 2022-11-04 LAB — CBC
HCT: 30.2 % — ABNORMAL LOW (ref 36.0–46.0)
Hemoglobin: 9.4 g/dL — ABNORMAL LOW (ref 12.0–15.0)
MCH: 25.2 pg — ABNORMAL LOW (ref 26.0–34.0)
MCHC: 31.1 g/dL (ref 30.0–36.0)
MCV: 81 fL (ref 80.0–100.0)
Platelets: 245 10*3/uL (ref 150–400)
RBC: 3.73 MIL/uL — ABNORMAL LOW (ref 3.87–5.11)
RDW: 20.2 % — ABNORMAL HIGH (ref 11.5–15.5)
WBC: 7.6 10*3/uL (ref 4.0–10.5)
nRBC: 0 % (ref 0.0–0.2)

## 2022-11-04 LAB — GLUCOSE, CAPILLARY
Glucose-Capillary: 126 mg/dL — ABNORMAL HIGH (ref 70–99)
Glucose-Capillary: 130 mg/dL — ABNORMAL HIGH (ref 70–99)

## 2022-11-04 LAB — BASIC METABOLIC PANEL
Anion gap: 13 (ref 5–15)
BUN: 22 mg/dL (ref 8–23)
CO2: 32 mmol/L (ref 22–32)
Calcium: 7.9 mg/dL — ABNORMAL LOW (ref 8.9–10.3)
Chloride: 91 mmol/L — ABNORMAL LOW (ref 98–111)
Creatinine, Ser: 1.43 mg/dL — ABNORMAL HIGH (ref 0.44–1.00)
GFR, Estimated: 35 mL/min — ABNORMAL LOW (ref >60)
Glucose, Bld: 125 mg/dL — ABNORMAL HIGH (ref 70–99)
Potassium: 3.9 mmol/L (ref 3.5–5.1)
Sodium: 136 mmol/L (ref 135–145)

## 2022-11-04 MED ORDER — ALBUTEROL SULFATE (2.5 MG/3ML) 0.083% IN NEBU: 2.5000 mg | INHALATION_SOLUTION | RESPIRATORY_TRACT | 12 refills | Status: AC | PRN

## 2022-11-04 MED ORDER — GUAIFENESIN ER 600 MG PO TB12: 600.0000 mg | ORAL_TABLET | Freq: Two times a day (BID) | ORAL | 0 refills | Status: AC

## 2022-11-04 MED ORDER — CARVEDILOL 12.5 MG PO TABS: 12.5000 mg | ORAL_TABLET | Freq: Two times a day (BID) | ORAL | 0 refills | Status: DC

## 2022-11-04 MED ORDER — TORSEMIDE 20 MG PO TABS: 20.0000 mg | ORAL_TABLET | Freq: Every day | ORAL | 0 refills | Status: DC

## 2022-11-04 MED ORDER — SPIRONOLACTONE 25 MG PO TABS: 25.0000 mg | ORAL_TABLET | Freq: Every day | ORAL | 0 refills | Status: DC

## 2022-11-04 NOTE — Progress Notes (Addendum)
Rounding Note    Patient Name: [name] [name] Date of Encounter: 11/04/2022  [facility] Cardiologist: None   Subjective   BP 131/60 this morning.  Creatinine stable at 1.4.  Reports dyspnea has improved  Inpatient Medications    Scheduled Meds:  amantadine  100 mg Oral Daily   ascorbic acid  500 mg Oral Daily   aspirin EC  81 mg Oral Daily   atorvastatin  40 mg Oral Daily   carvedilol  12.5 mg Oral BID WC   diclofenac Sodium  2 g Topical TID   enoxaparin (LOVENOX) injection  40 mg Subcutaneous Q24H   feeding supplement (GLUCERNA SHAKE)  237 mL Oral TID BM   guaiFENesin  600 mg Oral BID   hydrALAZINE  100 mg Oral TID   insulin aspart  0-15 Units Subcutaneous TID WC   insulin aspart  0-5 Units Subcutaneous QHS   insulin aspart  3 Units Subcutaneous TID WC   insulin glargine-yfgn  25 Units Subcutaneous BID   lidocaine  2 patch Transdermal Q24H   multivitamin with minerals  1 tablet Oral Daily   pantoprazole  40 mg Oral Daily   senna-docusate  1 tablet Oral BID   sodium chloride flush  3 mL Intravenous Q12H   spironolactone  25 mg Oral Daily   torsemide  20 mg Oral Daily   zinc sulfate  220 mg Oral Daily   Continuous Infusions:  PRN Meds: acetaminophen **OR** acetaminophen, albuterol, guaiFENesin-dextromethorphan, hydrALAZINE, HYDROmorphone (DILAUDID) injection, methocarbamol, ondansetron **OR** ondansetron (ZOFRAN) IV, oxyCODONE, polyethylene glycol, QUEtiapine, traZODone   Vital Signs    Vitals:   11/04/22 0412 11/04/22 0500 11/04/22 0751 11/04/22 0813  BP: 116/65  (!) 171/58 131/60  Pulse: 93  86 95  Resp: 20   18  Temp: (!) 97.2 F (36.2 C)   97.8 F (36.6 C)  TempSrc: Oral   Oral  SpO2: 100%   98%  Weight:  93 kg      Intake/Output Summary (Last 24 hours) at 11/04/2022 0826 Last data filed at 11/04/2022 0418 Gross per 24 hour  Intake --  Output 2310 ml  Net -2310 ml      11/04/2022    5:00 AM 11/03/2022    4:33 AM 11/02/2022    4:50 AM   Last 3 Weights  Weight (lbs) 205 lb 200 lb 209 lb  Weight (kg) 92.987 kg 90.719 kg 94.802 kg      Telemetry    Normal sinus rhythm.  Episode of SVT x 7 seconds- Personally Reviewed  ECG    No new ECG tracing today. - Personally Reviewed  Physical Exam   GEN:  in no acute distress.  Currently on 2L of O2 via nasal cannula. Neck: No JVD. Cardiac: RRR. No murmurs, rubs, or gallops.  Respiratory: No increased work of breathing. Diminished breaths sounds in bilateral bases.  No wheezing GI: Soft, non-distended MS: 1+ pitting edema of bilateral lower extremities (right  > left). No deformity. Skin: Warm and dry. Neuro:  No focal deficits. Alert and oriented Psych: Normal affect. Responds appropriately.   Labs    High Sensitivity Troponin:   Recent Labs  Lab 10/28/22 1148 10/28/22 1337  TROPONINIHS 24* 23*     Chemistry Recent Labs  Lab 10/29/22 0332 10/30/22 0813 10/31/22 0410 11/01/22 0915 11/02/22 0259 11/03/22 0332 11/04/22 0236  NA 141 143 142   < > 140 139 136  K 4.4 3.8 3.6   < > 4.1  3.5 3.9  CL 101 96* 96*   < > 94* 90* 91*  CO2 26 29 34*   < > 34* 32 32  GLUCOSE 367* 169* 130*   < > 138* 146* 125*  BUN 19 21 21    < > 21 22 22   CREATININE 1.25* 1.09* 1.26*   < > 1.22* 1.40* 1.43*  CALCIUM 8.1* 8.5* 8.4*   < > 8.3* 8.2* 7.9*  MG  --   --  1.4*  --   --  2.4  --   PROT  --  6.4*  --   --   --   --   --   ALBUMIN 2.2* 2.5*  --   --   --   --   --   AST  --  27  --   --   --   --   --   ALT  --  16  --   --   --   --   --   ALKPHOS  --  70  --   --   --   --   --   BILITOT  --  0.6  --   --   --   --   --   GFRNONAA 41* 49* 41*   < > 43* 36* 35*  ANIONGAP 14 18* 12   < > 12 17* 13   < > = values in this interval not displayed.    Lipids No results for input(s): "CHOL", "TRIG", "HDL", "LABVLDL", "LDLCALC", "CHOLHDL" in the last 168 hours.  Hematology Recent Labs  Lab 11/02/22 0259 11/03/22 0332 11/04/22 0236  WBC 4.9 5.8 7.6  RBC 4.03 4.09 3.73*   HGB 10.2* 10.1* 9.4*  HCT 33.0* 32.9* 30.2*  MCV 81.9 80.4 81.0  MCH 25.3* 24.7* 25.2*  MCHC 30.9 30.7 31.1  RDW 20.2* 20.2* 20.2*  PLT 231 283 245   Thyroid No results for input(s): "TSH", "FREET4" in the last 168 hours.  BNP Recent Labs  Lab 10/28/22 1147 11/03/22 1042  BNP 728.8* 440.7*    DDimer No results for input(s): "DDIMER" in the last 168 hours.   Radiology    No results found.  Cardiac Studies   Echocardiogram 09/12/2022: Impressions: 1. Left ventricular ejection fraction, by estimation, is 60 to 65%. The  left ventricle has normal function. The left ventricle has no regional  wall motion abnormalities. There is mild concentric left ventricular  hypertrophy. Left ventricular diastolic  parameters are consistent with Grade II diastolic dysfunction  (pseudonormalization).   2. Right ventricular systolic function is normal. The right ventricular  size is normal. There is mildly elevated pulmonary artery systolic  pressure. The estimated right ventricular systolic pressure is 39.8 mmHg.   3. Left atrial size was moderately dilated.   4. Right atrial size was mild to moderately dilated.   5. The mitral valve is degenerative. Mild mitral valve regurgitation. No  evidence of mitral stenosis. Moderate mitral annular calcification.   6. The aortic valve is tricuspid. There is mild calcification of the  aortic valve. Aortic valve regurgitation is trivial. Aortic valve  sclerosis/calcification is present, without any evidence of aortic  stenosis.   7. The inferior vena cava is dilated in size with >50% respiratory  variability, suggesting right atrial pressure of 8 mmHg.   Conclusion(s)/Recommendation(s): No evidence of valvular vegetations on  this transthoracic echocardiogram. Consider a transesophageal  echocardiogram to exclude infective endocarditis if clinically indicated.   Patient  Profile     89 y.o. female history of diabetes, CVA, hypertension, CKD stage  IIIb presented to hospital with shortness of breath and diastolic heart failure.   Assessment & Plan    Acute on chronic diastolic heart failure: Echo 09/12/2022 showed EF 60 to 65%, grade 2 diastolic dysfunction, normal RV function, moderate biatrial enlargement.  Has been taken off diuretics at SNF 3 weeks ago due to AKI.  Presented with volume overload, BNP 729.  Low albumin also contributing to volume overload -Has diuresed well, net negative over 12 L this admission.  Diuretics were held yesterday due to soft BP.  Normotensive this morning.  Plan to resume p.o. torsemide today   Acute hypoxic respiratory failure: Likely multifactorial with acute on chronic diastolic heart failure and asthma exacerbation contributing.  Treatment of asthma per primary team.   Hypertension: On carvedilol 12.5 mg twice daily, hydralazine 100 mg 3 times daily, spironolactone 25 mg daily   Hyperlipidemia: Continue Lipitor   Diabetes: A1c 9.8, management per primary team  CKD stage IIIb: Creatinine 1.4 today  [facility] will sign off.   Medication Recommendations:  per [facility] Other recommendations (labs, testing, etc):  Needs BMET within 1 week Follow up as an outpatient:  has appointment scheduled 9/16 with Dr Servando Salina  Little Ishikawa, MD

## 2022-11-04 NOTE — Discharge Instructions (Signed)
Advised to follow-up with primary care physician in 1 week. Advised to follow-up with cardiology as scheduled. Advised to resume torsemide 20 mg daily. Metoprolol was discontinued and started on Coreg 12.5 mg twice daily.

## 2022-11-04 NOTE — TOC Transition Note (Addendum)
Transition of Care [facility]) - CM/SW Discharge Note   Patient Details  Name: [name] [name] MRN: [id number] Date of Birth: 05/03/1932  [facility]) CM/SW Contact:  Lawerance Sabal, RN Phone Number: 11/04/2022, 12:19 PM   Clinical Narrative:     Notified Angie w [facility] that patient will DC to home today.  Verified w bedside nurse that patient is on RA.  Nebulizer ordered to the room.  Nurse states they have a home wound VAC  No other TOC needs identified for DC    Final next level of care: Home w Home Health Services Barriers to Discharge: No Barriers Identified   Patient Goals and CMS Choice   Choice offered to / list presented to : NA  Discharge Placement                         Discharge Plan and Services Additional resources added to the After Visit Summary for   In-house Referral: NA Discharge Planning Services: CM Consult Post Acute Care Choice: Home Health          DME Arranged: N/A DME Agency: NA       HH Arranged: PT, OT HH Agency: [facility] Date [facility] Agency Contacted: 11/04/22 Time HH Agency Contacted: [id number] Representative spoke with at [facility] Agency: Angie  Social Determinants of Health (SDOH) Interventions SDOH Screenings   Food Insecurity: No Food Insecurity (11/02/2022)  Housing: Low Risk  (11/02/2022)  Transportation Needs: No Transportation Needs (11/02/2022)  Utilities: Not At Risk (11/02/2022)  Depression (PHQ2-9): Low Risk  (03/27/2022)  Financial Resource Strain: Low Risk  (06/14/2022)   Received from [facility], [facility]  Physical Activity: Inactive (05/19/2019)   Received from [facility], [facility]  Social Connections: Socially Integrated (12/05/2021)   Received from [facility], [facility]  Stress: No Stress Concern Present (12/05/2021)   Received from [facility], [facility]  Tobacco Use: Low Risk  (11/01/2022)   Received from [facility]     Readmission Risk Interventions     10/31/2022    6:04 PM  Readmission Risk Prevention Plan  Transportation Screening Complete  Medication Review (RN Care Manager) Complete  HRI or Home Care Consult Complete

## 2022-11-04 NOTE — Discharge Summary (Signed)
Physician Discharge Summary  [name] [name] ZHY:[id number] DOB: 03-Mar-1932 DOA: 10/28/2022  PCP: [name], MD  Admit date: 10/28/2022  Discharge date: 11/04/2022  Admitted From: Home.  Disposition:  Home Health  Recommendations for Outpatient Follow-up:  Follow up with PCP in 1-2 weeks. Please obtain BMP/CBC in one week. Advised to follow-up with Cardiology as scheduled. Advised to resume torsemide 20 mg daily. Metoprolol was discontinued and started on Coreg 12.5 mg twice daily.  Home Health:Home PT/OT Equipment/Devices: Home Oxygen  Discharge Condition: Stable CODE STATUS:DNR Diet recommendation: Heart Healthy  Brief Summary/ Hospital Course: This 89 yrs old Female with history of chronic diastolic CHF, type 2 diabetes mellitus, CVA, dyslipidemia, CKD 3B, obesity, hypertension was admitted from 7/12- 7/26 for right gluteal abscess with wet gangrene and necrosis complicated by coag negative staph bacteremia, underwent I&D and debridement per surgery, completed Unasyn on 7/24 then she went to SNF for rehab with wound VAC.  While at SNF she was taken off Lasix on account of AKI, creatinine trended up to 3.5, subsequently discharged home from rehab 3 days ago, developed worsening shortness of breath and progressive edema. In the ED tachypneic, hypoxic, placed on BiPAP, labs with BNP 728, creatinine 1.05, hemoglobin 9.4, chest x-ray with CHF and small pleural effusions.  Patient was admitted.  And was started on diuretics. She has significantly improved and resumed back on oral torsemide.  Cardiology signed off, recommended patient should follow-up with cardiology outpatient.  Patient feels better, home health services arranged.  Patient being discharged home.  Discharge Diagnoses:  Principal Problem:   Acute on chronic diastolic CHF (congestive heart failure) (HCC) Active Problems:   Dyslipidemia   Morbid obesity (HCC)   Uncontrolled type 2 diabetes mellitus with hyperglycemia  (HCC)   Essential hypertension   Chronic kidney disease (CKD), stage III (moderate) (HCC)   Gluteal abscess   Left lower lobe pneumonia   Acute respiratory failure with hypoxia (HCC)  Acute on chronic diastolic CHF: -She was significantly volume overloaded,  has 3+ edema extending to abdomen, thighs. -She required BiPAP on admission. -Recent echo 7/24 noted LVEF of 60-65% with grade 2 diastolic dysfunction and normal RV -She was taken off diuretics 3 weeks ago at [facility] on account of AKI. -Cardiology following, remains significantly volume overloaded, She is 7.2 L negative, weight down 11 LB  -Given albumin with IV Lasix for 2 days, Continue IV Lasix increased dose, Continue Aldactone. -Cardiology states Hold lasix today given low blood pressure last night, resume torsemide tomorrow. -Patient feels better and resumed back on torsemide.  Cardiology signed off.   R gluteal abscess with necrosis: -s/p I&D, debridement. -Continue wound VAC, WOC consulted.   Asthma: - Suspect her wheezes are cardiac, remains significantly volume overloaded  > 20 pounds weight gain - Continue DuoNebs regardless, no indication for steroids now.   CKD Stage 3b: -Creatinine has been slowly increasing. -Stable, avoid hypotension and nephrotoxic meds. -Closely monitor serum creatinine with diuresis.   Essential HTN: -Continue hydralazine 100 mg 3 times daily -Continue spironolactone 25 mg daily. -Reduce Coreg to 12.5 mg twice daily -Resume torsemide from today.   Hyperlipidemia: -Continue Lipitor 40 mg daily.   DM: -Last HbA1c was 9.8, poor control -Increase glargine, continue meal coverage.   Morbid obesity -Increased morbidity/mortality risk -Weight loss should be encouraged -Outpatient PCP/bariatric medicine f/u encouraged .  Discharge Instructions  Discharge Instructions     Call MD for:  difficulty breathing, headache or visual disturbances   Complete by: As  directed    Call MD for:   persistant dizziness or light-headedness   Complete by: As directed    Call MD for:  persistant nausea and vomiting   Complete by: As directed    Diet - low sodium heart healthy   Complete by: As directed    Diet Carb Modified   Complete by: As directed    Discharge instructions   Complete by: As directed    Advised to follow-up with primary care physician in 1 week. Advised to follow-up with cardiology as scheduled. Advised to resume torsemide 20 mg daily. Metoprolol was discontinued and started on Coreg 12.5 mg twice daily.   Discharge wound care:   Complete by: As directed    Follow-up primary care physician in 1 week.   Increase activity slowly   Complete by: As directed       Allergies as of 11/04/2022       Reactions   Amlodipine Other (See Comments)   EDEMA and "Allergic," per [facility] States she can take.    Ace Inhibitors Other (See Comments), Cough   "Allergic," per [facility]   Atorvastatin Other (See Comments)   Muscle & joint aches and "Allergic," per [facility] States can take.    Canagliflozin Other (See Comments)   INVOKANA = urinary tract infections and "Allergic," per [facility]   Clonidine Hcl Other (See Comments)   "Allergic," per [facility]   Buprenorphine Other (See Comments)   "Allergic," per [facility]   Lisinopril Other (See Comments)   "Allergic," per [facility]   Morphine And Codeine Rash, Other (See Comments)   For long periods of time (??) and "Allergic," per [facility]        Medication List     STOP taking these medications    amoxicillin-clavulanate 875-125 MG tablet Commonly known as: AUGMENTIN   azithromycin 500 MG tablet Commonly known as: ZITHROMAX   metoprolol tartrate 100 MG tablet Commonly known as: LOPRESSOR       TAKE these medications    acetaminophen 500 MG tablet Commonly known as: TYLENOL Take 1,000 mg by mouth 2 (two) times daily.   albuterol 108 (90 Base) MCG/ACT inhaler Commonly known as: VENTOLIN HFA Inhale 2 puffs into the lungs every 6 (six) hours as  needed for wheezing or shortness of breath. What changed: Another medication with the same name was added. Make sure you understand how and when to take each.   albuterol (2.5 MG/3ML) 0.083% nebulizer solution Commonly known as: PROVENTIL Take 3 mLs (2.5 mg total) by nebulization every 4 (four) hours as needed for wheezing or shortness of breath (per home regimen). What changed: You were already taking a medication with the same name, and this prescription was added. Make sure you understand how and when to take each.   amantadine 100 MG capsule Commonly known as: SYMMETREL Take 1 capsule (100 mg total) by mouth daily.   amLODipine 2.5 MG tablet Commonly known as: NORVASC Take 2.5 mg by mouth daily.   ASPIRIN 81 PO Take 81 mg by mouth in the morning.   atorvastatin 40 MG tablet Commonly known as: Lipitor Take 1 tablet (40 mg total) by mouth daily.   B-complex with vitamin C tablet Take 1 tablet by mouth daily.   bisacodyl 5 MG EC tablet Take 5 mg by mouth daily as needed for moderate constipation.   carvedilol 12.5 MG tablet Commonly known as: COREG Take 1 tablet (12.5 mg total) by mouth 2 (two) times daily with a meal.  Dexcom G6 Receiver Devi 1 each by Does not apply route daily.   Dexcom G6 Sensor Misc 1 each by Does not apply route daily. What changed:  how much to take how to take this when to take this additional instructions   Dexcom G6 Transmitter Misc 1 each by Does not apply route daily.   diclofenac Sodium 1 % Gel Commonly known as: Voltaren Arthritis Pain Apply 2 g topically 4 (four) times daily. What changed:  how much to take when to take this additional instructions   ferrous sulfate 325 (65 FE) MG tablet Take 325 mg by mouth every other day.   Glucerna Liqd Take 237 mLs by mouth daily.   guaiFENesin 600 MG 12 hr tablet Commonly known as: MUCINEX Take 1 tablet (600 mg total) by mouth 2 (two) times daily for 3 days.   hydrALAZINE 100 MG  tablet Commonly known as: APRESOLINE Take 1 tablet (100 mg total) by mouth 3 (three) times daily.   insulin lispro 100 UNIT/ML injection Commonly known as: HUMALOG Inject 0-10 Units into the skin 4 (four) times daily.   Insulin Syringe-Needle U-100 31G X 5/16" 1 ML Misc 1 application by Does not apply route 2 (two) times daily.   Lantus SoloStar 100 UNIT/ML Solostar Pen Generic drug: insulin glargine Inject 10 Units into the skin 2 (two) times daily. What changed: how much to take   lidocaine 5 % Commonly known as: LIDODERM APPLY (1) PATCH TO LOWER BACK AND RIGHT KNEE ONCE DAILY. LEAVE ON 12 HOURS. LEAVE OFF 12 HOURS. What changed: See the new instructions.   methocarbamol 500 MG tablet Commonly known as: ROBAXIN Take 250 mg by mouth at bedtime as needed for muscle spasms.   pantoprazole 40 MG tablet Commonly known as: Protonix Take 1 tablet (40 mg total) by mouth daily.   polyethylene glycol 17 g packet Commonly known as: MIRALAX / GLYCOLAX Take 17 g by mouth 2 (two) times daily as needed for mild constipation or moderate constipation.   QUEtiapine 25 MG tablet Commonly known as: SEROQUEL Take 0.5 tablets (12.5 mg total) by mouth at bedtime as needed (anxiety).   senna-docusate 8.6-50 MG tablet Commonly known as: Senokot-S Take 1 tablet by mouth 2 (two) times daily.   sodium hypochlorite 0.125 % Soln Commonly known as: DAKIN'S 1/4 STRENGTH Irrigate with 1 Application as directed every Monday, Wednesday, and Friday.   spironolactone 25 MG tablet Commonly known as: ALDACTONE Take 1 tablet (25 mg total) by mouth daily.   torsemide 20 MG tablet Commonly known as: DEMADEX Take 1 tablet (20 mg total) by mouth daily.   traMADol 50 MG tablet Commonly known as: ULTRAM Take 50 mg by mouth at bedtime as needed for moderate pain.   traMADol-acetaminophen 37.5-325 MG tablet Commonly known as: ULTRACET Take 1 tablet by mouth 2 (two) times daily.                Durable Medical Equipment  (From admission, onward)           Start     Ordered   11/04/22 1312  For home use only DME Nebulizer machine  Once       Question Answer Comment  Patient needs a nebulizer to treat with the following condition COPD with acute exacerbation (HCC)   Length of Need Lifetime      11/04/22 1311              Discharge Care Instructions  (From admission, onward)  Start     Ordered   11/04/22 0000  Discharge wound care:       Comments: Follow-up primary care physician in 1 week.   11/04/22 1113            Follow-up Information     [name], MD Follow up in 1 week(s).   Specialty: Family Medicine Contact information: [address] [facility] [place] [address] [phone]         Thomasene Ripple, DO Follow up in 1 week(s).   Specialty: Cardiology Contact information: [address] [place] [address] [phone]                Allergies  Allergen Reactions   Amlodipine Other (See Comments)    EDEMA and "Allergic," per [facility]  States she can take.    Ace Inhibitors Other (See Comments) and Cough    "Allergic," per [facility]   Atorvastatin Other (See Comments)    Muscle & joint aches and "Allergic," per [facility]  States can take.    Canagliflozin Other (See Comments)    INVOKANA = urinary tract infections and "Allergic," per [facility]   Clonidine Hcl Other (See Comments)    "Allergic," per [facility]   Buprenorphine Other (See Comments)    "Allergic," per [facility]   Lisinopril Other (See Comments)    "Allergic," per [facility]   Morphine And Codeine Rash and Other (See Comments)    For long periods of time (??) and "Allergic," per [facility]    Consultations: Cardiology   Procedures/Studies: DG Chest 2 View  Result Date: 10/28/2022 CLINICAL DATA:  Shortness of breath and wheezing. Recently diagnosed with pneumonia EXAM: CHEST - 2 VIEW COMPARISON:  09/16/2022 FINDINGS: AP and lateral views.  Midline trachea. Mild cardiomegaly. Atherosclerosis in the transverse aorta. Small bilateral pleural effusions. No pneumothorax. Mild interstitial edema. No lobar consolidation. Numerous leads and wires project over the chest. Mild similar bibasilar atelectasis. IMPRESSION: Similar appearance of congestive heart failure and small bilateral pleural effusions. Aortic Atherosclerosis (ICD10-I70.0). Electronically Signed   By: Jeronimo Greaves M.D.   On: 10/28/2022 12:13     Subjective: Patient was seen and examined at bedside.  Overnight events noted.   Patient report doing much better,  wants to be discharged.  Home health services arranged.  Discharge Exam: Vitals:   11/04/22 0813 11/04/22 1125  BP: 131/60 (!) 118/57  Pulse: 95 89  Resp: 18 18  Temp: 97.8 F (36.6 C) 97.9 F (36.6 C)  SpO2: 98% 90%   Vitals:   11/04/22 0500 11/04/22 0751 11/04/22 0813 11/04/22 1125  BP:  (!) 171/58 131/60 (!) 118/57  Pulse:  86 95 89  Resp:   18 18  Temp:   97.8 F (36.6 C) 97.9 F (36.6 C)  TempSrc:   Oral Oral  SpO2:   98% 90%  Weight: 93 kg       General: Pt is alert, awake, not in acute distress Cardiovascular: RRR, S1/S2 +, no rubs, no gallops Respiratory: CTA bilaterally, no wheezing, no rhonchi Abdominal: Soft, NT, ND, bowel sounds + Extremities: no edema, no cyanosis    The results of significant diagnostics from this hospitalization (including imaging, microbiology, ancillary and laboratory) are listed below for reference.     Microbiology: No results found for this or any previous visit (from the past 240 hour(s)).   Labs: BNP (last 3 results) Recent Labs    10/28/22 1147 11/03/22 1042  BNP 728.8*  440.7*   Basic Metabolic Panel: Recent Labs  Lab 10/31/22 0410 11/01/22 0915 11/02/22 0259 11/03/22 0332 11/04/22 0236  NA 142 141 140 139 136  K 3.6 3.9 4.1 3.5 3.9  CL 96* 94* 94* 90* 91*  CO2 34* 34* 34* 32 32  GLUCOSE 130* 196* 138* 146* 125*  BUN 21 20 21 22 22    CREATININE 1.26* 1.09* 1.22* 1.40* 1.43*  CALCIUM 8.4* 8.3* 8.3* 8.2* 7.9*  MG 1.4*  --   --  2.4  --   PHOS  --   --   --  4.4  --    Liver Function Tests: Recent Labs  Lab 10/29/22 0332 10/30/22 0813  AST  --  27  ALT  --  16  ALKPHOS  --  70  BILITOT  --  0.6  PROT  --  6.4*  ALBUMIN 2.2* 2.5*   No results for input(s): "LIPASE", "AMYLASE" in the last 168 hours. No results for input(s): "AMMONIA" in the last 168 hours. CBC: Recent Labs  Lab 10/29/22 0332 10/30/22 0813 10/31/22 0410 11/02/22 0259 11/03/22 0332 11/04/22 0236  WBC 3.0* 9.1  --  4.9 5.8 7.6  HGB 9.9* 11.2* 9.7* 10.2* 10.1* 9.4*  HCT 33.0* 37.1 31.7* 33.0* 32.9* 30.2*  MCV 84.8 83.4  --  81.9 80.4 81.0  PLT 268 291  --  231 283 245   Cardiac Enzymes: No results for input(s): "CKTOTAL", "CKMB", "CKMBINDEX", "TROPONINI" in the last 168 hours. BNP: Invalid input(s): "POCBNP" CBG: Recent Labs  Lab 11/03/22 1104 11/03/22 1605 11/03/22 2122 11/04/22 0601 11/04/22 1124  GLUCAP 198* 159* 164* 126* 130*   D-Dimer No results for input(s): "DDIMER" in the last 72 hours. Hgb A1c No results for input(s): "HGBA1C" in the last 72 hours. Lipid Profile No results for input(s): "CHOL", "HDL", "LDLCALC", "TRIG", "CHOLHDL", "LDLDIRECT" in the last 72 hours. Thyroid function studies No results for input(s): "TSH", "T4TOTAL", "T3FREE", "THYROIDAB" in the last 72 hours.  Invalid input(s): "FREET3" Anemia work up No results for input(s): "VITAMINB12", "FOLATE", "FERRITIN", "TIBC", "IRON", "RETICCTPCT" in the last 72 hours. Urinalysis    Component Value Date/Time   COLORURINE YELLOW 09/13/2022 1652   APPEARANCEUR CLEAR 09/13/2022 1652   LABSPEC 1.015 09/13/2022 1652   PHURINE 5.0 09/13/2022 1652   GLUCOSEU NEGATIVE 09/13/2022 1652   HGBUR SMALL (A) 09/13/2022 1652   BILIRUBINUR NEGATIVE 09/13/2022 1652   KETONESUR NEGATIVE 09/13/2022 1652   PROTEINUR 30 (A) 09/13/2022 1652   UROBILINOGEN 0.2 07/23/2009  1015   NITRITE NEGATIVE 09/13/2022 1652   LEUKOCYTESUR NEGATIVE 09/13/2022 1652   Sepsis Labs Recent Labs  Lab 10/30/22 0813 11/02/22 0259 11/03/22 0332 11/04/22 0236  WBC 9.1 4.9 5.8 7.6   Microbiology No results found for this or any previous visit (from the past 240 hour(s)).   Time coordinating discharge: Over 30 minutes  SIGNED:   Willeen Niece, MD  [facility] 11/04/2022, 3:11 PM Pager   If 7PM-7AM, please contact night-coverage

## 2022-11-10 ENCOUNTER — Encounter: Payer: Medicare Other | Attending: Physician Assistant | Admitting: Physician Assistant

## 2022-11-10 DIAGNOSIS — M1990 Unspecified osteoarthritis, unspecified site: Secondary | ICD-10-CM | POA: Diagnosis not present

## 2022-11-10 DIAGNOSIS — E1122 Type 2 diabetes mellitus with diabetic chronic kidney disease: Secondary | ICD-10-CM | POA: Insufficient documentation

## 2022-11-10 DIAGNOSIS — L98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: Secondary | ICD-10-CM | POA: Diagnosis not present

## 2022-11-10 DIAGNOSIS — I130 Hypertensive heart and chronic kidney disease with heart failure and stage 1 through stage 4 chronic kidney disease, or unspecified chronic kidney disease: Secondary | ICD-10-CM | POA: Insufficient documentation

## 2022-11-10 DIAGNOSIS — D631 Anemia in chronic kidney disease: Secondary | ICD-10-CM | POA: Insufficient documentation

## 2022-11-10 DIAGNOSIS — I5042 Chronic combined systolic (congestive) and diastolic (congestive) heart failure: Secondary | ICD-10-CM | POA: Diagnosis not present

## 2022-11-10 DIAGNOSIS — E11622 Type 2 diabetes mellitus with other skin ulcer: Secondary | ICD-10-CM | POA: Insufficient documentation

## 2022-11-10 DIAGNOSIS — N183 Chronic kidney disease, stage 3 unspecified: Secondary | ICD-10-CM | POA: Insufficient documentation

## 2022-11-10 NOTE — Progress Notes (Signed)
[name], [name] ([id number]) [id number] Page 1 of 5 Visit Report for 11/10/2022 Abuse Risk Screen Details Patient Name: Date of Service: [name], [name] 11/10/2022 10:00 A M Medical Record Number: [id number] Patient Account Number: [id number] Date of Birth/Sex: Treating RN: 08-05-1932 (89 y.o. [name] [name] Primary Care [name] [name]: [name] Other Clinician: Referring Dawnisha Marquina: Treating Liberti Appleton/Extender: Meredith Mody Weeks in Treatment: 0 Abuse Risk Screen Items Answer ABUSE RISK SCREEN: Has anyone close to you tried to hurt or harm you recentlyo No Do you feel uncomfortable with anyone in your familyo No Has anyone forced you do things that you didnt want to doo No Electronic Signature(s) Signed: 11/10/2022 1:13:27 PM By: Angelina Pih Entered By: Angelina Pih on 11/10/2022 10:25:34 -------------------------------------------------------------------------------- Activities of Daily Living Details Patient Name: Date of Service: [name], [name] 11/10/2022 10:00 A M Medical Record Number: [id number] Patient Account Number: [id number] Date of Birth/Sex: Treating RN: 01/19/1933 (89 y.o. [name] [name] Primary Care [name] [name]: [name] Other Clinician: Referring [name] [name]: Treating Mada Sadik/Extender: Meredith Mody Weeks in Treatment: 0 Activities of Daily Living Items Answer Activities of Daily Living (Please select one for each item) Drive Automobile Not Able T Medications ake Need Assistance Use T elephone Need Assistance Care for Appearance Need Assistance Use T oilet Need Assistance Bath / Shower Need Assistance Dress Self Need Assistance Feed Self Completely Able Walk Need Assistance Get In / Out Bed Need Assistance Housework Not Able [name], [name] ([id number]) [phone] [email or web address] Page 2 of 5 Prepare Meals Need Assistance Handle Money Need  Assistance Shop for Self Need Assistance Electronic Signature(s) Signed: 11/10/2022 1:13:27 PM By: Angelina Pih Entered By: Angelina Pih on 11/10/2022 10:26:15 -------------------------------------------------------------------------------- Education Screening Details Patient Name: Date of Service: [name] Russell [name] 11/10/2022 10:00 A M Medical Record Number: [id number] Patient Account Number: [id number] Date of Birth/Sex: Treating RN: [birth year] (89 y.o. [name] [name] Primary Care [name] [name]: [name] Other Clinician: Referring [name] [name]: Treating Daruis Swaim/Extender: Meredith Mody Weeks in Treatment: 0 Learning Preferences/Education Level/Primary Language Learning Preference: Explanation, Demonstration, Video, Communication Board, Printed Material Preferred Language: English Cognitive Barrier Language Barrier: No Translator Needed: No Memory Deficit: No Emotional Barrier: No Cultural/Religious Beliefs Affecting Medical Care: No Physical Barrier Impaired Vision: No Impaired Hearing: No Decreased Hand dexterity: No Knowledge/Comprehension Knowledge Level: High Comprehension Level: High Ability to understand written instructions: High Ability to understand verbal instructions: High Motivation Anxiety Level: Calm Cooperation: Cooperative Education Importance: Acknowledges Need Interest in Health Problems: Asks Questions Perception: Coherent Willingness to Engage in Self-Management High Activities: Readiness to Engage in Self-Management High Activities: Electronic Signature(s) Signed: 11/10/2022 1:13:27 PM By: Angelina Pih Entered By: Angelina Pih on 11/10/2022 10:26:39 [name], [name] ([id number]) [phone] [id number] Page 3 of 5 -------------------------------------------------------------------------------- Fall Risk Assessment Details Patient Name: Date of Service: [name], [name] 11/10/2022 10:00 A  M Medical Record Number: [id number] Patient Account Number: [id number] Date of Birth/Sex: Treating RN: 1933-02-05 (89 y.o. [name] [name] Primary Care Mearl Harewood: [name] Other Clinician: Referring [name]: Treating Yifan Auker/Extender: Meredith Mody Weeks in Treatment: 0 Fall Risk Assessment Items Have you had 2 or more falls in the last 12 monthso 0 No Have you had any fall that resulted in injury in the last 12 monthso 0 No FALLS RISK SCREEN History of falling - immediate or within 3 months 0 No Secondary diagnosis (Do you have 2 or more medical diagnoseso) 0 No Ambulatory aid None/bed rest/wheelchair/nurse 0 No Crutches/cane/walker 15 Yes Furniture 0  No Intravenous therapy Access/Saline/Heparin Lock 0 No Gait/Transferring Normal/ bed rest/ wheelchair 0 No Weak (short steps with or without shuffle, stooped but able to lift head while walking, may seek 10 Yes support from furniture) Impaired (short steps with shuffle, may have difficulty arising from chair, head down, impaired 0 No balance) Mental Status Oriented to own ability 0 Yes Electronic Signature(s) Signed: 11/10/2022 1:13:27 PM By: Angelina Pih Entered By: Angelina Pih on 11/10/2022 10:27:31 -------------------------------------------------------------------------------- Foot Assessment Details Patient Name: Date of Service: [name] Russell [name] 11/10/2022 10:00 A M Medical Record Number: [id number] Patient Account Number: [id number] Date of Birth/Sex: Treating RN: 1932-06-08 (89 y.o. [name] [name] Primary Care Morning Halberg: [name] Other Clinician: Referring Dandra Shambaugh: Treating Owain Eckerman/Extender: Meredith Mody Weeks in Treatment: 0 Foot Assessment Items Site Locations [place], [place] C ([id number]) [id number] Page 4 of 5 + = Sensation present, - = Sensation absent, C = Callus, U = Ulcer R = Redness, W = Warmth, M = Maceration, PU =  Pre-ulcerative lesion F = Fissure, S = Swelling, D = Dryness Assessment Right: Left: Other Deformity: No No Prior Foot Ulcer: No No Prior Amputation: No No Charcot Joint: No No Ambulatory Status: Ambulatory With Help Assistance Device: Wheelchair Gait: Steady Electronic Signature(s) Signed: 11/10/2022 1:13:27 PM By: Angelina Pih Entered By: Angelina Pih on 11/10/2022 10:37:32 -------------------------------------------------------------------------------- Nutrition Risk Screening Details Patient Name: Date of Service: [name], [name] 11/10/2022 10:00 A M Medical Record Number: [id number] Patient Account Number: [id number] Date of Birth/Sex: Treating RN: [birth year] (89 y.o. [name] [name] Primary Care Jayci Ellefson: [name] Other Clinician: Referring Ineta Sinning: Treating Charell Faulk/Extender: Meredith Mody Weeks in Treatment: 0 Height (in): 66 Weight (lbs): 191 Body Mass Index (BMI): 30.8 Nutrition Risk Screening Items Score Screening NUTRITION RISK SCREEN: I have an illness or condition that made me change the kind and/or amount of food I eat 0 No I eat fewer than two meals per day 0 No I eat few fruits and vegetables, or milk products 0 No I have three or more drinks of beer, liquor or wine almost every day 0 No I have tooth or mouth problems that make it hard for me to eat 0 No [name], [name] ([id number]) [id number] Page 5 of 5 I don't always have enough money to buy the food I need 0 No I eat alone most of the time 0 No I take three or more different prescribed or over-the-counter drugs a day 0 No Without wanting to, I have lost or gained 10 pounds in the last six months 0 No I am not always physically able to shop, cook and/or feed myself 0 No Nutrition Protocols Good Risk Protocol 0 No interventions needed Moderate Risk Protocol High Risk Proctocol Risk Level: Good Risk Score: 0 Electronic  Signature(s) Signed: 11/10/2022 1:13:27 PM By: Angelina Pih Entered By: Angelina Pih on 11/10/2022 10:27:53

## 2022-11-10 NOTE — Progress Notes (Signed)
[name], [name] ([id number]) [id number] Page 1 of 11 Visit Report for 11/10/2022 Chief Complaint Document Details Patient Name: Date of Service: [name] [name], [name] [name] 11/10/2022 10:00 A M Medical Record Number: [id number] Patient Account Number: [id number] Date of Birth/Sex: Treating RN: 29-May-1932 (89 y.o. [name] [name] Primary Care Provider: [name] Other Clinician: Referring Provider: Treating Provider/Extender: Meredith Mody Weeks in Treatment: 0 Information Obtained from: Patient Chief Complaint Bilateral Gluteal Ulcers Electronic Signature(s) Signed: 11/10/2022 11:16:40 AM By: Allen Derry PA-C Entered By: Allen Derry on 11/10/2022 11:16:40 -------------------------------------------------------------------------------- Debridement Details Patient Name: Date of Service: [name] [name] [name] 11/10/2022 10:00 A M Medical Record Number: [id number] Patient Account Number: [id number] Date of Birth/Sex: Treating RN: [birth year] (89 y.o. [name] [name] Primary Care Provider: [name] Other Clinician: Referring Provider: Treating Provider/Extender: Meredith Mody Weeks in Treatment: 0 Debridement Performed for Assessment: Wound #1 Right,Medial Gluteus Performed By: Physician Allen Derry, PA-C Debridement Type: Debridement Level of Consciousness (Pre-procedure): Awake and Alert Pre-procedure Verification/Time Out Yes - 11:26 Taken: Pain Control: Lidocaine 4% T opical Solution Percent of Wound Bed Debrided: 5% T Area Debrided (cm): otal 1.44 Tissue and other material debrided: Viable, Non-Viable, Slough, Slough Level: Non-Viable Tissue Debridement Description: Selective/Open Wound Instrument: Forceps, Scissors Bleeding: None Response to Treatment: Procedure was tolerated well Level of Consciousness (Post- Awake and Alert procedure): Post Debridement Measurements of Total Wound [name] [name], [name] [name] ([id number])  [phone] Page 2 of 11 Length: (cm) 5.9 Width: (cm) 6.2 Depth: (cm) 2.3 Volume: (cm) 66.079 Character of Wound/Ulcer Post Debridement: Stable Post Procedure Diagnosis Same as Pre-procedure Electronic Signature(s) Signed: 11/10/2022 12:33:21 PM By: Allen Derry PA-C Signed: 11/10/2022 1:13:27 PM By: Angelina Pih Entered By: Angelina Pih on 11/10/2022 11:27:37 -------------------------------------------------------------------------------- HPI Details Patient Name: Date of Service: [name] [name] [name] 11/10/2022 10:00 A M Medical Record Number: [id number] Patient Account Number: [id number] Date of Birth/Sex: Treating RN: 30-Mar-1932 (89 y.o. [name] [name] Primary Care Provider: [name] Other Clinician: Referring Provider: Treating Provider/Extender: Meredith Mody Weeks in Treatment: 0 History of Present Illness HPI Description: 11-10-2022 upon evaluation today patient presents for initial inspection here in our clinic concerning a wound which occurred unfortunately as a result of an abscess. This was the main right gluteal/sacral region. With that being said she noted this first around or just before July 12 when she was admitted to the hospital from the 12th through the 26. The wound was at that point drained and subsequently she thought that she was headed in the right direction. With that being said she ended up having to be admitted to the hospital August and this was due to a CHF exacerbation with a took 25 pounds of fluid off of her. Subsequently she was then rehab for the wound at [facility] burn and the physician there apparently did an awesome job based on what I am seeing the wound actually looks like is doing quite well at this point. They were doing a fairly complex VAC with the small openings in multiple areas that were being backed as well at this point the good news is I do not think that he would need to be necessary based  on what I am seeing. She does have home health coming out to do the wound VAC changes Monday, Wednesday, and Friday although get this scheduled going has been somewhat a concern due to the fact that the patient is been in and out of the hospital. Patient does have a history of  [name], [name] ([id number]) [id number] Page 1 of 11 Visit Report for 11/10/2022 Chief Complaint Document Details Patient Name: Date of Service: [name] [name], [name] [name] 11/10/2022 10:00 A M Medical Record Number: [id number] Patient Account Number: [id number] Date of Birth/Sex: Treating RN: 29-May-1932 (89 y.o. [name] [name] Primary Care Provider: [name] Other Clinician: Referring Provider: Treating Provider/Extender: Meredith Mody Weeks in Treatment: 0 Information Obtained from: Patient Chief Complaint Bilateral Gluteal Ulcers Electronic Signature(s) Signed: 11/10/2022 11:16:40 AM By: Allen Derry PA-C Entered By: Allen Derry on 11/10/2022 11:16:40 -------------------------------------------------------------------------------- Debridement Details Patient Name: Date of Service: [name] [name] [name] 11/10/2022 10:00 A M Medical Record Number: [id number] Patient Account Number: [id number] Date of Birth/Sex: Treating RN: [birth year] (89 y.o. [name] [name] Primary Care Provider: [name] Other Clinician: Referring Provider: Treating Provider/Extender: Meredith Mody Weeks in Treatment: 0 Debridement Performed for Assessment: Wound #1 Right,Medial Gluteus Performed By: Physician Allen Derry, PA-C Debridement Type: Debridement Level of Consciousness (Pre-procedure): Awake and Alert Pre-procedure Verification/Time Out Yes - 11:26 Taken: Pain Control: Lidocaine 4% T opical Solution Percent of Wound Bed Debrided: 5% T Area Debrided (cm): otal 1.44 Tissue and other material debrided: Viable, Non-Viable, Slough, Slough Level: Non-Viable Tissue Debridement Description: Selective/Open Wound Instrument: Forceps, Scissors Bleeding: None Response to Treatment: Procedure was tolerated well Level of Consciousness (Post- Awake and Alert procedure): Post Debridement Measurements of Total Wound [name] [name], [name] [name] ([id number])  [phone] Page 2 of 11 Length: (cm) 5.9 Width: (cm) 6.2 Depth: (cm) 2.3 Volume: (cm) 66.079 Character of Wound/Ulcer Post Debridement: Stable Post Procedure Diagnosis Same as Pre-procedure Electronic Signature(s) Signed: 11/10/2022 12:33:21 PM By: Allen Derry PA-C Signed: 11/10/2022 1:13:27 PM By: Angelina Pih Entered By: Angelina Pih on 11/10/2022 11:27:37 -------------------------------------------------------------------------------- HPI Details Patient Name: Date of Service: [name] [name] [name] 11/10/2022 10:00 A M Medical Record Number: [id number] Patient Account Number: [id number] Date of Birth/Sex: Treating RN: 30-Mar-1932 (89 y.o. [name] [name] Primary Care Provider: [name] Other Clinician: Referring Provider: Treating Provider/Extender: Meredith Mody Weeks in Treatment: 0 History of Present Illness HPI Description: 11-10-2022 upon evaluation today patient presents for initial inspection here in our clinic concerning a wound which occurred unfortunately as a result of an abscess. This was the main right gluteal/sacral region. With that being said she noted this first around or just before July 12 when she was admitted to the hospital from the 12th through the 26. The wound was at that point drained and subsequently she thought that she was headed in the right direction. With that being said she ended up having to be admitted to the hospital August and this was due to a CHF exacerbation with a took 25 pounds of fluid off of her. Subsequently she was then rehab for the wound at [facility] burn and the physician there apparently did an awesome job based on what I am seeing the wound actually looks like is doing quite well at this point. They were doing a fairly complex VAC with the small openings in multiple areas that were being backed as well at this point the good news is I do not think that he would need to be necessary based  on what I am seeing. She does have home health coming out to do the wound VAC changes Monday, Wednesday, and Friday although get this scheduled going has been somewhat a concern due to the fact that the patient is been in and out of the hospital. Patient does have a history of  [name], [name] ([id number]) [id number] Page 1 of 11 Visit Report for 11/10/2022 Chief Complaint Document Details Patient Name: Date of Service: [name] [name], [name] [name] 11/10/2022 10:00 A M Medical Record Number: [id number] Patient Account Number: [id number] Date of Birth/Sex: Treating RN: 29-May-1932 (89 y.o. [name] [name] Primary Care Provider: [name] Other Clinician: Referring Provider: Treating Provider/Extender: Meredith Mody Weeks in Treatment: 0 Information Obtained from: Patient Chief Complaint Bilateral Gluteal Ulcers Electronic Signature(s) Signed: 11/10/2022 11:16:40 AM By: Allen Derry PA-C Entered By: Allen Derry on 11/10/2022 11:16:40 -------------------------------------------------------------------------------- Debridement Details Patient Name: Date of Service: [name] [name] [name] 11/10/2022 10:00 A M Medical Record Number: [id number] Patient Account Number: [id number] Date of Birth/Sex: Treating RN: [birth year] (89 y.o. [name] [name] Primary Care Provider: [name] Other Clinician: Referring Provider: Treating Provider/Extender: Meredith Mody Weeks in Treatment: 0 Debridement Performed for Assessment: Wound #1 Right,Medial Gluteus Performed By: Physician Allen Derry, PA-C Debridement Type: Debridement Level of Consciousness (Pre-procedure): Awake and Alert Pre-procedure Verification/Time Out Yes - 11:26 Taken: Pain Control: Lidocaine 4% T opical Solution Percent of Wound Bed Debrided: 5% T Area Debrided (cm): otal 1.44 Tissue and other material debrided: Viable, Non-Viable, Slough, Slough Level: Non-Viable Tissue Debridement Description: Selective/Open Wound Instrument: Forceps, Scissors Bleeding: None Response to Treatment: Procedure was tolerated well Level of Consciousness (Post- Awake and Alert procedure): Post Debridement Measurements of Total Wound [name] [name], [name] [name] ([id number])  [phone].pdf Page 2 of 11 Length: (cm) 5.9 Width: (cm) 6.2 Depth: (cm) 2.3 Volume: (cm) 66.079 Character of Wound/Ulcer Post Debridement: Stable Post Procedure Diagnosis Same as Pre-procedure Electronic Signature(s) Signed: 11/10/2022 12:33:21 PM By: Allen Derry PA-C Signed: 11/10/2022 1:13:27 PM By: Angelina Pih Entered By: Angelina Pih on 11/10/2022 11:27:37 -------------------------------------------------------------------------------- HPI Details Patient Name: Date of Service: [name] [name] [name] 11/10/2022 10:00 A M Medical Record Number: [id number] Patient Account Number: [id number] Date of Birth/Sex: Treating RN: 30-Mar-1932 (89 y.o. [name] [name] Primary Care Provider: [name] Other Clinician: Referring Provider: Treating Provider/Extender: Meredith Mody Weeks in Treatment: 0 History of Present Illness HPI Description: 11-10-2022 upon evaluation today patient presents for initial inspection here in our clinic concerning a wound which occurred unfortunately as a result of an abscess. This was the main right gluteal/sacral region. With that being said she noted this first around or just before July 12 when she was admitted to the hospital from the 12th through the 26. The wound was at that point drained and subsequently she thought that she was headed in the right direction. With that being said she ended up having to be admitted to the hospital August and this was due to a CHF exacerbation with a took 25 pounds of fluid off of her. Subsequently she was then rehab for the wound at [facility] burn and the physician there apparently did an awesome job based on what I am seeing the wound actually looks like is doing quite well at this point. They were doing a fairly complex VAC with the small openings in multiple areas that were being backed as well at this point the good news is I do not think that he would need to be necessary based  on what I am seeing. She does have home health coming out to do the wound VAC changes Monday, Wednesday, and Friday although get this scheduled going has been somewhat a concern due to the fact that the patient is been in and out of the hospital. Patient does have a history of  and Symptoms: Positive for: Swelling Musculoskeletal Complaints and Symptoms: Positive for: Muscle Weakness - right lower leg Medical HistoryMarland Kitchen TEOSHA, ADI ([id number]) [phone].pdf Page 10 of 11 Positive for: Osteoarthritis - right knee Psychiatric Complaints and Symptoms: Positive for: Anxiety - since stroke Negative for: Claustrophobia Eyes Medical History: Positive for: Cataracts - removed Hematologic/Lymphatic Medical History: Positive for: Anemia Respiratory Medical History: Positive for: Asthma Cardiovascular Medical History: Positive for: Congestive Heart Failure; Hypertension Endocrine Medical History: Positive for: Type II Diabetes Time with diabetes: 52 diagnosed Treated with: Insulin Blood sugar tested every day: Yes Tested : 4 times daily Blood sugar testing results: Breakfast: 260 Genitourinary Complaints and Symptoms: Review of System Notes: chronic kidney disease stage 3 Neurologic Complaints and Symptoms: Review of System Notes: numbness in bilat feet , stroke in 2021 Oncologic HBO Extended History  Items Eyes: Cataracts Immunizations Pneumococcal Vaccine: Received Pneumococcal Vaccination: Yes Received Pneumococcal Vaccination On or After 60th Birthday: Yes Implantable Devices None Family and Social History Never smoker; Alcohol Use: Never; Drug Use: No History; Caffeine Use: Daily - coffee daily Electronic Signature(s) Signed: 11/10/2022 12:33:21 PM By: Allen Derry PA-C Signed: 11/10/2022 1:13:27 PM By: Angelina Pih Entered By: Angelina Pih on 11/10/2022 10:25:24 [name] [name], [name] [name] ([id number]) [id number] Page 11 of 11 -------------------------------------------------------------------------------- SuperBill Details Patient Name: Date of Service: [name] [name], [name] [name] 11/10/2022 Medical Record Number: [id number] Patient Account Number: [id number] Date of Birth/Sex: Treating RN: 1932-06-11 (89 y.o. 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 E11.622 Type 2 diabetes mellitus with other skin ulcer I10 Essential (primary) hypertension I50.42 Chronic combined systolic (congestive) and diastolic (congestive) heart failure N18.30 Chronic kidney disease, stage 3 unspecified Facility Procedures : CPT4 Code: 14431540 Description: 99213 - WOUND CARE VISIT-LEV 3 EST PT Modifier: Quantity: 1 : CPT4 Code: 08676195 Description: 97597 - DEBRIDE WOUND 1ST 20 SQ CM OR < ICD-10 Diagnosis Description 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 of Modifier: necrosis Quantity: 1 Physician Procedures : CPT4  Code Description Modifier 0932671 99204 - WC PHYS LEVEL 4 - NEW PT 25 ICD-10 Diagnosis Description T81.31XA Disruption of external operation (surgical) wound, not elsewhere classified, initial encounter L02.31 Cutaneous abscess of buttock L98.415  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 [name] [name] Primary Care Provider: [name] Other Clinician: Referring Provider: Treating Provider/Extender: Meredith Mody Weeks in Treatment: 0 Information Obtained from: Patient Chief Complaint Bilateral Gluteal Ulcers Electronic Signature(s) Signed: 11/10/2022 11:16:40 AM By: Allen Derry PA-C Entered By: Allen Derry on 11/10/2022 11:16:40 -------------------------------------------------------------------------------- Debridement Details Patient Name: Date of Service: [name] [name] [name] 11/10/2022 10:00 A M Medical Record Number: [id number] Patient Account Number: [id number] Date of Birth/Sex: Treating RN: [birth year] (89 y.o. [name] [name] Primary Care Provider: [name] Other Clinician: Referring Provider: Treating Provider/Extender: Meredith Mody Weeks in Treatment: 0 Debridement Performed for Assessment: Wound #1 Right,Medial Gluteus Performed By: Physician Allen Derry, PA-C Debridement Type: Debridement Level of Consciousness (Pre-procedure): Awake and Alert Pre-procedure Verification/Time Out Yes - 11:26 Taken: Pain Control: Lidocaine 4% T opical Solution Percent of Wound Bed Debrided: 5% T Area Debrided (cm): otal 1.44 Tissue and other material debrided: Viable, Non-Viable, Slough, Slough Level: Non-Viable Tissue Debridement Description: Selective/Open Wound Instrument: Forceps, Scissors Bleeding: None Response to Treatment: Procedure was tolerated well Level of Consciousness (Post- Awake and Alert procedure): Post Debridement Measurements of Total Wound [name] [name], [name] [name] ([id number])  [phone].pdf Page 2 of 11 Length: (cm) 5.9 Width: (cm) 6.2 Depth: (cm) 2.3 Volume: (cm) 66.079 Character of Wound/Ulcer Post Debridement: Stable Post Procedure Diagnosis Same as Pre-procedure Electronic Signature(s) Signed: 11/10/2022 12:33:21 PM By: Allen Derry PA-C Signed: 11/10/2022 1:13:27 PM By: Angelina Pih Entered By: Angelina Pih on 11/10/2022 11:27:37 -------------------------------------------------------------------------------- HPI Details Patient Name: Date of Service: [name] [name] [name] 11/10/2022 10:00 A M Medical Record Number: [id number] Patient Account Number: [id number] Date of Birth/Sex: Treating RN: 30-Mar-1932 (89 y.o. [name] [name] Primary Care Provider: [name] Other Clinician: Referring Provider: Treating Provider/Extender: Meredith Mody Weeks in Treatment: 0 History of Present Illness HPI Description: 11-10-2022 upon evaluation today patient presents for initial inspection here in our clinic concerning a wound which occurred unfortunately as a result of an abscess. This was the main right gluteal/sacral region. With that being said she noted this first around or just before July 12 when she was admitted to the hospital from the 12th through the 26. The wound was at that point drained and subsequently she thought that she was headed in the right direction. With that being said she ended up having to be admitted to the hospital August and this was due to a CHF exacerbation with a took 25 pounds of fluid off of her. Subsequently she was then rehab for the wound at [facility] burn and the physician there apparently did an awesome job based on what I am seeing the wound actually looks like is doing quite well at this point. They were doing a fairly complex VAC with the small openings in multiple areas that were being backed as well at this point the good news is I do not think that he would need to be necessary based  on what I am seeing. She does have home health coming out to do the wound VAC changes Monday, Wednesday, and Friday although get this scheduled going has been somewhat a concern due to the fact that the patient is been in and out of the hospital. Patient does have a history of  and Symptoms: Positive for: Swelling Musculoskeletal Complaints and Symptoms: Positive for: Muscle Weakness - right lower leg Medical HistoryMarland Kitchen TEOSHA, ADI ([id number]) [phone].pdf Page 10 of 11 Positive for: Osteoarthritis - right knee Psychiatric Complaints and Symptoms: Positive for: Anxiety - since stroke Negative for: Claustrophobia Eyes Medical History: Positive for: Cataracts - removed Hematologic/Lymphatic Medical History: Positive for: Anemia Respiratory Medical History: Positive for: Asthma Cardiovascular Medical History: Positive for: Congestive Heart Failure; Hypertension Endocrine Medical History: Positive for: Type II Diabetes Time with diabetes: 50 diagnosed Treated with: Insulin Blood sugar tested every day: Yes Tested : 4 times daily Blood sugar testing results: Breakfast: 260 Genitourinary Complaints and Symptoms: Review of System Notes: chronic kidney disease stage 3 Neurologic Complaints and Symptoms: Review of System Notes: numbness in bilat feet , stroke in 2021 Oncologic HBO Extended History  Items Eyes: Cataracts Immunizations Pneumococcal Vaccine: Received Pneumococcal Vaccination: Yes Received Pneumococcal Vaccination On or After 60th Birthday: Yes Implantable Devices None Family and Social History Never smoker; Alcohol Use: Never; Drug Use: No History; Caffeine Use: Daily - coffee daily Electronic Signature(s) Signed: 11/10/2022 12:33:21 PM By: Allen Derry PA-C Signed: 11/10/2022 1:13:27 PM By: Angelina Pih Entered By: Angelina Pih on 11/10/2022 10:25:24 [name] [name], [name] [name] ([id number]) [id number] Page 11 of 11 -------------------------------------------------------------------------------- SuperBill Details Patient Name: Date of Service: [name] [name], [name] [name] 11/10/2022 Medical Record Number: [id number] Patient Account Number: [id number] Date of Birth/Sex: Treating RN: 1932-06-11 (89 y.o. 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 E11.622 Type 2 diabetes mellitus with other skin ulcer I10 Essential (primary) hypertension I50.42 Chronic combined systolic (congestive) and diastolic (congestive) heart failure N18.30 Chronic kidney disease, stage 3 unspecified Facility Procedures : CPT4 Code: [id number] Description: 99213 - WOUND CARE VISIT-LEV 3 EST PT Modifier: Quantity: 1 : CPT4 Code: [id number] Description: 97597 - DEBRIDE WOUND 1ST 20 SQ CM OR < ICD-10 Diagnosis Description 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 of Modifier: necrosis Quantity: 1 Physician Procedures : CPT4  Code Description Modifier [id number] 99204 - WC PHYS LEVEL 4 - NEW PT 25 ICD-10 Diagnosis Description T81.31XA Disruption of external operation (surgical) wound, not elsewhere classified, initial encounter L02.31 Cutaneous abscess of buttock L98.415  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 Remove old dressing, discard into plastic bag and place into trash. Cleanse the wound with Wound Cleanser prior to applying a clean dressing using gauze sponges, not tissues or cotton balls. Do not scrub or use excessive force. Pat dry using gauze sponges, not tissue or cotton balls. Prim Dressing: Silvercel Small 2x2 (in/in) ary 3 x Per Week/15 Days NICLE, MILKOWSKI ([id number]) [phone].pdf Page 5 of 11 Discharge Instructions: Apply Silvercel Small 2x2 (in/in) as instructed Secondary Dressing: tegaderm 3 x Per Week/15 Days Electronic Signature(s) Signed: 11/10/2022 1:13:27 PM By: Angelina Pih Signed: 11/10/2022 1:20:03 PM By: Allen Derry PA-C Previous Signature: 11/10/2022 12:08:36 PM Version By: Angelina Pih Previous Signature: 11/10/2022 12:33:21 PM Version By: Allen Derry PA-C Entered By: Angelina Pih on 11/10/2022 12:34:29 -------------------------------------------------------------------------------- Problem List Details Patient Name: Date of Service: [name] [name] [name] 11/10/2022 10:00 A M Medical Record Number: [id number] Patient Account Number: [id number] Date of Birth/Sex: Treating RN: 04/25/1932 (89 y.o. 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 without 11/10/2022 No Yes evidence of necrosis E11.622 Type 2 diabetes mellitus with other skin ulcer 11/10/2022 No Yes I10 Essential (primary) hypertension 11/10/2022 No Yes I50.42 Chronic combined systolic (congestive) and diastolic (congestive) heart failure 11/10/2022 No Yes N18.30 Chronic kidney disease, stage 3 unspecified 11/10/2022 No Yes Inactive Problems Resolved Problems ARYIA, RINI ([id number]) [phone].pdf Page 6 of 11 Electronic Signature(s) Signed: 11/10/2022 11:14:48 AM By: Allen Derry PA-C Entered By: Allen Derry on 11/10/2022 11:14:48 -------------------------------------------------------------------------------- Progress Note Details Patient Name: Date of Service: ELISSE, FRAPPIER [name] 11/10/2022 10:00 A M Medical Record Number: [id number] Patient Account Number: [id number] Date of Birth/Sex: Treating RN: 08-Jan-1933 (89 y.o. [name] [name] Primary Care Provider: [name] Other Clinician: Referring Provider: Treating Provider/Extender: Meredith Mody Weeks in Treatment: 0 Subjective Chief Complaint Information obtained from Patient Bilateral Gluteal Ulcers History of Present Illness (HPI) 11-10-2022 upon evaluation today patient presents for initial inspection here in our clinic concerning a wound which occurred unfortunately as a result of an abscess. This was the main right gluteal/sacral region. With that being said she noted this  first around or just before July 12 when she was admitted to the hospital from the 12th through the 26. The wound was at that point drained and subsequently she thought that she was headed in the right direction. With that being said she ended up having to be admitted to the hospital August and this was due to a CHF exacerbation with a took 25 pounds of fluid off of her. Subsequently she was then rehab for the wound at [facility] burn and the physician there apparently did an awesome job based on what I am seeing the wound actually looks like is doing quite well at this point. They were doing a fairly complex VAC with the small openings in multiple areas that were being backed as well at this point the good news is I do not think that he would need to be necessary based on  and Symptoms: Positive for: Swelling Musculoskeletal Complaints and Symptoms: Positive for: Muscle Weakness - right lower leg Medical HistoryMarland Kitchen TEOSHA, ADI ([id number]) [phone].pdf Page 10 of 11 Positive for: Osteoarthritis - right knee Psychiatric Complaints and Symptoms: Positive for: Anxiety - since stroke Negative for: Claustrophobia Eyes Medical History: Positive for: Cataracts - removed Hematologic/Lymphatic Medical History: Positive for: Anemia Respiratory Medical History: Positive for: Asthma Cardiovascular Medical History: Positive for: Congestive Heart Failure; Hypertension Endocrine Medical History: Positive for: Type II Diabetes Time with diabetes: 52 diagnosed Treated with: Insulin Blood sugar tested every day: Yes Tested : 4 times daily Blood sugar testing results: Breakfast: 260 Genitourinary Complaints and Symptoms: Review of System Notes: chronic kidney disease stage 3 Neurologic Complaints and Symptoms: Review of System Notes: numbness in bilat feet , stroke in 2021 Oncologic HBO Extended History  Items Eyes: Cataracts Immunizations Pneumococcal Vaccine: Received Pneumococcal Vaccination: Yes Received Pneumococcal Vaccination On or After 60th Birthday: Yes Implantable Devices None Family and Social History Never smoker; Alcohol Use: Never; Drug Use: No History; Caffeine Use: Daily - coffee daily Electronic Signature(s) Signed: 11/10/2022 12:33:21 PM By: Allen Derry PA-C Signed: 11/10/2022 1:13:27 PM By: Angelina Pih Entered By: Angelina Pih on 11/10/2022 10:25:24 [name] [name], [name] [name] ([id number]) [id number] Page 11 of 11 -------------------------------------------------------------------------------- SuperBill Details Patient Name: Date of Service: [name] [name], [name] [name] 11/10/2022 Medical Record Number: [id number] Patient Account Number: [id number] Date of Birth/Sex: Treating RN: 1932-06-11 (89 y.o. 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 E11.622 Type 2 diabetes mellitus with other skin ulcer I10 Essential (primary) hypertension I50.42 Chronic combined systolic (congestive) and diastolic (congestive) heart failure N18.30 Chronic kidney disease, stage 3 unspecified Facility Procedures : CPT4 Code: 14431540 Description: 99213 - WOUND CARE VISIT-LEV 3 EST PT Modifier: Quantity: 1 : CPT4 Code: 08676195 Description: 97597 - DEBRIDE WOUND 1ST 20 SQ CM OR < ICD-10 Diagnosis Description 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 of Modifier: necrosis Quantity: 1 Physician Procedures : CPT4  Code Description Modifier [id number] 99204 - WC PHYS LEVEL 4 - NEW PT 25 ICD-10 Diagnosis Description T81.31XA Disruption of external operation (surgical) wound, not elsewhere classified, initial encounter L02.31 Cutaneous abscess of buttock L98.415  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  and Symptoms: Positive for: Swelling Musculoskeletal Complaints and Symptoms: Positive for: Muscle Weakness - right lower leg Medical HistoryMarland Kitchen TEOSHA, ADI ([id number]) [phone].pdf Page 10 of 11 Positive for: Osteoarthritis - right knee Psychiatric Complaints and Symptoms: Positive for: Anxiety - since stroke Negative for: Claustrophobia Eyes Medical History: Positive for: Cataracts - removed Hematologic/Lymphatic Medical History: Positive for: Anemia Respiratory Medical History: Positive for: Asthma Cardiovascular Medical History: Positive for: Congestive Heart Failure; Hypertension Endocrine Medical History: Positive for: Type II Diabetes Time with diabetes: 52 diagnosed Treated with: Insulin Blood sugar tested every day: Yes Tested : 4 times daily Blood sugar testing results: Breakfast: 260 Genitourinary Complaints and Symptoms: Review of System Notes: chronic kidney disease stage 3 Neurologic Complaints and Symptoms: Review of System Notes: numbness in bilat feet , stroke in 2021 Oncologic HBO Extended History  Items Eyes: Cataracts Immunizations Pneumococcal Vaccine: Received Pneumococcal Vaccination: Yes Received Pneumococcal Vaccination On or After 60th Birthday: Yes Implantable Devices None Family and Social History Never smoker; Alcohol Use: Never; Drug Use: No History; Caffeine Use: Daily - coffee daily Electronic Signature(s) Signed: 11/10/2022 12:33:21 PM By: Allen Derry PA-C Signed: 11/10/2022 1:13:27 PM By: Angelina Pih Entered By: Angelina Pih on 11/10/2022 10:25:24 [name] [name], [name] [name] ([id number]) [id number] Page 11 of 11 -------------------------------------------------------------------------------- SuperBill Details Patient Name: Date of Service: [name] [name], [name] [name] 11/10/2022 Medical Record Number: [id number] Patient Account Number: [id number] Date of Birth/Sex: Treating RN: 1932-06-11 (89 y.o. 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 E11.622 Type 2 diabetes mellitus with other skin ulcer I10 Essential (primary) hypertension I50.42 Chronic combined systolic (congestive) and diastolic (congestive) heart failure N18.30 Chronic kidney disease, stage 3 unspecified Facility Procedures : CPT4 Code: [id number] Description: 99213 - WOUND CARE VISIT-LEV 3 EST PT Modifier: Quantity: 1 : CPT4 Code: [id number] Description: 97597 - DEBRIDE WOUND 1ST 20 SQ CM OR < ICD-10 Diagnosis Description 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 of Modifier: necrosis Quantity: 1 Physician Procedures : CPT4  Code Description Modifier [id number] 99204 - WC PHYS LEVEL 4 - NEW PT 25 ICD-10 Diagnosis Description T81.31XA Disruption of external operation (surgical) wound, not elsewhere classified, initial encounter L02.31 Cutaneous abscess of buttock L98.415  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

## 2022-11-10 NOTE — Progress Notes (Addendum)
in Treatment: 0 Wound Status Wound Number: 1 Primary Abscess Etiology: Wound Location: Right, Medial Gluteus Wound Open Wounding Event: Other Lesion Status: Date Acquired: 08/28/2022 Comorbid Cataracts, Anemia, Asthma, Congestive Heart Failure, Weeks Of Treatment: 0 History: Hypertension, Type II Diabetes, Osteoarthritis Clustered Wound: No Photos [name] [name], [name] [name] ([id number]) [id number] Page 10 of 14 Wound Measurements Length: (cm) 5.9 Width: (cm) 6.2 Depth: (cm) 2.3 Area: (cm) 28.73 Volume: (cm) 66.079 % Reduction in Area: % Reduction in Volume: Epithelialization: None Tunneling: Yes Location 1 Position (o'clock): 5 Maximum Distance: (cm) 3.6 Location 2 Position (o'clock): 10 Maximum Distance: (cm) 2.1 Undermining: No Wound Description Classification: Full Thickness Without Exposed  Suppor Exudate Amount: Large Exudate Type: Serosanguineous Exudate Color: red, brown t Structures Foul Odor After Cleansing: No Slough/Fibrino Yes Wound Bed Granulation Amount: Large (67-100%) Exposed Structure Granulation Quality: Red Fat Layer (Subcutaneous Tissue) Exposed: Yes Necrotic Amount: Small (1-33%) Necrotic Quality: Adherent Scientist, physiological) Signed: 11/10/2022 1:13:27 PM By: Angelina Pih Entered By: Angelina Pih on 11/10/2022 10:58:14 -------------------------------------------------------------------------------- Wound Assessment Details Patient Name: Date of Service: [name] [name], [name] [name] 11/10/2022 10:00 A M Medical Record Number: [id number] Patient Account Number: [id number] Date of Birth/Sex: Treating RN: 1933/02/02 (89 y.o. [name] [name] Primary Care [name] [name]: [name] [name] Other Clinician: Referring [name] [name]: Treating [name] [name]/Extender: [name] [name] Weeks in Treatment: 0 Wound Status Wound Number: 2 Primary Open Surgical Wound Etiology: Wound Location: Right, Lateral Gluteus Wound Open Wounding Event: Surgical Injury Status: Date Acquired: 09/14/2022 Comorbid Cataracts, Anemia, Asthma, Congestive Heart Failure, Weeks Of Treatment: 0 History: Hypertension, Type II Diabetes, Osteoarthritis Clustered Wound: No [name] [name] ([id number]) [id number] Page 11 of 14 Photos Wound Measurements Length: (cm) 0.3 Width: (cm) 0.7 Depth: (cm) 0.1 Area: (cm) 0.165 Volume: (cm) 0.016 % Reduction in Area: % Reduction in Volume: Epithelialization: None Tunneling: No Undermining: No Wound Description Classification: Full Thickness Without Exposed Suppor Exudate Amount: Medium Exudate Type: Serosanguineous Exudate Color: red, brown t Structures Foul Odor After Cleansing: No Slough/Fibrino No Wound Bed Granulation Amount: Large (67-100%) Exposed Structure Granulation Quality: Red Fat Layer  (Subcutaneous Tissue) Exposed: Yes Necrotic Amount: None Present (0%) Electronic Signature(s) Signed: 11/10/2022 1:13:27 PM By: Angelina Pih Entered By: Angelina Pih on 11/10/2022 11:00:03 -------------------------------------------------------------------------------- Wound Assessment Details Patient Name: Date of Service: [name] [name], [name] [name] 11/10/2022 10:00 A M Medical Record Number: [id number] Patient Account Number: [id number] Date of Birth/Sex: Treating RN: Sep 21, 1932 (89 y.o. [name] [name] Primary Care [name] [name]: [name] [name] Other Clinician: Referring [name] [name]: Treating [name] [name]/Extender: [name] [name] Weeks in Treatment: 0 Wound Status Wound Number: 3 Primary Open Surgical Wound Etiology: Wound Location: Right, Distal Back Wound Open Wounding Event: Other Lesion Status: Date Acquired: 09/14/2022 Comorbid Cataracts, Anemia, Asthma, Congestive Heart Failure, Weeks Of Treatment: 0 History: Hypertension, Type II Diabetes, Osteoarthritis Clustered Wound: No Photos [name] [name], [name] [name] ([id number]) [id number] Page 12 of 14 Wound Measurements Length: (cm) 0.2 Width: (cm) 1.3 Depth: (cm) 0.7 Area: (cm) 0.204 Volume: (cm) 0.143 % Reduction in Area: % Reduction in Volume: Epithelialization: None Tunneling: No Undermining: No Wound Description Classification: Full Thickness Without Exposed Suppor Exudate Amount: Medium Exudate Type: Serosanguineous Exudate Color: red, brown t Structures Foul Odor After Cleansing: No Slough/Fibrino No Wound Bed Granulation Amount: Large (67-100%) Exposed Structure Granulation Quality: Red Fat Layer (Subcutaneous Tissue) Exposed: Yes Necrotic Amount: None Present (0%) Electronic Signature(s) Signed: 11/10/2022 1:13:27 PM By: Angelina Pih Entered By: Angelina Pih on 11/10/2022 11:01:08 -------------------------------------------------------------------------------- Wound Assessment  Details Patient Name: Date of Service: [name] [name], [name] [name]  and as needed Provide education on pressure ulcers Notes: Wound/Skin Impairment Nursing Diagnoses: Impaired tissue integrity Knowledge deficit related to ulceration/compromised skin integrity Goals: Ulcer/skin breakdown will have a volume reduction of 30% by week 4 Date Initiated: 11/10/2022 Target Resolution Date: 12/08/2022 Goal Status: Active Ulcer/skin breakdown will have a volume reduction of 50% by week 8 Date Initiated: 11/10/2022 Target Resolution Date: 01/05/2023 Goal Status: Active Ulcer/skin breakdown will have a volume reduction of 80% by week 12 Date Initiated: 11/10/2022 Target Resolution Date: 02/02/2023 Goal Status: Active Ulcer/skin breakdown will heal within 14 weeks Date Initiated: 11/10/2022 Target Resolution Date: 02/16/2023 Goal Status: Active Interventions: Assess patient/caregiver ability to obtain necessary supplies Assess patient/caregiver ability to perform ulcer/skin care regimen upon admission and as needed Assess ulceration(s) every visit Provide education on ulcer and skin care Notes: Negative Pressure Wound Therapy Nursing Diagnoses: Potential for imbalanced nutrition Impaired tissue integrity Goals: Patient/caregiver agrees to and verbalizes understanding of need to use Date Initiated: 11/10/2022 Date Inactivated: 11/10/2022 Target Resolution Date: 11/10/2022 Goal Status: Met Promote granulation tissue Date Initiated: 11/10/2022 Target Resolution Date: 01/05/2023 Goal Status: Active Nutritional supplements and/or vitamins as prescribed Date Initiated: 11/10/2022 Target Resolution Date: 01/05/2023 Goal Status: Active Interventions: Assess drainage (amount and color) Assess patient nutrition upon admission and as needed per policy Assess patient understanding of disease process and management Assess patient/caregiver ability to obtain necessary supplies Assess wound bed for efficacy of  treatment Monitor and protect skin around the wound Provide education on nutrition Notes: Electronic Signature(s) Signed: 11/10/2022 12:38:47 PM By: Angelina Pih Entered By: Angelina Pih on 11/10/2022 12:38:46 Mikael Spray ([id number]) [id number] Page 8 of 14 -------------------------------------------------------------------------------- Pain Assessment Details Patient Name: Date of Service: AIDYNN, POPOLIZIO 11/10/2022 10:00 A M Medical Record Number: [id number] Patient Account Number: [id number] Date of Birth/Sex: Treating RN: [birth year] (89 y.o. [name] [name] Primary Care Shirlena Brinegar: [name] [name] Other Clinician: Referring Sahana Boyland: Treating Luree Palla/Extender: [name] [name] Weeks in Treatment: 0 Active Problems Location of Pain Severity and Description of Pain Patient Has Paino No Site Locations Rate the pain. Current Pain Level: 0 Pain Management and Medication Current Pain Management: Electronic Signature(s) Signed: 11/10/2022 1:13:27 PM By: Angelina Pih Entered By: Angelina Pih on 11/10/2022 10:18:15 -------------------------------------------------------------------------------- Patient/Caregiver Education Details Patient Name: Date of Service: [name] [name] 9/13/2024andnbsp10:00 A M Medical Record Number: [id number] Patient Account Number: [id number] Date of Birth/Gender: Treating RN: 1933-02-05 (89 y.o. [name] [name] Primary Care Physician: [name] [name] Other Clinician: Referring Physician: Treating Physician/Extender: [name] [name] Weeks in Treatment: [address] [place], SHULTIS ([id number]) [id number] Page 9 of 14 Education Assessment Education Provided To: Patient and Caregiver Education Topics Provided Nutrition: Handouts: Nutrition Methods: Explain/Verbal Responses: State content correctly Pressure: Handouts: Pressure Injury: Prevention and  Offloading Methods: Explain/Verbal Responses: State content correctly Welcome [facility] Packet: o Handouts: [facility] P[facility] form, [facility] o Methods: Explain/Verbal Responses: State content correctly Wound Debridement: Handouts: Wound Debridement Methods: Explain/Verbal Responses: State content correctly Wound/Skin Impairment: Handouts: Caring for Your Ulcer Methods: Explain/Verbal Responses: State content correctly Electronic Signature(s) Signed: 11/10/2022 1:13:27 PM By: Angelina Pih Entered By: Angelina Pih on 11/10/2022 12:39:50 -------------------------------------------------------------------------------- Wound Assessment Details Patient Name: Date of Service: [name] [name], [name] [name] 11/10/2022 10:00 A M Medical Record Number: [id number] Patient Account Number: [id number] Date of Birth/Sex: Treating RN: 03-11-1932 (89 y.o. [name] [name] Primary Care Zayra Devito: [name] [name] Other Clinician: Referring Yanni Quiroa: Treating Laquonda Welby/Extender: [name] [name] Weeks  and as needed Provide education on pressure ulcers Notes: Wound/Skin Impairment Nursing Diagnoses: Impaired tissue integrity Knowledge deficit related to ulceration/compromised skin integrity Goals: Ulcer/skin breakdown will have a volume reduction of 30% by week 4 Date Initiated: 11/10/2022 Target Resolution Date: 12/08/2022 Goal Status: Active Ulcer/skin breakdown will have a volume reduction of 50% by week 8 Date Initiated: 11/10/2022 Target Resolution Date: 01/05/2023 Goal Status: Active Ulcer/skin breakdown will have a volume reduction of 80% by week 12 Date Initiated: 11/10/2022 Target Resolution Date: 02/02/2023 Goal Status: Active Ulcer/skin breakdown will heal within 14 weeks Date Initiated: 11/10/2022 Target Resolution Date: 02/16/2023 Goal Status: Active Interventions: Assess patient/caregiver ability to obtain necessary supplies Assess patient/caregiver ability to perform ulcer/skin care regimen upon admission and as needed Assess ulceration(s) every visit Provide education on ulcer and skin care Notes: Negative Pressure Wound Therapy Nursing Diagnoses: Potential for imbalanced nutrition Impaired tissue integrity Goals: Patient/caregiver agrees to and verbalizes understanding of need to use Date Initiated: 11/10/2022 Date Inactivated: 11/10/2022 Target Resolution Date: 11/10/2022 Goal Status: Met Promote granulation tissue Date Initiated: 11/10/2022 Target Resolution Date: 01/05/2023 Goal Status: Active Nutritional supplements and/or vitamins as prescribed Date Initiated: 11/10/2022 Target Resolution Date: 01/05/2023 Goal Status: Active Interventions: Assess drainage (amount and color) Assess patient nutrition upon admission and as needed per policy Assess patient understanding of disease process and management Assess patient/caregiver ability to obtain necessary supplies Assess wound bed for efficacy of  treatment Monitor and protect skin around the wound Provide education on nutrition Notes: Electronic Signature(s) Signed: 11/10/2022 12:38:47 PM By: Angelina Pih Entered By: Angelina Pih on 11/10/2022 12:38:46 Mikael Spray ([id number]) [id number] Page 8 of 14 -------------------------------------------------------------------------------- Pain Assessment Details Patient Name: Date of Service: AIDYNN, POPOLIZIO 11/10/2022 10:00 A M Medical Record Number: [id number] Patient Account Number: [id number] Date of Birth/Sex: Treating RN: [birth year] (89 y.o. [name] [name] Primary Care Shirlena Brinegar: [name] [name] Other Clinician: Referring Sahana Boyland: Treating Luree Palla/Extender: [name] [name] Weeks in Treatment: 0 Active Problems Location of Pain Severity and Description of Pain Patient Has Paino No Site Locations Rate the pain. Current Pain Level: 0 Pain Management and Medication Current Pain Management: Electronic Signature(s) Signed: 11/10/2022 1:13:27 PM By: Angelina Pih Entered By: Angelina Pih on 11/10/2022 10:18:15 -------------------------------------------------------------------------------- Patient/Caregiver Education Details Patient Name: Date of Service: [name] [name] 9/13/2024andnbsp10:00 A M Medical Record Number: [id number] Patient Account Number: [id number] Date of Birth/Gender: Treating RN: 1933-02-05 (89 y.o. [name] [name] Primary Care Physician: [name] [name] Other Clinician: Referring Physician: Treating Physician/Extender: [name] [name] Weeks in Treatment: [address] [place], SHULTIS ([id number]) [id number] Page 9 of 14 Education Assessment Education Provided To: Patient and Caregiver Education Topics Provided Nutrition: Handouts: Nutrition Methods: Explain/Verbal Responses: State content correctly Pressure: Handouts: Pressure Injury: Prevention and  Offloading Methods: Explain/Verbal Responses: State content correctly Welcome [facility] Packet: o Handouts: [facility] P[facility] form, [facility] o Methods: Explain/Verbal Responses: State content correctly Wound Debridement: Handouts: Wound Debridement Methods: Explain/Verbal Responses: State content correctly Wound/Skin Impairment: Handouts: Caring for Your Ulcer Methods: Explain/Verbal Responses: State content correctly Electronic Signature(s) Signed: 11/10/2022 1:13:27 PM By: Angelina Pih Entered By: Angelina Pih on 11/10/2022 12:39:50 -------------------------------------------------------------------------------- Wound Assessment Details Patient Name: Date of Service: [name] [name], [name] [name] 11/10/2022 10:00 A M Medical Record Number: [id number] Patient Account Number: [id number] Date of Birth/Sex: Treating RN: 03-11-1932 (89 y.o. [name] [name] Primary Care Zayra Devito: [name] [name] Other Clinician: Referring Yanni Quiroa: Treating Laquonda Welby/Extender: [name] [name] Weeks  in Treatment: 0 Wound Status Wound Number: 1 Primary Abscess Etiology: Wound Location: Right, Medial Gluteus Wound Open Wounding Event: Other Lesion Status: Date Acquired: 08/28/2022 Comorbid Cataracts, Anemia, Asthma, Congestive Heart Failure, Weeks Of Treatment: 0 History: Hypertension, Type II Diabetes, Osteoarthritis Clustered Wound: No Photos [name] [name], [name] [name] ([id number]) [id number] Page 10 of 14 Wound Measurements Length: (cm) 5.9 Width: (cm) 6.2 Depth: (cm) 2.3 Area: (cm) 28.73 Volume: (cm) 66.079 % Reduction in Area: % Reduction in Volume: Epithelialization: None Tunneling: Yes Location 1 Position (o'clock): 5 Maximum Distance: (cm) 3.6 Location 2 Position (o'clock): 10 Maximum Distance: (cm) 2.1 Undermining: No Wound Description Classification: Full Thickness Without Exposed  Suppor Exudate Amount: Large Exudate Type: Serosanguineous Exudate Color: red, brown t Structures Foul Odor After Cleansing: No Slough/Fibrino Yes Wound Bed Granulation Amount: Large (67-100%) Exposed Structure Granulation Quality: Red Fat Layer (Subcutaneous Tissue) Exposed: Yes Necrotic Amount: Small (1-33%) Necrotic Quality: Adherent Scientist, physiological) Signed: 11/10/2022 1:13:27 PM By: Angelina Pih Entered By: Angelina Pih on 11/10/2022 10:58:14 -------------------------------------------------------------------------------- Wound Assessment Details Patient Name: Date of Service: [name] [name], [name] [name] 11/10/2022 10:00 A M Medical Record Number: [id number] Patient Account Number: [id number] Date of Birth/Sex: Treating RN: 1933/02/02 (89 y.o. [name] [name] Primary Care [name] [name]: [name] [name] Other Clinician: Referring [name] [name]: Treating [name] [name]/Extender: [name] [name] Weeks in Treatment: 0 Wound Status Wound Number: 2 Primary Open Surgical Wound Etiology: Wound Location: Right, Lateral Gluteus Wound Open Wounding Event: Surgical Injury Status: Date Acquired: 09/14/2022 Comorbid Cataracts, Anemia, Asthma, Congestive Heart Failure, Weeks Of Treatment: 0 History: Hypertension, Type II Diabetes, Osteoarthritis Clustered Wound: No [name] [name] ([id number]) [id number] Page 11 of 14 Photos Wound Measurements Length: (cm) 0.3 Width: (cm) 0.7 Depth: (cm) 0.1 Area: (cm) 0.165 Volume: (cm) 0.016 % Reduction in Area: % Reduction in Volume: Epithelialization: None Tunneling: No Undermining: No Wound Description Classification: Full Thickness Without Exposed Suppor Exudate Amount: Medium Exudate Type: Serosanguineous Exudate Color: red, brown t Structures Foul Odor After Cleansing: No Slough/Fibrino No Wound Bed Granulation Amount: Large (67-100%) Exposed Structure Granulation Quality: Red Fat Layer  (Subcutaneous Tissue) Exposed: Yes Necrotic Amount: None Present (0%) Electronic Signature(s) Signed: 11/10/2022 1:13:27 PM By: Angelina Pih Entered By: Angelina Pih on 11/10/2022 11:00:03 -------------------------------------------------------------------------------- Wound Assessment Details Patient Name: Date of Service: [name] [name], [name] [name] 11/10/2022 10:00 A M Medical Record Number: [id number] Patient Account Number: [id number] Date of Birth/Sex: Treating RN: Sep 21, 1932 (89 y.o. [name] [name] Primary Care [name] [name]: [name] [name] Other Clinician: Referring [name] [name]: Treating [name] [name]/Extender: [name] [name] Weeks in Treatment: 0 Wound Status Wound Number: 3 Primary Open Surgical Wound Etiology: Wound Location: Right, Distal Back Wound Open Wounding Event: Other Lesion Status: Date Acquired: 09/14/2022 Comorbid Cataracts, Anemia, Asthma, Congestive Heart Failure, Weeks Of Treatment: 0 History: Hypertension, Type II Diabetes, Osteoarthritis Clustered Wound: No Photos [name] [name], [name] [name] ([id number]) [id number] Page 12 of 14 Wound Measurements Length: (cm) 0.2 Width: (cm) 1.3 Depth: (cm) 0.7 Area: (cm) 0.204 Volume: (cm) 0.143 % Reduction in Area: % Reduction in Volume: Epithelialization: None Tunneling: No Undermining: No Wound Description Classification: Full Thickness Without Exposed Suppor Exudate Amount: Medium Exudate Type: Serosanguineous Exudate Color: red, brown t Structures Foul Odor After Cleansing: No Slough/Fibrino No Wound Bed Granulation Amount: Large (67-100%) Exposed Structure Granulation Quality: Red Fat Layer (Subcutaneous Tissue) Exposed: Yes Necrotic Amount: None Present (0%) Electronic Signature(s) Signed: 11/10/2022 1:13:27 PM By: Angelina Pih Entered By: Angelina Pih on 11/10/2022 11:01:08 -------------------------------------------------------------------------------- Wound Assessment  Details Patient Name: Date of Service: [name] [name], [name] [name]  Forceps, Scissors N/A N/A Instrument: None N/A N/A Bleeding: Procedure was tolerated well N/A N/A Debridement Treatment Response: 5.9x6.2x2.3 N/A N/A Post Debridement Measurements L x W x D (cm) 66.079 N/A N/A Post Debridement Volume: (cm) Debridement N/A N/A Procedures Performed: Wound Number: 4 5 N/A Photos: N/A Left Gluteus Right, Distal Gluteus N/A Wound Location: Surgical Injury Surgical Injury N/A Wounding Event: Open Surgical Wound Open Surgical Wound N/A Primary Etiology: Cataracts, Anemia, Asthma, Cataracts, Anemia, Asthma, N/A Comorbid History: Congestive Heart Failure,  Congestive Heart Failure, Hypertension, Type II Diabetes, Hypertension, Type II Diabetes, Osteoarthritis Osteoarthritis 09/14/2022 09/14/2022 N/A Date A cquired: 0 0 N/A Weeks of Treatment: Open Open N/A Wound Status: No No N/A Wound Recurrence: 0.3x1.5x0.2 0.3x0.7x0.4 N/A Measurements L x W x D (cm) 0.353 0.165 N/A A (cm) : rea 0.071 0.066 N/A Volume (cm) : No No N/A Tunneling: Full Thickness Without Exposed Full Thickness Without Exposed N/A Classification: Support Structures Support Structures Medium Medium N/A Exudate A mount: Serosanguineous Serosanguineous N/A Exudate Type: red, brown red, brown N/A Exudate Color: Large (67-100%) Large (67-100%) N/A Granulation A mount: Red Red N/A Granulation Quality: None Present (0%) None Present (0%) N/A Necrotic A mount: Fat Layer (Subcutaneous Tissue): Yes Fat Layer (Subcutaneous Tissue): Yes N/A Exposed Structures: None None N/A Epithelialization: N/A N/A N/A Debridement: N/A N/A N/A Pain Control: N/A N/A N/A Tissue Debrided: N/A N/A N/A Level: N/A N/A N/A Debridement A (sq cm): rea N/A N/A N/A Instrument: N/A N/A N/A Bleeding: Debridement Treatment Response: N/A N/A N/A Post Debridement Measurements L x N/A N/A N/A W x D (cm) N/A N/A N/A Post Debridement Volume: (cm) N/A N/A N/A Procedures Performed: [name] [name], [name] [name] ([id number]) [id number] Page 6 of 14 Treatment Notes Electronic Signature(s) Signed: 11/10/2022 12:32:02 PM By: Angelina Pih Entered By: Angelina Pih on 11/10/2022 12:32:02 -------------------------------------------------------------------------------- Multi-Disciplinary Care Plan Details Patient Name: Date of Service: [name] [name] C. 11/10/2022 10:00 A M Medical Record Number: [id number] Patient Account Number: [id number] Date of Birth/Sex: Treating RN: 05/18/1932 (89 y.o. [name] [name] Primary Care Cordell Guercio: [name] [name] Other  Clinician: Referring Aryaan Persichetti: Treating Roni Scow/Extender: [name] [name] Weeks in Treatment: 0 Active Inactive Necrotic Tissue Nursing Diagnoses: Impaired tissue integrity related to necrotic/devitalized tissue Goals: Necrotic/devitalized tissue will be minimized in the wound bed Date Initiated: 11/10/2022 Target Resolution Date: 01/05/2023 Goal Status: Active Patient/caregiver will verbalize understanding of reason and process for debridement of necrotic tissue Date Initiated: 11/10/2022 Date Inactivated: 11/10/2022 Target Resolution Date: 11/10/2022 Goal Status: Met Interventions: Assess patient pain level pre-, during and post procedure and prior to discharge Provide education on necrotic tissue and debridement process Treatment Activities: Excisional debridement : 11/10/2022 Notes: Pressure Nursing Diagnoses: Knowledge deficit related to causes and risk factors for pressure ulcer development Potential for impaired tissue integrity related to pressure, friction, moisture, and shear Goals: Patient will remain free from development of additional pressure ulcers Date Initiated: 11/10/2022 Target Resolution Date: 01/05/2023 Goal Status: Active Patient will remain free of pressure ulcers Date Initiated: 11/10/2022 Target Resolution Date: 01/05/2023 Goal Status: Active Patient/caregiver will verbalize risk factors for pressure ulcer development Date Initiated: 11/10/2022 Date Inactivated: 11/10/2022 Target Resolution Date: 11/10/2022 Goal Status: Met Patient/caregiver will verbalize understanding of pressure ulcer management Date Initiated: 11/10/2022 Date Inactivated: 11/10/2022 Target Resolution Date: 11/10/2022 Goal Status: Met Interventions: [name] [name], [name] [name] ([id number]) [id number] Page 7 of 14 Assess: immobility, friction, shearing, incontinence upon admission and as needed Assess offloading mechanisms upon admission and as needed Assess  potential for pressure ulcer upon admission  Forceps, Scissors N/A N/A Instrument: None N/A N/A Bleeding: Procedure was tolerated well N/A N/A Debridement Treatment Response: 5.9x6.2x2.3 N/A N/A Post Debridement Measurements L x W x D (cm) 66.079 N/A N/A Post Debridement Volume: (cm) Debridement N/A N/A Procedures Performed: Wound Number: 4 5 N/A Photos: N/A Left Gluteus Right, Distal Gluteus N/A Wound Location: Surgical Injury Surgical Injury N/A Wounding Event: Open Surgical Wound Open Surgical Wound N/A Primary Etiology: Cataracts, Anemia, Asthma, Cataracts, Anemia, Asthma, N/A Comorbid History: Congestive Heart Failure,  Congestive Heart Failure, Hypertension, Type II Diabetes, Hypertension, Type II Diabetes, Osteoarthritis Osteoarthritis 09/14/2022 09/14/2022 N/A Date A cquired: 0 0 N/A Weeks of Treatment: Open Open N/A Wound Status: No No N/A Wound Recurrence: 0.3x1.5x0.2 0.3x0.7x0.4 N/A Measurements L x W x D (cm) 0.353 0.165 N/A A (cm) : rea 0.071 0.066 N/A Volume (cm) : No No N/A Tunneling: Full Thickness Without Exposed Full Thickness Without Exposed N/A Classification: Support Structures Support Structures Medium Medium N/A Exudate A mount: Serosanguineous Serosanguineous N/A Exudate Type: red, brown red, brown N/A Exudate Color: Large (67-100%) Large (67-100%) N/A Granulation A mount: Red Red N/A Granulation Quality: None Present (0%) None Present (0%) N/A Necrotic A mount: Fat Layer (Subcutaneous Tissue): Yes Fat Layer (Subcutaneous Tissue): Yes N/A Exposed Structures: None None N/A Epithelialization: N/A N/A N/A Debridement: N/A N/A N/A Pain Control: N/A N/A N/A Tissue Debrided: N/A N/A N/A Level: N/A N/A N/A Debridement A (sq cm): rea N/A N/A N/A Instrument: N/A N/A N/A Bleeding: Debridement Treatment Response: N/A N/A N/A Post Debridement Measurements L x N/A N/A N/A W x D (cm) N/A N/A N/A Post Debridement Volume: (cm) N/A N/A N/A Procedures Performed: [name] [name], [name] [name] ([id number]) [id number] Page 6 of 14 Treatment Notes Electronic Signature(s) Signed: 11/10/2022 12:32:02 PM By: Angelina Pih Entered By: Angelina Pih on 11/10/2022 12:32:02 -------------------------------------------------------------------------------- Multi-Disciplinary Care Plan Details Patient Name: Date of Service: [name] [name] C. 11/10/2022 10:00 A M Medical Record Number: [id number] Patient Account Number: [id number] Date of Birth/Sex: Treating RN: 05/18/1932 (89 y.o. [name] [name] Primary Care Cordell Guercio: [name] [name] Other  Clinician: Referring Aryaan Persichetti: Treating Roni Scow/Extender: [name] [name] Weeks in Treatment: 0 Active Inactive Necrotic Tissue Nursing Diagnoses: Impaired tissue integrity related to necrotic/devitalized tissue Goals: Necrotic/devitalized tissue will be minimized in the wound bed Date Initiated: 11/10/2022 Target Resolution Date: 01/05/2023 Goal Status: Active Patient/caregiver will verbalize understanding of reason and process for debridement of necrotic tissue Date Initiated: 11/10/2022 Date Inactivated: 11/10/2022 Target Resolution Date: 11/10/2022 Goal Status: Met Interventions: Assess patient pain level pre-, during and post procedure and prior to discharge Provide education on necrotic tissue and debridement process Treatment Activities: Excisional debridement : 11/10/2022 Notes: Pressure Nursing Diagnoses: Knowledge deficit related to causes and risk factors for pressure ulcer development Potential for impaired tissue integrity related to pressure, friction, moisture, and shear Goals: Patient will remain free from development of additional pressure ulcers Date Initiated: 11/10/2022 Target Resolution Date: 01/05/2023 Goal Status: Active Patient will remain free of pressure ulcers Date Initiated: 11/10/2022 Target Resolution Date: 01/05/2023 Goal Status: Active Patient/caregiver will verbalize risk factors for pressure ulcer development Date Initiated: 11/10/2022 Date Inactivated: 11/10/2022 Target Resolution Date: 11/10/2022 Goal Status: Met Patient/caregiver will verbalize understanding of pressure ulcer management Date Initiated: 11/10/2022 Date Inactivated: 11/10/2022 Target Resolution Date: 11/10/2022 Goal Status: Met Interventions: [name] [name], [name] [name] ([id number]) [id number] Page 7 of 14 Assess: immobility, friction, shearing, incontinence upon admission and as needed Assess offloading mechanisms upon admission and as needed Assess  potential for pressure ulcer upon admission

## 2022-11-13 ENCOUNTER — Ambulatory Visit: Payer: Medicare Other | Attending: Cardiology | Admitting: Cardiology

## 2022-11-13 ENCOUNTER — Encounter: Payer: Self-pay | Admitting: Cardiology

## 2022-11-13 ENCOUNTER — Telehealth: Payer: Self-pay

## 2022-11-13 VITALS — BP 138/62 | HR 75 | Ht 66.0 in | Wt 186.2 lb

## 2022-11-13 DIAGNOSIS — I10 Essential (primary) hypertension: Secondary | ICD-10-CM

## 2022-11-13 DIAGNOSIS — E1165 Type 2 diabetes mellitus with hyperglycemia: Secondary | ICD-10-CM | POA: Diagnosis not present

## 2022-11-13 DIAGNOSIS — N1832 Chronic kidney disease, stage 3b: Secondary | ICD-10-CM

## 2022-11-13 DIAGNOSIS — E785 Hyperlipidemia, unspecified: Secondary | ICD-10-CM

## 2022-11-13 DIAGNOSIS — I5032 Chronic diastolic (congestive) heart failure: Secondary | ICD-10-CM

## 2022-11-13 DIAGNOSIS — Z79899 Other long term (current) drug therapy: Secondary | ICD-10-CM

## 2022-11-13 MED ORDER — HYDRALAZINE HCL 100 MG PO TABS: 100.0000 mg | ORAL_TABLET | Freq: Three times a day (TID) | ORAL | 3 refills | Status: DC

## 2022-11-13 MED ORDER — AMLODIPINE BESYLATE 5 MG PO TABS: 5.0000 mg | ORAL_TABLET | Freq: Every day | ORAL | 3 refills | Status: DC

## 2022-11-13 MED ORDER — TORSEMIDE 20 MG PO TABS: 20.0000 mg | ORAL_TABLET | Freq: Every day | ORAL | 3 refills | Status: DC

## 2022-11-13 MED ORDER — SPIRONOLACTONE 25 MG PO TABS: 25.0000 mg | ORAL_TABLET | Freq: Every day | ORAL | 3 refills | Status: DC

## 2022-11-13 MED ORDER — AMLODIPINE BESYLATE 5 MG PO TABS
5.0000 mg | ORAL_TABLET | Freq: Every day | ORAL | 3 refills | Status: AC
Start: 2022-11-13 — End: 2024-01-10

## 2022-11-13 MED ORDER — CARVEDILOL 12.5 MG PO TABS: 12.5000 mg | ORAL_TABLET | Freq: Two times a day (BID) | ORAL | 3 refills | Status: DC

## 2022-11-13 NOTE — Telephone Encounter (Signed)
Called pt see if she can come in sooner. No answer, left message for her to return the call.

## 2022-11-13 NOTE — Patient Instructions (Addendum)
Medication Instructions:  Your physician has recommended you make the following change in your medication:  INCREASE: Amlodipine (Norvasc) 5 mg  *If you need a refill on your cardiac medications before your next appointment, please call your pharmacy*   Lab Work: Your physician recommends that you have labs drawn today: CMET, Mag If you have labs (blood work) drawn today and your tests are completely normal, you will receive your results only by: MyChart Message (if you have MyChart) OR A paper copy in the mail If you have any lab test that is abnormal or we need to change your treatment, we will call you to review the results.   Testing/Procedures: None   Follow-Up: At [facility], you and your health needs are our priority.  As part of our continuing mission to provide you with exceptional heart care, we have created designated Provider Care Teams.  These Care Teams include your primary Cardiologist (physician) and Advanced Practice Providers (APPs -  Physician Assistants and Nurse Practitioners) who all work together to provide you with the care you need, when you need it.   Your next appointment:   4 month(s)  Provider:   Thomasene Ripple, DO

## 2022-11-14 LAB — COMPREHENSIVE METABOLIC PANEL
ALT: 15 IU/L (ref 0–32)
AST: 18 IU/L (ref 0–40)
Albumin: 3.8 g/dL (ref 3.7–4.7)
Alkaline Phosphatase: 74 IU/L (ref 44–121)
BUN/Creatinine Ratio: 19 (ref 12–28)
BUN: 28 mg/dL — ABNORMAL HIGH (ref 8–27)
Bilirubin Total: 0.4 mg/dL (ref 0.0–1.2)
CO2: 26 mmol/L (ref 20–29)
Calcium: 9.3 mg/dL (ref 8.7–10.3)
Chloride: 96 mmol/L (ref 96–106)
Creatinine, Ser: 1.51 mg/dL — ABNORMAL HIGH (ref 0.57–1.00)
Globulin, Total: 3.3 g/dL (ref 1.5–4.5)
Glucose: 253 mg/dL — ABNORMAL HIGH (ref 70–99)
Potassium: 4.5 mmol/L (ref 3.5–5.2)
Sodium: 144 mmol/L (ref 134–144)
Total Protein: 7.1 g/dL (ref 6.0–8.5)
eGFR: 33 mL/min/{1.73_m2} — ABNORMAL LOW (ref >59)

## 2022-11-14 LAB — MAGNESIUM: Magnesium: 1.3 mg/dL — ABNORMAL LOW (ref 1.6–2.3)

## 2022-11-16 ENCOUNTER — Telehealth: Payer: Self-pay

## 2022-11-16 ENCOUNTER — Other Ambulatory Visit: Payer: Self-pay

## 2022-11-16 ENCOUNTER — Telehealth: Payer: Self-pay | Admitting: Cardiology

## 2022-11-16 DIAGNOSIS — I5033 Acute on chronic diastolic (congestive) heart failure: Secondary | ICD-10-CM

## 2022-11-16 DIAGNOSIS — I10 Essential (primary) hypertension: Secondary | ICD-10-CM

## 2022-11-16 DIAGNOSIS — N1832 Chronic kidney disease, stage 3b: Secondary | ICD-10-CM

## 2022-11-16 NOTE — Telephone Encounter (Signed)
Call to Viona Gilmore, daughter,. Call goes straight to VM.  LM to call office

## 2022-11-16 NOTE — Telephone Encounter (Signed)
Called provided number. Spoke to pt's daughter-in-law. She is not on the DPR, could not give her the results and recommendations. Called pt's daughter [name] [name], no answer. Left message for her to return the call. Also released info to MyChart.

## 2022-11-16 NOTE — Telephone Encounter (Signed)
Patient's daughter is returning LPN's call. Advised her of results being released on mychart.   She states she will review them there and callback or send a mychart message if she has further questions.

## 2022-11-16 NOTE — Telephone Encounter (Signed)
Patient's daughter Toniann Fail is following up requesting to discuss results.

## 2022-11-17 NOTE — Telephone Encounter (Signed)
Separate encounter, daughter called back  Patient's daughter is returning LPN's call. Advised her of results being released on mychart.    She states she will review them there and callback or send a mychart message if she has further questions.

## 2022-11-17 NOTE — Telephone Encounter (Signed)
Patient's daughter was returning phone call about patient's results

## 2022-11-17 NOTE — Telephone Encounter (Signed)
Daughter called again.  She ask if patient needs labs.  Read results again and advised l;abs to be done in 1 week after starting the medication.  Advised can be done at our lab.  Gave hours and walk in is OK.  She states understanding

## 2022-11-17 NOTE — Progress Notes (Signed)
Cardiology Office Note:    Date:  11/17/2022   ID:  Rameka, Vanderheyden [birth year], MRN [id number]  PCP:  [name], MD  Cardiologist:  Thomasene Ripple, DO  Electrophysiologist:  None   Referring MD: [name], MD     History of Present Illness:    [name] [name] is a 89 y.o. female with a hx of chronic diastolic hypertension, diabetes mellitus type 2, CVA, dyslipidemia, CKD 3B, obesity, hypertension here today for follow-up visit.  The patient was recently hospitalized at [facility] where she was treated for acute on chronic diastolic heart failure.  During that hospitalization she was also treated with antibiotics for her gluteal abscess and had an I&D along with wound VAC.  Doing her hospitalization she was transition from Lasix to torsemide.  Since her discharge she has been doing well she is with her family she does have physical therapy.  She continues to be on hydralazine 3 times a day 100 mg, spironolactone 25 mg daily, torsemide as well as 12.5 mg of carvedilol.  She is here today with her daughter and her son.  No complaints  Past Medical History:  Diagnosis Date   Colon polyp    Diabetes mellitus 1992   Hypertension 1996   Pancreatitis     Past Surgical History:  Procedure Laterality Date   CHOLECYSTECTOMY     COLONOSCOPY     ERCP     INCISION AND DRAINAGE ABSCESS N/A 09/12/2022   Procedure: INCISION AND DRAINAGE ABSCESS;  Surgeon: Karie Soda, MD;  Location: [facility];  Service: General;  Laterality: N/A;   PARTIAL HYSTERECTOMY  89years old   POLYPECTOMY     UPPER GASTROINTESTINAL ENDOSCOPY     WRIST FRACTURE SURGERY  left arm,89years old    Current Medications: Current Meds  Medication Sig   acetaminophen (TYLENOL) 500 MG tablet Take 1,000 mg by mouth 2 (two) times daily.   albuterol (PROVENTIL) (2.5 MG/3ML) 0.083% nebulizer solution Take 3 mLs (2.5 mg total) by nebulization every 4 (four) hours as needed for wheezing or shortness of breath  (per home regimen).   albuterol (VENTOLIN HFA) 108 (90 Base) MCG/ACT inhaler Inhale 2 puffs into the lungs every 6 (six) hours as needed for wheezing or shortness of breath.   amantadine (SYMMETREL) 100 MG capsule Take 1 capsule (100 mg total) by mouth daily.   ASPIRIN 81 PO Take 81 mg by mouth in the morning.   atorvastatin (LIPITOR) 40 MG tablet Take 1 tablet (40 mg total) by mouth daily.   B Complex-C (B-COMPLEX WITH VITAMIN C) tablet Take 1 tablet by mouth daily.   bisacodyl 5 MG EC tablet Take 5 mg by mouth daily as needed for moderate constipation.   diclofenac Sodium (VOLTAREN ARTHRITIS PAIN) 1 % GEL Apply 2 g topically 4 (four) times daily. (Patient taking differently: Apply 4 g topically 3 (three) times daily. Right knee)   ferrous sulfate 325 (65 FE) MG tablet Take 325 mg by mouth every other day.   Glucerna (GLUCERNA) LIQD Take 237 mLs by mouth daily.   glucose blood (ACCU-CHEK GUIDE) test strip 1 each by Other route in the morning, at noon, in the evening, and at bedtime.   insulin lispro (HUMALOG) 100 UNIT/ML injection Inject 0-10 Units into the skin 4 (four) times daily.   Insulin Syringe-Needle U-100 31G X 5/16" 1 ML MISC 1 application by Does not apply route 2 (two) times daily.   ipratropium-albuterol (DUONEB) 0.5-2.5 (3) MG/3ML SOLN  Take 3 mLs by nebulization as needed.   LANTUS SOLOSTAR 100 UNIT/ML Solostar Pen Inject 10 Units into the skin 2 (two) times daily. (Patient taking differently: Inject 17 Units into the skin 2 (two) times daily.)   lidocaine (LIDODERM) 5 % APPLY (1) PATCH TO LOWER BACK AND RIGHT KNEE ONCE DAILY. LEAVE ON 12 HOURS. LEAVE OFF 12 HOURS. (Patient taking differently: Place 1 patch onto the skin See admin instructions. Apply 1 patch to the lower back and right knee once a day- on 12 hours/off 12 hours)   methocarbamol (ROBAXIN) 500 MG tablet Take 250 mg by mouth at bedtime as needed for muscle spasms.   polyethylene glycol (MIRALAX / GLYCOLAX) 17 g packet  Take 17 g by mouth 2 (two) times daily as needed for mild constipation or moderate constipation.   QUEtiapine (SEROQUEL) 25 MG tablet Take 0.5 tablets (12.5 mg total) by mouth at bedtime as needed (anxiety).   senna-docusate (SENOKOT-S) 8.6-50 MG tablet Take 1 tablet by mouth 2 (two) times daily.   sodium hypochlorite (DAKIN'S 1/4 STRENGTH) 0.125 % SOLN Irrigate with 1 Application as directed every Monday, Wednesday, and Friday.   traMADol (ULTRAM) 50 MG tablet Take 50 mg by mouth at bedtime as needed for moderate pain.   traMADol-acetaminophen (ULTRACET) 37.5-325 MG tablet Take 1 tablet by mouth 2 (two) times daily.   [DISCONTINUED] amLODipine (NORVASC) 2.5 MG tablet Take 2.5 mg by mouth daily.   [DISCONTINUED] amLODipine (NORVASC) 5 MG tablet Take 1 tablet (5 mg total) by mouth daily.   [DISCONTINUED] carvedilol (COREG) 12.5 MG tablet Take 1 tablet (12.5 mg total) by mouth 2 (two) times daily with a meal.   [DISCONTINUED] hydrALAZINE (APRESOLINE) 100 MG tablet Take 1 tablet (100 mg total) by mouth 3 (three) times daily.   [DISCONTINUED] spironolactone (ALDACTONE) 25 MG tablet Take 1 tablet (25 mg total) by mouth daily.   [DISCONTINUED] torsemide (DEMADEX) 20 MG tablet Take 1 tablet (20 mg total) by mouth daily.     Allergies:   Amlodipine, Ace inhibitors, Atorvastatin, Canagliflozin, Clonidine hcl, Buprenorphine, Lisinopril, and Morphine and codeine   Social History   Socioeconomic History   Marital status: Widowed    Spouse name: Not on file   Number of children: Not on file   Years of education: Not on file   Highest education level: Not on file  Occupational History   Not on file  Tobacco Use   Smoking status: Never   Smokeless tobacco: Never  Vaping Use   Vaping status: Never Used  Substance and Sexual Activity   Alcohol use: Never   Drug use: Never   Sexual activity: Not on file  Other Topics Concern   Not on file  Social History Narrative   Not on file   Social  Determinants of Health   Financial Resource Strain: Low Risk  (06/14/2022)   Received from [facility], [facility]   Overall Financial Resource Strain (CARDIA)    Difficulty of Paying Living Expenses: Not hard at all  Food Insecurity: No Food Insecurity (11/02/2022)   Hunger Vital Sign    Worried About Running Out of Food in the Last Year: Never true    Ran Out of Food in the Last Year: Never true  Transportation Needs: No Transportation Needs (11/02/2022)   PRAPARE - Administrator, Civil Service (Medical): No    Lack of Transportation (Non-Medical): No  Physical Activity: Inactive (05/19/2019)   Received from [facility], [facility]  Exercise Vital Sign    Days of Exercise per Week: 0 days    Minutes of Exercise per Session: 0 min  Stress: No Stress Concern Present (12/05/2021)   Received from [facility], [facility] of Occup[facility] - Occupational Stress Questionnaire    Feeling of Stress : Not at all  Social Connections: Socially Integrated (12/05/2021)   Received from [facility], [facility]   Social Network    How would you rate your social network (family, work, friends)?: Good participation with social networks     Family History: The patient's family history is not on file.  ROS:   Review of Systems  Constitution: Negative for decreased appetite, fever and weight gain.  HENT: Negative for congestion, ear discharge, hoarse voice and sore throat.   Eyes: Negative for discharge, redness, vision loss in right eye and visual halos.  Cardiovascular: Negative for chest pain, dyspnea on exertion, leg swelling, orthopnea and palpitations.  Respiratory: Negative for cough, hemoptysis, shortness of breath and snoring.   Endocrine: Negative for heat intolerance and polyphagia.  Hematologic/Lymphatic: Negative for bleeding problem. Does not bruise/bleed easily.  Skin: Negative for flushing, nail changes, rash and suspicious  lesions.  Musculoskeletal: Negative for arthritis, joint pain, muscle cramps, myalgias, neck pain and stiffness.  Gastrointestinal: Negative for abdominal pain, bowel incontinence, diarrhea and excessive appetite.  Genitourinary: Negative for decreased libido, genital sores and incomplete emptying.  Neurological: Negative for brief paralysis, focal weakness, headaches and loss of balance.  Psychiatric/Behavioral: Negative for altered mental status, depression and suicidal ideas.  Allergic/Immunologic: Negative for HIV exposure and persistent infections.    EKGs/Labs/Other Studies Reviewed:    The following studies were reviewed today:   EKG:  None today   Recent Labs: 11/03/2022: B Natriuretic Peptide 440.7 11/04/2022: Hemoglobin 9.4; Platelets 245 11/13/2022: ALT 15; BUN 28; Creatinine, Ser 1.51; Magnesium 1.3; Potassium 4.5; Sodium 144  Recent Lipid Panel    Component Value Date/Time   CHOL 201 (H) 01/04/2020 1601   TRIG 398 (H) 01/06/2020 0512   HDL 37 (L) 01/04/2020 1601   CHOLHDL 5.4 01/04/2020 1601   VLDL UNABLE TO CALCULATE IF TRIGLYCERIDE OVER 400 mg/dL 72/53/6644 0347   LDLCALC UNABLE TO CALCULATE IF TRIGLYCERIDE OVER 400 mg/dL 42/59/5638 7564   LDLDIRECT 121.8 (H) 01/04/2020 1601    Physical Exam:    VS:  BP 138/62 (BP Location: Left Arm, Patient Position: Sitting, Cuff Size: Normal)   Pulse 75   Ht 5\' 6"  (1.676 m)   Wt 186 lb 3.2 oz (84.5 kg)   SpO2 97%   BMI 30.05 kg/m     Wt Readings from Last 3 Encounters:  11/13/22 186 lb 3.2 oz (84.5 kg)  11/04/22 205 lb (93 kg)  09/22/22 295 lb (133.8 kg)     GEN: Well nourished, well developed in no acute distress HEENT: Normal NECK: No JVD; No carotid bruits LYMPHATICS: No lymphadenopathy CARDIAC: S1S2 noted,RRR, no murmurs, rubs, gallops RESPIRATORY:  Clear to auscultation without rales, wheezing or rhonchi  ABDOMEN: Soft, non-tender, non-distended, +bowel sounds, no guarding. EXTREMITIES: No edema, No cyanosis,  no clubbing MUSCULOSKELETAL:  No deformity  SKIN: Warm and dry NEUROLOGIC:  Alert and oriented x 3, non-focal PSYCHIATRIC:  Normal affect, good insight  ASSESSMENT:    1. Medication management   2. Essential hypertension   3. Chronic diastolic congestive heart failure (HCC)   4. Uncontrolled type 2 diabetes mellitus with hyperglycemia (HCC)   5. Stage 3b chronic kidney  disease (HCC)   6. Dyslipidemia    PLAN:     Her blood pressure is not at target in the office, will increase her Amlodipine to 5 mg daily. Will continue other antihypertensive medication: Coreg 12.5 mg BID, hydralazine 100 mg TID, Aldactone 25 mg daily and torsemide 20 mg daily.   Will get blood work today for [employer].  The patient is in agreement with the above plan. The patient left the office in stable condition.  The patient will follow up in 4 months or sooner if needed.   Medication Adjustments/Labs and Tests Ordered: Current medicines are reviewed at length with the patient today.  Concerns regarding medicines are outlined above.  Orders Placed This Encounter  Procedures   Comprehensive Metabolic Panel (CMET)   Magnesium   Meds ordered this encounter  Medications   DISCONTD: amLODipine (NORVASC) 5 MG tablet    Sig: Take 1 tablet (5 mg total) by mouth daily.    Dispense:  90 tablet    Refill:  3   DISCONTD: spironolactone (ALDACTONE) 25 MG tablet    Sig: Take 1 tablet (25 mg total) by mouth daily.    Dispense:  90 tablet    Refill:  3   DISCONTD: torsemide (DEMADEX) 20 MG tablet    Sig: Take 1 tablet (20 mg total) by mouth daily.    Dispense:  90 tablet    Refill:  3   DISCONTD: carvedilol (COREG) 12.5 MG tablet    Sig: Take 1 tablet (12.5 mg total) by mouth 2 (two) times daily with a meal.    Dispense:  180 tablet    Refill:  3   DISCONTD: hydrALAZINE (APRESOLINE) 100 MG tablet    Sig: Take 1 tablet (100 mg total) by mouth 3 (three) times daily.    Dispense:  270 tablet    Refill:  3    amLODipine (NORVASC) 5 MG tablet    Sig: Take 1 tablet (5 mg total) by mouth daily.    Dispense:  90 tablet    Refill:  3   carvedilol (COREG) 12.5 MG tablet    Sig: Take 1 tablet (12.5 mg total) by mouth 2 (two) times daily with a meal.    Dispense:  180 tablet    Refill:  3   hydrALAZINE (APRESOLINE) 100 MG tablet    Sig: Take 1 tablet (100 mg total) by mouth 3 (three) times daily.    Dispense:  270 tablet    Refill:  3   spironolactone (ALDACTONE) 25 MG tablet    Sig: Take 1 tablet (25 mg total) by mouth daily.    Dispense:  90 tablet    Refill:  3   torsemide (DEMADEX) 20 MG tablet    Sig: Take 1 tablet (20 mg total) by mouth daily.    Dispense:  90 tablet    Refill:  3    Patient Instructions  Medication Instructions:  Your physician has recommended you make the following change in your medication:  INCREASE: Amlodipine (Norvasc) 5 mg  *If you need a refill on your cardiac medications before your next appointment, please call your pharmacy*   Lab Work: Your physician recommends that you have labs drawn today: CMET, Mag If you have labs (blood work) drawn today and your tests are completely normal, you will receive your results only by: MyChart Message (if you have MyChart) OR A paper copy in the mail If you have any lab test that is  abnormal or we need to change your treatment, we will call you to review the results.   Testing/Procedures: None   Follow-Up: At [facility], you and your health needs are our priority.  As part of our continuing mission to provide you with exceptional heart care, we have created designated Provider Care Teams.  These Care Teams include your primary Cardiologist (physician) and Advanced Practice Providers (APPs -  Physician Assistants and Nurse Practitioners) who all work together to provide you with the care you need, when you need it.   Your next appointment:   4 month(s)  Provider:   Thomasene Ripple, DO     Adopting a  Healthy Lifestyle.  Know what a healthy weight is for you (roughly BMI <25) and aim to maintain this   Aim for 7+ servings of fruits and vegetables daily   65-80+ fluid ounces of water or unsweet tea for healthy kidneys   Limit to max 1 drink of alcohol per day; avoid smoking/tobacco   Limit animal fats in diet for cholesterol and heart health - choose grass fed whenever available   Avoid highly processed foods, and foods high in saturated/trans fats   Aim for low stress - take time to unwind and care for your mental health   Aim for 150 min of moderate intensity exercise weekly for heart health, and weights twice weekly for bone health   Aim for 7-9 hours of sleep daily   When it comes to diets, agreement about the perfect plan isnt easy to find, even among the experts. Experts at the [facility] [employer] developed an idea known as the Healthy Eating Plate. Just imagine a plate divided into logical, healthy portions.   The emphasis is on diet quality:   Load up on vegetables and fruits - one-half of your plate: Aim for color and variety, and remember that potatoes dont count.   Go for whole grains - one-quarter of your plate: Whole wheat, barley, wheat berries, quinoa, oats, brown rice, and foods made with them. If you want pasta, go with whole wheat pasta.   Protein power - one-quarter of your plate: Fish, chicken, beans, and nuts are all healthy, versatile protein sources. Limit red meat.   The diet, however, does go beyond the plate, offering a few other suggestions.   Use healthy plant oils, such as olive, canola, soy, corn, sunflower and peanut. Check the labels, and avoid partially hydrogenated oil, which have unhealthy trans fats.   If youre thirsty, drink water. Coffee and tea are good in moderation, but skip sugary drinks and limit milk and dairy products to one or two daily servings.   The type of carbohydrate in the diet is more important than the  amount. Some sources of carbohydrates, such as vegetables, fruits, whole grains, and beans-are healthier than others.   Finally, stay active  Signed, Thomasene Ripple, DO  11/17/2022 11:11 PM    [facility]

## 2022-11-20 ENCOUNTER — Other Ambulatory Visit: Payer: Self-pay

## 2022-11-20 ENCOUNTER — Telehealth: Payer: Self-pay | Admitting: Cardiology

## 2022-11-20 DIAGNOSIS — Z79899 Other long term (current) drug therapy: Secondary | ICD-10-CM

## 2022-11-20 DIAGNOSIS — R899 Unspecified abnormal finding in specimens from other organs, systems and tissues: Secondary | ICD-10-CM

## 2022-11-20 MED ORDER — MAGNESIUM OXIDE 400 MG PO CAPS: 400.0000 mg | ORAL_CAPSULE | Freq: Two times a day (BID) | ORAL | 0 refills | Status: AC

## 2022-11-20 NOTE — Telephone Encounter (Signed)
Pt c/o BP issue: STAT if pt c/o blurred vision, one-sided weakness or slurred speech  1. What are your last 5 BP readings? 118/50  2. Are you having any other symptoms (ex. Dizziness, headache, blurred vision, passed out)? Has had some weakness, but nothing report today at this time.   3. What is your BP issue? Rosalita Chessman, OT with [facility] called with a vital sign alert.

## 2022-11-20 NOTE — Progress Notes (Signed)
Orders for labs places, Magnesium Oxide 400 mg twice daily for 5 days ordered per Dr. Mallory Shirk recommendations.

## 2022-11-20 NOTE — Telephone Encounter (Signed)
Please clarify the dose for 1/2 the dose of hydralazine. You state 1/2 dose (25mg ) but she is currently on 100 mg TID so half dose wold be 50 mg.

## 2022-11-20 NOTE — Telephone Encounter (Signed)
I'm sorry, yes 1/2 dose of hydralazine would be 50mg 

## 2022-11-20 NOTE — Telephone Encounter (Signed)
Called back to daughter and patient verified the 1/2 dose of hydralazine is 50 mg.  She states understanding.  They will bring a copy of readings when come for labs on Wednesday.  They will leave it for Dr Servando Salina to review.

## 2022-11-20 NOTE — Telephone Encounter (Signed)
HH nurse states BP 118/50.  BP reading prior to taking medications this morning was 149/66 Asymptomatic.  Her only complaint is weak feeling, which is reason for HH/  She is taking meds as directed.  She is due for her Hydralazine this afternoon.  Advised to hold mid day dose until speak with provider , due to lower BP.   Please advise if to continue to hold mid day dose and any further recommendations

## 2022-11-20 NOTE — Telephone Encounter (Signed)
Dr. Servando Salina is out of the office this afternoon and tomorrow. Will defer to Pharm-D for recommendations.

## 2022-11-20 NOTE — Telephone Encounter (Addendum)
Would skip dose completley if BP is <110/<55. Would take 1/2 the dose (50mg ) if BP is 110-125 systolic Would take whole dose if BP is >125 systolic

## 2022-11-23 ENCOUNTER — Encounter: Payer: Self-pay | Admitting: Cardiology

## 2022-11-23 ENCOUNTER — Other Ambulatory Visit: Payer: Self-pay

## 2022-11-23 DIAGNOSIS — Z79899 Other long term (current) drug therapy: Secondary | ICD-10-CM

## 2022-11-23 DIAGNOSIS — R899 Unspecified abnormal finding in specimens from other organs, systems and tissues: Secondary | ICD-10-CM

## 2022-11-24 ENCOUNTER — Other Ambulatory Visit: Payer: Self-pay

## 2022-11-24 ENCOUNTER — Ambulatory Visit: Payer: Medicare Other | Admitting: Physician Assistant

## 2022-11-24 ENCOUNTER — Telehealth: Payer: Self-pay | Admitting: Cardiology

## 2022-11-24 LAB — BASIC METABOLIC PANEL
BUN/Creatinine Ratio: 25 (ref 12–28)
BUN: 46 mg/dL — ABNORMAL HIGH (ref 8–27)
CO2: 26 mmol/L (ref 20–29)
Calcium: 9 mg/dL (ref 8.7–10.3)
Chloride: 97 mmol/L (ref 96–106)
Creatinine, Ser: 1.84 mg/dL — ABNORMAL HIGH (ref 0.57–1.00)
Glucose: 205 mg/dL — ABNORMAL HIGH (ref 70–99)
Potassium: 5 mmol/L (ref 3.5–5.2)
Sodium: 141 mmol/L (ref 134–144)
eGFR: 26 mL/min/{1.73_m2} — ABNORMAL LOW (ref >59)

## 2022-11-24 LAB — MAGNESIUM: Magnesium: 2 mg/dL (ref 1.6–2.3)

## 2022-11-24 MED ORDER — HYDRALAZINE HCL 100 MG PO TABS: ORAL_TABLET | ORAL | 3 refills | Status: DC

## 2022-11-24 MED ORDER — HYDRALAZINE HCL 50 MG PO TABS: 50.0000 mg | ORAL_TABLET | Freq: Two times a day (BID) | ORAL | 3 refills | Status: DC

## 2022-11-24 MED ORDER — HYDRALAZINE HCL 25 MG PO TABS: 25.0000 mg | ORAL_TABLET | Freq: Two times a day (BID) | ORAL | 3 refills | Status: DC

## 2022-11-24 NOTE — Telephone Encounter (Signed)
Patient's daughter in law is returning call. °

## 2022-11-24 NOTE — Telephone Encounter (Signed)
Discussed the medication does not come in 100mg  and does she want to split her 100 mg tablets (for the 50 mg dose) and then adda 25 mg.  She states pill cutter does not work well and would prefer a 50 mg tablet and a 25 mg tablet.  These sent to pharmacy as requested

## 2022-11-28 ENCOUNTER — Encounter: Payer: Medicare Other | Attending: Physician Assistant | Admitting: Physician Assistant

## 2022-11-28 ENCOUNTER — Encounter (HOSPITAL_BASED_OUTPATIENT_CLINIC_OR_DEPARTMENT_OTHER): Payer: Medicare Other | Admitting: General Surgery

## 2022-11-28 DIAGNOSIS — I130 Hypertensive heart and chronic kidney disease with heart failure and stage 1 through stage 4 chronic kidney disease, or unspecified chronic kidney disease: Secondary | ICD-10-CM | POA: Diagnosis not present

## 2022-11-28 DIAGNOSIS — I5042 Chronic combined systolic (congestive) and diastolic (congestive) heart failure: Secondary | ICD-10-CM | POA: Insufficient documentation

## 2022-11-28 DIAGNOSIS — E1122 Type 2 diabetes mellitus with diabetic chronic kidney disease: Secondary | ICD-10-CM | POA: Diagnosis not present

## 2022-11-28 DIAGNOSIS — S81811A Laceration without foreign body, right lower leg, initial encounter: Secondary | ICD-10-CM | POA: Diagnosis not present

## 2022-11-28 DIAGNOSIS — L98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: Secondary | ICD-10-CM | POA: Insufficient documentation

## 2022-11-28 DIAGNOSIS — X58XXXA Exposure to other specified factors, initial encounter: Secondary | ICD-10-CM | POA: Diagnosis not present

## 2022-11-28 DIAGNOSIS — L0231 Cutaneous abscess of buttock: Secondary | ICD-10-CM | POA: Diagnosis not present

## 2022-11-28 DIAGNOSIS — N183 Chronic kidney disease, stage 3 unspecified: Secondary | ICD-10-CM | POA: Insufficient documentation

## 2022-11-28 DIAGNOSIS — T8131XA Disruption of external operation (surgical) wound, not elsewhere classified, initial encounter: Secondary | ICD-10-CM | POA: Diagnosis not present

## 2022-11-28 DIAGNOSIS — E11622 Type 2 diabetes mellitus with other skin ulcer: Secondary | ICD-10-CM | POA: Insufficient documentation

## 2022-11-28 DIAGNOSIS — Z8673 Personal history of transient ischemic attack (TIA), and cerebral infarction without residual deficits: Secondary | ICD-10-CM | POA: Diagnosis not present

## 2022-11-28 NOTE — Progress Notes (Addendum)
[name], [name] ([id number]) [id number] Page 1 of 8 Visit Report for 11/28/2022 Chief Complaint Document Details Patient Name: Date of Service: [name] [name] 11/28/2022 3:00 PM Medical Record Number: [id number] Patient Account Number: [id number] Date of Birth/Sex: Treating RN: 1932-06-19 (89 y.o. [name] [name] Primary Care Provider: [name] Other Clinician: Referring Provider: Treating Provider/Extender: Lavonia Drafts in Treatment: 2 Information Obtained from: Patient Chief Complaint Bilateral Gluteal Ulcers and right leg skin tear Electronic Signature(s) Signed: 11/28/2022 4:08:51 PM By: Allen Derry PA-C Previous Signature: 11/28/2022 3:27:16 PM Version By: Allen Derry PA-C Entered By: Allen Derry on 11/28/2022 13:08:51 -------------------------------------------------------------------------------- HPI Details Patient Name: Date of Service: [name] Russell [name] 11/28/2022 3:00 PM Medical Record Number: [id number] Patient Account Number: [id number] Date of Birth/Sex: Treating RN: 04/10/1932 (89 y.o. [name] [name] Primary Care Provider: [name] Other Clinician: Referring Provider: Treating Provider/Extender: Lavonia Drafts in Treatment: 2 History of Present Illness HPI Description: 11-10-2022 upon evaluation today patient presents for initial inspection here in our clinic concerning a wound which occurred unfortunately as a result of an abscess. This was the main right gluteal/sacral region. With that being said she noted this first around or just before July 12 when she was admitted to the hospital from the 12th through the 26. The wound was at that point drained and subsequently she thought that she was headed in the right direction. With that being said she ended up having to be admitted to the hospital August and this was due to a CHF exacerbation with a took 25 pounds of fluid off of her.  Subsequently she was then rehab for the wound at [facility] burn and the physician there apparently did an awesome job based on what I am seeing the wound actually looks like is doing quite well at this point. They were doing a fairly complex VAC with the small openings in multiple areas that were being backed as well at this point the good news is I do not think that he would need to be necessary based on what I am seeing. She does have home health coming out to do the wound VAC changes Monday, Wednesday, and Friday although get this scheduled going has been somewhat a concern due to the fact that the patient is been in and out of the hospital. Patient does have a history of having had a stroke in 2001 with some left-sided weakness. She also on September 08, 2022 had her A1c checked this was 9.8. She is a diabetic. Other than these 2 conditions she also has chronic kidney disease stage III, hypertension, and again obviously the wounds which were can be helping to manage at this point. 11-28-2022 upon evaluation today patient appears to be doing well currently in regard to her wound in the sacral area. Most of the surrounding wounds are actually closed and they look excellent. I am actually very pleased with where we stand. The only downside is she does have a skin tear which is still MIKA, GRIFFITTS ([id number]) [id number] Page 2 of 8 questionable whether it is going to resolved with a skin flap intact or if it is going skin flap did great and breakdown we will know more as it goes over the next couple of weeks. Nonetheless right now this actually does not look too bad definitely no signs of infection this is on the right lower leg. Otherwise the patient seems to be doing extremely well with regard to the wound  [name], [name] ([id number]) [id number] Page 1 of 8 Visit Report for 11/28/2022 Chief Complaint Document Details Patient Name: Date of Service: [name] [name] 11/28/2022 3:00 PM Medical Record Number: [id number] Patient Account Number: [id number] Date of Birth/Sex: Treating RN: 1932-06-19 (89 y.o. [name] [name] Primary Care Provider: [name] Other Clinician: Referring Provider: Treating Provider/Extender: Lavonia Drafts in Treatment: 2 Information Obtained from: Patient Chief Complaint Bilateral Gluteal Ulcers and right leg skin tear Electronic Signature(s) Signed: 11/28/2022 4:08:51 PM By: Allen Derry PA-C Previous Signature: 11/28/2022 3:27:16 PM Version By: Allen Derry PA-C Entered By: Allen Derry on 11/28/2022 13:08:51 -------------------------------------------------------------------------------- HPI Details Patient Name: Date of Service: [name] Russell [name] 11/28/2022 3:00 PM Medical Record Number: [id number] Patient Account Number: [id number] Date of Birth/Sex: Treating RN: 04/10/1932 (89 y.o. [name] [name] Primary Care Provider: [name] Other Clinician: Referring Provider: Treating Provider/Extender: Lavonia Drafts in Treatment: 2 History of Present Illness HPI Description: 11-10-2022 upon evaluation today patient presents for initial inspection here in our clinic concerning a wound which occurred unfortunately as a result of an abscess. This was the main right gluteal/sacral region. With that being said she noted this first around or just before July 12 when she was admitted to the hospital from the 12th through the 26. The wound was at that point drained and subsequently she thought that she was headed in the right direction. With that being said she ended up having to be admitted to the hospital August and this was due to a CHF exacerbation with a took 25 pounds of fluid off of her.  Subsequently she was then rehab for the wound at [facility] burn and the physician there apparently did an awesome job based on what I am seeing the wound actually looks like is doing quite well at this point. They were doing a fairly complex VAC with the small openings in multiple areas that were being backed as well at this point the good news is I do not think that he would need to be necessary based on what I am seeing. She does have home health coming out to do the wound VAC changes Monday, Wednesday, and Friday although get this scheduled going has been somewhat a concern due to the fact that the patient is been in and out of the hospital. Patient does have a history of having had a stroke in 2001 with some left-sided weakness. She also on September 08, 2022 had her A1c checked this was 9.8. She is a diabetic. Other than these 2 conditions she also has chronic kidney disease stage III, hypertension, and again obviously the wounds which were can be helping to manage at this point. 11-28-2022 upon evaluation today patient appears to be doing well currently in regard to her wound in the sacral area. Most of the surrounding wounds are actually closed and they look excellent. I am actually very pleased with where we stand. The only downside is she does have a skin tear which is still MIKA, GRIFFITTS ([id number]) [id number] Page 2 of 8 questionable whether it is going to resolved with a skin flap intact or if it is going skin flap did great and breakdown we will know more as it goes over the next couple of weeks. Nonetheless right now this actually does not look too bad definitely no signs of infection this is on the right lower leg. Otherwise the patient seems to be doing extremely well with regard to the wound  VAC I think that this is doing an awesome job I think putting some collagen down to the tunnel would be beneficial I think the black foam is still better than the white foam  to be honest. Electronic Signature(s) Signed: 11/28/2022 4:09:02 PM By: Allen Derry PA-C Entered By: Allen Derry on 11/28/2022 13:09:02 -------------------------------------------------------------------------------- Physical Exam Details Patient Name: Date of Service: EMMERIE, BATTAGLIA [name] 11/28/2022 3:00 PM Medical Record Number: [id number] Patient Account Number: [id number] Date of Birth/Sex: Treating RN: March 11, 1932 (89 y.o. [name] [name] Primary Care Provider: [name] Other Clinician: Referring Provider: Treating Provider/Extender: Lavonia Drafts in Treatment: 2 Constitutional Well-nourished and well-hydrated in no acute distress. Respiratory normal breathing without difficulty. Psychiatric this patient is able to make decisions and demonstrates good insight into disease process. Alert and Oriented x 3. pleasant and cooperative. Notes Upon inspection patient's wound bed actually showed signs of good granulation and epithelization at this point. Fortunately there does not appear to be any signs of infection locally or systemically. She appears to be really doing quite well. Electronic Signature(s) Signed: 11/28/2022 4:15:41 PM By: Allen Derry PA-C Entered By: Allen Derry on 11/28/2022 13:15:40 -------------------------------------------------------------------------------- Physician Orders Details Patient Name: Date of Service: TIZIANA, CISLO [name] 11/28/2022 3:00 PM Medical Record Number: [id number] Patient Account Number: [id number] Date of Birth/Sex: Treating RN: Mar 05, 1932 (89 y.o. [name] [name] Primary Care Provider: [name] Other Clinician: Referring Provider: Treating Provider/Extender: Lavonia Drafts in Treatment: 2 Verbal / Phone Orders: No Diagnosis [address] 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 Home Health Home Health Company: - [facility] for wound care. May utilize formulary equivalent dressing for wound treatment orders unless otherwise specified. Home Health Nurse may visit PRN to address patients wound care needs. Scheduled days for dressing changes to be completed; exception, patient has scheduled wound care visit that day. **Please direct any NON-WOUND related issues/requests for orders to patient's Primary Care Physician. **If current dressing causes regression in wound condition, may D/C ordered dressing product/s and apply Normal Saline Moist Dressing daily until next Wound Healing Center or Other MD appointment. **Notify Wound Healing Center of regression in wound condition at [phone]. Bathing/ Shower/ Hygiene No tub bath. Anesthetic (Use 'Patient Medications' Section for Anesthetic Order Entry) Lidocaine applied to wound bed Off-Loading Turn and reposition every 2 hours Other: - stay off bottom as much as possible Negative Pressure Wound Therapy Wound VAC settings at continuous pressure. Use foam to wound cavity. Please order WHITE foam to fill any tunnel/s and/or undermining when necessary. Change VAC dressing 3 X WEEK. Change canister as indicated when full. - Please continue to bridge wound vac to side of hip. Wound vac no longer needs to be bridged to drain sites. Home Health Nurse may d/c VAC for s/s of increased infection, significant wound regression, or  uncontrolled drainage. Notify Wound Healing Center at [phone]. Other: - PLEASE READ PRIOR TO VAC PLACEMENT please place PRISMA (may pack in dry) in tunnel and then also cutting a tail of black foam to :  Zetuvit Plus SILICONE BORDER Dressing 4x4 (in/in) 3 x  Per Week/30 Days Discharge Instructions: Please do not put silicone bordered dressings under wraps. Use non-bordered dressing only. 1. I would recommend currently that we have the patient continue to monitor for any signs of infection or worsening. Based on what I am seeing I do believe that we are making really good headway towards complete closure. 2. I am would recommend as well that the patient should continue with the wound VAC and regular use Prisma into the deeper tunnel area. 3. I am also can recommend that the patient should continue with the recommendation for alginate to the external wound I think this is actually doing quite well this is one of the small areas in the gluteal region surrounding that is the only 1 remaining open. 4. With regard to the new skin tear will use Xeroform over that area. We will see patient back for reevaluation in 1 week here in the clinic. If anything worsens or changes patient will contact our office for additional recommendations. Electronic Signature(s) Signed: 11/28/2022 4:16:26 PM By: Allen Derry PA-C Entered By: Allen Derry on 11/28/2022 13:16:26 -------------------------------------------------------------------------------- SuperBill Details Patient Name: Date of Service: ANGE, PUSKAS 11/28/2022 Medical Record Number: [id number] Patient Account Number: [id number] Date of Birth/Sex: Treating RN: 1932/06/10 (89 y.o. 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 E11.622 Type 2 diabetes mellitus with other skin ulcer I10 Essential  (primary) hypertension I50.42 Chronic combined systolic (congestive) and diastolic (congestive) heart failure N18.30 Chronic kidney disease, stage 3 unspecified Facility Procedures : CPT4 Code: 40981191 Description: 99214 - WOUND CARE VISIT-LEV 4 EST PT Modifier: Quantity: 1 Physician Procedures : CPT4 Code Description Modifier [id number] 99213 - WC PHYS LEVEL 3 - EST PT ICD-10 Diagnosis Description T81.31XA Disruption of external operation (surgical) wound, not elsewhere classified, initial encounter L02.31 Cutaneous abscess of buttock GRAYSON, WHITE ([id number]) [id number] Page [phone] 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 Drain sites no longer need wound vac Wound Treatment Wound #1 - Gluteus Wound Laterality: Right, Medial Prim Dressing: Gauze 1 x Per Day/15 Days ary Discharge Instructions: As directed: moistened with vashe or dakins, dressing daily until wound vac re applied Secondary Dressing: ABD Pad 5x9 (in/in) 1 x Per Day/15 Days Discharge Instructions: Cover with ABD pad Secured With: Medipore T - 61M Medipore H Soft Cloth Surgical T ape ape, 2x2 (in/yd) 1 x Per Day/15 Days Wound #5 - Gluteus Wound Laterality: Right, Distal Cleanser: Wound Cleanser 3 x Per Week/15 Days Discharge Instructions: Wash your hands with soap and water. Remove old dressing, discard into plastic bag and place into trash. Cleanse the wound with Wound Cleanser prior to applying a clean dressing using gauze sponges, not tissues or cotton balls. Do not scrub or use excessive force. Pat dry using gauze sponges, not tissue or cotton balls. Prim Dressing: Silvercel Small 2x2 (in/in) 3 x Per Week/15 Days ary Discharge Instructions: Apply Silvercel Small 2x2 (in/in) as instructed Secondary Dressing: tegaderm 3 x Per Week/15 Days Wound #6 - Lower Leg Wound Laterality: Right Cleanser: Wound Cleanser 3 x Per Week/30 Days Discharge Instructions: Wash your hands with soap and water. Remove old dressing, discard into plastic bag and place into trash. Cleanse the wound with Wound Cleanser prior to applying a clean dressing using gauze sponges, not tissues or cotton balls. Do not scrub or use excessive force. Pat dry using gauze sponges, not tissue or cotton balls. Prim Dressing: Xeroform 4x4-HBD (in/in) 3 x Per Week/30  Days ary Discharge Instructions: Apply Xeroform 4x4-HBD (in/in) as directed Secondary Dressing: (BORDER) Zetuvit Plus SILICONE BORDER Dressing 4x4 (in/in) 3 x Per Week/30 Days Discharge Instructions: Please do not put silicone bordered dressings under wraps. Use non-bordered dressing only. MARYELLA, ABOOD ([id number]) [id number] Page 4 of 8 Electronic Signature(s) Signed: 11/28/2022 4:05:41 PM By: Angelina Pih Signed: 11/28/2022 4:56:48 PM By: Allen Derry PA-C Entered By: Angelina Pih on 11/28/2022 12:45:03 -------------------------------------------------------------------------------- Problem List Details Patient Name: Date of Service: [name] Russell [name] 11/28/2022 3:00 PM Medical Record Number: [id number] Patient Account Number: [id number] Date of Birth/Sex: Treating RN: [birth year] (89 y.o. 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 without 11/10/2022 No Yes evidence of necrosis E11.622 Type 2 diabetes mellitus with other skin ulcer 11/10/2022 No Yes I10 Essential (primary) hypertension 11/10/2022 No Yes I50.42 Chronic combined systolic (congestive) and diastolic (congestive) heart failure 11/10/2022 No Yes N18.30 Chronic kidney disease, stage 3 unspecified 11/10/2022 No Yes S81.811A Laceration without foreign body, right lower leg, initial encounter 11/28/2022 No Yes Inactive Problems Resolved Problems Electronic Signature(s) Signed: 11/28/2022 4:08:34 PM By: Allen Derry PA-C Previous Signature: 11/28/2022  3:27:12 PM Version By: Kreg Shropshire, Grabiela C ([id number]) [id number] Page 5 of 8 Previous Signature: 11/28/2022 3:27:12 PM Version By: Allen Derry PA-C Entered By: Allen Derry on 11/28/2022 13:08:34 -------------------------------------------------------------------------------- Progress Note Details Patient Name: Date of Service: RENAYE, JANICKI [name] 11/28/2022 3:00 PM Medical Record Number: [id number] Patient Account Number: [id number] Date of Birth/Sex: Treating RN: 1932/09/04 (89 y.o. F)  [name], [name] ([id number]) [id number] Page 1 of 8 Visit Report for 11/28/2022 Chief Complaint Document Details Patient Name: Date of Service: [name] [name] 11/28/2022 3:00 PM Medical Record Number: [id number] Patient Account Number: [id number] Date of Birth/Sex: Treating RN: 1932-06-19 (89 y.o. [name] [name] Primary Care Provider: [name] Other Clinician: Referring Provider: Treating Provider/Extender: Lavonia Drafts in Treatment: 2 Information Obtained from: Patient Chief Complaint Bilateral Gluteal Ulcers and right leg skin tear Electronic Signature(s) Signed: 11/28/2022 4:08:51 PM By: Allen Derry PA-C Previous Signature: 11/28/2022 3:27:16 PM Version By: Allen Derry PA-C Entered By: Allen Derry on 11/28/2022 13:08:51 -------------------------------------------------------------------------------- HPI Details Patient Name: Date of Service: [name] Russell [name] 11/28/2022 3:00 PM Medical Record Number: [id number] Patient Account Number: [id number] Date of Birth/Sex: Treating RN: 04/10/1932 (89 y.o. [name] [name] Primary Care Provider: [name] Other Clinician: Referring Provider: Treating Provider/Extender: Lavonia Drafts in Treatment: 2 History of Present Illness HPI Description: 11-10-2022 upon evaluation today patient presents for initial inspection here in our clinic concerning a wound which occurred unfortunately as a result of an abscess. This was the main right gluteal/sacral region. With that being said she noted this first around or just before July 12 when she was admitted to the hospital from the 12th through the 26. The wound was at that point drained and subsequently she thought that she was headed in the right direction. With that being said she ended up having to be admitted to the hospital August and this was due to a CHF exacerbation with a took 25 pounds of fluid off of her.  Subsequently she was then rehab for the wound at [facility] burn and the physician there apparently did an awesome job based on what I am seeing the wound actually looks like is doing quite well at this point. They were doing a fairly complex VAC with the small openings in multiple areas that were being backed as well at this point the good news is I do not think that he would need to be necessary based on what I am seeing. She does have home health coming out to do the wound VAC changes Monday, Wednesday, and Friday although get this scheduled going has been somewhat a concern due to the fact that the patient is been in and out of the hospital. Patient does have a history of having had a stroke in 2001 with some left-sided weakness. She also on September 08, 2022 had her A1c checked this was 9.8. She is a diabetic. Other than these 2 conditions she also has chronic kidney disease stage III, hypertension, and again obviously the wounds which were can be helping to manage at this point. 11-28-2022 upon evaluation today patient appears to be doing well currently in regard to her wound in the sacral area. Most of the surrounding wounds are actually closed and they look excellent. I am actually very pleased with where we stand. The only downside is she does have a skin tear which is still MIKA, GRIFFITTS ([id number]) [id number] Page 2 of 8 questionable whether it is going to resolved with a skin flap intact or if it is going skin flap did great and breakdown we will know more as it goes over the next couple of weeks. Nonetheless right now this actually does not look too bad definitely no signs of infection this is on the right lower leg. Otherwise the patient seems to be doing extremely well with regard to the wound  [name], [name] ([id number]) [id number] Page 1 of 8 Visit Report for 11/28/2022 Chief Complaint Document Details Patient Name: Date of Service: [name] [name] 11/28/2022 3:00 PM Medical Record Number: [id number] Patient Account Number: [id number] Date of Birth/Sex: Treating RN: 1932-06-19 (89 y.o. [name] [name] Primary Care Provider: [name] Other Clinician: Referring Provider: Treating Provider/Extender: Lavonia Drafts in Treatment: 2 Information Obtained from: Patient Chief Complaint Bilateral Gluteal Ulcers and right leg skin tear Electronic Signature(s) Signed: 11/28/2022 4:08:51 PM By: Allen Derry PA-C Previous Signature: 11/28/2022 3:27:16 PM Version By: Allen Derry PA-C Entered By: Allen Derry on 11/28/2022 13:08:51 -------------------------------------------------------------------------------- HPI Details Patient Name: Date of Service: [name] Russell [name] 11/28/2022 3:00 PM Medical Record Number: [id number] Patient Account Number: [id number] Date of Birth/Sex: Treating RN: 04/10/1932 (89 y.o. [name] [name] Primary Care Provider: [name] Other Clinician: Referring Provider: Treating Provider/Extender: Lavonia Drafts in Treatment: 2 History of Present Illness HPI Description: 11-10-2022 upon evaluation today patient presents for initial inspection here in our clinic concerning a wound which occurred unfortunately as a result of an abscess. This was the main right gluteal/sacral region. With that being said she noted this first around or just before July 12 when she was admitted to the hospital from the 12th through the 26. The wound was at that point drained and subsequently she thought that she was headed in the right direction. With that being said she ended up having to be admitted to the hospital August and this was due to a CHF exacerbation with a took 25 pounds of fluid off of her.  Subsequently she was then rehab for the wound at [facility] burn and the physician there apparently did an awesome job based on what I am seeing the wound actually looks like is doing quite well at this point. They were doing a fairly complex VAC with the small openings in multiple areas that were being backed as well at this point the good news is I do not think that he would need to be necessary based on what I am seeing. She does have home health coming out to do the wound VAC changes Monday, Wednesday, and Friday although get this scheduled going has been somewhat a concern due to the fact that the patient is been in and out of the hospital. Patient does have a history of having had a stroke in 2001 with some left-sided weakness. She also on September 08, 2022 had her A1c checked this was 9.8. She is a diabetic. Other than these 2 conditions she also has chronic kidney disease stage III, hypertension, and again obviously the wounds which were can be helping to manage at this point. 11-28-2022 upon evaluation today patient appears to be doing well currently in regard to her wound in the sacral area. Most of the surrounding wounds are actually closed and they look excellent. I am actually very pleased with where we stand. The only downside is she does have a skin tear which is still MIKA, GRIFFITTS ([id number]) [id number] Page 2 of 8 questionable whether it is going to resolved with a skin flap intact or if it is going skin flap did great and breakdown we will know more as it goes over the next couple of weeks. Nonetheless right now this actually does not look too bad definitely no signs of infection this is on the right lower leg. Otherwise the patient seems to be doing extremely well with regard to the wound

## 2022-11-28 NOTE — Progress Notes (Addendum)
Photos Wound Measurements Length: (cm) Width: (cm) Depth: (cm) Area: (cm) Volume: (cm) 0 % Reduction in Area: 100% 0 % Reduction in Volume: 100% 0 Epithelialization: None 0 Tunneling: No 0 Undermining: No Wound Description [name] [name] [name], [name] [name] [name] ([id number]) Classification: Full Thickness Without Exposed Support Structures Exudate Amount: None Present [id number] Page 12 of 16 Foul Odor After Cleansing: No Slough/Fibrino No Wound Bed Granulation Amount: None Present (0%) Exposed Structure Necrotic Amount: None Present (0%) Fat Layer (Subcutaneous Tissue) Exposed: No Electronic Signature(s) Signed: 11/28/2022  4:05:41 PM By: [name] [name] [name] Entered By: [name] [name] [name] on 11/28/2022 15:23:49 -------------------------------------------------------------------------------- Wound Assessment Details Patient Name: Date of Service: [name] [name] [name], [name] [name] [name] [name] [name]. 11/28/2022 3:00 PM Medical Record Number: [id number] Patient Account Number: [id number] Date of Birth/Sex: Treating RN: 06-01-1932 (89 y.o. [name] [name] [name] Primary Care [name] [name] [name]: [name] Other Clinician: Referring [name] [name] [name]: Treating [name] [name] [name]/Extender: [name] [name] [name] in Treatment: 2 Wound Status Wound Number: 4 Primary Open Surgical Wound Etiology: Wound Location: Left Gluteus Wound Open Wounding Event: Surgical Injury Status: Date Acquired: 09/08/2022 Comorbid Cataracts, Anemia, Asthma, Congestive Heart Failure, Weeks Of Treatment: 2 History: Hypertension, Type II Diabetes, Osteoarthritis Clustered Wound: No Photos Wound Measurements Length: (cm) Width: (cm) Depth: (cm) Area: (cm) Volume: (cm) 0 % Reduction in Area: 100% 0 % Reduction in Volume: 100% 0 Epithelialization: Large (67-100%) 0 Tunneling: No 0 Undermining: No Wound Description Classification: Full Thickness Without Exposed Suppor Exudate Amount: None Present t Structures Foul Odor After Cleansing: No Slough/Fibrino No Wound Bed Granulation Amount: None Present (0%) Exposed Structure Necrotic Amount: None Present (0%) Fat Layer (Subcutaneous Tissue) Exposed: No [name] [name] [name], [name] [name] [name] ([id number]) [id number] Page 13 of 16 Electronic Signature(s) Signed: 11/28/2022 4:05:41 PM By: [name] [name] [name] Entered By: [name] [name] [name] on 11/28/2022 15:24:22 -------------------------------------------------------------------------------- Wound Assessment Details Patient Name: Date of Service: [name] [name] [name], [name] [name] [name] 11/28/2022 3:00 PM Medical Record Number: [id number] Patient Account Number: [id number] Date of Birth/Sex: Treating RN: [birth year]  (89 y.o. [name] [name] [name] Primary Care [name] [name] [name]: [name] Other Clinician: Referring [name] [name] [name]: Treating [name] [name] [name]/Extender: [name] [name] [name] in Treatment: 2 Wound Status Wound Number: 5 Primary Open Surgical Wound Etiology: Wound Location: Right, Distal Gluteus Wound Open Wounding Event: Surgical Injury Status: Date Acquired: 09/08/2022 Comorbid Cataracts, Anemia, Asthma, Congestive Heart Failure, Weeks Of Treatment: 2 History: Hypertension, Type II Diabetes, Osteoarthritis Clustered Wound: No Photos Wound Measurements Length: (cm) 0.2 Width: (cm) 0.5 Depth: (cm) 0.1 Area: (cm) 0.079 Volume: (cm) 0.008 % Reduction in Area: 52.1% % Reduction in Volume: 87.9% Epithelialization: Small (1-33%) Tunneling: No Undermining: No Wound Description Classification: Full Thickness Without Exposed Suppor Exudate Amount: Medium Exudate Type: Serosanguineous Exudate Color: red, brown t Structures Foul Odor After Cleansing: No Slough/Fibrino No Wound Bed Granulation Amount: Large (67-100%) Exposed Structure Granulation Quality: Red Fat Layer (Subcutaneous Tissue) Exposed: Yes Necrotic Amount: None Present (0%) Treatment Notes Wound #5 (Gluteus) Wound Laterality: Right, Distal Cleanser Wound Cleanser [name] [name] [name] ([id number]) [id number] Page 14 of 16 Discharge Instruction: Wash your hands with soap and water. Remove old dressing, discard into plastic bag and place into trash. Cleanse the wound with Wound Cleanser prior to applying a clean dressing using gauze sponges, not tissues or cotton balls. Do not scrub or use excessive force. Pat dry using gauze sponges, not tissue or cotton balls. Peri-Wound Care Topical Primary Dressing Silvercel Small 2x2 (in/in) Discharge Instruction: Apply Silvercel Small 2x2 (in/in) as instructed Secondary Dressing tegaderm Secured With Compression Wrap Compression Stockings Add-Ons Electronic  Signature(s) Signed: 11/28/2022 4:05:41 PM By: [name] [name] [name] Entered  any number of wounds) []  - 0 Wound Tracing (instead of photographs) []  - 0 Simple Wound Measurement - one wound X- 3 5 Complex Wound Measurement - multiple wounds INTERVENTIONS - Wound Dressings X - Small Wound Dressing one or multiple wounds 2 10 X- 1 15 Medium Wound Dressing one or multiple wounds []  - 0 Large Wound Dressing one or multiple wounds []  - 0 Application of Medications - topical []  - 0 Application of Medications - injection INTERVENTIONS - Miscellaneous []  - 0 External ear exam []  - 0 Specimen Collection (cultures, biopsies, blood, body fluids, etc.) []  - 0 Specimen(s) / Culture(s) sent or taken to Lab for analysis [name] [name] [name] ([id number]) [id number] Page 3 of 16 []  - 0 Patient Transfer (multiple staff / Nurse, adult / Similar devices) []  - 0 Simple Staple / Suture removal (25 or less) []  - 0 Complex Staple / Suture removal (26 or more) []  - 0 Hypo / Hyperglycemic Management (close monitor of Blood Glucose) []  - 0 Ankle / Brachial Index (ABI) - do not check if billed separately X- 1 5 Vital Signs Has the patient been seen at the hospital within the last three years: Yes Total Score: 140 Level Of Care: New/Established - Level 4 Electronic Signature(s) Signed: 11/28/2022 4:05:41 PM By: [name] [name] [name] Entered By: [name] [name] [name] on 11/28/2022 16:02:59 -------------------------------------------------------------------------------- Encounter Discharge Information Details Patient Name: Date of Service: [name] Russell [name] [name] [name]. 11/28/2022 3:00 PM Medical Record Number: [id number] Patient Account Number: [id number] Date of Birth/Sex: Treating RN: 1932-10-14 (89 y.o. [name] [name] [name] Primary Care Lailany Enoch: [name] Other  Clinician: Referring Chana Lindstrom: Treating Alexiana Laverdure/Extender: [name] [name] [name] in Treatment: 2 Encounter Discharge Information Items Discharge Condition: Stable Ambulatory Status: Wheelchair Discharge Destination: Home Transportation: Private Auto Accompanied By: daughter in law Schedule Follow-up Appointment: Yes Clinical Summary of Care: Electronic Signature(s) Signed: 11/28/2022 4:04:10 PM By: [name] [name] [name] Entered By: [name] [name] [name] on 11/28/2022 16:04:10 -------------------------------------------------------------------------------- Lower Extremity Assessment Details Patient Name: Date of Service: KAELY, HOLLAN 11/28/2022 3:00 PM Medical Record Number: [id number] Patient Account Number: [id number] Date of Birth/Sex: Treating RN: May 24, 1932 (89 y.o. [name] [name] [name] Primary Care Brandyn Thien: [name] Other Clinician: KAIRAH, LEONI ([id number]) [id number] Page 4 of 16 Referring Krissi Willaims: Treating Nakira Litzau/Extender: [name] [name] [name] in Treatment: 2 Edema Assessment Assessed: [Left: No] [Right: No] Edema: [Left: Ye] [Right: s] Vascular Assessment Pulses: Dorsalis Pedis Doppler Audible: [Right:Yes] Posterior Tibial Doppler Audible: [Right:Yes] Extremity colors, hair growth, and conditions: Extremity Color: [Right:Normal] Hair Growth on Extremity: [Right:No] Temperature of Extremity: [Right:Warm < 3 seconds] Toe Nail Assessment Left: Right: Thick: No Discolored: No Deformed: No Improper Length and Hygiene: No Electronic Signature(s) Signed: 11/28/2022 4:05:41 PM By: [name] [name] [name] Entered By: [name] [name] [name] on 11/28/2022 15:29:45 -------------------------------------------------------------------------------- Multi Wound Chart Details Patient Name: Date of Service: [name] Russell [name] [name] [name]. 11/28/2022 3:00 PM Medical Record Number: [id number] Patient Account Number: [id number] Date of Birth/Sex:  Treating RN: 1932/08/06 (89 y.o. [name] [name] [name] Primary Care Shalena Ezzell: [name] Other Clinician: Referring Lizzie Cokley: Treating Naitik Hermann/Extender: [name] [name] [name] in Treatment: 2 Vital Signs Height(in): 66 Pulse(bpm): 76 Weight(lbs): 191 Blood Pressure(mmHg): 131/68 Body Mass Index(BMI): 30.8 Temperature(F): 97.6 Respiratory Rate(breaths/min): 18 [1:Photos:] NORISSA, BARTEE ([id number]) [1:Right, Medial Gluteus Wound Location: Gradually Appeared Wounding Event: Abscess Primary Etiology: Cataracts, Anemia, Asthma, Comorbid History: Congestive Heart Failure, Hypertension, Type II Diabetes, Osteoarthritis 09/08/2022 Date Acquired: 2 Weeks  of Treatment: Open Wound Status: No Wound Recurrence: 4x5x1.6 Measurements L x W x D (  related to pressure, friction, moisture, and shear Goals: Patient will remain free from development of additional pressure ulcers Date Initiated: 11/10/2022 Target Resolution Date: 01/05/2023 Goal Status: Active Patient will remain free of pressure ulcers Date Initiated: 11/10/2022 Target Resolution Date: 01/05/2023 Goal Status: Active Patient/caregiver will verbalize risk factors for pressure ulcer development Date Initiated: 11/10/2022 Date Inactivated: 11/10/2022 Target Resolution Date: 11/10/2022 Goal Status:  Met Patient/caregiver will verbalize understanding of pressure ulcer management Date Initiated: 11/10/2022 Date Inactivated: 11/10/2022 Target Resolution Date: 11/10/2022 Goal Status: Met Interventions: Assess: immobility, friction, shearing, incontinence upon admission and as needed Assess offloading mechanisms upon admission and as needed Assess potential for pressure ulcer upon admission and as needed Provide education on pressure ulcers Notes: TIYA, SCHRUPP ([id number]) [id number] Page 7 of 16 Nursing Diagnoses: Impaired tissue integrity Knowledge deficit related to ulceration/compromised skin integrity Goals: Ulcer/skin breakdown will have a volume reduction of 30% by week 4 Date Initiated: 11/10/2022 Target Resolution Date: 12/08/2022 Goal Status: Active Ulcer/skin breakdown will have a volume reduction of 50% by week 8 Date Initiated: 11/10/2022 Target Resolution Date: 01/05/2023 Goal Status: Active Ulcer/skin breakdown will have a volume reduction of 80% by week 12 Date Initiated: 11/10/2022 Target Resolution Date: 02/02/2023 Goal Status: Active Ulcer/skin breakdown will heal within 14 weeks Date Initiated: 11/10/2022 Target Resolution Date: 02/16/2023 Goal Status: Active Interventions: Assess patient/caregiver ability to obtain necessary supplies Assess patient/caregiver ability to perform ulcer/skin care regimen upon admission and as needed Assess ulceration(s) every visit Provide education on ulcer and skin care Notes: Negative Pressure Wound Therapy Nursing Diagnoses: Potential for imbalanced nutrition Impaired tissue integrity Goals: Patient/caregiver agrees to and verbalizes understanding of need to use Date Initiated: 11/10/2022 Date Inactivated: 11/10/2022 Target Resolution Date: 11/10/2022 Goal Status: Met Promote granulation tissue Date Initiated: 11/10/2022 Target Resolution Date: 01/05/2023 Goal Status:  Active Nutritional supplements and/or vitamins as prescribed Date Initiated: 11/10/2022 Target Resolution Date: 01/05/2023 Goal Status: Active Interventions: Assess drainage (amount and color) Assess patient nutrition upon admission and as needed per policy Assess patient understanding of disease process and management Assess patient/caregiver ability to obtain necessary supplies Assess wound bed for efficacy of treatment Monitor and protect skin around the wound Provide education on nutrition Treatment Activities: Education provided on Nutrition : 11/10/2022 Notes: Electronic Signature(s) Signed: 11/28/2022 4:03:19 PM By: [name] [name] [name] Entered By: [name] [name] [name] on 11/28/2022 16:03:19 Mikael Spray ([id number]) [id number] Page 8 of 16 -------------------------------------------------------------------------------- Pain Assessment Details Patient Name: Date of Service: ALEXANDERIA, GORBY 11/28/2022 3:00 PM Medical Record Number: [id number] Patient Account Number: [id number] Date of Birth/Sex: Treating RN: [birth year] (89 y.o. [name] [name] [name] Primary Care Eleena Grater: [name] Other Clinician: Referring Debbra Digiulio: Treating Kaya Pottenger/Extender: [name] [name] [name] in Treatment: 2 Active Problems Location of Pain Severity and Description of Pain Patient Has Paino No Site Locations Rate the pain. Current Pain Level: 0 Pain Management and Medication Current Pain Management: Electronic Signature(s) Signed: 11/28/2022 4:05:41 PM By: [name] [name] [name] Entered By: [name] [name] [name] on 11/28/2022 15:01:07 -------------------------------------------------------------------------------- Patient/Caregiver Education Details Patient Name: Date of Service: Erich Montane 10/1/2024andnbsp3:00 PM Medical Record Number: [id number] Patient Account Number: [id number] Date of Birth/Gender: Treating RN: 06-15-1932 (89 y.o. [name] [name] [name] Primary  Care Physician: [name] Other Clinician: Referring Physician: Treating Physician/Extender: [name] [name] [name] in Treatment: 2 Education Assessment Education Provided To: Patient Education Topics Provided Wound/Skin Impairment: Handouts: Caring for Your Ulcer Methods: Explain/Verbal Responses: State content correctly CHANTIA, AMALFITANO ([id number]) [id number] Page 9 of 16 Electronic  Signature(s) Signed: 11/28/2022 4:05:41 PM By: [name] [name] [name] Entered By: [name] [name] [name] on 11/28/2022 16:03:28 -------------------------------------------------------------------------------- Wound Assessment Details Patient Name: Date of Service: [name] [name] [name], [name] [name] [name] [name] [name] [name]. 11/28/2022 3:00 PM Medical Record Number: [id number] Patient Account Number: [id number] Date of Birth/Sex: Treating RN: 12-Dec-1932 (89 y.o. [name] [name] [name] Primary Care Praveen Coia: [name] Other Clinician: Referring Dionis Autry: Treating Alleta Avery/Extender: [name] [name] [name] in Treatment: 2 Wound Status Wound Number: 1 Primary Abscess Etiology: Wound Location: Right, Medial Gluteus Wound Open Wounding Event: Gradually Appeared Status: Date Acquired: 09/08/2022 Comorbid Cataracts, Anemia, Asthma, Congestive Heart Failure, Weeks Of Treatment: 2 History: Hypertension, Type II Diabetes, Osteoarthritis Clustered Wound: No Photos Wound Measurements Length: (cm) 4 Width: (cm) 5 Depth: (cm) 1.6 Area: (cm) 15.708 Volume: (cm) 25.133 % Reduction in Area: 45.3% % Reduction in Volume: 62% Epithelialization: None Tunneling: Yes Location 1 Position (o'clock): 5 Maximum Distance: (cm) 4.1 Location 2 Undermining: No Wound Description Classification: Full Thickness Without Exposed Suppor Exudate Amount: Medium Exudate Type: Serosanguineous Exudate Color: red, brown t Structures Foul Odor After Cleansing: No Slough/Fibrino Yes Wound Bed Granulation Amount: Large (67-100%)  Exposed Structure Granulation Quality: Red Fat Layer (Subcutaneous Tissue) Exposed: Yes Necrotic Amount: Small (1-33%) Necrotic Quality: [address], [place] [name] ([id number]) [id number].pdf Page 10 of 16 Treatment Notes Wound #1 (Gluteus) Wound Laterality: Right, Medial Cleanser Peri-Wound Care Topical Primary Dressing Gauze Discharge Instruction: As directed: moistened with vashe or dakins, dressing daily until wound vac re applied Secondary Dressing ABD Pad 5x9 (in/in) Discharge Instruction: Cover with ABD pad Secured With Medipore T - 59M Medipore H Soft Cloth Surgical T ape ape, 2x2 (in/yd) Compression Wrap Compression Stockings Add-Ons Electronic Signature(s) Signed: 11/28/2022 4:05:41 PM By: [name] [name] [name] Entered By: [name] [name] [name] on 11/28/2022 15:22:30 -------------------------------------------------------------------------------- Wound Assessment Details Patient Name: Date of Service: [name] [name] [name], [name] [name] [name] 11/28/2022 3:00 PM Medical Record Number: [id number] Patient Account Number: [id number] Date of Birth/Sex: Treating RN: Jul 05, 1932 (89 y.o. [name] [name] [name] Primary Care Gelsey Amyx: [name] Other Clinician: Referring Griffen Frayne: Treating Alven Alverio/Extender: [name] [name] [name] in Treatment: 2 Wound Status Wound Number: 2 Primary Open Surgical Wound Etiology: Wound Location: Right, Lateral Gluteus Wound Open Wounding Event: Surgical Injury Status: Date Acquired: 09/08/2022 Comorbid Cataracts, Anemia, Asthma, Congestive Heart Failure, Weeks Of Treatment: 2 History: Hypertension, Type II Diabetes, Osteoarthritis Clustered Wound: No Photos QUINTELLA, MURA ([id number]) [id number] Page 11 of 16 Wound Measurements Length: (cm) Width: (cm) Depth: (cm) Area: (cm) Volume: (cm) 0 % Reduction in Area: 100% 0 % Reduction in Volume: 100% 0 Epithelialization: Large (67-100%) 0 Tunneling:  No 0 Undermining: No Wound Description Classification: Full Thickness Without Exposed Suppor Exudate Amount: None Present t Structures Foul Odor After Cleansing: No Slough/Fibrino No Wound Bed Granulation Amount: None Present (0%) Exposed Structure Necrotic Amount: None Present (0%) Fat Layer (Subcutaneous Tissue) Exposed: No Electronic Signature(s) Signed: 11/28/2022 4:05:41 PM By: [name] [name] [name] Entered By: [name] [name] [name] on 11/28/2022 15:23:15 -------------------------------------------------------------------------------- Wound Assessment Details Patient Name: Date of Service: [name] [name] [name], [name] [name] [name] 11/28/2022 3:00 PM Medical Record Number: [id number] Patient Account Number: [id number] Date of Birth/Sex: Treating RN: Apr 17, 1932 (89 y.o. [name] [name] [name] Primary Care Laelia Angelo: [name] Other Clinician: Referring Brenley Priore: Treating Sadik Piascik/Extender: [name] [name] [name] in Treatment: 2 Wound Status Wound Number: 3 Primary Open Surgical Wound Etiology: Wound Location: Right, Distal Back Wound Open Wounding Event: Surgical Injury Status: Date Acquired: 09/14/2022 Comorbid Cataracts, Anemia, Asthma, Congestive Heart Failure, Weeks Of Treatment: 2 History: Hypertension, Type II Diabetes, Osteoarthritis Clustered Wound: No  Photos Wound Measurements Length: (cm) Width: (cm) Depth: (cm) Area: (cm) Volume: (cm) 0 % Reduction in Area: 100% 0 % Reduction in Volume: 100% 0 Epithelialization: None 0 Tunneling: No 0 Undermining: No Wound Description [name] [name] [name], [name] [name] [name] ([id number]) Classification: Full Thickness Without Exposed Support Structures Exudate Amount: None Present [id number] Page 12 of 16 Foul Odor After Cleansing: No Slough/Fibrino No Wound Bed Granulation Amount: None Present (0%) Exposed Structure Necrotic Amount: None Present (0%) Fat Layer (Subcutaneous Tissue) Exposed: No Electronic Signature(s) Signed: 11/28/2022  4:05:41 PM By: [name] [name] [name] Entered By: [name] [name] [name] on 11/28/2022 15:23:49 -------------------------------------------------------------------------------- Wound Assessment Details Patient Name: Date of Service: [name] [name] [name], [name] [name] [name] [name] [name]. 11/28/2022 3:00 PM Medical Record Number: [id number] Patient Account Number: [id number] Date of Birth/Sex: Treating RN: 06-01-1932 (89 y.o. [name] [name] [name] Primary Care [name] [name] [name]: [name] Other Clinician: Referring [name] [name] [name]: Treating [name] [name] [name]/Extender: [name] [name] [name] in Treatment: 2 Wound Status Wound Number: 4 Primary Open Surgical Wound Etiology: Wound Location: Left Gluteus Wound Open Wounding Event: Surgical Injury Status: Date Acquired: 09/08/2022 Comorbid Cataracts, Anemia, Asthma, Congestive Heart Failure, Weeks Of Treatment: 2 History: Hypertension, Type II Diabetes, Osteoarthritis Clustered Wound: No Photos Wound Measurements Length: (cm) Width: (cm) Depth: (cm) Area: (cm) Volume: (cm) 0 % Reduction in Area: 100% 0 % Reduction in Volume: 100% 0 Epithelialization: Large (67-100%) 0 Tunneling: No 0 Undermining: No Wound Description Classification: Full Thickness Without Exposed Suppor Exudate Amount: None Present t Structures Foul Odor After Cleansing: No Slough/Fibrino No Wound Bed Granulation Amount: None Present (0%) Exposed Structure Necrotic Amount: None Present (0%) Fat Layer (Subcutaneous Tissue) Exposed: No [name] [name] [name], [name] [name] [name] ([id number]) [id number].pdf Page 13 of 16 Electronic Signature(s) Signed: 11/28/2022 4:05:41 PM By: [name] [name] [name] Entered By: [name] [name] [name] on 11/28/2022 15:24:22 -------------------------------------------------------------------------------- Wound Assessment Details Patient Name: Date of Service: [name] [name] [name], [name] [name] [name] 11/28/2022 3:00 PM Medical Record Number: [id number] Patient Account Number: [id number] Date of Birth/Sex: Treating RN: [birth year]  (89 y.o. [name] [name] [name] Primary Care [name] [name] [name]: [name] Other Clinician: Referring [name] [name] [name]: Treating [name] [name] [name]/Extender: [name] [name] [name] in Treatment: 2 Wound Status Wound Number: 5 Primary Open Surgical Wound Etiology: Wound Location: Right, Distal Gluteus Wound Open Wounding Event: Surgical Injury Status: Date Acquired: 09/08/2022 Comorbid Cataracts, Anemia, Asthma, Congestive Heart Failure, Weeks Of Treatment: 2 History: Hypertension, Type II Diabetes, Osteoarthritis Clustered Wound: No Photos Wound Measurements Length: (cm) 0.2 Width: (cm) 0.5 Depth: (cm) 0.1 Area: (cm) 0.079 Volume: (cm) 0.008 % Reduction in Area: 52.1% % Reduction in Volume: 87.9% Epithelialization: Small (1-33%) Tunneling: No Undermining: No Wound Description Classification: Full Thickness Without Exposed Suppor Exudate Amount: Medium Exudate Type: Serosanguineous Exudate Color: red, brown t Structures Foul Odor After Cleansing: No Slough/Fibrino No Wound Bed Granulation Amount: Large (67-100%) Exposed Structure Granulation Quality: Red Fat Layer (Subcutaneous Tissue) Exposed: Yes Necrotic Amount: None Present (0%) Treatment Notes Wound #5 (Gluteus) Wound Laterality: Right, Distal Cleanser Wound Cleanser [name] [name] [name] ([id number]) [id number] Page 14 of 16 Discharge Instruction: Wash your hands with soap and water. Remove old dressing, discard into plastic bag and place into trash. Cleanse the wound with Wound Cleanser prior to applying a clean dressing using gauze sponges, not tissues or cotton balls. Do not scrub or use excessive force. Pat dry using gauze sponges, not tissue or cotton balls. Peri-Wound Care Topical Primary Dressing Silvercel Small 2x2 (in/in) Discharge Instruction: Apply Silvercel Small 2x2 (in/in) as instructed Secondary Dressing tegaderm Secured With Compression Wrap Compression Stockings Add-Ons Electronic  Signature(s) Signed: 11/28/2022 4:05:41 PM By: [name] [name] [name] Entered  Signature(s) Signed: 11/28/2022 4:05:41 PM By: [name] [name] [name] Entered By: [name] [name] [name] on 11/28/2022 16:03:28 -------------------------------------------------------------------------------- Wound Assessment Details Patient Name: Date of Service: [name] [name] [name], [name] [name] [name] [name] [name] [name]. 11/28/2022 3:00 PM Medical Record Number: [id number] Patient Account Number: [id number] Date of Birth/Sex: Treating RN: 12-Dec-1932 (89 y.o. [name] [name] [name] Primary Care Praveen Coia: [name] Other Clinician: Referring Dionis Autry: Treating Alleta Avery/Extender: [name] [name] [name] in Treatment: 2 Wound Status Wound Number: 1 Primary Abscess Etiology: Wound Location: Right, Medial Gluteus Wound Open Wounding Event: Gradually Appeared Status: Date Acquired: 09/08/2022 Comorbid Cataracts, Anemia, Asthma, Congestive Heart Failure, Weeks Of Treatment: 2 History: Hypertension, Type II Diabetes, Osteoarthritis Clustered Wound: No Photos Wound Measurements Length: (cm) 4 Width: (cm) 5 Depth: (cm) 1.6 Area: (cm) 15.708 Volume: (cm) 25.133 % Reduction in Area: 45.3% % Reduction in Volume: 62% Epithelialization: None Tunneling: Yes Location 1 Position (o'clock): 5 Maximum Distance: (cm) 4.1 Location 2 Undermining: No Wound Description Classification: Full Thickness Without Exposed Suppor Exudate Amount: Medium Exudate Type: Serosanguineous Exudate Color: red, brown t Structures Foul Odor After Cleansing: No Slough/Fibrino Yes Wound Bed Granulation Amount: Large (67-100%)  Exposed Structure Granulation Quality: Red Fat Layer (Subcutaneous Tissue) Exposed: Yes Necrotic Amount: Small (1-33%) Necrotic Quality: [address], [place] [name] ([id number]) [id number].pdf Page 10 of 16 Treatment Notes Wound #1 (Gluteus) Wound Laterality: Right, Medial Cleanser Peri-Wound Care Topical Primary Dressing Gauze Discharge Instruction: As directed: moistened with vashe or dakins, dressing daily until wound vac re applied Secondary Dressing ABD Pad 5x9 (in/in) Discharge Instruction: Cover with ABD pad Secured With Medipore T - 59M Medipore H Soft Cloth Surgical T ape ape, 2x2 (in/yd) Compression Wrap Compression Stockings Add-Ons Electronic Signature(s) Signed: 11/28/2022 4:05:41 PM By: [name] [name] [name] Entered By: [name] [name] [name] on 11/28/2022 15:22:30 -------------------------------------------------------------------------------- Wound Assessment Details Patient Name: Date of Service: [name] [name] [name], [name] [name] [name] 11/28/2022 3:00 PM Medical Record Number: [id number] Patient Account Number: [id number] Date of Birth/Sex: Treating RN: Jul 05, 1932 (89 y.o. [name] [name] [name] Primary Care Gelsey Amyx: [name] Other Clinician: Referring Griffen Frayne: Treating Alven Alverio/Extender: [name] [name] [name] in Treatment: 2 Wound Status Wound Number: 2 Primary Open Surgical Wound Etiology: Wound Location: Right, Lateral Gluteus Wound Open Wounding Event: Surgical Injury Status: Date Acquired: 09/08/2022 Comorbid Cataracts, Anemia, Asthma, Congestive Heart Failure, Weeks Of Treatment: 2 History: Hypertension, Type II Diabetes, Osteoarthritis Clustered Wound: No Photos QUINTELLA, MURA ([id number]) [id number] Page 11 of 16 Wound Measurements Length: (cm) Width: (cm) Depth: (cm) Area: (cm) Volume: (cm) 0 % Reduction in Area: 100% 0 % Reduction in Volume: 100% 0 Epithelialization: Large (67-100%) 0 Tunneling:  No 0 Undermining: No Wound Description Classification: Full Thickness Without Exposed Suppor Exudate Amount: None Present t Structures Foul Odor After Cleansing: No Slough/Fibrino No Wound Bed Granulation Amount: None Present (0%) Exposed Structure Necrotic Amount: None Present (0%) Fat Layer (Subcutaneous Tissue) Exposed: No Electronic Signature(s) Signed: 11/28/2022 4:05:41 PM By: [name] [name] [name] Entered By: [name] [name] [name] on 11/28/2022 15:23:15 -------------------------------------------------------------------------------- Wound Assessment Details Patient Name: Date of Service: [name] [name] [name], [name] [name] [name] 11/28/2022 3:00 PM Medical Record Number: [id number] Patient Account Number: [id number] Date of Birth/Sex: Treating RN: Apr 17, 1932 (89 y.o. [name] [name] [name] Primary Care Laelia Angelo: [name] Other Clinician: Referring Brenley Priore: Treating Sadik Piascik/Extender: [name] [name] [name] in Treatment: 2 Wound Status Wound Number: 3 Primary Open Surgical Wound Etiology: Wound Location: Right, Distal Back Wound Open Wounding Event: Surgical Injury Status: Date Acquired: 09/14/2022 Comorbid Cataracts, Anemia, Asthma, Congestive Heart Failure, Weeks Of Treatment: 2 History: Hypertension, Type II Diabetes, Osteoarthritis Clustered Wound: No  Photos Wound Measurements Length: (cm) Width: (cm) Depth: (cm) Area: (cm) Volume: (cm) 0 % Reduction in Area: 100% 0 % Reduction in Volume: 100% 0 Epithelialization: None 0 Tunneling: No 0 Undermining: No Wound Description [name] [name] [name], [name] [name] [name] ([id number]) Classification: Full Thickness Without Exposed Support Structures Exudate Amount: None Present [id number] Page 12 of 16 Foul Odor After Cleansing: No Slough/Fibrino No Wound Bed Granulation Amount: None Present (0%) Exposed Structure Necrotic Amount: None Present (0%) Fat Layer (Subcutaneous Tissue) Exposed: No Electronic Signature(s) Signed: 11/28/2022  4:05:41 PM By: [name] [name] [name] Entered By: [name] [name] [name] on 11/28/2022 15:23:49 -------------------------------------------------------------------------------- Wound Assessment Details Patient Name: Date of Service: [name] [name] [name], [name] [name] [name] [name] [name]. 11/28/2022 3:00 PM Medical Record Number: [id number] Patient Account Number: [id number] Date of Birth/Sex: Treating RN: 06-01-1932 (89 y.o. [name] [name] [name] Primary Care [name] [name] [name]: [name] Other Clinician: Referring [name] [name] [name]: Treating [name] [name] [name]/Extender: [name] [name] [name] in Treatment: 2 Wound Status Wound Number: 4 Primary Open Surgical Wound Etiology: Wound Location: Left Gluteus Wound Open Wounding Event: Surgical Injury Status: Date Acquired: 09/08/2022 Comorbid Cataracts, Anemia, Asthma, Congestive Heart Failure, Weeks Of Treatment: 2 History: Hypertension, Type II Diabetes, Osteoarthritis Clustered Wound: No Photos Wound Measurements Length: (cm) Width: (cm) Depth: (cm) Area: (cm) Volume: (cm) 0 % Reduction in Area: 100% 0 % Reduction in Volume: 100% 0 Epithelialization: Large (67-100%) 0 Tunneling: No 0 Undermining: No Wound Description Classification: Full Thickness Without Exposed Suppor Exudate Amount: None Present t Structures Foul Odor After Cleansing: No Slough/Fibrino No Wound Bed Granulation Amount: None Present (0%) Exposed Structure Necrotic Amount: None Present (0%) Fat Layer (Subcutaneous Tissue) Exposed: No [name] [name] [name], [name] [name] [name] ([id number]) [id number] Page 13 of 16 Electronic Signature(s) Signed: 11/28/2022 4:05:41 PM By: [name] [name] [name] Entered By: [name] [name] [name] on 11/28/2022 15:24:22 -------------------------------------------------------------------------------- Wound Assessment Details Patient Name: Date of Service: [name] [name] [name], [name] [name] [name] 11/28/2022 3:00 PM Medical Record Number: [id number] Patient Account Number: [id number] Date of Birth/Sex: Treating RN: [birth year]  (89 y.o. [name] [name] [name] Primary Care [name] [name] [name]: [name] Other Clinician: Referring [name] [name] [name]: Treating [name] [name] [name]/Extender: [name] [name] [name] in Treatment: 2 Wound Status Wound Number: 5 Primary Open Surgical Wound Etiology: Wound Location: Right, Distal Gluteus Wound Open Wounding Event: Surgical Injury Status: Date Acquired: 09/08/2022 Comorbid Cataracts, Anemia, Asthma, Congestive Heart Failure, Weeks Of Treatment: 2 History: Hypertension, Type II Diabetes, Osteoarthritis Clustered Wound: No Photos Wound Measurements Length: (cm) 0.2 Width: (cm) 0.5 Depth: (cm) 0.1 Area: (cm) 0.079 Volume: (cm) 0.008 % Reduction in Area: 52.1% % Reduction in Volume: 87.9% Epithelialization: Small (1-33%) Tunneling: No Undermining: No Wound Description Classification: Full Thickness Without Exposed Suppor Exudate Amount: Medium Exudate Type: Serosanguineous Exudate Color: red, brown t Structures Foul Odor After Cleansing: No Slough/Fibrino No Wound Bed Granulation Amount: Large (67-100%) Exposed Structure Granulation Quality: Red Fat Layer (Subcutaneous Tissue) Exposed: Yes Necrotic Amount: None Present (0%) Treatment Notes Wound #5 (Gluteus) Wound Laterality: Right, Distal Cleanser Wound Cleanser [name] [name] [name] ([id number]) [id number] Page 14 of 16 Discharge Instruction: Wash your hands with soap and water. Remove old dressing, discard into plastic bag and place into trash. Cleanse the wound with Wound Cleanser prior to applying a clean dressing using gauze sponges, not tissues or cotton balls. Do not scrub or use excessive force. Pat dry using gauze sponges, not tissue or cotton balls. Peri-Wound Care Topical Primary Dressing Silvercel Small 2x2 (in/in) Discharge Instruction: Apply Silvercel Small 2x2 (in/in) as instructed Secondary Dressing tegaderm Secured With Compression Wrap Compression Stockings Add-Ons Electronic  Signature(s) Signed: 11/28/2022 4:05:41 PM By: [name] [name] [name] Entered

## 2022-11-29 ENCOUNTER — Telehealth: Payer: Self-pay | Admitting: Cardiology

## 2022-11-29 NOTE — Telephone Encounter (Signed)
Pt c/o BP issue: STAT if pt c/o blurred vision, one-sided weakness or slurred speech  1. What are your last 5 BP readings?  114/55  128/55 148/58 156/70 129/59  2. Are you having any other symptoms (ex. Dizziness, headache, blurred vision, passed out)? Slight headache   3. What is your BP issue?  Patient's daughter-in-law states that they are unsure if they should hold bp medication or be seen in office due to the lower readings.

## 2022-11-29 NOTE — Telephone Encounter (Signed)
Call to daughter. She states patient is asymptomatic.  But then she state has so much fatigue and weakness. Recent adjustment made with BP. She then wants to add issues chronic issues and gout.  Advised this is to discuss with PCP regarding gout and toe pain.  She then states  BP dropped in the 90's after her morning meds one time.  She ask should she bring her mother in to discuss.  Please advise

## 2022-11-30 NOTE — Telephone Encounter (Signed)
Call to daughter and scheduled for 10/30 with Dr Servando Salina for HTN

## 2022-11-30 NOTE — Telephone Encounter (Signed)
Patient's daughter states she is returning a call regarding having patient worked in with Dr. Servando Salina. She states she would prefer a Thursday if possible.

## 2022-11-30 NOTE — Telephone Encounter (Signed)
Call to daughter and LM that Dr Servando Salina wants F/U in 4 weeks.  My chart message sent as well to call back for scheduling

## 2022-12-05 ENCOUNTER — Other Ambulatory Visit: Payer: Self-pay

## 2022-12-05 NOTE — Progress Notes (Signed)
Error

## 2022-12-12 ENCOUNTER — Encounter: Payer: Medicare Other | Admitting: Physician Assistant

## 2022-12-12 DIAGNOSIS — E11622 Type 2 diabetes mellitus with other skin ulcer: Secondary | ICD-10-CM | POA: Diagnosis not present

## 2022-12-12 NOTE — Progress Notes (Signed)
I think putting some collagen down to the tunnel would be beneficial I think the black foam is still better than the white foam to be honest. 12-12-2022 upon evaluation today patient appears to  be doing pretty well currently in regard to her wound. She has been tolerating the wound VAC without complication and seems to be making good progress here. Fortunately I do not see any signs of infection locally or systemically which is great news and in general I do believe that we are making excellent headway towards closure which is great news as well. No fevers, chills, nausea, vomiting, or diarrhea. Electronic Signature(s) Signed: 12/12/2022 5:54:00 PM By: Allen Derry PA-C Entered By: Allen Derry on 12/12/2022 17:54:00 -------------------------------------------------------------------------------- Physical Exam Details Patient Name: Date of Service: [name] [name], [name] [name]. 12/12/2022 3:00 PM Medical Record Number: [id number] Patient Account Number: [id number] Date of Birth/Sex: Treating RN: May 12, 1932 (89 y.o. [name] [name] Primary Care Provider: [name] Other Clinician: Referring Provider: Treating Provider/Extender: Lavonia Drafts in Treatment: 4 Constitutional Well-nourished and well-hydrated in no acute distress. Respiratory normal breathing without difficulty. Psychiatric this patient is able to make decisions and demonstrates good insight into disease process. Alert and Oriented x 3. pleasant and cooperative. Notes Upon inspection patient's wound bed actually showed signs of good granulation and epithelization at this point. Fortunately I do not see any signs of worsening overall I do believe that the patient is making headway towards closure and I am very pleased in that regard. I think the wound VAC is doing a great job here. Electronic Signature(s) Signed: 12/12/2022 5:54:19 PM By: Allen Derry PA-C Entered By: Allen Derry on 12/12/2022 17:54:19 -------------------------------------------------------------------------------- Physician Orders Details Patient Name: Date of Service: [name] Russell [name] 12/12/2022 3:00 PM Medical Record Number:  [id number] Patient Account Number: [id number] Date of Birth/Sex: Treating RN: April 06, 1932 (89 y.o. 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 Home Health Home Health Company: - [facility] for wound care. May utilize formulary equivalent dressing for wound treatment orders unless otherwise specified. Home Health Nurse may visit PRN to address patients wound care needs. Scheduled days for dressing changes to be completed; exception, patient has scheduled wound care visit that day. **Please direct any NON-WOUND related issues/requests for orders to patient's Primary Care Physician. **If current dressing causes regression in wound condition, may D/C ordered dressing product/s and apply Normal Saline Moist Dressing daily until next Wound Healing Center or Other MD appointment. **Notify Wound Healing Center of regression in wound condition at [phone]. Bathing/ Shower/ Hygiene No tub bath. Anesthetic (Use 'Patient Medications' Section for Anesthetic Order Entry) Lidocaine applied to wound  bed Off-Loading Turn and reposition every 2 hours Other: - stay off bottom as much as possible Negative Pressure Wound Therapy Wound VAC settings at continuous pressure. Use foam to wound cavity. Please order  bottom as much as possible Negative Pressure Wound Therapy: Wound VAC settings at continuous pressure. Use foam to wound cavity. Please order WHITE foam to fill any tunnel/s and/or undermining when necessary. Change VAC dressing 3 X WEEK. Change canister as indicated when full. - Please continue to bridge wound vac to side of hip. Wound vac no longer needs to be bridged to drain sites. Home Health Nurse may d/c VAC for s/s of increased infection, significant wound regression, or uncontrolled drainage. Notify Wound Healing Center at [phone]. Other: - PLEASE READ PRIOR TO VAC PLACEMENT please place PRISMA (may pack in dry) in tunnel at 4 O'clock and also straight in for area of depth and : then also cutting a tail of black foam to also pack into tunnel area at 4 O'clock and for depth area in middle of wound, wound vac only needs to be placed on main wound from abscess. Drain sites no longer need wound vac WOUND #1: - Gluteus Wound Laterality: Right, Medial Prim Dressing: Gauze 1 x Per Day/15 Days ary Discharge Instructions: As directed: moistened with vashe or dakins, dressing daily until wound vac re applied Secondary Dressing: ABD Pad 5x9 (in/in) 1 x Per Day/15 Days Discharge Instructions: Cover with ABD pad Secured With: Medipore T - 98M Medipore H Soft Cloth Surgical T ape ape, 2x2 (in/yd) 1 x Per Day/15 Days WOUND #6: - Lower Leg Wound Laterality: Right Cleanser: Wound Cleanser 3 x Per Week/30 Days Discharge Instructions: Wash your hands with soap and water. Remove old dressing, discard into plastic bag and place into trash. Cleanse the wound with Wound Cleanser prior to applying a clean dressing using gauze sponges, not tissues or cotton balls. Do not scrub or use excessive force. Pat dry using gauze sponges, not tissue or cotton balls. Prim Dressing: Xeroform 4x4-HBD (in/in) 3 x Per Week/30  Days ary Discharge Instructions: Apply Xeroform 4x4-HBD (in/in) as directed Secondary Dressing: (BORDER) Zetuvit Plus SILICONE BORDER Dressing 4x4 (in/in) 3 x Per Week/30 Days Discharge Instructions: Please do not put silicone bordered dressings under wraps. Use non-bordered dressing only. 1. I would recommend that we have the patient continue to monitor for any signs of infection or worsening. Based on what I see I do believe that we are making really good headway here. 2. I would recommend we continue with Xeroform along with the Zetuvit on the leg. 3. With regard to the sacral wound we will continue with the wound VAC at this point. We will see patient back for reevaluation in 2 weeks here in the clinic. If anything worsens or changes patient will contact our office for additional recommendations. [name] [name], [name] [name] ([id number]) [id number] Page 7 of 7 Electronic Signature(s) Signed: 12/12/2022 5:54:45 PM By: Allen Derry PA-C Entered By: Allen Derry on 12/12/2022 17:54:45 -------------------------------------------------------------------------------- SuperBill Details Patient Name: Date of Service: [name] Russell [name] 12/12/2022 Medical Record Number: [id number] Patient Account Number: [id number] Date of Birth/Sex: Treating RN: Aug 08, 1932 (89 y.o. 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 Use foam to wound cavity. Please order WHITE foam to fill any tunnel/s and/or undermining when necessary. Change VAC dressing 3 X WEEK. Change canister as indicated when full. - Please continue to bridge wound vac to side of hip. Wound vac no longer needs to be bridged to drain sites. Home Health Nurse may d/c VAC for s/s of increased infection, significant wound regression, or uncontrolled drainage. Notify Wound Healing Center at [phone]. Other: - PLEASE READ PRIOR TO VAC PLACEMENT please place PRISMA (may pack in dry) in tunnel at 4 O'clock and also straight in for area of depth and : then also cutting a tail of black foam to also pack into tunnel area at 4 O'clock and for depth area in middle of wound, wound vac only needs to be placed on main wound from abscess. Drain sites no longer need wound vac WOUND #1: - Gluteus Wound Laterality: Right, Medial Prim Dressing: Gauze 1 x Per Day/15 Days ary Discharge Instructions: As directed: moistened with vashe or dakins, dressing daily until wound vac re applied Secondary Dressing: ABD Pad 5x9 (in/in) 1 x Per Day/15 Days Discharge Instructions: Cover with ABD pad Secured With: Medipore T - 98M Medipore H Soft Cloth Surgical T ape ape, 2x2 (in/yd) 1 x Per Day/15 Days WOUND #6: - Lower Leg Wound Laterality: Right Cleanser: Wound Cleanser 3 x Per Week/30 Days Discharge Instructions: Wash your hands with soap and water. Remove old dressing, discard into plastic bag and place into trash. Cleanse the wound with Wound Cleanser prior to applying a clean dressing using gauze sponges, not tissues or cotton balls. Do not scrub or use excessive force. Pat dry using gauze sponges, not tissue or cotton balls. Prim Dressing: Xeroform 4x4-HBD (in/in) 3 x Per Week/30  Days ary Discharge Instructions: Apply Xeroform 4x4-HBD (in/in) as directed Secondary Dressing: (BORDER) Zetuvit Plus SILICONE BORDER Dressing 4x4 (in/in) 3 x Per Week/30 Days Discharge Instructions: Please do not put silicone bordered dressings under wraps. Use non-bordered dressing only. 1. I would recommend that we have the patient continue to monitor for any signs of infection or worsening. Based on what I see I do believe that we are making really good headway here. 2. I would recommend we continue with Xeroform along with the Zetuvit on the leg. 3. With regard to the sacral wound we will continue with the wound VAC at this point. We will see patient back for reevaluation in 2 weeks here in the clinic. If anything worsens or changes patient will contact our office for additional recommendations. [name] [name], [name] [name] ([id number]) [id number] Page 7 of 7 Electronic Signature(s) Signed: 12/12/2022 5:54:45 PM By: Allen Derry PA-C Entered By: Allen Derry on 12/12/2022 17:54:45 -------------------------------------------------------------------------------- SuperBill Details Patient Name: Date of Service: [name] Russell [name] 12/12/2022 Medical Record Number: [id number] Patient Account Number: [id number] Date of Birth/Sex: Treating RN: Aug 08, 1932 (89 y.o. 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 Change VAC dressing 3 X WEEK. Change canister as indicated when full. - Please continue to bridge wound vac to side of hip. Wound vac no longer needs to be bridged to drain sites. Home Health Nurse may d/c VAC for s/s of increased infection, significant wound regression, or uncontrolled drainage. Notify Wound Healing Center at [phone]. Other: - PLEASE READ PRIOR TO VAC PLACEMENT please place PRISMA (may pack in dry) in tunnel at 4 O'clock and also straight in for area of : depth and then also cutting a tail of black foam to also pack into tunnel area at 4 O'clock and for depth area in middle of wound, wound vac only needs to be placed on main wound from abscess. Drain sites no longer need wound vac Wound Treatment Wound #1 - Gluteus Wound Laterality: Right, Medial Prim Dressing: Gauze 1 x Per Day/15 Days ary Discharge Instructions: As directed: moistened with vashe or dakins, dressing daily until wound vac re applied Secondary Dressing: ABD Pad 5x9 (in/in) 1 x Per Day/15 Days Discharge Instructions: Cover with ABD pad Secured With: Medipore T - 45M Medipore H Soft Cloth Surgical T ape ape, 2x2 (in/yd) 1 x Per Day/15 Days Wound #6 - Lower Leg Wound Laterality: Right Cleanser: Wound Cleanser 3 x Per Week/30 Days Discharge Instructions: Wash your hands with soap and water. Remove old dressing, discard into plastic bag and place into trash. Cleanse the wound with Wound Cleanser prior to applying a clean dressing using gauze sponges, not tissues or cotton balls. Do not scrub or use excessive force. Pat dry using gauze sponges, not tissue or cotton balls. Prim Dressing: Xeroform 4x4-HBD (in/in) 3 x Per Week/30  Days ary Discharge Instructions: Apply Xeroform 4x4-HBD (in/in) as directed Secondary Dressing: (BORDER) Zetuvit Plus SILICONE BORDER Dressing 4x4 (in/in) 3 x Per Week/30 Days Discharge Instructions: Please do not put silicone bordered dressings under wraps. Use non-bordered dressing only. Electronic Signature(s) Signed: 12/12/2022 4:19:17 PM By: Angelina Pih Signed: 12/12/2022 6:10:25 PM By: Allen Derry PA-C Entered By: Angelina Pih on 12/12/2022 16:03:16 Mikael Spray ([id number]) [id number] Page 4 of 7 -------------------------------------------------------------------------------- Problem List Details Patient Name: Date of Service: [name] [name], [name] [name] 12/12/2022 3:00 PM Medical Record Number: [id number] Patient Account Number: [id number] Date of Birth/Sex: Treating RN: 03/18/1932 (89 y.o. 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 without 11/10/2022 No Yes evidence of necrosis E11.622 Type 2 diabetes mellitus with other skin ulcer 11/10/2022 No Yes I10 Essential (primary) hypertension 11/10/2022 No Yes I50.42 Chronic combined systolic (congestive) and diastolic (congestive) heart failure 11/10/2022 No Yes N18.30 Chronic kidney disease, stage 3 unspecified 11/10/2022 No Yes S81.811A Laceration without foreign body, right lower leg, initial encounter 11/28/2022 No Yes Inactive Problems Resolved Problems Electronic Signature(s) Signed: 12/12/2022 3:32:16 PM By: Allen Derry PA-C Entered By: Allen Derry on  12/12/2022 15:32:16 Mikael Spray ([id number]) [id number] Page 5 of 7 -------------------------------------------------------------------------------- Progress Note Details Patient Name: Date of Service: [name] [name], [name] [name] 12/12/2022 3:00 PM Medical Record Number: [id number] Patient Account Number: [id number] Date of Birth/Sex: Treating RN: 1933-02-01 (89 y.o. [name] [name] Primary Care Provider: [name] Other  WHITE foam to fill any tunnel/s and/or undermining when necessary. Change VAC dressing 3 X WEEK. Change canister as indicated when full. - Please continue to bridge wound vac to side of hip. Wound vac no longer needs to be bridged to drain sites. Home Health Nurse may d/c VAC for s/s of increased infection, significant wound regression, or uncontrolled drainage. Notify Wound Healing Center at [phone]. Other: - PLEASE READ PRIOR TO VAC PLACEMENT please place PRISMA (may pack in dry) in tunnel at 4 O'clock and also straight in for area of : depth and then also cutting a tail of black foam to also pack into tunnel area at 4 O'clock and for depth area in middle of wound, wound vac only needs to be placed on main wound from abscess. Drain sites no longer need wound vac Wound Treatment Wound #1 - Gluteus Wound Laterality: Right, Medial Prim Dressing: Gauze 1 x Per Day/15 Days ary Discharge Instructions: As directed: moistened with vashe or dakins, dressing daily until wound vac re applied Secondary Dressing: ABD Pad 5x9 (in/in) 1 x Per Day/15 Days Discharge Instructions: Cover with ABD pad Secured With: Medipore T - 45M Medipore H Soft Cloth Surgical T ape ape, 2x2 (in/yd) 1 x Per Day/15 Days Wound #6 - Lower Leg Wound Laterality: Right Cleanser: Wound Cleanser 3 x Per Week/30 Days Discharge Instructions: Wash your hands with soap and water. Remove old dressing, discard into plastic bag and place into trash. Cleanse the wound with Wound Cleanser prior to applying a clean dressing using gauze sponges, not tissues or cotton balls. Do not scrub or use excessive force. Pat dry using gauze sponges, not tissue or cotton balls. Prim Dressing: Xeroform 4x4-HBD (in/in) 3 x Per Week/30  Days ary Discharge Instructions: Apply Xeroform 4x4-HBD (in/in) as directed Secondary Dressing: (BORDER) Zetuvit Plus SILICONE BORDER Dressing 4x4 (in/in) 3 x Per Week/30 Days Discharge Instructions: Please do not put silicone bordered dressings under wraps. Use non-bordered dressing only. Electronic Signature(s) Signed: 12/12/2022 4:19:17 PM By: Angelina Pih Signed: 12/12/2022 6:10:25 PM By: Allen Derry PA-C Entered By: Angelina Pih on 12/12/2022 16:03:16 Mikael Spray ([id number]) [id number] Page 4 of 7 -------------------------------------------------------------------------------- Problem List Details Patient Name: Date of Service: [name] [name], [name] [name] 12/12/2022 3:00 PM Medical Record Number: [id number] Patient Account Number: [id number] Date of Birth/Sex: Treating RN: 03/18/1932 (89 y.o. 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 without 11/10/2022 No Yes evidence of necrosis E11.622 Type 2 diabetes mellitus with other skin ulcer 11/10/2022 No Yes I10 Essential (primary) hypertension 11/10/2022 No Yes I50.42 Chronic combined systolic (congestive) and diastolic (congestive) heart failure 11/10/2022 No Yes N18.30 Chronic kidney disease, stage 3 unspecified 11/10/2022 No Yes S81.811A Laceration without foreign body, right lower leg, initial encounter 11/28/2022 No Yes Inactive Problems Resolved Problems Electronic Signature(s) Signed: 12/12/2022 3:32:16 PM By: Allen Derry PA-C Entered By: Allen Derry on  12/12/2022 15:32:16 Mikael Spray ([id number]) [id number] Page 5 of 7 -------------------------------------------------------------------------------- Progress Note Details Patient Name: Date of Service: [name] [name], [name] [name] 12/12/2022 3:00 PM Medical Record Number: [id number] Patient Account Number: [id number] Date of Birth/Sex: Treating RN: 1933-02-01 (89 y.o. [name] [name] Primary Care Provider: [name] Other  WHITE foam to fill any tunnel/s and/or undermining when necessary. Change VAC dressing 3 X WEEK. Change canister as indicated when full. - Please continue to bridge wound vac to side of hip. Wound vac no longer needs to be bridged to drain sites. Home Health Nurse may d/c VAC for s/s of increased infection, significant wound regression, or uncontrolled drainage. Notify Wound Healing Center at [phone]. Other: - PLEASE READ PRIOR TO VAC PLACEMENT please place PRISMA (may pack in dry) in tunnel at 4 O'clock and also straight in for area of : depth and then also cutting a tail of black foam to also pack into tunnel area at 4 O'clock and for depth area in middle of wound, wound vac only needs to be placed on main wound from abscess. Drain sites no longer need wound vac Wound Treatment Wound #1 - Gluteus Wound Laterality: Right, Medial Prim Dressing: Gauze 1 x Per Day/15 Days ary Discharge Instructions: As directed: moistened with vashe or dakins, dressing daily until wound vac re applied Secondary Dressing: ABD Pad 5x9 (in/in) 1 x Per Day/15 Days Discharge Instructions: Cover with ABD pad Secured With: Medipore T - 45M Medipore H Soft Cloth Surgical T ape ape, 2x2 (in/yd) 1 x Per Day/15 Days Wound #6 - Lower Leg Wound Laterality: Right Cleanser: Wound Cleanser 3 x Per Week/30 Days Discharge Instructions: Wash your hands with soap and water. Remove old dressing, discard into plastic bag and place into trash. Cleanse the wound with Wound Cleanser prior to applying a clean dressing using gauze sponges, not tissues or cotton balls. Do not scrub or use excessive force. Pat dry using gauze sponges, not tissue or cotton balls. Prim Dressing: Xeroform 4x4-HBD (in/in) 3 x Per Week/30  Days ary Discharge Instructions: Apply Xeroform 4x4-HBD (in/in) as directed Secondary Dressing: (BORDER) Zetuvit Plus SILICONE BORDER Dressing 4x4 (in/in) 3 x Per Week/30 Days Discharge Instructions: Please do not put silicone bordered dressings under wraps. Use non-bordered dressing only. Electronic Signature(s) Signed: 12/12/2022 4:19:17 PM By: Angelina Pih Signed: 12/12/2022 6:10:25 PM By: Allen Derry PA-C Entered By: Angelina Pih on 12/12/2022 16:03:16 Mikael Spray ([id number]) [id number] Page 4 of 7 -------------------------------------------------------------------------------- Problem List Details Patient Name: Date of Service: [name] [name], [name] [name] 12/12/2022 3:00 PM Medical Record Number: [id number] Patient Account Number: [id number] Date of Birth/Sex: Treating RN: 03/18/1932 (89 y.o. 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 without 11/10/2022 No Yes evidence of necrosis E11.622 Type 2 diabetes mellitus with other skin ulcer 11/10/2022 No Yes I10 Essential (primary) hypertension 11/10/2022 No Yes I50.42 Chronic combined systolic (congestive) and diastolic (congestive) heart failure 11/10/2022 No Yes N18.30 Chronic kidney disease, stage 3 unspecified 11/10/2022 No Yes S81.811A Laceration without foreign body, right lower leg, initial encounter 11/28/2022 No Yes Inactive Problems Resolved Problems Electronic Signature(s) Signed: 12/12/2022 3:32:16 PM By: Allen Derry PA-C Entered By: Allen Derry on  12/12/2022 15:32:16 Mikael Spray ([id number]) [id number] Page 5 of 7 -------------------------------------------------------------------------------- Progress Note Details Patient Name: Date of Service: [name] [name], [name] [name] 12/12/2022 3:00 PM Medical Record Number: [id number] Patient Account Number: [id number] Date of Birth/Sex: Treating RN: 1933-02-01 (89 y.o. [name] [name] Primary Care Provider: [name] Other  bottom as much as possible Negative Pressure Wound Therapy: Wound VAC settings at continuous pressure. Use foam to wound cavity. Please order WHITE foam to fill any tunnel/s and/or undermining when necessary. Change VAC dressing 3 X WEEK. Change canister as indicated when full. - Please continue to bridge wound vac to side of hip. Wound vac no longer needs to be bridged to drain sites. Home Health Nurse may d/c VAC for s/s of increased infection, significant wound regression, or uncontrolled drainage. Not[facility] at [phone]. Other: - PLEASE READ PRIOR TO VAC PLACEMENT please place PRISMA (may pack in dry) in tunnel at 4 O'clock and also straight in for area of depth and : then also cutting a tail of black foam to also pack into tunnel area at 4 O'clock and for depth area in middle of wound, wound vac only needs to be placed on main wound from abscess. Drain sites no longer need wound vac WOUND #1: - Gluteus Wound Laterality: Right, Medial Prim Dressing: Gauze 1 x Per Day/15 Days ary Discharge Instructions: As directed: moistened with vashe or dakins, dressing daily until wound vac re applied Secondary Dressing: ABD Pad 5x9 (in/in) 1 x Per Day/15 Days Discharge Instructions: Cover with ABD pad Secured With: Medipore T - 98M Medipore H Soft Cloth Surgical T ape ape, 2x2 (in/yd) 1 x Per Day/15 Days WOUND #6: - Lower Leg Wound Laterality: Right Cleanser: Wound Cleanser 3 x Per Week/30 Days Discharge Instructions: Wash your hands with soap and water. Remove old dressing, discard into plastic bag and place into trash. Cleanse the wound with Wound Cleanser prior to applying a clean dressing using gauze sponges, not tissues or cotton balls. Do not scrub or use excessive force. Pat dry using gauze sponges, not tissue or cotton balls. Prim Dressing: Xeroform 4x4-HBD (in/in) 3 x Per Week/30  Days ary Discharge Instructions: Apply Xeroform 4x4-HBD (in/in) as directed Secondary Dressing: (BORDER) Zetuvit Plus SILICONE BORDER Dressing 4x4 (in/in) 3 x Per Week/30 Days Discharge Instructions: Please do not put silicone bordered dressings under wraps. Use non-bordered dressing only. 1. I would recommend that we have the patient continue to monitor for any signs of infection or worsening. Based on what I see I do believe that we are making really good headway here. 2. I would recommend we continue with Xeroform along with the Zetuvit on the leg. 3. With regard to the sacral wound we will continue with the wound VAC at this point. We will see patient back for reevaluation in 2 weeks here in the clinic. If anything worsens or changes patient will contact our office for additional recommendations. [name] [name], [name] [name] ([id number]) [id number] Page 7 of 7 Electronic Signature(s) Signed: 12/12/2022 5:54:45 PM By: Allen Derry PA-C Entered By: Allen Derry on 12/12/2022 17:54:45 -------------------------------------------------------------------------------- SuperBill Details Patient Name: Date of Service: [name] Russell [name] 12/12/2022 Medical Record Number: [id number] Patient Account Number: [id number] Date of Birth/Sex: Treating RN: Aug 08, 1932 (89 y.o. 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

## 2022-12-12 NOTE — Progress Notes (Signed)
[name] [name], [name] [name] ([id number]) [id number] Page 1 of 12 Visit Report for 12/12/2022 Arrival Information Details Patient Name: Date of Service: [name] [name], [name] [name] 12/12/2022 3:00 PM Medical Record Number: [id number] Patient Account Number: [id number] Date of Birth/Sex: Treating RN: 1932-09-23 (89 y.o. [name] [name] Primary Care [name] [name]: [name] [name] Other Clinician: Referring [name] [name]: Treating [name] [name]/Extender: [name] [name] in Treatment: 4 Visit Information History Since Last Visit Added or deleted any medications: No Patient Arrived: Wheel Chair Any new allergies or adverse reactions: No Arrival Time: 15:03 Had a fall or experienced change in No Accompanied By: daughter in law activities of daily living that may affect Transfer Assistance: EasyPivot Patient Lift risk of falls: Patient Identification Verified: Yes Hospitalized since last visit: No Secondary Verification Process Completed: Yes Has Dressing in Place as Prescribed: Yes Patient Has Alerts: Yes Pain Present Now: No Patient Alerts: Patient on Blood Thinner type 2 diabetic Stroke 2021, R sided weak Electronic Signature(s) Signed: 12/12/2022 4:19:17 PM By: [name] [name] Entered By: [name] [name] on 12/12/2022 12:04:13 -------------------------------------------------------------------------------- Clinic Level of Care Assessment Details Patient Name: Date of Service: [name] [name] 12/12/2022 3:00 PM Medical Record Number: [id number] Patient Account Number: [id number] Date of Birth/Sex: Treating RN: [birth year] (89 y.o. [name] [name] Primary Care Airam Heidecker: [name] [name] Other Clinician: Referring Evamae Rowen: Treating Firman Petrow/Extender: [name] [name] in Treatment: 4 Clinic Level of Care Assessment Items TOOL 4 Quantity Score []  - 0 Use when only an EandM is performed on FOLLOW-UP visit ASSESSMENTS - Nursing Assessment /  Reassessment X- 1 10 Reassessment of Co-morbidities (includes updates in patient status) X- 1 5 Reassessment of Adherence to Treatment Plan ASSESSMENTS - Wound and Skin A ssessment / Reassessment []  - 0 Simple Wound Assessment / Reassessment - one wound [name] [name], [name] [name] ([id number]) [phone].pdf Page 2 of 12 X- 2 5 Complex Wound Assessment / Reassessment - multiple wounds []  - 0 Dermatologic / Skin Assessment (not related to wound area) ASSESSMENTS - Focused Assessment []  - 0 Circumferential Edema Measurements - multi extremities []  - 0 Nutritional Assessment / Counseling / Intervention []  - 0 Lower Extremity Assessment (monofilament, tuning fork, pulses) []  - 0 Peripheral Arterial Disease Assessment (using hand held doppler) ASSESSMENTS - Ostomy and/or Continence Assessment and Care []  - 0 Incontinence Assessment and Management []  - 0 Ostomy Care Assessment and Management (repouching, etc.) PROCESS - Coordination of Care X - Simple Patient / Family Education for ongoing care 1 15 []  - 0 Complex (extensive) Patient / Family Education for ongoing care X- 1 10 Staff obtains Chiropractor, Records, T Results / Process Orders est []  - 0 Staff telephones HHA, Nursing Homes / Clarify orders / etc []  - 0 Routine Transfer to another Facility (non-emergent condition) []  - 0 Routine Hospital Admission (non-emergent condition) []  - 0 New Admissions / Manufacturing engineer / Ordering NPWT Apligraf, etc. , []  - 0 Emergency Hospital Admission (emergent condition) X- 1 10 Simple Discharge Coordination []  - 0 Complex (extensive) Discharge Coordination PROCESS - Special Needs []  - 0 Pediatric / Minor Patient Management []  - 0 Isolation Patient Management []  - 0 Hearing / Language / Visual special needs []  - 0 Assessment of Community assistance (transportation, D/C planning, etc.) []  - 0 Additional assistance / Altered mentation []  - 0 Support  Surface(s) Assessment (bed, cushion, seat, etc.) INTERVENTIONS - Wound Cleansing / Measurement []  - 0 Simple Wound Cleansing - one wound X- 2 5 Complex Wound Cleansing - multiple wounds X- 1 5 Wound  43M Medipore H Soft Cloth Surgical T ape ape, 2x2 (in/yd) Compression Wrap Compression Stockings Add-Ons Electronic Signature(s) Signed: 12/12/2022 4:19:17 PM By: [name] [name] Entered By: [name] [name] on 12/12/2022 12:42:09 Mikael Spray ([id number]) [id number] Page 10 of 12 -------------------------------------------------------------------------------- Wound Assessment Details Patient Name: Date of Service: [name], [name] 12/12/2022 3:00 PM Medical Record Number: [id number] Patient Account Number: [id number] Date of Birth/Sex: Treating RN: [birth year] (89 y.o. [name] [name] Primary Care Venetta Knee: [name] [name] Other Clinician: Referring Jakyle Petrucelli: Treating Laylonie Marzec/Extender: [name] [name] in Treatment: 4 Wound Status Wound Number: 5 Primary Open Surgical Wound Etiology: Wound Location: Right, Distal Gluteus Wound Open Wounding Event: Surgical Injury Status: Date Acquired: 09/08/2022 Comorbid Cataracts, Anemia, Asthma, Congestive Heart Failure, Weeks Of Treatment: 4 History: Hypertension, Type II Diabetes, Osteoarthritis Clustered Wound: No Photos Wound Measurements Length: (cm) Width: (cm) Depth: (cm) Area: (cm) Volume: (cm) 0 % Reduction in Area: 100% 0 % Reduction in Volume:  100% 0 Epithelialization: Large (67-100%) 0 Tunneling: No 0 Undermining: No Wound Description Classification: Full Thickness Without Exposed Suppor Exudate Amount: None Present t Structures Foul Odor After Cleansing: No Slough/Fibrino No Wound Bed Granulation Amount: None Present (0%) Exposed Structure Necrotic Amount: None Present (0%) Fat Layer (Subcutaneous Tissue) Exposed: No Electronic Signature(s) Signed: 12/12/2022 4:19:17 PM By: [name] [name] Entered By: [name] [name] on 12/12/2022 12:19:00 Mikael Spray ([id number]) [id number] Page 11 of 12 -------------------------------------------------------------------------------- Wound Assessment Details Patient Name: Date of Service: [name] [name], [name] [name] 12/12/2022 3:00 PM Medical Record Number: [id number] Patient Account Number: [id number] Date of Birth/Sex: Treating RN: 03-12-1932 (89 y.o. [name] [name] Primary Care Rayleen Wyrick: [name] [name] Other Clinician: Referring Joselyn Edling: Treating Dorothee Napierkowski/Extender: [name] [name] in Treatment: 4 Wound Status Wound Number: 6 Primary Trauma, Other Etiology: Wound Location: Right Lower Leg Wound Open Wounding Event: Skin Tear/Laceration Status: Date Acquired: 11/15/2022 Comorbid Cataracts, Anemia, Asthma, Congestive Heart Failure, Weeks Of Treatment: 2 History: Hypertension, Type II Diabetes, Osteoarthritis Clustered Wound: No Photos Wound Measurements Length: (cm) 1.5 Width: (cm) 0.5 Depth: (cm) 0.1 Area: (cm) 0.589 Volume: (cm) 0.059 % Reduction in Area: 79.6% % Reduction in Volume: 79.6% Epithelialization: Medium (34-66%) Tunneling: No Undermining: No Wound Description Classification: Full Thickness Without Exposed Support Structures Exudate Amount: Medium Exudate Type: Serosanguineous Exudate Color: red, brown Foul Odor After Cleansing: No Slough/Fibrino Yes Wound Bed Granulation Amount: Large (67-100%) Exposed  Structure Granulation Quality: Red Fat Layer (Subcutaneous Tissue) Exposed: No Necrotic Amount: Small (1-33%) Necrotic Quality: Adherent Slough Treatment Notes Wound #6 (Lower Leg) Wound Laterality: Right Cleanser Wound Cleanser Discharge Instruction: Wash your hands with soap and water. Remove old dressing, discard into plastic bag and place into trash. Cleanse the wound with Wound Cleanser prior to applying a clean dressing using gauze sponges, not tissues or cotton balls. Do not scrub or use excessive force. Pat dry using gauze sponges, not tissue or cotton balls. Peri-Wound Care [name] [name], [name] [name] ([id number]) [id number] Page 12 of 12 Topical Primary Dressing Xeroform 4x4-HBD (in/in) Discharge Instruction: Apply Xeroform 4x4-HBD (in/in) as directed Secondary Dressing (BORDER) Zetuvit Plus SILICONE BORDER Dressing 4x4 (in/in) Discharge Instruction: Please do not put silicone bordered dressings under wraps. Use non-bordered dressing only. Secured With Compression Wrap Compression Stockings Add-Ons Electronic Signature(s) Signed: 12/12/2022 4:19:17 PM By: [name] [name] Entered By: [name] [name] on 12/12/2022 12:19:32 -------------------------------------------------------------------------------- Vitals Details Patient Name: Date of Service: [name] Russell [name] 12/12/2022 3:00 PM Medical Record Number: [id number] Patient Account Number: [id number] Date of Birth/Sex: Treating RN: 07-31-1932 (89 y.o.  Open Open Wound Status: No No No Wound Recurrence: 2.9x4x2.4 0x0x0 1.5x0.5x0.1 Measurements L x W x D (cm) KARLIAH, KOWALCHUK ([id number]) [phone].pdf Page 5 of 12 9.111 0 0.589 A (cm) : rea 21.865 0 0.059 Volume (cm) : 68.30% 100.00% 79.60% % Reduction in A rea: 66.90% 100.00% 79.60% % Reduction in Volume: 4 Position 1 (o'clock): 2.3 Maximum Distance 1 (cm): Yes No No Tunneling: Full Thickness Without Exposed Full Thickness Without Exposed Full Thickness Without Exposed Classification: Support Structures Support Structures Support Structures Medium None Present Medium Exudate A mount: Serosanguineous N/A Serosanguineous Exudate Type: red, brown N/A red, brown Exudate Color: Large (67-100%) None Present (0%) Large (67-100%) Granulation A mount: Red N/A Red Granulation Quality: Small (1-33%) None Present (0%) Small (1-33%) Necrotic A mount: Fat Layer (Subcutaneous Tissue): Yes Fat Layer (Subcutaneous Tissue): No Fat Layer (Subcutaneous Tissue): No Exposed Structures: None Large (67-100%) Medium (34-66%) Epithelialization: Treatment  Notes Electronic Signature(s) Signed: 12/12/2022 4:02:49 PM By: [name] [name] Entered By: [name] [name] on 12/12/2022 13:02:49 -------------------------------------------------------------------------------- Multi-Disciplinary Care Plan Details Patient Name: Date of Service: [name] Russell [name] 12/12/2022 3:00 PM Medical Record Number: [id number] Patient Account Number: [id number] Date of Birth/Sex: Treating RN: 1932/08/23 (89 y.o. [name] [name] Primary Care Bela Bonaparte: [name] [name] Other Clinician: Referring Arland Usery: Treating Kenitra Leventhal/Extender: [name] [name] in Treatment: 4 Active Inactive Necrotic Tissue Nursing Diagnoses: Impaired tissue integrity related to necrotic/devitalized tissue Goals: Necrotic/devitalized tissue will be minimized in the wound bed Date Initiated: 11/10/2022 Target Resolution Date: 01/05/2023 Goal Status: Active Patient/caregiver will verbalize understanding of reason and process for debridement of necrotic tissue Date Initiated: 11/10/2022 Date Inactivated: 11/10/2022 Target Resolution Date: 11/10/2022 Goal Status: Met Interventions: Assess patient pain level pre-, during and post procedure and prior to discharge Provide education on necrotic tissue and debridement process Treatment Activities: Excisional debridement : 11/10/2022 Notes: Pressure Nursing Diagnoses: Knowledge deficit related to causes and risk factors for pressure ulcer development [name] [name], [name] [name] ([id number]) [phone].pdf Page 6 of 12 Potential for impaired tissue integrity related to pressure, friction, moisture, and shear Goals: Patient will remain free from development of additional pressure ulcers Date Initiated: 11/10/2022 Target Resolution Date: 01/05/2023 Goal Status: Active Patient will remain free of pressure ulcers Date Initiated: 11/10/2022 Target Resolution Date: 01/05/2023 Goal Status: Active Patient/caregiver will  verbalize risk factors for pressure ulcer development Date Initiated: 11/10/2022 Date Inactivated: 11/10/2022 Target Resolution Date: 11/10/2022 Goal Status: Met Patient/caregiver will verbalize understanding of pressure ulcer management Date Initiated: 11/10/2022 Date Inactivated: 11/10/2022 Target Resolution Date: 11/10/2022 Goal Status: Met Interventions: Assess: immobility, friction, shearing, incontinence upon admission and as needed Assess offloading mechanisms upon admission and as needed Assess potential for pressure ulcer upon admission and as needed Provide education on pressure ulcers Notes: Wound/Skin Impairment Nursing Diagnoses: Impaired tissue integrity Knowledge deficit related to ulceration/compromised skin integrity Goals: Ulcer/skin breakdown will have a volume reduction of 30% by week 4 Date Initiated: 11/10/2022 Date Inactivated: 12/12/2022 Target Resolution Date: 12/08/2022 Goal Status: Met Ulcer/skin breakdown will have a volume reduction of 50% by week 8 Date Initiated: 11/10/2022 Target Resolution Date: 01/05/2023 Goal Status: Active Ulcer/skin breakdown will have a volume reduction of 80% by week 12 Date Initiated: 11/10/2022 Target Resolution Date: 02/02/2023 Goal Status: Active Ulcer/skin breakdown will heal within 14 weeks Date Initiated: 11/10/2022 Target Resolution Date: 02/16/2023 Goal Status: Active Interventions: Assess patient/caregiver ability to obtain necessary supplies Assess patient/caregiver ability to perform ulcer/skin care regimen upon admission and as needed Assess ulceration(s) every visit Provide education on ulcer  Imaging (photographs - any number of wounds) []  - 0 Wound Tracing (instead of photographs) []  - 0 Simple Wound Measurement - one wound X- 2 5 Complex Wound Measurement - multiple wounds INTERVENTIONS - Wound Dressings X - Small Wound Dressing one or multiple wounds 2 10 []  - 0 Medium Wound Dressing one or multiple wounds []  - 0 Large Wound Dressing one or multiple wounds []  - 0 Application of Medications - topical []  - 0 Application of Medications - injection INTERVENTIONS - Miscellaneous []  - 0 External ear exam []  - 0 Specimen Collection (cultures, biopsies, blood, body fluids, etc.) []  - 0 Specimen(s) / Culture(s) sent or taken to Lab for analysis [name] [name], [name] [name] ([id number]) [phone].pdf Page 3 of 12 []  - 0 Patient Transfer (multiple staff / Nurse, adult / Similar devices) []  - 0 Simple Staple / Suture removal (25 or less) []  - 0 Complex Staple / Suture removal (26 or more) []  - 0 Hypo / Hyperglycemic Management (close monitor of Blood Glucose) []  - 0 Ankle / Brachial Index (ABI) - do not check if billed separately X- 1 5 Vital Signs Has the patient been seen at the hospital within the last three years: Yes Total Score: 110 Level Of Care: New/Established - Level 3 Electronic Signature(s) Signed: 12/12/2022 4:19:17 PM By: [name] [name] Entered By: [name] [name] on 12/12/2022 13:03:55 -------------------------------------------------------------------------------- Encounter Discharge Information Details Patient Name: Date of Service: [name] Russell [name] 12/12/2022 3:00 PM Medical Record Number: [id number] Patient Account Number: [id number] Date of Birth/Sex: Treating RN: [birth year] (89 y.o. [name] [name] Primary Care Ollis Daudelin:  [name] [name] Other Clinician: Referring Ventura Hollenbeck: Treating Eligah Anello/Extender: [name] [name] in Treatment: 4 Encounter Discharge Information Items Discharge Condition: Stable Ambulatory Status: Wheelchair Discharge Destination: Home Transportation: Private Auto Accompanied By: daughter in law Schedule Follow-up Appointment: Yes Clinical Summary of Care: Electronic Signature(s) Signed: 12/12/2022 4:06:45 PM By: [name] [name] Entered By: [name] [name] on 12/12/2022 13:06:44 -------------------------------------------------------------------------------- Lower Extremity Assessment Details Patient Name: Date of Service: [name] [name], [name] [name] 12/12/2022 3:00 PM Medical Record Number: [id number] Patient Account Number: [id number] Date of Birth/Sex: Treating RN: [birth year] (89 y.o. [name] [name] Primary Care Braxley Balandran: [name] [name] Other Clinician: AINSLEY, DEAKINS ([id number]) [id number] Page 4 of 12 Referring Vong Garringer: Treating Sparrow Siracusa/Extender: [name] [name] in Treatment: 4 Edema Assessment Assessed: [Left: No] [Right: No] Edema: [Left: Ye] [Right: s] Vascular Assessment Extremity colors, hair growth, and conditions: Extremity Color: [Right:Normal] Hair Growth on Extremity: [Right:No Warm] Notes pt wearing own compression sock Electronic Signature(s) Signed: 12/12/2022 3:23:53 PM By: [name] [name] Entered By: [name] [name] on 12/12/2022 12:23:53 -------------------------------------------------------------------------------- Multi Wound Chart Details Patient Name: Date of Service: [name] Russell [name] 12/12/2022 3:00 PM Medical Record Number: [id number] Patient Account Number: [id number] Date of Birth/Sex: Treating RN: [birth year] (89 y.o. [name] [name] Primary Care Jimi Giza: [name] [name] Other Clinician: Referring Blue Winther: Treating Laren Orama/Extender: [name] [name] in  Treatment: 4 Vital Signs Height(in): 66 Pulse(bpm): 71 Weight(lbs): 191 Blood Pressure(mmHg): 125/55 Body Mass Index(BMI): 30.8 Temperature(F): 97.5 Respiratory Rate(breaths/min): 18 [1:Photos:] Right, Medial Gluteus Right, Distal Gluteus Right Lower Leg Wound Location: Gradually Appeared Surgical Injury Skin Tear/Laceration Wounding Event: Abscess Open Surgical Wound Trauma, Other Primary Etiology: Cataracts, Anemia, Asthma, Cataracts, Anemia, Asthma, Cataracts, Anemia, Asthma, Comorbid History: Congestive Heart Failure, Congestive Heart Failure, Congestive Heart Failure, Hypertension, Type II Diabetes, Hypertension, Type II Diabetes, Hypertension, Type II Diabetes, Osteoarthritis Osteoarthritis Osteoarthritis 09/08/2022 09/08/2022 11/15/2022 Date Acquired: 4 4 2  Weeks of Treatment: Open  and skin care Notes: Negative Pressure Wound Therapy Nursing Diagnoses: Potential for imbalanced nutrition Impaired tissue integrity Goals: Patient/caregiver agrees to and verbalizes understanding of need to use Date Initiated: 11/10/2022 Date Inactivated: 11/10/2022 Target Resolution Date: 11/10/2022 Goal Status:  Met Promote granulation tissue Date Initiated: 11/10/2022 Target Resolution Date: 01/05/2023 Goal Status: Active Nutritional supplements and/or vitamins as prescribed Date Initiated: 11/10/2022 Target Resolution Date: 01/05/2023 Goal Status: Active Interventions: Assess drainage (amount and color) Assess patient nutrition upon admission and as needed per policy Assess patient understanding of disease process and management Assess patient/caregiver ability to obtain necessary supplies Assess wound bed for efficacy of treatment Monitor and protect skin around the wound KANYIA, HEASLIP ([id number]) [phone].pdf Page 7 of 12 Provide education on nutrition Treatment Activities: Education provided on Nutrition : 11/10/2022 Notes: Electronic Signature(s) Signed: 12/12/2022 4:05:23 PM By: [name] [name] Entered By: [name] [name] on 12/12/2022 13:05:23 -------------------------------------------------------------------------------- Pain Assessment Details Patient Name: Date of Service: [name] [name], VEGH. 12/12/2022 3:00 PM Medical Record Number: [id number] Patient Account Number: [id number] Date of Birth/Sex: Treating RN: 1933-01-26 (89 y.o. [name] [name] Primary Care Quasean Frye: [name] [name] Other Clinician: Referring Savvy Peeters: Treating Geneviene Tesch/Extender: [name] [name] in Treatment: 4 Active Problems Location of Pain Severity and Description of Pain Patient Has Paino No Site Locations Rate the pain. Current Pain Level: 0 Pain Management and Medication Current Pain Management: Electronic Signature(s) Signed: 12/12/2022 3:23:05 PM By: [name] [name] Entered By: [name] [name] on 12/12/2022 12:23:05 Mikael Spray ([id number]) [id number] Page 8 of 12 -------------------------------------------------------------------------------- Patient/Caregiver Education Details Patient Name: Date of Service: [name] [name], [name] [name] 10/15/2024andnbsp3:00 PM Medical Record Number: [id number] Patient Account Number: [id number] Date of Birth/Gender: Treating RN: [birth year] (89 y.o. [name] [name] Primary Care Physician: [name] [name] Other Clinician: Referring Physician: Treating Physician/Extender: [name] [name] in Treatment: 4 Education Assessment Education Provided To: Patient Education Topics Provided Wound/Skin Impairment: Handouts: Caring for Your Ulcer Methods: Explain/Verbal Responses: State content correctly Electronic Signature(s) Signed: 12/12/2022 4:19:17 PM By: [name] [name] Entered By: [name] [name] on 12/12/2022 13:05:59 -------------------------------------------------------------------------------- Wound Assessment Details Patient Name: Date of Service: [name] [name], [name] [name] 12/12/2022 3:00 PM Medical Record Number: [id number] Patient Account Number: [id number] Date of Birth/Sex: Treating RN: [birth year] (89 y.o. [name] [name] Primary Care Jaycey Gens: [name] [name] Other Clinician: Referring Anaisha Mago: Treating Margaret Staggs/Extender: [name] [name] in Treatment: 4 Wound Status Wound Number: 1 Primary Abscess Etiology: Wound Location: Right, Medial Gluteus Wound Open Wounding Event: Gradually Appeared Status: Date Acquired: 09/08/2022 Comorbid Cataracts, Anemia, Asthma, Congestive Heart Failure, Weeks Of Treatment: 4 History: Hypertension, Type II Diabetes, Osteoarthritis Clustered Wound: No Photos [name] [name], [name] [name] ([id number]) [id number] Page 9 of 12 Wound Measurements Length: (cm) 2.9 Width: (cm) 4 Depth: (cm) 2.4 Area: (cm) 9.111 Volume: (cm) 21.865 % Reduction in Area: 68.3% % Reduction in Volume: 66.9% Epithelialization: None Tunneling: Yes Position (o'clock): 4 Maximum Distance: (cm) 2.3 Undermining: No Wound Description Classification: Full Thickness Without Exposed Suppor Exudate Amount:  Medium Exudate Type: Serosanguineous Exudate Color: red, brown t Structures Foul Odor After Cleansing: No Slough/Fibrino Yes Wound Bed Granulation Amount: Large (67-100%) Exposed Structure Granulation Quality: Red Fat Layer (Subcutaneous Tissue) Exposed: Yes Necrotic Amount: Small (1-33%) Treatment Notes Wound #1 (Gluteus) Wound Laterality: Right, Medial Cleanser Peri-Wound Care Topical Primary Dressing Gauze Discharge Instruction: As directed: moistened with vashe or dakins, dressing daily until wound vac re applied Secondary Dressing ABD Pad 5x9 (in/in) Discharge Instruction: Cover with ABD pad Secured With Medipore T -  Imaging (photographs - any number of wounds) []  - 0 Wound Tracing (instead of photographs) []  - 0 Simple Wound Measurement - one wound X- 2 5 Complex Wound Measurement - multiple wounds INTERVENTIONS - Wound Dressings X - Small Wound Dressing one or multiple wounds 2 10 []  - 0 Medium Wound Dressing one or multiple wounds []  - 0 Large Wound Dressing one or multiple wounds []  - 0 Application of Medications - topical []  - 0 Application of Medications - injection INTERVENTIONS - Miscellaneous []  - 0 External ear exam []  - 0 Specimen Collection (cultures, biopsies, blood, body fluids, etc.) []  - 0 Specimen(s) / Culture(s) sent or taken to Lab for analysis [name] [name], [name] [name] ([id number]) [phone].pdf Page 3 of 12 []  - 0 Patient Transfer (multiple staff / Nurse, adult / Similar devices) []  - 0 Simple Staple / Suture removal (25 or less) []  - 0 Complex Staple / Suture removal (26 or more) []  - 0 Hypo / Hyperglycemic Management (close monitor of Blood Glucose) []  - 0 Ankle / Brachial Index (ABI) - do not check if billed separately X- 1 5 Vital Signs Has the patient been seen at the hospital within the last three years: Yes Total Score: 110 Level Of Care: New/Established - Level 3 Electronic Signature(s) Signed: 12/12/2022 4:19:17 PM By: [name] [name] Entered By: [name] [name] on 12/12/2022 13:03:55 -------------------------------------------------------------------------------- Encounter Discharge Information Details Patient Name: Date of Service: [name] Russell [name] 12/12/2022 3:00 PM Medical Record Number: [id number] Patient Account Number: [id number] Date of Birth/Sex: Treating RN: [birth year] (89 y.o. [name] [name] Primary Care Ollis Daudelin:  [name] [name] Other Clinician: Referring Ventura Hollenbeck: Treating Eligah Anello/Extender: [name] [name] in Treatment: 4 Encounter Discharge Information Items Discharge Condition: Stable Ambulatory Status: Wheelchair Discharge Destination: Home Transportation: Private Auto Accompanied By: daughter in law Schedule Follow-up Appointment: Yes Clinical Summary of Care: Electronic Signature(s) Signed: 12/12/2022 4:06:45 PM By: [name] [name] Entered By: [name] [name] on 12/12/2022 13:06:44 -------------------------------------------------------------------------------- Lower Extremity Assessment Details Patient Name: Date of Service: [name] [name], [name] [name] 12/12/2022 3:00 PM Medical Record Number: [id number] Patient Account Number: [id number] Date of Birth/Sex: Treating RN: [birth year] (89 y.o. [name] [name] Primary Care Braxley Balandran: [name] [name] Other Clinician: AINSLEY, DEAKINS ([id number]) [id number] Page 4 of 12 Referring Vong Garringer: Treating Sparrow [place]/Extender: [name] [name] in Treatment: 4 Edema Assessment Assessed: [Left: No] [Right: No] Edema: [Left: Ye] [Right: s] Vascular Assessment Extremity colors, hair growth, and conditions: Extremity Color: [Right:Normal] Hair Growth on Extremity: [Right:No Warm] Notes pt wearing own compression sock Electronic Signature(s) Signed: 12/12/2022 3:23:53 PM By: [name] [name] Entered By: [name] [name] on 12/12/2022 12:23:53 -------------------------------------------------------------------------------- Multi Wound Chart Details Patient Name: Date of Service: [name] Russell [name] 12/12/2022 3:00 PM Medical Record Number: [id number] Patient Account Number: [id number] Date of Birth/Sex: Treating RN: [birth year] (89 y.o. [name] [name] Primary Care Jimi [place]: [name] [name] Other Clinician: Referring Blue Winther: Treating Laren Orama/Extender: [name] [name] in  Treatment: 4 Vital Signs Height(in): 66 Pulse(bpm): 71 Weight(lbs): 191 Blood Pressure(mmHg): 125/55 Body Mass Index(BMI): 30.8 Temperature(F): 97.5 Respiratory Rate(breaths/min): 18 [1:Photos:] Right, Medial Gluteus Right, Distal Gluteus Right Lower Leg Wound Location: Gradually Appeared Surgical Injury Skin Tear/Laceration Wounding Event: Abscess Open Surgical Wound Trauma, Other Primary Etiology: Cataracts, Anemia, Asthma, Cataracts, Anemia, Asthma, Cataracts, Anemia, Asthma, Comorbid History: Congestive Heart Failure, Congestive Heart Failure, Congestive Heart Failure, Hypertension, Type II Diabetes, Hypertension, Type II Diabetes, Hypertension, Type II Diabetes, Osteoarthritis Osteoarthritis Osteoarthritis 09/08/2022 09/08/2022 11/15/2022 Date Acquired: 4 4 2  Weeks of Treatment: Open

## 2022-12-26 ENCOUNTER — Encounter: Payer: Medicare Other | Admitting: Physician Assistant

## 2022-12-26 NOTE — Progress Notes (Signed)
Shower/ Hygiene: No tub bath. Anesthetic (Use 'Patient Medications' Section for Anesthetic Order Entry): Lidocaine applied to wound bed Off-Loading: Turn and reposition every 2 hours Other: - stay off bottom as much as possible Negative Pressure Wound Therapy: Wound VAC settings at continuous pressure. Use foam to wound cavity. Please order WHITE foam to fill any tunnel/s and/or undermining when necessary. Change VAC dressing 3 X WEEK. Change canister as indicated when full. - Please continue to bridge wound vac to side of hip. Wound vac no longer needs to be bridged to drain sites. Home Health Nurse may d/c VAC for s/s of increased infection, significant wound regression, or uncontrolled drainage. Not[facility] at [phone]. Other: - PLEASE READ PRIOR TO VAC PLACEMENT please place PRISMA (may pack in dry) in tunnel at 3 O'clock tunneling and 9 O'clock tunneling and : also straight in for area of depth and then also cutting a tail of black foam to also pack into tunnel area at 3 and 9 O'clock and for depth area in middle of wound, wound vac only needs to be placed on main wound. WOUND #1: - Gluteus Wound Laterality: Right, Medial Prim Dressing: Gauze 1 x Per Day/15 Days ary Discharge Instructions: As directed: moistened with vashe or dakins, dressing daily until wound vac re applied Secondary Dressing: ABD Pad 5x9 (in/in) 1 x Per Day/15 Days Discharge Instructions: Cover with ABD pad Secured With: Medipore T - 76M Medipore H Soft Cloth Surgical T ape ape, 2x2 (in/yd) 1 x Per Day/15 Days WOUND #6: - Lower Leg Wound Laterality: Right Cleanser: Wound Cleanser 3 x Per Week/30 Days Discharge Instructions: Wash your hands with soap and water. Remove old dressing, discard into plastic bag and place into trash. Cleanse the wound with Wound Cleanser prior to applying a clean dressing using gauze  sponges, not tissues or cotton balls. Do not scrub or use excessive force. Pat dry using gauze sponges, not tissue or cotton balls. Prim Dressing: Xeroform 4x4-HBD (in/in) 3 x Per Week/30 Days ary Discharge Instructions: Apply Xeroform 4x4-HBD (in/in) as directed Secondary Dressing: (BORDER) Zetuvit Plus SILICONE BORDER Dressing 4x4 (in/in) 3 x Per Week/30 Days Discharge Instructions: Please do not put silicone bordered dressings under wraps. Use non-bordered dressing only. 1. I would recommend that the patient should continue with the wound VAC I am hopeful however in the next several weeks she will be able to discontinue that I know she is getting very tired of the wound VAC. 2. I am going to recommend as well that the patient should continue to monitor for any signs of infection or worsening. Overall and in general I think that we are making headway here towards closure which is good news. We will see patient back for reevaluation in 1 week here in the clinic. If anything worsens or changes patient will contact our office for additional recommendations. [name] [name], [name] [name] ([id number]) [id number] Page 7 of 7 Electronic Signature(s) Signed: 12/26/2022 5:29:50 PM By: Allen Derry PA-C Entered By: Allen Derry on 12/26/2022 17:29:50 -------------------------------------------------------------------------------- SuperBill Details Patient Name: Date of Service: [name] [name], [name] [name] 12/26/2022 Medical Record Number: [id number] Patient Account Number: [id number] Date of Birth/Sex: Treating RN: April 14, 1932 (89 y.o. 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 Anesthetic (Use 'Patient Medications' Section for Anesthetic Order Entry): Lidocaine applied to wound bed Off-Loading: Turn and reposition every 2 hours Other: - stay off bottom as much as possible Negative Pressure Wound Therapy: Wound VAC settings at continuous pressure. Use foam to wound cavity. Please order WHITE foam to fill any tunnel/s and/or undermining when necessary. Change VAC dressing 3 X WEEK. Change canister as indicated when full. - Please continue to bridge wound vac to side of hip. Wound vac no longer needs to be bridged to drain sites. Home Health Nurse may d/c VAC for s/s of increased infection, significant wound regression, or uncontrolled drainage. Not[facility] at [phone]. Other: - PLEASE READ PRIOR TO VAC PLACEMENT please place PRISMA (may pack in dry) in tunnel at 3 O'clock tunneling and 9 O'clock tunneling and : also straight in for area of depth and then also cutting a tail of black foam to also pack into tunnel area at 3 and 9 O'clock and for depth area in middle of wound, wound vac only needs to be placed on main wound. WOUND #1: - Gluteus Wound Laterality: Right, Medial Prim Dressing: Gauze 1 x Per Day/15 Days ary Discharge Instructions: As directed: moistened with vashe or dakins, dressing daily until wound vac re applied Secondary Dressing: ABD Pad 5x9 (in/in) 1 x Per Day/15 Days Discharge Instructions: Cover with ABD pad Secured With: Medipore T - 76M Medipore H Soft Cloth Surgical T ape ape, 2x2 (in/yd) 1 x Per Day/15 Days WOUND #6: - Lower Leg Wound Laterality: Right Cleanser: Wound Cleanser 3 x Per Week/30 Days Discharge Instructions: Wash your hands with soap and water. Remove old dressing, discard into plastic bag and place into trash. Cleanse the wound with Wound Cleanser prior to applying a clean dressing using gauze  sponges, not tissues or cotton balls. Do not scrub or use excessive force. Pat dry using gauze sponges, not tissue or cotton balls. Prim Dressing: Xeroform 4x4-HBD (in/in) 3 x Per Week/30 Days ary Discharge Instructions: Apply Xeroform 4x4-HBD (in/in) as directed Secondary Dressing: (BORDER) Zetuvit Plus SILICONE BORDER Dressing 4x4 (in/in) 3 x Per Week/30 Days Discharge Instructions: Please do not put silicone bordered dressings under wraps. Use non-bordered dressing only. 1. I would recommend that the patient should continue with the wound VAC I am hopeful however in the next several weeks she will be able to discontinue that I know she is getting very tired of the wound VAC. 2. I am going to recommend as well that the patient should continue to monitor for any signs of infection or worsening. Overall and in general I think that we are making headway here towards closure which is good news. We will see patient back for reevaluation in 1 week here in the clinic. If anything worsens or changes patient will contact our office for additional recommendations. [name] [name], [name] [name] ([id number]) [id number] Page 7 of 7 Electronic Signature(s) Signed: 12/26/2022 5:29:50 PM By: Allen Derry PA-C Entered By: Allen Derry on 12/26/2022 17:29:50 -------------------------------------------------------------------------------- SuperBill Details Patient Name: Date of Service: [name] [name], [name] [name] 12/26/2022 Medical Record Number: [id number] Patient Account Number: [id number] Date of Birth/Sex: Treating RN: April 14, 1932 (89 y.o. 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 [name] Provider: [name] Other Clinician: Referring Provider: Treating Provider/Extender: Lavonia Drafts in Treatment: 6 Subjective Chief Complaint Information obtained from Patient Bilateral Gluteal Ulcers and right leg skin tear History of Present Illness (HPI) 11-10-2022 upon evaluation today patient presents for initial inspection here in our clinic concerning a wound which occurred unfortunately as a result of an abscess. This was the main right gluteal/sacral region. With that being said she noted this first around or just before July 12 when she was admitted to the hospital from the 12th through the 26. The wound was at that point drained and subsequently she thought that she was headed in the right direction. With that being said she ended up having to be admitted to the hospital August and this was due to a CHF exacerbation with a took 25 pounds of fluid off of her. Subsequently she was then rehab for the wound at [facility] burn and the physician there apparently did an awesome job based on what I am seeing the wound actually looks like is doing quite well at this point. They were doing a fairly  complex VAC with the small openings in multiple areas that were being backed as well at this point the good news is I do not think that he would need to be necessary based on what I am seeing. She does have home health coming out to do the wound VAC changes Monday, Wednesday, and Friday although get this scheduled going has been somewhat a concern due to the fact that the patient is been in and out of the hospital. Patient does have a history of having had a stroke in 2001 with some left-sided weakness. She also on September 08, 2022 had her A1c checked this was 9.8. She is a diabetic. Other than these 2 conditions she also has chronic kidney disease stage III, hypertension, and again obviously the wounds which were can be helping to manage at this point. 11-28-2022 upon evaluation today patient appears to be doing well currently in regard to her wound in the sacral area. Most of the surrounding wounds are actually closed and they look excellent. I am actually very pleased with where we stand. The only downside is she does have a skin tear which is still questionable whether it is going to resolved with a skin flap intact or if it is going skin flap did great and breakdown we will know more as it goes over the next couple of weeks. Nonetheless right now this actually does not look too bad definitely no signs of infection this is on the right lower leg. Otherwise the patient seems to be doing extremely well with regard to the wound VAC I think that this is doing an awesome job I think putting some collagen down to the tunnel would be beneficial I think the black foam is still better than the white foam to be honest. 12-12-2022 upon evaluation today patient appears to be doing pretty well currently in regard to her wound. She has been tolerating the wound VAC without complication and seems to be making good progress here. Fortunately I do not see any signs of infection locally or systemically which is  Shower/ Hygiene: No tub bath. Anesthetic (Use 'Patient Medications' Section for Anesthetic Order Entry): Lidocaine applied to wound bed Off-Loading: Turn and reposition every 2 hours Other: - stay off bottom as much as possible Negative Pressure Wound Therapy: Wound VAC settings at continuous pressure. Use foam to wound cavity. Please order WHITE foam to fill any tunnel/s and/or undermining when necessary. Change VAC dressing 3 X WEEK. Change canister as indicated when full. - Please continue to bridge wound vac to side of hip. Wound vac no longer needs to be bridged to drain sites. Home Health Nurse may d/c VAC for s/s of increased infection, significant wound regression, or uncontrolled drainage. Not[facility] at [phone]. Other: - PLEASE READ PRIOR TO VAC PLACEMENT please place PRISMA (may pack in dry) in tunnel at 3 O'clock tunneling and 9 O'clock tunneling and : also straight in for area of depth and then also cutting a tail of black foam to also pack into tunnel area at 3 and 9 O'clock and for depth area in middle of wound, wound vac only needs to be placed on main wound. WOUND #1: - Gluteus Wound Laterality: Right, Medial Prim Dressing: Gauze 1 x Per Day/15 Days ary Discharge Instructions: As directed: moistened with vashe or dakins, dressing daily until wound vac re applied Secondary Dressing: ABD Pad 5x9 (in/in) 1 x Per Day/15 Days Discharge Instructions: Cover with ABD pad Secured With: Medipore T - 76M Medipore H Soft Cloth Surgical T ape ape, 2x2 (in/yd) 1 x Per Day/15 Days WOUND #6: - Lower Leg Wound Laterality: Right Cleanser: Wound Cleanser 3 x Per Week/30 Days Discharge Instructions: Wash your hands with soap and water. Remove old dressing, discard into plastic bag and place into trash. Cleanse the wound with Wound Cleanser prior to applying a clean dressing using gauze  sponges, not tissues or cotton balls. Do not scrub or use excessive force. Pat dry using gauze sponges, not tissue or cotton balls. Prim Dressing: Xeroform 4x4-HBD (in/in) 3 x Per Week/30 Days ary Discharge Instructions: Apply Xeroform 4x4-HBD (in/in) as directed Secondary Dressing: (BORDER) Zetuvit Plus SILICONE BORDER Dressing 4x4 (in/in) 3 x Per Week/30 Days Discharge Instructions: Please do not put silicone bordered dressings under wraps. Use non-bordered dressing only. 1. I would recommend that the patient should continue with the wound VAC I am hopeful however in the next several weeks she will be able to discontinue that I know she is getting very tired of the wound VAC. 2. I am going to recommend as well that the patient should continue to monitor for any signs of infection or worsening. Overall and in general I think that we are making headway here towards closure which is good news. We will see patient back for reevaluation in 1 week here in the clinic. If anything worsens or changes patient will contact our office for additional recommendations. [name] [name], [name] [name] ([id number]) [id number] Page 7 of 7 Electronic Signature(s) Signed: 12/26/2022 5:29:50 PM By: Allen Derry PA-C Entered By: Allen Derry on 12/26/2022 17:29:50 -------------------------------------------------------------------------------- SuperBill Details Patient Name: Date of Service: [name] [name], [name] [name] 12/26/2022 Medical Record Number: [id number] Patient Account Number: [id number] Date of Birth/Sex: Treating RN: April 14, 1932 (89 y.o. 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 [name] Provider: [name] Other Clinician: Referring Provider: Treating Provider/Extender: Lavonia Drafts in Treatment: 6 Information Obtained from: Patient Chief Complaint Bilateral Gluteal Ulcers and right leg skin tear Electronic Signature(s) Signed: 12/26/2022 3:41:21 PM By: Allen Derry PA-C Entered By: Allen Derry on 12/26/2022 15:41:20 -------------------------------------------------------------------------------- HPI Details Patient Name: Date of Service: [name] Russell [name] 12/26/2022 3:00 PM Medical Record Number: [id number] Patient Account Number: [id number] Date of Birth/Sex: Treating RN: [birth year] (89 y.o. [name] Provider: [name] Other Clinician: Referring Provider: Treating Provider/Extender: Lavonia Drafts in Treatment: 6 History of Present Illness HPI Description: 11-10-2022 upon evaluation today patient presents for initial inspection here in our clinic concerning a wound which occurred unfortunately as a result of an abscess. This was the main right gluteal/sacral region. With that being said she noted this first around or just before July 12 when she was admitted to the hospital from the 12th through the 26. The wound was at that point drained and subsequently she thought that she was headed in the right direction. With that being said she ended up having to be admitted to the hospital August and this was due to a CHF exacerbation with a took 25 pounds of fluid off of her. Subsequently she was then rehab for the wound at [facility] burn and the  physician there apparently did an awesome job based on what I am seeing the wound actually looks like is doing quite well at this point. They were doing a fairly complex VAC with the small openings in multiple areas that were being backed as well at this point the good news is I do not think that he would need to be necessary based on what I am seeing. She does have home health coming out to do the wound VAC changes Monday, Wednesday, and Friday although get this scheduled going has been somewhat a concern due to the fact that the patient is been in and out of the hospital. Patient does have a history of having had a stroke in 2001 with some left-sided weakness. She also on September 08, 2022 had her A1c checked this was 9.8. She is a diabetic. Other than these 2 conditions she also has chronic kidney disease stage III, hypertension, and again obviously the wounds which were can be helping to manage at this point. 11-28-2022 upon evaluation today patient appears to be doing well currently in regard to her wound in the sacral area. Most of the surrounding wounds are actually closed and they look excellent. I am actually very pleased with where we stand. The only downside is she does have a skin tear which is still questionable whether it is going to resolved with a skin flap intact or if it is going skin flap did great and breakdown we will know more as it goes over the next [name] [name], [name] [name] ([id number]) [phone].pdf Page 2 of 7 couple of weeks. Nonetheless right now this actually does not look too bad definitely no signs of infection this is on the right lower leg. Otherwise the patient seems to be doing extremely well with regard to the wound VAC I think that this is doing an awesome job  regression in wound condition at [phone]. Bathing/ Shower/ Hygiene No tub bath. Anesthetic (Use 'Patient Medications' Section for Anesthetic Order Entry) Lidocaine applied to wound bed Off-Loading Turn and reposition every 2 hours Other: - stay off bottom as much as possible Negative Pressure Wound Therapy Wound VAC settings at continuous pressure. Use foam to wound cavity. Please order WHITE foam to fill any tunnel/s and/or undermining when necessary. Change VAC dressing 3 X WEEK. Change canister as indicated when full. - Please continue to bridge wound vac to side of hip. Wound vac no longer needs to be bridged to drain sites. Home Health Nurse may d/c VAC for s/s of increased infection, significant wound regression, or uncontrolled drainage. Notify [facility] at [phone]. Other: - PLEASE READ PRIOR TO VAC PLACEMENT please place PRISMA (may pack in dry) in tunnel at 3 O'clock tunneling and 9 O'clock : tunneling and also straight in for area of depth and then also cutting a tail of black foam to also pack into tunnel area at 3 and 9 O'clock and for depth area in middle of wound, wound vac only needs to be placed on main wound. Wound Treatment Wound #1 - Gluteus Wound Laterality: Right, Medial Prim Dressing: Gauze 1 x Per Day/15 Days ary Discharge Instructions: As directed: moistened with vashe or dakins, dressing daily until wound vac re applied Secondary Dressing: ABD Pad 5x9 (in/in) 1 x Per Day/15 Days Discharge Instructions: Cover with ABD pad Secured With: Medipore T - 59M Medipore H Soft Cloth Surgical T ape ape, 2x2 (in/yd) 1 x Per Day/15 Days Wound #6 - Lower Leg Wound Laterality: Right Cleanser: Wound Cleanser 3 x Per Week/30 Days Discharge Instructions: Wash your hands with  soap and water. Remove old dressing, discard into plastic bag and place into trash. Cleanse the wound with Wound Cleanser prior to applying a clean dressing using gauze sponges, not tissues or cotton balls. Do not scrub or use excessive force. Pat dry using gauze sponges, not tissue or cotton balls. Prim Dressing: Xeroform 4x4-HBD (in/in) 3 x Per Week/30 Days ary Discharge Instructions: Apply Xeroform 4x4-HBD (in/in) as directed Secondary Dressing: (BORDER) Zetuvit Plus SILICONE BORDER Dressing 4x4 (in/in) 3 x Per Week/30 Days Discharge Instructions: Please do not put silicone bordered dressings under wraps. Use non-bordered dressing only. Electronic Signature(s) Signed: 12/26/2022 4:25:48 PM By: Barnet Pall, Derenda Fennel ([id number]) [id number] Page 4 of 7 Signed: 12/26/2022 6:10:41 PM By: Allen Derry PA-C Entered By: Angelina Pih on 12/26/2022 16:22:16 -------------------------------------------------------------------------------- Problem List Details Patient Name: Date of Service: [name] [name], [name] [name] 12/26/2022 3:00 PM Medical Record Number: [id number] Patient Account Number: [id number] Date of Birth/Sex: Treating RN: [birth year] (89 y.o. 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 Anesthetic (Use 'Patient Medications' Section for Anesthetic Order Entry): Lidocaine applied to wound bed Off-Loading: Turn and reposition every 2 hours Other: - stay off bottom as much as possible Negative Pressure Wound Therapy: Wound VAC settings at continuous pressure. Use foam to wound cavity. Please order WHITE foam to fill any tunnel/s and/or undermining when necessary. Change VAC dressing 3 X WEEK. Change canister as indicated when full. - Please continue to bridge wound vac to side of hip. Wound vac no longer needs to be bridged to drain sites. Home Health Nurse may d/c VAC for s/s of increased infection, significant wound regression, or uncontrolled drainage. Notify [facility] at [phone]. Other: - PLEASE READ PRIOR TO VAC PLACEMENT please place PRISMA (may pack in dry) in tunnel at 3 O'clock tunneling and 9 O'clock tunneling and : also straight in for area of depth and then also cutting a tail of black foam to also pack into tunnel area at 3 and 9 O'clock and for depth area in middle of wound, wound vac only needs to be placed on main wound. WOUND #1: - Gluteus Wound Laterality: Right, Medial Prim Dressing: Gauze 1 x Per Day/15 Days ary Discharge Instructions: As directed: moistened with vashe or dakins, dressing daily until wound vac re applied Secondary Dressing: ABD Pad 5x9 (in/in) 1 x Per Day/15 Days Discharge Instructions: Cover with ABD pad Secured With: Medipore T - 76M Medipore H Soft Cloth Surgical T ape ape, 2x2 (in/yd) 1 x Per Day/15 Days WOUND #6: - Lower Leg Wound Laterality: Right Cleanser: Wound Cleanser 3 x Per Week/30 Days Discharge Instructions: Wash your hands with soap and water. Remove old dressing, discard into plastic bag and place into trash. Cleanse the wound with Wound Cleanser prior to applying a clean dressing using gauze  sponges, not tissues or cotton balls. Do not scrub or use excessive force. Pat dry using gauze sponges, not tissue or cotton balls. Prim Dressing: Xeroform 4x4-HBD (in/in) 3 x Per Week/30 Days ary Discharge Instructions: Apply Xeroform 4x4-HBD (in/in) as directed Secondary Dressing: (BORDER) Zetuvit Plus SILICONE BORDER Dressing 4x4 (in/in) 3 x Per Week/30 Days Discharge Instructions: Please do not put silicone bordered dressings under wraps. Use non-bordered dressing only. 1. I would recommend that the patient should continue with the wound VAC I am hopeful however in the next several weeks she will be able to discontinue that I know she is getting very tired of the wound VAC. 2. I am going to recommend as well that the patient should continue to monitor for any signs of infection or worsening. Overall and in general I think that we are making headway here towards closure which is good news. We will see patient back for reevaluation in 1 week here in the clinic. If anything worsens or changes patient will contact our office for additional recommendations. [name] [name], [name] [name] ([id number]) [id number] Page 7 of 7 Electronic Signature(s) Signed: 12/26/2022 5:29:50 PM By: Allen Derry PA-C Entered By: Allen Derry on 12/26/2022 17:29:50 -------------------------------------------------------------------------------- SuperBill Details Patient Name: Date of Service: [name] [name], [name] [name] 12/26/2022 Medical Record Number: [id number] Patient Account Number: [id number] Date of Birth/Sex: Treating RN: April 14, 1932 (89 y.o. 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

## 2022-12-27 ENCOUNTER — Encounter: Payer: Self-pay | Admitting: Cardiology

## 2022-12-27 ENCOUNTER — Ambulatory Visit: Payer: Medicare Other | Attending: Cardiology | Admitting: Cardiology

## 2022-12-27 VITALS — BP 120/42 | HR 69 | Ht 66.0 in | Wt 186.2 lb

## 2022-12-27 DIAGNOSIS — I5032 Chronic diastolic (congestive) heart failure: Secondary | ICD-10-CM | POA: Diagnosis not present

## 2022-12-27 DIAGNOSIS — I10 Essential (primary) hypertension: Secondary | ICD-10-CM

## 2022-12-27 DIAGNOSIS — Z79899 Other long term (current) drug therapy: Secondary | ICD-10-CM | POA: Diagnosis not present

## 2022-12-27 DIAGNOSIS — N1832 Chronic kidney disease, stage 3b: Secondary | ICD-10-CM

## 2022-12-27 NOTE — Progress Notes (Signed)
SNIYAH, KRZYWICKI ([id number]) [id number] Page 1 of 11 Visit Report for 12/26/2022 Arrival Information Details Patient Name: Date of Service: [name] [name], [name] [name] 12/26/2022 3:00 PM Medical Record Number: [id number] Patient Account Number: [id number] Date of Birth/Sex: Treating RN: Dec 07, 1932 (89 y.o. [name] [name] Primary Care [name] [name]: [name] [name] Other Clinician: Referring [name] [name]: Treating [name] [name]/Extender: [name] [name] in Treatment: 6 Visit Information History Since Last Visit Added or deleted any medications: No Patient Arrived: Wheel Chair Any new allergies or adverse reactions: No Arrival Time: 15:19 Had a fall or experienced change in No Accompanied By: family activities of daily living that may affect Transfer Assistance: Manual risk of falls: Patient Identification Verified: Yes Hospitalized since last visit: No Secondary Verification Process Completed: Yes Has Dressing in Place as Prescribed: Yes Patient Has Alerts: Yes Pain Present Now: No Patient Alerts: Patient on Blood Thinner type 2 diabetic Stroke 2021, R sided weak Electronic Signature(s) Signed: 12/26/2022 3:19:45 PM By: [name] [name] Entered By: [name] [name] on 12/26/2022 15:19:44 -------------------------------------------------------------------------------- Clinic Level of Care Assessment Details Patient Name: Date of Service: [name] [name], [name] [name] 12/26/2022 3:00 PM Medical Record Number: [id number] Patient Account Number: [id number] Date of Birth/Sex: Treating RN: August 19, 1932 (89 y.o. [name] [name] Primary Care Christobal Morado: [name] [name] Other Clinician: Referring [name] [name]: Treating [name] [name] in Treatment: 6 Clinic Level of Care Assessment Items TOOL 4 Quantity Score []  - 0 Use when only an EandM is performed on FOLLOW-UP visit ASSESSMENTS - Nursing Assessment / Reassessment X- 1 10 Reassessment  of Co-morbidities (includes updates in patient status) X- 1 5 Reassessment of Adherence to Treatment Plan ASSESSMENTS - Wound and Skin A ssessment / Reassessment []  - 0 Simple Wound Assessment / Reassessment - one wound [name] [name], [name] [name] ([id number]) [id number] Page 2 of 11 X- 2 5 Complex Wound Assessment / Reassessment - multiple wounds []  - 0 Dermatologic / Skin Assessment (not related to wound area) ASSESSMENTS - Focused Assessment []  - 0 Circumferential Edema Measurements - multi extremities []  - 0 Nutritional Assessment / Counseling / Intervention []  - 0 Lower Extremity Assessment (monofilament, tuning fork, pulses) []  - 0 Peripheral Arterial Disease Assessment (using hand held doppler) ASSESSMENTS - Ostomy and/or Continence Assessment and Care []  - 0 Incontinence Assessment and Management []  - 0 Ostomy Care Assessment and Management (repouching, etc.) PROCESS - Coordination of Care X - Simple Patient / Family Education for ongoing care 1 15 []  - 0 Complex (extensive) Patient / Family Education for ongoing care X- 1 10 Staff obtains Chiropractor, Records, T Results / Process Orders est []  - 0 Staff telephones HHA, Nursing Homes / Clarify orders / etc []  - 0 Routine Transfer to another Facility (non-emergent condition) []  - 0 Routine Hospital Admission (non-emergent condition) []  - 0 New Admissions / Manufacturing engineer / Ordering NPWT Apligraf, etc. , []  - 0 Emergency Hospital Admission (emergent condition) X- 1 10 Simple Discharge Coordination []  - 0 Complex (extensive) Discharge Coordination PROCESS - Special Needs []  - 0 Pediatric / Minor Patient Management []  - 0 Isolation Patient Management []  - 0 Hearing / Language / Visual special needs []  - 0 Assessment of Community assistance (transportation, D/C planning, etc.) []  - 0 Additional assistance / Altered mentation []  - 0 Support Surface(s) Assessment (bed, cushion, seat,  etc.) INTERVENTIONS - Wound Cleansing / Measurement []  - 0 Simple Wound Cleansing - one wound X- 2 5 Complex Wound Cleansing - multiple wounds X- 1 5 Wound Imaging (photographs - any  N/A Date Acquired: 6 4 N/A Weeks of Treatment: Open Open N/A Wound Status: No No N/A Wound Recurrence: 2.5x3.5x2 0.8x0.7x0.1 N/A Measurements L x W x D (cm) 6.872 0.44 N/A A (cm) : rea 13.744 0.044 N/A Volume (cm) : 76.10% 84.80% N/A % Reduction in A rea: 79.20% 84.80% N/A % Reduction in Volume: 3 Position 1 (o'clock): 1.9 Maximum Distance 1 (cm): 9 Position 2 (o'clock): 2.3 Maximum Distance 2 (cm): Yes No N/A Tunneling: Full Thickness Without Exposed Full Thickness Without Exposed N/A Classification: Support Structures Support Structures Medium Medium N/A Exudate A mount: Serosanguineous Serosanguineous N/A Exudate Type: red, brown red, brown N/A Exudate Color: Large (67-100%) Large (67-100%) N/A Granulation A mount: Red Red N/A Granulation Quality: None Present (0%) Small (1-33%) N/A Necrotic A mount: Fat Layer (Subcutaneous Tissue): Yes Fat Layer (Subcutaneous Tissue): No N/A Exposed Structures: None Medium (34-66%) N/A Epithelialization: Treatment Notes Electronic Signature(s) Signed: 12/26/2022 4:21:57 PM By: [name] [name] Previous Signature: 12/26/2022  3:21:48 PM Version By: [name] [name] Entered By: [name] [name] on 12/26/2022 16:21:56 -------------------------------------------------------------------------------- Multi-Disciplinary Care Plan Details Patient Name: Date of Service: [name] 12/26/2022 3:00 PM Medical Record Number: [id number] Patient Account Number: [id number] Date of Birth/Sex: Treating RN: [birth year] (89 y.o. [name] [name] Primary Care Jeziel Hoffmann: [name] [name] Other Clinician: Referring Audi Conover: Treating Fahd Galea/Extender: [name] [name] in Treatment: 6 Active Inactive Necrotic Tissue Nursing Diagnoses: Impaired tissue integrity related to necrotic/devitalized tissue Goals: Necrotic/devitalized tissue will be minimized in the wound bed Date Initiated: 11/10/2022 Target Resolution Date: 01/05/2023 Goal Status: Active Patient/caregiver will verbalize understanding of reason and process for debridement of necrotic tissue Date Initiated: 11/10/2022 Date Inactivated: 11/10/2022 Target Resolution Date: 11/10/2022 Goal Status: Met Interventions: KATALEYA, WATCHMAN ([id number]) [id number] Page 6 of 11 Assess patient pain level pre-, during and post procedure and prior to discharge Provide education on necrotic tissue and debridement process Treatment Activities: Excisional debridement : 11/10/2022 Notes: Pressure Nursing Diagnoses: Knowledge deficit related to causes and risk factors for pressure ulcer development Potential for impaired tissue integrity related to pressure, friction, moisture, and shear Goals: Patient will remain free from development of additional pressure ulcers Date Initiated: 11/10/2022 Target Resolution Date: 01/05/2023 Goal Status: Active Patient will remain free of pressure ulcers Date Initiated: 11/10/2022 Target Resolution Date: 01/05/2023 Goal Status: Active Patient/caregiver will verbalize risk factors for pressure ulcer  development Date Initiated: 11/10/2022 Date Inactivated: 11/10/2022 Target Resolution Date: 11/10/2022 Goal Status: Met Patient/caregiver will verbalize understanding of pressure ulcer management Date Initiated: 11/10/2022 Date Inactivated: 11/10/2022 Target Resolution Date: 11/10/2022 Goal Status: Met Interventions: Assess: immobility, friction, shearing, incontinence upon admission and as needed Assess offloading mechanisms upon admission and as needed Assess potential for pressure ulcer upon admission and as needed Provide education on pressure ulcers Notes: Wound/Skin Impairment Nursing Diagnoses: Impaired tissue integrity Knowledge deficit related to ulceration/compromised skin integrity Goals: Ulcer/skin breakdown will have a volume reduction of 30% by week 4 Date Initiated: 11/10/2022 Date Inactivated: 12/12/2022 Target Resolution Date: 12/08/2022 Goal Status: Met Ulcer/skin breakdown will have a volume reduction of 50% by week 8 Date Initiated: 11/10/2022 Target Resolution Date: 01/05/2023 Goal Status: Active Ulcer/skin breakdown will have a volume reduction of 80% by week 12 Date Initiated: 11/10/2022 Target Resolution Date: 02/02/2023 Goal Status: Active Ulcer/skin breakdown will heal within 14 weeks Date Initiated: 11/10/2022 Target Resolution Date: 02/16/2023 Goal Status: Active Interventions: Assess patient/caregiver ability to obtain necessary supplies Assess patient/caregiver ability to perform ulcer/skin care regimen upon admission and as needed Assess ulceration(s) every  dressing daily until wound vac re applied Secondary Dressing ABD Pad 5x9 (in/in) Discharge Instruction: Cover with ABD pad Secured With Medipore T - 89M Medipore H Soft Cloth Surgical T ape ape, 2x2 (in/yd) Compression Wrap Compression Stockings Add-Ons Electronic Signature(s) Signed: 12/26/2022 4:25:48 PM By: [name] [name] Entered By: [name] [name] on 12/26/2022 15:14:38 Mikael Spray ([id number]) [id number] Page 10 of 11 -------------------------------------------------------------------------------- Wound Assessment Details Patient Name: Date of Service: [name] [name], [name] [name] 12/26/2022 3:00 PM Medical Record Number: [id number] Patient Account Number: [id number] Date of Birth/Sex: Treating RN: March 19, 1932 (89 y.o. [name] [name] Primary Care Charmian Forbis: [name] [name] Other Clinician: Referring Emet Rafanan: Treating Dexton Zwilling/Extender: [name] [name] in Treatment: 6 Wound Status Wound Number: 6 Primary Trauma, Other Etiology: Wound Location: Right Lower Leg Wound Open Wounding Event: Skin Tear/Laceration Status: Date Acquired: 11/15/2022 Comorbid Cataracts, Anemia, Asthma, Congestive Heart Failure, Weeks Of Treatment: 4 History: Hypertension, Type II Diabetes, Osteoarthritis Clustered Wound: No Photos Wound Measurements Length: (cm) 0.8 Width: (cm)  0.7 Depth: (cm) 0.1 Area: (cm) 0.44 Volume: (cm) 0.044 % Reduction in Area: 84.8% % Reduction in Volume: 84.8% Epithelialization: Medium (34-66%) Tunneling: No Undermining: No Wound Description Classification: Full Thickness Without Exposed Support Structures Exudate Amount: Medium Exudate Type: Serosanguineous Exudate Color: red, brown Foul Odor After Cleansing: No Slough/Fibrino Yes Wound Bed Granulation Amount: Large (67-100%) Exposed Structure Granulation Quality: Red Fat Layer (Subcutaneous Tissue) Exposed: No Necrotic Amount: Small (1-33%) Necrotic Quality: Adherent Slough Treatment Notes Wound #6 (Lower Leg) Wound Laterality: Right Cleanser Wound Cleanser Discharge Instruction: Wash your hands with soap and water. Remove old dressing, discard into plastic bag and place into trash. Cleanse the wound with Wound Cleanser prior to applying a clean dressing using gauze sponges, not tissues or cotton balls. Do not scrub or use excessive force. Pat dry using gauze sponges, not tissue or cotton balls. Peri-Wound Care [name] [name], [name] [name] ([id number]) [id number] Page 11 of 11 Topical Primary Dressing Xeroform 4x4-HBD (in/in) Discharge Instruction: Apply Xeroform 4x4-HBD (in/in) as directed Secondary Dressing (BORDER) Zetuvit Plus SILICONE BORDER Dressing 4x4 (in/in) Discharge Instruction: Please do not put silicone bordered dressings under wraps. Use non-bordered dressing only. Secured With Compression Wrap Compression Stockings Add-Ons Electronic Signature(s) Signed: 12/26/2022 4:25:48 PM By: [name] [name] Entered By: [name] [name] on 12/26/2022 15:15:03 -------------------------------------------------------------------------------- Vitals Details Patient Name: Date of Service: [name] 12/26/2022 3:00 PM Medical Record Number: [id number] Patient Account Number: [id number] Date of Birth/Sex: Treating RN: 12-11-1932 (89 y.o. [name] [name] Primary Care Lakoda Mcanany: [name] [name] Other Clinician: Referring Cache Bills: Treating Arseniy Toomey/Extender: [name] [name] in Treatment: 6 Vital Signs Time Taken: 15:10 Temperature (F): 97.8 Height (in): 66 Pulse (bpm): 74 Weight (lbs): 191 Respiratory Rate (breaths/min): 18 Body Mass Index (BMI): 30.8 Blood Pressure (mmHg): 135/57 Reference Range: 80 - 120 mg / dl Electronic Signature(s) Signed: 12/26/2022 3:20:16 PM By: [name] [name] Entered By: [name] [name] on 12/26/2022 15:20:15  dressing daily until wound vac re applied Secondary Dressing ABD Pad 5x9 (in/in) Discharge Instruction: Cover with ABD pad Secured With Medipore T - 89M Medipore H Soft Cloth Surgical T ape ape, 2x2 (in/yd) Compression Wrap Compression Stockings Add-Ons Electronic Signature(s) Signed: 12/26/2022 4:25:48 PM By: [name] [name] Entered By: [name] [name] on 12/26/2022 15:14:38 Mikael Spray ([id number]) [id number] Page 10 of 11 -------------------------------------------------------------------------------- Wound Assessment Details Patient Name: Date of Service: [name] [name], [name] [name] 12/26/2022 3:00 PM Medical Record Number: [id number] Patient Account Number: [id number] Date of Birth/Sex: Treating RN: March 19, 1932 (89 y.o. [name] [name] Primary Care Charmian Forbis: [name] [name] Other Clinician: Referring Emet Rafanan: Treating Dexton Zwilling/Extender: [name] [name] in Treatment: 6 Wound Status Wound Number: 6 Primary Trauma, Other Etiology: Wound Location: Right Lower Leg Wound Open Wounding Event: Skin Tear/Laceration Status: Date Acquired: 11/15/2022 Comorbid Cataracts, Anemia, Asthma, Congestive Heart Failure, Weeks Of Treatment: 4 History: Hypertension, Type II Diabetes, Osteoarthritis Clustered Wound: No Photos Wound Measurements Length: (cm) 0.8 Width: (cm)  0.7 Depth: (cm) 0.1 Area: (cm) 0.44 Volume: (cm) 0.044 % Reduction in Area: 84.8% % Reduction in Volume: 84.8% Epithelialization: Medium (34-66%) Tunneling: No Undermining: No Wound Description Classification: Full Thickness Without Exposed Support Structures Exudate Amount: Medium Exudate Type: Serosanguineous Exudate Color: red, brown Foul Odor After Cleansing: No Slough/Fibrino Yes Wound Bed Granulation Amount: Large (67-100%) Exposed Structure Granulation Quality: Red Fat Layer (Subcutaneous Tissue) Exposed: No Necrotic Amount: Small (1-33%) Necrotic Quality: Adherent Slough Treatment Notes Wound #6 (Lower Leg) Wound Laterality: Right Cleanser Wound Cleanser Discharge Instruction: Wash your hands with soap and water. Remove old dressing, discard into plastic bag and place into trash. Cleanse the wound with Wound Cleanser prior to applying a clean dressing using gauze sponges, not tissues or cotton balls. Do not scrub or use excessive force. Pat dry using gauze sponges, not tissue or cotton balls. Peri-Wound Care [name] [name], [name] [name] ([id number]) [id number] Page 11 of 11 Topical Primary Dressing Xeroform 4x4-HBD (in/in) Discharge Instruction: Apply Xeroform 4x4-HBD (in/in) as directed Secondary Dressing (BORDER) Zetuvit Plus SILICONE BORDER Dressing 4x4 (in/in) Discharge Instruction: Please do not put silicone bordered dressings under wraps. Use non-bordered dressing only. Secured With Compression Wrap Compression Stockings Add-Ons Electronic Signature(s) Signed: 12/26/2022 4:25:48 PM By: [name] [name] Entered By: [name] [name] on 12/26/2022 15:15:03 -------------------------------------------------------------------------------- Vitals Details Patient Name: Date of Service: [name] 12/26/2022 3:00 PM Medical Record Number: [id number] Patient Account Number: [id number] Date of Birth/Sex: Treating RN: 12-11-1932 (89 y.o. [name] [name] Primary Care Lakoda Mcanany: [name] [name] Other Clinician: Referring Cache Bills: Treating Arseniy Toomey/Extender: [name] [name] in Treatment: 6 Vital Signs Time Taken: 15:10 Temperature (F): 97.8 Height (in): 66 Pulse (bpm): 74 Weight (lbs): 191 Respiratory Rate (breaths/min): 18 Body Mass Index (BMI): 30.8 Blood Pressure (mmHg): 135/57 Reference Range: 80 - 120 mg / dl Electronic Signature(s) Signed: 12/26/2022 3:20:16 PM By: [name] [name] Entered By: [name] [name] on 12/26/2022 15:20:15  N/A Date Acquired: 6 4 N/A Weeks of Treatment: Open Open N/A Wound Status: No No N/A Wound Recurrence: 2.5x3.5x2 0.8x0.7x0.1 N/A Measurements L x W x D (cm) 6.872 0.44 N/A A (cm) : rea 13.744 0.044 N/A Volume (cm) : 76.10% 84.80% N/A % Reduction in A rea: 79.20% 84.80% N/A % Reduction in Volume: 3 Position 1 (o'clock): 1.9 Maximum Distance 1 (cm): 9 Position 2 (o'clock): 2.3 Maximum Distance 2 (cm): Yes No N/A Tunneling: Full Thickness Without Exposed Full Thickness Without Exposed N/A Classification: Support Structures Support Structures Medium Medium N/A Exudate A mount: Serosanguineous Serosanguineous N/A Exudate Type: red, brown red, brown N/A Exudate Color: Large (67-100%) Large (67-100%) N/A Granulation A mount: Red Red N/A Granulation Quality: None Present (0%) Small (1-33%) N/A Necrotic A mount: Fat Layer (Subcutaneous Tissue): Yes Fat Layer (Subcutaneous Tissue): No N/A Exposed Structures: None Medium (34-66%) N/A Epithelialization: Treatment Notes Electronic Signature(s) Signed: 12/26/2022 4:21:57 PM By: [name] [name] Previous Signature: 12/26/2022  3:21:48 PM Version By: [name] [name] Entered By: [name] [name] on 12/26/2022 16:21:56 -------------------------------------------------------------------------------- Multi-Disciplinary Care Plan Details Patient Name: Date of Service: [name] 12/26/2022 3:00 PM Medical Record Number: [id number] Patient Account Number: [id number] Date of Birth/Sex: Treating RN: [birth year] (89 y.o. [name] [name] Primary Care Jeziel Hoffmann: [name] [name] Other Clinician: Referring Audi Conover: Treating Fahd Galea/Extender: [name] [name] in Treatment: 6 Active Inactive Necrotic Tissue Nursing Diagnoses: Impaired tissue integrity related to necrotic/devitalized tissue Goals: Necrotic/devitalized tissue will be minimized in the wound bed Date Initiated: 11/10/2022 Target Resolution Date: 01/05/2023 Goal Status: Active Patient/caregiver will verbalize understanding of reason and process for debridement of necrotic tissue Date Initiated: 11/10/2022 Date Inactivated: 11/10/2022 Target Resolution Date: 11/10/2022 Goal Status: Met Interventions: KATALEYA, WATCHMAN ([id number]) [id number] Page 6 of 11 Assess patient pain level pre-, during and post procedure and prior to discharge Provide education on necrotic tissue and debridement process Treatment Activities: Excisional debridement : 11/10/2022 Notes: Pressure Nursing Diagnoses: Knowledge deficit related to causes and risk factors for pressure ulcer development Potential for impaired tissue integrity related to pressure, friction, moisture, and shear Goals: Patient will remain free from development of additional pressure ulcers Date Initiated: 11/10/2022 Target Resolution Date: 01/05/2023 Goal Status: Active Patient will remain free of pressure ulcers Date Initiated: 11/10/2022 Target Resolution Date: 01/05/2023 Goal Status: Active Patient/caregiver will verbalize risk factors for pressure ulcer  development Date Initiated: 11/10/2022 Date Inactivated: 11/10/2022 Target Resolution Date: 11/10/2022 Goal Status: Met Patient/caregiver will verbalize understanding of pressure ulcer management Date Initiated: 11/10/2022 Date Inactivated: 11/10/2022 Target Resolution Date: 11/10/2022 Goal Status: Met Interventions: Assess: immobility, friction, shearing, incontinence upon admission and as needed Assess offloading mechanisms upon admission and as needed Assess potential for pressure ulcer upon admission and as needed Provide education on pressure ulcers Notes: Wound/Skin Impairment Nursing Diagnoses: Impaired tissue integrity Knowledge deficit related to ulceration/compromised skin integrity Goals: Ulcer/skin breakdown will have a volume reduction of 30% by week 4 Date Initiated: 11/10/2022 Date Inactivated: 12/12/2022 Target Resolution Date: 12/08/2022 Goal Status: Met Ulcer/skin breakdown will have a volume reduction of 50% by week 8 Date Initiated: 11/10/2022 Target Resolution Date: 01/05/2023 Goal Status: Active Ulcer/skin breakdown will have a volume reduction of 80% by week 12 Date Initiated: 11/10/2022 Target Resolution Date: 02/02/2023 Goal Status: Active Ulcer/skin breakdown will heal within 14 weeks Date Initiated: 11/10/2022 Target Resolution Date: 02/16/2023 Goal Status: Active Interventions: Assess patient/caregiver ability to obtain necessary supplies Assess patient/caregiver ability to perform ulcer/skin care regimen upon admission and as needed Assess ulceration(s) every

## 2022-12-27 NOTE — Patient Instructions (Signed)
Medication Instructions:  Your physician recommends that you continue on your current medications as directed. Please refer to the Current Medication list given to you today.  *If you need a refill on your cardiac medications before your next appointment, please call your pharmacy*   Lab Work: CMET, Mag,  If you have labs (blood work) drawn today and your tests are completely normal, you will receive your results only by: MyChart Message (if you have MyChart) OR A paper copy in the mail If you have any lab test that is abnormal or we need to change your treatment, we will call you to review the results.   Testing/Procedures: None   Follow-Up: At [facility], you and your health needs are our priority.  As part of our continuing mission to provide you with exceptional heart care, we have created designated Provider Care Teams.  These Care Teams include your primary Cardiologist (physician) and Advanced Practice Providers (APPs -  Physician Assistants and Nurse Practitioners) who all work together to provide you with the care you need, when you need it.   Your next appointment:   3 month(s)  Provider:   Thomasene Ripple, DO

## 2022-12-28 LAB — COMPREHENSIVE METABOLIC PANEL
ALT: 12 [IU]/L (ref 0–32)
AST: 17 [IU]/L (ref 0–40)
Albumin: 4.2 g/dL (ref 3.7–4.7)
Alkaline Phosphatase: 69 [IU]/L (ref 44–121)
BUN/Creatinine Ratio: 33 — ABNORMAL HIGH (ref 12–28)
BUN: 52 mg/dL — ABNORMAL HIGH (ref 8–27)
Bilirubin Total: <0.2 mg/dL (ref 0.0–1.2)
CO2: 26 mmol/L (ref 20–29)
Calcium: 9.5 mg/dL (ref 8.7–10.3)
Chloride: 102 mmol/L (ref 96–106)
Creatinine, Ser: 1.57 mg/dL — ABNORMAL HIGH (ref 0.57–1.00)
Globulin, Total: 2.8 g/dL (ref 1.5–4.5)
Glucose: 211 mg/dL — ABNORMAL HIGH (ref 70–99)
Potassium: 4.7 mmol/L (ref 3.5–5.2)
Sodium: 146 mmol/L — ABNORMAL HIGH (ref 134–144)
Total Protein: 7 g/dL (ref 6.0–8.5)
eGFR: 32 mL/min/{1.73_m2} — ABNORMAL LOW (ref >59)

## 2022-12-28 LAB — MAGNESIUM: Magnesium: 1.5 mg/dL — ABNORMAL LOW (ref 1.6–2.3)

## 2022-12-28 NOTE — Progress Notes (Signed)
Cardiology Office Note:    Date:  12/28/2022   ID:  [name] [name], [name] [name] [birth year], MRN [id number]  PCP:  [name], MD  Cardiologist:  Thomasene Ripple, DO  Electrophysiologist:  None   Referring MD: [name], MD     History of Present Illness:    [name] [name] is a 89 y.o. female with a hx of chronic diastolic hypertension, diabetes mellitus type 2, CVA, dyslipidemia, CKD 3B, obesity, hypertension here today for follow-up visit.  Her last visit with me she was posthospitalization for [facility].  During that visit no medication was changed.  She was stable.  We got blood work and plan to follow-up in 4 months.  In the interim she had some low blood pressures and medications were adjusted.  She is here in office with her daughter.  She has been adhering to a medication regimen, which includes a fluid pill that was recently increased to twenty daily due to weight changes. The patient's weight is monitored daily and is ideally maintained around 185. The patient has been busy with daily activities and has been visiting various specialists, including a kidney doctor and an endocrinologist. The patient appreciates the strict routine set by the caregiver, which includes scheduled meals, medication times, and activities. The patient also mentions a recent visit to a wound center.  Past Medical History:  Diagnosis Date   Colon polyp    Diabetes mellitus 1992   Hypertension 1996   Pancreatitis     Past Surgical History:  Procedure Laterality Date   CHOLECYSTECTOMY     COLONOSCOPY     ERCP     INCISION AND DRAINAGE ABSCESS N/A 09/12/2022   Procedure: INCISION AND DRAINAGE ABSCESS;  Surgeon: Karie Soda, MD;  Location: [facility];  Service: General;  Laterality: N/A;   PARTIAL HYSTERECTOMY  89years old   POLYPECTOMY     UPPER GASTROINTESTINAL ENDOSCOPY     WRIST FRACTURE SURGERY  left arm,89years old    Current Medications: Current Meds  Medication Sig    acetaminophen (TYLENOL) 500 MG tablet Take 1,000 mg by mouth 2 (two) times daily.   albuterol (VENTOLIN HFA) 108 (90 Base) MCG/ACT inhaler Inhale 2 puffs into the lungs every 6 (six) hours as needed for wheezing or shortness of breath.   amantadine (SYMMETREL) 100 MG capsule Take 1 capsule (100 mg total) by mouth daily.   amLODipine (NORVASC) 5 MG tablet Take 1 tablet (5 mg total) by mouth daily.   ASPIRIN 81 PO Take 81 mg by mouth in the morning.   atorvastatin (LIPITOR) 40 MG tablet Take 1 tablet (40 mg total) by mouth daily.   B Complex-C (B-COMPLEX WITH VITAMIN C) tablet Take 1 tablet by mouth daily.   bisacodyl 5 MG EC tablet Take 5 mg by mouth daily as needed for moderate constipation.   diclofenac Sodium (VOLTAREN ARTHRITIS PAIN) 1 % GEL Apply 2 g topically 4 (four) times daily. (Patient taking differently: Apply 4 g topically 3 (three) times daily. Right knee)   ferrous sulfate 325 (65 FE) MG tablet Take 325 mg by mouth every other day.   Glucerna (GLUCERNA) LIQD Take 237 mLs by mouth daily.   glucose blood (ACCU-CHEK GUIDE) test strip 1 each by Other route in the morning, at noon, in the evening, and at bedtime.   hydrALAZINE (APRESOLINE) 25 MG tablet Take 1 tablet (25 mg total) by mouth 2 (two) times daily. Take with Hydralazine 50 mg tablet, to equal 75 mg  dose twice a day   hydrALAZINE (APRESOLINE) 50 MG tablet Take 1 tablet (50 mg total) by mouth 2 (two) times daily. Take with Hydralazine 25 mg tablet, to equal 75 mg dose twice a day   insulin lispro (HUMALOG) 100 UNIT/ML injection Inject 14-26 Units into the skin 4 (four) times daily.   Insulin Syringe-Needle U-100 31G X 5/16" 1 ML MISC 1 application by Does not apply route 2 (two) times daily.   ipratropium-albuterol (DUONEB) 0.5-2.5 (3) MG/3ML SOLN Take 3 mLs by nebulization as needed.   LANTUS SOLOSTAR 100 UNIT/ML Solostar Pen Inject 10 Units into the skin 2 (two) times daily. (Patient taking differently: Inject 19 Units into the  skin 2 (two) times daily.)   lidocaine (LIDODERM) 5 % APPLY (1) PATCH TO LOWER BACK AND RIGHT KNEE ONCE DAILY. LEAVE ON 12 HOURS. LEAVE OFF 12 HOURS. (Patient taking differently: Place 1 patch onto the skin See admin instructions. Apply 1 patch to the lower back and right knee once a day- on 12 hours/off 12 hours)   methocarbamol (ROBAXIN) 500 MG tablet Take 250 mg by mouth at bedtime as needed for muscle spasms.   polyethylene glycol (MIRALAX / GLYCOLAX) 17 g packet Take 17 g by mouth 2 (two) times daily as needed for mild constipation or moderate constipation.   QUEtiapine (SEROQUEL) 25 MG tablet Take 0.5 tablets (12.5 mg total) by mouth at bedtime as needed (anxiety).   senna-docusate (SENOKOT-S) 8.6-50 MG tablet Take 1 tablet by mouth 2 (two) times daily.   sodium hypochlorite (DAKIN'S 1/4 STRENGTH) 0.125 % SOLN Irrigate with 1 Application as directed every Monday, Wednesday, and Friday.   traMADol (ULTRAM) 50 MG tablet Take 50 mg by mouth at bedtime as needed for moderate pain.   traMADol-acetaminophen (ULTRACET) 37.5-325 MG tablet Take 1 tablet by mouth 2 (two) times daily.     Allergies:   Amlodipine, Ace inhibitors, Atorvastatin, Canagliflozin, Clonidine hcl, Buprenorphine, Lisinopril, and Morphine and codeine   Social History   Socioeconomic History   Marital status: Widowed    Spouse name: Not on file   Number of children: Not on file   Years of education: Not on file   Highest education level: Not on file  Occupational History   Not on file  Tobacco Use   Smoking status: Never   Smokeless tobacco: Never  Vaping Use   Vaping status: Never Used  Substance and Sexual Activity   Alcohol use: Never   Drug use: Never   Sexual activity: Not on file  Other Topics Concern   Not on file  Social History Narrative   Not on file   Social Determinants of Health   Financial Resource Strain: Low Risk  (06/14/2022)   Received from [facility], [facility]   Overall Financial  Resource Strain (CARDIA)    Difficulty of Paying Living Expenses: Not hard at all  Food Insecurity: No Food Insecurity (11/02/2022)   Hunger Vital Sign    Worried About Running Out of Food in the Last Year: Never true    Ran Out of Food in the Last Year: Never true  Transportation Needs: No Transportation Needs (11/02/2022)   PRAPARE - Administrator, Civil Service (Medical): No    Lack of Transportation (Non-Medical): No  Physical Activity: Inactive (05/19/2019)   Received from [facility], [facility]   Exercise Vital Sign    Days of Exercise per Week: 0 days    Minutes of Exercise per Session: 0  min  Stress: No Stress Concern Present (12/05/2021)   Received from [facility], [facility] - Occupational Stress Questionnaire    Feeling of Stress : Not at all  Social Connections: Unknown (12/08/2022)   Received from [facility]   Social Network    Social Network: Not on file     Family History: The patient's family history is not on file.  ROS:   Review of Systems  Constitution: Negative for decreased appetite, fever and weight gain.  HENT: Negative for congestion, ear discharge, hoarse voice and sore throat.   Eyes: Negative for discharge, redness, vision loss in right eye and visual halos.  Cardiovascular: Negative for chest pain, dyspnea on exertion, leg swelling, orthopnea and palpitations.  Respiratory: Negative for cough, hemoptysis, shortness of breath and snoring.   Endocrine: Negative for heat intolerance and polyphagia.  Hematologic/Lymphatic: Negative for bleeding problem. Does not bruise/bleed easily.  Skin: Negative for flushing, nail changes, rash and suspicious lesions.  Musculoskeletal: Negative for arthritis, joint pain, muscle cramps, myalgias, neck pain and stiffness.  Gastrointestinal: Negative for abdominal pain, bowel incontinence, diarrhea and excessive appetite.  Genitourinary: Negative for  decreased libido, genital sores and incomplete emptying.  Neurological: Negative for brief paralysis, focal weakness, headaches and loss of balance.  Psychiatric/Behavioral: Negative for altered mental status, depression and suicidal ideas.  Allergic/Immunologic: Negative for HIV exposure and persistent infections.    EKGs/Labs/Other Studies Reviewed:    The following studies were reviewed today:   EKG:  None today   Recent Labs: 11/03/2022: B Natriuretic Peptide 440.7 11/04/2022: Hemoglobin 9.4; Platelets 245 12/27/2022: ALT 12; BUN 52; Creatinine, Ser 1.57; Magnesium 1.5; Potassium 4.7; Sodium 146  Recent Lipid Panel    Component Value Date/Time   CHOL 201 (H) 01/04/2020 1601   TRIG 398 (H) 01/06/2020 0512   HDL 37 (L) 01/04/2020 1601   CHOLHDL 5.4 01/04/2020 1601   VLDL UNABLE TO CALCULATE IF TRIGLYCERIDE OVER 400 mg/dL 95/62/1308 6578   LDLCALC UNABLE TO CALCULATE IF TRIGLYCERIDE OVER 400 mg/dL 46/96/2952 8413   LDLDIRECT 121.8 (H) 01/04/2020 1601    Physical Exam:    VS:  BP (!) 120/42 (BP Location: Left Arm, Patient Position: Sitting, Cuff Size: Normal)   Pulse 69   Ht 5\' 6"  (1.676 m)   Wt 186 lb 3.2 oz (84.5 kg)   SpO2 96%   BMI 30.05 kg/m     Wt Readings from Last 3 Encounters:  12/27/22 186 lb 3.2 oz (84.5 kg)  11/13/22 186 lb 3.2 oz (84.5 kg)  11/04/22 205 lb (93 kg)     GEN: Well nourished, well developed in no acute distress HEENT: Normal NECK: No JVD; No carotid bruits LYMPHATICS: No lymphadenopathy CARDIAC: S1S2 noted,RRR, no murmurs, rubs, gallops RESPIRATORY:  Clear to auscultation without rales, wheezing or rhonchi  ABDOMEN: Soft, non-tender, non-distended, +bowel sounds, no guarding. EXTREMITIES: No edema, No cyanosis, no clubbing MUSCULOSKELETAL:  No deformity  SKIN: Warm and dry NEUROLOGIC:  Alert and oriented x 3, non-focal PSYCHIATRIC:  Normal affect, good insight  ASSESSMENT:    1. Medication management   2. Chronic diastolic congestive  heart failure (HCC)   3. Stage 3b chronic kidney disease (HCC)   4. Morbid obesity (HCC)   5. Essential hypertension    PLAN:    Chronic diastolic heart failure Weight stable around 185 lbs with daily Lasix 20mg . Discussed the importance of maintaining weight around this range to prevent fluid overload. No symptoms  of hypotension reported. -Continue daily Lasix 20mg . -Monitor weight daily and adjust Lasix dose as needed to maintain weight around 185 lbs.  Hypertension-blood pressure stable.  No medication changes  Chronic kidney disease-avoid nephrotoxins.  The patient understands the need to lose weight with diet and exercise. We have discussed specific strategies for this.  Follow-up No new complaints or changes in condition reported. Recent visits to nephrologist and endocrinologist. -Continue current management plan. -Check blood work today. -Follow-up in three months or sooner if any changes in conditio   The patient is in agreement with the above plan. The patient left the office in stable condition.  The patient will follow up in 4 months or sooner if needed.   Medication Adjustments/Labs and Tests Ordered: Current medicines are reviewed at length with the patient today.  Concerns regarding medicines are outlined above.  Orders Placed This Encounter  Procedures   Comprehensive Metabolic Panel (CMET)   Magnesium   No orders of the defined types were placed in this encounter.   Patient Instructions  Medication Instructions:  Your physician recommends that you continue on your current medications as directed. Please refer to the Current Medication list given to you today.  *If you need a refill on your cardiac medications before your next appointment, please call your pharmacy*   Lab Work: CMET, Mag,  If you have labs (blood work) drawn today and your tests are completely normal, you will receive your results only by: MyChart Message (if you have MyChart) OR A  paper copy in the mail If you have any lab test that is abnormal or we need to change your treatment, we will call you to review the results.   Testing/Procedures: None   Follow-Up: At [facility], you and your health needs are our priority.  As part of our continuing mission to provide you with exceptional heart care, we have created designated Provider Care Teams.  These Care Teams include your primary Cardiologist (physician) and Advanced Practice Providers (APPs -  Physician Assistants and Nurse Practitioners) who all work together to provide you with the care you need, when you need it.   Your next appointment:   3 month(s)  Provider:   Thomasene Ripple, DO    Adopting a Healthy Lifestyle.  Know what a healthy weight is for you (roughly BMI <25) and aim to maintain this   Aim for 7+ servings of fruits and vegetables daily   65-80+ fluid ounces of water or unsweet tea for healthy kidneys   Limit to max 1 drink of alcohol per day; avoid smoking/tobacco   Limit animal fats in diet for cholesterol and heart health - choose grass fed whenever available   Avoid highly processed foods, and foods high in saturated/trans fats   Aim for low stress - take time to unwind and care for your mental health   Aim for 150 min of moderate intensity exercise weekly for heart health, and weights twice weekly for bone health   Aim for 7-9 hours of sleep daily   When it comes to diets, agreement about the perfect plan isnt easy to find, even among the experts. Experts at the [facility] of [employer] developed an idea known as the Healthy Eating Plate. Just imagine a plate divided into logical, healthy portions.   The emphasis is on diet quality:   Load up on vegetables and fruits - one-half of your plate: Aim for color and variety, and remember that potatoes dont count.  Go for whole grains - one-quarter of your plate: Whole wheat, barley, wheat berries, quinoa, oats, brown  rice, and foods made with them. If you want pasta, go with whole wheat pasta.   Protein power - one-quarter of your plate: Fish, chicken, beans, and nuts are all healthy, versatile protein sources. Limit red meat.   The diet, however, does go beyond the plate, offering a few other suggestions.   Use healthy plant oils, such as olive, canola, soy, corn, sunflower and peanut. Check the labels, and avoid partially hydrogenated oil, which have unhealthy trans fats.   If youre thirsty, drink water. Coffee and tea are good in moderation, but skip sugary drinks and limit milk and dairy products to one or two daily servings.   The type of carbohydrate in the diet is more important than the amount. Some sources of carbohydrates, such as vegetables, fruits, whole grains, and beans-are healthier than others.   Finally, stay active  Signed, Thomasene Ripple, DO  12/28/2022 6:57 PM    [facility]

## 2022-12-29 ENCOUNTER — Other Ambulatory Visit: Payer: Self-pay

## 2022-12-29 DIAGNOSIS — Z79899 Other long term (current) drug therapy: Secondary | ICD-10-CM

## 2022-12-29 MED ORDER — MAGNESIUM OXIDE 400 MG PO CAPS: 400.0000 mg | ORAL_CAPSULE | Freq: Two times a day (BID) | ORAL | 0 refills | Status: AC

## 2023-01-15 ENCOUNTER — Other Ambulatory Visit: Payer: Self-pay

## 2023-01-15 DIAGNOSIS — Z79899 Other long term (current) drug therapy: Secondary | ICD-10-CM

## 2023-01-16 ENCOUNTER — Encounter: Payer: Medicare Other | Attending: Physician Assistant | Admitting: Physician Assistant

## 2023-01-16 DIAGNOSIS — E1122 Type 2 diabetes mellitus with diabetic chronic kidney disease: Secondary | ICD-10-CM | POA: Insufficient documentation

## 2023-01-16 DIAGNOSIS — I130 Hypertensive heart and chronic kidney disease with heart failure and stage 1 through stage 4 chronic kidney disease, or unspecified chronic kidney disease: Secondary | ICD-10-CM | POA: Insufficient documentation

## 2023-01-16 DIAGNOSIS — D631 Anemia in chronic kidney disease: Secondary | ICD-10-CM | POA: Diagnosis not present

## 2023-01-16 DIAGNOSIS — L98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: Secondary | ICD-10-CM | POA: Insufficient documentation

## 2023-01-16 DIAGNOSIS — M1990 Unspecified osteoarthritis, unspecified site: Secondary | ICD-10-CM | POA: Insufficient documentation

## 2023-01-16 DIAGNOSIS — I5042 Chronic combined systolic (congestive) and diastolic (congestive) heart failure: Secondary | ICD-10-CM | POA: Diagnosis not present

## 2023-01-16 DIAGNOSIS — E1151 Type 2 diabetes mellitus with diabetic peripheral angiopathy without gangrene: Secondary | ICD-10-CM | POA: Insufficient documentation

## 2023-01-16 DIAGNOSIS — S81811A Laceration without foreign body, right lower leg, initial encounter: Secondary | ICD-10-CM | POA: Insufficient documentation

## 2023-01-16 DIAGNOSIS — Z8673 Personal history of transient ischemic attack (TIA), and cerebral infarction without residual deficits: Secondary | ICD-10-CM | POA: Diagnosis not present

## 2023-01-16 DIAGNOSIS — N183 Chronic kidney disease, stage 3 unspecified: Secondary | ICD-10-CM | POA: Diagnosis not present

## 2023-01-16 DIAGNOSIS — E11622 Type 2 diabetes mellitus with other skin ulcer: Secondary | ICD-10-CM | POA: Insufficient documentation

## 2023-01-16 LAB — COMPREHENSIVE METABOLIC PANEL
ALT: 12 [IU]/L (ref 0–32)
AST: 17 [IU]/L (ref 0–40)
Albumin: 3.9 g/dL (ref 3.7–4.7)
Alkaline Phosphatase: 66 [IU]/L (ref 44–121)
BUN/Creatinine Ratio: 24 (ref 12–28)
BUN: 47 mg/dL — ABNORMAL HIGH (ref 8–27)
Bilirubin Total: <0.2 mg/dL (ref 0.0–1.2)
CO2: 26 mmol/L (ref 20–29)
Calcium: 9.6 mg/dL (ref 8.7–10.3)
Chloride: 101 mmol/L (ref 96–106)
Creatinine, Ser: 1.95 mg/dL — ABNORMAL HIGH (ref 0.57–1.00)
Globulin, Total: 3.1 g/dL (ref 1.5–4.5)
Glucose: 121 mg/dL — ABNORMAL HIGH (ref 70–99)
Potassium: 4.6 mmol/L (ref 3.5–5.2)
Sodium: 145 mmol/L — ABNORMAL HIGH (ref 134–144)
Total Protein: 7 g/dL (ref 6.0–8.5)
eGFR: 24 mL/min/{1.73_m2} — ABNORMAL LOW (ref >59)

## 2023-01-16 LAB — MAGNESIUM: Magnesium: 1.7 mg/dL (ref 1.6–2.3)

## 2023-01-16 NOTE — Progress Notes (Addendum)
IVY, SPRITZER ([id number]) [id number] Page 1 of 7 Visit Report for 01/16/2023 Chief Complaint Document Details Patient Name: Date of Service: [name], [name] 01/16/2023 3:30 PM Medical Record Number: [id number] Patient Account Number: [id number] Date of Birth/Sex: Treating RN: 08-18-1932 (89 y.o. [name] Primary Care Provider: [name] Other Clinician: Referring Provider: Treating Provider/Extender: Lavonia Drafts in Treatment: 9 Information Obtained from: Patient Chief Complaint Bilateral Gluteal Ulcers and right leg skin tear Electronic Signature(s) Signed: 01/16/2023 3:40:18 PM By: Allen Derry PA-C Entered By: Allen Derry on 01/16/2023 15:40:18 -------------------------------------------------------------------------------- HPI Details Patient Name: Date of Service: [name] [name] [name] 01/16/2023 3:30 PM Medical Record Number: [id number] Patient Account Number: [id number] Date of Birth/Sex: Treating RN: [birth year] (89 y.o. [name] Primary Care Provider: [name] Other Clinician: Referring Provider: Treating Provider/Extender: Lavonia Drafts in Treatment: 9 History of Present Illness HPI Description: 11-10-2022 upon evaluation today patient presents for initial inspection here in our clinic concerning a wound which occurred unfortunately as a result of an abscess. This was the main right gluteal/sacral region. With that being said she noted this first around or just before July 12 when she was admitted to the hospital from the 12th through the 26. The wound was at that point drained and subsequently she thought that she was headed in the right direction. With that being said she ended up having to be admitted to the hospital August and this was due to a CHF exacerbation with a took 25 pounds of fluid off of her. Subsequently she was then rehab for the wound at [facility] burn and the physician  there apparently did an awesome job based on what I am seeing the wound actually looks like is doing quite well at this point. They were doing a fairly complex VAC with the small openings in multiple areas that were being backed as well at this point the good news is I do not think that he would need to be necessary based on what I am seeing. She does have home health coming out to do the wound VAC changes Monday, Wednesday, and Friday although get this scheduled going has been somewhat a concern due to the fact that the patient is been in and out of the hospital. Patient does have a history of having had a stroke in 2001 with some left-sided weakness. She also on September 08, 2022 had her A1c checked this was 9.8. She is a diabetic. Other than these 2 conditions she also has chronic kidney disease stage III, hypertension, and again obviously the wounds which were can be helping to manage at this point. 11-28-2022 upon evaluation today patient appears to be doing well currently in regard to her wound in the sacral area. Most of the surrounding wounds are actually closed and they look excellent. I am actually very pleased with where we stand. The only downside is she does have a skin tear which is still questionable whether it is going to resolved with a skin flap intact or if it is going skin flap did great and breakdown we will know more as it goes over the next SHEVAWN, HOFER ([id number]) [id number] Page 2 of 7 couple of weeks. Nonetheless right now this actually does not look too bad definitely no signs of infection this is on the right lower leg. Otherwise the patient seems to be doing extremely well with regard to the wound VAC I think that this is doing an awesome job  I think putting some collagen down to the tunnel would be beneficial I think the black foam is still better than the white foam to be honest. 12-12-2022 upon evaluation today patient appears to be doing  pretty well currently in regard to her wound. She has been tolerating the wound VAC without complication and seems to be making good progress here. Fortunately I do not see any signs of infection locally or systemically which is great news and in general I do believe that we are making excellent headway towards closure which is great news as well. No fevers, chills, nausea, vomiting, or diarrhea. 12-26-2022 upon evaluation today patient's wound does appear to be doing much better. There does not appear to be any signs of active infection which is great news and in general I think that her making excellent headway towards closure which is great news. No fevers, chills, nausea, vomiting, or diarrhea. 01-16-2023 upon evaluation today patient appears to be doing well currently in regard to her wounds the leg is healed and the gluteal region is significantly improved. I really do not think there is much that a wound VAC can continue to help with at this point. I discussed that with the patient today. Electronic Signature(s) Signed: 01/16/2023 3:57:16 PM By: Allen Derry PA-C Entered By: Allen Derry on 01/16/2023 15:57:16 -------------------------------------------------------------------------------- Physical Exam Details Patient Name: Date of Service: [name], [name] 01/16/2023 3:30 PM Medical Record Number: [id number] Patient Account Number: [id number] Date of Birth/Sex: Treating RN: 1932-07-10 (89 y.o. [name] Primary Care Provider: [name] Other Clinician: Referring Provider: Treating Provider/Extender: Lavonia Drafts in Treatment: 9 Constitutional Obese and well-hydrated in no acute distress. Respiratory normal breathing without difficulty. Psychiatric this patient is able to make decisions and demonstrates good insight into disease process. Alert and Oriented x 3. pleasant and cooperative. Notes Upon inspection patient's wound bed actually showed signs of  good granulation epithelization at this point. Fortunately I do not see any signs of active infection at this time which is great news and in general I do believe that we will make an excellent headway towards closure I think that the wound in general is doing better she does have some undermining in 1 spot and we can use some Hydrofera Blue rope there and then put Hydrofera Blue in general over top of the wound this change 3 times per week. I think that is can be a good way to go here.. Electronic Signature(s) Signed: 01/16/2023 3:57:47 PM By: Allen Derry PA-C Entered By: Allen Derry on 01/16/2023 15:57:47 -------------------------------------------------------------------------------- Physician Orders Details Patient Name: Date of Service: [name] [name] [name] 01/16/2023 3:30 PM Mikael Spray ([id number]) [id number] Page 3 of 7 Medical Record Number: [id number] Patient Account Number: [id number] Date of Birth/Sex: Treating RN: 1932/12/15 (89 y.o. 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 Home Health Home Health Company: - [facility] for wound care. May utilize formulary equivalent dressing for wound treatment orders unless otherwise specified. Home Health Nurse may visit PRN to address patients wound care needs. - Place a small skinny piece of hydrofera blue rope into the tunnel and a small piece of HB over the wound. Cover with a zetuvit or 4 x 4 foam dressing. Change 3 x week. Scheduled days for dressing changes to be completed; exception, patient has scheduled wound care visit that day. **Please direct any NON-WOUND related issues/requests for orders to patient's Primary Care Physician. **If current dressing causes regression in wound condition, may D/C ordered dressing product/s and apply Normal Saline Moist Dressing daily until next Wound Healing Center or Other MD appointment. **Notify Wound Healing Center of regression in wound condition at [phone]. Bathing/ Shower/ Hygiene No tub bath. Anesthetic (Use 'Patient Medications' Section for Anesthetic Order Entry) Lidocaine applied to wound bed Off-Loading Turn and reposition every 2 hours Other: - stay off bottom as much as possible Negative Pressure Wound Therapy Discontinue NPWT. Wound Treatment Wound #1 - Gluteus Wound Laterality: Right, Medial Cleanser: Vashe 5.8 (oz) 1 x Per Day/15 Days Discharge Instructions: Use vashe 5.8 (oz) as directed Prim Dressing: Hydrofera Blue Ready Transfer Foam, 2.5x2.5 (in/in) 1 x Per Day/15 Days ary Discharge Instructions: Apply Hydrofera Blue Ready to wound bed as directed Prim Dressing: Hydrofera rope ary 1 x Per Day/15 Days Secondary Dressing: (BORDER) Zetuvit Plus SILICONE BORDER Dressing 4x4 (in/in) 1 x Per Day/15 Days Discharge Instructions: Please do not put silicone bordered dressings under wraps. Use non-bordered dressing only. Electronic Signature(s) Signed: 01/18/2023 1:29:03 PM By: Allen Derry PA-C Signed: 01/19/2023  12:46:12 PM By: Midge Aver MSN RN CNS WTA Entered By: Midge Aver on 01/16/2023 16:22:39 Mikael Spray ([id number]) [id number] Page 4 of 7 -------------------------------------------------------------------------------- Problem List Details Patient Name: Date of Service: GABINA, TAKHAR 01/16/2023 3:30 PM Medical Record Number: [id number] Patient Account Number: [id number] Date of Birth/Sex: Treating RN: [birth year] (89 y.o. 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 without 11/10/2022 No Yes evidence of necrosis E11.622 Type 2 diabetes mellitus with other skin ulcer 11/10/2022 No Yes I10 Essential (primary) hypertension 11/10/2022 No Yes I50.42 Chronic combined systolic (congestive) and diastolic (congestive) heart failure 11/10/2022 No Yes N18.30 Chronic kidney disease, stage 3 unspecified 11/10/2022 No Yes S81.811A Laceration without foreign body, right lower leg, initial encounter 11/28/2022 No Yes Inactive Problems Resolved Problems Electronic Signature(s) Signed: 01/16/2023 3:40:16 PM By: Allen Derry PA-C Entered By: Allen Derry on 01/16/2023 15:40:16 Mikael Spray ([id number]) [id number] Page 5 of 7 -------------------------------------------------------------------------------- Progress Note Details Patient Name: Date of Service: JEILANI, PALM 01/16/2023 3:30 PM Medical Record Number: [id number] Patient Account Number: [id number] Date of Birth/Sex: Treating  RN: 1932/09/16 (89 y.o. [name] Primary Care Provider: [name] Other Clinician: Referring Provider: Treating Provider/Extender: Lavonia Drafts in Treatment: 9 Subjective Chief Complaint Information obtained from Patient Bilateral Gluteal Ulcers and right leg skin tear History of Present Illness (HPI) 11-10-2022 upon evaluation today patient presents for initial inspection here in our clinic concerning  a wound which occurred unfortunately as a result of an abscess. This was the main right gluteal/sacral region. With that being said she noted this first around or just before July 12 when she was admitted to the hospital from the 12th through the 26. The wound was at that point drained and subsequently she thought that she was headed in the right direction. With that being said she ended up having to be admitted to the hospital August and this was due to a CHF exacerbation with a took 25 pounds of fluid off of her. Subsequently she was then rehab for the wound at [facility] burn and the physician there apparently did an awesome job based on what I am seeing the wound actually looks like is doing quite well at this point. They were doing a fairly complex VAC with the small openings in multiple areas that were being backed as well at this point the good news is I do not think that he would need to be necessary based on what I am seeing. She does have home health coming out to do the wound VAC changes Monday, Wednesday, and Friday although get this scheduled going has been somewhat a concern due to the fact that the patient is been in and out of the hospital. Patient does have a history of having had a stroke in 2001 with some left-sided weakness. She also on September 08, 2022 had her A1c checked this was 9.8. She is a diabetic. Other than these 2 conditions she also has chronic kidney disease stage III, hypertension, and again obviously the wounds which were can be helping to  manage at this point. 11-28-2022 upon evaluation today patient appears to be doing well currently in regard to her wound in the sacral area. Most of the surrounding wounds are actually closed and they look excellent. I am actually very pleased with where we stand. The only downside is she does have a skin tear which is still questionable whether it is going to resolved with a skin flap intact or if it is going skin flap did great and breakdown we will know more as it goes over the next couple of weeks. Nonetheless right now this actually does not look too bad definitely no signs of infection this is on the right lower leg. Otherwise the patient seems to be doing extremely well with regard to the wound VAC I think that this is doing an awesome job I think putting some collagen down to the tunnel would be beneficial I think the black foam is still better than the white foam to be honest. 12-12-2022 upon evaluation today patient appears to be doing pretty well currently in regard to her wound. She has been tolerating the wound VAC without complication and seems to be making good progress here. Fortunately I do not see any signs of infection locally or systemically which is great news and in general I do believe that we are making excellent headway towards closure which is great news as well. No fevers, chills, nausea, vomiting, or diarrhea. 12-26-2022 upon evaluation today patient's wound does appear to be doing much better. There does not appear to be any signs of active infection which is great news and in general I think that her making excellent headway towards closure which is great news. No fevers, chills, nausea, vomiting, or diarrhea. 01-16-2023 upon evaluation today patient appears to be doing well currently in regard to her wounds the leg is healed and the gluteal region is  significantly improved. I really do not think there is much that a wound VAC can continue to help with at this point. I  discussed that with the patient today. Objective Constitutional Obese and well-hydrated in no acute distress. Vitals Time Taken: 3:29 PM, Height: 66 in, Weight: 191 lbs, BMI: 30.8, Temperature: 98.1 F, Pulse: 72 bpm, Respiratory Rate: 18 breaths/min, Blood Pressure: 121/50 mmHg. Respiratory normal breathing without difficulty. Psychiatric this patient is able to make decisions and demonstrates good insight into disease process. Alert and Oriented x 3. pleasant and cooperative. General Notes: Upon inspection patient's wound bed actually showed signs of good granulation epithelization at this point. Fortunately I do not see any signs of AMANOA, CAUFIELD ([id number]) [id number] Page 6 of 7 active infection at this time which is great news and in general I do believe that we will make an excellent headway towards closure I think that the wound in general is doing better she does have some undermining in 1 spot and we can use some Hydrofera Blue rope there and then put Hydrofera Blue in general over top of the wound this change 3 times per week. I think that is can be a good way to go here.. Integumentary (Hair, Skin) Wound #1 status is Open. Original cause of wound was Gradually Appeared. The date acquired was: 09/08/2022. The wound has been in treatment 9 weeks. The wound is located on the Right,Medial Gluteus. The wound measures 2.2cm length x 3.1cm width x 0.2cm depth; 5.356cm^2 area and 1.071cm^3 volume. There is Fat Layer (Subcutaneous Tissue) exposed. There is tunneling at 8:00 with a maximum distance of 2.7cm. There is a medium amount of serosanguineous drainage noted. There is large (67-100%) red granulation within the wound bed. There is no necrotic tissue within the wound bed. Wound #6 status is Healed - Epithelialized. Original cause of wound was Skin Tear/Laceration. The date acquired was: 11/15/2022. The wound has been in treatment 7 weeks. The wound is  located on the Right Lower Leg. The wound measures 0cm length x 0cm width x 0cm depth; 0cm^2 area and 0cm^3 volume. There is a medium amount of serosanguineous drainage noted. There is large (67-100%) red granulation within the wound bed. There is a small (1-33%) amount of necrotic tissue within the wound bed. 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 I would recommend that we have the patient going continue to monitor for any signs of infection or worsening. Based on what I am seeing I do believe that her making really good headway towards closure which is great news. 2. I am going to recommend that the patient should continue to monitor for any signs of infection or worsening if anything changes she knows to contact the office and let me know. 3. Recommend using Hydrofera Blue rope for the 1 area that is deeper and then for the remaining spots we can be using Hydrofera Blue just in the pad form to cover over and then a bordered foam dressing to secure. This should be changed 3 times per week. 4. She should continue with appropriate offloading I think she is doing great in that regard I do  think continuing that plan is the way to go. We will see patient back for reevaluation in 1 week here in the clinic. If anything worsens or changes patient will contact our office for additional recommendations. Electronic Signature(s) Signed: 01/16/2023 3:59:25 PM By: Allen Derry PA-C Entered By: Allen Derry on 01/16/2023  15:59:25 -------------------------------------------------------------------------------- SuperBill Details Patient Name: Date of Service: [name] [name] [name] 01/16/2023 Medical Record Number: [id number] Patient Account Number: [id number] Date of Birth/Sex: Treating RN: 1932-04-05 (89 y.o. 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 E11.622 Type 2 diabetes mellitus with other skin ulcer I10 Essential (primary) hypertension I50.42 Chronic combined systolic (congestive) and diastolic (congestive) heart failure N18.30 Chronic kidney disease, stage 3 unspecified S81.811A Laceration without foreign body, right lower leg, initial encounter Facility Procedures : CPT4 Code: [id number] Description: 99213 - WOUND CARE VISIT-LEV 3 EST PT Modifier: Quantity: 1 Physician Procedures : CPT4 Code Description Modifier [id number] - WC PHYS LEVEL 3 - EST PT ICD-10 Diagnosis Description T81.31XA Disruption of external operation (surgical) wound, not elsewhere classified, initial encounter L02.31 Cutaneous abscess of buttock L98.415  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

## 2023-01-19 NOTE — Progress Notes (Signed)
[name], [name] ([id number]) [id number] Page 1 of 10 Visit Report for 01/16/2023 Arrival Information Details Patient Name: Date of Service: [name] [name], [name] [name] 01/16/2023 3:30 PM Medical Record Number: [id number] Patient Account Number: [id number] Date of Birth/Sex: Treating [name]: [name] (89 y.o. [name] [name] Primary Care [name] [name]: [name] Other Clinician: Referring [name] [name]: Treating [name] [name]/Extender: [name] [name] in Treatment: 9 Visit Information History Since Last Visit Added or deleted any medications: No Patient Arrived: Wheel Chair Any new allergies or adverse reactions: No Arrival Time: 15:28 Has Dressing in Place as Prescribed: Yes Accompanied By: daughter in law Pain Present Now: No Transfer Assistance: EasyPivot Patient Lift Patient Identification Verified: Yes Secondary Verification Process Completed: Yes Patient Requires Transmission-Based No Precautions: Patient Has Alerts: Yes Patient Alerts: Patient on Blood Thinner type 2 diabetic Stroke 2021, R sided weak Electronic Signature(s) Signed: 01/19/2023 12:46:12 PM By: Midge Aver MSN [name] CNS WTA Entered By: Midge Aver on 01/16/2023 15:29:12 -------------------------------------------------------------------------------- Clinic Level of Care Assessment Details Patient Name: Date of Service: [name] [name], [name] [name] 01/16/2023 3:30 PM Medical Record Number: [id number] Patient Account Number: [id number] Date of Birth/Sex: Treating [name]: [name] [name] (89 y.o. [name] [name] Primary Care Kryslyn Helbig: [name] Other Clinician: Referring Rudi Knippenberg: Treating Kingstin Heims/Extender: [name] [name] in Treatment: 9 Clinic Level of Care Assessment Items TOOL 4 Quantity Score X- 1 0 Use when only an EandM is performed on FOLLOW-UP visit ASSESSMENTS - Nursing Assessment / Reassessment X- 1 10 Reassessment of Co-morbidities (includes updates in patient  status) X- 1 5 Reassessment of Adherence to Treatment Plan [name] [name], [name] [name] ([id number]) [id number] Page 2 of 10 ASSESSMENTS - Wound and Skin A ssessment / Reassessment X - Simple Wound Assessment / Reassessment - one wound 1 5 []  - 0 Complex Wound Assessment / Reassessment - multiple wounds []  - 0 Dermatologic / Skin Assessment (not related to wound area) ASSESSMENTS - Focused Assessment []  - 0 Circumferential Edema Measurements - multi extremities []  - 0 Nutritional Assessment / Counseling / Intervention []  - 0 Lower Extremity Assessment (monofilament, tuning fork, pulses) []  - 0 Peripheral Arterial Disease Assessment (using hand held doppler) ASSESSMENTS - Ostomy and/or Continence Assessment and Care []  - 0 Incontinence Assessment and Management []  - 0 Ostomy Care Assessment and Management (repouching, etc.) PROCESS - Coordination of Care X - Simple Patient / Family Education for ongoing care 1 15 []  - 0 Complex (extensive) Patient / Family Education for ongoing care X- 1 10 Staff obtains Chiropractor, Records, T Results / Process Orders est []  - 0 Staff telephones HHA, Nursing Homes / Clarify orders / etc []  - 0 Routine Transfer to another Facility (non-emergent condition) []  - 0 Routine Hospital Admission (non-emergent condition) []  - 0 New Admissions / Manufacturing engineer / Ordering NPWT Apligraf, etc. , []  - 0 Emergency Hospital Admission (emergent condition) X- 1 10 Simple Discharge Coordination []  - 0 Complex (extensive) Discharge Coordination PROCESS - Special Needs []  - 0 Pediatric / Minor Patient Management []  - 0 Isolation Patient Management []  - 0 Hearing / Language / Visual special needs []  - 0 Assessment of Community assistance (transportation, D/C planning, etc.) []  - 0 Additional assistance / Altered mentation []  - 0 Support Surface(s) Assessment (bed, cushion, seat, etc.) INTERVENTIONS - Wound Cleansing /  Measurement X - Simple Wound Cleansing - one wound 1 5 []  - 0 Complex Wound Cleansing - multiple wounds X- 1 5 Wound Imaging (photographs - any number of wounds) []  - 0 Wound Tracing (  instead of photographs) X- 1 5 Simple Wound Measurement - one wound []  - 0 Complex Wound Measurement - multiple wounds INTERVENTIONS - Wound Dressings []  - 0 Small Wound Dressing one or multiple wounds X- 1 15 Medium Wound Dressing one or multiple wounds []  - 0 Large Wound Dressing one or multiple wounds []  - 0 Application of Medications - topical []  - 0 Application of Medications - injection INTERVENTIONS - Miscellaneous []  - 0 External ear exam [name] [name], [name] [name] ([id number]) [phone] Page 3 of 10 []  - 0 Specimen Collection (cultures, biopsies, blood, body fluids, etc.) []  - 0 Specimen(s) / Culture(s) sent or taken to Lab for analysis []  - 0 Patient Transfer (multiple staff / Michiel Sites Lift / Similar devices) []  - 0 Simple Staple / Suture removal (25 or less) []  - 0 Complex Staple / Suture removal (26 or more) []  - 0 Hypo / Hyperglycemic Management (close monitor of Blood Glucose) []  - 0 Ankle / Brachial Index (ABI) - do not check if billed separately X- 1 5 Vital Signs Has the patient been seen at the hospital within the last three years: Yes Total Score: 90 Level Of Care: New/Established - Level 3 Electronic Signature(s) Signed: 01/19/2023 12:46:12 PM By: Midge Aver MSN [name] CNS WTA Entered By: Midge Aver on 01/16/2023 16:18:30 -------------------------------------------------------------------------------- Encounter Discharge Information Details Patient Name: Date of Service: [name] Montane. 01/16/2023 3:30 PM Medical Record Number: [id number] Patient Account Number: [id number] Date of Birth/Sex: Treating [name]: [name] (89 y.o. [name] [name] Primary Care Lorenza Winkleman: [name] Other Clinician: Referring Shaquel Chavous: Treating Tavian Callander/Extender:  [name] [name] in Treatment: 9 Encounter Discharge Information Items Discharge Condition: Stable Ambulatory Status: Wheelchair Discharge Destination: Home Transportation: Private Auto Accompanied By: daughter in law Schedule Follow-up Appointment: Yes Clinical Summary of Care: Electronic Signature(s) Signed: 01/16/2023 4:26:36 PM By: Midge Aver MSN [name] CNS WTA Previous Signature: 01/16/2023 4:20:44 PM Version By: Midge Aver MSN [name] CNS WTA Entered By: Midge Aver on 01/16/2023 16:26:36 Lower Extremity Assessment Details -------------------------------------------------------------------------------- Mikael Spray ([id number]) [id number] Page 4 of 10 Patient Name: Date of Service: [name] [name], [name] [name] 01/16/2023 3:30 PM Medical Record Number: [id number] Patient Account Number: [id number] Date of Birth/Sex: Treating [name]: [name] (89 y.o. [name] [name] Primary Care Aryanna Shaver: [name] Other Clinician: Referring Rainbow Salman: Treating Misty Rago/Extender: [name] [name] in Treatment: 9 Electronic Signature(s) Signed: 01/19/2023 12:46:12 PM By: Midge Aver MSN [name] CNS WTA Entered By: Midge Aver on 01/16/2023 15:42:11 -------------------------------------------------------------------------------- Multi Wound Chart Details Patient Name: Date of Service: [name] Montane. 01/16/2023 3:30 PM Medical Record Number: [id number] Patient Account Number: [id number] Date of Birth/Sex: Treating [name]: [name] (89 y.o. [name] [name] Primary Care Kagan Mutchler: [name] Other Clinician: Referring Angella Montas: Treating Kamrin Sibley/Extender: [name] [name] in Treatment: 9 Vital Signs Height(in): 66 Pulse(bpm): 72 Weight(lbs): 191 Blood Pressure(mmHg): 121/50 Body Mass Index(BMI): 30.8 Temperature(F): 98.1 Respiratory Rate(breaths/min): 18 [1:Photos:] [N/A:N/A] Right, Medial Gluteus Right Lower Leg  N/A Wound Location: Gradually Appeared Skin Tear/Laceration N/A Wounding Event: Abscess Trauma, Other N/A Primary Etiology: Cataracts, Anemia, Asthma, Cataracts, Anemia, Asthma, N/A Comorbid History: Congestive Heart Failure, Congestive Heart Failure, Hypertension, Type II Diabetes, Hypertension, Type II Diabetes, Osteoarthritis Osteoarthritis 09/08/2022 11/15/2022 N/A Date Acquired: 9 7 N/A Weeks of Treatment: Open Healed - Epithelialized N/A Wound Status: No No N/A Wound Recurrence: 2.2x3.1x0.2 0x0x0 N/A Measurements L x W x D (cm) 5.356 0 N/A A (cm) : rea 1.071 0 N/A Volume (cm) : 81.40% 99.40% N/A %  Reduction in A rea: 98.40% 99.30% N/A % Reduction in Volume: 8 Position 1 (o'clock): 2.7 Maximum Distance 1 (cm): Yes N/A N/A Tunneling: Full Thickness Without Exposed Full Thickness Without Exposed N/A Classification: Support Structures Support Structures Medium Medium N/A Exudate Amount: Serosanguineous Serosanguineous N/A Exudate Type: red, brown red, brown N/A Exudate Color: Large (67-100%) Large (67-100%) N/A Granulation Amount: KAZI, MALIA ([id number]) [phone].pdf Page 5 of 10 Red Red N/A Granulation Quality: None Present (0%) Small (1-33%) N/A Necrotic A mount: Fat Layer (Subcutaneous Tissue): Yes Fat Layer (Subcutaneous Tissue): No N/A Exposed Structures: None Medium (34-66%) N/A Epithelialization: Treatment Notes Electronic Signature(s) Signed: 01/19/2023 12:46:12 PM By: Midge Aver MSN [name] CNS WTA Entered By: Midge Aver on 01/16/2023 15:48:18 -------------------------------------------------------------------------------- Multi-Disciplinary Care Plan Details Patient Name: Date of Service: [name] Russell [name] 01/16/2023 3:30 PM Medical Record Number: [id number] Patient Account Number: [id number] Date of Birth/Sex: Treating [name]: [name] (89 y.o. [name] [name] Primary Care Juliza Machnik: [name] Other  Clinician: Referring Dedric Ethington: Treating Folashade Gamboa/Extender: [name] [name] in Treatment: 9 Active Inactive Pressure Nursing Diagnoses: Knowledge deficit related to causes and risk factors for pressure ulcer development Potential for impaired tissue integrity related to pressure, friction, moisture, and shear Goals: Patient will remain free from development of additional pressure ulcers Date Initiated: 11/10/2022 Target Resolution Date: 02/04/2023 Goal Status: Active Patient will remain free of pressure ulcers Date Initiated: 11/10/2022 Target Resolution Date: 02/04/2023 Goal Status: Active Patient/caregiver will verbalize risk factors for pressure ulcer development Date Initiated: 11/10/2022 Date Inactivated: 11/10/2022 Target Resolution Date: 11/10/2022 Goal Status: Met Patient/caregiver will verbalize understanding of pressure ulcer management Date Initiated: 11/10/2022 Date Inactivated: 11/10/2022 Target Resolution Date: 11/10/2022 Goal Status: Met Interventions: Assess: immobility, friction, shearing, incontinence upon admission and as needed Assess offloading mechanisms upon admission and as needed Assess potential for pressure ulcer upon admission and as needed Provide education on pressure ulcers Notes: Wound/Skin Impairment Nursing Diagnoses: Impaired tissue integrity Knowledge deficit related to ulceration/compromised skin integrity Goals: Ulcer/skin breakdown will have a volume reduction of 30% by week 4 Date Initiated: 11/10/2022 Date Inactivated: 12/12/2022 Target Resolution Date: 12/08/2022 TYQUASHA, SUDAR ([id number]) [phone].pdf Page 6 of 10 Goal Status: Met Ulcer/skin breakdown will have a volume reduction of 50% by week 8 Date Initiated: 11/10/2022 Date Inactivated: 01/16/2023 Target Resolution Date: 01/05/2023 Goal Status: Met Ulcer/skin breakdown will have a volume reduction of 80% by week 12 Date Initiated:  11/10/2022 Target Resolution Date: 02/02/2023 Goal Status: Active Ulcer/skin breakdown will heal within 14 weeks Date Initiated: 11/10/2022 Target Resolution Date: 02/16/2023 Goal Status: Active Interventions: Assess patient/caregiver ability to obtain necessary supplies Assess patient/caregiver ability to perform ulcer/skin care regimen upon admission and as needed Assess ulceration(s) every visit Provide education on ulcer and skin care Notes: Electronic Signature(s) Signed: 01/16/2023 4:19:37 PM By: Midge Aver MSN [name] CNS WTA Entered By: Midge Aver on 01/16/2023 16:19:36 -------------------------------------------------------------------------------- Pain Assessment Details Patient Name: Date of Service: [name] Montane. 01/16/2023 3:30 PM Medical Record Number: [id number] Patient Account Number: [id number] Date of Birth/Sex: Treating [name]: [name] (89 y.o. [name] [name] Primary Care Rowena Moilanen: [name] Other Clinician: Referring Kobe Ofallon: Treating Lania Zawistowski/Extender: [name] [name] in Treatment: 9 Active Problems Location of Pain Severity and Description of Pain Patient Has Paino No Site Locations Pain Management and Medication Current Pain Management: Electronic Signature(s) [name] [name], [name] [name] ([id number]) [id number] Page 7 of 10 Signed: 01/19/2023 12:46:12 PM By: Midge Aver MSN [name] CNS WTA Entered By: Midge Aver on 01/16/2023 15:29:41 --------------------------------------------------------------------------------  Patient/Caregiver Education Details Patient Name: Date of Service: [name] [name], [name] [name] 11/19/2024andnbsp3:30 PM Medical Record Number: [id number] Patient Account Number: [id number] Date of Birth/Gender: Treating [name]: [birth year] [name] (89 y.o. [name] [name] Primary Care Physician: [name] Other Clinician: Referring Physician: Treating Physician/Extender: [name] [name] in Treatment:  9 Education Assessment Education Provided To: Patient Education Topics Provided Wound/Skin Impairment: Handouts: Caring for Your Ulcer Methods: Explain/Verbal Responses: State content correctly Electronic Signature(s) Signed: 01/19/2023 12:46:12 PM By: Midge Aver MSN [name] CNS WTA Entered By: Midge Aver on 01/16/2023 16:19:46 -------------------------------------------------------------------------------- Wound Assessment Details Patient Name: Date of Service: [name] Montane. 01/16/2023 3:30 PM Medical Record Number: [id number] Patient Account Number: [id number] Date of Birth/Sex: Treating [name]: [name] (89 y.o. [name] [name] Primary Care Nyeemah Jennette: [name] Other Clinician: Referring Caroll Cunnington: Treating Roddy Bellamy/Extender: [name] [name] in Treatment: 9 Wound Status Wound Number: 1 Primary Abscess Etiology: Wound Location: Right, Medial Gluteus Wound Open Wounding Event: Gradually Appeared Status: Date Acquired: 09/08/2022 Comorbid Cataracts, Anemia, Asthma, Congestive Heart Failure, Weeks Of Treatment: 9 History: Hypertension, Type II Diabetes, Osteoarthritis Clustered Wound: No [name] [name], [name] [name] ([id number]) [id number] Page 8 of 10 Photos Wound Measurements Length: (cm) 2.2 Width: (cm) 3.1 Depth: (cm) 0.2 Area: (cm) 5.356 Volume: (cm) 1.071 % Reduction in Area: 81.4% % Reduction in Volume: 98.4% Epithelialization: None Tunneling: Yes Position (o'clock): 8 Maximum Distance: (cm) 2.7 Wound Description Classification: Full Thickness Without Exposed Suppor Exudate Amount: Medium Exudate Type: Serosanguineous Exudate Color: red, brown t Structures Foul Odor After Cleansing: No Slough/Fibrino Yes Wound Bed Granulation Amount: Large (67-100%) Exposed Structure Granulation Quality: Red Fat Layer (Subcutaneous Tissue) Exposed: Yes Necrotic Amount: None Present (0%) Treatment Notes Wound #1 (Gluteus) Wound  Laterality: Right, Medial Cleanser Peri-Wound Care Topical Primary Dressing Hydrofera Blue Ready Transfer Foam, 2.5x2.5 (in/in) Discharge Instruction: Apply Hydrofera Blue Ready to wound bed as directed Hydrofera rope Secondary Dressing (BORDER) Zetuvit Plus SILICONE BORDER Dressing 4x4 (in/in) Discharge Instruction: Please do not put silicone bordered dressings under wraps. Use non-bordered dressing only. Secured With Compression Wrap Compression Stockings Facilities manager) Signed: 01/19/2023 12:46:12 PM By: Midge Aver MSN [name] CNS WTA Entered By: Midge Aver on 01/16/2023 15:41:58 Mikael Spray ([id number]) [id number] Page 9 of 10 -------------------------------------------------------------------------------- Wound Assessment Details Patient Name: Date of Service: [name] [name], [name] [name] 01/16/2023 3:30 PM Medical Record Number: [id number] Patient Account Number: [id number] Date of Birth/Sex: Treating [name]: [birth year] (89 y.o. [name] [name] Primary Care Sonakshi Rolland: [name] Other Clinician: Referring Apryle Stowell: Treating Lakeem Rozo/Extender: [name] [name] in Treatment: 9 Wound Status Wound Number: 6 Primary Trauma, Other Etiology: Wound Location: Right Lower Leg Wound Healed - Epithelialized Wounding Event: Skin Tear/Laceration Status: Date Acquired: 11/15/2022 Comorbid Cataracts, Anemia, Asthma, Congestive Heart Failure, Weeks Of Treatment: 7 History: Hypertension, Type II Diabetes, Osteoarthritis Clustered Wound: No Photos Wound Measurements Length: (cm) Width: (cm) Depth: (cm) Area: (cm) Volume: (cm) 0 % Reduction in Area: 99.4% 0 % Reduction in Volume: 99.3% 0 Epithelialization: Medium (34-66%) 0 0 Wound Description Classification: Full Thickness Without Exposed Suppor Exudate Amount: Medium Exudate Type: Serosanguineous Exudate Color: red, brown t Structures Foul Odor After Cleansing:  No Slough/Fibrino Yes Wound Bed Granulation Amount: Large (67-100%) Exposed Structure Granulation Quality: Red Fat Layer (Subcutaneous Tissue) Exposed: No Necrotic Amount: Small (1-33%) Treatment Notes Wound #6 (Lower Leg) Wound Laterality: Right Cleanser Peri-Wound Care Topical Primary Dressing Secondary Dressing [name] [name], [name] [name] ([id number]) [id number] Page 10 of 10 Secured With Compression Wrap Compression Stockings Add-Ons Electronic  Signature(s) Signed: 01/19/2023 12:46:12 PM By: Midge Aver MSN [name] CNS WTA Entered By: Midge Aver on 01/16/2023 15:47:35 -------------------------------------------------------------------------------- Vitals Details Patient Name: Date of Service: [name] Russell [name] 01/16/2023 3:30 PM Medical Record Number: [id number] Patient Account Number: [id number] Date of Birth/Sex: Treating [name]: [name] (89 y.o. [name] [name] Primary Care Pricsilla Lindvall: [name] Other Clinician: Referring Hydee Fleece: Treating Asees Manfredi/Extender: [name] [name] in Treatment: 9 Vital Signs Time Taken: 15:29 Temperature (F): 98.1 Height (in): 66 Pulse (bpm): 72 Weight (lbs): 191 Respiratory Rate (breaths/min): 18 Body Mass Index (BMI): 30.8 Blood Pressure (mmHg): 121/50 Reference Range: 80 - 120 mg / dl Electronic Signature(s) Signed: 01/19/2023 12:46:12 PM By: Midge Aver MSN [name] CNS WTA Entered By: Midge Aver on 01/16/2023 15:29:32

## 2023-02-06 ENCOUNTER — Encounter: Payer: Medicare Other | Attending: Physician Assistant | Admitting: Physician Assistant

## 2023-02-06 DIAGNOSIS — I5042 Chronic combined systolic (congestive) and diastolic (congestive) heart failure: Secondary | ICD-10-CM | POA: Diagnosis not present

## 2023-02-06 DIAGNOSIS — L98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: Secondary | ICD-10-CM | POA: Diagnosis not present

## 2023-02-06 DIAGNOSIS — X58XXXA Exposure to other specified factors, initial encounter: Secondary | ICD-10-CM | POA: Diagnosis not present

## 2023-02-06 DIAGNOSIS — N183 Chronic kidney disease, stage 3 unspecified: Secondary | ICD-10-CM | POA: Diagnosis not present

## 2023-02-06 DIAGNOSIS — E11622 Type 2 diabetes mellitus with other skin ulcer: Secondary | ICD-10-CM | POA: Insufficient documentation

## 2023-02-06 DIAGNOSIS — E1122 Type 2 diabetes mellitus with diabetic chronic kidney disease: Secondary | ICD-10-CM | POA: Diagnosis not present

## 2023-02-06 DIAGNOSIS — I130 Hypertensive heart and chronic kidney disease with heart failure and stage 1 through stage 4 chronic kidney disease, or unspecified chronic kidney disease: Secondary | ICD-10-CM | POA: Diagnosis not present

## 2023-02-06 DIAGNOSIS — T8131XA Disruption of external operation (surgical) wound, not elsewhere classified, initial encounter: Secondary | ICD-10-CM | POA: Insufficient documentation

## 2023-02-06 DIAGNOSIS — S81811A Laceration without foreign body, right lower leg, initial encounter: Secondary | ICD-10-CM | POA: Diagnosis not present

## 2023-02-06 DIAGNOSIS — L0231 Cutaneous abscess of buttock: Secondary | ICD-10-CM | POA: Insufficient documentation

## 2023-02-08 NOTE — Progress Notes (Signed)
[name] [name] ([id number]) [id number] Page 1 of 8 Visit Report for 02/06/2023 Chief Complaint Document Details Patient Name: Date of Service: [name], [name] 02/06/2023 3:00 PM Medical Record Number: [id number] Patient Account Number: [id number] Date of Birth/Sex: Treating RN: [birth year] (89 y.o. [name] [name] Primary Care Provider: [name] Other Clinician: Referring Provider: Treating Provider/Extender: Lavonia Drafts in Treatment: 12 Information Obtained from: Patient Chief Complaint Bilateral Gluteal Ulcers and right leg skin tear Electronic Signature(s) Signed: 02/06/2023 5:28:06 PM By: Allen Derry PA-C Entered By: Allen Derry on 02/06/2023 17:28:06 -------------------------------------------------------------------------------- Debridement Details Patient Name: Date of Service: [name] [name] [name] 02/06/2023 3:00 PM Medical Record Number: [id number] Patient Account Number: [id number] Date of Birth/Sex: Treating RN: 18-Aug-1932 (89 y.o. [name] [name] Primary Care Provider: [name] Other Clinician: Referring Provider: Treating Provider/Extender: Lavonia Drafts in Treatment: 12 Debridement Performed for Assessment: Wound #1 Right,Medial Gluteus Performed By: Physician Allen Derry, PA-C The following information was scribed by: Angelina Pih The information was scribed for: Allen Derry Debridement Type: Debridement Level of Consciousness (Pre-procedure): Awake and Alert Pre-procedure Verification/Time Out Yes - 15:52 Taken: Pain Control: Lidocaine 4% T opical Solution Percent of Wound Bed Debrided: 100% T Area Debrided (cm): otal 4.91 Tissue and other material debrided: Viable, Non-Viable, Slough, Subcutaneous, Slough, Hyper-granulation Level: Skin/Subcutaneous Tissue Debridement Description: Excisional Instrument: Curette Bleeding: Moderate Hemostasis Achieved: Silver  Nitrate Response to Treatment: Procedure was tolerated well Level of Consciousness Arlie SolomonsCARMELLA, [name] ([id number]) [id number] Page 2 of 8 Level of Consciousness (Post- Awake and Alert procedure): Post Debridement Measurements of Total Wound Length: (cm) 2.5 Width: (cm) 2.5 Depth: (cm) 0.2 Volume: (cm) 0.982 Character of Wound/Ulcer Post Debridement: Stable Post Procedure Diagnosis Same as Pre-procedure Notes 2 sticks used Electronic Signature(s) Signed: 02/06/2023 4:48:05 PM By: Angelina Pih Signed: 02/07/2023 7:54:12 PM By: Allen Derry PA-C Entered By: Angelina Pih on 02/06/2023 16:00:49 -------------------------------------------------------------------------------- HPI Details Patient Name: Date of Service: [name] [name] [name] 02/06/2023 3:00 PM Medical Record Number: [id number] Patient Account Number: [id number] Date of Birth/Sex: Treating RN: 1932-08-19 (89 y.o. [name] [name] Primary Care Provider: [name] Other Clinician: Referring Provider: Treating Provider/Extender: Lavonia Drafts in Treatment: 12 History of Present Illness HPI Description: 11-10-2022 upon evaluation today patient presents for initial inspection here in our clinic concerning a wound which occurred unfortunately as a result of an abscess. This was the main right gluteal/sacral region. With that being said she noted this first around or just before July 12 when she was admitted to the hospital from the 12th through the 26. The wound was at that point drained and subsequently she thought that she was headed in the right direction. With that being said she ended up having to be admitted to the hospital August and this was due to a CHF exacerbation with a took 25 pounds of fluid off of her. Subsequently she was then rehab for the wound at [facility] burn and the physician there apparently did an awesome job based on what I am seeing the wound  actually looks like is doing quite well at this point. They were doing a fairly complex VAC with the small openings in multiple areas that were being backed as well at this point the good news is I do not think that he would need to be necessary based on what I am seeing. She does have home health coming out to do the wound VAC changes Monday, Wednesday, and Friday although get  this scheduled going has been somewhat a concern due to the fact that the patient is been in and out of the hospital. Patient does have a history of having had a stroke in 2001 with some left-sided weakness. She also on September 08, 2022 had her A1c checked this was 9.8. She is a diabetic. Other than these 2 conditions she also has chronic kidney disease stage III, hypertension, and again obviously the wounds which were can be helping to manage at this point. 11-28-2022 upon evaluation today patient appears to be doing well currently in regard to her wound in the sacral area. Most of the surrounding wounds are actually closed and they look excellent. I am actually very pleased with where we stand. The only downside is she does have a skin tear which is still questionable whether it is going to resolved with a skin flap intact or if it is going skin flap did great and breakdown we will know more as it goes over the next couple of weeks. Nonetheless right now this actually does not look too bad definitely no signs of infection this is on the right lower leg. Otherwise the patient seems to be doing extremely well with regard to the wound VAC I think that this is doing an awesome job I think putting some collagen down to the tunnel would be beneficial I think the black foam is still better than the white foam to be honest. 12-12-2022 upon evaluation today patient appears to be doing pretty well currently in regard to her wound. She has been tolerating the wound VAC without complication and seems to be making good progress here.  Fortunately I do not see any signs of infection locally or systemically which is great news and in general I do believe that we are making excellent headway towards closure which is great news as well. No fevers, chills, nausea, vomiting, or diarrhea. 12-26-2022 upon evaluation today patient's wound does appear to be doing much better. There does not appear to be any signs of active infection which is great news and in general I think that her making excellent headway towards closure which is great news. No fevers, chills, nausea, vomiting, or diarrhea. 01-16-2023 upon evaluation today patient appears to be doing well currently in regard to her wounds the leg is healed and the gluteal region is significantly improved. I really do not think there is much that a wound VAC can continue to help with at this point. I discussed that with the patient today. 02-06-2023 upon evaluation today patient appears to be doing well currently in regard to her wound. She has been tolerating the dressing changes without complication. With that being said the wound does show some hypergranulation we will continue to try to work on this to some degree here. With that being said I do believe the [facility] is a good option here but at the same time I think she still has some signs that she may have infection at this point as well. [name], [name] ([id number]) [id number] Page 3 of 8 will try to see what we can do about improving that overall. Electronic Signature(s) Signed: 02/07/2023 6:50:24 PM By: Allen Derry PA-C Entered By: Allen Derry on 02/07/2023 18:50:24 -------------------------------------------------------------------------------- Physical Exam Details Patient Name: Date of Service: [name], [name] 02/06/2023 3:00 PM Medical Record Number: [id number] Patient Account Number: [id number] Date of Birth/Sex: Treating RN: 1932/09/12 (89 y.o. [name] [name] Primary Care  Provider: [name] Other Clinician: Referring Provider:  Treating Provider/Extender: Lavonia Drafts in Treatment: 12 Constitutional Well-nourished and well-hydrated in no acute distress. Respiratory normal breathing without difficulty. Psychiatric this patient is able to make decisions and demonstrates good insight into disease process. Alert and Oriented x 3. pleasant and cooperative. Notes Patient's wound did require sharp debridement clear away necrotic debris she tolerated this today without complication and postdebridement wound bed appears to be doing much better which is great news. Electronic Signature(s) Signed: 02/07/2023 6:50:38 PM By: Allen Derry PA-C Entered By: Allen Derry on 02/07/2023 18:50:37 -------------------------------------------------------------------------------- Physician Orders Details Patient Name: Date of Service: [name], MARINA [name] 02/06/2023 3:00 PM Medical Record Number: [id number] Patient Account Number: [id number] Date of Birth/Sex: Treating RN: 09-16-1932 (89 y.o. [name] [name] Primary Care Provider: [name] Other Clinician: Referring Provider: Treating Provider/Extender: Lavonia Drafts in Treatment: 12 The following information was scribed by: Angelina Pih The information was scribed for: Allen Derry Verbal / Phone Orders: No Diagnosis Coding JESALYN, ANTCZAK ([id number]) [id number] Page 4 of 8 Follow-up Appointments Return Appointment in 1 week. Home Health Home Health Company: - [facility] for wound care. May utilize formulary equivalent dressing for wound treatment orders unless otherwise specified. Home Health Nurse may visit PRN to address patients wound care needs. Scheduled days for dressing changes to be completed; exception, patient has scheduled wound care visit that day. **Please direct any NON-WOUND related issues/requests for  orders to patient's Primary Care Physician. **If current dressing causes regression in wound condition, may D/C ordered dressing product/s and apply Normal Saline Moist Dressing daily until next Wound Healing Center or Other MD appointment. **Notify Wound Healing Center of regression in wound condition at [phone]. Bathing/ Shower/ Hygiene No tub bath. Anesthetic (Use 'Patient Medications' Section for Anesthetic Order Entry) Lidocaine applied to wound bed Off-Loading Turn and reposition every 2 hours Other: - stay off bottom as much as possible Negative Pressure Wound Therapy Discontinue NPWT. Wound Treatment Wound #1 - Gluteus Wound Laterality: Right, Medial Cleanser: Vashe 5.8 (oz) 1 x Per Day/15 Days Discharge Instructions: Use vashe 5.8 (oz) as directed Prim Dressing: Gauze 1 x Per Day/15 Days ary Discharge Instructions: As directed: dry create a bulster on top of the HB and then place BF Prim Dressing: Hydrofera Blue Ready Transfer Foam, 2.5x2.5 (in/in) 1 x Per Day/15 Days ary Discharge Instructions: Apply Hydrofera Blue Ready to wound bed as directed Secondary Dressing: (BORDER) Zetuvit Plus SILICONE BORDER Dressing 4x4 (in/in) 1 x Per Day/15 Days Discharge Instructions: Please do not put silicone bordered dressings under wraps. Use non-bordered dressing only. Patient Medications llergies: amlodipine, ACE Inhibitors, atorvastatin, canagliflozin, clonidine, buprenorphine, lisinopril, morphine, codeine A Notifications Medication Indication Start End 02/06/2023 Bactrim DS DOSE 1 - oral 800 mg-160 mg tablet - 1 tablet oral twice a day x 14 days Electronic Signature(s) Signed: 02/06/2023 5:29:18 PM By: Allen Derry PA-C Previous Signature: 02/06/2023 4:48:05 PM Version By: Angelina Pih Entered By: Allen Derry on 02/06/2023 17:29:17 -------------------------------------------------------------------------------- Problem List Details Patient Name: Date of Service: [name] [name]  [name] 02/06/2023 3:00 PM Medical Record Number: [id number] Patient Account Number: [id number] Date of Birth/Sex: Treating RN: 09-May-1932 (89 y.o. 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 without 11/10/2022 No Yes evidence of necrosis E11.622 Type 2 diabetes mellitus with other skin ulcer 11/10/2022 No Yes I10 Essential (primary) hypertension 11/10/2022 No Yes I50.42 Chronic combined systolic (congestive) and diastolic (congestive) heart failure 11/10/2022 No Yes N18.30 Chronic kidney disease, stage 3 unspecified 11/10/2022 No Yes S81.811A Laceration without foreign body, right lower leg, initial encounter 11/28/2022 No Yes Inactive Problems Resolved Problems Electronic Signature(s) Signed: 02/06/2023 5:28:03 PM By: Allen Derry PA-C Entered By: Allen Derry on 02/06/2023 17:28:03 -------------------------------------------------------------------------------- Progress Note Details Patient Name: Date of Service: [name] [name] [name] 02/06/2023 3:00 PM Medical Record Number: [id number] Patient Account Number: [id number] Date of Birth/Sex: Treating RN: Mar 20, 1932 (89 y.o. [name] [name] Primary Care Provider: [name] Other Clinician: Referring Provider: Treating Provider/Extender: Lavonia Drafts in Treatment: [address] Subjective Chief Complaint [name], [name] ([id number]) [id number] Page 6 of 8 Information  obtained from Patient Bilateral Gluteal Ulcers and right leg skin tear History of Present Illness (HPI) 11-10-2022 upon evaluation today patient presents for initial inspection here in our clinic concerning a wound which occurred unfortunately as a result of an abscess. This was the main right gluteal/sacral region. With that being said she noted this first around or just before July 12 when she was admitted to the hospital from the 12th through the 26. The wound was at that point drained and subsequently she thought that she was headed in the right direction. With that being said she ended up having to be admitted to the hospital August and this was due to a CHF exacerbation with a took 25 pounds of fluid off of her. Subsequently she was then rehab for the wound at [facility] burn and the physician there apparently did an awesome job based on what I am seeing the wound actually looks like is doing quite well at this point. They were doing a fairly complex VAC with the small openings in multiple areas that were being backed as well at this point the good news is I do not think that he would need to be necessary based on what I am seeing. She does have home health coming out to do the wound VAC changes Monday, Wednesday, and Friday although get this scheduled going has been somewhat a concern due to the fact that the patient is been in and out of the hospital. Patient does have a history of having had a stroke in 2001 with some left-sided weakness. She also on September 08, 2022 had her A1c checked this was 9.8. She is a diabetic. Other than these 2 conditions she also has chronic kidney disease stage III, hypertension, and again obviously the wounds which were can be helping to manage at this point. 11-28-2022 upon evaluation today patient appears to be doing well currently in regard to her wound in the sacral area. Most of the surrounding wounds are actually closed and they look excellent. I am actually very  pleased with where we stand. The only downside is she does have a skin tear which is still questionable whether it is going to resolved with a skin flap intact or if it is going skin flap did great and breakdown we will know more as it goes over the next couple of weeks. Nonetheless right now this actually does not look too bad definitely no signs of infection this is on the right lower leg. Otherwise the patient seems to be  doing extremely well with regard to the wound VAC I think that this is doing an awesome job I think putting some collagen down to the tunnel would be beneficial I think the black foam is still better than the white foam to be honest. 12-12-2022 upon evaluation today patient appears to be doing pretty well currently in regard to her wound. She has been tolerating the wound VAC without complication and seems to be making good progress here. Fortunately I do not see any signs of infection locally or systemically which is great news and in general I do believe that we are making excellent headway towards closure which is great news as well. No fevers, chills, nausea, vomiting, or diarrhea. 12-26-2022 upon evaluation today patient's wound does appear to be doing much better. There does not appear to be any signs of active infection which is great news and in general I think that her making excellent headway towards closure which is great news. No fevers, chills, nausea, vomiting, or diarrhea. 01-16-2023 upon evaluation today patient appears to be doing well currently in regard to her wounds the leg is healed and the gluteal region is significantly improved. I really do not think there is much that a wound VAC can continue to help with at this point. I discussed that with the patient today. 02-06-2023 upon evaluation today patient appears to be doing well currently in regard to her wound. She has been tolerating the dressing changes without complication. With that being said the wound  does show some hypergranulation we will continue to try to work on this to some degree here. With that being said I do believe the [facility] is a good option here but at the same time I think she still has some signs that she may have infection at this point as well. I will try to see what we can do about improving that overall. Objective Constitutional Well-nourished and well-hydrated in no acute distress. Vitals Time Taken: 3:03 PM, Height: 66 in, Weight: 191 lbs, BMI: 30.8, Temperature: 97.7 F, Pulse: 67 bpm, Respiratory Rate: 18 breaths/min, Blood Pressure: 142/47 mmHg. Respiratory normal breathing without difficulty. Psychiatric this patient is able to make decisions and demonstrates good insight into disease process. Alert and Oriented x 3. pleasant and cooperative. General Notes: Patient's wound did require sharp debridement clear away necrotic debris she tolerated this today without complication and postdebridement wound bed appears to be doing much better which is great news. Integumentary (Hair, Skin) Wound #1 status is Open. Original cause of wound was Gradually Appeared. The date acquired was: 09/08/2022. The wound has been in treatment 12 weeks. The wound is located on the Right,Medial Gluteus. The wound measures 2.5cm length x 2.5cm width x 0.2cm depth; 4.909cm^2 area and 0.982cm^3 volume. There is Fat Layer (Subcutaneous Tissue) exposed. There is tunneling at 8:00 with a maximum distance of 3.1cm. There is a large amount of serosanguineous drainage noted. There is large (67-100%) red granulation within the wound bed. There is no necrotic tissue within the wound bed. 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 There was a Excisional Skin/Subcutaneous Tissue Debridement with a total area of 4.91 sq cm performed by Allen Derry, PA-C. With the following instrument(s): Curette to remove Viable and Non-Viable tissue/material. Material removed includes Subcutaneous Tissue, Slough, and Hyper-granulation after achieving pain control using Lidocaine 4% T opical Solution. No specimens were taken. A time out was conducted at 15:52, prior to the start of the procedure. A Moderate amount of bleeding was controlled with Silver Nitrate. The procedure was tolerated well. Post Debridement Measurements: 2.5cm length x 2.5cm width x 0.2cm depth; 0.982cm^3 volume. Character of Wound/Ulcer Post Debridement is stable. Post procedure Diagnosis Wound #1: Same as Pre-Procedure General Notes: 2 sticks used. Plan Follow-up Appointments: Return Appointment in 1 week. Home Health: Home Health Company: - [facility] for wound care. May utilize formulary equivalent dressing for wound treatment orders unless otherwise specified. Home Health Nurse may visit PRN to address patients wound care needs. Scheduled days for dressing changes to be completed; exception, patient has scheduled wound care visit that day. **Please direct any NON-WOUND related issues/requests for orders to patient's Primary Care Physician. **If current dressing causes regression in wound condition, may D/C ordered dressing product/s and apply Normal Saline Moist Dressing daily until next Wound Healing Center or Other MD appointment. **Notify  Wound Healing Center of regression in wound condition at [phone]. Bathing/ Shower/ Hygiene: No tub bath. Anesthetic (Use 'Patient Medications' Section for Anesthetic Order Entry): Lidocaine applied to wound bed Off-Loading: Turn and reposition every 2 hours Other: - stay off bottom as much as possible Negative Pressure Wound Therapy: Discontinue NPWT. The following medication(s) was prescribed: Bactrim DS oral 800 mg-160 mg tablet 1 1 tablet oral twice a day x 14 days starting 02/06/2023 WOUND #1: - Gluteus Wound Laterality: Right, Medial Cleanser: Vashe 5.8 (oz) 1 x Per Day/15 Days Discharge Instructions: Use vashe 5.8 (oz) as directed Prim Dressing: Gauze 1 x Per Day/15 Days ary Discharge Instructions: As directed: dry create a bulster on top of the HB and then place BF Prim Dressing: Hydrofera Blue Ready Transfer Foam, 2.5x2.5 (in/in) 1 x Per Day/15 Days ary Discharge Instructions: Apply Hydrofera Blue Ready to wound bed as directed Secondary Dressing: (BORDER) Zetuvit Plus SILICONE BORDER Dressing 4x4 (in/in) 1 x Per Day/15 Days Discharge Instructions: Please do not put silicone bordered dressings under wraps. Use non-bordered dressing only. 1. Based on what I am seeing I am going to recommend that we go ahead and place the patient on an antibiotic I would recommend Bactrim as the antibiotic of choice I think this is probably to be the best way to go she is in agreement with that plan. 2. I am going to recommend as well that we going continue with the [facility]. 3. I am going to recommend she should continue with appropriate offloading I did perform silver nitrate after the debridement which the patient tolerated quite well. We will see patient back for reevaluation in 1 week here in the clinic. If anything worsens or changes patient will contact our office for additional recommendations. Electronic Signature(s) Signed: 02/07/2023 6:51:17 PM By: Allen Derry PA-C Entered  By: Allen Derry on 02/07/2023 18:51:17 Mikael Spray ([id number]) [id number] Page 8 of 8 -------------------------------------------------------------------------------- SuperBill Details Patient Name: Date of Service: [name], [name] 02/06/2023 Medical Record Number: [id number] Patient Account Number: [id number] Date of Birth/Sex: Treating RN: 1932-11-25 (89 y.o. 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 E11.622 Type 2 diabetes mellitus with other skin ulcer I10 Essential (primary) hypertension I50.42 Chronic combined systolic (congestive) and diastolic (congestive) heart failure N18.30 Chronic kidney disease, stage 3 unspecified S81.811A Laceration without foreign body, right lower leg, initial encounter Facility Procedures : CPT4 Code: 62130865 Description: 11042 - DEB SUBQ TISSUE 20 SQ CM/< ICD-10 Diagnosis Description 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

## 2023-02-15 ENCOUNTER — Encounter: Payer: Medicare Other | Admitting: Physician Assistant

## 2023-02-15 DIAGNOSIS — T8131XA Disruption of external operation (surgical) wound, not elsewhere classified, initial encounter: Secondary | ICD-10-CM | POA: Diagnosis not present

## 2023-02-16 NOTE — Progress Notes (Signed)
[name] [name], [name] [name] ([id number]) [id number] Page 1 of 9 Visit Report for 02/15/2023 Arrival Information Details Patient Name: Date of Service: [name] [name], [name] [name] 02/15/2023 3:00 PM Medical Record Number: [id number] Patient Account Number: [id number] Date of Birth/Sex: Treating RN: [name] (89 y.o. [name] [name] Primary Care [name] [name]: [name] [name] Other Clinician: Referring [name] [name]: Treating [name] [name]/Extender: [name] [name] in Treatment: 13 Visit Information History Since Last Visit Added or deleted any medications: No Patient Arrived: Wheel Chair Any new allergies or adverse reactions: No Arrival Time: 15:04 Had a fall or experienced change in No Accompanied By: family activities of daily living that may affect Transfer Assistance: None risk of falls: Patient Identification Verified: Yes Hospitalized since last visit: No Secondary Verification Process Completed: Yes Has Dressing in Place as Prescribed: Yes Patient Requires Transmission-Based No Pain Present Now: No Precautions: Patient Has Alerts: Yes Patient Alerts: Patient on Blood Thinner type 2 diabetic Stroke 2021, R sided weak Electronic Signature(s) Signed: 02/15/2023 4:27:20 PM By: [name] [name] Entered By: [name] [name] on 02/15/2023 12:04:23 -------------------------------------------------------------------------------- Clinic Level of Care Assessment Details Patient Name: Date of Service: [name] [name] 02/15/2023 3:00 PM Medical Record Number: [id number] Patient Account Number: [id number] Date of Birth/Sex: Treating RN: [name] [name] (89 y.o. [name] [name] Primary Care [name] [name]: [name] [name] Other Clinician: Referring [name] [name]: Treating [name] [name]/Extender: [name] [name] in Treatment: 13 Clinic Level of Care Assessment Items TOOL 4 Quantity Score []  - 0 Use when only an EandM is performed on FOLLOW-UP visit ASSESSMENTS -  Nursing Assessment / Reassessment X- 1 10 Reassessment of Co-morbidities (includes updates in patient status) X- 1 5 Reassessment of Adherence to Treatment Plan [name] [name], [name] [name] ([id number]) [id number] Page 2 of 9 ASSESSMENTS - Wound and Skin A ssessment / Reassessment X - Simple Wound Assessment / Reassessment - one wound 1 5 []  - 0 Complex Wound Assessment / Reassessment - multiple wounds []  - 0 Dermatologic / Skin Assessment (not related to wound area) ASSESSMENTS - Focused Assessment []  - 0 Circumferential Edema Measurements - multi extremities []  - 0 Nutritional Assessment / Counseling / Intervention []  - 0 Lower Extremity Assessment (monofilament, tuning fork, pulses) []  - 0 Peripheral Arterial Disease Assessment (using hand held doppler) ASSESSMENTS - Ostomy and/or Continence Assessment and Care []  - 0 Incontinence Assessment and Management []  - 0 Ostomy Care Assessment and Management (repouching, etc.) PROCESS - Coordination of Care X - Simple Patient / Family Education for ongoing care 1 15 []  - 0 Complex (extensive) Patient / Family Education for ongoing care X- 1 10 Staff obtains Chiropractor, Records, T Results / Process Orders est []  - 0 Staff telephones HHA, Nursing Homes / Clarify orders / etc []  - 0 Routine Transfer to another Facility (non-emergent condition) []  - 0 Routine Hospital Admission (non-emergent condition) []  - 0 New Admissions / Manufacturing engineer / Ordering NPWT Apligraf, etc. , []  - 0 Emergency Hospital Admission (emergent condition) X- 1 10 Simple Discharge Coordination []  - 0 Complex (extensive) Discharge Coordination PROCESS - Special Needs []  - 0 Pediatric / Minor Patient Management []  - 0 Isolation Patient Management []  - 0 Hearing / Language / Visual special needs []  - 0 Assessment of Community assistance (transportation, D/C planning, etc.) []  - 0 Additional assistance / Altered mentation []   - 0 Support Surface(s) Assessment (bed, cushion, seat, etc.) INTERVENTIONS - Wound Cleansing / Measurement X - Simple Wound Cleansing - one wound 1 5 []  - 0 Complex Wound Cleansing - multiple wounds []  -  0 Wound Imaging (photographs - any number of wounds) []  - 0 Wound Tracing (instead of photographs) X- 1 5 Simple Wound Measurement - one wound []  - 0 Complex Wound Measurement - multiple wounds INTERVENTIONS - Wound Dressings X - Small Wound Dressing one or multiple wounds 1 10 []  - 0 Medium Wound Dressing one or multiple wounds []  - 0 Large Wound Dressing one or multiple wounds []  - 0 Application of Medications - topical []  - 0 Application of Medications - injection INTERVENTIONS - Miscellaneous []  - 0 External ear exam [name] [name], [name] [name] ([id number]) [phone] Page 3 of 9 X- 1 5 Specimen Collection (cultures, biopsies, blood, body fluids, etc.) X- 1 5 Specimen(s) / Culture(s) sent or taken to Lab for analysis []  - 0 Patient Transfer (multiple staff / Michiel Sites Lift / Similar devices) []  - 0 Simple Staple / Suture removal (25 or less) []  - 0 Complex Staple / Suture removal (26 or more) []  - 0 Hypo / Hyperglycemic Management (close monitor of Blood Glucose) []  - 0 Ankle / Brachial Index (ABI) - do not check if billed separately X- 1 5 Vital Signs Has the patient been seen at the hospital within the last three years: Yes Total Score: 90 Level Of Care: New/Established - Level 3 Electronic Signature(s) Signed: 02/15/2023 4:27:20 PM By: [name] [name] Entered By: [name] [name] on 02/15/2023 12:52:26 -------------------------------------------------------------------------------- Encounter Discharge Information Details Patient Name: Date of Service: [name] [name] [name] C. 02/15/2023 3:00 PM Medical Record Number: [id number] Patient Account Number: [id number] Date of Birth/Sex: Treating RN: [name][birth year] (89 y.o. [name] [name] Primary Care  Analeise Mccleery: [name] [name] Other Clinician: Referring [name] [name]: Treating [name] [name]/Extender: [name] [name] in Treatment: 13 Encounter Discharge Information Items Discharge Condition: Stable Ambulatory Status: Wheelchair Discharge Destination: Home Transportation: Private Auto Accompanied By: daughter in law Schedule Follow-up Appointment: Yes Clinical Summary of Care: Electronic Signature(s) Signed: 02/15/2023 4:27:20 PM By: [name] [name] Entered By: [name] [name] on 02/15/2023 12:56:54 -------------------------------------------------------------------------------- Lower Extremity Assessment Details Patient Name: Date of Service: MAYSEN, BLACKWELDER [name] C. 02/15/2023 3:00 PM [name] [name], [name] [name] ([id number]) [id number] Page 4 of 9 Medical Record Number: [id number] Patient Account Number: [id number] Date of Birth/Sex: Treating RN: [birth year] [name] (89 y.o. [name] [name] Primary Care Arley Salamone: [name] [name] Other Clinician: Referring Mosella Kasa: Treating Angy Swearengin/Extender: [name] [name] in Treatment: 13 Electronic Signature(s) Signed: 02/15/2023 4:27:20 PM By: [name] [name] Entered By: [name] [name] on 02/15/2023 12:08:18 -------------------------------------------------------------------------------- Multi Wound Chart Details Patient Name: Date of Service: [name] [name] [name] C. 02/15/2023 3:00 PM Medical Record Number: [id number] Patient Account Number: [id number] Date of Birth/Sex: Treating RN: [name] (89 y.o. [name] [name] Primary Care Kyelle Urbas: [name] [name] Other Clinician: Referring Sreshta Cressler: Treating Amyrah Pinkhasov/Extender: [name] [name] in Treatment: 13 Vital Signs Height(in): 66 Pulse(bpm): 67 Weight(lbs): 191 Blood Pressure(mmHg): 144/71 Body Mass Index(BMI): 30.8 Temperature(F): 98.1 Respiratory Rate(breaths/min): 18 [1:Photos:] [N/A:N/A] Right, Medial Gluteus N/A N/A Wound  Location: Gradually Appeared N/A N/A Wounding Event: Abscess N/A N/A Primary Etiology: Cataracts, Anemia, Asthma, N/A N/A Comorbid History: Congestive Heart Failure, Hypertension, Type II Diabetes, Osteoarthritis 09/08/2022 N/A N/A Date Acquired: 13 N/A N/A Weeks of Treatment: Open N/A N/A Wound Status: No N/A N/A Wound Recurrence: 2.2x2.9x0.1 N/A N/A Measurements L x W x D (cm) 5.011 N/A N/A A (cm) : rea 0.501 N/A N/A Volume (cm) : 82.60% N/A N/A % Reduction in A rea: 99.20% N/A N/A % Reduction in Volume: 1 Position 1 (o'clock): 2 Maximum Distance 1 (cm): Yes N/A N/A Tunneling: Full  Thickness Without Exposed N/A N/A Classification: Support Structures Large N/A N/A Exudate Amount: Serosanguineous N/A N/A Exudate Type: red, brown N/A N/A Exudate Color: Large (67-100%) N/A N/A Granulation Amount: Red N/A N/A Granulation QualityKATURAH, [name] [name] ([id number]) [id number] Page 5 of 9 None Present (0%) N/A N/A Necrotic A mount: Fat Layer (Subcutaneous Tissue): Yes N/A N/A Exposed Structures: None N/A N/A Epithelialization: Treatment Notes Electronic Signature(s) Signed: 02/15/2023 4:27:20 PM By: [name] [name] Entered By: [name] [name] on 02/15/2023 12:39:31 -------------------------------------------------------------------------------- Multi-Disciplinary Care Plan Details Patient Name: Date of Service: [name] [name] [name] C. 02/15/2023 3:00 PM Medical Record Number: [id number] Patient Account Number: [id number] Date of Birth/Sex: Treating RN: [name] (89 y.o. [name] [name] Primary Care Lerone Onder: [name] [name] Other Clinician: Referring Rosi Secrist: Treating Granvil Djordjevic/Extender: [name] [name] in Treatment: 13 Active Inactive Pressure Nursing Diagnoses: Knowledge deficit related to causes and risk factors for pressure ulcer development Potential for impaired tissue integrity related to pressure,  friction, moisture, and shear Goals: Patient will remain free from development of additional pressure ulcers Date Initiated: 11/10/2022 Date Inactivated: 02/06/2023 Target Resolution Date: 02/04/2023 Goal Status: Met Patient will remain free of pressure ulcers Date Initiated: 11/10/2022 Target Resolution Date: 02/28/2023 Goal Status: Active Patient/caregiver will verbalize risk factors for pressure ulcer development Date Initiated: 11/10/2022 Date Inactivated: 11/10/2022 Target Resolution Date: 11/10/2022 Goal Status: Met Patient/caregiver will verbalize understanding of pressure ulcer management Date Initiated: 11/10/2022 Date Inactivated: 11/10/2022 Target Resolution Date: 11/10/2022 Goal Status: Met Interventions: Assess: immobility, friction, shearing, incontinence upon admission and as needed Assess offloading mechanisms upon admission and as needed Assess potential for pressure ulcer upon admission and as needed Provide education on pressure ulcers Notes: Wound/Skin Impairment Nursing Diagnoses: Impaired tissue integrity Knowledge deficit related to ulceration/compromised skin integrity Goals: Ulcer/skin breakdown will have a volume reduction of 30% by week 4 Date Initiated: 11/10/2022 Date Inactivated: 12/12/2022 Target Resolution Date: 12/08/2022 Goal Status: Met [name] [name], [name] [name] ([id number]) [phone].pdf Page 6 of 9 Ulcer/skin breakdown will have a volume reduction of 50% by week 8 Date Initiated: 11/10/2022 Date Inactivated: 01/16/2023 Target Resolution Date: 01/05/2023 Goal Status: Met Ulcer/skin breakdown will have a volume reduction of 80% by week 12 Date Initiated: 11/10/2022 Date Inactivated: 02/06/2023 Target Resolution Date: 02/02/2023 Goal Status: Met Ulcer/skin breakdown will heal within 14 weeks Date Initiated: 11/10/2022 Target Resolution Date: 02/16/2023 Goal Status: Active Interventions: Assess patient/caregiver ability to obtain  necessary supplies Assess patient/caregiver ability to perform ulcer/skin care regimen upon admission and as needed Assess ulceration(s) every visit Provide education on ulcer and skin care Notes: Electronic Signature(s) Signed: 02/15/2023 4:27:20 PM By: [name] [name] Entered By: [name] [name] on 02/15/2023 12:54:20 -------------------------------------------------------------------------------- Pain Assessment Details Patient Name: Date of Service: [name] [name], [name] [name] 02/15/2023 3:00 PM Medical Record Number: [id number] Patient Account Number: [id number] Date of Birth/Sex: Treating RN: [birth year] (89 y.o. [name] [name] Primary Care Malaika Arnall: [name] [name] Other Clinician: Referring Coalton Arch: Treating Ediberto Sens/Extender: [name] [name] in Treatment: 13 Active Problems Location of Pain Severity and Description of Pain Patient Has Paino No Site Locations Pain Management and Medication Current Pain Management: Electronic Signature(s) Signed: 02/15/2023 4:27:20 PM By: Barnet Pall, Derenda Fennel ([id number]) [id number] Page 7 of 9 Entered By: [name] [name] on 02/15/2023 12:05:24 -------------------------------------------------------------------------------- Patient/Caregiver Education Details Patient Name: Date of Service: [name] [name], [name] [name] 12/19/2024andnbsp3:00 PM Medical Record Number: [id number] Patient Account Number: [id number] Date of Birth/Gender: Treating RN: [name] (89 y.o. [name] [name] Primary Care Physician: [name] [name] Other Clinician: Referring Physician: Treating Physician/Extender:  Stone, Hoyt Jaquita Folds in Treatment: 13 Education Assessment Education Provided To: Patient Education Topics Provided Wound/Skin Impairment: Handouts: Caring for Your Ulcer Methods: Explain/Verbal Responses: State content correctly [employer]) Signed: 02/15/2023 4:27:20 PM By: [name] [name] Entered By: [name] [name] on 02/15/2023 12:57:57 -------------------------------------------------------------------------------- Wound Assessment Details Patient Name: Date of Service: [name] [name], LICKTEIG. 02/15/2023 3:00 PM Medical Record Number: [id number] Patient Account Number: [id number] Date of Birth/Sex: Treating RN: [birth year] [name] (89 y.o. [name] [name] Primary Care Man Bonneau: [name] [name] Other Clinician: Referring Dvante Hands: Treating Percy Comp/Extender: [name] [name] in Treatment: 13 Wound Status Wound Number: 1 Primary Abscess Etiology: Wound Location: Right, Medial Gluteus Wound Open Wounding Event: Gradually Appeared Status: Date Acquired: 09/08/2022 Comorbid Cataracts, Anemia, Asthma, Congestive Heart Failure, Weeks Of Treatment: 13 History: Hypertension, Type II Diabetes, Osteoarthritis Clustered Wound: No Photos [name] [name], [name] [name] ([id number]) [id number] Page 8 of 9 Wound Measurements Length: (cm) 2.2 Width: (cm) 2.9 Depth: (cm) 0.1 Area: (cm) 5.011 Volume: (cm) 0.501 % Reduction in Area: 82.6% % Reduction in Volume: 99.2% Epithelialization: None Tunneling: Yes Position (o'clock): 1 Maximum Distance: (cm) 2 Undermining: No Wound Description Classification: Full Thickness Without Exposed Suppor Exudate Amount: Large Exudate Type: Serosanguineous Exudate Color: red, brown t Structures Foul Odor After Cleansing: No Slough/Fibrino Yes Wound Bed Granulation Amount: Large (67-100%) Exposed Structure Granulation Quality: Red Fat Layer (Subcutaneous Tissue) Exposed: Yes Necrotic Amount: None Present (0%) Treatment Notes Wound #1 (Gluteus) Wound Laterality: Right, Medial Cleanser Vashe 5.8 (oz) Discharge Instruction: Use vashe 5.8 (oz) as directed Peri-Wound Care Topical Primary Dressing Iodoform 1/4x 5 (in/yd) Discharge Instruction: Apply Iodoform Packing Strip as instructed. Silvercel Small 2x2  (in/in) Discharge Instruction: Apply Silvercel Small 2x2 (in/in) as instructed Secondary Dressing (BORDER) Zetuvit Plus SILICONE BORDER Dressing 4x4 (in/in) Discharge Instruction: Please do not put silicone bordered dressings under wraps. Use non-bordered dressing only. Secured With Compression Wrap Compression Stockings Add-Ons Electronic Signature(s) Signed: 02/15/2023 4:27:20 PM By: [name] [name] Entered By: [name] [name] on 02/15/2023 12:08:08 Mikael Spray ([id number]) [id number] Page 9 of 9 -------------------------------------------------------------------------------- Vitals Details Patient Name: Date of Service: SHANIQA, MCGEORGE 02/15/2023 3:00 PM Medical Record Number: [id number] Patient Account Number: [id number] Date of Birth/Sex: Treating RN: [birth year] (89 y.o. [name] [name] Primary Care Nathaly Dawkins: [name] [name] Other Clinician: Referring Davyn Morandi: Treating Zeke Aker/Extender: [name] [name] in Treatment: 13 Vital Signs Time Taken: 15:00 Temperature (F): 98.1 Height (in): 66 Pulse (bpm): 67 Weight (lbs): 191 Respiratory Rate (breaths/min): 18 Body Mass Index (BMI): 30.8 Blood Pressure (mmHg): 144/71 Reference Range: 80 - 120 mg / dl Electronic Signature(s) Signed: 02/15/2023 4:27:20 PM By: [name] [name] Entered By: [name] [name] on 02/15/2023 12:04:44

## 2023-02-17 NOTE — Progress Notes (Signed)
[name] [name], CIOLLI ([id number]) [id number] Page 1 of 7 Visit Report for 02/15/2023 Chief Complaint Document Details Patient Name: Date of Service: [name], [name] 02/15/2023 3:00 PM Medical Record Number: [id number] Patient Account Number: [id number] Date of Birth/Sex: Treating RN: January 31, 1933 (89 y.o. [name] [name] Primary Care Provider: [name] Other Clinician: Referring Provider: Treating Provider/Extender: Lavonia Drafts in Treatment: 13 Information Obtained from: Patient Chief Complaint Bilateral Gluteal Ulcers and right leg skin tear Electronic Signature(s) Signed: 02/15/2023 3:57:55 PM By: Allen Derry PA-C Entered By: Allen Derry on 02/15/2023 12:57:55 -------------------------------------------------------------------------------- HPI Details Patient Name: Date of Service: [name] [name] [name] 02/15/2023 3:00 PM Medical Record Number: [id number] Patient Account Number: [id number] Date of Birth/Sex: Treating RN: 1932/06/22 (89 y.o. [name] [name] Primary Care Provider: [name] Other Clinician: Referring Provider: Treating Provider/Extender: Lavonia Drafts in Treatment: 13 History of Present Illness HPI Description: 11-10-2022 upon evaluation today patient presents for initial inspection here in our clinic concerning a wound which occurred unfortunately as a result of an abscess. This was the main right gluteal/sacral region. With that being said she noted this first around or just before July 12 when she was admitted to the hospital from the 12th through the 26. The wound was at that point drained and subsequently she thought that she was headed in the right direction. With that being said she ended up having to be admitted to the hospital August and this was due to a CHF exacerbation with a took 25 pounds of fluid off of her. Subsequently she was then rehab for the wound at [facility] burn and the  physician there apparently did an awesome job based on what I am seeing the wound actually looks like is doing quite well at this point. They were doing a fairly complex VAC with the small openings in multiple areas that were being backed as well at this point the good news is I do not think that he would need to be necessary based on what I am seeing. She does have home health coming out to do the wound VAC changes Monday, Wednesday, and Friday although get this scheduled going has been somewhat a concern due to the fact that the patient is been in and out of the hospital. Patient does have a history of having had a stroke in 2001 with some left-sided weakness. She also on September 08, 2022 had her A1c checked this was 9.8. She is a diabetic. Other than these 2 conditions she also has chronic kidney disease stage III, hypertension, and again obviously the wounds which were can be helping to manage at this point. 11-28-2022 upon evaluation today patient appears to be doing well currently in regard to her wound in the sacral area. Most of the surrounding wounds are actually closed and they look excellent. I am actually very pleased with where we stand. The only downside is she does have a skin tear which is still questionable whether it is going to resolved with a skin flap intact or if it is going skin flap did great and breakdown we will know more as it goes over the next [name] [name], [name] [name] ([id number]) [phone].pdf Page 2 of 7 couple of weeks. Nonetheless right now this actually does not look too bad definitely no signs of infection this is on the right lower leg. Otherwise the patient seems to be doing extremely well with regard to the wound VAC I think that this is doing an awesome job  I think putting some collagen down to the tunnel would be beneficial I think the black foam is still better than the white foam to be honest. 12-12-2022 upon evaluation today patient appears to  be doing pretty well currently in regard to her wound. She has been tolerating the wound VAC without complication and seems to be making good progress here. Fortunately I do not see any signs of infection locally or systemically which is great news and in general I do believe that we are making excellent headway towards closure which is great news as well. No fevers, chills, nausea, vomiting, or diarrhea. 12-26-2022 upon evaluation today patient's wound does appear to be doing much better. There does not appear to be any signs of active infection which is great news and in general I think that her making excellent headway towards closure which is great news. No fevers, chills, nausea, vomiting, or diarrhea. 01-16-2023 upon evaluation today patient appears to be doing well currently in regard to her wounds the leg is healed and the gluteal region is significantly improved. I really do not think there is much that a wound VAC can continue to help with at this point. I discussed that with the patient today. 02-06-2023 upon evaluation today patient appears to be doing well currently in regard to her wound. She has been tolerating the dressing changes without complication. With that being said the wound does show some hypergranulation we will continue to try to work on this to some degree here. With that being said I do believe the [facility] is a good option here but at the same time I think she still has some signs that she may have infection at this point as well. I will try to see what we can do about improving that overall. 02-15-2023 upon evaluation today patient appears to be doing about the same in regard to her wound she still has quite a bit of drainage I do not feel any bone in the bottom of the wound but she does have quite a bit of drainage that makes [place] concerned about infection. We can actually go ahead and check for infection by way of the PCR culture at this point. Electronic  Signature(s) Signed: 02/15/2023 5:31:04 PM By: Allen Derry PA-C Entered By: Allen Derry on 02/15/2023 14:31:04 -------------------------------------------------------------------------------- Physical Exam Details Patient Name: Date of Service: [name] [name], [name] [name] [name] 02/15/2023 3:00 PM Medical Record Number: [id number] Patient Account Number: [id number] Date of Birth/Sex: Treating RN: 05/27/1932 (89 y.o. [name] [name] Primary Care Provider: [name] Other Clinician: Referring Provider: Treating Provider/Extender: Lavonia Drafts in Treatment: 13 Constitutional Well-nourished and well-hydrated in no acute distress. Respiratory normal breathing without difficulty. Psychiatric this patient is able to make decisions and demonstrates good insight into disease process. Alert and Oriented x 3. pleasant and cooperative. Notes Upon inspection patient's wound bed actually has the issue of not keeping the [facility] in place it seems to slip out as she progresses throughout the day. This is the major complication at this point. I do believe that it may be best for [place] to attempt to try different type of packing I would recommend iodoform packing gauze and then alginate over top of this. Electronic Signature(s) Signed: 02/15/2023 5:31:25 PM By: Allen Derry PA-C Entered By: Allen Derry on 02/15/2023 14:31:25 Mikael Spray ([id number]) [id number] Page 3 of 7 -------------------------------------------------------------------------------- Physician Orders Details Patient Name: Date of Service: LEEANA, ARBELAEZ 02/15/2023 3:00 PM Medical Record Number: [id number]  Patient Account Number: [id number] Date of Birth/Sex: Treating RN: [birth year] (89 y.o. [name] [name] Primary Care Provider: [name] Other Clinician: Referring Provider: Treating Provider/Extender: Lavonia Drafts in Treatment: 13 The following  information was scribed by: Angelina Pih The information was scribed for: Allen Derry Verbal / Phone Orders: No Diagnosis Coding Follow-up Appointments Return Appointment in 1 week. Home Health Home Health Company: - [facility] for wound care. May utilize formulary equivalent dressing for wound treatment orders unless otherwise specified. Home Health Nurse may visit PRN to address patients wound care needs. Scheduled days for dressing changes to be completed; exception, patient has scheduled wound care visit that day. **Please direct any NON-WOUND related issues/requests for orders to patient's Primary Care Physician. **If current dressing causes regression in wound condition, may D/C ordered dressing product/s and apply Normal Saline Moist Dressing daily until next Wound Healing Center or Other MD appointment. **Notify Wound Healing Center of regression in wound condition at [phone]. Bathing/ Shower/ Hygiene No tub bath. Anesthetic (Use 'Patient Medications' Section for Anesthetic Order Entry) Lidocaine applied to wound bed Off-Loading Turn and reposition every 2 hours Other: - stay off bottom as much as possible Negative Pressure Wound Therapy Discontinue NPWT. Wound Treatment Wound #1 - Gluteus Wound Laterality: Right, Medial Cleanser: Vashe 5.8 (oz) 1 x Per Day/15 Days Discharge Instructions: Use vashe 5.8 (oz) as directed Prim Dressing: Iodoform 1/4x 5 (in/yd) 1 x Per Day/15 Days ary Discharge Instructions: Apply Iodoform Packing Strip as instructed. Prim Dressing: Silvercel Small 2x2 (in/in) 1 x Per Day/15 Days ary Discharge Instructions: Apply Silvercel Small 2x2 (in/in) as instructed Secondary Dressing: (BORDER) Zetuvit Plus SILICONE BORDER Dressing 4x4 (in/in) 1 x Per Day/15 Days Discharge Instructions: Please do not put silicone bordered dressings under wraps. Use non-bordered dressing only. Laboratory Bacteria identified in Wound by Culture  (MICRO) - PCR completed on right glute wound LOINC Code: 6462-6 Convenience Name: Wound culture routine Electronic Signature(s) Signed: 02/15/2023 4:27:20 PM By: Angelina Pih Signed: 02/16/2023 12:16:54 PM By: Kreg Shropshire, [place] C ([id number]) [id number] Page 4 of 7 Entered By: Angelina Pih on 02/15/2023 12:55:52 -------------------------------------------------------------------------------- Problem List Details Patient Name: Date of Service: ARLETA, APOLLO 02/15/2023 3:00 PM Medical Record Number: [id number] Patient Account Number: [id number] Date of Birth/Sex: Treating RN: December 06, 1932 (89 y.o. 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 without 11/10/2022 No Yes evidence of necrosis E11.622 Type 2 diabetes mellitus with other skin ulcer 11/10/2022 No Yes I10 Essential (primary) hypertension 11/10/2022 No Yes I50.42 Chronic combined systolic (congestive) and diastolic (congestive) heart failure 11/10/2022 No Yes N18.30 Chronic kidney disease, stage 3 unspecified 11/10/2022 No Yes S81.811A Laceration without foreign body, right lower leg, initial encounter 11/28/2022 No Yes Inactive Problems Resolved Problems Electronic Signature(s) Signed: 02/15/2023 3:57:44 PM By: Allen Derry PA-C Entered By: Allen Derry on 02/15/2023 12:57:44 Mikael Spray ([id number]) [id number] Page 5 of  7 -------------------------------------------------------------------------------- Progress Note Details Patient Name: Date of Service: [name] [name], [name] [name] 02/15/2023 3:00 PM Medical Record Number: [id number] Patient Account Number: [id number] Date of Birth/Sex: Treating RN: [birth year] (89 y.o. [name] [name] Primary Care Provider: [name] Other Clinician: Referring  Provider: Treating Provider/Extender: Lavonia Drafts in Treatment: 13 Subjective Chief Complaint Information obtained from Patient Bilateral Gluteal Ulcers and right leg skin tear History of Present Illness (HPI) 11-10-2022 upon evaluation today patient presents for initial inspection here in our clinic concerning a wound which occurred unfortunately as a result of an abscess. This was the main right gluteal/sacral region. With that being said she noted this first around or just before July 12 when she was admitted to the hospital from the 12th through the 26. The wound was at that point drained and subsequently she thought that she was headed in the right direction. With that being said she ended up having to be admitted to the hospital August and this was due to a CHF exacerbation with a took 25 pounds of fluid off of her. Subsequently she was then rehab for the wound at [facility] burn and the physician there apparently did an awesome job based on what I am seeing the wound actually looks like is doing quite well at this point. They were doing a fairly complex VAC with the small openings in multiple areas that were being backed as well at this point the good news is I do not think that he would need to be necessary based on what I am seeing. She does have home health coming out to do the wound VAC changes Monday, Wednesday, and Friday although get this scheduled going has been somewhat a concern due to the fact that the patient is been in and out of the hospital. Patient does have a history of having had  a stroke in 2001 with some left-sided weakness. She also on September 08, 2022 had her A1c checked this was 9.8. She is a diabetic. Other than these 2 conditions she also has chronic kidney disease stage III, hypertension, and again obviously the wounds which were can be helping to manage at this point. 11-28-2022 upon evaluation today patient appears to be doing well currently in regard to her wound in the sacral area. Most of the surrounding wounds are actually closed and they look excellent. I am actually very pleased with where we stand. The only downside is she does have a skin tear which is still questionable whether it is going to resolved with a skin flap intact or if it is going skin flap did great and breakdown we will know more as it goes over the next couple of weeks. Nonetheless right now this actually does not look too bad definitely no signs of infection this is on the right lower leg. Otherwise the patient seems to be doing extremely well with regard to the wound VAC I think that this is doing an awesome job I think putting some collagen down to the tunnel would be beneficial I think the black foam is still better than the white foam to be honest. 12-12-2022 upon evaluation today patient appears to be doing pretty well currently in regard to her wound. She has been tolerating the wound VAC without complication and seems to be making good progress here. Fortunately I do not see any signs of infection locally or systemically which is great news and in general I do believe that we are making excellent headway towards closure which is great news as well. No fevers, chills, nausea, vomiting, or diarrhea. 12-26-2022 upon evaluation today patient's wound does appear to be doing much better. There does not appear to be any signs of active infection which is great news and in general  I think that her making excellent headway towards closure which is great news. No fevers, chills, nausea, vomiting, or  diarrhea. 01-16-2023 upon evaluation today patient appears to be doing well currently in regard to her wounds the leg is healed and the gluteal region is significantly improved. I really do not think there is much that a wound VAC can continue to help with at this point. I discussed that with the patient today. 02-06-2023 upon evaluation today patient appears to be doing well currently in regard to her wound. She has been tolerating the dressing changes without complication. With that being said the wound does show some hypergranulation we will continue to try to work on this to some degree here. With that being said I do believe the [facility] is a good option here but at the same time I think she still has some signs that she may have infection at this point as well. I will try to see what we can do about improving that overall. 02-15-2023 upon evaluation today patient appears to be doing about the same in regard to her wound she still has quite a bit of drainage I do not feel any bone in the bottom of the wound but she does have quite a bit of drainage that makes [place] concerned about infection. We can actually go ahead and check for infection by way of the PCR culture at this point. Objective Constitutional Well-nourished and well-hydrated in no acute distress. Vitals Time Taken: 3:00 PM, Height: 66 in, Weight: 191 lbs, BMI: 30.8, Temperature: 98.1 F, Pulse: 67 bpm, Respiratory Rate: 18 breaths/min, Blood Pressure: 144/71 mmHg. FREDDY, CRUELL ([id number]) [id number] Page 6 of 7 Respiratory normal breathing without difficulty. Psychiatric this patient is able to make decisions and demonstrates good insight into disease process. Alert and Oriented x 3. pleasant and cooperative. General Notes: Upon inspection patient's wound bed actually has the issue of not keeping the [facility] in place it seems to slip out as she progresses throughout the day. This is the  major complication at this point. I do believe that it may be best for [place] to attempt to try different type of packing I would recommend iodoform packing gauze and then alginate over top of this. Integumentary (Hair, Skin) Wound #1 status is Open. Original cause of wound was Gradually Appeared. The date acquired was: 09/08/2022. The wound has been in treatment 13 weeks. The wound is located on the Right,Medial Gluteus. The wound measures 2.2cm length x 2.9cm width x 0.1cm depth; 5.011cm^2 area and 0.501cm^3 volume. There is Fat Layer (Subcutaneous Tissue) exposed. There is no undermining noted, however, there is tunneling at 1:00 with a maximum distance of 2cm. There is a large amount of serosanguineous drainage noted. There is large (67-100%) red granulation within the wound bed. There is no necrotic tissue within the wound bed. 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 Home Health: Home Health Company: - [facility] for wound care. May utilize formulary equivalent dressing for wound treatment orders unless otherwise specified. Home Health Nurse may visit PRN to address patients wound care  needs. Scheduled days for dressing changes to be completed; exception, patient has scheduled wound care visit that day. **Please direct any NON-WOUND related issues/requests for orders to patient's Primary Care Physician. **If current dressing causes  regression in wound condition, may D/C ordered dressing product/s and apply Normal Saline Moist Dressing daily until next Wound Healing Center or Other MD appointment. **Notify Wound Healing Center of regression in wound condition at [phone]. Bathing/ Shower/ Hygiene: No tub bath. Anesthetic (Use 'Patient Medications' Section for Anesthetic Order Entry): Lidocaine applied to wound bed Off-Loading: Turn and reposition every 2 hours Other: - stay off bottom as much as possible Negative Pressure Wound Therapy: Discontinue NPWT. Laboratory ordered were: Wound culture routine - PCR completed on right glute wound WOUND #1: - Gluteus Wound Laterality: Right, Medial Cleanser: Vashe 5.8 (oz) 1 x Per Day/15 Days Discharge Instructions: Use vashe 5.8 (oz) as directed Prim Dressing: Iodoform 1/4x 5 (in/yd) 1 x Per Day/15 Days ary Discharge Instructions: Apply Iodoform Packing Strip as instructed. Prim Dressing: Silvercel Small 2x2 (in/in) 1 x Per Day/15 Days ary Discharge Instructions: Apply Silvercel Small 2x2 (in/in) as instructed Secondary Dressing: (BORDER) Zetuvit Plus SILICONE BORDER Dressing 4x4 (in/in) 1 x Per Day/15 Days Discharge Instructions: Please do not put silicone bordered dressings under wraps. Use non-bordered dressing only. 1. I would recommend that we have the patient continue to monitor for any signs of infection or worsening. Based on what I see I do believe silver alginate may do better than the [facility] Blue ready to give that a shot. 2. I am going to recommend continue with packing a little bit use iodoform packing gauze. 3. I am also going to recommend as well that she should continue with appropriate offloading I think this is still gena be of utmost importance going forward. We will see patient back for reevaluation in 1 week here in the clinic. If anything worsens or changes patient will contact our office for additional recommendations. Electronic  Signature(s) Signed: 02/15/2023 5:37:05 PM By: Kreg Shropshire, Saly[place] C ([id number]) [id number] Page 7 of 7 Entered By: Allen Derry on 02/15/2023 14:37:05 -------------------------------------------------------------------------------- SuperBill Details Patient Name: Date of Service: [name] [name], [name] [name] 02/15/2023 Medical Record Number: [id number] Patient Account Number: [id number] Date of Birth/Sex: Treating RN: 1932/08/12 (89 y.o. 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 E11.622 Type 2 diabetes mellitus with other skin ulcer I10 Essential (primary) hypertension I50.42 Chronic combined systolic (congestive) and diastolic (congestive) heart failure N18.30 Chronic kidney disease, stage 3 unspecified S81.811A Laceration without foreign body, right lower leg, initial encounter Facility Procedures : CPT4 Code: 29518841 Description: 99213 - WOUND CARE VISIT-LEV 3 EST PT Modifier: Quantity: 1 Physician Procedures : CPT4 Code Description Modifier [id number] 99213 - WC PHYS LEVEL 3 - EST PT ICD-10 Diagnosis Description T81.31XA Disruption of external operation (surgical) wound, not elsewhere classified, initial encounter L02.31 Cutaneous abscess of buttock L98.415  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

## 2023-02-22 ENCOUNTER — Telehealth: Payer: Self-pay | Admitting: *Deleted

## 2023-02-22 ENCOUNTER — Telehealth: Payer: Self-pay | Admitting: Cardiology

## 2023-02-22 ENCOUNTER — Encounter: Payer: Medicare Other | Admitting: Internal Medicine

## 2023-02-22 DIAGNOSIS — T8131XA Disruption of external operation (surgical) wound, not elsewhere classified, initial encounter: Secondary | ICD-10-CM | POA: Diagnosis not present

## 2023-02-22 NOTE — Telephone Encounter (Signed)
Pt c/o medication issue:  1. Name of Medication:  torsemide (DEMADEX) 20 MG tablet (Expired)   2. How are you currently taking this medication (dosage and times per day)? As prescribed   3. Are you having a reaction (difficulty breathing--STAT)? Yes  4. What is your medication issue? Patient's daughter in law is calling to report the patient's weight has gone from 185.6 lbs to 181.8 lbs in ten days. She reports Dr. Servando Salina had previously decreased her torsemide to every other day and was wondering if the same should be done due to weight loss. Per daughter in law BP has been stable reading 129/55 today. Please advise.

## 2023-02-22 NOTE — Telephone Encounter (Signed)
Spoke to patient, as well as Cheron Every (Daughter in law who is not on DPR but would like to be added at next office visit).  They are c/o weight loss (about 4 pounds in last ten days).  She has not changed her diet at all, so this would not be the cause of her weight loss. Pt takes Torsemide 20 mg once daily.  Per last office visit note, Dr. Servando Salina advises that pt adjust dose to maintain weight of 185 lbs.  Pt was advised to take half (10 mg) one day, then 20 mg the next day, alternating like this until her weight reaches 185 lbs. No other complains at this moment.  BP is stable, and has some slight dizziness once per week. Will forward to Dr. Servando Salina for advice.  Pt has 3 mo f/u with Dr. Servando Salina on 03/28/2023.

## 2023-02-22 NOTE — Telephone Encounter (Signed)
Spoke to pt again to give her Dr. Mallory Shirk advice on continuing the current dose of Torsemide 20 mg once daily.  She should continue to track/log her weights and follow up with PCP if needed.  She will see Dr. Servando Salina on 03/28/2023.  Patieint (and daughter in law) verbalized understanding and in agreement with plan.

## 2023-02-26 NOTE — Progress Notes (Signed)
HUBERT, PIETROPAOLO ([id number]) [id number] Page 1 of 6 Visit Report for 02/22/2023 HPI Details Patient Name: Date of Service: [name] [name], [name] [name] 02/22/2023 10:45 A M Medical Record Number: [id number] Patient Account Number: [id number] Date of Birth/Sex: Treating RN: Jan 26, 1933 (89 y.o. [name] Provider: [name] Other Clinician: Betha Loa Referring Provider: Treating Provider/Extender: Chauncey Mann, MICHA EL Harrietta Guardian in Treatment: 14 History of Present Illness HPI Description: 11-10-2022 upon evaluation today patient presents for initial inspection here in our clinic concerning a wound which occurred unfortunately as a result of an abscess. This was the main right gluteal/sacral region. With that being said she noted this first around or just before July 12 when she was admitted to the hospital from the 12th through the 26. The wound was at that point drained and subsequently she thought that she was headed in the right direction. With that being said she ended up having to be admitted to the hospital August and this was due to a CHF exacerbation with a took 25 pounds of fluid off of her. Subsequently she was then rehab for the wound at [facility] burn and the physician there apparently did an awesome job based on what I am seeing the wound actually looks like is doing quite well at this point. They were doing a fairly complex VAC with the small openings in multiple areas that were being backed as well at this point the good news is I do not think that he would need to be necessary based on what I am seeing. She does have home health coming out to do the wound VAC changes Monday, Wednesday, and Friday although get this scheduled going has been somewhat a concern due to the fact that the patient is been in and out of the hospital. Patient does have a history of having had a stroke in 2001 with some left-sided weakness. She also on  September 08, 2022 had her A1c checked this was 9.8. She is a diabetic. Other than these 2 conditions she also has chronic kidney disease stage III, hypertension, and again obviously the wounds which were can be helping to manage at this point. 11-28-2022 upon evaluation today patient appears to be doing well currently in regard to her wound in the sacral area. Most of the surrounding wounds are actually closed and they look excellent. I am actually very pleased with where we stand. The only downside is she does have a skin tear which is still questionable whether it is going to resolved with a skin flap intact or if it is going skin flap did great and breakdown we will know more as it goes over the next couple of weeks. Nonetheless right now this actually does not look too bad definitely no signs of infection this is on the right lower leg. Otherwise the patient seems to be doing extremely well with regard to the wound VAC I think that this is doing an awesome job I think putting some collagen down to the tunnel would be beneficial I think the black foam is still better than the white foam to be honest. 12-12-2022 upon evaluation today patient appears to be doing pretty well currently in regard to her wound. She has been tolerating the wound VAC without complication and seems to be making good progress here. Fortunately I do not see any signs of infection locally or systemically which is great news and in general I do believe that we are making excellent  headway towards closure which is great news as well. No fevers, chills, nausea, vomiting, or diarrhea. 12-26-2022 upon evaluation today patient's wound does appear to be doing much better. There does not appear to be any signs of active infection which is great news and in general I think that her making excellent headway towards closure which is great news. No fevers, chills, nausea, vomiting, or diarrhea. 01-16-2023 upon evaluation today patient appears  to be doing well currently in regard to her wounds the leg is healed and the gluteal region is significantly improved. I really do not think there is much that a wound VAC can continue to help with at this point. I discussed that with the patient today. 02-06-2023 upon evaluation today patient appears to be doing well currently in regard to her wound. She has been tolerating the dressing changes without complication. With that being said the wound does show some hypergranulation we will continue to try to work on this to some degree here. With that being said I do believe the [facility] is a good option here but at the same time I think she still has some signs that she may have infection at this point as well. I will try to see what we can do about improving that overall. 02-15-2023 upon evaluation today patient appears to be doing about the same in regard to her wound she still has quite a bit of drainage I do not feel any bone in the bottom of the wound but she does have quite a bit of drainage that makes [place] concerned about infection. We can actually go ahead and check for infection by way of the PCR culture at this point. 12/26; patient has a surgical wound in the gluteal cleft on the right side. This is come in nicely and continues to contract nicely. She still has 1 probing area that goes precariously close to the bone. Still a fair amount of drainage reported. PCR culture from 02/15/2023 grew Bacteroides fragilis and Fusobacterium and Finegoldia. The latter 2 I do not think are usual pathogens. The patient is now on metronidazole It sounds as though she still spends a fair amount of time sitting up in the wheelchair at home. Electronic Signature(s) Signed: 02/23/2023 12:26:56 PM By: Baltazar Najjar MD Entered By: Baltazar Najjar on 02/22/2023 12:13:41 Mikael Spray ([id number]) [id number] Page 2 of  6 -------------------------------------------------------------------------------- Physical Exam Details Patient Name: Date of Service: [name] [name], [name] [name] 02/22/2023 10:45 A M Medical Record Number: [id number] Patient Account Number: [id number] Date of Birth/Sex: Treating RN: Jun 19, 1932 (89 y.o. [name] Provider: [name] Other Clinician: Betha Loa Referring Provider: Treating Provider/Extender: RO BSO N, MICHA EL Harrietta Guardian in Treatment: 14 Notes Wound exam; oval-shaped wound in the gluteal cleft on the right side. Granulation tissue looks healthy. The wound has improved in dimensions however there is still a small deep probing area in 1 location. I saw no evidence of purulent drainage no tenderness in the periwound Electronic Signature(s) Signed: 02/23/2023 12:26:56 PM By: Baltazar Najjar MD Entered By: Baltazar Najjar on 02/22/2023 12:16:50 -------------------------------------------------------------------------------- Physician Orders Details Patient Name: Date of Service: Jena Gauss C. 02/22/2023 10:45 A M Medical Record Number: [id number] Patient Account Number: [id number] Date of Birth/Sex: Treating RN: 13-Aug-1932 (89 y.o. [name] Provider: [name] Other Clinician: Betha Loa Referring Provider: Treating Provider/Extender: RO BSO Dorris Carnes, MICHA EL Harrietta Guardian in Treatment: 14 The following information was scribed by: Glynda Jaeger,  Angie The information was scribed for: Megahn Killings G Verbal / Phone Orders: No Diagnosis Coding Follow-up Appointments Return Appointment in 1 week. Home Health Home Health Company: - [facility] for wound care. May utilize formulary equivalent dressing for wound treatment orders unless otherwise specified. Home Health Nurse may visit PRN to address patients wound care needs. Scheduled days for dressing changes to be completed; exception,  patient has scheduled wound care visit that day. **Please direct any NON-WOUND related issues/requests for orders to patient's Primary Care Physician. **If current dressing causes regression in wound condition, may D/C ordered dressing product/s and apply Normal Saline Moist Dressing daily until next Wound Healing Center or Other MD appointment. **Notify Wound Healing Center of regression in wound condition at [phone]. Bathing/ Shower/ Hygiene No tub bath. Anesthetic (Use 'Patient Medications' Section for Anesthetic Order Entry) Lidocaine applied to wound bed Off-Loading DARILYN, NOLTE ([id number]) [phone].pdf Page 3 of 6 Turn and reposition every 2 hours Other: - stay off bottom as much as possible Negative Pressure Wound Therapy Discontinue NPWT. Wound Treatment Wound #1 - Gluteus Wound Laterality: Right, Medial Cleanser: Vashe 5.8 (oz) 1 x Per Day/15 Days Discharge Instructions: Use vashe 5.8 (oz) as directed Prim Dressing: Iodoform 1/4x 5 (in/yd) 1 x Per Day/15 Days ary Discharge Instructions: Apply Iodoform Packing Strip as instructed. Prim Dressing: Silvercel Small 2x2 (in/in) 1 x Per Day/15 Days ary Discharge Instructions: Apply Silvercel Small 2x2 (in/in) as instructed Secondary Dressing: (BORDER) Zetuvit Plus SILICONE BORDER Dressing 4x4 (in/in) 1 x Per Day/15 Days Discharge Instructions: Please do not put silicone bordered dressings under wraps. Use non-bordered dressing only. Electronic Signature(s) Signed: 02/23/2023 12:26:56 PM By: Baltazar Najjar MD Signed: 02/26/2023 11:29:00 AM By: Betha Loa Entered By: Betha Loa on 02/22/2023 11:48:38 -------------------------------------------------------------------------------- Problem List Details Patient Name: Date of Service: Jena Gauss C. 02/22/2023 10:45 A M Medical Record Number: [id number] Patient Account Number: [id number] Date of Birth/Sex: Treating RN: 1932-11-01 (89  y.o. 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 without 11/10/2022 No Yes evidence of necrosis E11.622 Type 2 diabetes mellitus with other skin ulcer 11/10/2022 No Yes I10 Essential (primary) hypertension 11/10/2022 No Yes NEAL, ROMANCHUK ([id number]) [id number] Page 4 of 6 I50.42 Chronic combined systolic (congestive) and diastolic (congestive) heart failure 11/10/2022 No Yes N18.30 Chronic kidney disease, stage 3 unspecified 11/10/2022 No Yes S81.811A Laceration without foreign body, right lower leg, initial encounter 11/28/2022 No Yes Inactive Problems Resolved Problems Electronic Signature(s) Signed: 02/23/2023 12:26:56 PM By: Baltazar Najjar MD Entered By: Baltazar Najjar on 02/22/2023 12:08:14 -------------------------------------------------------------------------------- Progress Note Details Patient Name: Date of Service: Erich Montane. 02/22/2023 10:45 A M Medical Record Number: [id number] Patient Account Number: [id number] Date of Birth/Sex: Treating RN: 06/01/1932 (89 y.o. [name] Provider: [name] Other Clinician: Betha Loa Referring Provider: Treating Provider/Extender: Chauncey Mann, MICHA EL Harrietta Guardian in Treatment: 14 Subjective History of Present Illness (HPI) 11-10-2022 upon evaluation today patient presents for initial inspection here in our clinic concerning a wound which occurred unfortunately as a result of an abscess.  This was the main right gluteal/sacral region.  With that being said she noted this first around or just before July 12 when she was admitted to the hospital from the 12th through the 26. The wound was at that point drained and subsequently she thought that she was headed in the right direction. With that being said she ended up having to be admitted to the hospital August and this was due to a CHF exacerbation with a took 25 pounds of fluid off of her. Subsequently she was then rehab for the wound at [facility] burn and the physician there apparently did an awesome job based on what I am seeing the wound actually looks like is doing quite well at this point. They were doing a fairly complex VAC with the small openings in multiple areas that were being backed as well at this point the good news is I do not think that he would need to be necessary based on what I am seeing. She does have home health coming out to do the wound VAC changes Monday, Wednesday, and Friday although get this scheduled going has been somewhat a concern due to the fact that the patient is been in and out of the hospital. Patient does have a history of having had a stroke in 2001 with some left-sided weakness. She also on September 08, 2022 had her A1c checked this was 9.8. She is a diabetic. Other than these 2 conditions she also has chronic kidney disease stage III, hypertension, and again obviously the wounds which were can be helping to manage at this point. 11-28-2022 upon evaluation today patient appears to be doing well currently in regard to her wound in the sacral area. Most of the surrounding wounds are actually closed and they look excellent. I am actually very pleased with where we stand. The only downside is she does have a skin tear which is still questionable whether it is going to resolved with a skin flap intact or if it is going skin flap did great and breakdown we will know more as it goes over the next couple of weeks.  Nonetheless right now this actually does not look too bad definitely no signs of infection this is on the right lower leg. Otherwise the patient seems to be doing extremely well with regard to the wound VAC I think that this is doing an awesome job I think putting some collagen down to the tunnel would be beneficial I think the black foam is still better than the white foam to be honest. 12-12-2022 upon evaluation today patient appears to be doing pretty well currently in regard to her wound. She has been tolerating the wound VAC without complication and seems to be making good progress here. Fortunately I do not see any signs of infection locally or systemically which is great news and in general I do believe that we are making excellent headway towards closure which is great news as well. No fevers, chills, nausea, vomiting, or diarrhea. 12-26-2022 upon evaluation today patient's wound does appear to be doing much better. There does not appear to be any signs of active infection which is great news and in general I think that her making excellent headway towards closure which is great news. No fevers, chills, nausea, vomiting, or diarrhea. 01-16-2023 upon evaluation today patient appears to be doing well currently in regard to her wounds the leg is healed and the gluteal region is significantly improved. I really do not think there is much that a wound VAC can continue to help  with at this point. I discussed that with the patient today. 02-06-2023 upon evaluation today patient appears to be doing well currently in regard to her wound. She has been tolerating the dressing changes without IASHA, RENEW ([id number]) [phone].pdf Page 5 of 6 complication. With that being said the wound does show some hypergranulation we will continue to try to work on this to some degree here. With that being said I do believe the [facility] is a good option here but at the same time I  think she still has some signs that she may have infection at this point as well. I will try to see what we can do about improving that overall. 02-15-2023 upon evaluation today patient appears to be doing about the same in regard to her wound she still has quite a bit of drainage I do not feel any bone in the bottom of the wound but she does have quite a bit of drainage that makes [place] concerned about infection. We can actually go ahead and check for infection by way of the PCR culture at this point. 12/26; patient has a surgical wound in the gluteal cleft on the right side. This is come in nicely and continues to contract nicely. She still has 1 probing area that goes precariously close to the bone. Still a fair amount of drainage reported. PCR culture from 02/15/2023 grew Bacteroides fragilis and Fusobacterium and Finegoldia. The latter 2 I do not think are usual pathogens. The patient is now on metronidazole It sounds as though she still spends a fair amount of time sitting up in the wheelchair at home. Objective Constitutional Vitals Time Taken: 11:15 AM, Height: 66 in, Weight: 191 lbs, BMI: 30.8, Temperature: 97.5 F, Pulse: 72 bpm, Respiratory Rate: 18 breaths/min, Blood Pressure: 166/79 mmHg. Integumentary (Hair, Skin) Wound #1 status is Open. Original cause of wound was Gradually Appeared. The date acquired was: 09/08/2022. The wound has been in treatment 14 weeks. The wound is located on the Right,Medial Gluteus. The wound measures 1.5cm length x 2cm width x 0.1cm depth; 2.356cm^2 area and 0.236cm^3 volume. There is Fat Layer (Subcutaneous Tissue) exposed. There is tunneling at 1:00 with a maximum distance of 2.4cm. There is a large amount of serosanguineous drainage noted. There is large (67-100%) red granulation within the wound bed. There is no necrotic tissue within the wound bed. 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 Home Health: Home Health Company: - [facility] for wound care. May utilize formulary equivalent dressing for wound treatment orders unless otherwise specified. Home Health Nurse may visit PRN to address patients wound care needs. Scheduled days for dressing changes to be completed; exception, patient has scheduled wound care visit that day. **Please direct any NON-WOUND related issues/requests for orders to patient's Primary Care Physician. **If current dressing causes regression in wound condition, may D/C ordered dressing product/s and apply Normal Saline Moist Dressing daily until next Wound Healing Center or Other MD appointment. **Notify Wound Healing Center of regression in wound condition at [phone]. Bathing/ Shower/ Hygiene: No tub bath. Anesthetic (Use 'Patient Medications' Section for  Anesthetic Order Entry): Lidocaine applied to wound bed Off-Loading: Turn and reposition every 2 hours Other: - stay off bottom as much as possible Negative Pressure Wound Therapy: Discontinue NPWT . WOUND #1: - Gluteus Wound Laterality: Right, Medial Cleanser: Vashe 5.8 (oz) 1 x Per Day/15 Days Discharge Instructions: Use vashe 5.8 (oz) as directed Prim Dressing: Iodoform 1/4x 5 (in/yd) 1 x Per Day/15 Days ary Discharge Instructions: Apply Iodoform Packing Strip as instructed. Prim Dressing: Silvercel Small 2x2 (in/in) 1 x Per Day/15 Days ary Discharge Instructions: Apply Silvercel Small 2x2  (in/in) as instructed Secondary Dressing: (BORDER) Zetuvit Plus SILICONE BORDER Dressing 4x4 (in/in) 1 x Per Day/15 Days Discharge Instructions: Please do not put silicone bordered dressings under wraps. Use non-bordered dressing only. NICO, DADA ([id number]) [id number] Page 6 of 6 1. I did not change the primary dressing which is iodoform packing and silver alginate although apparently the iodoform packing does not stay in place well. 2. There is no evidence of active infection although the small probing area goes precariously close to underlying bone. 3. The wound measurements are improved and the surface of the wound looks fairly good Electronic Signature(s) Signed: 02/23/2023 12:26:56 PM By: Baltazar Najjar MD Entered By: Baltazar Najjar on 02/22/2023 12:17:38 -------------------------------------------------------------------------------- SuperBill Details Patient Name: Date of Service: Erich Montane 02/22/2023 Medical Record Number: [id number] Patient Account Number: [id number] Date of Birth/Sex: Treating RN: 03-08-1932 (89 y.o. 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 E11.622 Type 2 diabetes mellitus with other skin ulcer I10 Essential (primary) hypertension I50.42 Chronic combined systolic (congestive) and diastolic (congestive) heart failure N18.30 Chronic kidney disease, stage 3 unspecified S81.811A Laceration without foreign body, right lower leg, initial encounter Facility Procedures : CPT4 Code:  [id number] Description: 99213 - WOUND CARE VISIT-LEV 3 EST PT Modifier: Quantity: 1 Physician Procedures : CPT4 Code Description Modifier [id number] - WC PHYS LEVEL 3 - EST PT ICD-10 Diagnosis Description 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

## 2023-02-26 NOTE — Progress Notes (Signed)
JOYOUS, LOEHRER ([id number]) [id number] Page 1 of 9 Visit Report for 02/22/2023 Arrival Information Details Patient Name: Date of Service: [name] [name], [name] [name] 02/22/2023 10:45 A M Medical Record Number: [id number] Patient Account Number: [id number] Date of Birth/Sex: Treating RN: [birth year] (89 y.o. [name] [name] Primary Care [name] [name]: [name] [name] Other Clinician: Betha [name] Referring [name] [name]: Treating [name] [name]/Extender: [name] [name] [name] [name], [name] [name] Guardian in Treatment: 14 Visit Information History Since Last Visit All ordered tests and consults were completed: No Patient Arrived: Wheel Chair Added or deleted any medications: No Arrival Time: 11:13 Any new allergies or adverse reactions: No Transfer Assistance: EasyPivot Patient Lift Had a fall or experienced change in No Patient Identification Verified: Yes activities of daily living that may affect Secondary Verification Process Completed: Yes risk of falls: Patient Requires Transmission-Based No Signs or symptoms of abuse/neglect since last visito No Precautions: Hospitalized since last visit: No Patient Has Alerts: Yes Implantable device outside of the clinic excluding No Patient Alerts: Patient on Blood Thinner cellular tissue based products placed in the center type 2 diabetic since last visit: Stroke 2021, R sided Has Dressing in Place as Prescribed: Yes weak Pain Present Now: No Electronic Signature(s) Signed: 02/26/2023 11:29:00 AM By: [name] [name] Entered By: [name] [name] on 02/22/2023 11:15:02 -------------------------------------------------------------------------------- Clinic Level of Care Assessment Details Patient Name: Date of Service: [name] [name], [name] [name] 02/22/2023 10:45 A M Medical Record Number: [id number] Patient Account Number: [id number] Date of Birth/Sex: Treating RN: February 17, 1933 (89 y.o. [name] [name] Primary Care Wayman Hoard: [name] [name] Other Clinician: Betha [name] Referring Yeila Morro: Treating [name] [name]/Extender: [name] [name] N, [name] [name] Guardian in Treatment: 14 Clinic Level of Care Assessment Items TOOL 4 Quantity Score []  - 0 Use when only an EandM is performed on FOLLOW-UP visit ASSESSMENTS - Nursing Assessment / Reassessment X- 1 10 Reassessment of Co-morbidities (includes updates in patient status) X- 1 5 Reassessment of Adherence to Treatment Plan SAILOR, KALLENBERGER ([id number]) [id number] Page 2 of 9 ASSESSMENTS - Wound and Skin A ssessment / Reassessment X - Simple Wound Assessment / Reassessment - one wound 1 5 []  - 0 Complex Wound Assessment / Reassessment - multiple wounds []  - 0 Dermatologic / Skin Assessment (not related to wound area) ASSESSMENTS - Focused Assessment []  - 0 Circumferential Edema Measurements - multi extremities []  - 0 Nutritional Assessment / Counseling / Intervention []  - 0 Lower Extremity Assessment (monofilament, tuning fork, pulses) []  - 0 Peripheral Arterial Disease Assessment (using hand held doppler) ASSESSMENTS - Ostomy and/or Continence Assessment and Care []  - 0 Incontinence Assessment and Management []  - 0 Ostomy Care Assessment and Management (repouching, etc.) PROCESS - Coordination of Care X - Simple Patient / Family Education for ongoing care 1 15 []  - 0 Complex (extensive) Patient / Family Education for ongoing care []  - 0 Staff obtains Chiropractor, Records, T Results / Process Orders est []  - 0 Staff telephones HHA, Nursing Homes / Clarify orders / etc []  - 0 Routine Transfer to another Facility (non-emergent condition) []  - 0 Routine Hospital Admission (non-emergent condition) []  - 0 New Admissions / Manufacturing engineer / Ordering NPWT Apligraf, etc. , []  - 0 Emergency Hospital Admission (emergent condition) X- 1 10 Simple Discharge Coordination []  - 0 Complex (extensive) Discharge  Coordination PROCESS - Special Needs []  - 0 Pediatric / Minor Patient Management []  - 0 Isolation Patient Management []  - 0 Hearing / Language / Visual special needs []  - 0 Assessment of Community  assistance (transportation, D/C planning, etc.) []  - 0 Additional assistance / Altered mentation []  - 0 Support Surface(s) Assessment (bed, cushion, seat, etc.) INTERVENTIONS - Wound Cleansing / Measurement X - Simple Wound Cleansing - one wound 1 5 []  - 0 Complex Wound Cleansing - multiple wounds X- 1 5 Wound Imaging (photographs - any number of wounds) []  - 0 Wound Tracing (instead of photographs) X- 1 5 Simple Wound Measurement - one wound []  - 0 Complex Wound Measurement - multiple wounds INTERVENTIONS - Wound Dressings []  - 0 Small Wound Dressing one or multiple wounds []  - 0 Medium Wound Dressing one or multiple wounds X- 1 20 Large Wound Dressing one or multiple wounds []  - 0 Application of Medications - topical []  - 0 Application of Medications - injection INTERVENTIONS - Miscellaneous []  - 0 External ear exam [name] [name], [name] [name] ([id number]) [phone] Page 3 of 9 []  - 0 Specimen Collection (cultures, biopsies, blood, body fluids, etc.) []  - 0 Specimen(s) / Culture(s) sent or taken to Lab for analysis []  - 0 Patient Transfer (multiple staff / Michiel Sites Lift / Similar devices) []  - 0 Simple Staple / Suture removal (25 or less) []  - 0 Complex Staple / Suture removal (26 or more) []  - 0 Hypo / Hyperglycemic Management (close monitor of Blood Glucose) []  - 0 Ankle / Brachial Index (ABI) - do not check if billed separately X- 1 5 Vital Signs Has the patient been seen at the hospital within the last three years: Yes Total Score: 85 Level Of Care: New/Established - Level 3 Electronic Signature(s) Signed: 02/26/2023 11:29:00 AM By: [name] [name] Entered By: [name] [name] on 02/22/2023  11:49:03 -------------------------------------------------------------------------------- Encounter Discharge Information Details Patient Name: Date of Service: [name] Montane. 02/22/2023 10:45 A M Medical Record Number: [id number] Patient Account Number: [id number] Date of Birth/Sex: Treating RN: 1932-06-24 (89 y.o. [name] [name] Primary Care Amulya Quintin: [name] [name] Other Clinician: Betha [name] Referring Tanika Bracco: Treating Avie Checo/Extender: [name] [name] [name] [name], [name] [name] Guardian in Treatment: 14 Encounter Discharge Information Items Discharge Condition: Stable Ambulatory Status: Wheelchair Discharge Destination: Home Transportation: Private Auto Accompanied By: son Schedule Follow-up Appointment: Yes Clinical Summary of Care: Electronic Signature(s) Signed: 02/26/2023 11:29:00 AM By: [name] [name] Entered By: [name] [name] on 02/22/2023 16:10:36 Lower Extremity Assessment Details -------------------------------------------------------------------------------- Mikael Spray ([id number]) [id number] Page 4 of 9 Patient Name: Date of Service: KANITHA, GRANVILLE 02/22/2023 10:45 A M Medical Record Number: [id number] Patient Account Number: [id number] Date of Birth/Sex: Treating RN: 30-Jan-1933 (89 y.o. [name] [name] Primary Care Meaghan Whistler: [name] [name] Other Clinician: Betha [name] Referring Shakina Choy: Treating Quintessa Simmerman/Extender: [name] [name] [name] [name], [name] [name] Guardian in Treatment: 14 Electronic Signature(s) Signed: 02/22/2023 4:09:49 PM By: Angelina Pih Signed: 02/26/2023 11:29:00 AM By: [name] [name] Entered By: [name] [name] on 02/22/2023 11:27:05 -------------------------------------------------------------------------------- Multi Wound Chart Details Patient Name: Date of Service: [name] Gauss C. 02/22/2023 10:45 A M Medical Record Number: [id number] Patient Account Number: [id number] Date of Birth/Sex:  Treating RN: February 22, 1933 (89 y.o. [name] [name] Primary Care Konner Saiz: [name] [name] Other Clinician: Betha [name] Referring Quenton Recendez: Treating Yeva Bissette/Extender: [name] [name] N, [name] [name] Guardian in Treatment: 14 Vital Signs Height(in): 66 Pulse(bpm): 72 Weight(lbs): 191 Blood Pressure(mmHg): 166/79 Body Mass Index(BMI): 30.8 Temperature(F): 97.5 Respiratory Rate(breaths/min): 18 [1:Photos:] [N/A:N/A] Right, Medial Gluteus N/A N/A Wound Location: Gradually Appeared N/A N/A Wounding Event: Abscess N/A N/A Primary Etiology: Cataracts, Anemia, Asthma, N/A N/A Comorbid History: Congestive Heart Failure, Hypertension, Type II  Diabetes, Osteoarthritis 09/08/2022 N/A N/A Date Acquired: 14 N/A N/A Weeks of Treatment: Open N/A N/A Wound Status: No N/A N/A Wound Recurrence: 1.5x2x0.1 N/A N/A Measurements L x W x D (cm) 2.356 N/A N/A A (cm) : rea 0.236 N/A N/A Volume (cm) : 91.80% N/A N/A % Reduction in Area: 99.60% N/A N/A % Reduction in Volume: Full Thickness Without Exposed N/A N/A Classification: Support Structures Large N/A N/A Exudate Amount: Serosanguineous N/A N/A Exudate Type: red, brown N/A N/A Exudate Color: Large (67-100%) N/A N/A Granulation Amount: Red N/A N/A Granulation Quality: None Present (0%) N/A N/A Necrotic Amount: ROSELINA, LANINGHAM ([id number]) [id number] Page 5 of 9 Fat Layer (Subcutaneous Tissue): Yes N/A N/A Exposed Structures: None N/A N/A Epithelialization: Treatment Notes Electronic Signature(s) Signed: 02/26/2023 11:29:00 AM By: [name] [name] Entered By: [name] [name] on 02/22/2023 11:27:09 -------------------------------------------------------------------------------- Multi-Disciplinary Care Plan Details Patient Name: Date of Service: [name] Montane. 02/22/2023 10:45 A M Medical Record Number: [id number] Patient Account Number: [id number] Date of Birth/Sex: Treating  RN: [birth year] (89 y.o. [name] [name] Primary Care Estil Vallee: [name] [name] Other Clinician: Betha [name] Referring Steel Kerney: Treating Rosaisela Jamroz/Extender: [name] [name] N, [name] [name] Guardian in Treatment: 14 Active Inactive Pressure Nursing Diagnoses: Knowledge deficit related to causes and risk factors for pressure ulcer development Potential for impaired tissue integrity related to pressure, friction, moisture, and shear Goals: Patient will remain free from development of additional pressure ulcers Date Initiated: 11/10/2022 Date Inactivated: 02/06/2023 Target Resolution Date: 02/04/2023 Goal Status: Met Patient will remain free of pressure ulcers Date Initiated: 11/10/2022 Target Resolution Date: 02/28/2023 Goal Status: Active Patient/caregiver will verbalize risk factors for pressure ulcer development Date Initiated: 11/10/2022 Date Inactivated: 11/10/2022 Target Resolution Date: 11/10/2022 Goal Status: Met Patient/caregiver will verbalize understanding of pressure ulcer management Date Initiated: 11/10/2022 Date Inactivated: 11/10/2022 Target Resolution Date: 11/10/2022 Goal Status: Met Interventions: Assess: immobility, friction, shearing, incontinence upon admission and as needed Assess offloading mechanisms upon admission and as needed Assess potential for pressure ulcer upon admission and as needed Provide education on pressure ulcers Notes: Wound/Skin Impairment Nursing Diagnoses: Impaired tissue integrity Knowledge deficit related to ulceration/compromised skin integrity Goals: Ulcer/skin breakdown will have a volume reduction of 30% by week 4 Date Initiated: 11/10/2022 Date Inactivated: 12/12/2022 Target Resolution Date: 12/08/2022 Goal Status: Met Ulcer/skin breakdown will have a volume reduction of 50% by week 8 [name] [name], [name] [name] ([id number]) [id number] Page 6 of 9 Date Initiated: 11/10/2022 Date Inactivated: 01/16/2023 Target  Resolution Date: 01/05/2023 Goal Status: Met Ulcer/skin breakdown will have a volume reduction of 80% by week 12 Date Initiated: 11/10/2022 Date Inactivated: 02/06/2023 Target Resolution Date: 02/02/2023 Goal Status: Met Ulcer/skin breakdown will heal within 14 weeks Date Initiated: 11/10/2022 Target Resolution Date: 02/16/2023 Goal Status: Active Interventions: Assess patient/caregiver ability to obtain necessary supplies Assess patient/caregiver ability to perform ulcer/skin care regimen upon admission and as needed Assess ulceration(s) every visit Provide education on ulcer and skin care Notes: Electronic Signature(s) Signed: 02/22/2023 4:09:49 PM By: Angelina Pih Signed: 02/26/2023 11:29:00 AM By: [name] [name] Entered By: [name] [name] on 02/22/2023 11:49:14 -------------------------------------------------------------------------------- Pain Assessment Details Patient Name: Date of Service: [name] Montane. 02/22/2023 10:45 A M Medical Record Number: [id number] Patient Account Number: [id number] Date of Birth/Sex: Treating RN: 12-06-1932 (89 y.o. [name] [name] Primary Care Lowry Bala: [name] [name] Other Clinician: Betha [name] Referring Merinda Victorino: Treating Lajoyce Tamura/Extender: [name] [name] N, [name] [name] Guardian in Treatment: 14 Active Problems Location of Pain Severity and Description of Pain Patient Has  Paino No Site Locations Pain Management and Medication Current Pain Management: Electronic Signature(s) Signed: 02/22/2023 4:09:49 PM By: Angelina Pih Signed: 02/26/2023 11:29:00 AM By: Blenda Mounts, Derenda Fennel ([id number]) AM By: Jeanette Caprice.pdf Page 7 of 9 Signed: 02/26/2023 11:29:00 Entered By: [name] [name] on 02/22/2023 11:17:09 -------------------------------------------------------------------------------- Patient/Caregiver Education Details Patient Name: Date of Service: [name] [name], [name] [name]  12/26/2024andnbsp10:45 A M Medical Record Number: [id number] Patient Account Number: [id number] Date of Birth/Gender: Treating RN: 07-19-1932 (89 y.o. [name] [name] Primary Care Physician: [name] [name] Other Clinician: Betha [name] Referring Physician: Treating Physician/Extender: [name] [name] [name] [name], [name] [name] Guardian in Treatment: 14 Education Assessment Education Provided To: Patient Education Topics Provided Wound/Skin Impairment: Handouts: Other: continue wound care as directed Methods: Explain/Verbal Responses: State content correctly Electronic Signature(s) Signed: 02/26/2023 11:29:00 AM By: [name] [name] Entered By: [name] [name] on 02/22/2023 11:49:34 -------------------------------------------------------------------------------- Wound Assessment Details Patient Name: Date of Service: [name] [name], [name] [name] 02/22/2023 10:45 A M Medical Record Number: [id number] Patient Account Number: [id number] Date of Birth/Sex: Treating RN: June 12, 1932 (89 y.o. [name] [name] Primary Care Ladawna Walgren: [name] [name] Other Clinician: Betha [name] Referring Janette Harvie: Treating Philipp Callegari/Extender: [name] [name] N, [name] [name] Guardian in Treatment: 14 Wound Status Wound Number: 1 Primary Abscess Etiology: Wound Location: Right, Medial Gluteus Wound Open Wounding Event: Gradually Appeared Status: Date Acquired: 09/08/2022 Comorbid Cataracts, Anemia, Asthma, Congestive Heart Failure, Weeks Of Treatment: 14 History: Hypertension, Type II Diabetes, Osteoarthritis Clustered Wound: No Photos [name] [name], [name] [name] ([id number]) [id number] Page 8 of 9 Wound Measurements Length: (cm) 1.5 Width: (cm) 2 Depth: (cm) 0.1 Area: (cm) 2.356 Volume: (cm) 0.236 % Reduction in Area: 91.8% % Reduction in Volume: 99.6% Epithelialization: None Tunneling: Yes Position (o'clock): 1 Maximum Distance: (cm) 2.4 Wound Description Classification: Full Thickness  Without Exposed Suppor Exudate Amount: Large Exudate Type: Serosanguineous Exudate Color: red, brown t Structures Foul Odor After Cleansing: No Slough/Fibrino Yes Wound Bed Granulation Amount: Large (67-100%) Exposed Structure Granulation Quality: Red Fat Layer (Subcutaneous Tissue) Exposed: Yes Necrotic Amount: None Present (0%) Treatment Notes Wound #1 (Gluteus) Wound Laterality: Right, Medial Cleanser Vashe 5.8 (oz) Discharge Instruction: Use vashe 5.8 (oz) as directed Peri-Wound Care Topical Primary Dressing Iodoform 1/4x 5 (in/yd) Discharge Instruction: Apply Iodoform Packing Strip as instructed. Silvercel Small 2x2 (in/in) Discharge Instruction: Apply Silvercel Small 2x2 (in/in) as instructed Secondary Dressing (BORDER) Zetuvit Plus SILICONE BORDER Dressing 4x4 (in/in) Discharge Instruction: Please do not put silicone bordered dressings under wraps. Use non-bordered dressing only. Secured With Compression Wrap Compression Stockings Add-Ons Electronic Signature(s) Signed: 02/22/2023 4:09:49 PM By: Angelina Pih Signed: 02/26/2023 11:29:00 AM By: [name] [name] Entered By: [name] [name] on 02/22/2023 11:45:38 Mikael Spray ([id number]) [id number] Page 9 of 9 -------------------------------------------------------------------------------- Vitals Details Patient Name: Date of Service: [name] [name], [name] [name] 02/22/2023 10:45 A M Medical Record Number: [id number] Patient Account Number: [id number] Date of Birth/Sex: Treating RN: Feb 04, 1933 (89 y.o. [name] [name] Primary Care Hopelynn Gartland: [name] [name] Other Clinician: Betha [name] Referring Willo Yoon: Treating Mukund Weinreb/Extender: [name] [name] N, [name] [name] Guardian in Treatment: 14 Vital Signs Time Taken: 11:15 Temperature (F): 97.5 Height (in): 66 Pulse (bpm): 72 Weight (lbs): 191 Respiratory Rate (breaths/min): 18 Body Mass Index (BMI): 30.8 Blood Pressure (mmHg):  166/79 Reference Range: 80 - 120 mg / dl Electronic Signature(s) Signed: 02/26/2023 11:29:00 AM By: [name] [name] Entered By: [name] [name] on 02/22/2023 11:17:05

## 2023-03-06 ENCOUNTER — Encounter: Payer: Medicare Other | Attending: Physician Assistant | Admitting: Physician Assistant

## 2023-03-06 DIAGNOSIS — S81811A Laceration without foreign body, right lower leg, initial encounter: Secondary | ICD-10-CM | POA: Diagnosis not present

## 2023-03-06 DIAGNOSIS — I130 Hypertensive heart and chronic kidney disease with heart failure and stage 1 through stage 4 chronic kidney disease, or unspecified chronic kidney disease: Secondary | ICD-10-CM | POA: Insufficient documentation

## 2023-03-06 DIAGNOSIS — E669 Obesity, unspecified: Secondary | ICD-10-CM | POA: Diagnosis not present

## 2023-03-06 DIAGNOSIS — T8131XA Disruption of external operation (surgical) wound, not elsewhere classified, initial encounter: Secondary | ICD-10-CM | POA: Insufficient documentation

## 2023-03-06 DIAGNOSIS — N183 Chronic kidney disease, stage 3 unspecified: Secondary | ICD-10-CM | POA: Insufficient documentation

## 2023-03-06 DIAGNOSIS — I5042 Chronic combined systolic (congestive) and diastolic (congestive) heart failure: Secondary | ICD-10-CM | POA: Diagnosis not present

## 2023-03-06 DIAGNOSIS — L0231 Cutaneous abscess of buttock: Secondary | ICD-10-CM | POA: Diagnosis not present

## 2023-03-06 DIAGNOSIS — L98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: Secondary | ICD-10-CM | POA: Insufficient documentation

## 2023-03-06 DIAGNOSIS — M4628 Osteomyelitis of vertebra, sacral and sacrococcygeal region: Secondary | ICD-10-CM | POA: Diagnosis not present

## 2023-03-06 DIAGNOSIS — Z8673 Personal history of transient ischemic attack (TIA), and cerebral infarction without residual deficits: Secondary | ICD-10-CM | POA: Insufficient documentation

## 2023-03-06 DIAGNOSIS — E1122 Type 2 diabetes mellitus with diabetic chronic kidney disease: Secondary | ICD-10-CM | POA: Diagnosis not present

## 2023-03-06 DIAGNOSIS — E11622 Type 2 diabetes mellitus with other skin ulcer: Secondary | ICD-10-CM | POA: Insufficient documentation

## 2023-03-06 DIAGNOSIS — Z6830 Body mass index (BMI) 30.0-30.9, adult: Secondary | ICD-10-CM | POA: Diagnosis not present

## 2023-03-06 DIAGNOSIS — X58XXXA Exposure to other specified factors, initial encounter: Secondary | ICD-10-CM | POA: Insufficient documentation

## 2023-03-06 DIAGNOSIS — E1169 Type 2 diabetes mellitus with other specified complication: Secondary | ICD-10-CM | POA: Insufficient documentation

## 2023-03-06 NOTE — Progress Notes (Addendum)
 [name], [name] ([id number]) [id number] Page 1 of 7 Visit Report for 03/06/2023 Chief Complaint Document Details Patient Name: Date of Service: [name] [name], [name] [name] 03/06/2023 3:15 PM Medical Record Number: [id number] Patient Account Number: [id number] Date of Birth/Sex: Treating RN: 01-05-1933 (89 y.o. [name] [name] Primary Care Provider: Pura Lenis Other Clinician: Referring Provider: Treating Provider/Extender: Bethena Andre Pura Lenis Devra in Treatment: 16 Information Obtained from: Patient Chief Complaint Bilateral Gluteal Ulcers and right leg skin tear Electronic Signature(s) Signed: 03/06/2023 3:24:31 PM By: Bethena Andre PA-C Entered By: Bethena Andre on 03/06/2023 12:24:31 -------------------------------------------------------------------------------- HPI Details Patient Name: Date of Service: [name] [name]. 03/06/2023 3:15 PM Medical Record Number: [id number] Patient Account Number: [id number] Date of Birth/Sex: Treating RN: [birth year] (89 y.o. [name] [name] Primary Care Provider: Pura Lenis Other Clinician: Referring Provider: Treating Provider/Extender: Bethena Andre Pura Lenis Devra in Treatment: 16 History of Present Illness HPI Description: 11-10-2022 upon evaluation today patient presents for initial inspection here in our clinic concerning a wound which occurred unfortunately as a result of an abscess. This was the main right gluteal/sacral region. With that being said she noted this first around or just before July 12 when she was admitted to the hospital from the 12th through the 26. The wound was at that point drained and subsequently she thought that she was headed in the right direction. With that being said she ended up having to be admitted to the hospital August and this was due to a CHF exacerbation with a took 25 pounds of fluid off of her. Subsequently she was then rehab for the wound at [facility] burn and the physician  there apparently did an awesome job based on what I am seeing the wound actually looks like is doing quite well at this point. They were doing a fairly complex VAC with the small openings in multiple areas that were being backed as well at this point the good news is I do not think that he would need to be necessary based on what I am seeing. She does have home health coming out to do the wound VAC changes Monday, Wednesday, and Friday although get this scheduled going has been somewhat a concern due to the fact that the patient is been in and out of the hospital. Patient does have a history of having had a stroke in 2001 with some left-sided weakness. She also on September 08, 2022 had her A1c checked this was 9.8. She is a diabetic. Other than these 2 conditions she also has chronic kidney disease stage III, hypertension, and again obviously the wounds which were can be helping to manage at this point. 11-28-2022 upon evaluation today patient appears to be doing well currently in regard to her wound in the sacral area. Most of the surrounding wounds are actually closed and they look excellent. I am actually very pleased with where we stand. The only downside is she does have a skin tear which is still questionable whether it is going to resolved with a skin flap intact or if it is going skin flap did great and breakdown we will know more as it goes over the next [name] [name] ([id number]) [phone].pdf Page 2 of 7 couple of weeks. Nonetheless right now this actually does not look too bad definitely no signs of infection this is on the right lower leg. Otherwise the patient seems to be doing extremely well with regard to the wound VAC I think that this is doing an awesome job  I think putting some collagen down to the tunnel would be beneficial I think the black foam is still better than the white foam to be honest. 12-12-2022 upon evaluation today patient appears to be doing  pretty well currently in regard to her wound. She has been tolerating the wound VAC without complication and seems to be making good progress here. Fortunately I do not see any signs of infection locally or systemically which is great news and in general I do believe that we are making excellent headway towards closure which is great news as well. No fevers, chills, nausea, vomiting, or diarrhea. 12-26-2022 upon evaluation today patient's wound does appear to be doing much better. There does not appear to be any signs of active infection which is great news and in general I think that her making excellent headway towards closure which is great news. No fevers, chills, nausea, vomiting, or diarrhea. 01-16-2023 upon evaluation today patient appears to be doing well currently in regard to her wounds the leg is healed and the gluteal region is significantly improved. I really do not think there is much that a wound VAC can continue to help with at this point. I discussed that with the patient today. 02-06-2023 upon evaluation today patient appears to be doing well currently in regard to her wound. She has been tolerating the dressing changes without complication. With that being said the wound does show some hypergranulation we will continue to try to work on this to some degree here. With that being said I do believe the Hydrofera Blue is a good option here but at the same time I think she still has some signs that she may have infection at this point as well. I will try to see what we can do about improving that overall. 02-15-2023 upon evaluation today patient appears to be doing about the same in regard to her wound she still has quite a bit of drainage I do not feel any bone in the bottom of the wound but she does have quite a bit of drainage that makes us  concerned about infection. We can actually go ahead and check for infection by way of the PCR culture at this point. 12/26; patient has a surgical  wound in the gluteal cleft on the right side. This is come in nicely and continues to contract nicely. She still has 1 probing area that goes precariously close to the bone. Still a fair amount of drainage reported. PCR culture from 02/15/2023 grew Bacteroides fragilis and Fusobacterium and Finegoldia. The latter 2 I do not think are usual pathogens. The patient is now on metronidazole  It sounds as though she still spends a fair amount of time sitting up in the wheelchair at home. 03-06-23 upon evaluation patient appears to actually be doing better in regard to this deeper tunnel as well as the amount of drainage she is having which is great news. Fortunately I do not see any signs of worsening which is also excellent news. No fevers, chills, nausea, vomiting, or diarrhea. Electronic Signature(s) Signed: 03/06/2023 5:19:59 PM By: Bethena Ferraris PA-C Entered By: Bethena Ferraris on 03/06/2023 14:19:59 -------------------------------------------------------------------------------- Physical Exam Details Patient Name: Date of Service: ALURA, OLVEDA 03/06/2023 3:15 PM Medical Record Number: [id number] Patient Account Number: [id number] Date of Birth/Sex: Treating RN: 10-27-1932 (89 y.o. [name] [name] Primary Care Provider: Pura Lenis Other Clinician: Referring Provider: Treating Provider/Extender: Bethena Ferraris Pura Lenis Devra in Treatment: 16 Constitutional Well-nourished and well-hydrated in no acute distress. Respiratory  normal breathing without difficulty. Psychiatric this patient is able to make decisions and demonstrates good insight into disease process. Alert and Oriented x 3. pleasant and cooperative. Notes Upon inspection patient's wound bed actually showed signs of good granulation epithelization at this point. Fortunately I do not see any signs of infection at this time which is awesome news. Electronic Signature(s) Signed: 03/06/2023 5:20:10 PM By: Bethena Ferraris PA-C Entered By:  Bethena Ferraris on 03/06/2023 14:20:10 [name] HARVEST [name] ([id number]) [id number] Page 3 of 7 -------------------------------------------------------------------------------- Physician Orders Details Patient Name: Date of Service: [name] [name], [name] [name] 03/06/2023 3:15 PM Medical Record Number: [id number] Patient Account Number: [id number] Date of Birth/Sex: Treating RN: 01-14-1933 (89 y.o. 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 Home Health Home Health Company: - [facility] for wound care. May utilize formulary equivalent dressing for wound treatment orders unless otherwise specified. Home Health Nurse may visit PRN to address patients wound care needs. Scheduled days for dressing changes to be completed; exception, patient has scheduled wound care visit that day. **Please direct any NON-WOUND related issues/requests for orders to patient's Primary Care Physician. **If current dressing causes regression in wound condition, may D/C ordered dressing  product/s and apply Normal Saline Moist Dressing daily until next Wound Healing Center or Other MD appointment. **Notify Wound Healing Center of regression in wound condition at [phone]. Bathing/ Shower/ Hygiene No tub bath. Anesthetic (Use 'Patient Medications' Section for Anesthetic Order Entry) Lidocaine  applied to wound bed Off-Loading Turn and reposition every 2 hours Other: - stay off bottom as much as possible Negative Pressure Wound Therapy Discontinue NPWT. Wound Treatment Wound #1 - Gluteus Wound Laterality: Right, Medial Cleanser: Vashe 5.8 (oz) 1 x Per Day/15 Days Discharge Instructions: Use vashe 5.8 (oz) as directed Prim Dressing: Iodoform 1/4x 5 (in/yd) 1 x Per Day/15 Days ary Discharge Instructions: Apply Iodoform Packing Strip as instructed. Prim Dressing: Silvercel Small 2x2 (in/in) 1 x Per Day/15 Days ary Discharge Instructions: Apply Silvercel Small 2x2 (in/in) as instructed Secondary Dressing: (BORDER) Zetuvit Plus SILICONE BORDER Dressing 4x4 (in/in) 1 x Per Day/15 Days Discharge Instructions: Please do not put silicone bordered dressings under wraps. Use non-bordered dressing only. [name] [name], [name] [name] ([id number]) [id number] Page 4 of 7 Electronic Signature(s) Signed: 03/06/2023 4:00:09 PM By: Vaughn [name] Signed: 03/07/2023 6:16:28 PM By: Bethena Ferraris PA-C Entered By: Vaughn [name] on 03/06/2023 12:36:21 -------------------------------------------------------------------------------- Problem List Details Patient Name: Date of Service: [name] [name], [name] [name] 03/06/2023 3:15 PM Medical Record Number: [id number] Patient Account Number: [id number] Date of Birth/Sex: Treating RN: 1932-08-08 (89 y.o. 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 without 11/10/2022 No Yes evidence of necrosis E11.622 Type 2 diabetes mellitus with other skin ulcer 11/10/2022 No Yes I10 Essential (primary) hypertension 11/10/2022 No Yes I50.42 Chronic combined systolic (congestive) and diastolic (congestive) heart failure 11/10/2022 No Yes N18.30 Chronic kidney disease, stage 3 unspecified 11/10/2022 No Yes S81.811A Laceration without foreign body, right lower leg, initial encounter 11/28/2022 No Yes Inactive Problems Resolved Problems SKYLA, CHAMPAGNE ([id number]) [phone].pdf Page 5 of 7 Electronic Signature(s) Signed: 03/06/2023 3:24:27 PM By: Bethena Ferraris PA-C Entered By: Bethena Ferraris on 03/06/2023 12:24:27 -------------------------------------------------------------------------------- Progress Note Details Patient Name: Date of Service: [name] [name], [name] [name] 03/06/2023 3:15 PM Medical Record Number: [id number] Patient Account Number: [id number] Date of Birth/Sex: Treating RN: 04/22/1932 (89 y.o. [name] [name] Primary Care Provider: Pura Lenis Other Clinician: Referring Provider: Treating Provider/Extender: Bethena Ferraris Pura Lenis Devra in Treatment: 16 Subjective Chief Complaint Information obtained from Patient Bilateral Gluteal Ulcers and right leg skin tear History of Present Illness (HPI) 11-10-2022 upon evaluation today patient presents for initial inspection here in our clinic concerning a wound which occurred unfortunately as a result of an abscess. This was the main right gluteal/sacral region. With that being said she noted this first around or just before July 12 when she was admitted to the hospital from the 12th through the 26. The wound was at that  point drained and subsequently she thought that she was headed in the right direction. With that being said she ended up having to be admitted to the hospital August and this was due to a CHF exacerbation with a took 25 pounds of fluid off of her. Subsequently she was then rehab for the wound at [facility] burn and the physician there apparently did an awesome job based on what I am seeing the wound actually looks like is doing quite well at this point. They were doing a fairly complex VAC with the small openings in multiple areas that were being backed as well at this point the good news is I do not think that he would need to be necessary based on what I am seeing. She does have home health coming out to do the wound VAC changes Monday, Wednesday, and Friday although get this scheduled going has been somewhat a concern due to the fact that the patient is been in and out of the hospital. Patient does have a history of having had a stroke in 2001 with some left-sided weakness. She also on September 08, 2022 had her A1c checked this was 9.8. She is a diabetic. Other than these 2 conditions she also has chronic kidney disease stage III, hypertension, and again obviously the wounds which were can be helping to manage at this point. 11-28-2022 upon evaluation today patient appears to be doing well currently in regard to her wound in the sacral area. Most of the surrounding wounds are actually closed and they look excellent. I am actually very pleased with where we stand. The only downside is she does have a skin tear which is still questionable whether it is going to resolved with a skin flap intact or if it is going skin flap did great and breakdown we will know more as it goes over the next couple of weeks. Nonetheless right now this actually does not look too bad definitely no signs of infection this is on the right lower leg. Otherwise the patient  seems to be doing extremely well with regard to the wound VAC I  think that this is doing an awesome job I think putting some collagen down to the tunnel would be beneficial I think the black foam is still better than the white foam to be honest. 12-12-2022 upon evaluation today patient appears to be doing pretty well currently in regard to her wound. She has been tolerating the wound VAC without complication and seems to be making good progress here. Fortunately I do not see any signs of infection locally or systemically which is great news and in general I do believe that we are making excellent headway towards closure which is great news as well. No fevers, chills, nausea, vomiting, or diarrhea. 12-26-2022 upon evaluation today patient's wound does appear to be doing much better. There does not appear to be any signs of active infection which is great news and in general I think that her making excellent headway towards closure which is great news. No fevers, chills, nausea, vomiting, or diarrhea. 01-16-2023 upon evaluation today patient appears to be doing well currently in regard to her wounds the leg is healed and the gluteal region is significantly improved. I really do not think there is much that a wound VAC can continue to help with at this point. I discussed that with the patient today. 02-06-2023 upon evaluation today patient appears to be doing well currently in regard to her wound. She has been tolerating the dressing changes without complication. With that being said the wound does show some hypergranulation we will continue to try to work on this to some degree here. With that being said I do believe the Hydrofera Blue is a good option here but at the same time I think she still has some signs that she may have infection at this point as well. I will try to see what we can do about improving that overall. 02-15-2023 upon evaluation today patient appears to be doing about the same in regard to her wound she still has quite a bit of drainage I do not  feel any bone in the bottom of the wound but she does have quite a bit of drainage that makes us  concerned about infection. We can actually go ahead and check for infection by way of the PCR culture at this point. 12/26; patient has a surgical wound in the gluteal cleft on the right side. This is come in nicely and continues to contract nicely. She still has 1 probing area that goes precariously close to the bone. Still a fair amount of drainage reported. PCR culture from 02/15/2023 grew Bacteroides fragilis and Fusobacterium and Finegoldia. The latter 2 I do not think are usual pathogens. The patient is now on metronidazole  It sounds as though she still spends a fair amount of time sitting up in the wheelchair at home. 03-06-23 upon evaluation patient appears to actually be doing better in regard to this deeper tunnel as well as the amount of drainage she is having which is great news. Fortunately I do not see any signs of worsening which is also excellent news. No fevers, chills, nausea, vomiting, or diarrhea. [name] [name], [name] [name] ([id number]) [id number] Page 6 of 7 Objective Constitutional Well-nourished and well-hydrated in no acute distress. Vitals Time Taken: 3:10 PM, Height: 66 in, Weight: 191 lbs, BMI: 30.8, Temperature: 97.9 F, Pulse: 78 bpm, Respiratory Rate: 17 breaths/min, Blood Pressure: 153/73 mmHg. Respiratory normal breathing without difficulty. Psychiatric this patient is able to make decisions and  demonstrates good insight into disease process. Alert and Oriented x 3. pleasant and cooperative. General Notes: Upon inspection patient's wound bed actually showed signs of good granulation epithelization at this point. Fortunately I do not see any signs of infection at this time which is awesome news. Integumentary (Hair, Skin) Wound #1 status is Open. Original cause of wound was Gradually Appeared. The date acquired was: 09/08/2022. The wound has been in  treatment 16 weeks. The wound is located on the Right,Medial Gluteus. The wound measures 1.5cm length x 2cm width x 0.1cm depth; 2.356cm^2 area and 0.236cm^3 volume. There is Fat Layer (Subcutaneous Tissue) exposed. There is tunneling at 1:00 with a maximum distance of 1.5cm. There is a large amount of serosanguineous drainage noted. There is large (67-100%) red granulation within the wound bed. There is no necrotic tissue within the wound bed. 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 Home Health: Home Health Company: - [facility] for wound care. May utilize formulary equivalent dressing for wound treatment orders unless otherwise specified. Home Health Nurse may visit PRN to address patients wound care needs. Scheduled days for dressing changes to be completed; exception, patient has scheduled wound care visit that day. **Please direct any NON-WOUND related issues/requests for orders to patient's Primary Care Physician. **If current dressing causes regression in wound condition, may D/C ordered dressing product/s and apply Normal Saline Moist Dressing daily until next Wound Healing Center or Other MD appointment. **Notify Wound Healing Center of regression in wound condition at [phone]. Bathing/ Shower/ Hygiene: No tub bath. Anesthetic (Use 'Patient Medications' Section for Anesthetic Order Entry): Lidocaine  applied to wound  bed Off-Loading: Turn and reposition every 2 hours Other: - stay off bottom as much as possible Negative Pressure Wound Therapy: Discontinue NPWT . WOUND #1: - Gluteus Wound Laterality: Right, Medial Cleanser: Vashe 5.8 (oz) 1 x Per Day/15 Days Discharge Instructions: Use vashe 5.8 (oz) as directed Prim Dressing: Iodoform 1/4x 5 (in/yd) 1 x Per Day/15 Days ary Discharge Instructions: Apply Iodoform Packing Strip as instructed. Prim Dressing: Silvercel Small 2x2 (in/in) 1 x Per Day/15 Days ary Discharge Instructions: Apply Silvercel Small 2x2 (in/in) as instructed Secondary Dressing: (BORDER) Zetuvit Plus SILICONE BORDER Dressing 4x4 (in/in) 1 x Per Day/15 Days Discharge Instructions: Please do not put silicone bordered dressings under wraps. Use non-bordered dressing only. [name] [name], [name] [name] ([id number]) [id number] Page 7 of 7 1. I would recommend that the patient should continue to monitor for any signs of infection or worsening. Based on what I am seeing I believe that we are making excellent headway here towards closure. 2. I am also can recommend the patient should continue to offload is much as possible obviously the more of this she can do the better and I am very pleased with how things seem to be progressing at this point. We will see patient back for reevaluation in 1 week here in the clinic. If anything worsens or changes patient will contact our office for additional recommendations. Electronic Signature(s) Signed: 03/06/2023 5:20:39 PM By: Bethena Ferraris PA-C Entered By: Bethena Ferraris on 03/06/2023 14:20:39 -------------------------------------------------------------------------------- SuperBill Details Patient  Name: Date of Service: [name] [name], [name] [name] 03/06/2023 Medical Record Number: [id number] Patient Account Number: [id number] Date of Birth/Sex: Treating RN: Jul 16, 1932 (89 y.o. 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 E11.622 Type 2 diabetes mellitus with other skin ulcer I10 Essential (primary) hypertension I50.42 Chronic combined systolic (congestive) and diastolic (congestive) heart failure N18.30 Chronic kidney disease, stage 3 unspecified S81.811A Laceration without foreign body, right lower leg, initial encounter Facility Procedures : CPT4 Code: [id number] Description: 99213 - WOUND CARE VISIT-LEV 3 EST PT Modifier: Quantity: 1 Physician Procedures : CPT4 Code Description Modifier [id number] - WC PHYS LEVEL 3 - EST PT ICD-10 Diagnosis Description T81.31XA Disruption of external operation (surgical) wound, not elsewhere classified, initial encounter L02.31 Cutaneous abscess of buttock 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

## 2023-03-08 NOTE — Progress Notes (Signed)
 [name], [name] ([id number]) [id number] Page 1 of 10 Visit Report for 03/06/2023 Arrival Information Details Patient Name: Date of Service: [name] [name], [name] [name] 03/06/2023 3:15 PM Medical Record Number: [id number] Patient Account Number: [id number] Date of Birth/Sex: Treating RN: 1933/01/08 (89 y.o. [name] [name] Primary Care Amethyst Gainer: Pura Lenis Other Clinician: Referring Voshon Petro: Treating Burnett Spray/Extender: Bethena Andre Pura Lenis Devra in Treatment: 16 Visit Information History Since Last Visit Added or deleted any medications: No Patient Arrived: Wheel Chair Any new allergies or adverse reactions: No Arrival Time: 15:03 Had a fall or experienced change in No Accompanied By: family activities of daily living that may affect Transfer Assistance: Manual risk of falls: Patient Identification Verified: Yes Signs or symptoms of abuse/neglect since last visito No Secondary Verification Process Completed: Yes Hospitalized since last visit: No Patient Requires Transmission-Based No Implantable device outside of the clinic excluding No Precautions: cellular tissue based products placed in the center Patient Has Alerts: Yes since last visit: Patient Alerts: Patient on Blood Thinner Pain Present Now: No type 2 diabetic Stroke 2021, R sided weak Electronic Signature(s) Signed: 03/06/2023 4:00:09 PM By: Vaughn [name] Entered By: Vaughn [name] on 03/06/2023 12:11:52 -------------------------------------------------------------------------------- Clinic Level of Care Assessment Details Patient Name: Date of Service: [name], [name] 03/06/2023 3:15 PM Medical Record Number: [id number] Patient Account Number: [id number] Date of Birth/Sex: Treating RN: Aug 18, 1932 (89 y.o. [name] [name] Primary Care Monnie Anspach: Pura Lenis Other Clinician: Referring Harry Bark: Treating Dhruti Ghuman/Extender: Bethena Andre Pura Lenis Devra in Treatment: 16 Clinic  Level of Care Assessment Items TOOL 4 Quantity Score []  - 0 Use when only an EandM is performed on FOLLOW-UP visit ASSESSMENTS - Nursing Assessment / Reassessment X- 1 10 Reassessment of Co-morbidities (includes updates in patient status) X- 1 5 Reassessment of Adherence to Treatment Plan [name] [name], [name] [name] ([id number]) [phone].pdf Page 2 of 10 ASSESSMENTS - Wound and Skin A ssessment / Reassessment X - Simple Wound Assessment / Reassessment - one wound 1 5 []  - 0 Complex Wound Assessment / Reassessment - multiple wounds []  - 0 Dermatologic / Skin Assessment (not related to wound area) ASSESSMENTS - Focused Assessment []  - 0 Circumferential Edema Measurements - multi extremities []  - 0 Nutritional Assessment / Counseling / Intervention []  - 0 Lower Extremity Assessment (monofilament, tuning fork, pulses) []  - 0 Peripheral Arterial Disease Assessment (using hand held doppler) ASSESSMENTS - Ostomy and/or Continence Assessment and Care []  - 0 Incontinence Assessment and Management []  - 0 Ostomy Care Assessment and Management (repouching, etc.) PROCESS - Coordination of Care X - Simple Patient / Family Education for ongoing care 1 15 []  - 0 Complex (extensive) Patient / Family Education for ongoing care X- 1 10 Staff obtains Chiropractor, Records, T Results / Process Orders est []  - 0 Staff telephones HHA, Nursing Homes / Clarify orders / etc []  - 0 Routine Transfer to another Facility (non-emergent condition) []  - 0 Routine Hospital Admission (non-emergent condition) []  - 0 New Admissions / Manufacturing Engineer / Ordering NPWT Apligraf, etc. , []  - 0 Emergency Hospital Admission (emergent condition) X- 1 10 Simple Discharge Coordination []  - 0 Complex (extensive) Discharge Coordination PROCESS - Special Needs []  - 0 Pediatric / Minor Patient Management []  - 0 Isolation Patient Management []  - 0 Hearing / Language / Visual special  needs []  - 0 Assessment of Community assistance (transportation, D/C planning, etc.) []  - 0 Additional assistance / Altered mentation []  - 0 Support Surface(s) Assessment (bed, cushion, seat, etc.) INTERVENTIONS - Wound Cleansing /  Measurement X - Simple Wound Cleansing - one wound 1 5 []  - 0 Complex Wound Cleansing - multiple wounds X- 1 5 Wound Imaging (photographs - any number of wounds) []  - 0 Wound Tracing (instead of photographs) X- 1 5 Simple Wound Measurement - one wound []  - 0 Complex Wound Measurement - multiple wounds INTERVENTIONS - Wound Dressings X - Small Wound Dressing one or multiple wounds 1 10 []  - 0 Medium Wound Dressing one or multiple wounds []  - 0 Large Wound Dressing one or multiple wounds []  - 0 Application of Medications - topical []  - 0 Application of Medications - injection INTERVENTIONS - Miscellaneous []  - 0 External ear exam [name] [name], [name] [name] ([id number]) [phone].pdf Page 3 of 10 []  - 0 Specimen Collection (cultures, biopsies, blood, body fluids, etc.) []  - 0 Specimen(s) / Culture(s) sent or taken to Lab for analysis []  - 0 Patient Transfer (multiple staff / Deitra Lift / Similar devices) []  - 0 Simple Staple / Suture removal (25 or less) []  - 0 Complex Staple / Suture removal (26 or more) []  - 0 Hypo / Hyperglycemic Management (close monitor of Blood Glucose) []  - 0 Ankle / Brachial Index (ABI) - do not check if billed separately X- 1 5 Vital Signs Has the patient been seen at the hospital within the last three years: Yes Total Score: 85 Level Of Care: New/Established - Level 3 Electronic Signature(s) Signed: 03/06/2023 4:00:09 PM By: Vaughn [name] Entered By: Vaughn [name] on 03/06/2023 12:36:28 -------------------------------------------------------------------------------- Complex / Palliative Patient Assessment Details Patient Name: Date of Service: [name] [name], [name] [name] 03/06/2023 3:15 PM Medical  Record Number: [id number] Patient Account Number: [id number] Date of Birth/Sex: Treating RN: 12-17-1932 (89 y.o. [name] [name] Primary Care Jazz Biddy: Pura Lenis Other Clinician: Referring Dontavian Marchi: Treating Yannet Rincon/Extender: Bethena Andre Pura Lenis Devra in Treatment: 16 Complex Wound Management Criteria Patient has remarkable or complex co-morbidities requiring medications or treatments that extend wound healing times. Examples: Diabetes mellitus with chronic renal failure or end stage renal disease requiring dialysis Advanced or poorly controlled rheumatoid arthritis Diabetes mellitus and end stage chronic obstructive pulmonary disease Active cancer with current chemo- or radiation therapy HTN, CKD, STROKE, Palliative Wound Management Criteria Care Approach Wound Care Plan: Complex Wound Management Electronic Signature(s) Signed: 03/06/2023 4:00:09 PM By: Vaughn [name] Signed: 03/07/2023 6:16:28 PM By: Bethena Andre PA-C Entered By: Vaughn [name] on 03/06/2023 12:46:58 [name] HARVEST BROCKS ([id number]) [id number] Page 4 of 10 -------------------------------------------------------------------------------- Encounter Discharge Information Details Patient Name: Date of Service: [name] [name], [name] [name] 03/06/2023 3:15 PM Medical Record Number: [id number] Patient Account Number: [id number] Date of Birth/Sex: Treating RN: [birth year] (89 y.o. [name] [name] Primary Care Angeletta Goelz: Pura Lenis Other Clinician: Referring Arie Gable: Treating Shubh Chiara/Extender: Bethena Andre Pura Lenis Devra in Treatment: 16 Encounter Discharge Information Items Discharge Condition: Stable Ambulatory Status: Wheelchair Discharge Destination: Home Transportation: Private Auto Accompanied By: family Schedule Follow-up Appointment: Yes Clinical Summary of Care: Electronic Signature(s) Signed: 03/06/2023 4:00:09 PM By: Vaughn [name] Entered By: Vaughn [name] on 03/06/2023  12:45:49 -------------------------------------------------------------------------------- Lower Extremity Assessment Details Patient Name: Date of Service: [name] [name], [name] [name] 03/06/2023 3:15 PM Medical Record Number: [id number] Patient Account Number: [id number] Date of Birth/Sex: Treating RN: 14-Jan-1933 (89 y.o. [name] [name] Primary Care Magalie Almon: Pura Lenis Other Clinician: Referring Neelah Mannings: Treating Frimet Durfee/Extender: Bethena Andre Pura Lenis Devra in Treatment: 16 Electronic Signature(s) Signed: 03/06/2023 4:00:09 PM By: Vaughn [name] Entered By: Vaughn [name] on 03/06/2023 12:21:35 [name] HARVE[facility] BROCKS ([id number]) [id number] Page 5 of 10 -------------------------------------------------------------------------------- Multi  Wound Chart Details Patient Name: Date of Service: [name] [name], [name] [name] 03/06/2023 3:15 PM Medical Record Number: [id number] Patient Account Number: [id number] Date of Birth/Sex: Treating RN: 05-16-1932 (89 y.o. [name] [name] Primary Care Shirl Ludington: Pura Lenis Other Clinician: Referring Javione Gunawan: Treating Brandice Busser/Extender: Bethena Andre Pura Lenis Devra in Treatment: 16 Vital Signs Height(in): 66 Pulse(bpm): 78 Weight(lbs): 191 Blood Pressure(mmHg): 153/73 Body Mass Index(BMI): 30.8 Temperature(F): 97.9 Respiratory Rate(breaths/min): 17 [1:Photos:] [N/A:N/A] Right, Medial Gluteus N/A N/A Wound Location: Gradually Appeared N/A N/A Wounding Event: Abscess N/A N/A Primary Etiology: Cataracts, Anemia, Asthma, N/A N/A Comorbid History: Congestive Heart Failure, Hypertension, Type II Diabetes, Osteoarthritis 09/08/2022 N/A N/A Date Acquired: 16 N/A N/A Weeks of Treatment: Open N/A N/A Wound Status: No N/A N/A Wound Recurrence: 1.5x2x0.1 N/A N/A Measurements L x W x D (cm) 2.356 N/A N/A A (cm) : rea 0.236 N/A N/A Volume (cm) : 91.80% N/A N/A % Reduction in A rea: 99.60% N/A N/A % Reduction in  Volume: 1 Position 1 (o'clock): 1.5 Maximum Distance 1 (cm): Yes N/A N/A Tunneling: Full Thickness Without Exposed N/A N/A Classification: Support Structures Large N/A N/A Exudate A mount: Serosanguineous N/A N/A Exudate Type: red, brown N/A N/A Exudate Color: Large (67-100%) N/A N/A Granulation A mount: Red N/A N/A Granulation Quality: None Present (0%) N/A N/A Necrotic A mount: Fat Layer (Subcutaneous Tissue): Yes N/A N/A Exposed Structures: None N/A N/A Epithelialization: Treatment Notes Electronic Signature(s) Signed: 03/06/2023 4:00:09 PM By: Vaughn [name] Entered By: Vaughn [name] on 03/06/2023 12:32:51 [name] HARVE[facility] BROCKS ([id number]) [id number] Page 6 of 10 -------------------------------------------------------------------------------- Multi-Disciplinary Care Plan Details Patient Name: Date of Service: [name] [name], [name] [name] 03/06/2023 3:15 PM Medical Record Number: [id number] Patient Account Number: [id number] Date of Birth/Sex: Treating RN: 05-10-1932 (89 y.o. [name] [name] Primary Care Abu Heavin: Pura Lenis Other Clinician: Referring Jerlyn Pain: Treating Heydi Swango/Extender: Bethena Andre Pura Lenis Devra in Treatment: 16 Active Inactive Pressure Nursing Diagnoses: Knowledge deficit related to causes and risk factors for pressure ulcer development Potential for impaired tissue integrity related to pressure, friction, moisture, and shear Goals: Patient will remain free from development of additional pressure ulcers Date Initiated: 11/10/2022 Date Inactivated: 02/06/2023 Target Resolution Date: 02/04/2023 Goal Status: Met Patient will remain free of pressure ulcers Date Initiated: 11/10/2022 Target Resolution Date: 03/28/2023 Goal Status: Active Patient/caregiver will verbalize risk factors for pressure ulcer development Date Initiated: 11/10/2022 Date Inactivated: 11/10/2022 Target Resolution Date: 11/10/2022 Goal Status:  Met Patient/caregiver will verbalize understanding of pressure ulcer management Date Initiated: 11/10/2022 Date Inactivated: 11/10/2022 Target Resolution Date: 11/10/2022 Goal Status: Met Interventions: Assess: immobility, friction, shearing, incontinence upon admission and as needed Assess offloading mechanisms upon admission and as needed Assess potential for pressure ulcer upon admission and as needed Provide education on pressure ulcers Notes: Wound/Skin Impairment Nursing Diagnoses: Impaired tissue integrity Knowledge deficit related to ulceration/compromised skin integrity Goals: Ulcer/skin breakdown will have a volume reduction of 30% by week 4 Date Initiated: 11/10/2022 Date Inactivated: 12/12/2022 Target Resolution Date: 12/08/2022 Goal Status: Met Ulcer/skin breakdown will have a volume reduction of 50% by week 8 Date Initiated: 11/10/2022 Date Inactivated: 01/16/2023 Target Resolution Date: 01/05/2023 Goal Status: Met Ulcer/skin breakdown will have a volume reduction of 80% by week 12 Date Initiated: 11/10/2022 Date Inactivated: 02/06/2023 Target Resolution Date: 02/02/2023 Goal Status: Met Ulcer/skin breakdown will heal within 14 weeks Date Initiated: 11/10/2022 Target Resolution Date: 02/16/2023 Goal Status: Active Interventions: Assess patient/caregiver ability to obtain necessary supplies Assess patient/caregiver ability to perform ulcer/skin care regimen upon admission and as needed [place],  Jessicaann C ([id number]) [id number] Page 7 of 10 Assess ulceration(s) every visit Provide education on ulcer and skin care Notes: Electronic Signature(s) Signed: 03/06/2023 4:00:09 PM By: Vaughn [name] Entered By: Vaughn [name] on 03/06/2023 12:37:24 -------------------------------------------------------------------------------- Pain Assessment Details Patient Name: Date of Service: [name] [name], [name] [name] 03/06/2023 3:15 PM Medical Record Number:  [id number] Patient Account Number: [id number] Date of Birth/Sex: Treating RN: 01-27-1933 (89 y.o. [name] [name] Primary Care Teiara Baria: Pura Lenis Other Clinician: Referring Everleigh Colclasure: Treating Renne Cornick/Extender: Bethena Andre Pura Lenis Devra in Treatment: 16 Active Problems Location of Pain Severity and Description of Pain Patient Has Paino No Site Locations Rate the pain. Current Pain Level: 0 Pain Management and Medication Current Pain Management: Electronic Signature(s) Signed: 03/06/2023 4:00:09 PM By: Vaughn [name] Entered By: Vaughn [name] on 03/06/2023 12:12:34 [name] HARVE[facility] BROCKS ([id number]) [id number] Page 8 of 10 -------------------------------------------------------------------------------- Patient/Caregiver Education Details Patient Name: Date of Service: [name] [name], [name] [name] 1/7/2025andnbsp3:15 PM Medical Record Number: [id number] Patient Account Number: [id number] Date of Birth/Gender: Treating RN: 14-Jul-1932 (89 y.o. [name] [name] Primary Care Physician: Pura Lenis Other Clinician: Referring Physician: Treating Physician/Extender: Bethena Andre Pura Lenis Devra in Treatment: 16 Education Assessment Education Provided To: Patient Education Topics Provided Pressure: Handouts: Pressure Injury: Prevention and Offloading Methods: Explain/Verbal Responses: State content correctly Wound/Skin Impairment: Handouts: Caring for Your Ulcer Methods: Explain/Verbal Responses: State content correctly Electronic Signature(s) Signed: 03/06/2023 4:00:09 PM By: Vaughn [name] Entered By: Vaughn [name] on 03/06/2023 12:37:54 -------------------------------------------------------------------------------- Wound Assessment Details Patient Name: Date of Service: [name] [name], [name] [name] 03/06/2023 3:15 PM Medical Record Number: [id number] Patient Account Number: [id number] Date of Birth/Sex: Treating RN: October 29, 1932 (89 y.o. [name] [name] Primary Care Justinn Welter: Pura Lenis Other Clinician: Referring Harol Shabazz: Treating Cereniti Curb/Extender: Bethena Andre Pura Lenis Devra in Treatment: 16 Wound Status Wound Number: 1 Primary Abscess Etiology: Wound Location: Right, Medial Gluteus Wound Open Wounding Event: Gradually Appeared Status: Date Acquired: 09/08/2022 Comorbid Cataracts, Anemia, Asthma, Congestive Heart Failure, Weeks Of Treatment: 16 History: Hypertension, Type II Diabetes, Osteoarthritis Clustered Wound: No KENZEE, BASSIN ([id number]) [phone].pdf Page 9 of 10 Photos Wound Measurements Length: (cm) 1.5 Width: (cm) 2 Depth: (cm) 0.1 Area: (cm) 2.356 Volume: (cm) 0.236 % Reduction in Area: 91.8% % Reduction in Volume: 99.6% Epithelialization: None Tunneling: Yes Position (o'clock): 1 Maximum Distance: (cm) 1.5 Wound Description Classification: Full Thickness Without Exposed Suppor Exudate Amount: Large Exudate Type: Serosanguineous Exudate Color: red, brown t Structures Foul Odor After Cleansing: No Slough/Fibrino Yes Wound Bed Granulation Amount: Large (67-100%) Exposed Structure Granulation Quality: Red Fat Layer (Subcutaneous Tissue) Exposed: Yes Necrotic Amount: None Present (0%) Treatment Notes Wound #1 (Gluteus) Wound Laterality: Right, Medial Cleanser Vashe 5.8 (oz) Discharge Instruction: Use vashe 5.8 (oz) as directed Peri-Wound Care Topical Primary Dressing Iodoform 1/4x 5 (in/yd) Discharge Instruction: Apply Iodoform Packing Strip as instructed. Silvercel Small 2x2 (in/in) Discharge Instruction: Apply Silvercel Small 2x2 (in/in) as instructed Secondary Dressing (BORDER) Zetuvit Plus SILICONE BORDER Dressing 4x4 (in/in) Discharge Instruction: Please do not put silicone bordered dressings under wraps. Use non-bordered dressing only. Secured With Compression Wrap Compression Stockings Add-Ons Electronic Signature(s) Signed: 03/06/2023 4:00:09  PM By: Vaughn [name] Entered By: Gordon, Caitlin on 03/06/2023 12:21:25 [name] HARVE[facility]OCK[facility] ([id number]) [id number] Page 10 of 10 -------------------------------------------------------------------------------- Vitals Details Patient Name: Date of Service: SHERMAN, LIPUMA 03/06/2023 3:15 PM Medical Record Number: [id number] Patient Account Number: [id number] Date of Birth/Sex: Treating RN: 04/17/1932 (89 y.o. [name] [name] Primary Care Monterrio Gerst: Pura Lenis Other Clinician: Referring Burdell Peed: Treating Mare Ludtke/Extender: Bethena,  Andre Bilis, Alm Duos in Treatment: 16 Vital Signs Time Taken: 15:10 Temperature (F): 97.9 Height (in): 66 Pulse (bpm): 78 Weight (lbs): 191 Respiratory Rate (breaths/min): 17 Body Mass Index (BMI): 30.8 Blood Pressure (mmHg): 153/73 Reference Range: 80 - 120 mg / dl Electronic Signature(s) Signed: 03/06/2023 4:00:09 PM By: Vaughn [name] Entered By: Vaughn [name] on 03/06/2023 12:12:20

## 2023-03-12 ENCOUNTER — Other Ambulatory Visit (HOSPITAL_COMMUNITY): Payer: Self-pay | Admitting: Physician Assistant

## 2023-03-12 ENCOUNTER — Encounter: Payer: Medicare Other | Admitting: Physician Assistant

## 2023-03-12 DIAGNOSIS — L98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: Secondary | ICD-10-CM

## 2023-03-12 DIAGNOSIS — E11622 Type 2 diabetes mellitus with other skin ulcer: Secondary | ICD-10-CM | POA: Diagnosis not present

## 2023-03-14 NOTE — Progress Notes (Signed)
 ZULEY, LUTTER ([id number]) [id number] Page 1 of 6 Visit Report for 03/12/2023 Arrival Information Details Patient Name: Date of Service: [name] [name], [name] [name] 03/12/2023 12:00 PM Medical Record Number: [id number] Patient Account Number: [id number] Date of Birth/Sex: Treating RN: 04-11-1932 (89 y.o. [name] Primary Care Leif Loflin: Pura Lenis Other Clinician: Referring Mickey Esguerra: Treating Easter Kennebrew/Extender: Bethena Andre Pura Lenis Devra in Treatment: 17 Visit Information History Since Last Visit Added or deleted any medications: No Patient Arrived: Wheel Chair Any new allergies or adverse reactions: No Arrival Time: 12:10 Had a fall or experienced change in No Accompanied By: family activities of daily living that may affect Transfer Assistance: None risk of falls: Patient Identification Verified: Yes Hospitalized since last visit: No Secondary Verification Process Completed: Yes Has Dressing in Place as Prescribed: Yes Patient Requires Transmission-Based No Pain Present Now: Yes Precautions: Patient Has Alerts: Yes Patient Alerts: Patient on Blood Thinner type 2 diabetic Stroke 2021, R sided weak Electronic Signature(s) Signed: 03/12/2023 12:13:17 PM By: Vaughn Mora Entered By: Vaughn Mora on 03/12/2023 12:13:17 -------------------------------------------------------------------------------- Clinic Level of Care Assessment Details Patient Name: Date of Service: [name] [name] 03/12/2023 12:00 PM Medical Record Number: [id number] Patient Account Number: [id number] Date of Birth/Sex: Treating RN: 06-26-1932 (89 y.o. [name] Primary Care Annisha Baar: Pura Lenis Other Clinician: Referring Nautia Lem: Treating Mammie Meras/Extender: Bethena Andre Pura Lenis Devra in Treatment: 17 Clinic Level of Care Assessment Items TOOL 4 Quantity Score []  - 0 Use when only an EandM is performed on FOLLOW-UP visit ASSESSMENTS -  Nursing Assessment / Reassessment X- 1 10 Reassessment of Co-morbidities (includes updates in patient status) X- 1 5 Reassessment of Adherence to Treatment Plan HAYVEN, CROY ([id number]) [id number] Page 2 of 6 ASSESSMENTS - Wound and Skin A ssessment / Reassessment X - Simple Wound Assessment / Reassessment - one wound 1 5 []  - 0 Complex Wound Assessment / Reassessment - multiple wounds []  - 0 Dermatologic / Skin Assessment (not related to wound area) ASSESSMENTS - Focused Assessment []  - 0 Circumferential Edema Measurements - multi extremities []  - 0 Nutritional Assessment / Counseling / Intervention []  - 0 Lower Extremity Assessment (monofilament, tuning fork, pulses) []  - 0 Peripheral Arterial Disease Assessment (using hand held doppler) ASSESSMENTS - Ostomy and/or Continence Assessment and Care []  - 0 Incontinence Assessment and Management []  - 0 Ostomy Care Assessment and Management (repouching, etc.) PROCESS - Coordination of Care X - Simple Patient / Family Education for ongoing care 1 15 []  - 0 Complex (extensive) Patient / Family Education for ongoing care X- 1 10 Staff obtains Chiropractor, Records, T Results / Process Orders est []  - 0 Staff telephones HHA, Nursing Homes / Clarify orders / etc []  - 0 Routine Transfer to another Facility (non-emergent condition) []  - 0 Routine Hospital Admission (non-emergent condition) []  - 0 New Admissions / Manufacturing Engineer / Ordering NPWT Apligraf, etc. , []  - 0 Emergency Hospital Admission (emergent condition) X- 1 10 Simple Discharge Coordination []  - 0 Complex (extensive) Discharge Coordination PROCESS - Special Needs []  - 0 Pediatric / Minor Patient Management []  - 0 Isolation Patient Management []  - 0 Hearing / Language / Visual special needs []  - 0 Assessment of Community assistance (transportation, D/C planning, etc.) []  - 0 Additional assistance / Altered mentation []   - 0 Support Surface(s) Assessment (bed, cushion, seat, etc.) INTERVENTIONS - Wound Cleansing / Measurement X - Simple Wound Cleansing - one wound 1 5 []  - 0 Complex Wound Cleansing - multiple wounds X-  1 5 Wound Imaging (photographs - any number of wounds) []  - 0 Wound Tracing (instead of photographs) X- 1 5 Simple Wound Measurement - one wound []  - 0 Complex Wound Measurement - multiple wounds INTERVENTIONS - Wound Dressings X - Small Wound Dressing one or multiple wounds 1 10 []  - 0 Medium Wound Dressing one or multiple wounds []  - 0 Large Wound Dressing one or multiple wounds X- 1 5 Application of Medications - topical []  - 0 Application of Medications - injection INTERVENTIONS - Miscellaneous []  - 0 External ear exam JASMEEN, FRITSCH ([id number]) [phone] Page 3 of 6 []  - 0 Specimen Collection (cultures, biopsies, blood, body fluids, etc.) []  - 0 Specimen(s) / Culture(s) sent or taken to Lab for analysis []  - 0 Patient Transfer (multiple staff / Deitra Lift / Similar devices) []  - 0 Simple Staple / Suture removal (25 or less) []  - 0 Complex Staple / Suture removal (26 or more) []  - 0 Hypo / Hyperglycemic Management (close monitor of Blood Glucose) []  - 0 Ankle / Brachial Index (ABI) - do not check if billed separately X- 1 5 Vital Signs Has the patient been seen at the hospital within the last three years: Yes Total Score: 90 Level Of Care: New/Established - Level 3 Electronic Signature(s) Signed: 03/12/2023 4:34:26 PM By: Vaughn Mora Entered By: Vaughn Mora on 03/12/2023 12:37:12 -------------------------------------------------------------------------------- Lower Extremity Assessment Details Patient Name: Date of Service: [name] [name] 03/12/2023 12:00 PM Medical Record Number: [id number] Patient Account Number: [id number] Date of Birth/Sex: Treating RN: 1932-12-01 (89 y.o. [name] Primary Care Kreed Kauffman:  Pura Lenis Other Clinician: Referring Lynzee Lindquist: Treating Taji Barretto/Extender: Bethena Andre Pura Lenis Devra in Treatment: 17 Electronic Signature(s) Signed: 03/12/2023 12:13:59 PM By: Vaughn Mora Entered By: Vaughn Mora on 03/12/2023 12:13:59 -------------------------------------------------------------------------------- Multi Wound Chart Details Patient Name: Date of Service: CLAUDENE [name] 03/12/2023 12:00 PM Medical Record Number: [id number] Patient Account Number: [id number] Date of Birth/Sex: Treating RN: [birth year] (89 y.o. [name] Primary Care Eliyahu Bille: Pura Lenis Other Clinician: Referring Onnie Alatorre: Treating Shaquira Moroz/Extender: Bethena Andre Pura Lenis Devra in Treatment: 17 Vital Signs Height(in): 66 Pulse(bpm): 62 Weight(lbs): 191 Blood Pressure(mmHg): 147/69 EVANGELINA, DELANCEY ([id number]) [phone].pdf Page 4 of 6 Body Mass Index(BMI): 30.8 Temperature(F): 97.5 Respiratory Rate(breaths/min): 18 [1:Photos:] [N/A:N/A] Right, Medial Gluteus N/A N/A Wound Location: Gradually Appeared N/A N/A Wounding Event: Abscess N/A N/A Primary Etiology: Cataracts, Anemia, Asthma, N/A N/A Comorbid History: Congestive Heart Failure, Hypertension, Type II Diabetes, Osteoarthritis 09/08/2022 N/A N/A Date Acquired: 3 N/A N/A Weeks of Treatment: Open N/A N/A Wound Status: No N/A N/A Wound Recurrence: 3.5x4x0.2 N/A N/A Measurements L x W x D (cm) 10.996 N/A N/A A (cm) : rea 2.199 N/A N/A Volume (cm) : 61.70% N/A N/A % Reduction in A rea: 96.70% N/A N/A % Reduction in Volume: 10 Position 1 (o'clock): 3.7 Maximum Distance 1 (cm): Yes N/A N/A Tunneling: Full Thickness Without Exposed N/A N/A Classification: Support Structures Large N/A N/A Exudate A mount: Serosanguineous N/A N/A Exudate Type: red, brown N/A N/A Exudate Color: Medium (34-66%) N/A N/A Granulation A mount: Red N/A N/A Granulation  Quality: Medium (34-66%) N/A N/A Necrotic A mount: Fat Layer (Subcutaneous Tissue): Yes N/A N/A Exposed Structures: None N/A N/A Epithelialization: Treatment Notes Electronic Signature(s) Signed: 03/12/2023 4:34:26 PM By: Vaughn Mora Entered By: Vaughn Mora on 03/12/2023 12:31:05 -------------------------------------------------------------------------------- Pain Assessment Details Patient Name: Date of Service: ELLIONNA, BUCKBEE [name] 03/12/2023 12:00 PM Medical Record Number: [id number] Patient Account Number: [id number]  Date of Birth/Sex: Treating RN: Jun 18, 1932 (89 y.o. [name] Primary Care Juluis Fitzsimmons: Pura Lenis Other Clinician: Referring Nikkia Devoss: Treating Nakira Litzau/Extender: Bethena Andre Pura Lenis Devra in Treatment: 17 Active Problems Location of Pain Severity and Description of Pain EVI, MCCOMB ([id number]) [phone] Page 5 of 6 Patient Has Paino Yes Site Locations Pain Management and Medication Current Pain Management: Notes new excoriated area very tender Electronic Signature(s) Signed: 03/12/2023 12:13:47 PM By: Vaughn Mora Entered By: Vaughn Mora on 03/12/2023 12:13:47 -------------------------------------------------------------------------------- Wound Assessment Details Patient Name: Date of Service: CHISTINE, DEMATTEO [name] 03/12/2023 12:00 PM Medical Record Number: [id number] Patient Account Number: [id number] Date of Birth/Sex: Treating RN: Mar 01, 1932 (89 y.o. [name] Primary Care Geraldo Haris: Pura Lenis Other Clinician: Referring Icesis Renn: Treating Wiley Magan/Extender: Bethena Andre Pura Lenis Devra in Treatment: 17 Wound Status Wound Number: 1 Primary Abscess Etiology: Wound Location: Right, Medial Gluteus Wound Open Wounding Event: Gradually Appeared Status: Date Acquired: 09/08/2022 Comorbid Cataracts, Anemia, Asthma, Congestive Heart Failure, Weeks Of Treatment: 17 History:  Hypertension, Type II Diabetes, Osteoarthritis Clustered Wound: No Photos ARIANNI, GALLEGO ([id number]) [id number] Page 6 of 6 Wound Measurements Length: (cm) 3.5 Width: (cm) 4 Depth: (cm) 0.2 Area: (cm) 10.996 Volume: (cm) 2.199 % Reduction in Area: 61.7% % Reduction in Volume: 96.7% Epithelialization: None Tunneling: Yes Position (o'clock): 10 Maximum Distance: (cm) 3.7 Undermining: No Wound Description Classification: Full Thickness Without Exposed Support Structures Exudate Amount: Large Exudate Type: Serosanguineous Exudate Color: red, brown Foul Odor After Cleansing: No Slough/Fibrino Yes Wound Bed Granulation Amount: Medium (34-66%) Exposed Structure Granulation Quality: Red Fat Layer (Subcutaneous Tissue) Exposed: Yes Necrotic Amount: Medium (34-66%) Necrotic Quality: Adherent Scientist, Physiological) Signed: 03/12/2023 4:34:26 PM By: Vaughn Mora Entered By: Vaughn Mora on 03/12/2023 12:11:30 -------------------------------------------------------------------------------- Vitals Details Patient Name: Date of Service: CLAUDENE HAWS [name] 03/12/2023 12:00 PM Medical Record Number: [id number] Patient Account Number: [id number] Date of Birth/Sex: Treating RN: 01/03/1933 (89 y.o. [name] Primary Care Marc Sivertsen: Pura Lenis Other Clinician: Referring Tanny Harnack: Treating Alexius Ellington/Extender: Bethena Andre Pura Lenis Devra in Treatment: 17 Vital Signs Time Taken: 12:10 Temperature (F): 97.5 Height (in): 66 Pulse (bpm): 62 Weight (lbs): 191 Respiratory Rate (breaths/min): 18 Body Mass Index (BMI): 30.8 Blood Pressure (mmHg): 147/69 Reference Range: 80 - 120 mg / dl Electronic Signature(s) Signed: 03/12/2023 12:13:32 PM By: Vaughn Mora Entered By: Vaughn Mora on 03/12/2023 12:13:32

## 2023-03-14 NOTE — Progress Notes (Addendum)
 [name], [name] ([id number]) [id number] Page 1 of 8 Visit Report for 03/12/2023 Chief Complaint Document Details Patient Name: Date of Service: [name] [name], [name] [name] 03/12/2023 12:00 PM Medical Record Number: [id number] Patient Account Number: [id number] Date of Birth/Sex: Treating RN: May 09, 1932 (89 y.o. [name] [name] Primary Care Provider: Pura Lenis Other Clinician: Referring Provider: Treating Provider/Extender: Bethena Andre Pura Lenis Devra in Treatment: 17 Information Obtained from: Patient Chief Complaint Bilateral Gluteal Ulcers Electronic Signature(s) Signed: 03/12/2023 12:45:21 PM By: Bethena Andre PA-C Previous Signature: 03/12/2023 12:45:04 PM Version By: Bethena Andre PA-C Previous Signature: 03/12/2023 12:26:21 PM Version By: Bethena Andre PA-C Entered By: Bethena Andre on 03/12/2023 12:45:21 -------------------------------------------------------------------------------- HPI Details Patient Name: Date of Service: [name] [name] [name] 03/12/2023 12:00 PM Medical Record Number: [id number] Patient Account Number: [id number] Date of Birth/Sex: Treating RN: [birth year] (89 y.o. [name] [name] Primary Care Provider: Pura Lenis Other Clinician: Referring Provider: Treating Provider/Extender: Bethena Andre Pura Lenis Devra in Treatment: 17 History of Present Illness HPI Description: 11-10-2022 upon evaluation today patient presents for initial inspection here in our clinic concerning a wound which occurred unfortunately as a result of an abscess. This was the main right gluteal/sacral region. With that being said she noted this first around or just before July 12 when she was admitted to the hospital from the 12th through the 26. The wound was at that point drained and subsequently she thought that she was headed in the right direction. With that being said she ended up having to be admitted to the hospital August and this was due to a CHF  exacerbation with a took 25 pounds of fluid off of her. Subsequently she was then rehab for the wound at [facility] burn and the physician there apparently did an awesome job based on what I am seeing the wound actually looks like is doing quite well at this point. They were doing a fairly complex VAC with the small openings in multiple areas that were being backed as well at this point the good news is I do not think that he would need to be necessary based on what I am seeing. She does have home health coming out to do the wound VAC changes Monday, Wednesday, and Friday although get this scheduled going has been somewhat a concern due to the fact that the patient is been in and out of the hospital. Patient does have a history of having had a stroke in 2001 with some left-sided weakness. She also on September 08, 2022 had her A1c checked this was 9.8. She is a diabetic. Other than these 2 conditions she also has chronic kidney disease stage III, hypertension, and again obviously the wounds which were can be helping to manage at this point. 11-28-2022 upon evaluation today patient appears to be doing well currently in regard to her wound in the sacral area. Most of the surrounding wounds are [name] [name], [name] [name] ([id number]) [phone] Page 2 of 8 actually closed and they look excellent. I am actually very pleased with where we stand. The only downside is she does have a skin tear which is still questionable whether it is going to resolved with a skin flap intact or if it is going skin flap did great and breakdown we will know more as it goes over the next couple of weeks. Nonetheless right now this actually does not look too bad definitely no signs of infection this is on the right lower leg. Otherwise the patient seems to be doing extremely well  with regard to the wound VAC I think that this is doing an awesome job I think putting some collagen down to the tunnel would be beneficial I  think the black foam is still better than the white foam to be honest. 12-12-2022 upon evaluation today patient appears to be doing pretty well currently in regard to her wound. She has been tolerating the wound VAC without complication and seems to be making good progress here. Fortunately I do not see any signs of infection locally or systemically which is great news and in general I do believe that we are making excellent headway towards closure which is great news as well. No fevers, chills, nausea, vomiting, or diarrhea. 12-26-2022 upon evaluation today patient's wound does appear to be doing much better. There does not appear to be any signs of active infection which is great news and in general I think that her making excellent headway towards closure which is great news. No fevers, chills, nausea, vomiting, or diarrhea. 01-16-2023 upon evaluation today patient appears to be doing well currently in regard to her wounds the leg is healed and the gluteal region is significantly improved. I really do not think there is much that a wound VAC can continue to help with at this point. I discussed that with the patient today. 02-06-2023 upon evaluation today patient appears to be doing well currently in regard to her wound. She has been tolerating the dressing changes without complication. With that being said the wound does show some hypergranulation we will continue to try to work on this to some degree here. With that being said I do believe the Hydrofera Blue is a good option here but at the same time I think she still has some signs that she may have infection at this point as well. I will try to see what we can do about improving that overall. 02-15-2023 upon evaluation today patient appears to be doing about the same in regard to her wound she still has quite a bit of drainage I do not feel any bone in the bottom of the wound but she does have quite a bit of drainage that makes us  concerned about  infection. We can actually go ahead and check for infection by way of the PCR culture at this point. 12/26; patient has a surgical wound in the gluteal cleft on the right side. This is come in nicely and continues to contract nicely. She still has 1 probing area that goes precariously close to the bone. Still a fair amount of drainage reported. PCR culture from 02/15/2023 grew Bacteroides fragilis and Fusobacterium and Finegoldia. The latter 2 I do not think are usual pathogens. The patient is now on metronidazole  It sounds as though she still spends a fair amount of time sitting up in the wheelchair at home. 03-06-23 upon evaluation patient appears to actually be doing better in regard to this deeper tunnel as well as the amount of drainage she is having which is great news. Fortunately I do not see any signs of worsening which is also excellent news. No fevers, chills, nausea, vomiting, or diarrhea. 03-12-2023 upon evaluation today patient appears to be doing unfortunately little worse in regard to her wound compared to previous. She has been in the past doing pretty well when it comes to healing. The wound has been getting significantly smaller. Fortunately I do not see any signs of active infection at this time which is great news. Unfortunately she still continues to have some  issues here when I put on the metronidazole  she had issues with GI upset which unfortunately meant that she was not able to take the medication though the wound looks better. With that being said I think she may be able to crush and pack this into the wound with the dressing changes and that will probably be beneficial for her. Electronic Signature(s) Signed: 03/12/2023 12:45:50 PM By: Bethena Ferraris PA-C Entered By: Bethena Ferraris on 03/12/2023 12:45:50 -------------------------------------------------------------------------------- Physical Exam Details Patient Name: Date of Service: KIMBERY, HARWOOD [name] 03/12/2023 12:00  PM Medical Record Number: [id number] Patient Account Number: [id number] Date of Birth/Sex: Treating RN: [birth year] (89 y.o. [name] [name] Primary Care Provider: Pura Lenis Other Clinician: Referring Provider: Treating Provider/Extender: Bethena Ferraris Pura Lenis Devra in Treatment: 17 Constitutional Obese and well-hydrated in no acute distress. Respiratory normal breathing without difficulty. Psychiatric this patient is able to make decisions and demonstrates good insight into disease process. Alert and Oriented x 3. pleasant and cooperative. Notes Patient's wound does have a little bit deeper tunnel unfortunately that is my concern today. Nonetheless I think based on what we are seeing and considering her history we may need to do an updated imaging that I think MRIs can be the way to go to evaluate for any signs of sacral osteomyelitis. Will do this without contrast. [name] [name], [name] [name] ([id number]) [id number] Page 3 of 8 Electronic Signature(s) Signed: 03/12/2023 12:46:31 PM By: Bethena Ferraris PA-C Entered By: Bethena Ferraris on 03/12/2023 12:46:31 -------------------------------------------------------------------------------- Physician Orders Details Patient Name: Date of Service: [name] HAWS [name] 03/12/2023 12:00 PM Medical Record Number: [id number] Patient Account Number: [id number] Date of Birth/Sex: Treating RN: 20-Nov-1932 (89 y.o. 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 Home Health Home Health Company: - [facility] for wound care. May utilize formulary equivalent dressing for wound treatment orders unless otherwise specified. Home Health Nurse may visit PRN to address patients wound care needs. Scheduled days for dressing changes to be completed; exception, patient has scheduled wound care visit that day. **Please direct any NON-WOUND related issues/requests for orders to patient's Primary Care Physician. **If current dressing causes regression in wound condition, may D/C ordered dressing product/s and apply Normal Saline Moist Dressing daily until next Wound Healing Center or Other MD appointment. **Notify Wound Healing Center of regression in wound condition at [phone]. Other Home Health Orders/Instructions: - crush Metronidazole  tablet and sprinkle on wound base and in tunnel during dressing change prior to applying silvercell. Bathing/ Shower/ Hygiene No tub bath. Anesthetic (Use 'Patient Medications' Section for Anesthetic Order Entry) Lidocaine  applied to wound bed Off-Loading Turn and reposition every 2 hours Other: - stay off bottom as much as possible Negative Pressure Wound Therapy Discontinue NPWT. Medications-Please add to medication list. ntibiotic - crush Metronidazole  tablet and sprinkle on wound base  and in tunnel during dressing change prior to applying silvercell. Topical A Wound Treatment Wound #1 - Gluteus Wound Laterality: Right, Medial [name] [name], [name] [name] ([id number])  [id number] Page 4 of 8 Cleanser: Vashe 5.8 (oz) 1 x Per Day/15 Days Discharge Instructions: Use vashe 5.8 (oz) as directed Peri-Wound Care: Desitin Maximum Strength Ointment 4 (oz) 1 x Per Day/15 Days Discharge Instructions: to excoriated area and around edges of wound Apply around wound edges to avoid maceration Topical: crush and sprinkle Metronidazole  on wound bed and tunnel 1 x Per Day/15 Days Prim Dressing: Iodoform 1/4x 5 (in/yd) 1 x Per Day/15 Days ary Discharge Instructions: Apply Iodoform Packing Strip as instructed. Prim Dressing: Silvercel Small 2x2 (in/in) 1 x Per Day/15 Days ary Discharge Instructions: Apply Silvercel Small 2x2 (in/in) as instructed Secondary Dressing: (BORDER) Zetuvit Plus SILICONE BORDER Dressing 4x4 (in/in) 1 x Per Day/15 Days Discharge Instructions: Please do not put silicone bordered dressings under wraps. Use non-bordered dressing only. Radiology MRI without Contrast - sacral, right glute wound Electronic Signature(s) Signed: 03/12/2023 4:34:26 PM By: Vaughn [name] Signed: 03/12/2023 4:43:18 PM By: Bethena Ferraris PA-C Entered By: Vaughn [name] on 03/12/2023 12:50:37 -------------------------------------------------------------------------------- Problem List Details Patient Name: Date of Service: [name] HAWS [name] 03/12/2023 12:00 PM Medical Record Number: [id number] Patient Account Number: [id number] Date of Birth/Sex: Treating RN: 11/06/1932 (89 y.o. [name] [name] Primary Care Provider: Pura Lenis Other Clinician: Referring Provider: Treating Provider/Extender: Bethena Ferraris Pura Lenis Devra in Treatment: 17 Active Problems ICD-10 Encounter Code Description Active Date MDM Diagnosis T81.31XA Disruption of external operation (surgical) wound, not elsewhere classified, 11/10/2022 No Yes initial encounter L02.31 Cutaneous abscess of buttock 11/10/2022 No Yes L98.415 Non-pressure chronic ulcer of buttock with  muscle involvement without 11/10/2022 No Yes evidence of necrosis E11.622 Type 2 diabetes mellitus with other skin ulcer 11/10/2022 No Yes I10 Essential (primary) hypertension 11/10/2022 No Yes [name] [name], [name] [name] ([id number]) [phone].pdf Page 5 of 8 I50.42 Chronic combined systolic (congestive) and diastolic (congestive) heart failure 11/10/2022 No Yes N18.30 Chronic kidney disease, stage 3 unspecified 11/10/2022 No Yes S81.811A Laceration without foreign body, right lower leg, initial encounter 11/28/2022 No Yes Inactive Problems Resolved Problems Electronic Signature(s) Signed: 03/12/2023 12:26:15 PM By: Bethena Ferraris PA-C Entered By: Bethena Ferraris on 03/12/2023 12:26:15 -------------------------------------------------------------------------------- Progress Note Details Patient Name: Date of Service: [name] HAWS [name] 03/12/2023 12:00 PM Medical Record Number: [id number] Patient Account Number: [id number] Date of Birth/Sex: Treating RN: January 21, 1933 (89 y.o. [name] [name] Primary Care Provider: Pura Lenis Other Clinician: Referring Provider: Treating Provider/Extender: Bethena Ferraris Pura Lenis Devra in Treatment: 17 Subjective Chief Complaint Information obtained from Patient Bilateral Gluteal Ulcers History of Present Illness (HPI) 11-10-2022 upon evaluation today patient presents for initial inspection here in our clinic concerning a wound which occurred unfortunately as a result of an abscess. This was the main right gluteal/sacral region. With that being said she noted this first around or just before July 12 when she was admitted to the hospital from the 12th through the 26. The wound was at that point drained and subsequently she thought that she was headed in the right direction. With that being said she ended up having to be admitted to the hospital August and this was due to a CHF exacerbation with a took 25 pounds of fluid off of her. Subsequently  she was then rehab for the wound at [facility] burn and the physician there apparently did an awesome job based on what I am seeing the wound actually looks like is  doing quite well at this point. They were doing a fairly complex VAC with the small openings in multiple areas that were being backed as well at this point the good news is I do not think that he would need to be necessary based on what I am seeing. She does have home health coming out to do the wound VAC changes Monday, Wednesday, and Friday although get this scheduled going has been somewhat a concern due to the fact that the patient is been in and out of the hospital. Patient does have a history of having had a stroke in 2001 with some left-sided weakness. She also on September 08, 2022 had her A1c checked this was 9.8. She is a diabetic. Other than these 2 conditions she also has chronic kidney disease stage III, hypertension, and again obviously the wounds which were can be helping to manage at this point. 11-28-2022 upon evaluation today patient appears to be doing well currently in regard to her wound in the sacral area. Most of the surrounding wounds are actually closed and they look excellent. I am actually very pleased with where we stand. The only downside is she does have a skin tear which is still questionable whether it is going to resolved with a skin flap intact or if it is going skin flap did great and breakdown we will know more as it goes over the next couple of weeks. Nonetheless right now this actually does not look too bad definitely no signs of infection this is on the right lower leg. Otherwise the patient seems to be doing extremely well with regard to the wound VAC I think that this is doing an awesome job I think putting some collagen down to the tunnel would be beneficial I think the black foam is still better than the white foam to be honest. 12-12-2022 upon evaluation today patient appears to be doing pretty well  currently in regard to her wound. She has been tolerating the wound VAC without complication and seems to be making good progress here. Fortunately I do not see any signs of infection locally or systemically which is great news and in general I do believe that we are making excellent headway towards closure which is great news as well. No fevers, chills, nausea, vomiting, or diarrhea. 12-26-2022 upon evaluation today patient's wound does appear to be doing much better. There does not appear to be any signs of active infection which is [name] [name], [name] [name] ([id number]) [id number] Page 6 of 8 great news and in general I think that her making excellent headway towards closure which is great news. No fevers, chills, nausea, vomiting, or diarrhea. 01-16-2023 upon evaluation today patient appears to be doing well currently in regard to her wounds the leg is healed and the gluteal region is significantly improved. I really do not think there is much that a wound VAC can continue to help with at this point. I discussed that with the patient today. 02-06-2023 upon evaluation today patient appears to be doing well currently in regard to her wound. She has been tolerating the dressing changes without complication. With that being said the wound does show some hypergranulation we will continue to try to work on this to some degree here. With that being said I do believe the Hydrofera Blue is a good option here but at the same time I think she still has some signs that she may have infection at this point as well. I will try to see what  we can do about improving that overall. 02-15-2023 upon evaluation today patient appears to be doing about the same in regard to her wound she still has quite a bit of drainage I do not feel any bone in the bottom of the wound but she does have quite a bit of drainage that makes us  concerned about infection. We can actually go ahead and check for infection  by way of the PCR culture at this point. 12/26; patient has a surgical wound in the gluteal cleft on the right side. This is come in nicely and continues to contract nicely. She still has 1 probing area that goes precariously close to the bone. Still a fair amount of drainage reported. PCR culture from 02/15/2023 grew Bacteroides fragilis and Fusobacterium and Finegoldia. The latter 2 I do not think are usual pathogens. The patient is now on metronidazole  It sounds as though she still spends a fair amount of time sitting up in the wheelchair at home. 03-06-23 upon evaluation patient appears to actually be doing better in regard to this deeper tunnel as well as the amount of drainage she is having which is great news. Fortunately I do not see any signs of worsening which is also excellent news. No fevers, chills, nausea, vomiting, or diarrhea. 03-12-2023 upon evaluation today patient appears to be doing unfortunately little worse in regard to her wound compared to previous. She has been in the past doing pretty well when it comes to healing. The wound has been getting significantly smaller. Fortunately I do not see any signs of active infection at this time which is great news. Unfortunately she still continues to have some issues here when I put on the metronidazole  she had issues with GI upset which unfortunately meant that she was not able to take the medication though the wound looks better. With that being said I think she may be able to crush and pack this into the wound with the dressing changes and that will probably be beneficial for her. Objective Constitutional Obese and well-hydrated in no acute distress. Vitals Time Taken: 12:10 PM, Height: 66 in, Weight: 191 lbs, BMI: 30.8, Temperature: 97.5 F, Pulse: 62 bpm, Respiratory Rate: 18 breaths/min, Blood Pressure: 147/69 mmHg. Respiratory normal breathing without difficulty. Psychiatric this patient is able to make decisions and demonstrates  good insight into disease process. Alert and Oriented x 3. pleasant and cooperative. General Notes: Patient's wound does have a little bit deeper tunnel unfortunately that is my concern today. Nonetheless I think based on what we are seeing and considering her history we may need to do an updated imaging that I think MRIs can be the way to go to evaluate for any signs of sacral osteomyelitis. Will do this without contrast. Integumentary (Hair, Skin) Wound #1 status is Open. Original cause of wound was Gradually Appeared. The date acquired was: 09/08/2022. The wound has been in treatment 17 weeks. The wound is located on the Right,Medial Gluteus. The wound measures 3.5cm length x 4cm width x 0.2cm depth; 10.996cm^2 area and 2.199cm^3 volume. There is Fat Layer (Subcutaneous Tissue) exposed. There is no undermining noted, however, there is tunneling at 10:00 with a maximum distance of 3.7cm. There is a large amount of serosanguineous drainage noted. There is medium (34-66%) red granulation within the wound bed. There is a medium (34-66%) amount of necrotic tissue within the wound bed including Adherent Slough. 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 Home Health: Home Health Company: - [facility] for wound care. May utilize formulary equivalent dressing for wound  treatment orders unless otherwise specified. Home Health Nurse may visit PRN to address patients wound care needs. Scheduled days for dressing changes to be completed; exception, patient has scheduled wound care visit that day. **Please direct any NON-WOUND related issues/requests for orders to patient's Primary Care Physician. **If current dressing causes regression in wound condition, may D/C ordered dressing product/s and apply Normal Saline Moist Dressing daily until next Wound Healing Center or Other MD appointment. **Notify Wound Healing Center of regression in wound condition at [phone]. Other Home Health Orders/Instructions: - crush Metronidazole  tablet and sprinkle on wound base and in tunnel during dressing change prior to applying silvercell. Bathing/ Shower/ Hygiene: No tub bath. Anesthetic (Use 'Patient Medications' Section for Anesthetic Order Entry): Lidocaine  applied to wound bed Off-Loading: Turn and reposition every 2 hours Other: - stay off bottom as much as possible Negative Pressure Wound Therapy: Discontinue NPWT. Medications-Please add to medication list.: Topical Antibiotic - crush Metronidazole  tablet and sprinkle on wound base and in tunnel during dressing change prior to applying silvercell. Radiology ordered were: MRI without Contrast - right glute wound WOUND #1: - Gluteus Wound Laterality: Right, Medial Cleanser: Vashe 5.8 (oz) 1 x Per Day/15 Days Discharge Instructions: Use vashe 5.8 (oz) as directed Topical: crush and sprinkle Metronidazole  on wound bed and tunnel 1 x Per Day/15 Days Prim Dressing: Iodoform 1/4x 5 (in/yd) 1 x Per Day/15 Days ary Discharge Instructions: Apply Iodoform Packing Strip as instructed. Prim Dressing: Silvercel Small 2x2 (in/in) 1 x Per Day/15 Days ary Discharge Instructions: Apply Silvercel Small 2x2 (in/in) as instructed Secondary Dressing: (BORDER) Zetuvit Plus SILICONE BORDER Dressing 4x4 (in/in) 1 x Per Day/15  Days Discharge Instructions: Please do not put silicone bordered dressings under wraps. Use non-bordered dressing only. 1. I would recommend that we go ahead and continue to utilize the wound care measures as before with the packing strip which I think has been beneficial. 2. I am good I have requested metronidazole  and packed this into the wound as well. 3. I would also suggest that we going to proceed with an MRI of the sacral area to evaluate for any evidence of osteomyelitis. They would like to get this scheduled at [facility] in [place] which I definitely think is okay. We will see patient back for reevaluation in 1 week here in the clinic. If anything worsens or changes patient will contact our office for additional recommendations. Electronic Signature(s) Signed: 03/12/2023 12:47:42 PM By: Bethena Ferraris PA-C Entered By: Bethena Ferraris on 03/12/2023 12:47:42 -------------------------------------------------------------------------------- SuperBill Details Patient Name: Date of Service: [name] HAWS [name] 03/12/2023 Medical Record Number: [id number] Patient Account Number: [id number] Date of Birth/Sex: Treating RN: 1932/03/14 (89 y.o. 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 [name] [name], ZUFALL ([id number]) [id number] Page 8 of 8 E11.622 Type 2 diabetes mellitus with other skin ulcer I10 Essential (primary) hypertension I50.42 Chronic combined systolic (congestive) and diastolic (congestive) heart failure N18.30 Chronic kidney disease, stage 3  unspecified S81.811A Laceration without foreign body, right lower leg, initial encounter Facility Procedures : CPT4 Code: 23899826 Description: 99213 - WOUND CARE VISIT-LEV 3 EST PT Modifier: Quantity: 1 Physician Procedures : CPT4: Description Modifier Code [id number] 99214 - WC PHYS LEVEL 4 - EST PT ICD-10 Diagnosis Description T81.31XA Disruption of external operation (surgical) wound, not elsewhere classified, initial encounter L02.31 Cutaneous abscess of buttock L98.415  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

## 2023-03-15 ENCOUNTER — Ambulatory Visit (HOSPITAL_COMMUNITY)
Admission: RE | Admit: 2023-03-15 | Discharge: 2023-03-15 | Disposition: A | Discharge status: Home / Self Care | Payer: Medicare Other | Source: Ambulatory Visit | Attending: Physician Assistant | Admitting: Physician Assistant

## 2023-03-15 DIAGNOSIS — L98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: Secondary | ICD-10-CM | POA: Diagnosis present

## 2023-03-19 ENCOUNTER — Other Ambulatory Visit
Admission: RE | Admit: 2023-03-19 | Discharge: 2023-03-19 | Disposition: A | Discharge status: Home / Self Care | Payer: Medicare Other | Source: Ambulatory Visit | Attending: Physician Assistant | Admitting: Physician Assistant

## 2023-03-19 ENCOUNTER — Encounter: Payer: Medicare Other | Admitting: Physician Assistant

## 2023-03-19 DIAGNOSIS — L98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: Secondary | ICD-10-CM | POA: Diagnosis present

## 2023-03-19 DIAGNOSIS — E11622 Type 2 diabetes mellitus with other skin ulcer: Secondary | ICD-10-CM | POA: Diagnosis not present

## 2023-03-19 LAB — CBC WITH DIFFERENTIAL/PLATELET
Abs Immature Granulocytes: 0.02 10*3/uL (ref 0.00–0.07)
Basophils Absolute: 0 10*3/uL (ref 0.0–0.1)
Basophils Relative: 1 %
Eosinophils Absolute: 0.3 10*3/uL (ref 0.0–0.5)
Eosinophils Relative: 5 %
HCT: 35.9 % — ABNORMAL LOW (ref 36.0–46.0)
Hemoglobin: 11.4 g/dL — ABNORMAL LOW (ref 12.0–15.0)
Immature Granulocytes: 0 %
Lymphocytes Relative: 25 %
Lymphs Abs: 1.6 10*3/uL (ref 0.7–4.0)
MCH: 28.8 pg (ref 26.0–34.0)
MCHC: 31.8 g/dL (ref 30.0–36.0)
MCV: 90.7 fL (ref 80.0–100.0)
Monocytes Absolute: 0.7 10*3/uL (ref 0.1–1.0)
Monocytes Relative: 11 %
Neutro Abs: 3.8 10*3/uL (ref 1.7–7.7)
Neutrophils Relative %: 58 %
Platelets: 224 10*3/uL (ref 150–400)
RBC: 3.96 MIL/uL (ref 3.87–5.11)
RDW: 20.3 % — ABNORMAL HIGH (ref 11.5–15.5)
WBC: 6.5 10*3/uL (ref 4.0–10.5)
nRBC: 0 % (ref 0.0–0.2)

## 2023-03-19 LAB — COMPREHENSIVE METABOLIC PANEL
ALT: 15 U/L (ref 0–44)
AST: 23 U/L (ref 15–41)
Albumin: 4.1 g/dL (ref 3.5–5.0)
Alkaline Phosphatase: 69 U/L (ref 38–126)
Anion gap: 11 (ref 5–15)
BUN: 65 mg/dL — ABNORMAL HIGH (ref 8–23)
CO2: 30 mmol/L (ref 22–32)
Calcium: 9.2 mg/dL (ref 8.9–10.3)
Chloride: 102 mmol/L (ref 98–111)
Creatinine, Ser: 2 mg/dL — ABNORMAL HIGH (ref 0.44–1.00)
GFR, Estimated: 24 mL/min — ABNORMAL LOW (ref >60)
Glucose, Bld: 133 mg/dL — ABNORMAL HIGH (ref 70–99)
Potassium: 4.4 mmol/L (ref 3.5–5.1)
Sodium: 143 mmol/L (ref 135–145)
Total Bilirubin: 0.7 mg/dL (ref 0.0–1.2)
Total Protein: 7.9 g/dL (ref 6.5–8.1)

## 2023-03-19 LAB — C-REACTIVE PROTEIN: CRP: 0.5 mg/dL (ref <1.0)

## 2023-03-19 LAB — SEDIMENTATION RATE: Sed Rate: 63 mm/h — ABNORMAL HIGH (ref 0–30)

## 2023-03-19 NOTE — Progress Notes (Signed)
[name] [name], [name] [name] ([id number]) [id number] Page 1 of 1 Visit Report for 03/19/2023 [facility] Patient Questionnaire Details Patient Name: Date of Service: [name] [name], [name] [name] 03/19/2023 3:30 PM Medical Record Number: [id number] Patient Account Number: [id number] Date of Birth/Sex: Treating RN: 12-Apr-1932 (89 y.o. [name] [name] Primary Care Jackolyn Geron: [name] Other Clinician: Referring Holley Wirt: Treating Hershall Benkert/Extender: Lavonia Drafts in Treatment: 18 The following information was scribed by: Angelina Pih The following information was scribed for: Allen Derry [facility] Patient Questionnaire Items Answer A "Yes" answers must be brought to the hyperbaric physician's attention. ny Breathing or Lung problemso No Currently use tobacco productso No Used tobacco products in the pasto No Heart problemso Yes Seen a doctor for any heart or blood pressure problemso Yes If yes, provide the name of doctor: [facility] Do you take water pills (diuretic)o Yes Diabeteso Yes On Diabetes pillo No On Insulino Yes If yes, last time taken: 03/19/2023 Dialysiso No Eye problems like glaucomao Yes Ear problems or surgeryo No Sinus Problemso Yes Cancero No Confinement Anxiety (Claustrophobia- fear of confined places)o No Any medical implants/devices that are fully or partially implanted or attached to your bodyo No Pregnanto No Seizureso No Electronic Signature(s) Signed: 03/19/2023 4:55:40 PM By: Angelina Pih Entered By: Angelina Pih on 03/19/2023 16:55:39

## 2023-03-19 NOTE — Progress Notes (Addendum)
[name] [name], [name] [name] ([id number]) [id number] Page 1 of 11 Visit Report for 03/19/2023 Chief Complaint Document Details Patient Name: Date of Service: [name] [name], [name] [name] 03/19/2023 3:30 PM Medical Record Number: [id number] Patient Account Number: [id number] Date of Birth/Sex: Treating RN: 22-Apr-1932 (89 y.o. [name] [name] Primary Care Provider: [name] Other Clinician: Referring Provider: Treating Provider/Extender: Lavonia Drafts in Treatment: 18 Information Obtained from: Patient Chief Complaint Bilateral Gluteal Ulcers Electronic Signature(s) Signed: 03/19/2023 5:02:24 PM By: Allen Derry PA-[name] Previous Signature: 03/19/2023 3:21:20 PM Version By: Allen Derry PA-[name] Entered By: Allen Derry on 03/19/2023 17:02:24 -------------------------------------------------------------------------------- HPI Details Patient Name: Date of Service: [name] [name], [name] [name] 03/19/2023 3:30 PM Medical Record Number: [id number] Patient Account Number: [id number] Date of Birth/Sex: Treating RN: 12/24/1932 (89 y.o. [name] [name] Primary Care Provider: [name] Other Clinician: Referring Provider: Treating Provider/Extender: Lavonia Drafts in Treatment: 18 History of Present Illness HPI Description: 11-10-2022 upon evaluation today patient presents for initial inspection here in our clinic concerning a wound which occurred unfortunately as a result of an abscess. This was the main right gluteal/sacral region. With that being said she noted this first around or just before July 12 when she was admitted to the hospital from the 12th through the 26. The wound was at that point drained and subsequently she thought that she was headed in the right direction. With that being said she ended up having to be admitted to the hospital August and this was due to a CHF exacerbation with a took 25 pounds of fluid off of her. Subsequently she was then  rehab for the wound at [facility] burn and the physician there apparently did an awesome job based on what I am seeing the wound actually looks like is doing quite well at this point. They were doing a fairly complex VAC with the small openings in multiple areas that were being backed as well at this point the good news is I do not think that he would need to be necessary based on what I am seeing. She does have home health coming out to do the wound VAC changes Monday, Wednesday, and Friday although get this scheduled going has been somewhat a concern due to the fact that the patient is been in and out of the hospital. Patient does have a history of having had a stroke in 2001 with some left-sided weakness. She also on September 08, 2022 had her A1c checked this was 9.8. She is a diabetic. Other than these 2 conditions she also has chronic kidney disease stage III, hypertension, and again obviously the wounds which were can be helping to manage at this point. 03-19-23 upon evaluation today patient appears to be doing okay in regard to her wound although she still having a tremendous amount of drainage. We have been using metronidazole topically to try to help out as much as we could. We do have the MRI which was completed and I did review that as well. It did show [name] [name], [name] [name] ([id number]) [id number] Page 2 of 11 that she had currently a right buttock subcutaneous fat ulceration with associated deeper right paramidline posterior buttock subcutaneous fat sinus track at the level of the first and second coccygeal segments that contacts the dorsal aspect of the first coccygeal segment. 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 Of also had her on antibiotics multiple times in the past several months. She was placed on Bactrim mid December which she was on for a couple of weeks. I then placed her on metronidazole following and that was at the end of December beginning into January. Unfortunately the metronidazole and that was based on culture results was causing GI upset we were having to stop this orally would be doing it topically but topical is not can to be sufficient to clear out a chronic osteomyelitis which seems to be present at this point. I think that we can need something potentially stronger. I am going to obtain an updated culture today and we will see what the results of the show as well. I did review patient's EKG which showed sinus rhythm and that was October 28, 2022. Also did review the echocardiogram which was on 09-12-2022 that showed an ejection fraction of 60 to 65% appears to be doing okay. Subsequently her most recent hemoglobin A1c was 9.9 and that was actually performed 12-10- 2024. The CMP which was performed on 01-15-2023 showed that she did have some lower findings on the GFR at around 24. For this reason especially in light of the fact we may be looking at antibiotics I feel like that we need to know exactly what antibiotic is going be best and safest for her therefore we can probably do some updated testing here. Her most recent CBC was also quite a ways back in October and showed a hemoglobin of 10.7 hematocrit 33.3. Electronic Signature(s) Signed: 03/19/2023 5:14:14 PM By: Allen Derry PA-[name] Entered By: Allen Derry on 03/19/2023 17:14:14 -------------------------------------------------------------------------------- Physical Exam Details Patient Name: Date of Service: [name] [name], [name] [name] 03/19/2023 3:30 PM Medical Record Number: [id number] Patient Account Number: [id number] Date of Birth/Sex: Treating RN: 1932-10-19 (89 y.o. [name] [name] Primary Care  Provider: [name] Other Clinician: Referring Provider: Treating Provider/Extender: Lavonia Drafts in Treatment: 18 Constitutional Well-nourished and well-hydrated in no acute distress. Respiratory normal breathing without difficulty. Psychiatric this patient is able to make decisions and demonstrates good insight into disease process. Alert and Oriented x 3. pleasant and cooperative. Notes Upon inspection patient's wound bed actually showed signs still of having quite a bit of drainage. Fortunately I do not see any signs of systemic infection that is obvious locally she is still having a lot of drainage again and again we have been using metronidazole which may be helping some but is not in the treated deeper osteomyelitis. For that reason I did obtain a deeper wound culture after cleaning the wound today and this culture was taken from the deepest aspect of the wound that I could reach with a culture swab. Subsequently we will see what this shows and make adjustments in care as necessary going forward. Electronic Signature(s) Signed: 03/19/2023 5:14:49 PM By: Allen Derry PA-[name] Entered By: Allen Derry on 03/19/2023 17:14:49 Physician Orders Details -------------------------------------------------------------------------------- Mikael Spray ([id number]) [id number] Page 3 of 11 Patient Name: Date of Service: [name] [name], [name] [name] 03/19/2023 3:30 PM Medical Record Number: [id number] Patient Account Number: [id number] Date of Birth/Sex: Treating RN: Jan 15, 1933 (89 y.o. 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 Home Health Home Health Company: - [facility] for wound care. May utilize formulary equivalent dressing for wound treatment orders unless otherwise specified. Home Health Nurse may visit PRN to address patients wound care needs. Scheduled days for dressing changes to be completed; exception, patient has scheduled wound care visit that day. **Please direct any NON-WOUND related issues/requests for orders to patient's Primary Care Physician. **If current dressing causes regression in wound condition, may D/[name] ordered dressing product/s and apply Normal Saline Moist Dressing daily until next Wound Healing Center or Other MD appointment. **Notify Wound Healing Center of regression in wound condition at [phone]. Other Home Health Orders/Instructions: - crush Metronidazole tablet and sprinkle on wound base and in tunnel during dressing change prior to applying silvercell. Bathing/ Shower/ Hygiene No tub bath. Anesthetic (Use 'Patient Medications' Section for Anesthetic Order Entry) Lidocaine applied to wound bed Off-Loading Turn and reposition every 2 hours Other: - stay off bottom as much as possible Negative Pressure Wound Therapy Discontinue NPWT. Hyperbaric Oxygen Therapy Evaluate for HBO Therapy Indication and location: - Right midline glute chronic refractory osteomyelitis If appropriate for  treatment, begin HBOT per protocol: 2.0 ATA for 90 Minutes without A Breaks ir One treatment per day (delivered Monday through Friday unless otherwise specified in Special Instructions below): Total # of Treatments: - approximately 40 treatments A ntihistamine 30 minutes prior to HBO Treatment, difficulty clearing ears. Finger stick Blood Glucose Pre- and Post- HBOT Treatment. Follow Hyperbaric Oxygen Glycemia Protocol Medications-Please add to medication list. ntibiotic - crush Metronidazole tablet and sprinkle on wound base and in tunnel during dressing change prior to applying silvercell. Topical A Wound Treatment Wound #1 - Gluteus Wound Laterality: Right, Medial Cleanser: Vashe 5.8 (oz) 1 x Per Day/15 Days Discharge Instructions: Use vashe 5.8 (oz) as directed Peri-Wound Care: Desitin Maximum Strength Ointment 4 (oz) 1 x Per Day/15 Days Discharge Instructions: to excoriated area and around edges of wound Apply around wound edges to avoid maceration Topical: crush and sprinkle Metronidazole on wound bed and tunnel 1 x Per Day/15 Days Prim Dressing: Iodoform 1/4x 5 (in/yd) 1 x Per Day/15 Days ary Discharge Instructions: Apply Iodoform Packing Strip as instructed. [name] [name], [name] [name] ([id number]) [id number] Page 4 of 11 Prim Dressing: Silvercel Small 2x2 (in/in) 1 x Per Day/15 Days ary Discharge Instructions: Apply Silvercel Small 2x2 (in/in) as instructed Secondary Dressing: (BORDER) Zetuvit Plus SILICONE BORDER Dressing 4x4 (in/in) 1 x Per Day/15 Days Discharge Instructions: Please do not put silicone bordered dressings under wraps. Use non-bordered dressing only. Laboratory Bacteria identified in Wound by Culture (MICRO) - right midline glute culture [name] [name] Code: 6462-6 Convenience Name: Wound culture routine CBC W A Differential panel in Blood (HEM-CBC) - (ICD10 L98.415 - 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 without uto evidence  of necrosis) [name] [name] Code: 82956-2 Convenience Name: CBC W Auto Differential panel Comprehensive 2000 panel in Serum or Plasma (CHEM-panel) - (ICD10 L98.415 - Non-pressure chronic ulcer of buttock with muscle  involvement without evidence of necrosis) [name] [name] Code: [phone] Convenience Name: Comprehensive metabolic panel=CMS Erythrocyte sedimentation rate (HEM) - (ICD10 L98.415 - 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 A. Notify HBO physician and await physician orders. 2 If result is 70 mg/dl or below: B. If the result meets the hospital definition of a critical result, follow hospital policy. A. Give patient an 8 ounce Glucerna Shake, an 8 ounce Ensure, or 8 ounces of a Glucerna/Ensure equivalent dietary supplement*. B. Wait 30 minutes. If result is 71 mg/dl to 811 mg/dl: [name]. Retest patients capillary blood glucose (CBG). D. If result greater than or equal to 110 mg/dl, proceed with HBO. If result less than 110 mg/dl, notify HBO physician and consider holding HBO. If result is 131 mg/dl to 914 mg/dl: A. Proceed with HBO. A. Notify HBO physician and await physician orders. B. It is recommended to hold HBO and do If result is 250 mg/dl or greater: blood/urine ketone testing. [name]. If the result meets the hospital definition of a critical result, follow hospital policy. POST-HBO GLYCEMIA INTERVENTIONS ACTION INTERVENTION Obtain post HBO capillary blood glucose  (ensure 1 physician order is in chart). A. Notify HBO physician and await physician orders. 2 If result is 70 mg/dl or below: B. If the result meets the hospital definition of a critical result, follow hospital policy. A. Give patient an 8 ounce Glucerna Shake, an 8 ounce Ensure, or 8 ounces of a Glucerna/Ensure equivalent dietary supplement*. B. Wait 15 minutes for symptoms of If result is 71 mg/dl to 782 mg/dl: hypoglycemia (i.e. nervousness, anxiety, sweating, chills, clamminess, irritability, confusion, tachycardia or dizziness). [name]. If patient asymptomatic, discharge patient. If patient symptomatic, repeat capillary blood [name] [name], [name] [name] ([id number]) [phone].pdf Page 5 of 11 glucose (CBG) and notify HBO physician. If result is 101 mg/dl to 474 mg/dl: A. Discharge patient. A. Notify HBO physician and await physician orders. B. It is recommended to do blood/urine ketone If result is 250 mg/dl or greater: testing. [name]. If the result meets the hospital definition of a critical result, follow hospital policy. *Juice or candies are NOT equivalent products. If patient refuses the Glucerna or Ensure, please consult the hospital dietitian for an appropriate substitute. Electronic Signature(s) Signed: 03/19/2023 5:02:42 PM By: Angelina Pih Signed: 03/19/2023 5:18:10 PM By: Allen Derry PA-[name] Previous Signature: 03/19/2023 4:38:34 PM Version By: Allen Derry PA-[name] Entered By: Angelina Pih on 03/19/2023 17:00:14 Prescription 03/19/2023 -------------------------------------------------------------------------------- Darnelle Bos [name]. Allen Derry PA-[name] Patient Name: Provider: 23-Oct-1932 [id number] Date of Birth: NPI#: [id number] Sex: DEA #: [phone] Phone #: License #: UPN: Patient Address: [phone] [facility] [address] [facility] [place], [place] [address] [facility] [address] [place], [place] [address] [phone] Allergies amlodipine; ACE Inhibitors; atorvastatin; canagliflozin; clonidine; buprenorphine; lisinopril; morphine; codeine Provider's Orders CBC W A Differential panel in Blood - ICD10: L98.415 uto [name] [name] Code: [phone] Convenience Name: CBC W Auto Differential panel Hand Signature: Date(s): Prescription 03/19/2023 Girgenti, Mayte [name]. Allen Derry PA-[name] Patient Name: Provider: December 24, 1932 [id number] Date of Birth: NPI#: [id number] Sex: DEA #: [phone]  [phone] Phone #: License #: UPN: Patient Address: [phone] [facility] [address] [facility] [place], [place] [address] [facility] [address], [address] [place], [place] [address] [phone] Allergies amlodipine; ACE Inhibitors; atorvastatin; canagliflozin; clonidine; buprenorphine; lisinopril; morphine; codeine [address] STEVE, SCHEIBLE ([id number]) [phone].pdf Page 6 of 11 Comprehensive 2000 panel in Serum or Plasma - ICD10: L98.415 [name] [name] Code: [phone] Convenience Name: Comprehensive metabolic panel=CMS Hand Signature: Date(s): Prescription 03/19/2023 Doescher, Keyira [name]. Allen Derry PA-[name] Patient Name: Provider: 1932-11-21 [id number] Date of Birth: NPI#: [id number] Sex: DEA #: [phone] Phone #: License #: UPN: Patient Address: [phone] [facility] [address] [facility] [place], [place] [address] [facility] [address] [place], [place] [address] [phone] Allergies amlodipine; ACE Inhibitors; atorvastatin; canagliflozin; clonidine; buprenorphine; lisinopril; morphine; codeine Provider's Orders Erythrocyte sedimentation rate - ICD10: L98.415 [name] [name] Code: [phone] Convenience Name: Sed rate-method unspecified Hand Signature: Date(s): Prescription 03/19/2023 Forde, Shaylin [name]. Allen Derry PA-[name] Patient Name: Provider: May 22, 1932  [id number] Date of Birth: NPI#: [id number] Sex: DEA #: [phone] Phone #: License #: UPN: Patient Address: [phone] [facility] [address] [facility] [place], [place] [address] [facility] [address] [place], [place] [address] [phone] Allergies amlodipine; ACE Inhibitors; atorvastatin; canagliflozin; clonidine; buprenorphine; lisinopril; morphine; codeine Provider's Orders [name] reactive protein [Mass/volume] in Serum or Plasma - ICD10: L98.415 [name] [name] Code: 1988-5 Convenience Name: [name] Reactive Protein in serum or plasma Hand Signature: Date(s): Electronic Signature(s) Signed: 03/19/2023 5:02:42 PM By: Angelina Pih Signed: 03/19/2023 5:18:10 PM By: Allen Derry PA-[name] Previous Signature: 03/19/2023 4:38:34 PM Version By: Allen Derry PA-[name] Entered By: Angelina Pih on 03/19/2023 17:00:14 Mikael Spray ([id number]) [id number] Page 7 of 11 -------------------------------------------------------------------------------- Problem List Details Patient Name: Date of Service: KENNIDY, WYSZYNSKI 03/19/2023 3:30 PM Medical Record Number: [id number] Patient Account Number: [id number] Date of Birth/Sex: Treating RN: [birth year] (89 y.o. [name] [name] Primary Care Provider: [name] Other Clinician: Referring Provider: Treating Provider/Extender: Lavonia Drafts in Treatment: 18 Active Problems ICD-10 Encounter Code Description Active Date MDM Diagnosis T81.31XA Disruption of external operation (surgical) wound, not elsewhere classified, 11/10/2022 No Yes initial encounter M86.38 Chronic multifocal osteomyelitis, other site 11/10/2022 No Yes 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 without 11/10/2022 No Yes evidence of necrosis E11.622 Type 2 diabetes mellitus with other skin ulcer 11/10/2022 No Yes I10 Essential (primary) hypertension  11/10/2022 No Yes I50.42 Chronic combined systolic (congestive) and diastolic (congestive) heart failure 11/10/2022 No Yes N18.30 Chronic kidney disease, stage 3 unspecified 11/10/2022 No Yes S81.811A Laceration without foreign body, right lower leg, initial encounter 11/28/2022 No Yes Inactive Problems Resolved Problems Electronic Signature(s) Signed: 03/19/2023 5:02:05 PM By: Allen Derry PA-[name] Previous Signature: 03/19/2023 3:21:14 PM Version By: Allen Derry PA-[name] Entered By: Allen Derry on 03/19/2023 17:02:05 Mikael Spray ([id number]) [id number] Page 8 of 11 -------------------------------------------------------------------------------- Progress Note Details Patient Name: Date of Service: [name] [name], [name] [name] 03/19/2023 3:30 PM Medical Record Number: [id number] Patient Account Number: [id number] Date of Birth/Sex: Treating RN: [birth year] (89 y.o. [name] [name] Primary Care Provider: [name] Other Clinician: Referring Provider: Treating Provider/Extender: Lavonia Drafts in Treatment: 18 Subjective Chief Complaint Information obtained from Patient Bilateral Gluteal Ulcers History of Present Illness (HPI) 11-10-2022 upon evaluation today patient presents for initial inspection here in our clinic concerning a wound which occurred unfortunately as a result of an abscess. This was  the main right gluteal/sacral region. With that being said she noted this first around or just before July 12 when she was admitted to the hospital from the 12th through the 26. The wound was at that point drained and subsequently she thought that she was headed in the right direction. With that being said she ended up having to be admitted to the hospital August and this was due to a CHF exacerbation with a took 25 pounds of fluid off of her. Subsequently she was then rehab for the wound at [facility] burn and the physician there apparently did an awesome job based  on what I am seeing the wound actually looks like is doing quite well at this point. They were doing a fairly complex VAC with the small openings in multiple areas that were being backed as well at this point the good news is I do not think that he would need to be necessary based on what I am seeing. She does have home health coming out to do the wound VAC changes Monday, Wednesday, and Friday although get this scheduled going has been somewhat a concern due to the fact that the patient is been in and out of the hospital. Patient does have a history of having had a stroke in 2001 with some left-sided weakness. She also on September 08, 2022 had her A1c checked this was 9.8. She is a diabetic. Other than these 2 conditions she also has chronic kidney disease stage III, hypertension, and again obviously the wounds which were can be helping to manage at this point. 03-19-23 upon evaluation today patient appears to be doing okay in regard to her wound although she still having a tremendous amount of drainage. We have been using metronidazole topically to try to help out as much as we could. We do have the MRI which was completed and I did review that as well. It did show that she had currently a right buttock subcutaneous fat ulceration with associated deeper right paramidline posterior buttock subcutaneous fat sinus track at the level of the first and second coccygeal segments that contacts the dorsal aspect of the first coccygeal segment. 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 Of also had her on antibiotics multiple times in the past several months. She was placed on Bactrim mid December which she was on for a  couple of weeks. I then placed her on metronidazole following and that was at the end of December beginning into January. Unfortunately the metronidazole and that was based on culture results was causing GI upset we were having to stop this orally would be doing it topically but topical is not can to be sufficient to clear out a chronic osteomyelitis which seems to be present at this point. I think that we can need something potentially stronger. I am going to obtain an updated culture today and we will see what the results of the show as well. I did review patient's EKG which showed sinus rhythm and that was October 28, 2022. Also did review the echocardiogram which was on 09-12-2022 that showed an ejection fraction of 60 to 65% appears to be doing okay. Subsequently her most recent hemoglobin A1c was 9.9 and that  was actually performed 12-10- 2024. The CMP which was performed on 01-15-2023 showed that she did have some lower findings on the GFR at around 24. For this reason especially in light of the fact we may be looking at antibiotics I feel like that we need to know exactly what antibiotic is going be best and safest for her therefore we can probably do some updated testing here. Her most recent CBC was also quite a ways back in October and showed a hemoglobin of 10.7 hematocrit 33.3. Medications carvedilol 12.5 mg tablet oral metronidazole 500 mg tablet oral 1 1 tablet oral three times daily x 21 days acetaminophen 500 mg tablet oral tablet oral once daily atorvastatin 40 mg tablet oral hydralazine 50 mg tablet oral tablet oral twice a day amantadine HCl 100 mg capsule oral quetiapine 25 mg tablet oral albuterol sulfate HFA 90 mcg/actuation aerosol inhaler inhalation amlodipine 2.5 mg tablet oral guaifenesin ER 600 mg tablet, extended release 12 hr oral tablet extended release 12hr oral once daily Humalog U-100 Insulin 100 unit/mL subcutaneous solution subcutaneous solution subcutaneous  three times daily Lantus Solostar U-100 Insulin 100 unit/mL (3 mL) subcutaneous pen subcutaneous insulin pen subcutaneous twice a day ferrous sulfate 325 mg (65 mg iron) tablet oral tablet oral once daily bisacodyl 5 mg tablet oral tablet oral polyethylene glycol 3350 17 gram oral powder packet oral powder in packet oral Senna with Docusate Sodium 8.6 mg-50 mg tablet oral tablet oral torsemide 20 mg tablet oral tablet oral once daily Glucerna oral liquid oral liquid oral once daily tramadol 37.5 mg-acetaminophen 325 mg tablet oral [name] [name], [name] [name] ([id number]) [phone].pdf Page 9 of 11 tramadol 50 mg tablet oral Dakin's Solution 0.125 % miscellaneous solution miscellaneous once daily aspirin 81 mg tablet,delayed release oral tablet,delayed release (DR/EC) oral once daily spironolactone 25 mg tablet oral pantoprazole 40 mg tablet,delayed release oral methocarbamol 500 mg tablet oral diclofenac 1 % topical gel topical lidocaine 5 % topical patch topical B Complex-Vitamin [name] 20 mg-5 mg-2 mg-75 mcg chewable tablet oral tablet,chewable oral once daily Vitamin [name] (ascorbate calcium) 814 mg/gram oral powder oral powder oral once daily Objective Constitutional Well-nourished and well-hydrated in no acute distress. Vitals Time Taken: 3:39 PM, Height: 66 in, Weight: 191 lbs, BMI: 30.8, Temperature: 97.4 F, Pulse: 76 bpm, Blood Pressure: 154/75 mmHg. Respiratory normal breathing without difficulty. Psychiatric this patient is able to make decisions and demonstrates good insight into disease process. Alert and Oriented x 3. pleasant and cooperative. General Notes: Upon inspection patient's wound bed actually showed signs still of having quite a bit of drainage. Fortunately I do not see any signs of systemic infection that is obvious locally she is still having a lot of drainage again and again we have been using metronidazole which may be helping some but is not in the  treated deeper osteomyelitis. For that reason I did obtain a deeper wound culture after cleaning the wound today and this culture was taken from the deepest aspect of the wound that I could reach with a culture swab. Subsequently we will see what this shows and make adjustments in care as necessary going forward. Integumentary (Hair, Skin) Wound #1 status is Open. Original cause of wound was Gradually Appeared. The date acquired was: 09/08/2022. The wound has been in treatment 18 weeks. The wound is located on the Right,Medial Gluteus. The wound measures 1.7cm length x 3.2cm width x 0.4cm depth; 4.273cm^2 area and 1.709cm^3 volume. There is Fat Layer (Subcutaneous Tissue) exposed. There is no  undermining noted, however, there is tunneling at 10:00 with a maximum distance of 4.5cm. There is a large amount of serosanguineous drainage noted. There is large (67-100%) red granulation within the wound bed. There is a small (1-33%) amount of necrotic tissue within the wound bed including Adherent Slough. 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 Home Health: Home Health Company: - [facility] for wound care. May utilize formulary equivalent dressing for wound treatment orders unless otherwise specified. Home Health Nurse may visit PRN to address patients wound care needs. Scheduled days for dressing changes to be completed; exception,  patient has scheduled wound care visit that day. **Please direct any NON-WOUND related issues/requests for orders to patient's Primary Care Physician. **If current dressing causes regression in wound condition, may D/[name] ordered dressing product/s and apply Normal Saline Moist Dressing daily until next Wound Healing Center or Other MD appointment. **Notify Wound Healing Center of regression in wound condition at [phone]. Other Home Health Orders/Instructions: - crush Metronidazole tablet and sprinkle on wound base and in tunnel during dressing change prior to applying silvercell. Bathing/ Shower/ Hygiene: No tub bath. Anesthetic (Use 'Patient Medications' Section for Anesthetic Order Entry): Lidocaine applied to wound bed Off-Loading: Turn and reposition every 2 hours Other: - stay off bottom as much as possible Negative Pressure Wound Therapy: Discontinue NPWT. [name] [name], [name] [name] ([id number]) [id number].pdf Page 10 of 11 Hyperbaric Oxygen Therapy: Evaluate for HBO Therapy Indication and location: - Right midline glute chronic refractory osteomyelitis If appropriate for treatment, begin HBOT per protocol: 2.0 ATA for 90 Minutes without Air Breaks One treatment per day (delivered Monday through Friday unless otherwise specified in Special Instructions below): T # of Treatments: - approximately 40 treatments otal Antihistamine 30 minutes prior to HBO Treatment, difficulty clearing ears. Finger stick Blood Glucose Pre- and Post- HBOT Treatment. Follow Hyperbaric Oxygen Glycemia Protocol Medications-Please add to medication list.: Topical Antibiotic - crush Metronidazole tablet and sprinkle on wound base and in tunnel during dressing change prior to applying silvercell. Laboratory ordered were: Wound culture routine - right midline glute culture, CBC W Auto Differential panel, Comprehensive metabolic panel=CMS, Sed rate -method unspecified, [name] Reactive Protein in  serum or plasma WOUND #1: - Gluteus Wound Laterality: Right, Medial Cleanser: Vashe 5.8 (oz) 1 x Per Day/15 Days Discharge Instructions: Use vashe 5.8 (oz) as directed Peri-Wound Care: Desitin Maximum Strength Ointment 4 (oz) 1 x Per Day/15 Days Discharge Instructions: to excoriated area and around edges of wound Apply around wound edges to avoid maceration Topical: crush and sprinkle Metronidazole on wound bed and tunnel 1 x Per Day/15 Days Prim Dressing: Iodoform 1/4x 5 (in/yd) 1 x Per Day/15 Days ary Discharge Instructions: Apply Iodoform Packing Strip as instructed. Prim Dressing: Silvercel Small 2x2 (in/in) 1 x Per Day/15 Days ary Discharge Instructions: Apply Silvercel Small 2x2 (in/in) as instructed Secondary Dressing: (BORDER) Zetuvit Plus SILICONE BORDER Dressing 4x4 (in/in) 1 x Per Day/15 Days Discharge Instructions: Please do not put silicone bordered dressings under wraps. Use non-bordered dressing only. 1. I am going to go ahead and after discussing hyperbaric oxygen therapy with the  patient I think that this would be an option for her that we are going to try to get approval through her insurance 4. I think this chronic and refractory osteomyelitis needs to be aggressively handled at this point as it has been a very long time since even she was initially admitted here into the clinic which was November 10, 2022. Prior to that she had been in [facility] for quite a while where she did wear Seve excellent care as well but at the same time I think that we are still at the point now where hyperbarics would be ideal to try to get this wound to close. I explained to the patient she would only do hyperbarics as long as there is an open wound if we get this to closed and hyperbarics would cease. The goal however is to improve both the overall wound to complete closure as well as clear the deeper infection of the sacrum. There is no way that any surgery is gena be beneficial in regard to  the sacrum at this point and therefore I think that our best bet to achieve full closure is 2 hyperbaric oxygen therapy, antibiotics, and good wound care. 2. I am going to also go ahead and see about getting her on antibiotics, to see what the results of the most recent culture show before starting on anything oral again. We also need to keep in mind her kidney function and dose things appropriately in that regard as needed. 3. I am also can recommend that the patient should continue with the iodoform gauze packing. I think this is doing well we will can use some Calmoseptine around the edges of the wound. 4. I would also suggest that we go ahead and update some lab work and I will to send orders for a CBC, CMP, [name]-reactive protein, and sed rate. We will see patient back for reevaluation in 1 week here in the clinic. If anything worsens or changes patient will contact our office for additional recommendations. Electronic Signature(s) Signed: 03/19/2023 5:17:10 PM By: Allen Derry PA-[name] Entered By: Allen Derry on 03/19/2023 17:17:10 -------------------------------------------------------------------------------- SuperBill Details Patient Name: Date of Service: BUFFEY, THURLOW 03/19/2023 Medical Record Number: [id number] Patient Account Number: [id number] Date of Birth/Sex: Treating RN: May 17, 1932 (89 y.o. 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 EMILIE, KASKO ([id number]) [phone].pdf Page 11 of 11 E11.622 Type 2 diabetes mellitus with  other skin ulcer I10 Essential (primary) hypertension I50.42 Chronic combined systolic (congestive) and diastolic (congestive) heart failure N18.30 Chronic kidney disease, stage 3 unspecified S81.811A Laceration without foreign body, right lower leg, initial encounter Facility Procedures : CPT4 Code: [id number] Description: 99213 - WOUND CARE VISIT-LEV 3 EST PT Modifier: Quantity: 1 Physician Procedures : CPT4: Description Modifier Code [id number] 99214 - WC PHYS LEVEL 4 - EST PT ICD-10 Diagnosis Description T81.31XA Disruption of external operation (surgical) wound, not elsewhere classified, initial encounter M86.38 Chronic multifocal osteomyelitis, other  site 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

## 2023-03-19 NOTE — Progress Notes (Addendum)
[name], [name] ([id number]) [id number] Page 1 of 9 Visit Report for 03/19/2023 Arrival Information Details Patient Name: Date of Service: [name], [name] 03/19/2023 3:30 PM Medical Record Number: [id number] Patient Account Number: [id number] Date of Birth/Sex: Treating RN: Aug 22, 1932 (89 y.o. [name] [name] Primary Care Rozalyn Osland: [name] Other Clinician: Referring Chevelle Durr: Treating Neamiah Sciarra/Extender: Lavonia Drafts in Treatment: 18 Visit Information History Since Last Visit Added or deleted any medications: No Patient Arrived: Ambulatory Any new allergies or adverse reactions: No Arrival Time: 15:37 Had a fall or experienced change in No Accompanied By: family activities of daily living that may affect Transfer Assistance: Manual risk of falls: Patient Identification Verified: Yes Signs or symptoms of abuse/neglect since last visito No Secondary Verification Process Completed: Yes Hospitalized since last visit: No Patient Requires Transmission-Based No Implantable device outside of the clinic excluding No Precautions: cellular tissue based products placed in the center Patient Has Alerts: Yes since last visit: Patient Alerts: Patient on Blood Thinner Pain Present Now: No type 2 diabetic Stroke 2021, R sided weak Electronic Signature(s) Signed: 03/19/2023 5:02:42 PM By: Angelina Pih Entered By: Angelina Pih on 03/19/2023 15:38:07 -------------------------------------------------------------------------------- Clinic Level of Care Assessment Details Patient Name: Date of Service: [name], [name] 03/19/2023 3:30 PM Medical Record Number: [id number] Patient Account Number: [id number] Date of Birth/Sex: Treating RN: [birth year] (89 y.o. [name] [name] Primary Care Rogerio Boutelle: [name] Other Clinician: Referring Yamilex Borgwardt: Treating Barak Bialecki/Extender: Lavonia Drafts in Treatment:  18 Clinic Level of Care Assessment Items TOOL 4 Quantity Score []  - 0 Use when only an EandM is performed on FOLLOW-UP visit ASSESSMENTS - Nursing Assessment / Reassessment X- 1 10 Reassessment of Co-morbidities (includes updates in patient status) X- 1 5 Reassessment of Adherence to Treatment Plan [name], [name] ([id number]) [phone].pdf Page 2 of 9 ASSESSMENTS - Wound and Skin A ssessment / Reassessment X - Simple Wound Assessment / Reassessment - one wound 1 5 []  - 0 Complex Wound Assessment / Reassessment - multiple wounds []  - 0 Dermatologic / Skin Assessment (not related to wound area) ASSESSMENTS - Focused Assessment []  - 0 Circumferential Edema Measurements - multi extremities []  - 0 Nutritional Assessment / Counseling / Intervention []  - 0 Lower Extremity Assessment (monofilament, tuning fork, pulses) []  - 0 Peripheral Arterial Disease Assessment (using hand held doppler) ASSESSMENTS - Ostomy and/or Continence Assessment and Care []  - 0 Incontinence Assessment and Management []  - 0 Ostomy Care Assessment and Management (repouching, etc.) PROCESS - Coordination of Care X - Simple Patient / Family Education for ongoing care 1 15 []  - 0 Complex (extensive) Patient / Family Education for ongoing care X- 1 10 Staff obtains Chiropractor, Records, T Results / Process Orders est []  - 0 Staff telephones HHA, Nursing Homes / Clarify orders / etc []  - 0 Routine Transfer to another Facility (non-emergent condition) []  - 0 Routine Hospital Admission (non-emergent condition) []  - 0 New Admissions / Manufacturing engineer / Ordering NPWT Apligraf, etc. , []  - 0 Emergency Hospital Admission (emergent condition) X- 1 10 Simple Discharge Coordination []  - 0 Complex (extensive) Discharge Coordination PROCESS - Special Needs []  - 0 Pediatric / Minor Patient Management []  - 0 Isolation Patient Management []  - 0 Hearing / Language / Visual  special needs []  - 0 Assessment of Community assistance (transportation, D/[name] planning, etc.) []  - 0 Additional assistance / Altered mentation []  - 0 Support Surface(s) Assessment (bed, cushion, seat, etc.) INTERVENTIONS - Wound Cleansing / Measurement  X - Simple Wound Cleansing - one wound 1 5 []  - 0 Complex Wound Cleansing - multiple wounds X- 1 5 Wound Imaging (photographs - any number of wounds) []  - 0 Wound Tracing (instead of photographs) X- 1 5 Simple Wound Measurement - one wound []  - 0 Complex Wound Measurement - multiple wounds INTERVENTIONS - Wound Dressings []  - 0 Small Wound Dressing one or multiple wounds X- 1 15 Medium Wound Dressing one or multiple wounds []  - 0 Large Wound Dressing one or multiple wounds X- 1 5 Application of Medications - topical []  - 0 Application of Medications - injection INTERVENTIONS - Miscellaneous []  - 0 External ear exam [name], [name] ([id number]) [phone].pdf Page 3 of 9 []  - 0 Specimen Collection (cultures, biopsies, blood, body fluids, etc.) []  - 0 Specimen(s) / Culture(s) sent or taken to Lab for analysis []  - 0 Patient Transfer (multiple staff / Michiel Sites Lift / Similar devices) []  - 0 Simple Staple / Suture removal (25 or less) []  - 0 Complex Staple / Suture removal (26 or more) []  - 0 Hypo / Hyperglycemic Management (close monitor of Blood Glucose) []  - 0 Ankle / Brachial Index (ABI) - do not check if billed separately X- 1 5 Vital Signs Has the patient been seen at the hospital within the last three years: Yes Total Score: 95 Level Of Care: New/Established - Level 3 Electronic Signature(s) Signed: 03/19/2023 5:02:42 PM By: Angelina Pih Entered By: Angelina Pih on 03/19/2023 17:00:35 -------------------------------------------------------------------------------- Encounter Discharge Information Details Patient Name: Date of Service: [name] [name] 03/19/2023 3:30 PM Medical  Record Number: [id number] Patient Account Number: [id number] Date of Birth/Sex: Treating RN: 01-14-1933 (89 y.o. [name] [name] Primary Care Bexleigh Theriault: [name] Other Clinician: Referring Saren Corkern: Treating Modene Andy/Extender: Lavonia Drafts in Treatment: 18 Encounter Discharge Information Items Discharge Condition: Stable Ambulatory Status: Wheelchair Discharge Destination: Home Transportation: Private Auto Accompanied By: family Schedule Follow-up Appointment: Yes Clinical Summary of Care: Electronic Signature(s) Signed: 03/19/2023 5:01:46 PM By: Angelina Pih Entered By: Angelina Pih on 03/19/2023 17:01:46 -------------------------------------------------------------------------------- Lower Extremity Assessment Details Patient Name: Date of Service: RENNEE, LIENHART 03/19/2023 3:30 PM [name], [name] ([id number]) [phone].pdf Page 4 of 9 Medical Record Number: [id number] Patient Account Number: [id number] Date of Birth/Sex: Treating RN: 06/27/1932 (89 y.o. [name] [name] Primary Care Jazzie Trampe: [name] Other Clinician: Referring Caelan Atchley: Treating Jonael Paradiso/Extender: Lavonia Drafts in Treatment: 18 Electronic Signature(s) Signed: 03/19/2023 5:02:42 PM By: Angelina Pih Entered By: Angelina Pih on 03/19/2023 15:49:24 -------------------------------------------------------------------------------- Multi Wound Chart Details Patient Name: Date of Service: CALEIGHA, BRAUTIGAM 03/19/2023 3:30 PM Medical Record Number: [id number] Patient Account Number: [id number] Date of Birth/Sex: Treating RN: [birth year] (89 y.o. [name] [name] Primary Care Kasidy Gianino: [name] Other Clinician: Referring Giordana Weinheimer: Treating Lucine Bilski/Extender: Lavonia Drafts in Treatment: 18 Vital Signs Height(in): 66 Pulse(bpm): 76 Weight(lbs): 191 Blood Pressure(mmHg): 154/75 Body Mass Index(BMI):  30.8 Temperature(F): 97.4 Respiratory Rate(breaths/min): [1:Photos:] [N/A:N/A] Right, Medial Gluteus N/A N/A Wound Location: Gradually Appeared N/A N/A Wounding Event: Abscess N/A N/A Primary Etiology: Cataracts, Anemia, Asthma, N/A N/A Comorbid History: Congestive Heart Failure, Hypertension, Type II Diabetes, Osteoarthritis 09/08/2022 N/A N/A Date Acquired: 18 N/A N/A Weeks of Treatment: Open N/A N/A Wound Status: No N/A N/A Wound Recurrence: 1.7x3.2x0.4 N/A N/A Measurements L x W x D (cm) 4.273 N/A N/A A (cm) : rea 1.709 N/A N/A Volume (cm) : 85.10% N/A N/A % Reduction in A rea: 97.40% N/A N/A %  Reduction in Volume: 10 Position 1 (o'clock): 4.5 Maximum Distance 1 (cm): Yes N/A N/A Tunneling: Full Thickness Without Exposed N/A N/A Classification: Support Structures Large N/A N/A Exudate Amount: Serosanguineous N/A N/A Exudate Type: red, brown N/A N/A Exudate Color: Large (67-100%) N/A N/A Granulation Amount: Red N/A N/A Granulation QualityMEAGAN, [name] ([id number]) [id number] Page 5 of 9 Small (1-33%) N/A N/A Necrotic A mount: Fat Layer (Subcutaneous Tissue): Yes N/A N/A Exposed Structures: None N/A N/A Epithelialization: Treatment Notes Wound #1 (Gluteus) Wound Laterality: Right, Medial Cleanser Vashe 5.8 (oz) Discharge Instruction: Use vashe 5.8 (oz) as directed Peri-Wound Care Desitin Maximum Strength Ointment 4 (oz) Discharge Instruction: to excoriated area and around edges of wound Apply around wound edges to avoid maceration Topical crush and sprinkle Metronidazole on wound bed and tunnel Primary Dressing Iodoform 1/4x 5 (in/yd) Discharge Instruction: Apply Iodoform Packing Strip as instructed. Silvercel Small 2x2 (in/in) Discharge Instruction: Apply Silvercel Small 2x2 (in/in) as instructed Secondary Dressing (BORDER) Zetuvit Plus SILICONE BORDER Dressing 4x4 (in/in) Discharge Instruction: Please do  not put silicone bordered dressings under wraps. Use non-bordered dressing only. Secured With Compression Wrap Compression Stockings Add-Ons Electronic Signature(s) Signed: 03/20/2023 8:56:42 AM By: Angelina Pih Previous Signature: 03/19/2023 4:59:58 PM Version By: Angelina Pih Entered By: Angelina Pih on 03/20/2023 08:56:42 -------------------------------------------------------------------------------- Multi-Disciplinary Care Plan Details Patient Name: Date of Service: [name], [name] 03/19/2023 3:30 PM Medical Record Number: [id number] Patient Account Number: [id number] Date of Birth/Sex: Treating RN: [birth year] (89 y.o. [name] [name] Primary Care Javarri Segal: [name] Other Clinician: Referring Laquincy Eastridge: Treating Octavius Shin/Extender: Lavonia Drafts in Treatment: 18 Active Inactive Pressure Nursing Diagnoses: Knowledge deficit related to causes and risk factors for pressure ulcer development Potential for impaired tissue integrity related to pressure, friction, moisture, and shear KRISTELLA, THRON ([id number]) [phone].pdf Page 6 of 9 Goals: Patient will remain free from development of additional pressure ulcers Date Initiated: 11/10/2022 Date Inactivated: 02/06/2023 Target Resolution Date: 02/04/2023 Goal Status: Met Patient will remain free of pressure ulcers Date Initiated: 11/10/2022 Target Resolution Date: 03/28/2023 Goal Status: Active Patient/caregiver will verbalize risk factors for pressure ulcer development Date Initiated: 11/10/2022 Date Inactivated: 11/10/2022 Target Resolution Date: 11/10/2022 Goal Status: Met Patient/caregiver will verbalize understanding of pressure ulcer management Date Initiated: 11/10/2022 Date Inactivated: 11/10/2022 Target Resolution Date: 11/10/2022 Goal Status: Met Interventions: Assess: immobility, friction, shearing, incontinence upon admission and as needed Assess offloading  mechanisms upon admission and as needed Assess potential for pressure ulcer upon admission and as needed Provide education on pressure ulcers Notes: Wound/Skin Impairment Nursing Diagnoses: Impaired tissue integrity Knowledge deficit related to ulceration/compromised skin integrity Goals: Ulcer/skin breakdown will have a volume reduction of 30% by week 4 Date Initiated: 11/10/2022 Date Inactivated: 12/12/2022 Target Resolution Date: 12/08/2022 Goal Status: Met Ulcer/skin breakdown will have a volume reduction of 50% by week 8 Date Initiated: 11/10/2022 Date Inactivated: 01/16/2023 Target Resolution Date: 01/05/2023 Goal Status: Met Ulcer/skin breakdown will have a volume reduction of 80% by week 12 Date Initiated: 11/10/2022 Date Inactivated: 02/06/2023 Target Resolution Date: 02/02/2023 Goal Status: Met Ulcer/skin breakdown will heal within 14 weeks Date Initiated: 11/10/2022 Target Resolution Date: 04/27/2023 Goal Status: Active Interventions: Assess patient/caregiver ability to obtain necessary supplies Assess patient/caregiver ability to perform ulcer/skin care regimen upon admission and as needed Assess ulceration(s) every visit Provide education on ulcer and skin care Notes: Electronic Signature(s) Signed: 03/19/2023 5:00:55 PM By: Angelina Pih Entered By: Angelina Pih on 03/19/2023 17:00:55 -------------------------------------------------------------------------------- Pain Assessment Details Patient Name: Date of Service:  [name], [place] RNELIA [name]. 03/19/2023 3:30 PM Medical Record Number: [id number] Patient Account Number: [id number] Date of Birth/Sex: Treating RN: [birth year] (89 y.o. [name] [name] Primary Care Cosimo Schertzer: [name] Other Clinician: Referring Florinda Taflinger: Treating Tyree Fluharty/Extender: Lavonia Drafts in Treatment: [address] [place] [name] ([id number]) [id number] Page 7 of 9 Active Problems Location of Pain  Severity and Description of Pain Patient Has Paino No Site Locations With Dressing Change: No Rate the pain. Current Pain Level: 0 Pain Management and Medication Current Pain Management: Electronic Signature(s) Signed: 03/19/2023 5:02:42 PM By: Angelina Pih Entered By: Angelina Pih on 03/19/2023 15:39:12 -------------------------------------------------------------------------------- Patient/Caregiver Education Details Patient Name: Date of Service: [name] [name] 1/20/2025andnbsp3:30 PM Medical Record Number: [id number] Patient Account Number: [id number] Date of Birth/Gender: Treating RN: January 02, 1933 (89 y.o. [name] [name] Primary Care Physician: [name] Other Clinician: Referring Physician: Treating Physician/Extender: Lavonia Drafts in Treatment: 18 Education Assessment Education Provided To: Patient Education Topics Provided Pressure: Handouts: Pressure Injury: Prevention and Offloading Methods: Explain/Verbal Responses: State content correctly Wound/Skin Impairment: Handouts: Caring for Your Ulcer Methods: Explain/Verbal Responses: State content correctly [name], [name] ([id number]) [phone].pdf Page 8 of 9 Electronic Signature(s) Signed: 03/19/2023 5:02:42 PM By: Angelina Pih Entered By: Angelina Pih on 03/19/2023 17:01:07 -------------------------------------------------------------------------------- Wound Assessment Details Patient Name: Date of Service: [name], [name] 03/19/2023 3:30 PM Medical Record Number: [id number] Patient Account Number: [id number] Date of Birth/Sex: Treating RN: Mar 11, 1932 (89 y.o. [name] [name] Primary Care Paisli Silfies: [name] Other Clinician: Referring [name] [name]: Treating [name] [name] Henderson/Extender: Lavonia Drafts in Treatment: 18 Wound Status Wound Number: 1 Primary Abscess Etiology: Wound Location: Right, Medial Gluteus Wound Open Wounding  Event: Gradually Appeared Status: Date Acquired: 09/08/2022 Comorbid Cataracts, Anemia, Asthma, Congestive Heart Failure, Weeks Of Treatment: 18 History: Hypertension, Type II Diabetes, Osteoarthritis Clustered Wound: No Photos Wound Measurements Length: (cm) 1.7 Width: (cm) 3.2 Depth: (cm) 0.4 Area: (cm) 4.273 Volume: (cm) 1.709 % Reduction in Area: 85.1% % Reduction in Volume: 97.4% Epithelialization: None Tunneling: Yes Position (o'clock): 10 Maximum Distance: (cm) 4.5 Undermining: No Wound Description Classification: Full Thickness Without Exposed Suppor Exudate Amount: Large Exudate Type: Serosanguineous Exudate Color: red, brown t Structures Foul Odor After Cleansing: No Slough/Fibrino Yes Wound Bed Granulation Amount: Large (67-100%) Exposed Structure Granulation Quality: Red Fat Layer (Subcutaneous Tissue) Exposed: Yes Necrotic Amount: Small (1-33%) Necrotic Quality: [address] YAIRIS, DUDDY ([id number]) [id number] Page 9 of 9 Wound #1 (Gluteus) Wound Laterality: Right, Medial Cleanser Vashe 5.8 (oz) Discharge Instruction: Use vashe 5.8 (oz) as directed Peri-Wound Care Desitin Maximum Strength Ointment 4 (oz) Discharge Instruction: to excoriated area and around edges of wound Apply around wound edges to avoid maceration Topical crush and sprinkle Metronidazole on wound bed and tunnel Primary Dressing Iodoform 1/4x 5 (in/yd) Discharge Instruction: Apply Iodoform Packing Strip as instructed. Silvercel Small 2x2 (in/in) Discharge Instruction: Apply Silvercel Small 2x2 (in/in) as instructed Secondary Dressing (BORDER) Zetuvit Plus SILICONE BORDER Dressing 4x4 (in/in) Discharge Instruction: Please do not put silicone bordered dressings under wraps. Use non-bordered dressing only. Secured With Compression Wrap Compression Stockings Facilities manager) Signed: 03/19/2023 5:02:42 PM By: Angelina Pih Entered By: Angelina Pih on 03/19/2023 15:49:08 -------------------------------------------------------------------------------- Vitals Details Patient Name: Date of Service: [name] [name]. 03/19/2023 3:30 PM Medical Record Number: [id number] Patient Account Number: [id number] Date of Birth/Sex: Treating RN: 1932/08/21 (89 y.o. [name] [name] Primary Care Glendell Fouse: [name] Other Clinician: Referring Noelle Hoogland: Treating Conna Terada/Extender: Lavonia Drafts  in Treatment: 18 Vital Signs Time Taken: 15:39 Temperature (F): 97.4 Height (in): 66 Pulse (bpm): 76 Weight (lbs): 191 Blood Pressure (mmHg): 154/75 Body Mass Index (BMI): 30.8 Reference Range: 80 - 120 mg / dl Electronic Signature(s) Signed: 03/19/2023 5:02:42 PM By: Angelina Pih Entered By: Angelina Pih on 03/19/2023 15:39:07

## 2023-03-26 ENCOUNTER — Encounter: Payer: Medicare Other | Admitting: Physician Assistant

## 2023-03-26 DIAGNOSIS — E11622 Type 2 diabetes mellitus with other skin ulcer: Secondary | ICD-10-CM | POA: Diagnosis not present

## 2023-03-26 LAB — GLUCOSE, CAPILLARY
Glucose-Capillary: 177 mg/dL — ABNORMAL HIGH (ref 70–99)
Glucose-Capillary: 71 mg/dL (ref 70–99)

## 2023-03-27 ENCOUNTER — Encounter: Payer: Medicare Other | Admitting: Physician Assistant

## 2023-03-27 DIAGNOSIS — E11622 Type 2 diabetes mellitus with other skin ulcer: Secondary | ICD-10-CM | POA: Diagnosis not present

## 2023-03-27 LAB — GLUCOSE, CAPILLARY
Glucose-Capillary: 173 mg/dL — ABNORMAL HIGH (ref 70–99)
Glucose-Capillary: 159 mg/dL — ABNORMAL HIGH (ref 70–99)

## 2023-03-28 ENCOUNTER — Encounter: Payer: Self-pay | Admitting: Cardiology

## 2023-03-28 ENCOUNTER — Ambulatory Visit: Payer: Medicare Other | Attending: Cardiology | Admitting: Cardiology

## 2023-03-28 ENCOUNTER — Encounter: Payer: Medicare Other | Admitting: Physician Assistant

## 2023-03-28 VITALS — BP 169/68 | HR 67 | Ht 66.0 in | Wt 177.3 lb

## 2023-03-28 DIAGNOSIS — E782 Mixed hyperlipidemia: Secondary | ICD-10-CM | POA: Diagnosis not present

## 2023-03-28 DIAGNOSIS — N183 Chronic kidney disease, stage 3 unspecified: Secondary | ICD-10-CM

## 2023-03-28 DIAGNOSIS — I10 Essential (primary) hypertension: Secondary | ICD-10-CM

## 2023-03-28 DIAGNOSIS — I5032 Chronic diastolic (congestive) heart failure: Secondary | ICD-10-CM

## 2023-03-28 MED ORDER — HYDRALAZINE HCL 100 MG PO TABS: 100.0000 mg | ORAL_TABLET | Freq: Two times a day (BID) | ORAL | 1 refills | Status: DC

## 2023-03-28 NOTE — Patient Instructions (Signed)
Medication Instructions:  Your physician has recommended you make the following change in your medication:  INCREASE: Hydralazine 100 mg twice daily *If you need a refill on your cardiac medications before your next appointment, please call your pharmacy*  Follow-Up: At [facility], you and your health needs are our priority.  As part of our continuing mission to provide you with exceptional heart care, we have created designated Provider Care Teams.  These Care Teams include your primary Cardiologist (physician) and Advanced Practice Providers (APPs -  Physician Assistants and Nurse Practitioners) who all work together to provide you with the care you need, when you need it.   Your next appointment:   4 month(s)  Provider:   Thomasene Ripple, DO     Other Instructions:

## 2023-03-29 ENCOUNTER — Encounter: Payer: Medicare Other | Admitting: Physician Assistant

## 2023-03-29 DIAGNOSIS — E11622 Type 2 diabetes mellitus with other skin ulcer: Secondary | ICD-10-CM | POA: Diagnosis not present

## 2023-03-29 LAB — GLUCOSE, CAPILLARY
Glucose-Capillary: 215 mg/dL — ABNORMAL HIGH (ref 70–99)
Glucose-Capillary: 136 mg/dL — ABNORMAL HIGH (ref 70–99)

## 2023-03-30 ENCOUNTER — Encounter: Payer: Medicare Other | Admitting: Physician Assistant

## 2023-03-30 DIAGNOSIS — E11622 Type 2 diabetes mellitus with other skin ulcer: Secondary | ICD-10-CM | POA: Diagnosis not present

## 2023-03-30 LAB — GLUCOSE, CAPILLARY
Glucose-Capillary: 211 mg/dL — ABNORMAL HIGH (ref 70–99)
Glucose-Capillary: 174 mg/dL — ABNORMAL HIGH (ref 70–99)

## 2023-03-31 NOTE — Progress Notes (Signed)
Cardiology Office Note:    Date:  03/31/2023   ID:  [name], [name] 04/01/1932, MRN [id number]  PCP:  [name] Harries, MD  Cardiologist:  [name] Ripple, DO  Electrophysiologist:  None   Referring MD: [name] Harries, MD   " I am ok"  History of Present Illness:    [name] [name] is a 89 y.o. female with a hx of chronic diastolic hypertension, diabetes mellitus type 2, CVA, dyslipidemia, CKD 3B, obesity, hypertension here today for follow-up visit.  Since her visit with me she has been get hyperbaric wound treatment.   The infection is believed to have been present for years, possibly related to a fall over fifteen years ago. The patient has been attending hyperbaric chamber sessions for wound healing. The patient's back pain has been severe for the past four to five years, with some days where the patient could hardly get up due to the pain.  The patient's weight has been relatively stable for the past year, with a recent increase in appetite and food intake, including protein-rich foods, to support wound healing. The patient's blood pressure has been fluctuating, with higher readings in the evening. The patient's daughter-in-law, an Charity fundraiser, has been assisting with the patient's care and meal preparation  Past Medical History:  Diagnosis Date   Colon polyp    Diabetes mellitus 1992   Hypertension 1996   Pancreatitis     Past Surgical History:  Procedure Laterality Date   CHOLECYSTECTOMY     COLONOSCOPY     ERCP     INCISION AND DRAINAGE ABSCESS N/A 09/12/2022   Procedure: INCISION AND DRAINAGE ABSCESS;  Surgeon: [name] Soda, MD;  Location: [facility];  Service: General;  Laterality: N/A;   PARTIAL HYSTERECTOMY  89years old   POLYPECTOMY     UPPER GASTROINTESTINAL ENDOSCOPY     WRIST FRACTURE SURGERY  left arm,89years old    Current Medications: Current Meds  Medication Sig   acetaminophen (TYLENOL) 500 MG tablet Take 1,000 mg by mouth 2 (two) times daily.   albuterol  (PROVENTIL) (2.5 MG/3ML) 0.083% nebulizer solution Take 3 mLs (2.5 mg total) by nebulization every 4 (four) hours as needed for wheezing or shortness of breath (per home regimen).   albuterol (VENTOLIN HFA) 108 (90 Base) MCG/ACT inhaler Inhale 2 puffs into the lungs every 6 (six) hours as needed for wheezing or shortness of breath.   amantadine (SYMMETREL) 100 MG capsule Take 1 capsule (100 mg total) by mouth daily.   amoxicillin-clavulanate (AUGMENTIN) 875-125 MG tablet Take 1 tablet by mouth 2 (two) times daily.   ASPIRIN 81 PO Take 81 mg by mouth in the morning.   atorvastatin (LIPITOR) 40 MG tablet Take 1 tablet (40 mg total) by mouth daily.   B Complex-C (B-COMPLEX WITH VITAMIN C) tablet Take 1 tablet by mouth daily.   bisacodyl 5 MG EC tablet Take 5 mg by mouth daily as needed for moderate constipation.   diclofenac Sodium (VOLTAREN ARTHRITIS PAIN) 1 % GEL Apply 2 g topically 4 (four) times daily. (Patient taking differently: Apply 4 g topically 3 (three) times daily. Right knee)   ferrous sulfate 325 (65 FE) MG tablet Take 325 mg by mouth every other day.   Glucerna (GLUCERNA) LIQD Take 237 mLs by mouth daily.   glucose blood (ACCU-CHEK GUIDE) test strip 1 each by Other route in the morning, at noon, in the evening, and at bedtime.   hydrALAZINE (APRESOLINE) 100 MG tablet Take 1 tablet (100 mg  total) by mouth 2 (two) times daily.   insulin lispro (HUMALOG) 100 UNIT/ML injection Inject 14-26 Units into the skin 4 (four) times daily.   Insulin Syringe-Needle U-100 31G X 5/16" 1 ML MISC 1 application by Does not apply route 2 (two) times daily.   ipratropium-albuterol (DUONEB) 0.5-2.5 (3) MG/3ML SOLN Take 3 mLs by nebulization as needed.   LANTUS SOLOSTAR 100 UNIT/ML Solostar Pen Inject 10 Units into the skin 2 (two) times daily. (Patient taking differently: Inject 19 Units into the skin 2 (two) times daily.)   lidocaine (LIDODERM) 5 % APPLY (1) PATCH TO LOWER BACK AND RIGHT KNEE ONCE DAILY.  LEAVE ON 12 HOURS. LEAVE OFF 12 HOURS. (Patient taking differently: Place 1 patch onto the skin See admin instructions. Apply 1 patch to the lower back and right knee once a day- on 12 hours/off 12 hours)   loratadine (CLARITIN) 10 MG tablet Take 10 mg by mouth daily.   methocarbamol (ROBAXIN) 500 MG tablet Take 250 mg by mouth at bedtime as needed for muscle spasms.   polyethylene glycol (MIRALAX / GLYCOLAX) 17 g packet Take 17 g by mouth 2 (two) times daily as needed for mild constipation or moderate constipation.   QUEtiapine (SEROQUEL) 25 MG tablet Take 0.5 tablets (12.5 mg total) by mouth at bedtime as needed (anxiety).   senna-docusate (SENOKOT-S) 8.6-50 MG tablet Take 1 tablet by mouth 2 (two) times daily.   sodium hypochlorite (DAKIN'S 1/4 STRENGTH) 0.125 % SOLN Irrigate with 1 Application as directed every Monday, Wednesday, and Friday.   traMADol (ULTRAM) 50 MG tablet Take 50 mg by mouth at bedtime as needed for moderate pain.   traMADol-acetaminophen (ULTRACET) 37.5-325 MG tablet Take 1 tablet by mouth 2 (two) times daily.     Allergies:   Amlodipine, Ace inhibitors, Atorvastatin, Canagliflozin, Clonidine hcl, Buprenorphine, Lisinopril, and Morphine and codeine   Social History   Socioeconomic History   Marital status: Widowed    Spouse name: Not on file   Number of children: Not on file   Years of education: Not on file   Highest education level: Not on file  Occupational History   Not on file  Tobacco Use   Smoking status: Never   Smokeless tobacco: Never  Vaping Use   Vaping status: Never Used  Substance and Sexual Activity   Alcohol use: Never   Drug use: Never   Sexual activity: Not on file  Other Topics Concern   Not on file  Social History Narrative   Not on file   Social Drivers of Health   Financial Resource Strain: Low Risk  (01/31/2023)   Received from [facility]   Overall Financial Resource Strain (CARDIA)    Difficulty of Paying Living Expenses: Not  hard at all  Food Insecurity: No Food Insecurity (01/31/2023)   [facility] from [facility]   Hunger Vital Sign    Worried About Running Out of Food in the Last Year: Never true    Ran Out of Food in the Last Year: Never true  Transportation Needs: No Transportation Needs (01/31/2023)   Received from [facility] - Transportation    Lack of Transportation (Medical): No    Lack of Transportation (Non-Medical): No  Physical Activity: Unknown (01/31/2023)   Received from [facility]   Exercise Vital Sign    Days of Exercise per Week: 0 days    Minutes of Exercise per Session: Not on file  Stress: Stress Concern Present (01/31/2023)  Received from [facility] - Occupational Stress Questionnaire    Feeling of Stress : Rather much  Social Connections: Socially Integrated (01/31/2023)   Received from [facility]   Social Network    How would you rate your social network (family, work, friends)?: Good participation with social networks     Family History: The patient's family history is not on file.  ROS:   Review of Systems  Constitution: Negative for decreased appetite, fever and weight gain.  HENT: Negative for congestion, ear discharge, hoarse voice and sore throat.   Eyes: Negative for discharge, redness, vision loss in right eye and visual halos.  Cardiovascular: Negative for chest pain, dyspnea on exertion, leg swelling, orthopnea and palpitations.  Respiratory: Negative for cough, hemoptysis, shortness of breath and snoring.   Endocrine: Negative for heat intolerance and polyphagia.  Hematologic/Lymphatic: Negative for bleeding problem. Does not bruise/bleed easily.  Skin: Negative for flushing, nail changes, rash and suspicious lesions.  Musculoskeletal: Negative for arthritis, joint pain, muscle cramps, myalgias, neck pain and stiffness.  Gastrointestinal: Negative for abdominal pain, bowel incontinence, diarrhea and  excessive appetite.  Genitourinary: Negative for decreased libido, genital sores and incomplete emptying.  Neurological: Negative for brief paralysis, focal weakness, headaches and loss of balance.  Psychiatric/Behavioral: Negative for altered mental status, depression and suicidal ideas.  Allergic/Immunologic: Negative for HIV exposure and persistent infections.    EKGs/Labs/Other Studies Reviewed:    The following studies were reviewed today:   EKG:  None today   Recent Labs: 11/03/2022: B Natriuretic Peptide 440.7 01/15/2023: Magnesium 1.7 03/19/2023: ALT 15; BUN 65; Creatinine, Ser 2.00; Hemoglobin 11.4; Platelets 224; Potassium 4.4; Sodium 143  Recent Lipid Panel    Component Value Date/Time   CHOL 201 (H) 01/04/2020 1601   TRIG 398 (H) 01/06/2020 0512   HDL 37 (L) 01/04/2020 1601   CHOLHDL 5.4 01/04/2020 1601   VLDL UNABLE TO CALCULATE IF TRIGLYCERIDE OVER 400 mg/dL 25/95/6387 5643   LDLCALC UNABLE TO CALCULATE IF TRIGLYCERIDE OVER 400 mg/dL 32/95/1884 1660   LDLDIRECT 121.8 (H) 01/04/2020 1601    Physical Exam:    VS:  BP (!) 169/68 (BP Location: Left Arm)   Pulse 67   Ht 5\' 6"  (1.676 m)   Wt 177 lb 4.8 oz (80.4 kg)   SpO2 95%   BMI 28.62 kg/m     Wt Readings from Last 3 Encounters:  03/28/23 177 lb 4.8 oz (80.4 kg)  12/27/22 186 lb 3.2 oz (84.5 kg)  11/13/22 186 lb 3.2 oz (84.5 kg)     GEN: Well nourished, well developed in no acute distress HEENT: Normal NECK: No JVD; No carotid bruits LYMPHATICS: No lymphadenopathy CARDIAC: S1S2 noted,RRR, no murmurs, rubs, gallops RESPIRATORY:  Clear to auscultation without rales, wheezing or rhonchi  ABDOMEN: Soft, non-tender, non-distended, +bowel sounds, no guarding. EXTREMITIES: No edema, No cyanosis, no clubbing MUSCULOSKELETAL:  No deformity  SKIN: Warm and dry NEUROLOGIC:  Alert and oriented x 3, non-focal PSYCHIATRIC:  Normal affect, good insight  ASSESSMENT:    1. Essential hypertension   2. Chronic  diastolic congestive heart failure (HCC)   3. Mixed hyperlipidemia   4. Stage 3 chronic kidney disease, unspecified whether stage 3a or 3b CKD (HCC)     PLAN:    Chronic diastolic heart failure -Weight stable around 185 lbs with daily Lasix 20mg . Discussed the importance of maintaining weight around this range to prevent fluid overload. No symptoms of hypotension reported. -Continue  current torsemide day  -Monitor weight daily and adjust torsemide dose as needed to maintain weight around 185 lbs.  Hypertension-blood pressure is elevated. Will adjust me antihypertensive regimen. Increase hydralazine to 100mg  BID, continue current dose of coreg, Spironolactone,  and amlodipine   Chronic kidney disease-avoid nephrotoxins.  The patient understands the need to lose weight with diet and exercise. We have discussed specific strategies for this.   The patient is in agreement with the above plan. The patient left the office in stable condition.  The patient will follow up in 4 months or sooner if needed.   Medication Adjustments/Labs and Tests Ordered: Current medicines are reviewed at length with the patient today.  Concerns regarding medicines are outlined above.  No orders of the defined types were placed in this encounter.  Meds ordered this encounter  Medications   hydrALAZINE (APRESOLINE) 100 MG tablet    Sig: Take 1 tablet (100 mg total) by mouth 2 (two) times daily.    Dispense:  180 tablet    Refill:  1    Patient Instructions  Medication Instructions:  Your physician has recommended you make the following change in your medication:  INCREASE: Hydralazine 100 mg twice daily *If you need a refill on your cardiac medications before your next appointment, please call your pharmacy*  Follow-Up: At [facility], you and your health needs are our priority.  As part of our continuing mission to provide you with exceptional heart care, we have created designated Provider  Care Teams.  These Care Teams include your primary Cardiologist (physician) and Advanced Practice Providers (APPs -  Physician Assistants and Nurse Practitioners) who all work together to provide you with the care you need, when you need it.   Your next appointment:   4 month(s)  Provider:   Thomasene Ripple, DO     Other Instructions:     Adopting a Healthy Lifestyle.  Know what a healthy weight is for you (roughly BMI <25) and aim to maintain this   Aim for 7+ servings of fruits and vegetables daily   65-80+ fluid ounces of water or unsweet tea for healthy kidneys   Limit to max 1 drink of alcohol per day; avoid smoking/tobacco   Limit animal fats in diet for cholesterol and heart health - choose grass fed whenever available   Avoid highly processed foods, and foods high in saturated/trans fats   Aim for low stress - take time to unwind and care for your mental health   Aim for 150 min of moderate intensity exercise weekly for heart health, and weights twice weekly for bone health   Aim for 7-9 hours of sleep daily   When it comes to diets, agreement about the perfect plan isnt easy to find, even among the experts. Experts at the [facility] [employer] developed an idea known as the Healthy Eating Plate. Just imagine a plate divided into logical, healthy portions.   The emphasis is on diet quality:   Load up on vegetables and fruits - one-half of your plate: Aim for color and variety, and remember that potatoes dont count.   Go for whole grains - one-quarter of your plate: Whole wheat, barley, wheat berries, quinoa, oats, brown rice, and foods made with them. If you want pasta, go with whole wheat pasta.   Protein power - one-quarter of your plate: Fish, chicken, beans, and nuts are all healthy, versatile protein sources. Limit red meat.   The diet, however, does  go beyond the plate, offering a few other suggestions.   Use healthy plant oils, such as olive,  canola, soy, corn, sunflower and peanut. Check the labels, and avoid partially hydrogenated oil, which have unhealthy trans fats.   If youre thirsty, drink water. Coffee and tea are good in moderation, but skip sugary drinks and limit milk and dairy products to one or two daily servings.   The type of carbohydrate in the diet is more important than the amount. Some sources of carbohydrates, such as vegetables, fruits, whole grains, and beans-are healthier than others.   Finally, stay active  Signed, [name] Ripple, DO  03/31/2023 8:21 AM    [facility]

## 2023-04-02 ENCOUNTER — Encounter: Payer: Medicare Other | Attending: Physician Assistant | Admitting: Physician Assistant

## 2023-04-02 ENCOUNTER — Encounter: Payer: Medicare Other | Admitting: Physician Assistant

## 2023-04-02 DIAGNOSIS — L0231 Cutaneous abscess of buttock: Secondary | ICD-10-CM | POA: Insufficient documentation

## 2023-04-02 DIAGNOSIS — E669 Obesity, unspecified: Secondary | ICD-10-CM | POA: Insufficient documentation

## 2023-04-02 DIAGNOSIS — Z6830 Body mass index (BMI) 30.0-30.9, adult: Secondary | ICD-10-CM | POA: Insufficient documentation

## 2023-04-02 DIAGNOSIS — T8131XA Disruption of external operation (surgical) wound, not elsewhere classified, initial encounter: Secondary | ICD-10-CM | POA: Insufficient documentation

## 2023-04-02 DIAGNOSIS — E1122 Type 2 diabetes mellitus with diabetic chronic kidney disease: Secondary | ICD-10-CM | POA: Insufficient documentation

## 2023-04-02 DIAGNOSIS — S81811A Laceration without foreign body, right lower leg, initial encounter: Secondary | ICD-10-CM | POA: Diagnosis not present

## 2023-04-02 DIAGNOSIS — E11622 Type 2 diabetes mellitus with other skin ulcer: Secondary | ICD-10-CM | POA: Insufficient documentation

## 2023-04-02 DIAGNOSIS — L98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: Secondary | ICD-10-CM | POA: Insufficient documentation

## 2023-04-02 DIAGNOSIS — I130 Hypertensive heart and chronic kidney disease with heart failure and stage 1 through stage 4 chronic kidney disease, or unspecified chronic kidney disease: Secondary | ICD-10-CM | POA: Insufficient documentation

## 2023-04-02 DIAGNOSIS — N183 Chronic kidney disease, stage 3 unspecified: Secondary | ICD-10-CM | POA: Insufficient documentation

## 2023-04-02 DIAGNOSIS — E1169 Type 2 diabetes mellitus with other specified complication: Secondary | ICD-10-CM | POA: Diagnosis not present

## 2023-04-02 DIAGNOSIS — I5042 Chronic combined systolic (congestive) and diastolic (congestive) heart failure: Secondary | ICD-10-CM | POA: Diagnosis not present

## 2023-04-02 DIAGNOSIS — Z8673 Personal history of transient ischemic attack (TIA), and cerebral infarction without residual deficits: Secondary | ICD-10-CM | POA: Diagnosis not present

## 2023-04-02 DIAGNOSIS — M4628 Osteomyelitis of vertebra, sacral and sacrococcygeal region: Secondary | ICD-10-CM | POA: Insufficient documentation

## 2023-04-02 LAB — GLUCOSE, CAPILLARY
Glucose-Capillary: 177 mg/dL — ABNORMAL HIGH (ref 70–99)
Glucose-Capillary: 102 mg/dL — ABNORMAL HIGH (ref 70–99)

## 2023-04-03 ENCOUNTER — Encounter: Payer: Medicare Other | Admitting: Physician Assistant

## 2023-04-03 DIAGNOSIS — E11622 Type 2 diabetes mellitus with other skin ulcer: Secondary | ICD-10-CM | POA: Diagnosis not present

## 2023-04-03 LAB — GLUCOSE, CAPILLARY
Glucose-Capillary: 213 mg/dL — ABNORMAL HIGH (ref 70–99)
Glucose-Capillary: 161 mg/dL — ABNORMAL HIGH (ref 70–99)

## 2023-04-04 ENCOUNTER — Encounter: Payer: Medicare Other | Admitting: Physician Assistant

## 2023-04-04 DIAGNOSIS — E11622 Type 2 diabetes mellitus with other skin ulcer: Secondary | ICD-10-CM | POA: Diagnosis not present

## 2023-04-04 LAB — GLUCOSE, CAPILLARY
Glucose-Capillary: 179 mg/dL — ABNORMAL HIGH (ref 70–99)
Glucose-Capillary: 125 mg/dL — ABNORMAL HIGH (ref 70–99)

## 2023-04-05 ENCOUNTER — Encounter: Payer: Medicare Other | Admitting: Physician Assistant

## 2023-04-05 DIAGNOSIS — E11622 Type 2 diabetes mellitus with other skin ulcer: Secondary | ICD-10-CM | POA: Diagnosis not present

## 2023-04-05 LAB — GLUCOSE, CAPILLARY
Glucose-Capillary: 150 mg/dL — ABNORMAL HIGH (ref 70–99)
Glucose-Capillary: 147 mg/dL — ABNORMAL HIGH (ref 70–99)

## 2023-04-06 ENCOUNTER — Encounter: Payer: Medicare Other | Admitting: Physician Assistant

## 2023-04-09 ENCOUNTER — Encounter: Payer: Medicare Other | Admitting: Physician Assistant

## 2023-04-09 DIAGNOSIS — E11622 Type 2 diabetes mellitus with other skin ulcer: Secondary | ICD-10-CM | POA: Diagnosis not present

## 2023-04-09 LAB — GLUCOSE, CAPILLARY
Glucose-Capillary: 250 mg/dL — ABNORMAL HIGH (ref 70–99)
Glucose-Capillary: 129 mg/dL — ABNORMAL HIGH (ref 70–99)

## 2023-04-10 ENCOUNTER — Encounter: Payer: Medicare Other | Admitting: Physician Assistant

## 2023-04-10 DIAGNOSIS — E11622 Type 2 diabetes mellitus with other skin ulcer: Secondary | ICD-10-CM | POA: Diagnosis not present

## 2023-04-10 LAB — GLUCOSE, CAPILLARY
Glucose-Capillary: 205 mg/dL — ABNORMAL HIGH (ref 70–99)
Glucose-Capillary: 153 mg/dL — ABNORMAL HIGH (ref 70–99)

## 2023-04-11 ENCOUNTER — Encounter: Payer: Medicare Other | Admitting: Physician Assistant

## 2023-04-11 DIAGNOSIS — E11622 Type 2 diabetes mellitus with other skin ulcer: Secondary | ICD-10-CM | POA: Diagnosis not present

## 2023-04-11 LAB — GLUCOSE, CAPILLARY
Glucose-Capillary: 216 mg/dL — ABNORMAL HIGH (ref 70–99)
Glucose-Capillary: 158 mg/dL — ABNORMAL HIGH (ref 70–99)

## 2023-04-12 ENCOUNTER — Encounter: Payer: Medicare Other | Admitting: Physician Assistant

## 2023-04-12 DIAGNOSIS — E11622 Type 2 diabetes mellitus with other skin ulcer: Secondary | ICD-10-CM | POA: Diagnosis not present

## 2023-04-12 LAB — GLUCOSE, CAPILLARY
Glucose-Capillary: 144 mg/dL — ABNORMAL HIGH (ref 70–99)
Glucose-Capillary: 170 mg/dL — ABNORMAL HIGH (ref 70–99)

## 2023-04-13 ENCOUNTER — Encounter: Payer: Medicare Other | Admitting: Physician Assistant

## 2023-04-16 ENCOUNTER — Encounter: Payer: Medicare Other | Admitting: Physician Assistant

## 2023-04-16 DIAGNOSIS — E11622 Type 2 diabetes mellitus with other skin ulcer: Secondary | ICD-10-CM | POA: Diagnosis not present

## 2023-04-16 LAB — GLUCOSE, CAPILLARY
Glucose-Capillary: 222 mg/dL — ABNORMAL HIGH (ref 70–99)
Glucose-Capillary: 167 mg/dL — ABNORMAL HIGH (ref 70–99)

## 2023-04-17 ENCOUNTER — Encounter: Payer: Medicare Other | Admitting: Physician Assistant

## 2023-04-17 ENCOUNTER — Other Ambulatory Visit
Admission: RE | Admit: 2023-04-17 | Discharge: 2023-04-17 | Disposition: A | Discharge status: Home / Self Care | Payer: Medicare Other | Attending: Physician Assistant | Admitting: Physician Assistant

## 2023-04-17 DIAGNOSIS — M8638 Chronic multifocal osteomyelitis, other site: Secondary | ICD-10-CM | POA: Insufficient documentation

## 2023-04-17 DIAGNOSIS — E11622 Type 2 diabetes mellitus with other skin ulcer: Secondary | ICD-10-CM | POA: Diagnosis not present

## 2023-04-17 LAB — GLUCOSE, CAPILLARY
Glucose-Capillary: 205 mg/dL — ABNORMAL HIGH (ref 70–99)
Glucose-Capillary: 153 mg/dL — ABNORMAL HIGH (ref 70–99)

## 2023-04-17 LAB — CBC WITH DIFFERENTIAL/PLATELET
Abs Immature Granulocytes: 0.02 10*3/uL (ref 0.00–0.07)
Basophils Absolute: 0.1 10*3/uL (ref 0.0–0.1)
Basophils Relative: 1 %
Eosinophils Absolute: 0.4 10*3/uL (ref 0.0–0.5)
Eosinophils Relative: 4 %
HCT: 35.3 % — ABNORMAL LOW (ref 36.0–46.0)
Hemoglobin: 11.3 g/dL — ABNORMAL LOW (ref 12.0–15.0)
Immature Granulocytes: 0 %
Lymphocytes Relative: 23 %
Lymphs Abs: 2 10*3/uL (ref 0.7–4.0)
MCH: 29.9 pg (ref 26.0–34.0)
MCHC: 32 g/dL (ref 30.0–36.0)
MCV: 93.4 fL (ref 80.0–100.0)
Monocytes Absolute: 0.6 10*3/uL (ref 0.1–1.0)
Monocytes Relative: 7 %
Neutro Abs: 5.8 10*3/uL (ref 1.7–7.7)
Neutrophils Relative %: 65 %
Platelets: 211 10*3/uL (ref 150–400)
RBC: 3.78 MIL/uL — ABNORMAL LOW (ref 3.87–5.11)
RDW: 16.5 % — ABNORMAL HIGH (ref 11.5–15.5)
WBC: 8.9 10*3/uL (ref 4.0–10.5)
nRBC: 0 % (ref 0.0–0.2)

## 2023-04-17 LAB — SEDIMENTATION RATE: Sed Rate: 52 mm/h — ABNORMAL HIGH (ref 0–30)

## 2023-04-17 LAB — COMPREHENSIVE METABOLIC PANEL
ALT: 23 U/L (ref 0–44)
AST: 23 U/L (ref 15–41)
Albumin: 3.5 g/dL (ref 3.5–5.0)
Alkaline Phosphatase: 73 U/L (ref 38–126)
Anion gap: 12 (ref 5–15)
BUN: 48 mg/dL — ABNORMAL HIGH (ref 8–23)
CO2: 27 mmol/L (ref 22–32)
Calcium: 9 mg/dL (ref 8.9–10.3)
Chloride: 100 mmol/L (ref 98–111)
Creatinine, Ser: 1.66 mg/dL — ABNORMAL HIGH (ref 0.44–1.00)
GFR, Estimated: 29 mL/min — ABNORMAL LOW (ref >60)
Glucose, Bld: 241 mg/dL — ABNORMAL HIGH (ref 70–99)
Potassium: 4.4 mmol/L (ref 3.5–5.1)
Sodium: 139 mmol/L (ref 135–145)
Total Bilirubin: 0.6 mg/dL (ref 0.0–1.2)
Total Protein: 7.1 g/dL (ref 6.5–8.1)

## 2023-04-17 LAB — C-REACTIVE PROTEIN: CRP: 0.8 mg/dL (ref <1.0)

## 2023-04-18 ENCOUNTER — Encounter: Payer: Medicare Other | Admitting: Physician Assistant

## 2023-04-19 ENCOUNTER — Encounter: Payer: Medicare Other | Admitting: Physician Assistant

## 2023-04-20 ENCOUNTER — Encounter: Payer: Medicare Other | Admitting: Physician Assistant

## 2023-04-23 ENCOUNTER — Encounter: Payer: Medicare Other | Admitting: Physician Assistant

## 2023-04-23 DIAGNOSIS — E11622 Type 2 diabetes mellitus with other skin ulcer: Secondary | ICD-10-CM | POA: Diagnosis not present

## 2023-04-23 LAB — GLUCOSE, CAPILLARY
Glucose-Capillary: 247 mg/dL — ABNORMAL HIGH (ref 70–99)
Glucose-Capillary: 161 mg/dL — ABNORMAL HIGH (ref 70–99)

## 2023-04-24 ENCOUNTER — Encounter: Payer: Medicare Other | Admitting: Physician Assistant

## 2023-04-24 DIAGNOSIS — E11622 Type 2 diabetes mellitus with other skin ulcer: Secondary | ICD-10-CM | POA: Diagnosis not present

## 2023-04-24 LAB — GLUCOSE, CAPILLARY
Glucose-Capillary: 235 mg/dL — ABNORMAL HIGH (ref 70–99)
Glucose-Capillary: 188 mg/dL — ABNORMAL HIGH (ref 70–99)

## 2023-04-25 ENCOUNTER — Encounter: Payer: Medicare Other | Admitting: Physician Assistant

## 2023-04-25 DIAGNOSIS — E11622 Type 2 diabetes mellitus with other skin ulcer: Secondary | ICD-10-CM | POA: Diagnosis not present

## 2023-04-25 LAB — GLUCOSE, CAPILLARY
Glucose-Capillary: 197 mg/dL — ABNORMAL HIGH (ref 70–99)
Glucose-Capillary: 141 mg/dL — ABNORMAL HIGH (ref 70–99)

## 2023-04-26 ENCOUNTER — Encounter: Payer: Medicare Other | Admitting: Physician Assistant

## 2023-04-26 DIAGNOSIS — E11622 Type 2 diabetes mellitus with other skin ulcer: Secondary | ICD-10-CM | POA: Diagnosis not present

## 2023-04-26 LAB — GLUCOSE, CAPILLARY
Glucose-Capillary: 231 mg/dL — ABNORMAL HIGH (ref 70–99)
Glucose-Capillary: 159 mg/dL — ABNORMAL HIGH (ref 70–99)

## 2023-04-27 ENCOUNTER — Encounter: Payer: Medicare Other | Admitting: Physician Assistant

## 2023-04-30 ENCOUNTER — Encounter: Payer: Medicare Other | Attending: Physician Assistant | Admitting: Physician Assistant

## 2023-04-30 DIAGNOSIS — N183 Chronic kidney disease, stage 3 unspecified: Secondary | ICD-10-CM | POA: Diagnosis not present

## 2023-04-30 DIAGNOSIS — E1122 Type 2 diabetes mellitus with diabetic chronic kidney disease: Secondary | ICD-10-CM | POA: Diagnosis not present

## 2023-04-30 DIAGNOSIS — M8668 Other chronic osteomyelitis, other site: Secondary | ICD-10-CM | POA: Diagnosis not present

## 2023-04-30 DIAGNOSIS — L98416 Non-pressure chronic ulcer of buttock with bone involvement without evidence of necrosis: Secondary | ICD-10-CM | POA: Diagnosis not present

## 2023-04-30 DIAGNOSIS — T8131XA Disruption of external operation (surgical) wound, not elsewhere classified, initial encounter: Secondary | ICD-10-CM | POA: Diagnosis present

## 2023-04-30 DIAGNOSIS — I130 Hypertensive heart and chronic kidney disease with heart failure and stage 1 through stage 4 chronic kidney disease, or unspecified chronic kidney disease: Secondary | ICD-10-CM | POA: Diagnosis not present

## 2023-04-30 DIAGNOSIS — S81821A Laceration with foreign body, right lower leg, initial encounter: Secondary | ICD-10-CM | POA: Diagnosis not present

## 2023-04-30 DIAGNOSIS — E11622 Type 2 diabetes mellitus with other skin ulcer: Secondary | ICD-10-CM | POA: Diagnosis not present

## 2023-04-30 DIAGNOSIS — I5042 Chronic combined systolic (congestive) and diastolic (congestive) heart failure: Secondary | ICD-10-CM | POA: Diagnosis not present

## 2023-04-30 LAB — GLUCOSE, CAPILLARY
Glucose-Capillary: 221 mg/dL — ABNORMAL HIGH (ref 70–99)
Glucose-Capillary: 126 mg/dL — ABNORMAL HIGH (ref 70–99)

## 2023-05-01 ENCOUNTER — Encounter: Payer: Medicare Other | Admitting: Physician Assistant

## 2023-05-01 DIAGNOSIS — T8131XA Disruption of external operation (surgical) wound, not elsewhere classified, initial encounter: Secondary | ICD-10-CM | POA: Diagnosis not present

## 2023-05-01 LAB — GLUCOSE, CAPILLARY
Glucose-Capillary: 251 mg/dL — ABNORMAL HIGH (ref 70–99)
Glucose-Capillary: 200 mg/dL — ABNORMAL HIGH (ref 70–99)

## 2023-05-02 ENCOUNTER — Encounter: Payer: Medicare Other | Admitting: Physician Assistant

## 2023-05-02 LAB — GLUCOSE, CAPILLARY
Glucose-Capillary: 183 mg/dL — ABNORMAL HIGH (ref 70–99)
Glucose-Capillary: 81 mg/dL (ref 70–99)

## 2023-05-03 ENCOUNTER — Other Ambulatory Visit
Admission: RE | Admit: 2023-05-03 | Discharge: 2023-05-03 | Disposition: A | Discharge status: Home / Self Care | Source: Ambulatory Visit | Attending: Physician Assistant | Admitting: Physician Assistant

## 2023-05-03 ENCOUNTER — Encounter: Payer: Medicare Other | Admitting: Physician Assistant

## 2023-05-03 DIAGNOSIS — T8131XA Disruption of external operation (surgical) wound, not elsewhere classified, initial encounter: Secondary | ICD-10-CM | POA: Diagnosis not present

## 2023-05-03 DIAGNOSIS — M8638 Chronic multifocal osteomyelitis, other site: Secondary | ICD-10-CM | POA: Diagnosis present

## 2023-05-03 LAB — CBC WITH DIFFERENTIAL/PLATELET
Abs Immature Granulocytes: 0.01 10*3/uL (ref 0.00–0.07)
Basophils Absolute: 0 10*3/uL (ref 0.0–0.1)
Basophils Relative: 1 %
Eosinophils Absolute: 0.3 10*3/uL (ref 0.0–0.5)
Eosinophils Relative: 5 %
HCT: 34.1 % — ABNORMAL LOW (ref 36.0–46.0)
Hemoglobin: 11 g/dL — ABNORMAL LOW (ref 12.0–15.0)
Immature Granulocytes: 0 %
Lymphocytes Relative: 26 %
Lymphs Abs: 1.7 10*3/uL (ref 0.7–4.0)
MCH: 30.6 pg (ref 26.0–34.0)
MCHC: 32.3 g/dL (ref 30.0–36.0)
MCV: 94.7 fL (ref 80.0–100.0)
Monocytes Absolute: 0.7 10*3/uL (ref 0.1–1.0)
Monocytes Relative: 10 %
Neutro Abs: 3.8 10*3/uL (ref 1.7–7.7)
Neutrophils Relative %: 58 %
Platelets: 190 10*3/uL (ref 150–400)
RBC: 3.6 MIL/uL — ABNORMAL LOW (ref 3.87–5.11)
RDW: 14.9 % (ref 11.5–15.5)
WBC: 6.5 10*3/uL (ref 4.0–10.5)
nRBC: 0 % (ref 0.0–0.2)

## 2023-05-03 LAB — GLUCOSE, CAPILLARY: Glucose-Capillary: 235 mg/dL — ABNORMAL HIGH (ref 70–99)

## 2023-05-03 LAB — SEDIMENTATION RATE: Sed Rate: 54 mm/h — ABNORMAL HIGH (ref 0–30)

## 2023-05-04 LAB — GLUCOSE, CAPILLARY: Glucose-Capillary: 150 mg/dL — ABNORMAL HIGH (ref 70–99)

## 2023-05-07 ENCOUNTER — Encounter: Payer: Medicare Other | Admitting: Physician Assistant

## 2023-05-07 DIAGNOSIS — T8131XA Disruption of external operation (surgical) wound, not elsewhere classified, initial encounter: Secondary | ICD-10-CM | POA: Diagnosis not present

## 2023-05-07 LAB — GLUCOSE, CAPILLARY
Glucose-Capillary: 162 mg/dL — ABNORMAL HIGH (ref 70–99)
Glucose-Capillary: 99 mg/dL (ref 70–99)

## 2023-05-08 ENCOUNTER — Ambulatory Visit: Payer: Medicare Other | Admitting: Physician Assistant

## 2023-05-08 ENCOUNTER — Encounter: Admitting: Physician Assistant

## 2023-05-08 DIAGNOSIS — T8131XA Disruption of external operation (surgical) wound, not elsewhere classified, initial encounter: Secondary | ICD-10-CM | POA: Diagnosis not present

## 2023-05-08 LAB — GLUCOSE, CAPILLARY
Glucose-Capillary: 200 mg/dL — ABNORMAL HIGH (ref 70–99)
Glucose-Capillary: 200 mg/dL — ABNORMAL HIGH (ref 70–99)

## 2023-05-09 ENCOUNTER — Encounter: Payer: Medicare Other | Admitting: Physician Assistant

## 2023-05-09 DIAGNOSIS — T8131XA Disruption of external operation (surgical) wound, not elsewhere classified, initial encounter: Secondary | ICD-10-CM | POA: Diagnosis not present

## 2023-05-09 LAB — GLUCOSE, CAPILLARY
Glucose-Capillary: 189 mg/dL — ABNORMAL HIGH (ref 70–99)
Glucose-Capillary: 126 mg/dL — ABNORMAL HIGH (ref 70–99)

## 2023-05-10 ENCOUNTER — Encounter: Payer: Medicare Other | Admitting: Physician Assistant

## 2023-05-10 DIAGNOSIS — T8131XA Disruption of external operation (surgical) wound, not elsewhere classified, initial encounter: Secondary | ICD-10-CM | POA: Diagnosis not present

## 2023-05-10 LAB — GLUCOSE, CAPILLARY
Glucose-Capillary: 253 mg/dL — ABNORMAL HIGH (ref 70–99)
Glucose-Capillary: 215 mg/dL — ABNORMAL HIGH (ref 70–99)

## 2023-05-14 ENCOUNTER — Encounter: Payer: Medicare Other | Admitting: Physician Assistant

## 2023-05-14 DIAGNOSIS — T8131XA Disruption of external operation (surgical) wound, not elsewhere classified, initial encounter: Secondary | ICD-10-CM | POA: Diagnosis not present

## 2023-05-14 LAB — GLUCOSE, CAPILLARY
Glucose-Capillary: 272 mg/dL — ABNORMAL HIGH (ref 70–99)
Glucose-Capillary: 197 mg/dL — ABNORMAL HIGH (ref 70–99)

## 2023-05-15 ENCOUNTER — Encounter: Admitting: Physician Assistant

## 2023-05-15 DIAGNOSIS — T8131XA Disruption of external operation (surgical) wound, not elsewhere classified, initial encounter: Secondary | ICD-10-CM | POA: Diagnosis not present

## 2023-05-15 LAB — GLUCOSE, CAPILLARY
Glucose-Capillary: 180 mg/dL — ABNORMAL HIGH (ref 70–99)
Glucose-Capillary: 156 mg/dL — ABNORMAL HIGH (ref 70–99)

## 2023-05-16 ENCOUNTER — Encounter: Payer: Medicare Other | Admitting: Physician Assistant

## 2023-05-17 ENCOUNTER — Encounter: Payer: Medicare Other | Admitting: Physician Assistant

## 2023-05-21 ENCOUNTER — Encounter: Payer: Medicare Other | Admitting: Physician Assistant

## 2023-05-22 ENCOUNTER — Encounter: Admitting: Physician Assistant

## 2023-05-23 ENCOUNTER — Encounter: Payer: Medicare Other | Admitting: Physician Assistant

## 2023-05-24 ENCOUNTER — Encounter: Payer: Medicare Other | Admitting: Physician Assistant

## 2023-05-28 ENCOUNTER — Encounter: Payer: Medicare Other | Admitting: Physician Assistant

## 2023-05-29 ENCOUNTER — Encounter: Admitting: Physician Assistant

## 2023-05-30 ENCOUNTER — Encounter: Payer: Medicare Other | Admitting: Physician Assistant

## 2023-05-31 ENCOUNTER — Encounter: Payer: Medicare Other | Admitting: Physician Assistant

## 2023-06-08 ENCOUNTER — Encounter: Attending: Physician Assistant | Admitting: Physician Assistant

## 2023-06-08 DIAGNOSIS — T8131XA Disruption of external operation (surgical) wound, not elsewhere classified, initial encounter: Secondary | ICD-10-CM | POA: Diagnosis present

## 2023-06-08 DIAGNOSIS — S81811A Laceration without foreign body, right lower leg, initial encounter: Secondary | ICD-10-CM | POA: Diagnosis not present

## 2023-06-08 DIAGNOSIS — X58XXXA Exposure to other specified factors, initial encounter: Secondary | ICD-10-CM | POA: Diagnosis not present

## 2023-06-08 DIAGNOSIS — N183 Chronic kidney disease, stage 3 unspecified: Secondary | ICD-10-CM | POA: Insufficient documentation

## 2023-06-08 DIAGNOSIS — E11622 Type 2 diabetes mellitus with other skin ulcer: Secondary | ICD-10-CM | POA: Diagnosis not present

## 2023-06-08 DIAGNOSIS — I5042 Chronic combined systolic (congestive) and diastolic (congestive) heart failure: Secondary | ICD-10-CM | POA: Insufficient documentation

## 2023-06-08 DIAGNOSIS — I130 Hypertensive heart and chronic kidney disease with heart failure and stage 1 through stage 4 chronic kidney disease, or unspecified chronic kidney disease: Secondary | ICD-10-CM | POA: Insufficient documentation

## 2023-06-08 DIAGNOSIS — L98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: Secondary | ICD-10-CM | POA: Diagnosis not present

## 2023-06-08 DIAGNOSIS — M8638 Chronic multifocal osteomyelitis, other site: Secondary | ICD-10-CM | POA: Diagnosis not present

## 2023-06-20 ENCOUNTER — Ambulatory Visit: Admitting: Physician Assistant

## 2023-07-26 ENCOUNTER — Encounter: Payer: Self-pay | Admitting: Cardiology

## 2023-07-26 ENCOUNTER — Ambulatory Visit: Payer: Medicare Other | Attending: Cardiology | Admitting: Cardiology

## 2023-07-26 VITALS — BP 142/56 | HR 71 | Ht 66.0 in | Wt 178.4 lb

## 2023-07-26 DIAGNOSIS — I493 Ventricular premature depolarization: Secondary | ICD-10-CM | POA: Diagnosis not present

## 2023-07-26 DIAGNOSIS — I10 Essential (primary) hypertension: Secondary | ICD-10-CM

## 2023-07-26 DIAGNOSIS — I5032 Chronic diastolic (congestive) heart failure: Secondary | ICD-10-CM | POA: Diagnosis not present

## 2023-07-26 DIAGNOSIS — N183 Chronic kidney disease, stage 3 unspecified: Secondary | ICD-10-CM | POA: Diagnosis not present

## 2023-07-26 DIAGNOSIS — Z79899 Other long term (current) drug therapy: Secondary | ICD-10-CM

## 2023-07-26 NOTE — Patient Instructions (Signed)
 Medication Instructions:  Your physician recommends that you continue on your current medications as directed. Please refer to the Current Medication list given to you today.  *If you need a refill on your cardiac medications before your next appointment, please call your pharmacy*  Lab Work: CMET, Mag If you have labs (blood work) drawn today and your tests are completely normal, you will receive your results only by: MyChart Message (if you have MyChart) OR A paper copy in the mail If you have any lab test that is abnormal or we need to change your treatment, we will call you to review the results.   Follow-Up: At [facility], you and your health needs are our priority.  As part of our continuing mission to provide you with exceptional heart care, our providers are all part of one team.  This team includes your primary Cardiologist (physician) and Advanced Practice Providers or APPs (Physician Assistants and Nurse Practitioners) who all work together to provide you with the care you need, when you need it.  Your next appointment:   4 month(s)  Provider:   Kardie Tobb, DO

## 2023-07-27 LAB — COMPREHENSIVE METABOLIC PANEL WITH GFR
ALT: 47 IU/L — ABNORMAL HIGH (ref 0–32)
AST: 34 IU/L (ref 0–40)
Albumin: 4.5 g/dL (ref 3.7–4.7)
Alkaline Phosphatase: 112 IU/L (ref 44–121)
BUN/Creatinine Ratio: 35 — ABNORMAL HIGH (ref 12–28)
BUN: 85 mg/dL (ref 8–27)
Bilirubin Total: 0.5 mg/dL (ref 0.0–1.2)
CO2: 26 mmol/L (ref 20–29)
Calcium: 9.8 mg/dL (ref 8.7–10.3)
Chloride: 98 mmol/L (ref 96–106)
Creatinine, Ser: 2.4 mg/dL — ABNORMAL HIGH (ref 0.57–1.00)
Globulin, Total: 2.8 g/dL (ref 1.5–4.5)
Glucose: 174 mg/dL — ABNORMAL HIGH (ref 70–99)
Potassium: 5 mmol/L (ref 3.5–5.2)
Sodium: 142 mmol/L (ref 134–144)
Total Protein: 7.3 g/dL (ref 6.0–8.5)
eGFR: 19 mL/min/{1.73_m2} — ABNORMAL LOW (ref >59)

## 2023-07-27 LAB — MAGNESIUM: Magnesium: 2.1 mg/dL (ref 1.6–2.3)

## 2023-08-04 NOTE — Progress Notes (Signed)
 Cardiology Office Note:    Date:  08/04/2023   ID:  Liat, Mayol 02-Oct-1932, MRN [id number]  PCP:  Alfredia Ina, MD  Cardiologist:  Jerryl Morin, DO  Electrophysiologist:  None   Referring MD: Alfredia Ina, MD   " I am ok"  History of Present Illness:    [name] [name] is a 89 y.o. female with a hx of chronic diastolic hypertension, diabetes mellitus type 2, CVA, dyslipidemia, CKD 3B, obesity, hypertension here today for follow-up visit.  She is her with her daughter and doing well. No complaints today.   Past Medical History:  Diagnosis Date   Colon polyp    Diabetes mellitus 1992   Hypertension 1996   Pancreatitis     Past Surgical History:  Procedure Laterality Date   CHOLECYSTECTOMY     COLONOSCOPY     ERCP     INCISION AND DRAINAGE ABSCESS N/A 09/12/2022   Procedure: INCISION AND DRAINAGE ABSCESS;  Surgeon: Candyce Champagne, MD;  Location: [facility];  Service: General;  Laterality: N/A;   PARTIAL HYSTERECTOMY  89years old   POLYPECTOMY     UPPER GASTROINTESTINAL ENDOSCOPY     WRIST FRACTURE SURGERY  left arm,89years old    Current Medications: Current Meds  Medication Sig   acetaminophen  (TYLENOL ) 500 MG tablet Take 1,000 mg by mouth 2 (two) times daily.   albuterol  (PROVENTIL ) (2.5 MG/3ML) 0.083% nebulizer solution Take 3 mLs (2.5 mg total) by nebulization every 4 (four) hours as needed for wheezing or shortness of breath (per home regimen).   albuterol  (VENTOLIN  HFA) 108 (90 Base) MCG/ACT inhaler Inhale 2 puffs into the lungs every 6 (six) hours as needed for wheezing or shortness of breath.   amantadine  (SYMMETREL ) 100 MG capsule Take 1 capsule (100 mg total) by mouth daily.   ASPIRIN  81 PO Take 81 mg by mouth in the morning.   atorvastatin  (LIPITOR ) 40 MG tablet Take 1 tablet (40 mg total) by mouth daily.   B Complex-C (B-COMPLEX WITH VITAMIN C ) tablet Take 1 tablet by mouth daily.   bisacodyl  5 MG EC tablet Take 5 mg by mouth daily as needed for  moderate constipation.   diclofenac  Sodium (VOLTAREN  ARTHRITIS PAIN) 1 % GEL Apply 2 g topically 4 (four) times daily. (Patient taking differently: Apply 4 g topically 3 (three) times daily. Right knee)   ferrous sulfate 325 (65 FE) MG tablet Take 325 mg by mouth every other day.   Glucerna (GLUCERNA) LIQD Take 237 mLs by mouth daily.   glucose blood (ACCU-CHEK GUIDE) test strip 1 each by Other route in the morning, at noon, in the evening, and at bedtime.   hydrALAZINE  (APRESOLINE ) 100 MG tablet Take 1 tablet (100 mg total) by mouth 2 (two) times daily.   insulin  lispro (HUMALOG) 100 UNIT/ML injection Inject 14-26 Units into the skin 4 (four) times daily.   Insulin  Syringe-Needle U-100 31G X 5/16" 1 ML MISC 1 application by Does not apply route 2 (two) times daily.   ipratropium-albuterol  (DUONEB) 0.5-2.5 (3) MG/3ML SOLN Take 3 mLs by nebulization as needed.   LANTUS  SOLOSTAR 100 UNIT/ML Solostar Pen Inject 10 Units into the skin 2 (two) times daily. (Patient taking differently: Inject 19 Units into the skin 2 (two) times daily.)   lidocaine  (LIDODERM ) 5 % APPLY (1) PATCH TO LOWER BACK AND RIGHT KNEE ONCE DAILY. LEAVE ON 12 HOURS. LEAVE OFF 12 HOURS. (Patient taking differently: Place 1 patch onto the skin See admin instructions. Apply  1 patch to the lower back and right knee once a day- on 12 hours/off 12 hours)   loratadine (CLARITIN) 10 MG tablet Take 10 mg by mouth daily.   methocarbamol  (ROBAXIN ) 500 MG tablet Take 250 mg by mouth at bedtime as needed for muscle spasms.   polyethylene glycol (MIRALAX  / GLYCOLAX ) 17 g packet Take 17 g by mouth 2 (two) times daily as needed for mild constipation or moderate constipation.   QUEtiapine  (SEROQUEL ) 25 MG tablet Take 0.5 tablets (12.5 mg total) by mouth at bedtime as needed (anxiety).   senna-docusate (SENOKOT-S) 8.6-50 MG tablet Take 1 tablet by mouth 2 (two) times daily.   traMADol (ULTRAM) 50 MG tablet Take 50 mg by mouth at bedtime as needed for  moderate pain.   traMADol-acetaminophen  (ULTRACET) 37.5-325 MG tablet Take 1 tablet by mouth 2 (two) times daily.     Allergies:   Amlodipine , Ace inhibitors, Atorvastatin , Canagliflozin, Clonidine  hcl, Buprenorphine, Lisinopril, and Morphine and codeine   Social History   Socioeconomic History   Marital status: Widowed    Spouse name: Not on file   Number of children: Not on file   Years of education: Not on file   Highest education level: Not on file  Occupational History   Not on file  Tobacco Use   Smoking status: Never   Smokeless tobacco: Never  Vaping Use   Vaping status: Never Used  Substance and Sexual Activity   Alcohol use: Never   Drug use: Never   Sexual activity: Not on file  Other Topics Concern   Not on file  Social History Narrative   Not on file   Social Drivers of Health   Financial Resource Strain: Low Risk  (06/04/2023)   Received from [facility]   Overall Financial Resource Strain (CARDIA)    Difficulty of Paying Living Expenses: Not hard at all  Food Insecurity: No Food Insecurity (06/04/2023)   Received from [facility]   Hunger Vital Sign    Worried About Running Out of Food in the Last Year: Never true    Ran Out of Food in the Last Year: Never true  Transportation Needs: No Transportation Needs (06/04/2023)   Received from [facility] - Transportation    Lack of Transportation (Medical): No    Lack of Transportation (Non-Medical): No  Physical Activity: Unknown (06/04/2023)   Received from [facility]   Exercise Vital Sign    Days of Exercise per Week: 0 days    Minutes of Exercise per Session: Not on file  Stress: No Stress Concern Present (06/04/2023)   Received from [facility] - Occupational Stress Questionnaire    Feeling of Stress : Only a little  Social Connections: Socially Integrated (06/04/2023)   Received from [facility]   Social Network    How would you rate your  social network (family, work, friends)?: Good participation with social networks     Family History: The patient's family history is not on file.  ROS:   Review of Systems  Constitution: Negative for decreased appetite, fever and weight gain.  HENT: Negative for congestion, ear discharge, hoarse voice and sore throat.   Eyes: Negative for discharge, redness, vision loss in right eye and visual halos.  Cardiovascular: Negative for chest pain, dyspnea on exertion, leg swelling, orthopnea and palpitations.  Respiratory: Negative for cough, hemoptysis, shortness of breath and snoring.   Endocrine: Negative for heat intolerance and  polyphagia.  Hematologic/Lymphatic: Negative for bleeding problem. Does not bruise/bleed easily.  Skin: Negative for flushing, nail changes, rash and suspicious lesions.  Musculoskeletal: Negative for arthritis, joint pain, muscle cramps, myalgias, neck pain and stiffness.  Gastrointestinal: Negative for abdominal pain, bowel incontinence, diarrhea and excessive appetite.  Genitourinary: Negative for decreased libido, genital sores and incomplete emptying.  Neurological: Negative for brief paralysis, focal weakness, headaches and loss of balance.  Psychiatric/Behavioral: Negative for altered mental status, depression and suicidal ideas.  Allergic/Immunologic: Negative for HIV exposure and persistent infections.    EKGs/Labs/Other Studies Reviewed:    The following studies were reviewed today:   EKG:  ECG showed sinus rhythm with occasional premature ventricular complex  Recent Labs: 11/03/2022: B Natriuretic Peptide 440.7 05/03/2023: Hemoglobin 11.0; Platelets 190 07/26/2023: ALT 47; BUN 85; Creatinine, Ser 2.40; Magnesium  2.1; Potassium 5.0; Sodium 142  Recent Lipid Panel    Component Value Date/Time   CHOL 201 (H) 01/04/2020 1601   TRIG 398 (H) 01/06/2020 0512   HDL 37 (L) 01/04/2020 1601   CHOLHDL 5.4 01/04/2020 1601   VLDL UNABLE TO CALCULATE IF  TRIGLYCERIDE OVER 400 mg/dL 16/11/9602 5409   LDLCALC UNABLE TO CALCULATE IF TRIGLYCERIDE OVER 400 mg/dL 81/19/1478 2956   LDLDIRECT 121.8 (H) 01/04/2020 1601    Physical Exam:    VS:  BP (!) 142/56 (BP Location: Left Arm, Patient Position: Sitting, Cuff Size: Normal)   Pulse 71   Ht 5\' 6"  (1.676 m)   Wt 178 lb 6.4 oz (80.9 kg)   SpO2 95%   BMI 28.79 kg/m     Wt Readings from Last 3 Encounters:  07/26/23 178 lb 6.4 oz (80.9 kg)  03/28/23 177 lb 4.8 oz (80.4 kg)  12/27/22 186 lb 3.2 oz (84.5 kg)     GEN: Well nourished, well developed in no acute distress HEENT: Normal NECK: No JVD; No carotid bruits LYMPHATICS: No lymphadenopathy CARDIAC: S1S2 noted,RRR, no murmurs, rubs, gallops RESPIRATORY:  Clear to auscultation without rales, wheezing or rhonchi  ABDOMEN: Soft, non-tender, non-distended, +bowel sounds, no guarding. EXTREMITIES: No edema, No cyanosis, no clubbing MUSCULOSKELETAL:  No deformity  SKIN: Warm and dry NEUROLOGIC:  Alert and oriented x 3, non-focal PSYCHIATRIC:  Normal affect, good insight  ASSESSMENT:    1. Essential hypertension   2. Medication management   3. Chronic diastolic congestive heart failure (HCC)   4. Stage 3 chronic kidney disease, unspecified whether stage 3a or 3b CKD (HCC)   5. PVC (premature ventricular contraction)     PLAN:    Elevated creatinine - Creatinine level was 2.3 on April 9. Reassessment with today's blood work will determine if levels have changed. Potential diuretic influence on creatinine levels. - Order blood work to assess kidney function. - Adjust torsemide  dosage to every other day if creatinine level is higher than 2.3. - Discuss results with family once available.  Diuretic use Effective diuresis with current weight at 179 lbs, dry weight 186 lbs. Potential need for torsemide  adjustment if kidney function is impaired. - Monitor weight and kidney function. - Adjust torsemide  dosage based on kidney function  results.  Elevated liver enzymes Liver enzymes previously elevated, now trending downwards. Atorvastatin  unlikely cause. Further assessment with today's blood work. - Order blood work to assess liver enzymes.  PVC - ecg with some PVC noted.   Chronic diastolic heart failure -Clinically appeared to be euvolemic   -Monitor weight daily and adjust torsemide  dose as needed to maintain weight around 185 lbs. She  is maintaining her dry wright and sometimes lower  Hypertension-blood pressure is elevated. Per reported blood pressure at home it is normal.   Chronic kidney disease-avoid nephrotoxins.  The patient understands the need to lose weight with diet and exercise. We have discussed specific strategies for this.   The patient is in agreement with the above plan. The patient left the office in stable condition.  The patient will follow up in 4 months or sooner if needed.   Medication Adjustments/Labs and Tests Ordered: Current medicines are reviewed at length with the patient today.  Concerns regarding medicines are outlined above.  Orders Placed This Encounter  Procedures   Comp Met (CMET)   Magnesium    EKG 12-Lead   No orders of the defined types were placed in this encounter.   Patient Instructions  Medication Instructions:  Your physician recommends that you continue on your current medications as directed. Please refer to the Current Medication list given to you today.  *If you need a refill on your cardiac medications before your next appointment, please call your pharmacy*  Lab Work: CMET, Mag If you have labs (blood work) drawn today and your tests are completely normal, you will receive your results only by: MyChart Message (if you have MyChart) OR A paper copy in the mail If you have any lab test that is abnormal or we need to change your treatment, we will call you to review the results.   Follow-Up: At [facility], you and your health needs are our  priority.  As part of our continuing mission to provide you with exceptional heart care, our providers are all part of one team.  This team includes your primary Cardiologist (physician) and Advanced Practice Providers or APPs (Physician Assistants and Nurse Practitioners) who all work together to provide you with the care you need, when you need it.  Your next appointment:   4 month(s)  Provider:   Tareka Jhaveri, DO      Adopting a Healthy Lifestyle.  Know what a healthy weight is for you (roughly BMI <25) and aim to maintain this   Aim for 7+ servings of fruits and vegetables daily   65-80+ fluid ounces of water  or unsweet tea for healthy kidneys   Limit to max 1 drink of alcohol per day; avoid smoking/tobacco   Limit animal fats in diet for cholesterol and heart health - choose grass fed whenever available   Avoid highly processed foods, and foods high in saturated/trans fats   Aim for low stress - take time to unwind and care for your mental health   Aim for 150 min of moderate intensity exercise weekly for heart health, and weights twice weekly for bone health   Aim for 7-9 hours of sleep daily   When it comes to diets, agreement about the perfect plan isnt easy to find, even among the experts. Experts at the [facility] [employer] developed an idea known as the Healthy Eating Plate. Just imagine a plate divided into logical, healthy portions.   The emphasis is on diet quality:   Load up on vegetables and fruits - one-half of your plate: Aim for color and variety, and remember that potatoes dont count.   Go for whole grains - one-quarter of your plate: Whole wheat, barley, wheat berries, quinoa, oats, brown rice, and foods made with them. If you want pasta, go with whole wheat pasta.   Protein power - one-quarter of your plate: Fish, chicken, beans,  and nuts are all healthy, versatile protein sources. Limit red meat.   The diet, however, does go beyond the plate,  offering a few other suggestions.   Use healthy plant oils, such as olive, canola, soy, corn, sunflower and peanut. Check the labels, and avoid partially hydrogenated oil, which have unhealthy trans fats.   If youre thirsty, drink water . Coffee and tea are good in moderation, but skip sugary drinks and limit milk and dairy products to one or two daily servings.   The type of carbohydrate in the diet is more important than the amount. Some sources of carbohydrates, such as vegetables, fruits, whole grains, and beans-are healthier than others.   Finally, stay active  Signed, Dorianne Perret, DO  08/04/2023 8:13 PM    [facility]

## 2023-08-07 ENCOUNTER — Encounter: Payer: Self-pay | Admitting: Cardiology

## 2023-08-08 ENCOUNTER — Other Ambulatory Visit: Payer: Self-pay

## 2023-08-08 DIAGNOSIS — R899 Unspecified abnormal finding in specimens from other organs, systems and tissues: Secondary | ICD-10-CM

## 2023-08-08 DIAGNOSIS — N1832 Chronic kidney disease, stage 3b: Secondary | ICD-10-CM

## 2023-08-08 DIAGNOSIS — N183 Chronic kidney disease, stage 3 unspecified: Secondary | ICD-10-CM

## 2023-08-08 MED ORDER — TORSEMIDE 20 MG PO TABS: 20.0000 mg | ORAL_TABLET | ORAL | Status: DC

## 2023-08-08 NOTE — Addendum Note (Signed)
 Addended by: Jeneen Mire on: 08/08/2023 01:56 PM   Modules accepted: Orders

## 2023-08-08 NOTE — Progress Notes (Signed)
 Medication list updated. Referral placed per Dr. Ryan Coyer request.

## 2023-08-10 ENCOUNTER — Ambulatory Visit: Payer: Self-pay | Admitting: Cardiology

## 2023-08-17 ENCOUNTER — Encounter: Payer: Self-pay | Admitting: Cardiology

## 2023-08-17 DIAGNOSIS — Z79899 Other long term (current) drug therapy: Secondary | ICD-10-CM

## 2023-08-17 DIAGNOSIS — N1832 Chronic kidney disease, stage 3b: Secondary | ICD-10-CM

## 2023-08-17 NOTE — Telephone Encounter (Signed)
 Gave this information to Dr Lavonne Prairie- DOD- said to order BMET- we need those results before we can make any changes to this patient's medication.  Called Aden Agreste back and informed her about the information above. She will bring the patient to the lab here at [address] [facility].   Informed her that changes may not be made to the pt's medications until Monday- due to the labs being drawn on Friday and we are not at the office over the weekend. She verbalized understanding.   Given ER precautions, she verbalized understanding.

## 2023-08-17 NOTE — Addendum Note (Signed)
 Addended by: Bernetha Anschutz L on: 08/17/2023 03:08 PM   Modules accepted: Orders

## 2023-08-17 NOTE — Telephone Encounter (Signed)
 No shortness of breath. Have not heard back from Dr Christianne Cowper.

## 2023-08-18 LAB — BASIC METABOLIC PANEL WITH GFR
BUN/Creatinine Ratio: 30 — ABNORMAL HIGH (ref 12–28)
BUN: 59 mg/dL — ABNORMAL HIGH (ref 8–27)
CO2: 23 mmol/L (ref 20–29)
Calcium: 9.2 mg/dL (ref 8.7–10.3)
Chloride: 102 mmol/L (ref 96–106)
Creatinine, Ser: 1.95 mg/dL — ABNORMAL HIGH (ref 0.57–1.00)
Glucose: 201 mg/dL — ABNORMAL HIGH (ref 70–99)
Potassium: 5.1 mmol/L (ref 3.5–5.2)
Sodium: 142 mmol/L (ref 134–144)
eGFR: 24 mL/min/{1.73_m2} — ABNORMAL LOW (ref >59)

## 2023-10-15 ENCOUNTER — Emergency Department (HOSPITAL_BASED_OUTPATIENT_CLINIC_OR_DEPARTMENT_OTHER)
Admission: EM | Admit: 2023-10-15 | Discharge: 2023-10-15 | Disposition: A | Discharge status: Home / Self Care | Source: Ambulatory Visit | Attending: Emergency Medicine | Admitting: Emergency Medicine

## 2023-10-15 ENCOUNTER — Encounter: Payer: Self-pay | Admitting: *Deleted

## 2023-10-15 ENCOUNTER — Encounter (HOSPITAL_BASED_OUTPATIENT_CLINIC_OR_DEPARTMENT_OTHER): Payer: Self-pay | Admitting: Emergency Medicine

## 2023-10-15 ENCOUNTER — Telehealth: Payer: Self-pay | Admitting: Cardiology

## 2023-10-15 ENCOUNTER — Other Ambulatory Visit: Payer: Self-pay

## 2023-10-15 DIAGNOSIS — Z794 Long term (current) use of insulin: Secondary | ICD-10-CM | POA: Insufficient documentation

## 2023-10-15 DIAGNOSIS — I129 Hypertensive chronic kidney disease with stage 1 through stage 4 chronic kidney disease, or unspecified chronic kidney disease: Secondary | ICD-10-CM | POA: Insufficient documentation

## 2023-10-15 DIAGNOSIS — E1122 Type 2 diabetes mellitus with diabetic chronic kidney disease: Secondary | ICD-10-CM | POA: Insufficient documentation

## 2023-10-15 DIAGNOSIS — Z79899 Other long term (current) drug therapy: Secondary | ICD-10-CM | POA: Diagnosis not present

## 2023-10-15 DIAGNOSIS — E875 Hyperkalemia: Secondary | ICD-10-CM | POA: Diagnosis not present

## 2023-10-15 DIAGNOSIS — N189 Chronic kidney disease, unspecified: Secondary | ICD-10-CM | POA: Insufficient documentation

## 2023-10-15 DIAGNOSIS — R55 Syncope and collapse: Secondary | ICD-10-CM | POA: Diagnosis present

## 2023-10-15 LAB — URINALYSIS, ROUTINE W REFLEX MICROSCOPIC
Bilirubin Urine: NEGATIVE
Glucose, UA: NEGATIVE mg/dL
Hgb urine dipstick: NEGATIVE
Ketones, ur: NEGATIVE mg/dL
Nitrite: NEGATIVE
Protein, ur: NEGATIVE mg/dL
Specific Gravity, Urine: 1.009 (ref 1.005–1.030)
pH: 5.5 (ref 5.0–8.0)

## 2023-10-15 LAB — COMPREHENSIVE METABOLIC PANEL WITH GFR
ALT: 39 U/L (ref 0–44)
AST: 34 U/L (ref 15–41)
Albumin: 4.4 g/dL (ref 3.5–5.0)
Alkaline Phosphatase: 105 U/L (ref 38–126)
Anion gap: 13 (ref 5–15)
BUN: 57 mg/dL — ABNORMAL HIGH (ref 8–23)
CO2: 24 mmol/L (ref 22–32)
Calcium: 9.9 mg/dL (ref 8.9–10.3)
Chloride: 105 mmol/L (ref 98–111)
Creatinine, Ser: 2.12 mg/dL — ABNORMAL HIGH (ref 0.44–1.00)
GFR, Estimated: 22 mL/min — ABNORMAL LOW (ref >60)
Glucose, Bld: 147 mg/dL — ABNORMAL HIGH (ref 70–99)
Potassium: 5.3 mmol/L — ABNORMAL HIGH (ref 3.5–5.1)
Sodium: 143 mmol/L (ref 135–145)
Total Bilirubin: 0.3 mg/dL (ref 0.0–1.2)
Total Protein: 7.7 g/dL (ref 6.5–8.1)

## 2023-10-15 LAB — CBC
HCT: 39.4 % (ref 36.0–46.0)
Hemoglobin: 12.7 g/dL (ref 12.0–15.0)
MCH: 30.5 pg (ref 26.0–34.0)
MCHC: 32.2 g/dL (ref 30.0–36.0)
MCV: 94.7 fL (ref 80.0–100.0)
Platelets: 225 K/uL (ref 150–400)
RBC: 4.16 MIL/uL (ref 3.87–5.11)
RDW: 14.6 % (ref 11.5–15.5)
WBC: 10.9 K/uL — ABNORMAL HIGH (ref 4.0–10.5)
nRBC: 0 % (ref 0.0–0.2)

## 2023-10-15 LAB — CBG MONITORING, ED: Glucose-Capillary: 130 mg/dL — ABNORMAL HIGH (ref 70–99)

## 2023-10-15 NOTE — Telephone Encounter (Signed)
 Spoke to patient's daughter, Apolinar. Apolinar states since July 23rd, patient has had her 3rd vasovagal syncopal episode. Patient had an episode this morning. Patient did not lose consciousness but she had decreased alertness during episodes. Started sweating and felt weak. Blood pressure was 63/? Unable to give diastolic number at the time of the episode. Patient stated she did not have any symptoms prior to episode, it came suddenly while transferring to commode. I advised daughter to take patient to ER for evaluation. Patient's daughter verbalized understanding. Patient's daughter would like to keep appt with Dr. Sheena tomorrow in case patient is able to still make it.   Josie RN

## 2023-10-15 NOTE — ED Provider Notes (Signed)
 [place] EMERGENCY DEPARTMENT AT [facility] Provider Note   CSN: [id number] Arrival date & time: 10/15/23  1640     Patient presents with: Near Syncope   [name] [name] is a 89 y.o. female.  {Add pertinent medical, surgical, social history, OB history to YEP:67052} Patient to ED for evaluation of near-syncopal episodes over the last 6 weeks. There have been 3 episodes, one late July, the second early August and a third one today. Daughter-in-law provides most of the history. All episodes have occurred while the patient was sitting on the toilet, however, not with straining or valsalva. She becomes diaphoretic, lightheaded and slumps with slurring of her words. The episodes last about 30 minutes before she returns to her baseline. She denies pain at any time. Daughter-in-law states that EMS has been called for each episode and she has been found to have a low blood pressure on each occasion. Today, her blood pressure was checked just prior to going to the bathroom by caregiver and was normal. During today's episode it was found to be systolic 63.   The history is provided by the patient. No language interpreter was used.  Near Syncope       Prior to Admission medications   Medication Sig Start Date End Date Taking? Authorizing Provider  acetaminophen  (TYLENOL ) 500 MG tablet Take 1,000 mg by mouth 2 (two) times daily.    [provider]  albuterol  (PROVENTIL ) (2.5 MG/3ML) 0.083% nebulizer solution Take 3 mLs (2.5 mg total) by nebulization every 4 (four) hours as needed for wheezing or shortness of breath (per home regimen). 11/04/22   Leotis Bogus, MD  albuterol  (VENTOLIN  HFA) 108 [phone] Base) MCG/ACT inhaler Inhale 2 puffs into the lungs every 6 (six) hours as needed for wheezing or shortness of breath.    [provider]  amantadine  (SYMMETREL ) 100 MG capsule Take 1 capsule (100 mg total) by mouth daily. 02/12/20   Love, Sharlet RAMAN, PA-C  amLODipine  (NORVASC )  5 MG tablet Take 1 tablet (5 mg total) by mouth daily. 11/13/22 02/11/23  Tobb, Kardie, DO  amoxicillin-clavulanate (AUGMENTIN) 875-125 MG tablet Take 1 tablet by mouth 2 (two) times daily. Patient not taking: Reported on 07/26/2023 03/23/23   [provider]  ASPIRIN  81 PO Take 81 mg by mouth in the morning. 10/07/21   [provider]  atorvastatin  (LIPITOR ) 40 MG tablet Take 1 tablet (40 mg total) by mouth daily. 04/13/20   Whitfield Raisin, NP  B Complex-C (B-COMPLEX WITH VITAMIN C ) tablet Take 1 tablet by mouth daily.    [provider]  bisacodyl  5 MG EC tablet Take 5 mg by mouth daily as needed for moderate constipation.    [provider]  carvedilol  (COREG ) 12.5 MG tablet Take 1 tablet (12.5 mg total) by mouth 2 (two) times daily with a meal. 11/13/22 12/13/22  Tobb, Kardie, DO  diclofenac  Sodium (VOLTAREN  ARTHRITIS PAIN) 1 % GEL Apply 2 g topically 4 (four) times daily. Patient taking differently: Apply 4 g topically 3 (three) times daily. Right knee 04/24/22   Raulkar, Sven SQUIBB, MD  ferrous sulfate 325 (65 FE) MG tablet Take 325 mg by mouth every other day.    [provider]  Glucerna (GLUCERNA) LIQD Take 237 mLs by mouth daily.    [provider]  glucose blood (ACCU-CHEK GUIDE) test strip 1 each by Other route in the morning, at noon, in the evening, and at bedtime.    [provider]  hydrALAZINE  (  APRESOLINE ) 100 MG tablet Take 1 tablet (100 mg total) by mouth 2 (two) times daily. 03/28/23   Tobb, Kardie, DO  insulin  lispro (HUMALOG) 100 UNIT/ML injection Inject 14-26 Units into the skin 4 (four) times daily.    [provider]  Insulin  Syringe-Needle U-100 31G X 5/16 1 ML MISC 1 application by Does not apply route 2 (two) times daily. 02/12/20   Love, Sharlet RAMAN, PA-C  ipratropium-albuterol  (DUONEB) 0.5-2.5 (3) MG/3ML SOLN Take 3 mLs by nebulization as needed. 11/05/22   [provider]  LANTUS  SOLOSTAR 100 UNIT/ML  Solostar Pen Inject 10 Units into the skin 2 (two) times daily. Patient taking differently: Inject 19 Units into the skin 2 (two) times daily. 09/22/22   Cheryle Page, MD  lidocaine  (LIDODERM ) 5 % APPLY (1) PATCH TO LOWER BACK AND RIGHT KNEE ONCE DAILY. LEAVE ON 12 HOURS. LEAVE OFF 12 HOURS. Patient taking differently: Place 1 patch onto the skin See admin instructions. Apply 1 patch to the lower back and right knee once a day- on 12 hours/off 12 hours 03/29/22   Raulkar, Sven SQUIBB, MD  loratadine (CLARITIN) 10 MG tablet Take 10 mg by mouth daily.    [provider]  methocarbamol  (ROBAXIN ) 500 MG tablet Take 250 mg by mouth at bedtime as needed for muscle spasms.    [provider]  pantoprazole  (PROTONIX ) 40 MG tablet Take 1 tablet (40 mg total) by mouth daily. 02/12/20 10/28/22  Love, Sharlet RAMAN, PA-C  polyethylene glycol (MIRALAX  / GLYCOLAX ) 17 g packet Take 17 g by mouth 2 (two) times daily as needed for mild constipation or moderate constipation. 09/22/22   Cheryle Page, MD  QUEtiapine  (SEROQUEL ) 25 MG tablet Take 0.5 tablets (12.5 mg total) by mouth at bedtime as needed (anxiety). 03/23/20   Debby Fidela CROME, NP  senna-docusate (SENOKOT-S) 8.6-50 MG tablet Take 1 tablet by mouth 2 (two) times daily.    [provider]  sodium hypochlorite (DAKIN'S 1/4 STRENGTH) 0.125 % SOLN Irrigate with 1 Application as directed every Monday, Wednesday, and Friday. Patient not taking: Reported on 07/26/2023    [provider]  spironolactone  (ALDACTONE ) 25 MG tablet Take 1 tablet (25 mg total) by mouth daily. 11/13/22 12/13/22  Tobb, Kardie, DO  torsemide  (DEMADEX ) 20 MG tablet Take 1 tablet (20 mg total) by mouth every other day. 08/08/23   Tobb, Kardie, DO  traMADol (ULTRAM) 50 MG tablet Take 50 mg by mouth at bedtime as needed for moderate pain.    [provider]  traMADol-acetaminophen  (ULTRACET) 37.5-325 MG tablet Take 1 tablet by mouth 2 (two) times daily.     [provider]    Allergies: Amlodipine , Ace inhibitors, Atorvastatin , Canagliflozin, Clonidine  hcl, Buprenorphine, Lisinopril, and Morphine and codeine    Review of Systems  Cardiovascular:  Positive for near-syncope.    Updated Vital Signs BP (!) 166/57   Pulse 71   Temp 98 F (36.7 C)   Resp 18   SpO2 100%   Physical Exam Vitals and nursing note reviewed.  Constitutional:      Appearance: She is well-developed.  HENT:     Head: Normocephalic.  Neck:     Vascular: No carotid bruit.  Cardiovascular:     Rate and Rhythm: Normal rate and regular rhythm.     Heart sounds: No murmur heard. Pulmonary:     Effort: Pulmonary effort is normal.     Breath sounds: Normal breath sounds. No wheezing, rhonchi or rales.  Abdominal:  General: Bowel sounds are normal.     Palpations: Abdomen is soft.     Tenderness: There is no abdominal tenderness. There is no guarding or rebound.  Musculoskeletal:        General: Normal range of motion.     Cervical back: Normal range of motion and neck supple.  Skin:    General: Skin is warm and dry.  Neurological:     General: No focal deficit present.     Mental Status: She is alert and oriented to person, place, and time.     GCS: GCS eye subscore is 4. GCS verbal subscore is 5. GCS motor subscore is 6.     Cranial Nerves: Cranial nerves 2-12 are intact. No facial asymmetry.     Motor: No pronator drift.     Comments: Minimal right sided sensory and strength deficits to right upper extremity, chronic/unchanged.      (all labs ordered are listed, but only abnormal results are displayed) Labs Reviewed  COMPREHENSIVE METABOLIC PANEL WITH GFR - Abnormal; Notable for the following components:      Result Value   Potassium 5.3 (*)    Glucose, Bld 147 (*)    BUN 57 (*)    Creatinine, Ser 2.12 (*)    GFR, Estimated 22 (*)    All other components within normal limits  CBC - Abnormal; Notable for the following components:   WBC  10.9 (*)    All other components within normal limits  URINALYSIS, ROUTINE W REFLEX MICROSCOPIC - Abnormal; Notable for the following components:   Leukocytes,Ua MODERATE (*)    Bacteria, UA FEW (*)    All other components within normal limits  CBG MONITORING, ED - Abnormal; Notable for the following components:   Glucose-Capillary 130 (*)    All other components within normal limits    EKG: EKG Interpretation Date/Time:  Monday October 15 2023 17:00:04 EDT Ventricular Rate:  67 PR Interval:  176 QRS Duration:  82 QT Interval:  436 QTC Calculation: 460 R Axis:   19  Text Interpretation: Normal sinus rhythm Normal ECG When compared with ECG of 26-Jul-2023 11:46, Sinus rhythm has replaced Junctional rhythm T wave inversion now evident in Inferior leads Nonspecific T wave abnormality no longer evident in Lateral leads Confirmed by Jerrol Agent ([id number]) on 10/15/2023 5:14:45 PM  Radiology: No results found.  {Document cardiac monitor, telemetry assessment procedure when appropriate:32947} Procedures   Medications Ordered in the ED - No data to display    {Click here for ABCD2, HEART and other calculators REFRESH Note before signing:1}                              Medical Decision Making Amount and/or Complexity of Data Reviewed Labs: ordered.   ***  {Document critical care time when appropriate  Document review of labs and clinical decision tools ie CHADS2VASC2, etc  Document your independent review of radiology images and any outside records  Document your discussion with family members, caretakers and with consultants  Document social determinants of health affecting pt's care  Document your decision making why or why not admission, treatments were needed:32947:::1}   Final diagnoses:  Vasovagal syncope    ED Discharge Orders     None

## 2023-10-15 NOTE — Discharge Instructions (Addendum)
 As we discussed, your labs are at baseline for your usual values, your EKG does not have any acute changes, and your exam is reassuring. You can be discharged home and follow up with cardiology tomorrow as scheduled.   Return to the ED with any new or concerning symptoms at any time.

## 2023-10-15 NOTE — ED Notes (Signed)
 Pt given discharge instructions. Opportunities given for questions. IV removed. Pt stable at time of discharge.

## 2023-10-15 NOTE — ED Triage Notes (Signed)
 Having vagal episodes x 1 month ( 3 episodes) Near syncopal  Was found slumped with slurred speech Seen by ems advised to be evaluated in ED  Normal per son now Hx stroke with right side def

## 2023-10-15 NOTE — Telephone Encounter (Signed)
 Pt c/o Syncope: STAT if syncope occurred within 24 hours and pt complains of lightheadedness  Did you pass out today?  Yes    When is the last time you passed out?  About 1 hour   Has this occurred multiple times?  3 fainting spells since 7/24  Did you have any symptoms prior to passing out?  Daughter unsure, but   5. Did you fall? If so, are you on a blood thinner? Patient did not fall. It occurred while patient was on the toilet. Called EMS but patient refused to go to the ED.

## 2023-10-16 ENCOUNTER — Ambulatory Visit: Attending: Cardiology | Admitting: Cardiology

## 2023-10-16 VITALS — BP 171/72 | HR 81 | Ht 66.0 in | Wt 184.6 lb

## 2023-10-16 DIAGNOSIS — I5032 Chronic diastolic (congestive) heart failure: Secondary | ICD-10-CM | POA: Diagnosis not present

## 2023-10-16 DIAGNOSIS — Z79899 Other long term (current) drug therapy: Secondary | ICD-10-CM

## 2023-10-16 DIAGNOSIS — I951 Orthostatic hypotension: Secondary | ICD-10-CM

## 2023-10-16 MED ORDER — HYDRALAZINE HCL 50 MG PO TABS: 50.0000 mg | ORAL_TABLET | Freq: Two times a day (BID) | ORAL | 3 refills | Status: DC

## 2023-10-16 NOTE — Patient Instructions (Addendum)
 Medication Instructions:  Your physician has recommended you make the following change in your medication:  STOP: Aldactone  DECREASE: Hydralazine  50 mg twice daily  *If you need a refill on your cardiac medications before your next appointment, please call your pharmacy*  Lab Work: IN 2 WEEKS: CMET, Mag If you have labs (blood work) drawn today and your tests are completely normal, you will receive your results only by: MyChart Message (if you have MyChart) OR A paper copy in the mail If you have any lab test that is abnormal or we need to change your treatment, we will call you to review the results.   Follow-Up: At [facility], you and your health needs are our priority.  As part of our continuing mission to provide you with exceptional heart care, our providers are all part of one team.  This team includes your primary Cardiologist (physician) and Advanced Practice Providers or APPs (Physician Assistants and Nurse Practitioners) who all work together to provide you with the care you need, when you need it.  Your next appointment:   12 week(s)  Provider:   APP or Dr. Sheena

## 2023-10-16 NOTE — Progress Notes (Unsigned)
 Cardiology Office Note:    Date:  10/16/2023   ID:  [name], [name] 28-Jun-1932, MRN [id number]  PCP:  Pura Lenis, MD  Cardiologist:  Dub Huntsman, DO  Electrophysiologist:  None   Referring MD: Pura Lenis, MD   No chief complaint on file.   History of Present Illness:    [name] [name] is a 89 y.o. female with a hx of chronic diastolic hypertension, diabetes mellitus type 2, CVA, dyslipidemia, CKD 3B, obesity, hypertension here today for follow-up visit.   Discussed the use of AI scribe software for clinical note transcription with the patient, who gave verbal consent to proceed  Her last visit with me I got a report from family members that she had passed out.  Her blood pressure dropped with systolic in the 60s but was also suspected to be vasovagal syncope.  She is here today for follow-up visit.  She was seen in [facility] yesterday I was able to review her labs which noted hyperkalemia  No other complaints at this time.   Past Medical History:  Diagnosis Date   Colon polyp    Diabetes mellitus 1992   Hypertension 1996   Pancreatitis     Past Surgical History:  Procedure Laterality Date   CHOLECYSTECTOMY     COLONOSCOPY     ERCP     INCISION AND DRAINAGE ABSCESS N/A 09/12/2022   Procedure: INCISION AND DRAINAGE ABSCESS;  Surgeon: Sheldon Standing, MD;  Location: [facility] ORS;  Service: General;  Laterality: N/A;   PARTIAL HYSTERECTOMY  89years old   POLYPECTOMY     UPPER GASTROINTESTINAL ENDOSCOPY     WRIST FRACTURE SURGERY  left arm,89years old    Current Medications: No outpatient medications have been marked as taking for the 10/16/23 encounter (Appointment) with Josh Nicolosi, DO.     Allergies:   Amlodipine , Ace inhibitors, Atorvastatin , Canagliflozin, Clonidine  hcl, Buprenorphine, Lisinopril, and Morphine and codeine   Social History   Socioeconomic History   Marital status: Widowed    Spouse name: Not on file   Number of children: Not on file    Years of education: Not on file   Highest education level: Not on file  Occupational History   Not on file  Tobacco Use   Smoking status: Never   Smokeless tobacco: Never  Vaping Use   Vaping status: Never Used  Substance and Sexual Activity   Alcohol use: Never   Drug use: Never   Sexual activity: Not on file  Other Topics Concern   Not on file  Social History Narrative   Not on file   Social Drivers of Health   Financial Resource Strain: Low Risk  (06/04/2023)   Received from [facility]   Overall Financial Resource Strain (CARDIA)    Difficulty of Paying Living Expenses: Not hard at all  Food Insecurity: No Food Insecurity (06/04/2023)   Received from [facility]   Hunger Vital Sign    Within the past 12 months, you worried that your food would run out before you got the money to buy more.: Never true    Within the past 12 months, the food you bought just didn't last and you didn't have money to get more.: Never true  Transportation Needs: No Transportation Needs (06/04/2023)   Received from [facility] - Transportation    Lack of Transportation (Medical): No    Lack of Transportation (Non-Medical): No  Physical Activity: Unknown (06/04/2023)   Received from [facility]  Exercise Vital Sign    On average, how many days per week do you engage in moderate to strenuous exercise (like a brisk walk)?: 0 days    Minutes of Exercise per Session: Not on file  Stress: No Stress Concern Present (06/04/2023)   Received from [facility] - Occupational Stress Questionnaire    Feeling of Stress : Only a little  Social Connections: Socially Integrated (06/04/2023)   Received from [facility]   Social Network    How would you rate your social network (family, work, friends)?: Good participation with social networks     Family History: The patient's family history is not on file.  ROS:   Review of Systems  Constitution:  Negative for decreased appetite, fever and weight gain.  HENT: Negative for congestion, ear discharge, hoarse voice and sore throat.   Eyes: Negative for discharge, redness, vision loss in right eye and visual halos.  Cardiovascular: Negative for chest pain, dyspnea on exertion, leg swelling, orthopnea and palpitations.  Respiratory: Negative for cough, hemoptysis, shortness of breath and snoring.   Endocrine: Negative for heat intolerance and polyphagia.  Hematologic/Lymphatic: Negative for bleeding problem. Does not bruise/bleed easily.  Skin: Negative for flushing, nail changes, rash and suspicious lesions.  Musculoskeletal: Negative for arthritis, joint pain, muscle cramps, myalgias, neck pain and stiffness.  Gastrointestinal: Negative for abdominal pain, bowel incontinence, diarrhea and excessive appetite.  Genitourinary: Negative for decreased libido, genital sores and incomplete emptying.  Neurological: Negative for brief paralysis, focal weakness, headaches and loss of balance.  Psychiatric/Behavioral: Negative for altered mental status, depression and suicidal ideas.  Allergic/Immunologic: Negative for HIV exposure and persistent infections.    EKGs/Labs/Other Studies Reviewed:    The following studies were reviewed today:   EKG:  The ekg ordered today demonstrates   Recent Labs: 11/03/2022: B Natriuretic Peptide 440.7 07/26/2023: Magnesium  2.1 10/15/2023: ALT 39; BUN 57; Creatinine, Ser 2.12; Hemoglobin 12.7; Platelets 225; Potassium 5.3; Sodium 143  Recent Lipid Panel    Component Value Date/Time   CHOL 201 (H) 01/04/2020 1601   TRIG 398 (H) 01/06/2020 0512   HDL 37 (L) 01/04/2020 1601   CHOLHDL 5.4 01/04/2020 1601   VLDL UNABLE TO CALCULATE IF TRIGLYCERIDE OVER 400 mg/dL 88/92/7978 8398   LDLCALC UNABLE TO CALCULATE IF TRIGLYCERIDE OVER 400 mg/dL 88/92/7978 8398   LDLDIRECT 121.8 (H) 01/04/2020 1601    Physical Exam:    VS:  There were no vitals taken for this visit.     Wt Readings from Last 3 Encounters:  07/26/23 178 lb 6.4 oz (80.9 kg)  03/28/23 177 lb 4.8 oz (80.4 kg)  12/27/22 186 lb 3.2 oz (84.5 kg)     GEN: Well nourished, well developed in no acute distress HEENT: Normal NECK: No JVD; No carotid bruits LYMPHATICS: No lymphadenopathy CARDIAC: S1S2 noted,RRR, no murmurs, rubs, gallops RESPIRATORY:  Clear to auscultation without rales, wheezing or rhonchi  ABDOMEN: Soft, non-tender, non-distended, +bowel sounds, no guarding. EXTREMITIES: No edema, No cyanosis, no clubbing MUSCULOSKELETAL:  No deformity  SKIN: Warm and dry NEUROLOGIC:  Alert and oriented x 3, non-focal PSYCHIATRIC:  Normal affect, good insight  ASSESSMENT:    No diagnosis found. PLAN:     1.  Orthostatic hypotension-in the office she did have evidence of orthostatic hypotension with blood pressure dropping when she stood up.  At this time I would like to cut back on her diuretic stop the Aldactone  continue her Demadex .  Cut down  on hydralazine  to 50 mg twice daily.  But will continue other antihypertensive medications.  There may be also some contribution from vasovagal effect but we will continue to monitor closely.  2.  Chronic diastolic heart failure-she does not appear to be clinically volume overloaded we will continue her current dose of torsemide .  Aldactone  be stopped as noted above  3. PVC - symptomatic   4. CKD  avoid toxins    The patient is in agreement with the above plan. The patient left the office in stable condition.  The patient will follow up in   Medication Adjustments/Labs and Tests Ordered: Current medicines are reviewed at length with the patient today.  Concerns regarding medicines are outlined above.  No orders of the defined types were placed in this encounter.  No orders of the defined types were placed in this encounter.   There are no Patient Instructions on file for this visit.   Adopting a Healthy Lifestyle.  Know what a healthy  weight is for you (roughly BMI <25) and aim to maintain this   Aim for 7+ servings of fruits and vegetables daily   65-80+ fluid ounces of water  or unsweet tea for healthy kidneys   Limit to max 1 drink of alcohol per day; avoid smoking/tobacco   Limit animal fats in diet for cholesterol and heart health - choose grass fed whenever available   Avoid highly processed foods, and foods high in saturated/trans fats   Aim for low stress - take time to unwind and care for your mental health   Aim for 150 min of moderate intensity exercise weekly for heart health, and weights twice weekly for bone health   Aim for 7-9 hours of sleep daily   When it comes to diets, agreement about the perfect plan isnt easy to find, even among the experts. Experts at the [facility] of [employer] developed an idea known as the Healthy Eating Plate. Just imagine a plate divided into logical, healthy portions.   The emphasis is on diet quality:   Load up on vegetables and fruits - one-half of your plate: Aim for color and variety, and remember that potatoes dont count.   Go for whole grains - one-quarter of your plate: Whole wheat, barley, wheat berries, quinoa, oats, brown rice, and foods made with them. If you want pasta, go with whole wheat pasta.   Protein power - one-quarter of your plate: Fish, chicken, beans, and nuts are all healthy, versatile protein sources. Limit red meat.   The diet, however, does go beyond the plate, offering a few other suggestions.   Use healthy plant oils, such as olive, canola, soy, corn, sunflower and peanut. Check the labels, and avoid partially hydrogenated oil, which have unhealthy trans fats.   If youre thirsty, drink water . Coffee and tea are good in moderation, but skip sugary drinks and limit milk and dairy products to one or two daily servings.   The type of carbohydrate in the diet is more important than the amount. Some sources of carbohydrates, such as  vegetables, fruits, whole grains, and beans-are healthier than others.   Finally, stay active  Signed, Dub Huntsman, DO  10/16/2023 10:05 AM    [facility]

## 2023-10-30 ENCOUNTER — Encounter: Payer: Self-pay | Admitting: Cardiology

## 2023-10-31 LAB — COMPREHENSIVE METABOLIC PANEL WITH GFR
ALT: 41 IU/L — ABNORMAL HIGH (ref 0–32)
AST: 33 IU/L (ref 0–40)
Albumin: 4 g/dL (ref 3.7–4.7)
Alkaline Phosphatase: 104 IU/L (ref 44–121)
BUN/Creatinine Ratio: 23 (ref 12–28)
BUN: 41 mg/dL — ABNORMAL HIGH (ref 8–27)
Bilirubin Total: 0.3 mg/dL (ref 0.0–1.2)
CO2: 27 mmol/L (ref 20–29)
Calcium: 8.7 mg/dL (ref 8.7–10.3)
Chloride: 103 mmol/L (ref 96–106)
Creatinine, Ser: 1.81 mg/dL — ABNORMAL HIGH (ref 0.57–1.00)
Globulin, Total: 2.5 g/dL (ref 1.5–4.5)
Glucose: 214 mg/dL — ABNORMAL HIGH (ref 70–99)
Potassium: 4.9 mmol/L (ref 3.5–5.2)
Sodium: 144 mmol/L (ref 134–144)
Total Protein: 6.5 g/dL (ref 6.0–8.5)
eGFR: 26 mL/min/1.73 — ABNORMAL LOW (ref >59)

## 2023-10-31 LAB — MAGNESIUM: Magnesium: 1.8 mg/dL (ref 1.6–2.3)

## 2023-11-01 ENCOUNTER — Other Ambulatory Visit: Payer: Self-pay

## 2023-11-01 MED ORDER — TORSEMIDE 20 MG PO TABS: 20.0000 mg | ORAL_TABLET | ORAL | 3 refills | Status: DC

## 2023-11-01 NOTE — Progress Notes (Signed)
 Medication list updated. Prescription sent to pharmacy.

## 2023-11-02 ENCOUNTER — Other Ambulatory Visit: Payer: Self-pay | Admitting: Cardiology

## 2023-11-03 ENCOUNTER — Other Ambulatory Visit: Payer: Self-pay | Admitting: Cardiology

## 2023-11-06 ENCOUNTER — Encounter: Payer: Self-pay | Admitting: Cardiology

## 2023-11-07 MED ORDER — CARVEDILOL 12.5 MG PO TABS: 12.5000 mg | ORAL_TABLET | Freq: Two times a day (BID) | ORAL | 3 refills | Status: AC

## 2023-11-16 ENCOUNTER — Ambulatory Visit: Payer: Self-pay | Admitting: Cardiology

## 2023-11-21 ENCOUNTER — Ambulatory Visit: Admitting: Cardiology

## 2024-01-09 NOTE — Progress Notes (Signed)
 Cardiology Office Note   Date:  01/10/2024  ID:  [name], [name] 03-Jan-1933, MRN [id number] PCP: Pura Lenis, MD  [facility] Providers Cardiologist:  Dub Huntsman, DO   History of Present Illness [name] [name] is a 89 y.o. female with a past medical history of chronic diastolic hypertension, diabetes mellitus type 2, CVA, dyslipidemia, CKD 3B, obesity, hypertension who is here for follow-up appointment.  She was last seen October 16, 2023 by Dr. Huntsman and at that visit her blood pressure had dropped down to a systolic of the 60s and she had lost consciousness at that time.  Suspected to be vasovagal syncope.  Notably, her recent lab work revealed hyperkalemia.  No other complaints at that time.  Her hydralazine  was cut down to 50 mg twice a day and her diuretics were cut back (She stopped Aldactone  and continue Demadex ).  Encouraged to continue to track her blood pressure closely.  She does have a history of PVCs but otherwise no other rhythm issues.  Recommendations include adequate sleep, appropriate nutrition, and increasing exercise.  Today, she presents with a hx of hypertension and diastolic dysfunction who presents for a cardiovascular checkup.  Blood pressure has been fluctuating, with recent readings as high as 190 mmHg. Episodes of lightheadedness and dizziness occur, initially thought to be related to low blood pressure. Spironolactone  was discontinued due to concerns about low blood pressure. Current medications include torsemide  20 mg five days a week, amlodipine , carvedilol , and hydralazine  50 mg twice daily.  Weight fluctuates between 188 and 196 pounds, with increased swelling, particularly in one leg due to a bad knee. A history of stroke postponed a planned knee replacement surgery. No episodes of atrial fibrillation or other arrhythmias are reported.  She needs to have her BP checked AFTER medications in the morning and evening, once at lunch.  She stays with  her son, previously at a care facility.  She has a home health nurse that comes in to take her vitals and to give her her medication.  Reports no shortness of breath nor dyspnea on exertion. Reports no chest pain, pressure, or tightness. No orthopnea, PND. Reports no palpitations.   Discussed the use of AI scribe software for clinical note transcription with the patient, who gave verbal consent to proceed.   ROS: Pertinent ROS in HPI  Studies Reviewed     Echo 09/12/22  Sonographer Comments: Patient is obese. Image acquisition challenging due  to patient body habitus and Image acquisition challenging due to  respiratory motion.  IMPRESSIONS     1. Left ventricular ejection fraction, by estimation, is 60 to 65%. The  left ventricle has normal function. The left ventricle has no regional  wall motion abnormalities. There is mild concentric left ventricular  hypertrophy. Left ventricular diastolic  parameters are consistent with Grade II diastolic dysfunction  (pseudonormalization).   2. Right ventricular systolic function is normal. The right ventricular  size is normal. There is mildly elevated pulmonary artery systolic  pressure. The estimated right ventricular systolic pressure is 39.8 mmHg.   3. Left atrial size was moderately dilated.   4. Right atrial size was mild to moderately dilated.   5. The mitral valve is degenerative. Mild mitral valve regurgitation. No  evidence of mitral stenosis. Moderate mitral annular calcification.   6. The aortic valve is tricuspid. There is mild calcification of the  aortic valve. Aortic valve regurgitation is trivial. Aortic valve  sclerosis/calcification is present, without any evidence of aortic  stenosis.   7. The inferior vena cava is dilated in size with >50% respiratory  variability, suggesting right atrial pressure of 8 mmHg.   Conclusion(s)/Recommendation(s): No evidence of valvular vegetations on  this transthoracic echocardiogram.  Consider a transesophageal  echocardiogram to exclude infective endocarditis if clinically indicated.       Physical Exam VS:  BP (!) 162/62   Pulse 73   Ht 5' 6 (1.676 m)   Wt 195 lb (88.5 kg)   SpO2 97%   BMI 31.47 kg/m        Wt Readings from Last 3 Encounters:  01/10/24 195 lb (88.5 kg)  10/16/23 184 lb 9.6 oz (83.7 kg)  07/26/23 178 lb 6.4 oz (80.9 kg)    GEN: Well nourished, well developed in no acute distress NECK: No JVD; No carotid bruits CARDIAC: RRR, no murmurs, rubs, gallops RESPIRATORY:  Clear to auscultation without rales, wheezing or rhonchi  ABDOMEN: Soft, non-tender, non-distended EXTREMITIES:  No edema; No deformity   ASSESSMENT AND PLAN  Hypertension with diastolic heart failure (heart failure with preserved ejection fraction) Blood pressure elevated to 190 mmHg. Diastolic dysfunction with impaired relaxation. Spironolactone  previously discontinued due to hypotension and syncope. Stroke risk due to hypertension. - Added 12.5 mg spironolactone  for fluid and blood pressure control. - Continue torsemide  20 mg five days a week. - Increase hydralazine  to 50 mg twice daily, with an additional 50 mg dose as needed if blood pressure exceeds 165 mmHg. - Monitor blood pressure consistently, ideally one hour after medication administration. - Aim for weight stabilization around 190 lbs. - Limit sodium intake.  Chronic kidney disease, unspecified Creatinine level improved to 1.8 from over 2.0. Kidney function monitored by nephrologist. - Continue monitoring kidney function with nephrologist.  Chronic right knee pain and swelling due to severe osteoarthritis Severe osteoarthritis with significant swelling. Previous steroid injections caused severe wound infection. - Continue Voltaren  for pain management. - Elevate the leg to reduce swelling. - Avoid steroid injections due to previous complications.  Stable mild mitral valve regurgitation Mild regurgitation with  calcium  buildup.   Dispo: She can follow-up in 3-4 months with Dr. Sheena or myself.   Signed, Orren LOISE Fabry, PA-C

## 2024-01-10 ENCOUNTER — Ambulatory Visit: Attending: Physician Assistant | Admitting: Physician Assistant

## 2024-01-10 VITALS — BP 162/62 | HR 73 | Ht 66.0 in | Wt 195.0 lb

## 2024-01-10 DIAGNOSIS — N1832 Chronic kidney disease, stage 3b: Secondary | ICD-10-CM

## 2024-01-10 DIAGNOSIS — Z79899 Other long term (current) drug therapy: Secondary | ICD-10-CM

## 2024-01-10 DIAGNOSIS — I5033 Acute on chronic diastolic (congestive) heart failure: Secondary | ICD-10-CM

## 2024-01-10 DIAGNOSIS — I10 Essential (primary) hypertension: Secondary | ICD-10-CM | POA: Diagnosis not present

## 2024-01-10 DIAGNOSIS — I340 Nonrheumatic mitral (valve) insufficiency: Secondary | ICD-10-CM

## 2024-01-10 DIAGNOSIS — E782 Mixed hyperlipidemia: Secondary | ICD-10-CM

## 2024-01-10 MED ORDER — HYDRALAZINE HCL 50 MG PO TABS: 50.0000 mg | ORAL_TABLET | Freq: Two times a day (BID) | ORAL | 1 refills | Status: DC

## 2024-01-10 MED ORDER — SPIRONOLACTONE 25 MG PO TABS: 12.5000 mg | ORAL_TABLET | Freq: Every day | ORAL | 1 refills | Status: AC

## 2024-01-10 NOTE — Patient Instructions (Addendum)
 Medication Instructions:  RESTART Spironolactone  12.5mg  Break 25mg  tablet in half and take half tablet daily You can take an additional 50mg  of Hydralazine  as needed if your blood pressure is over 165. *If you need a refill on your cardiac medications before your next appointment, please call your pharmacy*  Lab Work: NONE ORDERED If you have labs (blood work) drawn today and your tests are completely normal, you will receive your results only by: MyChart Message (if you have MyChart) OR A paper copy in the mail If you have any lab test that is abnormal or we need to change your treatment, we will call you to review the results.  Testing/Procedures: NONE ORDERED  Follow-Up: At [facility], you and your health needs are our priority.  As part of our continuing mission to provide you with exceptional heart care, our providers are all part of one team.  This team includes your primary Cardiologist (physician) and Advanced Practice Providers or APPs (Physician Assistants and Nurse Practitioners) who all work together to provide you with the care you need, when you need it.  Your next appointment:   3-4 month(s)  Provider:   Kardie Tobb, DO or Orren Fabry, PA   We recommend signing up for the patient portal called MyChart.  Sign up information is provided on this After Visit Summary.  MyChart is used to connect with patients for Virtual Visits (Telemedicine).  Patients are able to view lab/test results, encounter notes, upcoming appointments, etc.  Non-urgent messages can be sent to your provider as well.   To learn more about what you can do with MyChart, go to [email or web address].   Other Instructions Please check your weight daily. Please contact the office if you gain more than 2lbs in a day or 5lbs in a week.  Limit your salt intake to 1500-2000mg  per day or 500mg  of Sodium per meal.  DO NOT STRAIN WHEN USING THE RESTROOM  Please check your blood pressure daily for  two weeks, then contact the office with your readings  Please contact the office with your readings either by phone, by dropping it off in person, or by sending it through MyChart.   Be sure to check your blood pressure one to two hours after taking your medications.  Avoid the following for 30 minutes before checking your blood pressure: No caffeine No alcohol No eating No smoking  No exercise  Five minutes before checking your blood pressure: Use the restroom Sit up straight in a chair with your back supported and feet flat on the floor Remain quiet and do not talk

## 2024-01-28 ENCOUNTER — Encounter: Payer: Self-pay | Admitting: Physician Assistant

## 2024-01-30 MED ORDER — HYDRALAZINE HCL 50 MG PO TABS: ORAL_TABLET | ORAL | Status: AC

## 2024-03-12 LAB — LAB REPORT - SCANNED: EGFR: 25

## 2024-03-17 ENCOUNTER — Other Ambulatory Visit: Payer: Self-pay

## 2024-03-17 NOTE — Telephone Encounter (Signed)
 Pt states that she now takes 1 tablet daily

## 2024-03-24 MED ORDER — TORSEMIDE 20 MG PO TABS: 20.0000 mg | ORAL_TABLET | ORAL | 3 refills | Status: AC

## 2024-03-24 NOTE — Telephone Encounter (Signed)
 Labs on 03/15/24  abnormal  In accordance with refill protocols, please review and address the following requirements before this medication refill can be authorized:  Labs  and Vital signs

## 2024-03-31 ENCOUNTER — Encounter: Payer: Self-pay | Admitting: Physician Assistant

## 2024-03-31 MED ORDER — TORSEMIDE 20 MG PO TABS: ORAL_TABLET | ORAL | 0 refills | Status: AC

## 2024-04-30 ENCOUNTER — Ambulatory Visit: Admitting: Cardiology
